# Patient Record
Sex: Female | Born: 1945 | Race: Black or African American | Hispanic: No | Smoking: Current some day smoker
Health system: Southern US, Community
[De-identification: ages and names within clinical notes are randomized; demographics above are authoritative.]

## PROBLEM LIST (undated history)

## (undated) DIAGNOSIS — M1990 Unspecified osteoarthritis, unspecified site: Secondary | ICD-10-CM

## (undated) DIAGNOSIS — I10 Essential (primary) hypertension: Secondary | ICD-10-CM

## (undated) DIAGNOSIS — K219 Gastro-esophageal reflux disease without esophagitis: Secondary | ICD-10-CM

## (undated) DIAGNOSIS — C259 Malignant neoplasm of pancreas, unspecified: Secondary | ICD-10-CM

## (undated) HISTORY — PX: SPINE SURGERY: SHX786

## (undated) HISTORY — PX: BREAST LUMPECTOMY: SHX2

## (undated) HISTORY — DX: Gastro-esophageal reflux disease without esophagitis: K21.9

## (undated) HISTORY — DX: Essential (primary) hypertension: I10

## (undated) HISTORY — PX: DIAGNOSTIC LAPAROSCOPY: SUR761

## (undated) HISTORY — DX: Unspecified osteoarthritis, unspecified site: M19.90

## (undated) HISTORY — PX: COLON SURGERY: SHX602

## (undated) HISTORY — PX: CHOLECYSTECTOMY: SHX55

## (undated) HISTORY — PX: KNEE SURGERY: SHX244

## (undated) HISTORY — PX: BACK SURGERY: SHX140

## (undated) HISTORY — PX: OTHER SURGICAL HISTORY: SHX169

## (undated) HISTORY — PX: CATARACT EXTRACTION: SUR2

## (undated) HISTORY — PX: ABDOMINAL HYSTERECTOMY: SHX81

---

## 2007-07-22 ENCOUNTER — Ambulatory Visit: Admission: RE | Admit: 2007-07-22 | Discharge: 2007-07-22 | Payer: Self-pay | Admitting: Sports Medicine

## 2007-07-22 ENCOUNTER — Encounter (INDEPENDENT_AMBULATORY_CARE_PROVIDER_SITE_OTHER): Payer: Self-pay | Admitting: Sports Medicine

## 2007-07-22 ENCOUNTER — Ambulatory Visit: Payer: Self-pay | Admitting: Vascular Surgery

## 2007-09-01 ENCOUNTER — Ambulatory Visit: Payer: Self-pay | Admitting: Vascular Surgery

## 2008-06-11 ENCOUNTER — Emergency Department (HOSPITAL_COMMUNITY): Admission: EM | Admit: 2008-06-11 | Discharge: 2008-06-12 | Payer: Self-pay | Admitting: Emergency Medicine

## 2008-06-12 ENCOUNTER — Encounter (INDEPENDENT_AMBULATORY_CARE_PROVIDER_SITE_OTHER): Payer: Self-pay | Admitting: Emergency Medicine

## 2008-06-12 ENCOUNTER — Ambulatory Visit: Payer: Self-pay | Admitting: Vascular Surgery

## 2010-05-09 LAB — PROTIME-INR: Prothrombin Time: 18.1 seconds — ABNORMAL HIGH (ref 11.6–15.2)

## 2010-05-09 LAB — CBC
MCHC: 34.8 g/dL (ref 30.0–36.0)
MCV: 93.3 fL (ref 78.0–100.0)
Platelets: 165 10*3/uL (ref 150–400)
RDW: 14.8 % (ref 11.5–15.5)

## 2010-06-13 NOTE — Consult Note (Signed)
NAME:  [name], TEMPESTA NO.:  [id number]   MEDICAL RECORD NO.:  [id number]          PATIENT TYPE:  EMS   LOCATION:  MAJO                         FACILITY:  [facility]   PHYSICIAN:  Pearlean Brownie, M.D.DATE OF BIRTH:  Jun 11, 1945   DATE OF CONSULTATION:  06/12/2008  DATE OF DISCHARGE:                                 CONSULTATION   PRIMARY CARE PHYSICIAN:  The patient was seen at [place] last year in  July by Dr. Hal Hope.  She is also followed by Coumadin clinic.   CHIEF COMPLAINT:  Right lower extremity DVT/edema/tenderness.   HISTORY OF PRESENT ILLNESS:  The patient is a 65 year old female here  with history of DVT and PE who presented with right lower extremity  swelling and tenderness and a new DVT on her Doppler.  The patient says  she is currently on Coumadin and taking 15 mg per day.  The patient says  that she has been taking this at least for 2 weeks, but prior to that  had not been taking this.  Her last Coumadin fill was on March 30, 2008  where she got 90 tablets, 5 mg each, was told to take 3 per day.  This  would have lasted for approximately 1 month.  When the patient was  confronted with this information, she said that she had been taking 50  mg daily for the past 2 weeks everyday.  She was pressed upon this  information.  The patient has not had her INR checked in 2 months.  She  said last time it was checked at the [facility].  According to the patient, it was checked and found to be therapeutic.  The patient says that her [facility] has not been covering her INR  checks there and she needs to find another place to get this checked and  this is the reason that she has not been having her INR checked  regularly.  The patient admits to having pain in her right lower  extremity for approximately 2 days.  She has an increase in swelling  yesterday and she was unable to walk as well, but today she is not able  to walk to the bathroom  after getting oxycodone.  The patient says the  pain is now improved from 9/10 yesterday to 5-6/10 today.  The swelling  has also improved.  She denies fevers.  She says she does have some mild  shortness of breath when walks to the bathroom, has had this for 3-4  days.  Denies any chest pain.  She is coughing a little bit in the  morning for approximately 2-3 weeks and the patient also says that she  has given herself Lovenox in the past and says that she knows how to do  this and would be willing to do this at home when she needed to.  In the  emergency department, the patient was given Tylenol 650 mg, Lovenox 1  mg/kg x1, oxycodone 10 mg x1, Ambien 10 mg x1, doxycycline 100 mg x1.   PAST MEDICAL HISTORY:  1. GERD.  2. DVT in the right thigh 2 years ago in [place].  3. PE approximately 3 years ago in [place].  She was seen at      [facility] for this.  4. Accelerated heart rate.  The patient says she takes Cardizem for      this.  Denies having an arrhythmia.  Denies having any other heart      problems.  5. She was diagnosed with neuropathy in her feet.  She takes Lyrica      for this.  6. Glaucoma and cataracts.  7. Tobacco abuse 1-2 packs per day for 40 years.   PAST SURGICAL HISTORY:  The patient has had a Greenfield filter placed.  Also, has had her gallbladder removed, hysterectomy, and a lump removed  from her left breast as well as other arthroscopic surgeries in her  joints in the past.   ALLERGIES:  No known drug allergies.   MEDICATIONS:  1. Coumadin 15 mg daily.  2. Protonix 40 mg daily.  3. Cardizem CD 120 mg daily.  4. Lyrica 75 mg daily.  5. Travatan 1 drop nightly in each eye.   SOCIAL HISTORY:  The patient lives in [place].  Her daughter is 93  years old.  She has 2 grandchildren here, ages 98 and 57.  She does smoke  1 pack per day currently and has since decreased from 2 packs per day to  1 pack per day.  She is a social drinker.   She is retired from the  [employer], dispensing.   FAMILY HISTORY:  Denies any family history of blood clots.  Mom has  hypertension, died in her 45s of a heart problem, might have had a heart  attack.  Dad, hypertension, diabetes, prostate cancer.  Sister had some  kind of heart problem, also has hypertension and diabetes.  Grandmother,  hypertension and diabetes.   PHYSICAL EXAMINATION:  VITAL SIGNS:  Temperature 97.7, T-max 98.5, heart  rate 65-90, respiratory rate 16-18, blood pressure 109-143/70s-80s,  sating 95% on room air to 97% on room air.  The patient weighs 201.7  pounds.  GENERAL:  Alert, in no acute distress.  She is sitting in bed, reading  comfortably.  HEENT:  Pupils equal, round, reactive to light.  Extraocular muscles  intact.  Oropharynx, no exudates or erythema.  NECK:  No lymphadenopathy.  CARDIOVASCULAR:  Heart regular rate rhythm.  No murmurs, rubs, or  gallops.  PULMONARY:  Lungs are clear to auscultation bilaterally.  ABDOMEN:  Soft, nondistended.  Very mild tenderness in the left  umbilical area.  EXTREMITIES:  Left is nontender.  No edema.  Right tender with erythema  and swelling on the medial side of her lower leg and ankle 2+ dorsalis  pedis pulses bilaterally.  Sensation intact bilaterally.   LABORATORY DATA:  INR 1.4, white blood cell count 5.4, hemoglobin 14.4,  platelets 165, hematocrit 41.3.   STUDIES:  Right lower extremity Dopplers showed right lower extremity  DVT in PTV and a superficial venous thrombosis noted in the GSV in the  calf.   ASSESSMENT AND PLAN:  The patient is a 65 year old female with a history  of DVT and PE with a Greenfield filter, currently on Coumadin, but has  been noncompliant in the past year with another DVT.  In regards to DVT,  this most likely is due to noncompliance on her Coumadin.  She was not  taking this correctly.  The patient did tell  me she has been taking 50  mg per day at least for 2 weeks and I did  pressed her upon this  information and she told me she definitely has been taking it for 2  weeks and I also warned her that she did not tell me the truth it could  mean that she could have a hemorrhage if I give her too much Coumadin.  I did call [facility] in [address].  The patient did last fill her  prescription on March 30, 2008.  She was only given enough tablets at  that point to last 1 month if she took 50 mg per day; however, the  patient says that she was not taking this initially, but has been taking  this for the past 2 weeks.  The patient was given Lovenox 1 mg/kg last  night approximately at midnight, and we will give another dose now prior  to discharge as it is already approximately 2 p.m. and it has been about  12 hours.  We will also give her 20 mg of Coumadin today prior to  discharge as she was subtherapeutic on the 15 mg and should have steady  state by 5 days, so if she were taking it for 50 mg per day for 2 weeks,  she should be at a steady state and 20 mg should be an appropriate dose  for her.  I did give her a prescription for Coumadin for 5 mg tablets #8  to take 4 tablets of it tomorrow and enough for 4 tablets on Monday if  she ends up getting to a physician later in the day; however, I would  prefer the physician to dose this on Monday, and I did tell this to the  patient.  I also given her prescription for Lovenox for approximately 4  doses of 90 mg per dose and Lovenox supplies and a prescription for 20  of Vicodin p.r.n. pain.  The patient has given herself Lovenox before in  the past and does know how to take this.  We will have an RN watch her  give this to herself prior to discharge as she is due for a dose  currently.  She will follow up at 1:15 p.m. at [facility] with Dr. Hal Hope.  I did discuss the situation with Dr. Hal Hope prior to her discharge and  she is fine with her following up with her.  She will have her INR  checked Monday when she sees Dr.  Hal Hope.  The patient was also  instructed to go to the ED if shortness of breath or chest pain.  We  will not get a CT angiogram as the patient is stable on room air and  this will not change her treatment are management.  I did discuss this  with Dr. Deirdre Priest.  The patient has a Greenfield filter in place.  There is nothing that we would do to change her situation if we did find  a mild PE.  The patient was also given doxy prior to having the positive  Dopplers in case of cellulitis, but this does not appear to be the case,  it appears to be the DVT.     ______________________________  Cyndia Bent, M.D      Pearlean Brownie, M.D.  Electronically Signed    JP/MEDQ  D:  06/12/2008  T:  06/13/2008  Job:  [id number]   cc:   [facility]  Dr. Hal Hope

## 2010-06-13 NOTE — Consult Note (Signed)
VASCULAR SURGERY CONSULTATION   [name], [name]  DOB:  [birth year]                                       09/01/2007  CHART#:[id number]   This is a 65 year old female referred for vascular surgery consultation  by Dr. Rodolph Bong for right leg discomfort and a history of deep  venous thrombosis.  She had a pulmonary embolus in January of 2007 when  living in [place] and in March of 2007 developed a deep venous  thrombosis in the right leg.  She was treated with heparin followed by  Coumadin as well as an IVC filter and then moved to [place].  She  has been experiencing discomfort in the right hip and buttock area over  the past 3 months, which radiates into the medial thigh area.  She has  had chronic edema in the right leg and does not wear elastic compression  stockings.  She has no history of stasis ulcers, significant varicose  veins or bleeding.  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  There is no acute DVT noted.  She was  evaluated by Dr. Penni Bombard and he did not see any significant hip  pathology, but felt she may have some lumbar radiculopathy as well as  some mild degenerative changes in the right hip.   PAST MEDICAL HISTORY:  1. Hypertension.  2. Hyperlipidemia.  3. Negative for diabetes, coronary artery disease, COPD or stroke.   PAST SURGICAL HISTORY:  1. Hysterectomy.  2. Cholecystectomy.  3. Right breast biopsy.   FAMILY HISTORY:  Positive for coronary artery disease in her mother and  sister.  Diabetes in multiple family members including father and  sister, and stroke in an aunt.   SOCIAL HISTORY:  She is single, has 3 children, is retired, has smoked  between a half to 1 pack of cigarettes per day for 40+ years.  She  drinks occasional brandy at  night.   REVIEW OF SYSTEMS:  Unremarkable.   ALLERGIES:  None known.   MEDICATIONS:  Include Coumadin, Cardizem, Protonix, and Percocet.   PHYSICAL EXAM:  Blood pressure is 120/70, heart rate 88, respirations  14.  General:  She is a healthy-appearing middle-aged female in no  apparent distress, alert and oriented x3.  Neck is supple, 3+ carotid  pulses palpable.  No bruits are heard.  Neurologic:  Normal.  No  palpable adenopathy in the neck.  Chest:  Clear to auscultation.  Cardiovascular:  Regular rhythm.  No murmurs.  Abdomen:  Soft and  nontender.  No palpable masses.  She has 3+ femoral, popliteal, and  dorsalis pedis pulses bilaterally.  1+ edema in the right leg,  particularly below the knee.  No varicosities, hyperpigmentation, or  ulceration noted.   I do not think her right leg symptoms are secondary to her history of  DVT or her chronic venous changes on her recent ultrasound.  It sounds  more mechanical and related to her lumbar radiculopathy or hip problems.  If her edema is a significant problem, at the present time it does not  seem to be, she could treat this with elevation and some well-fitting  elastic compression stockings.  If I can be of further assistance, I  would be happy to see her in the future.   Quita Skye Hart [place], M.D.  Electronically Signed  JDL/MEDQ  D:  09/01/2007  T:  09/02/2007  Job:  [id number]   cc:   Rodolph Bong, M.D.  Marcos Eke. Hal Hope, M.D.

## 2010-08-04 ENCOUNTER — Other Ambulatory Visit: Payer: Self-pay | Admitting: Emergency Medicine

## 2010-08-04 DIAGNOSIS — M4310 Spondylolisthesis, site unspecified: Secondary | ICD-10-CM

## 2010-11-14 ENCOUNTER — Other Ambulatory Visit (HOSPITAL_COMMUNITY): Payer: Self-pay | Admitting: Specialist

## 2010-11-14 ENCOUNTER — Other Ambulatory Visit: Payer: Self-pay | Admitting: Specialist

## 2010-11-14 ENCOUNTER — Ambulatory Visit (HOSPITAL_COMMUNITY)
Admission: RE | Admit: 2010-11-14 | Discharge: 2010-11-14 | Disposition: A | Discharge status: Home / Self Care | Payer: Federal, State, Local not specified - PPO | Source: Ambulatory Visit | Attending: Specialist | Admitting: Specialist

## 2010-11-14 ENCOUNTER — Encounter (HOSPITAL_COMMUNITY): Payer: Federal, State, Local not specified - PPO

## 2010-11-14 DIAGNOSIS — Z01812 Encounter for preprocedural laboratory examination: Secondary | ICD-10-CM | POA: Insufficient documentation

## 2010-11-14 DIAGNOSIS — Z01811 Encounter for preprocedural respiratory examination: Secondary | ICD-10-CM

## 2010-11-14 DIAGNOSIS — I10 Essential (primary) hypertension: Secondary | ICD-10-CM | POA: Insufficient documentation

## 2010-11-14 DIAGNOSIS — M549 Dorsalgia, unspecified: Secondary | ICD-10-CM | POA: Insufficient documentation

## 2010-11-14 DIAGNOSIS — Z01818 Encounter for other preprocedural examination: Secondary | ICD-10-CM | POA: Insufficient documentation

## 2010-11-14 DIAGNOSIS — M47817 Spondylosis without myelopathy or radiculopathy, lumbosacral region: Secondary | ICD-10-CM | POA: Insufficient documentation

## 2010-11-14 LAB — URINALYSIS, ROUTINE W REFLEX MICROSCOPIC
Ketones, ur: NEGATIVE mg/dL
Leukocytes, UA: NEGATIVE
Nitrite: NEGATIVE
Protein, ur: NEGATIVE mg/dL
Urobilinogen, UA: 1 mg/dL (ref 0.0–1.0)
pH: 6 (ref 5.0–8.0)

## 2010-11-14 LAB — COMPREHENSIVE METABOLIC PANEL
Alkaline Phosphatase: 103 U/L (ref 39–117)
BUN: 10 mg/dL (ref 6–23)
GFR calc Af Amer: >90 mL/min (ref >90)
Glucose, Bld: 84 mg/dL (ref 70–99)
Potassium: 3.9 mEq/L (ref 3.5–5.1)
Total Protein: 8.3 g/dL (ref 6.0–8.3)

## 2010-11-14 LAB — CBC
Hemoglobin: 13.7 g/dL (ref 12.0–15.0)
MCH: 29.7 pg (ref 26.0–34.0)
MCHC: 32.2 g/dL (ref 30.0–36.0)
Platelets: 210 10*3/uL (ref 150–400)

## 2010-11-14 LAB — PROTIME-INR: Prothrombin Time: 18.7 seconds — ABNORMAL HIGH (ref 11.6–15.2)

## 2010-11-14 LAB — URINE MICROSCOPIC-ADD ON

## 2010-11-22 ENCOUNTER — Ambulatory Visit (HOSPITAL_COMMUNITY): Payer: Federal, State, Local not specified - PPO

## 2010-11-22 ENCOUNTER — Other Ambulatory Visit (HOSPITAL_COMMUNITY): Payer: Self-pay | Admitting: Specialist

## 2010-11-22 ENCOUNTER — Inpatient Hospital Stay (HOSPITAL_COMMUNITY)
Admission: RE | Admit: 2010-11-22 | Discharge: 2010-11-27 | DRG: 758 | Disposition: A | Discharge status: Home / Self Care | Payer: Federal, State, Local not specified - PPO | Source: Ambulatory Visit | Attending: Specialist | Admitting: Specialist

## 2010-11-22 DIAGNOSIS — Z01812 Encounter for preprocedural laboratory examination: Secondary | ICD-10-CM

## 2010-11-22 DIAGNOSIS — R34 Anuria and oliguria: Secondary | ICD-10-CM | POA: Diagnosis present

## 2010-11-22 DIAGNOSIS — M48061 Spinal stenosis, lumbar region without neurogenic claudication: Secondary | ICD-10-CM | POA: Diagnosis present

## 2010-11-22 DIAGNOSIS — Q762 Congenital spondylolisthesis: Principal | ICD-10-CM

## 2010-11-22 DIAGNOSIS — Z86718 Personal history of other venous thrombosis and embolism: Secondary | ICD-10-CM

## 2010-11-22 DIAGNOSIS — E785 Hyperlipidemia, unspecified: Secondary | ICD-10-CM | POA: Diagnosis present

## 2010-11-22 DIAGNOSIS — M545 Low back pain, unspecified: Secondary | ICD-10-CM

## 2010-11-22 DIAGNOSIS — Z01818 Encounter for other preprocedural examination: Secondary | ICD-10-CM

## 2010-11-22 DIAGNOSIS — G609 Hereditary and idiopathic neuropathy, unspecified: Secondary | ICD-10-CM | POA: Diagnosis present

## 2010-11-22 DIAGNOSIS — Z6833 Body mass index (BMI) 33.0-33.9, adult: Secondary | ICD-10-CM

## 2010-11-22 DIAGNOSIS — Z7901 Long term (current) use of anticoagulants: Secondary | ICD-10-CM

## 2010-11-22 DIAGNOSIS — I10 Essential (primary) hypertension: Secondary | ICD-10-CM | POA: Diagnosis present

## 2010-11-22 LAB — TYPE AND SCREEN
ABO/RH(D): AB NEG
Antibody Screen: NEGATIVE

## 2010-11-22 LAB — PROTIME-INR
INR: 1.01 (ref 0.00–1.49)
Prothrombin Time: 13.5 seconds (ref 11.6–15.2)

## 2010-11-22 LAB — APTT: aPTT: 36 seconds (ref 24–37)

## 2010-11-23 LAB — CBC
MCH: 29.6 pg (ref 26.0–34.0)
MCHC: 32.3 g/dL (ref 30.0–36.0)
MCV: 91.5 fL (ref 78.0–100.0)
Platelets: 210 10*3/uL (ref 150–400)
RDW: 13 % (ref 11.5–15.5)

## 2010-11-23 LAB — BASIC METABOLIC PANEL
Calcium: 9.1 mg/dL (ref 8.4–10.5)
Creatinine, Ser: 0.65 mg/dL (ref 0.50–1.10)
GFR calc Af Amer: >90 mL/min (ref >90)

## 2010-11-24 LAB — BASIC METABOLIC PANEL
Chloride: 107 mEq/L (ref 96–112)
Creatinine, Ser: 0.73 mg/dL (ref 0.50–1.10)
GFR calc Af Amer: >90 mL/min (ref >90)
GFR calc non Af Amer: 89 mL/min — ABNORMAL LOW (ref >90)
Potassium: 3.7 mEq/L (ref 3.5–5.1)

## 2010-11-24 LAB — CBC
MCHC: 32.5 g/dL (ref 30.0–36.0)
Platelets: 159 10*3/uL (ref 150–400)
RDW: 13.5 % (ref 11.5–15.5)
WBC: 8.2 10*3/uL (ref 4.0–10.5)

## 2010-11-24 NOTE — Op Note (Signed)
NAME:  [name], [name] NO.:  [id number]  MEDICAL RECORD NO.:  [id number]  LOCATION:  1501                         FACILITY:  [facility]  PHYSICIAN:  Jene Every, M.D.    DATE OF BIRTH:  12/11/1945  DATE OF PROCEDURE:  11/22/2010 DATE OF DISCHARGE:                              OPERATIVE REPORT   PREOPERATIVE DIAGNOSIS:  Spondylolisthesis and spinal stenosis at L4-L5.  POSTOPERATIVE DIAGNOSIS:  Spondylolisthesis and spinal stenosis at L4- L5.  PROCEDURES PERFORMED:  Laminectomy at L4 with lumbar decompression, L4- L5 and L3-L4; and foraminotomies of L4 and L5 bilaterally.  ANESTHESIA:  General.  ASSISTANT:  Roma Schanz, P.A.  BRIEF HISTORY:  This is a 65 year old with bilateral lower extremity radicular pain predominantly with central lamina recess stenosis, has minimal listhesis at L4-L5 without instability on flexion, extension. The patient is fairly obese and also has a history of DVT and PE, has a Greenfield filter.  She came off her Coumadin, was on Lovenox until yesterday, and was indicated for decompression due to severe intractable bilateral lower extremity radicular pain.  We discussed decompression, L4-L5, possible lateral mass fusion depending upon intraoperative signs of instability, although we did indicate the additional problem of diminishing blood loss as well as operative time due to her history of DVT, PE, and the need for anticoagulation.  The patient also had difficulty increased due to her morbid obesity.  TECHNIQUE:  The patient in supine position, after induction of adequate general anesthesia 2 g Kefzol, she was placed prone on the Terry frame.  All bony prominences were well padded.  __________ less than that.  Stockings and TEDs were applied.  The area was freed.  18-gauge spinal needles were utilized to localize the L4-L5 interspace, confirmed with x-ray.  Incision was made from spinous process of 3 to below 5. Subcutaneous  tissue was dissected __________ .  Due to significantly ample subcutaneous adipose tissue, we had to use the extra long McCullough retractors.  Operating microscope was draped along with the surgical field.  Dorsolumbar fascia was identified developing on the skin incision, paraspinous muscle elevated from lamina of 4 and 5, and partially from 3.  Confirmatory radiograph was obtained with focus on the spinous processes of 4 and 5.  We were at the spinous process of L4. We used Gelfoam, bone wax, and FloSeal to control intraoperative bleeding.  She had some increased bleeding on admission, this was controlled with the above-mentioned as well as electrocautery, and maintaining optimal blood pressure.  After the operating microscope was draped along with the surgical field, we performed a laminectomy at L4 removing the entire lamina of L4.  There were severe lateral recess stenosis bilaterally, right greater than left.  Decompressed lateral recess to the medial border of the pedicle preserving the shaft.  We performed foraminotomies at L5 and L4 preserving the pars as well.  Then removed ligamentum flavum from the interspace at L3-L4 with significant hypertrophic ligamentum flavum noted as well there and with decompressed lateral recess to the medial border of the pedicle.  Hockey-stick probe passed freely up, 3.5 bilaterally and cephalad.  Intraoperative radiographs of the disk space at 4 and 5 indicated no  increase in listhesis following the decompression.  We also examined the disk herniation noted on the MRI.  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  We examined the disk bilaterally at L4-5.  There was no disk herniation compressing the nerve root.  In an effort to preserve the disc as well as to diminish the risk of listhesis we left the disc, there was a hardened portion of the disk, at the nerve roots at 4,5, and 3.  After meticulous hemostasis and irrigation, removed  the McCullough retractor.  Paraspinous muscles were inspected, no active bleeding. Placed thrombin-soaked Gelfoam on the laminotomy defect after copious irrigation, and FloSeal as well.  This was outside the laminotomy defect.  The dorsolumbar fascia was reapproximated with #1 Vicryl and figure-of-eight sutures, subcu with 0 and 2-0 Vicryl simple sutures, skin was reapproximated with staples.  The wound was dressed sterilely. She was placed supine on the hospital bed, extubated without difficulty and transported to the recovery room in a satisfactory condition.  The patient tolerated the procedure well.  There were no complications. Blood loss was less than 50 cc.  Roma Schanz was utilized for gentle intermittent neural retraction and assistance with closure.     Jene Every, M.D.     Cordelia Pen  D:  11/22/2010  T:  11/22/2010  Job:  [id number]  Electronically Signed by Jene Every M.D. on 11/24/2010 05:22:27 PM

## 2010-11-25 LAB — PROTIME-INR: Prothrombin Time: 14 seconds (ref 11.6–15.2)

## 2010-11-26 LAB — PROTIME-INR: Prothrombin Time: 14 seconds (ref 11.6–15.2)

## 2010-11-27 LAB — PROTIME-INR: INR: 1.08 (ref 0.00–1.49)

## 2010-12-15 NOTE — H&P (Signed)
NAME:  [name], DECAPRIO NO.:  MEDICAL RECORD NO.:  [id number]  LOCATION:                                 FACILITY:  PHYSICIAN:  Jene Every, M.D.    DATE OF BIRTH:  09/30/1945  DATE OF ADMISSION: DATE OF DISCHARGE:                             HISTORY & PHYSICAL   NOVEMBER 15, 2012CHIEF COMPLAINT:  Back and lower extremity pain.  HISTORY:  [name] was well known to our practice.  She presents to our office with symptoms of neurogenic claudication.  Imaging studies do reveal severe spinal stenosis.  Unfortunately, the patient failed conservative treatment.  So at this time, she would benefit from a lumbar decompression.  We did obtain medical clearance due to the patient has previous history of DVT as well as PE.  This is obtained through her cardiologist as well as primary care physician.  PAST MEDICAL HISTORY:  For hypertension, hyperlipidemia, DVT, PE on chronic Coumadin, status post IVC filter placement, chronic back pain, and GERD.  FAMILY HISTORY:  Significant for coronary artery disease in her mother and father, as well as hypertension and diabetes runs in the family.  SOCIAL HISTORY:  The patient is divorced.  With 3 children, sister, and grandchildren.  She does not exercise on a regular basis.  She is a former smoker, about a pack per day for 20 to 30 years; however, quit 1 month ago, and currently on Chantix.  History negative for alcohol or street drugs.  ALLERGIES:  No known drug allergies noted.  MEDICATIONS:  Include Coumadin, Protonix, Cardizem, atorvastatin, Vicodin p.r.n., Chantix.  REVIEW OF SYSTEMS:  10-point review of systems is significant for joint and muscle pain, occasional shortness of breath, and swelling in the feet.  PHYSICAL EXAMINATION:  VITAL SIGNS:  Weight is 220, pulse 78, blood pressure 124/90, height 5 feet 9 inches. GENERAL: The patient is in no acute distress. HEENT:  Atraumatic, normocephalic.  Pupils  equal, reactive to light. EOMs intact. NECK:  Supple.  No lymphadenopathy.  CHEST:  Clear to auscultation bilaterally. HEART:  Regular rate and rhythm. ABDOMEN:  Soft, nontender.  Bowel sounds x4. SKIN:  No rashes or lesions are noted in regard to lower extremities. NEUROLOGIC:  She has negative straight leg raise bilaterally.  Sensation is intact.  She has a slight motor weakness, which is global.  Calves soft, nontender with no evidence of DVT.  ASSESSMENT AND PLAN:  Severe stenosis at L4-L5.  PLAN:  The patient will be admitted to undergo a lumbar decompression, possibly L3-L4 as well as L4-L5.     Roma Schanz, P.A.   ______________________________ Jene Every, M.D.    CS/MEDQ  D:  12/14/2010  T:  12/14/2010  Job:  [id number]

## 2010-12-18 NOTE — Discharge Summary (Signed)
[name], [name] NO.:  [id number]  MEDICAL RECORD NO.:  [id number]  LOCATION:  1501                         FACILITY:  [facility]  PHYSICIAN:  Roma Schanz, P.A.DATE OF BIRTH:  1945/12/01  DATE OF ADMISSION:  11/22/2010 DATE OF DISCHARGE:  11/27/2010                              DISCHARGE SUMMARY   ADMISSION DIAGNOSES:  Severe spinal stenosis at L4-5, history of deep venous thrombosis, pulmonary embolism, hypertension, hypercholesteremia.  DISCHARGE DIAGNOSES:  Severe spinal stenosis at L4-5, history of deep venous thrombosis, pulmonary embolism, hypertension, hypercholesteremia, status post lumbar decompression at L4-L5.  HISTORY:  [name] is a pleasant 65 year old female with complaints of bilateral lower extremity pain, worse with standing and walking. Studies do reveal severe stenosis.  So it was thought that the patient would benefit from a lumbar decompression.  The risks and benefits of the surgery were discussed with the patient.  She was bridged with Lovenox, coming off her Coumadin.  The patient does elect to proceed with the surgery.  PROCEDURE:  The patient was taken to the OR, underwent lumbar decompression at L4-L5 bilaterally.  SURGEON:  Jene Every, M.D.  ASSISTANT:  Roma Schanz, PAC.  ANESTHESIA:  General.  COMPLICATIONS:  None.  CONSULTS:  PT/OT Care Management.  LABORATORY DATA:  Admission labs showed white cell count 8.1, hemoglobin 11.8, hematocrit 36.5.  This was repeated postoperatively with white cell count 8.2, hemoglobin 10.5, hematocrit 32.3.  Coagulation studies were obtained while in the hospital.  Mainly postoperatively INR 1.01. The patient was started back on her Coumadin.  At time of discharge, INR remained at 1.08; however, she was bridged with Lovenox.  Routine chemistries were within normal range.  GFR was in normal range as well. Blood type is AB negative.  HOSPITAL COURSE:  The patient was  admitted, taken to the OR and underwent the above-stated procedure without difficulty.  She was then transferred to the PACU and then to the orthopedic floor for continued postoperative care.  Due to her history of DVT/PE, Dr. Shelle Iron wanted to keep the patient in the hospital while titrating back up on her Coumadin.  She will need to be at least 72 hours out from surgery to reassume Lovenox.  Therefore, she was monitored closely.  In regard to her back, the patient did note improvement in symptoms, decreased lower extremity pain, low back pain as expected.  She was having significant issues with voiding.  Foley catheter had to be replaced.  However, prior to discharge, she was voiding without significant difficulty, passing flatus.  Postop day #5, at this time the patient was stable to be discharged home.  She will be discharged with her Coumadin bridged with Lovenox until she is therapeutic.  She has a followup appointment tomorrow with her cardiologist in the Coumadin Clinic.  She is to follow up with Dr. Shelle Iron in approximately  10-14 days for suture removal.  ACTIVITY:  She is to walk as tolerated utilizing proper back precautions.  MEDICATIONS:  As per med rec sheet.  DIET:  As tolerated.  CONDITION ON DISCHARGE:  Stable.  FINAL DIAGNOSIS:  Doing well status post lumbar decompression, L4-L5.     Roma Schanz,  P.A.     CS/MEDQ  D:  12/18/2010  T:  12/18/2010  Job:  [id number]

## 2011-02-19 ENCOUNTER — Ambulatory Visit (INDEPENDENT_AMBULATORY_CARE_PROVIDER_SITE_OTHER): Payer: Federal, State, Local not specified - PPO

## 2011-02-19 DIAGNOSIS — N390 Urinary tract infection, site not specified: Secondary | ICD-10-CM

## 2011-02-27 ENCOUNTER — Telehealth: Payer: Self-pay

## 2011-02-27 NOTE — Telephone Encounter (Signed)
.  [facility] Pt states she needs to speak with S.Weber regarding recent ov. States she has finished all the rx for her bladder infection, but has noticed blood in her urine recently. Pt concerned. Please call [facility]: [phone]  bf

## 2011-02-28 ENCOUNTER — Telehealth: Payer: Self-pay

## 2011-02-28 NOTE — Telephone Encounter (Signed)
Spoke with pt and gave her instructions from Maralyn Sago that she should RTC for re-eval. Pt agreed but said she doesn't have the co-pay right now. Told pt that we could set up payment plan for her if needed (OKd by Luis Abed). Pt will RTC tomorrow.

## 2011-02-28 NOTE — Telephone Encounter (Signed)
Spoke with pt and advised to RTC. See note on previous phone message.

## 2011-02-28 NOTE — Telephone Encounter (Signed)
.  [facility] Elorah C STATES HER MOM HAVE BEEN WAITING ON A CALL REGARDING HER ILLNESS. SHE IS LEAVING GOING TO ANOTHER DR'S APPT AND WANT [place] TO LEAVE A MESSAGE ON HER MACHINE BUT WOULD MUCH PREFER WE CALL IN ANOTHER ANTIBOTIC. YOU MAY CALL HER AT [phone] AND THE PHARMACY IS [facility] ON [address]

## 2011-03-01 ENCOUNTER — Ambulatory Visit (INDEPENDENT_AMBULATORY_CARE_PROVIDER_SITE_OTHER): Payer: Federal, State, Local not specified - PPO | Admitting: Emergency Medicine

## 2011-03-01 VITALS — BP 95/67 | HR 111 | Temp 99.0°F | Resp 16 | Ht 68.0 in | Wt 217.0 lb

## 2011-03-01 DIAGNOSIS — Z86711 Personal history of pulmonary embolism: Secondary | ICD-10-CM

## 2011-03-01 DIAGNOSIS — IMO0001 Reserved for inherently not codable concepts without codable children: Secondary | ICD-10-CM

## 2011-03-01 DIAGNOSIS — I10 Essential (primary) hypertension: Secondary | ICD-10-CM

## 2011-03-01 DIAGNOSIS — Z79899 Other long term (current) drug therapy: Secondary | ICD-10-CM

## 2011-03-01 DIAGNOSIS — R319 Hematuria, unspecified: Secondary | ICD-10-CM

## 2011-03-01 DIAGNOSIS — N390 Urinary tract infection, site not specified: Secondary | ICD-10-CM

## 2011-03-01 DIAGNOSIS — E7800 Pure hypercholesterolemia, unspecified: Secondary | ICD-10-CM

## 2011-03-01 DIAGNOSIS — R350 Frequency of micturition: Secondary | ICD-10-CM

## 2011-03-01 DIAGNOSIS — K219 Gastro-esophageal reflux disease without esophagitis: Secondary | ICD-10-CM

## 2011-03-01 DIAGNOSIS — Z86718 Personal history of other venous thrombosis and embolism: Secondary | ICD-10-CM

## 2011-03-01 LAB — POCT URINALYSIS DIPSTICK
Bilirubin, UA: NEGATIVE
Nitrite, UA: NEGATIVE
Protein, UA: 100
pH, UA: 6

## 2011-03-01 LAB — POCT WET PREP WITH KOH: Clue Cells Wet Prep HPF POC: NEGATIVE

## 2011-03-01 LAB — POCT UA - MICROSCOPIC ONLY
Casts, Ur, LPF, POC: NEGATIVE
Yeast, UA: NEGATIVE

## 2011-03-01 MED ORDER — CEPHALEXIN 500 MG PO CAPS: 500.0000 mg | ORAL_CAPSULE | Freq: Three times a day (TID) | ORAL | Status: AC

## 2011-03-01 NOTE — Progress Notes (Signed)
  Subjective:    Patient ID: [name], female    DOB: 12-25-1945, 66 y.o.   MRN: [id number]  Urinary Frequency  This is a new problem. The current episode started 1 to 4 weeks ago. The problem occurs every urination. The problem has been gradually worsening (Pt was seen Jan 21 for UTI with Neg Urine Cx.  Symptoms improved after 7d of Macrobid but then started to worsen after Abx were finished.  ). Quality: no pain - just frequency and and urgency. The patient is experiencing no pain. There has been no fever. There is no history of pyelonephritis. Associated symptoms include a discharge (? but no feelings of vaginal dryness), frequency, hematuria (a clot passed 2 days ago but none since), hesitancy and urgency. Pertinent negatives include no chills, nausea or vomiting. Her past medical history is significant for urinary stasis (only with surgery). There is no history of recurrent UTIs.      Review of Systems  Constitutional: Negative for chills.  Gastrointestinal: Positive for abdominal pain and constipation (Has had a few soft stools after OTC meds but today hard balls of stool.  Knows abdominal discomfort is related  to constipation.). Negative for nausea and vomiting.  Genitourinary: Positive for hesitancy, urgency, frequency, hematuria (a clot passed 2 days ago but none since) and difficulty urinating. Negative for dysuria.       Objective:   Physical Exam  Constitutional: She is oriented to person, place, and time. She appears well-developed and well-nourished.  HENT:  Head: Normocephalic and atraumatic.  Right Ear: External ear normal.  Left Ear: External ear normal.  Nose: Nose normal.  Eyes: Conjunctivae are normal.  Cardiovascular: Normal rate, regular rhythm and normal heart sounds.  Exam reveals no gallop and no friction rub.   No murmur heard. Abdominal: Soft. She exhibits no distension. There is no tenderness. There is no rebound and no CVA tenderness.  Genitourinary:  Vagina normal. Pelvic exam was performed with patient supine.  Neurological: She is alert and oriented to person, place, and time.  Skin: Skin is warm and dry.  Psychiatric: She has a normal mood and affect. Her behavior is normal. Judgment and thought content normal.    Vaginal canal is atrophy and pale.      Assessment & Plan:   1. UTI (lower urinary tract infection)  Urine culture, cephALEXin (KEFLEX) 500 MG capsule  2. Hematuria, unspecified  POCT Urinalysis Dipstick, POCT UA - Microscopic Only, Urine culture  3. Urinary frequency  POCT Wet Prep with KOH, Urine culture  4. History of DVT (deep vein thrombosis)  INR/PT, INR/PT  5. High risk medication use  INR/PT, INR/PT   Pt will RTC on Monday for PT/INR for monitoring. Push fluids. Use OTC meds for constipation.

## 2011-03-01 NOTE — Patient Instructions (Signed)
Push fluids. OTC meds for constipation.

## 2011-03-02 ENCOUNTER — Telehealth: Payer: Self-pay | Admitting: Emergency Medicine

## 2011-03-02 LAB — PROTIME-INR: Prothrombin Time: 37.6 seconds — ABNORMAL HIGH (ref 11.6–15.2)

## 2011-03-02 NOTE — Telephone Encounter (Signed)
I told patient not to take coumadin until recheck on Monday

## 2011-03-03 LAB — URINE CULTURE: Organism ID, Bacteria: NO GROWTH

## 2011-03-05 ENCOUNTER — Ambulatory Visit: Payer: Federal, State, Local not specified - PPO

## 2011-03-05 ENCOUNTER — Other Ambulatory Visit (INDEPENDENT_AMBULATORY_CARE_PROVIDER_SITE_OTHER): Payer: Federal, State, Local not specified - PPO | Admitting: Internal Medicine

## 2011-03-05 DIAGNOSIS — Z79899 Other long term (current) drug therapy: Secondary | ICD-10-CM

## 2011-03-05 DIAGNOSIS — Z86718 Personal history of other venous thrombosis and embolism: Secondary | ICD-10-CM

## 2011-03-05 LAB — PROTIME-INR
INR: 1.66 — ABNORMAL HIGH (ref <1.50)
Prothrombin Time: 20.2 seconds — ABNORMAL HIGH (ref 11.6–15.2)

## 2011-03-07 ENCOUNTER — Telehealth: Payer: Self-pay | Admitting: Physician Assistant

## 2011-03-07 NOTE — Telephone Encounter (Signed)
Spoke with pt - feeling better with urinary symptoms - will start on Coumadin 10mg  qd and call [facility] tomorrow to schedule PT/INR next test.  Will call back if urinary symptoms return for urology referral.

## 2011-03-12 ENCOUNTER — Other Ambulatory Visit: Payer: Self-pay

## 2011-03-12 MED ORDER — DILTIAZEM HCL ER COATED BEADS 120 MG PO CP24: 120.0000 mg | ORAL_CAPSULE | Freq: Every day | ORAL | Status: DC

## 2011-03-12 MED ORDER — PANTOPRAZOLE SODIUM 40 MG PO TBEC: 40.0000 mg | DELAYED_RELEASE_TABLET | Freq: Every day | ORAL | Status: DC

## 2011-03-12 MED ORDER — ATORVASTATIN CALCIUM 40 MG PO TABS: 40.0000 mg | ORAL_TABLET | Freq: Every day | ORAL | Status: DC

## 2011-03-14 ENCOUNTER — Telehealth: Payer: Self-pay

## 2011-03-14 NOTE — Telephone Encounter (Signed)
.  [facility] PT WENT TO PICK UP HER PANTOPRAZOLE AND IT WAS ONLY 15 PILLS WHEN IT SHOULD HAVE BEEN 30. WAS TOLD SHE NEEDED AN OV, BUT SHE HAVE ALREADY BEEN IN TWICE LAST WEEK AND DOESN'T UNDERSTAND. PLEASE CALL [phone]     [facility] ON [address]

## 2011-03-15 NOTE — Telephone Encounter (Signed)
Dr Cleta Alberts, pt just saw you for acute problems, but not about GI problems or Med RFs. Do you want to RF her pantoprazole, and how many, or do you need to see her first?

## 2011-03-16 ENCOUNTER — Other Ambulatory Visit: Payer: Self-pay | Admitting: Family Medicine

## 2011-03-16 DIAGNOSIS — I10 Essential (primary) hypertension: Secondary | ICD-10-CM

## 2011-03-16 DIAGNOSIS — E785 Hyperlipidemia, unspecified: Secondary | ICD-10-CM

## 2011-03-16 DIAGNOSIS — K219 Gastro-esophageal reflux disease without esophagitis: Secondary | ICD-10-CM

## 2011-03-16 MED ORDER — DILTIAZEM HCL ER COATED BEADS 120 MG PO CP24: 120.0000 mg | ORAL_CAPSULE | Freq: Every day | ORAL | Status: DC

## 2011-03-16 MED ORDER — PANTOPRAZOLE SODIUM 40 MG PO TBEC: 40.0000 mg | DELAYED_RELEASE_TABLET | Freq: Every day | ORAL | Status: DC

## 2011-03-16 MED ORDER — PREGABALIN 75 MG PO CAPS
75.0000 mg | ORAL_CAPSULE | Freq: Two times a day (BID) | ORAL | Status: DC
Start: 2011-03-16 — End: 2011-06-20

## 2011-03-16 MED ORDER — ATORVASTATIN CALCIUM 40 MG PO TABS: 40.0000 mg | ORAL_TABLET | Freq: Every day | ORAL | Status: DC

## 2011-03-16 NOTE — Telephone Encounter (Signed)
Dr Hal Hope had Rx'd Lipitor and Cartia earlier along with protonix, which had already been ordered. Pt needed the Lyrica Rx also. Checked with Luis Abed and Rx'd the Lyrica with same # of RFs as on others Dr Hal Hope ordered.

## 2011-03-16 NOTE — Telephone Encounter (Signed)
Addended by: Launa Flight B on: 03/16/2011 06:18 PM   Modules accepted: Orders

## 2011-03-16 NOTE — Telephone Encounter (Signed)
Barbara, Can we pull her chart?  I will be in the office in the next hour.  I believe refilling her medications will be fine. Thanks you.

## 2011-03-16 NOTE — Telephone Encounter (Signed)
Dr Hal Hope, Dr Cleta Alberts has already OK'd RFs of pts protonix, but pt wanted me to check with you on her other meds that we write for: Lipitor 40 QD, cartia xt 120 QD, and lyrica 75 BID. She was in to get her CPE and then presurg check up with you, and wants to know if she can have RFs on these other meds as well for more than one month, and when you need to see her back. She is on limited income, and has had to pay copays on acute visits twice lately. She keeps having to wait on pharmacy to request RFs and then we only give her 1 mos at a time. She is very confused, frustrated and having trouble managing having to call on each one every time. If she has to she can come in maybe next month but already owes [place] $50. Pt prefers Rx written for 30 -days at a time.

## 2011-03-16 NOTE — Telephone Encounter (Signed)
Please notify [name] it is fine to go ahead and refill the Protonix. She can have 30 refill x1 year he

## 2011-03-16 NOTE — Progress Notes (Signed)
Refilled meds but pt must have ov in 3 months

## 2011-03-17 ENCOUNTER — Telehealth: Payer: Self-pay

## 2011-03-17 NOTE — Telephone Encounter (Signed)
Called pt and informed rxs sent in and to f/u up in 3 months. Pt understood

## 2011-04-12 ENCOUNTER — Other Ambulatory Visit: Payer: Self-pay | Admitting: Internal Medicine

## 2011-04-13 ENCOUNTER — Other Ambulatory Visit: Payer: Self-pay | Admitting: Internal Medicine

## 2011-06-20 ENCOUNTER — Other Ambulatory Visit: Payer: Self-pay | Admitting: Physician Assistant

## 2011-12-19 ENCOUNTER — Ambulatory Visit (INDEPENDENT_AMBULATORY_CARE_PROVIDER_SITE_OTHER): Payer: Medicare Other | Admitting: Emergency Medicine

## 2011-12-19 VITALS — BP 128/84 | HR 120 | Temp 97.9°F | Resp 16 | Ht 69.0 in | Wt 214.0 lb

## 2011-12-19 DIAGNOSIS — N764 Abscess of vulva: Secondary | ICD-10-CM

## 2011-12-19 DIAGNOSIS — I10 Essential (primary) hypertension: Secondary | ICD-10-CM

## 2011-12-19 DIAGNOSIS — E785 Hyperlipidemia, unspecified: Secondary | ICD-10-CM

## 2011-12-19 DIAGNOSIS — E782 Mixed hyperlipidemia: Secondary | ICD-10-CM

## 2011-12-19 DIAGNOSIS — K219 Gastro-esophageal reflux disease without esophagitis: Secondary | ICD-10-CM

## 2011-12-19 LAB — LIPID PANEL
Cholesterol: 309 mg/dL — ABNORMAL HIGH (ref 0–200)
Total CHOL/HDL Ratio: 5.4 Ratio
VLDL: 33 mg/dL (ref 0–40)

## 2011-12-19 LAB — COMPREHENSIVE METABOLIC PANEL
ALT: 12 U/L (ref 0–35)
AST: 28 U/L (ref 0–37)
Creat: 0.76 mg/dL (ref 0.50–1.10)
Total Bilirubin: 0.6 mg/dL (ref 0.3–1.2)

## 2011-12-19 MED ORDER — SULFAMETHOXAZOLE-TRIMETHOPRIM 800-160 MG PO TABS: 1.0000 | ORAL_TABLET | Freq: Two times a day (BID) | ORAL | Status: DC

## 2011-12-19 MED ORDER — DILTIAZEM HCL ER COATED BEADS 120 MG PO CP24: 120.0000 mg | ORAL_CAPSULE | Freq: Every day | ORAL | Status: DC

## 2011-12-19 MED ORDER — PANTOPRAZOLE SODIUM 40 MG PO TBEC: 40.0000 mg | DELAYED_RELEASE_TABLET | Freq: Every day | ORAL | Status: DC

## 2011-12-19 MED ORDER — PREGABALIN 75 MG PO CAPS: 75.0000 mg | ORAL_CAPSULE | Freq: Two times a day (BID) | ORAL | Status: DC

## 2011-12-19 MED ORDER — CLINDAMYCIN HCL 300 MG PO CAPS: 300.0000 mg | ORAL_CAPSULE | Freq: Three times a day (TID) | ORAL | Status: DC

## 2011-12-19 NOTE — Progress Notes (Signed)
[facility] [address] [place] [address] [phone]  Date:  12/19/2011   Name:  [name]   DOB:  [birth year]   MRN:  [id number]  PCP:  Dois Davenport., MD    Chief Complaint: Mass   History of Present Illness:  [name] is a 66 y.o. very pleasant female patient who presents with the following:  Noticed a tender mass on her vagina on Saturday.  Since it has enlarged somewhat.  She has no vaginal drainage and no discharge from the mass.  No fever or chills, no GU or GI or GYN symptoms associated with the mass.  Patient Active Problem List  Diagnosis  . History of DVT (deep vein thrombosis)  . Hx pulmonary embolism  . HTN (hypertension)  . Hypercholesterolemia  . Reflux    Past Medical History  Diagnosis Date  . Arthritis   . Glaucoma   . Clotting disorder     Past Surgical History  Procedure Date  . Cholecystectomy   . Spine surgery   . Abdominal hysterectomy     History  Substance Use Topics  . Smoking status: Current Every Day Smoker    Last Attempt to Quit: 06/30/2010  . Smokeless tobacco: Not on file  . Alcohol Use: Not on file    History reviewed. No pertinent family history.  No Known Allergies  Medication list has been reviewed and updated.  Current Outpatient Prescriptions on File Prior to Visit  Medication Sig Dispense Refill  . atorvastatin (LIPITOR) 40 MG tablet Take 1 tablet (40 mg total) by mouth daily.  30 tablet  3  . diltiazem (CARTIA XT) 120 MG 24 hr capsule Take 1 capsule (120 mg total) by mouth daily.  30 capsule  3  . HYDROcodone-acetaminophen (NORCO) 5-325 MG per tablet Take 1 tablet by mouth every 6 (six) hours as needed.      Marland Kitchen LYRICA 75 MG capsule TAKE 1 CAPSULE BY MOUTH TWICE DAILY  60 capsule  0  . pantoprazole (PROTONIX) 40 MG tablet Take 1 tablet (40 mg total) by mouth daily.  30 tablet  3  . warfarin (COUMADIN) 10 MG tablet Take 10 mg by mouth daily.        Review of  Systems:  As per HPI, otherwise negative.    Physical Examination: Filed Vitals:   12/19/11 1238  BP: 128/84  Pulse: 120  Temp: 97.9 F (36.6 C)  Resp: 16   Filed Vitals:   12/19/11 1238  Height: 5\' 9"  (1.753 m)  Weight: 214 lb (97.07 kg)   Body mass index is 31.60 kg/(m^2). Ideal Body Weight: Weight in (lb) to have BMI = 25: 168.9    GEN: WDWN, NAD, Non-toxic, Alert & Oriented x 3 HEENT: Atraumatic, Normocephalic.  Ears and Nose: No external deformity. EXTR: No clubbing/cyanosis/edema NEURO: Normal gait.  PSYCH: Normally interactive. Conversant. Not depressed or anxious appearing.  Calm demeanor.  EXTERNALGENITALIA:  Abscess left labia majorum laterally.  Assessment and Plan: Abscess labia Culture Septra ds Clindamycin Declined I&D Warm compresses Follow up two days  Carmelina Dane, MD

## 2011-12-21 MED ORDER — ATORVASTATIN CALCIUM 40 MG PO TABS: 40.0000 mg | ORAL_TABLET | Freq: Every day | ORAL | Status: DC

## 2011-12-21 NOTE — Addendum Note (Signed)
Addended by: Carmelina Dane on: 12/21/2011 02:58 PM   Modules accepted: Orders

## 2011-12-22 LAB — WOUND CULTURE

## 2011-12-28 ENCOUNTER — Ambulatory Visit (INDEPENDENT_AMBULATORY_CARE_PROVIDER_SITE_OTHER): Payer: Medicare Other | Admitting: Emergency Medicine

## 2011-12-28 VITALS — BP 128/79 | HR 97 | Temp 97.7°F | Resp 16 | Ht 68.0 in | Wt 220.0 lb

## 2011-12-28 DIAGNOSIS — N764 Abscess of vulva: Secondary | ICD-10-CM

## 2011-12-28 MED ORDER — SULFAMETHOXAZOLE-TRIMETHOPRIM 800-160 MG PO TABS: 1.0000 | ORAL_TABLET | Freq: Two times a day (BID) | ORAL | Status: DC

## 2011-12-28 MED ORDER — CLINDAMYCIN HCL 300 MG PO CAPS: 300.0000 mg | ORAL_CAPSULE | Freq: Three times a day (TID) | ORAL | Status: DC

## 2011-12-28 NOTE — Progress Notes (Signed)
[facility] [address] [place] [address] [phone]  Date:  12/28/2011   Name:  [name]   DOB:  04-08-1945   MRN:  [id number]  PCP:  Dois Davenport., MD    Chief Complaint: Follow-up   History of Present Illness:  [name] is a 66 y.o. very pleasant female patient who presents with the following:  Patient was seen with an abscess in her left labia majora.  Has responded nicely to antibiotics and warm soaks.  No fever or chills.  Had some drainage two days ago.    Patient Active Problem List  Diagnosis  . History of DVT (deep vein thrombosis)  . Hx pulmonary embolism  . HTN (hypertension)  . Hypercholesterolemia  . Reflux    Past Medical History  Diagnosis Date  . Arthritis   . Glaucoma   . Clotting disorder     Past Surgical History  Procedure Date  . Cholecystectomy   . Spine surgery   . Abdominal hysterectomy     History  Substance Use Topics  . Smoking status: Current Every Day Smoker    Last Attempt to Quit: 06/30/2010  . Smokeless tobacco: Not on file  . Alcohol Use: Not on file    No family history on file.  No Known Allergies  Medication list has been reviewed and updated.  Current Outpatient Prescriptions on File Prior to Visit  Medication Sig Dispense Refill  . atorvastatin (LIPITOR) 40 MG tablet Take 1 tablet (40 mg total) by mouth daily.  30 tablet  3  . clindamycin (CLEOCIN) 300 MG capsule Take 1 capsule (300 mg total) by mouth 3 (three) times daily.  30 capsule  0  . diltiazem (CARTIA XT) 120 MG 24 hr capsule Take 1 capsule (120 mg total) by mouth daily.  30 capsule  3  . HYDROcodone-acetaminophen (NORCO) 5-325 MG per tablet Take 1 tablet by mouth every 6 (six) hours as needed.      . pantoprazole (PROTONIX) 40 MG tablet Take 1 tablet (40 mg total) by mouth daily.  30 tablet  3  . pregabalin (LYRICA) 75 MG capsule Take 1 capsule (75 mg total) by mouth 2 (two) times daily.  60 capsule  1  .  sulfamethoxazole-trimethoprim (BACTRIM DS,SEPTRA DS) 800-160 MG per tablet Take 1 tablet by mouth 2 (two) times daily.  20 tablet  0  . warfarin (COUMADIN) 10 MG tablet Take 10 mg by mouth daily.        Review of Systems:  As per HPI, otherwise negative.    Physical Examination: Filed Vitals:   12/28/11 1059  BP: 128/79  Pulse: 97  Temp: 97.7 F (36.5 C)  Resp: 16   Filed Vitals:   12/28/11 1059  Height: 5\' 8"  (1.727 m)  Weight: 220 lb (99.791 kg)   Body mass index is 33.45 kg/(m^2). Ideal Body Weight: Weight in (lb) to have BMI = 25: 164.1    GEN: WDWN, NAD, Non-toxic, Alert & Oriented x 3 HEENT: Atraumatic, Normocephalic.  Ears and Nose: No external deformity. EXTR: No clubbing/cyanosis/edema NEURO: Normal gait.  PSYCH: Normally interactive. Conversant. Not depressed or anxious appearing.  Calm demeanor.  GENITALIA:  Much smaller left labia mass.  Not fluctuant.  No drainage  Assessment and Plan: Labial abscess Continue soaks and antibiotics Follow up as needed  Carmelina Dane, MD

## 2012-07-06 ENCOUNTER — Telehealth: Payer: Self-pay

## 2012-07-06 NOTE — Telephone Encounter (Signed)
Pt needs her pregabalin (LYRICA) 75 MG capsule [facility] - [address]   [phone]

## 2012-07-07 NOTE — Telephone Encounter (Signed)
OK.  Will you order it?

## 2012-07-08 MED ORDER — PREGABALIN 75 MG PO CAPS: 75.0000 mg | ORAL_CAPSULE | Freq: Two times a day (BID) | ORAL | Status: DC

## 2012-07-08 NOTE — Telephone Encounter (Signed)
Called in Rx. Pt notified. 

## 2012-08-07 ENCOUNTER — Encounter: Payer: Self-pay | Admitting: Family Medicine

## 2012-08-07 ENCOUNTER — Ambulatory Visit (INDEPENDENT_AMBULATORY_CARE_PROVIDER_SITE_OTHER): Payer: Medicare Other | Admitting: Family Medicine

## 2012-08-07 VITALS — BP 130/84 | HR 95 | Temp 98.4°F | Resp 18 | Ht 67.5 in | Wt 223.0 lb

## 2012-08-07 DIAGNOSIS — K219 Gastro-esophageal reflux disease without esophagitis: Secondary | ICD-10-CM

## 2012-08-07 DIAGNOSIS — Z760 Encounter for issue of repeat prescription: Secondary | ICD-10-CM

## 2012-08-07 DIAGNOSIS — E7800 Pure hypercholesterolemia, unspecified: Secondary | ICD-10-CM

## 2012-08-07 DIAGNOSIS — I10 Essential (primary) hypertension: Secondary | ICD-10-CM

## 2012-08-07 DIAGNOSIS — E785 Hyperlipidemia, unspecified: Secondary | ICD-10-CM

## 2012-08-07 MED ORDER — PREGABALIN 75 MG PO CAPS: 75.0000 mg | ORAL_CAPSULE | Freq: Two times a day (BID) | ORAL | Status: DC

## 2012-08-07 MED ORDER — PANTOPRAZOLE SODIUM 40 MG PO TBEC: 40.0000 mg | DELAYED_RELEASE_TABLET | Freq: Every day | ORAL | Status: DC

## 2012-08-07 MED ORDER — DILTIAZEM HCL ER COATED BEADS 120 MG PO CP24: 120.0000 mg | ORAL_CAPSULE | Freq: Every day | ORAL | Status: DC

## 2012-08-07 MED ORDER — ATORVASTATIN CALCIUM 40 MG PO TABS: 40.0000 mg | ORAL_TABLET | Freq: Every day | ORAL | Status: DC

## 2012-08-07 NOTE — Patient Instructions (Signed)
Please ask your Cardiologist about whether you are a good candidate for changing from Coumadin (warfarin) to Xarelto or Pradaxa given your history of pulmonary embolism and DVT.  You will return to [facility] in 3 months for complete physical exam.

## 2012-08-07 NOTE — Progress Notes (Signed)
S:  This 67 y.o. AA female was last seen at [facility] by Dr. Nadyne Coombes in late 2012. Pt is here for immunization review; she reports Zostavax and Pneumonia vaccine at [facility] in 2012 (these have been entered on Immunization record). She was advised re: MCR guideline for Tdap.  Pt has well controlled HTN; she is compliant w/ medication and reports no adverse reaction. She denies fatigue, diaphoresis, CP or tightness, palpitations, edema, SOB or DOE, myalgias, HA, dizziness, numbness or syncope.    Pt is on chronic anti-coagulation for PE and DVT; this is monitored at [facility]. Pt inquiring about changing to one of the newer medications (Xarelto or Pradaxa) that does not require frequent blood testing. I have advised that she pose this question to her specialists at [facility].  Pt requests refills on all other chronic medications. GERD is controlled on current dose of PPI (pantoprazole). She does not report symptoms of increasing reflux, such as difficulty swallowing, hoarseness or increasing nocturnal symptoms.  Patient Active Problem List   Diagnosis Date Noted  . History of DVT (deep vein thrombosis) 03/01/2011  . Hx pulmonary embolism 03/01/2011  . HTN (hypertension) 03/01/2011  . Hypercholesterolemia 03/01/2011  . Reflux 03/01/2011    PMHx, Soc Hx and Fam Hx reviewed.  ROS: As per HPI.  O:  Filed Vitals:   08/07/12 0959  BP: 130/84  Pulse: 95  Temp: 98.4 F (36.9 C)  Resp: 18   GEN: In NAD; WN,WD. HENT: [place]/AT. EOMI w/ clear conj/sclerae. EACs/nose/oropn unremarkable. COR: RRR. No edema. LUNGS: Normal resp rate and effort. SKIN: W&D; no erythema or bruising. NEURO: A&O x 3; CNs intact. Ambulates w/ cane. Otherwise nonfocal  A/P: HTN (hypertension) -Stable and well controlled; no med change.    Plan: diltiazem (CARTIA XT) 120 MG 24 hr capsule, Basic metabolic panel, T4, Free, TSH  Dyslipidemia - Plan: atorvastatin (LIPITOR) 40 MG tablet, Lipid panel, ALT,  T4, Free, TSH  GERD (gastroesophageal reflux disease) - Plan: pantoprazole (PROTONIX) 40 MG tablet  Issue of repeat prescriptions

## 2012-08-08 LAB — LIPID PANEL
Cholesterol: 167 mg/dL (ref 0–200)
Total CHOL/HDL Ratio: 2.4 Ratio
VLDL: 22 mg/dL (ref 0–40)

## 2012-08-08 LAB — BASIC METABOLIC PANEL
BUN: 15 mg/dL (ref 6–23)
Potassium: 4 mEq/L (ref 3.5–5.3)

## 2012-08-08 LAB — T4, FREE: Free T4: 0.8 ng/dL (ref 0.80–1.80)

## 2012-08-08 LAB — ALT: ALT: <8 U/L (ref 0–35)

## 2012-08-10 ENCOUNTER — Encounter: Payer: Self-pay | Admitting: Family Medicine

## 2012-09-03 ENCOUNTER — Other Ambulatory Visit: Payer: Self-pay

## 2012-12-04 ENCOUNTER — Other Ambulatory Visit: Payer: Self-pay

## 2013-03-22 ENCOUNTER — Other Ambulatory Visit: Payer: Self-pay | Admitting: Family Medicine

## 2013-04-15 ENCOUNTER — Encounter: Payer: Self-pay | Admitting: Emergency Medicine

## 2013-04-26 ENCOUNTER — Other Ambulatory Visit: Payer: Self-pay | Admitting: Physician Assistant

## 2013-04-26 ENCOUNTER — Other Ambulatory Visit: Payer: Self-pay | Admitting: Family Medicine

## 2013-05-03 ENCOUNTER — Ambulatory Visit (INDEPENDENT_AMBULATORY_CARE_PROVIDER_SITE_OTHER): Payer: Medicare Other | Admitting: Internal Medicine

## 2013-05-03 VITALS — BP 148/90 | HR 108 | Temp 98.0°F | Resp 16 | Wt 226.0 lb

## 2013-05-03 DIAGNOSIS — G609 Hereditary and idiopathic neuropathy, unspecified: Secondary | ICD-10-CM

## 2013-05-03 DIAGNOSIS — H409 Unspecified glaucoma: Secondary | ICD-10-CM

## 2013-05-03 DIAGNOSIS — I10 Essential (primary) hypertension: Secondary | ICD-10-CM

## 2013-05-03 DIAGNOSIS — G629 Polyneuropathy, unspecified: Secondary | ICD-10-CM

## 2013-05-03 DIAGNOSIS — F17200 Nicotine dependence, unspecified, uncomplicated: Secondary | ICD-10-CM

## 2013-05-03 DIAGNOSIS — E785 Hyperlipidemia, unspecified: Secondary | ICD-10-CM

## 2013-05-03 DIAGNOSIS — K219 Gastro-esophageal reflux disease without esophagitis: Secondary | ICD-10-CM

## 2013-05-03 LAB — CBC WITH DIFFERENTIAL/PLATELET
Basophils Absolute: 0 10*3/uL (ref 0.0–0.1)
Basophils Relative: 0 % (ref 0–1)
EOS ABS: 0.1 10*3/uL (ref 0.0–0.7)
Eosinophils Relative: 3 % (ref 0–5)
HCT: 41.1 % (ref 36.0–46.0)
HEMOGLOBIN: 14.3 g/dL (ref 12.0–15.0)
LYMPHS ABS: 1.4 10*3/uL (ref 0.7–4.0)
LYMPHS PCT: 31 % (ref 12–46)
MCH: 30.4 pg (ref 26.0–34.0)
MCHC: 34.8 g/dL (ref 30.0–36.0)
MCV: 87.4 fL (ref 78.0–100.0)
MONOS PCT: 7 % (ref 3–12)
Monocytes Absolute: 0.3 10*3/uL (ref 0.1–1.0)
NEUTROS ABS: 2.7 10*3/uL (ref 1.7–7.7)
NEUTROS PCT: 59 % (ref 43–77)
PLATELETS: 215 10*3/uL (ref 150–400)
RBC: 4.7 MIL/uL (ref 3.87–5.11)
RDW: 13.5 % (ref 11.5–15.5)
WBC: 4.6 10*3/uL (ref 4.0–10.5)

## 2013-05-03 LAB — COMPREHENSIVE METABOLIC PANEL
ALK PHOS: 109 U/L (ref 39–117)
ALT: 8 U/L (ref 0–35)
AST: 26 U/L (ref 0–37)
Albumin: 3.8 g/dL (ref 3.5–5.2)
BILIRUBIN TOTAL: 0.6 mg/dL (ref 0.2–1.2)
BUN: 10 mg/dL (ref 6–23)
CO2: 26 mEq/L (ref 19–32)
Calcium: 8.7 mg/dL (ref 8.4–10.5)
Chloride: 104 mEq/L (ref 96–112)
Creat: 0.66 mg/dL (ref 0.50–1.10)
Glucose, Bld: 92 mg/dL (ref 70–99)
Potassium: 3.7 mEq/L (ref 3.5–5.3)
SODIUM: 142 meq/L (ref 135–145)
TOTAL PROTEIN: 7.2 g/dL (ref 6.0–8.3)

## 2013-05-03 LAB — LIPID PANEL
Cholesterol: 167 mg/dL (ref 0–200)
HDL: 68 mg/dL (ref >39)
LDL CALC: 79 mg/dL (ref 0–99)
Total CHOL/HDL Ratio: 2.5 Ratio
Triglycerides: 98 mg/dL (ref <150)
VLDL: 20 mg/dL (ref 0–40)

## 2013-05-03 LAB — HEMOGLOBIN A1C
HEMOGLOBIN A1C: 5.3 % (ref <5.7)
MEAN PLASMA GLUCOSE: 105 mg/dL (ref <117)

## 2013-05-03 MED ORDER — ATORVASTATIN CALCIUM 40 MG PO TABS: 40.0000 mg | ORAL_TABLET | Freq: Every day | ORAL | Status: DC

## 2013-05-03 MED ORDER — DILTIAZEM HCL ER COATED BEADS 120 MG PO CP24: 120.0000 mg | ORAL_CAPSULE | Freq: Every day | ORAL | Status: DC

## 2013-05-03 MED ORDER — PANTOPRAZOLE SODIUM 40 MG PO TBEC: 40.0000 mg | DELAYED_RELEASE_TABLET | Freq: Every day | ORAL | Status: DC

## 2013-05-03 MED ORDER — PREGABALIN 75 MG PO CAPS: 75.0000 mg | ORAL_CAPSULE | Freq: Two times a day (BID) | ORAL | Status: DC

## 2013-05-03 NOTE — Progress Notes (Signed)
Subjective:    Patient ID: [name], female    DOB: [birth year], 68 y.o.   MRN: [id number]  HPI Chief Complaint  Patient presents with  . Medication Refill   This chart was scribed for Tami Lin, MD by Thea Alken, ED Scribe. This patient was seen in room 14 and the patient's care was started at 12:35 PM.  HPI Comments: [name] is a 68 y.o. female who [facility] a medication refill regarding protonix, cartia XT, and  lipitor. Pt reports that she is out of her BP medication and that her BP has been unsteady. She reports that her last dose was 1 day ago. Pt denies seeing cardiologist. She reports that she is no longer taking coumadin and that she has not taken coumadin in 3-4 months. Pt report DVT in legs and pulmonary embolism. pt denies taking aspirin. Pt reports that she has been feeling well without coumadin. Pt reports that she had TDAP, flu, and pneumonia vaccines this year. Pt reports that she is a smoker. Pt reports that she has tried quitting but reports that her daughter who lives with her smokes as well.  She reports that she smokes about 4-5 cigarettes a day.   Pt report neuropathy in bilateral feet. Pt reports that she is unable to exercise. Pt reports h/o falls due to neuropathy. Pt reports that she uses a cane and walker for assistance. She reports that she is unaware where neuropathy came about. Pt denies h/o of DM. Pt reports back surgery in 2011-2012 due to bulging disc and stenosis. Pt reports that her neuropathy has not worsened. She reports neuropathy started before back surgery. Pt reports tingling her toes but reports that she has feeling in all toes accept in 5 digit bilaterally. Pt denies edema. She reports coldness in feet during the night. Pt is requesting lyrica.  Pt report that she has h/o of glaucoma worse in left than right.   Past Medical History  Diagnosis Date  . Arthritis   . Glaucoma   . Clotting  disorder    No Known Allergies Prior to Admission medications   Medication Sig Start Date End Date Taking? Authorizing Provider  atorvastatin (LIPITOR) 40 MG tablet Take 1 tablet (40 mg total) by mouth daily. 08/07/12  Yes Barton Fanny, MD  diltiazem (CARTIA XT) 120 MG 24 hr capsule Take 1 capsule (120 mg total) by mouth daily. PATIENT NEEDS OFFICE VISIT FOR ADDITIONAL REFILLS   Yes Heather M Marte, PA-C  pantoprazole (PROTONIX) 40 MG tablet Take 1 tablet (40 mg total) by mouth daily. PATIENT NEEDS OFFICE VISIT FOR ADDITIONAL REFILLS   Yes Heather M Marte, PA-C  pregabalin (LYRICA) 75 MG capsule Take 1 capsule (75 mg total) by mouth 2 (two) times daily. 08/07/12  Yes Barton Fanny, MD  HYDROcodone-acetaminophen (NORCO) 5-325 MG per tablet Take 1 tablet by mouth every 6 (six) hours as needed.    Historical Provider, MD  warfarin (COUMADIN) 10 MG tablet Take 10 mg by mouth daily.    Historical Provider, MD   Review of Systems  Constitutional: Negative for activity change, appetite change, fatigue and unexpected weight change.  HENT: Negative for hearing loss.   Respiratory: Negative for choking, shortness of breath and wheezing.   Cardiovascular: Negative for chest pain, palpitations and leg swelling.  Gastrointestinal: Negative for abdominal pain.  Genitourinary: Negative for frequency and difficulty urinating.  Musculoskeletal: Positive for gait problem. Negative  for joint swelling and neck pain.  Skin: Negative for rash.  Neurological: Negative for headaches.  Psychiatric/Behavioral: Negative for dysphoric mood.      Objective:   Physical Exam  Nursing note and vitals reviewed. Constitutional: She is oriented to person, place, and time. She appears well-developed and well-nourished. No distress.  HENT:  Head: Normocephalic and atraumatic.  Eyes: Conjunctivae and EOM are normal. Pupils are equal, round, and reactive to light.  Neck: Neck supple. No thyromegaly present.    Cardiovascular: Normal rate, regular rhythm, normal heart sounds and intact distal pulses.   No murmur heard. Pulmonary/Chest: Effort normal and breath sounds normal. No respiratory distress.  Musculoskeletal: Normal range of motion. She exhibits no edema.  Lymphadenopathy:    She has no cervical adenopathy.  Neurological: She is alert and oriented to person, place, and time. She has normal reflexes. No cranial nerve deficit.  sens decr over lat toes bilat  Skin: Skin is warm and dry.  Psychiatric: She has a normal mood and affect. Her behavior is normal.      Assessment & Plan:  I have completed the patient encounter in its entirety as documented by the scribe, with editing by me where necessary. Robert P. Laney Pastor, M.D.  Unspecified essential hypertension - Plan: Comprehensive metabolic panel, CBC with Differential  Other and unspecified hyperlipidemia - Plan: Lipid panel, CBC with Differential, Hemoglobin A1c  Peripheral neuropathy w/ freq falls -this needs further investigation--not like diab neurop/? Rel to her hx spinal stenosis w/ surgery---? Would it possibly improve w/ PT Ambulatory referral to Neurology  Nicotine dependence -disc grad cess for she and daughter  GERD (gastroesophageal reflux disease) - Plan: pantoprazole (PROTONIX) 40 MG tablet  Glaucoma--f/u opthal  bmi elevated      Meds ordered this encounter  Medications  . pregabalin (LYRICA) 75 MG capsule    Sig: Take 1 capsule (75 mg total) by mouth 2 (two) times daily.    Dispense:  180 capsule    Refill:  1  . pantoprazole (PROTONIX) 40 MG tablet    Sig: Take 1 tablet (40 mg total) by mouth daily.    Dispense:  90 tablet    Refill:  1  . diltiazem (CARTIA XT) 120 MG 24 hr capsule    Sig: Take 1 capsule (120 mg total) by mouth daily.    Dispense:  90 capsule    Refill:  1  . atorvastatin (LIPITOR) 40 MG tablet    Sig: Take 1 tablet (40 mg total) by mouth daily.    Dispense:  90 tablet    Refill:   1

## 2013-05-04 ENCOUNTER — Encounter: Payer: Self-pay | Admitting: Internal Medicine

## 2013-05-05 ENCOUNTER — Encounter: Payer: Self-pay | Admitting: Internal Medicine

## 2013-05-12 ENCOUNTER — Ambulatory Visit (INDEPENDENT_AMBULATORY_CARE_PROVIDER_SITE_OTHER): Payer: Medicare Other | Admitting: Neurology

## 2013-05-12 ENCOUNTER — Encounter: Payer: Self-pay | Admitting: Neurology

## 2013-05-12 VITALS — BP 135/86 | HR 104 | Ht 69.5 in | Wt 218.0 lb

## 2013-05-12 DIAGNOSIS — G609 Hereditary and idiopathic neuropathy, unspecified: Secondary | ICD-10-CM

## 2013-05-12 DIAGNOSIS — R259 Unspecified abnormal involuntary movements: Secondary | ICD-10-CM

## 2013-05-12 DIAGNOSIS — R269 Unspecified abnormalities of gait and mobility: Secondary | ICD-10-CM

## 2013-05-12 DIAGNOSIS — R251 Tremor, unspecified: Secondary | ICD-10-CM

## 2013-05-12 DIAGNOSIS — R2681 Unsteadiness on feet: Secondary | ICD-10-CM

## 2013-05-12 MED ORDER — PROPRANOLOL HCL ER 60 MG PO CP24: 60.0000 mg | ORAL_CAPSULE | Freq: Every day | ORAL | Status: DC
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
## 2013-05-12 NOTE — Patient Instructions (Signed)
Overall you are doing fairly well but I do want to suggest a few things today:   Remember to drink plenty of fluid, eat healthy meals and do not skip any meals. Try to eat protein with a every meal and eat a healthy snack such as fruit or nuts in between meals. Try to keep a regular sleep-wake schedule and try to exercise daily, particularly in the form of walking, 20-30 minutes a day, if you can.   As far as your medications are concerned, I would like to suggest the following: 1)Please start taking Propranolol LA 60mg  once daily. Please let us know if you have any light headed, dizziness  Please have some blood work checked today. Please schedule an EMG/nerve conduction study  We will follow up after the blood work is completed. Please call us with any interim questions, concerns, problems, updates or refill requests.   My clinical assistant and will answer any of your questions and relay your messages to me and also relay most of my messages to you.   Our phone number is [phone]. We also have an after hours call service for urgent matters and there is a physician on-call for urgent questions. For any emergencies you know to call 911 or go to the nearest emergency room

## 2013-05-14 LAB — METHYLMALONIC ACID, SERUM: METHYLMALONIC ACID: 147 nmol/L (ref 0–378)

## 2013-05-14 LAB — PROTEIN ELECTROPHORESIS
A/G RATIO SPE: 1.1 (ref 0.7–2.0)
Albumin ELP: 3.6 g/dL (ref 3.2–5.6)
Alpha 1: 0.2 g/dL (ref 0.1–0.4)
Alpha 2: 0.8 g/dL (ref 0.4–1.2)
Beta: 0.9 g/dL (ref 0.6–1.3)
GLOBULIN, TOTAL: 3.3 g/dL (ref 2.0–4.5)
Gamma Globulin: 1.4 g/dL (ref 0.5–1.6)
Total Protein: 6.9 g/dL (ref 6.0–8.5)

## 2013-05-14 LAB — VITAMIN B12: Vitamin B-12: 475 pg/mL (ref 211–946)

## 2013-05-20 ENCOUNTER — Telehealth: Payer: Self-pay | Admitting: Neurology

## 2013-05-20 NOTE — Telephone Encounter (Signed)
Pt's daughter Avarey has called concerning pt's feet and legs. She states they are swelled and wants to know if she is ok to still come for her test tomorrow. Please call pt's daughter Mylinda concerning this matter. I wasn't for sure if this needed to go to Triage or Laretta Alstrom. Thanks

## 2013-05-20 NOTE — Telephone Encounter (Signed)
Called pt and spoke with pt's daughter Talajah to inform her per Lorriane, EMG Tech, to inform pt to keep legs and feet elevated as mush as possible, before coming to the EMG appt. I advised the pt's daughter that if the pt has any other problems, questions or concerns to call the office. Pt's daughter verbalized understanding.

## 2013-05-21 ENCOUNTER — Encounter (INDEPENDENT_AMBULATORY_CARE_PROVIDER_SITE_OTHER): Payer: Self-pay

## 2013-05-21 ENCOUNTER — Ambulatory Visit (INDEPENDENT_AMBULATORY_CARE_PROVIDER_SITE_OTHER): Payer: Medicare Other | Admitting: Neurology

## 2013-05-21 DIAGNOSIS — G609 Hereditary and idiopathic neuropathy, unspecified: Secondary | ICD-10-CM

## 2013-05-21 DIAGNOSIS — G63 Polyneuropathy in diseases classified elsewhere: Secondary | ICD-10-CM

## 2013-05-21 DIAGNOSIS — Z0289 Encounter for other administrative examinations: Secondary | ICD-10-CM

## 2013-05-21 NOTE — Procedures (Signed)
     HISTORY:  [name] is a 68 year old patient with a history of lumbosacral spine surgery and a history of numbness in the feet and legs. The patient has some issues with balance, and she walks with a cane. The patient is being evaluated for the leg numbness.  NERVE CONDUCTION STUDIES:  Nerve conduction studies were performed on the right upper extremity. The distal motor latencies and motor amplitudes for the median and ulnar nerves were within normal limits. The F wave latencies and nerve conduction velocities for these nerves were also normal. The sensory latencies for the median and ulnar nerves were normal.  Nerve conduction studies were performed on both lower extremities. The distal motor latencies for the peroneal nerves were normal bilaterally, with a low motor amplitude on the left, borderline normal on the right. The distal motor latencies for the posterior tibial nerves were borderline normal on the left, normal on the right, with low motor amplitudes seen for these nerves bilaterally. The nerve conduction velocities for the peroneal and posterior tibial nerves are normal bilaterally. The sensory latencies for the peroneal nerves are absent bilaterally. The H reflex latencies are absent bilaterally.  EMG STUDIES:  EMG study was performed on the right lower extremity:  The tibialis anterior muscle reveals 2 to 4K motor units with full recruitment. No fibrillations or positive waves were seen. The peroneus tertius muscle reveals 2 to 4K motor units with full recruitment. No fibrillations or positive waves were seen. The medial gastrocnemius muscle reveals 1 to 3K motor units with full recruitment. No fibrillations or positive waves were seen. The vastus lateralis muscle reveals 2 to 4K motor units with full recruitment. No fibrillations or positive waves were seen. The iliopsoas muscle reveals 2 to 4K motor units with full recruitment. No fibrillations or positive waves were  seen. The biceps femoris muscle (long head) reveals 2 to 4K motor units with full recruitment. No fibrillations or positive waves were seen. The lumbosacral paraspinal muscles were tested at 3 levels, and revealed no abnormalities of insertional activity at all 3 levels tested. There was good relaxation.   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 EMG evaluation however, of the right lower extremity was essentially normal, suggesting that the underlying peripheral neuropathy is not as severe as the nerve conduction study suggests. The patient does have some lower extremity edema that may alter nerve conduction study findings. There is no evidence of an overlying lumbosacral radiculopathy on the right.  Jill Alexanders MD 05/21/2013 4:24 PM  [facility] [address] [place], [place] [address]  Phone [phone] Fax [phone]

## 2013-11-13 ENCOUNTER — Other Ambulatory Visit: Payer: Self-pay | Admitting: Internal Medicine

## 2013-11-14 NOTE — Telephone Encounter (Signed)
Patient wants a call back regarding her meds and status.   [phone]

## 2013-11-15 ENCOUNTER — Telehealth: Payer: Self-pay

## 2013-11-15 DIAGNOSIS — I10 Essential (primary) hypertension: Secondary | ICD-10-CM

## 2013-11-15 DIAGNOSIS — K2100 Gastro-esophageal reflux disease with esophagitis, without bleeding: Secondary | ICD-10-CM

## 2013-11-15 NOTE — Telephone Encounter (Signed)
Pt is checking on status of her refill request and she is completely out of medication   Best number [phone]

## 2013-11-15 NOTE — Telephone Encounter (Signed)
What medication does she need? I do not see a refill request.

## 2013-11-16 MED ORDER — PANTOPRAZOLE SODIUM 40 MG PO TBEC: 40.0000 mg | DELAYED_RELEASE_TABLET | Freq: Every day | ORAL | Status: DC

## 2013-11-16 MED ORDER — DILTIAZEM HCL ER COATED BEADS 120 MG PO CP24: 120.0000 mg | ORAL_CAPSULE | Freq: Every day | ORAL | Status: DC

## 2013-11-16 NOTE — Telephone Encounter (Signed)
Pt needed refill on her Cartia and Protonix. Sent in #30 day supply- spoke to pt advised her it was necessary to follow up and she states she will be in this weekend to see Dr. Laney Pastor.

## 2013-12-11 ENCOUNTER — Ambulatory Visit (INDEPENDENT_AMBULATORY_CARE_PROVIDER_SITE_OTHER): Payer: Medicare Other | Admitting: Internal Medicine

## 2013-12-11 VITALS — BP 118/76 | HR 114 | Temp 98.2°F | Resp 16 | Ht 69.0 in | Wt 211.4 lb

## 2013-12-11 DIAGNOSIS — I10 Essential (primary) hypertension: Secondary | ICD-10-CM

## 2013-12-11 DIAGNOSIS — G252 Other specified forms of tremor: Secondary | ICD-10-CM

## 2013-12-11 DIAGNOSIS — Z23 Encounter for immunization: Secondary | ICD-10-CM

## 2013-12-11 DIAGNOSIS — K2100 Gastro-esophageal reflux disease with esophagitis, without bleeding: Secondary | ICD-10-CM

## 2013-12-11 DIAGNOSIS — E785 Hyperlipidemia, unspecified: Secondary | ICD-10-CM

## 2013-12-11 DIAGNOSIS — G5792 Unspecified mononeuropathy of left lower limb: Secondary | ICD-10-CM

## 2013-12-11 MED ORDER — PROPRANOLOL HCL ER 120 MG PO CP24: 120.0000 mg | ORAL_CAPSULE | Freq: Every day | ORAL | Status: DC

## 2013-12-11 MED ORDER — PANTOPRAZOLE SODIUM 40 MG PO TBEC: 40.0000 mg | DELAYED_RELEASE_TABLET | Freq: Every day | ORAL | Status: DC

## 2013-12-11 MED ORDER — GABAPENTIN 300 MG PO CAPS: 300.0000 mg | ORAL_CAPSULE | Freq: Every day | ORAL | Status: DC

## 2013-12-11 MED ORDER — DILTIAZEM HCL ER COATED BEADS 120 MG PO CP24: 120.0000 mg | ORAL_CAPSULE | Freq: Every day | ORAL | Status: DC

## 2013-12-11 MED ORDER — ATORVASTATIN CALCIUM 40 MG PO TABS: 40.0000 mg | ORAL_TABLET | Freq: Every day | ORAL | Status: DC

## 2013-12-11 MED ORDER — AMOXICILLIN 875 MG PO TABS: 875.0000 mg | ORAL_TABLET | Freq: Two times a day (BID) | ORAL | Status: DC

## 2013-12-11 MED ORDER — HYDROCODONE-HOMATROPINE 5-1.5 MG/5ML PO SYRP: 5.0000 mL | ORAL_SOLUTION | Freq: Four times a day (QID) | ORAL | Status: DC | PRN

## 2013-12-11 NOTE — Progress Notes (Signed)
Subjective:   This chart was scribed for Tami Lin, MD by Stephania Fragmin, ED Scribe. This patient was seen in room 13 and the patient's care was started at 2:15 PM.    Patient ID: [name], female    DOB: 02-14-1945, 68 y.o.   MRN: [id number]  Chief Complaint  Patient presents with  . Follow-up  . Medication Refill  . Generalized Body Aches    x 2 days  . Shortness of Breath  . Nasal Congestion  . Cough    productive cough with yellowish phelgm in the ,orning and later in the day it is clear   HPI  HPI Comments: [name] is a 68 y.o. female who presents to the [facility] complaining of body aches located around her chest and around both BUE and back that began 3-4 days ago. Lying down exacerbates her pain. She complains of associated non-productive, tight coughing that mostly occurs when she lies down at night, as well as sneezing that seems to be due to a cold that occurs during the daytime. She produces yellow-green sputum production in the morning, although the sputum turns clear shortly after. Laughing has made her cough worse once or twice for just a short duration. She confirms diaphoresis in the evening, but has had it for some time and thought it might be due to old age. She notes that she breathes through her mouth at night. Patient denies chills, ear popping, sinus pressure or drainage, and sore throat. She sleeps a lot during the day because she loses sleep at night from aches and tossing and turning.  Patient also complains of tingling in the bottom of her feet, as well as associated loss of balance and difficulty walking. She uses a cane and a walker. At night, she also notes that she has to sleep with her feet hanging off the bed or else it feels like "something heavy is on it." A doctor in [place] told her that she had nerve damage in both feet with unknown causes. Patient has had tremors in both hands that have been present since childhood but have worsened over  the past few months. She is currently dropping her food when eating and is unable to sign her name.  Patient confirms that she is satisfied with her current medication for hypertension and hyperlipidemia. She has not been getting refills recently on her Lyrica prescription due to a high co-pay. She has had a flu and pneumonia shot last November.    Prior to Admission medications   Medication Sig Start Date End Date Taking? Authorizing Provider  atorvastatin (LIPITOR) 40 MG tablet Take 1 tablet (40 mg total) by mouth daily. 05/03/13  Yes Leandrew Koyanagi, MD  diltiazem (CARTIA XT) 120 MG 24 hr capsule Take 1 capsule (120 mg total) by mouth daily. 11/16/13  Yes Chelle S Jeffery, PA-C  pantoprazole (PROTONIX) 40 MG tablet Take 1 tablet (40 mg total) by mouth daily. 11/16/13  Yes Chelle S Jeffery, PA-C  pregabalin (LYRICA) 75 MG capsule Take 1 capsule (75 mg total) by mouth 2 (two) times daily. 05/03/13  Yes Leandrew Koyanagi, MD  propranolol ER (INDERAL LA) 60 MG 24 hr capsule Take 1 capsule (60 mg total) by mouth daily. 05/12/13  Yes Hulen Luster, DO     Review of Systems See pertinent things in the present illness, otherwise, Non-contributory.    Objective:   Physical Exam BP 118/76 mmHg  Pulse 114  Temp(Src) 98.2  F (36.8 C) (Oral)  Resp 16  Ht 5\' 9"  (1.753 m)  Wt 211 lb 6.4 oz (95.89 kg)  BMI 31.20 kg/m2  SpO2 95% HEENT clear No thyromegaly/neck full range of motion Heart regular without murmur Lungs clear to auscultation Tender to palpation around the lateral rib cage to the posterior aspect of the lower ribs/pain with range of motion No defect or swelling Abdomen benign Extremities reveal decreased sensation on the leftlower extremity Neurological reveals an intention tremor/no tremor at rest/the level of the tremor is significant There is no nodding Deep tendon reflexes are symmetrical No sensory losses in the upper extremities Gait is slightly unsteady but not  festinating     Assessment & Plan:   Given prescription of Amoxicillin and Hycodan.  Prescribed Gabapentin, and referred to a neurologist.  Continue prescriptions for hypertension and hyperlipidemia.   I have completed the patient encounter in its entirety as documented by the scribe, with editing by me where necessary. Garnell Begeman P. Laney Pastor, M.D. 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 propranolol ER (INDERAL LA) 120 MG 24 hr capsule    Sig: Take 1 capsule (120 mg total) by mouth daily.    Dispense:  30 capsule    Refill:  2  . pantoprazole (PROTONIX) 40 MG tablet    Sig: Take 1 tablet (40 mg total) by mouth daily.    Dispense:  90 tablet    Refill:  1  . atorvastatin (LIPITOR) 40 MG tablet    Sig: Take 1 tablet (40 mg total) by mouth daily.    Dispense:  90 tablet    Refill:  1  . diltiazem (CARTIA XT) 120 MG 24 hr capsule    Sig: Take 1 capsule (120 mg total) by mouth daily.    Dispense:  90 capsule    Refill:  1  . amoxicillin (AMOXIL) 875 MG tablet    Sig: Take 1 tablet (875 mg total) by mouth 2 (two) times daily.    Dispense:  20 tablet    Refill:  0  . HYDROcodone-homatropine (HYCODAN) 5-1.5 MG/5ML syrup    Sig: Take 5  mLs by mouth every 6 (six) hours as needed.    Dispense:  120 mL    Refill:  0  . gabapentin (NEURONTIN) 300 MG capsule    Sig: Take 1 capsule (300 mg total) by mouth at bedtime. After 7 days may increase to 2 at bedtime    Dispense:  60 capsule    Refill:  2

## 2013-12-17 ENCOUNTER — Telehealth: Payer: Self-pay | Admitting: Physician Assistant

## 2013-12-17 NOTE — Telephone Encounter (Signed)
Pt of Dr. Laney Pastor needs this script. States he was going to send in all her scripts on her last visit. Pt is still waiting on this. Please advise

## 2013-12-17 NOTE — Telephone Encounter (Signed)
rx sent to the pharmacy. Pt advised.

## 2013-12-18 ENCOUNTER — Encounter (HOSPITAL_COMMUNITY): Payer: Self-pay | Admitting: *Deleted

## 2013-12-18 ENCOUNTER — Emergency Department (HOSPITAL_COMMUNITY)
Admission: EM | Admit: 2013-12-18 | Discharge: 2013-12-18 | Disposition: A | Discharge status: Home / Self Care | Payer: Medicare Other | Attending: Emergency Medicine | Admitting: Emergency Medicine

## 2013-12-18 DIAGNOSIS — M79609 Pain in unspecified limb: Secondary | ICD-10-CM

## 2013-12-18 DIAGNOSIS — M79605 Pain in left leg: Secondary | ICD-10-CM | POA: Diagnosis present

## 2013-12-18 DIAGNOSIS — I82402 Acute embolism and thrombosis of unspecified deep veins of left lower extremity: Secondary | ICD-10-CM | POA: Diagnosis not present

## 2013-12-18 DIAGNOSIS — R21 Rash and other nonspecific skin eruption: Secondary | ICD-10-CM | POA: Insufficient documentation

## 2013-12-18 DIAGNOSIS — Z7982 Long term (current) use of aspirin: Secondary | ICD-10-CM | POA: Insufficient documentation

## 2013-12-18 DIAGNOSIS — Z79899 Other long term (current) drug therapy: Secondary | ICD-10-CM | POA: Diagnosis not present

## 2013-12-18 DIAGNOSIS — M1990 Unspecified osteoarthritis, unspecified site: Secondary | ICD-10-CM | POA: Insufficient documentation

## 2013-12-18 DIAGNOSIS — Z86718 Personal history of other venous thrombosis and embolism: Secondary | ICD-10-CM

## 2013-12-18 DIAGNOSIS — Z8669 Personal history of other diseases of the nervous system and sense organs: Secondary | ICD-10-CM | POA: Diagnosis not present

## 2013-12-18 DIAGNOSIS — Z86711 Personal history of pulmonary embolism: Secondary | ICD-10-CM

## 2013-12-18 DIAGNOSIS — R609 Edema, unspecified: Secondary | ICD-10-CM

## 2013-12-18 DIAGNOSIS — Z720 Tobacco use: Secondary | ICD-10-CM | POA: Insufficient documentation

## 2013-12-18 DIAGNOSIS — Z792 Long term (current) use of antibiotics: Secondary | ICD-10-CM | POA: Diagnosis not present

## 2013-12-18 LAB — BASIC METABOLIC PANEL
ANION GAP: 15 (ref 5–15)
BUN: 8 mg/dL (ref 6–23)
CHLORIDE: 98 meq/L (ref 96–112)
CO2: 22 meq/L (ref 19–32)
Calcium: 9.6 mg/dL (ref 8.4–10.5)
Creatinine, Ser: 0.89 mg/dL (ref 0.50–1.10)
GFR calc Af Amer: 77 mL/min — ABNORMAL LOW (ref >90)
GFR calc non Af Amer: 66 mL/min — ABNORMAL LOW (ref >90)
GLUCOSE: 143 mg/dL — AB (ref 70–99)
Potassium: 4.2 mEq/L (ref 3.7–5.3)
Sodium: 135 mEq/L — ABNORMAL LOW (ref 137–147)

## 2013-12-18 LAB — CBC WITH DIFFERENTIAL/PLATELET
Basophils Absolute: 0 10*3/uL (ref 0.0–0.1)
Basophils Relative: 0 % (ref 0–1)
Eosinophils Absolute: 0.1 10*3/uL (ref 0.0–0.7)
Eosinophils Relative: 2 % (ref 0–5)
HEMATOCRIT: 43 % (ref 36.0–46.0)
HEMOGLOBIN: 14.3 g/dL (ref 12.0–15.0)
LYMPHS ABS: 1.2 10*3/uL (ref 0.7–4.0)
Lymphocytes Relative: 16 % (ref 12–46)
MCH: 31.9 pg (ref 26.0–34.0)
MCHC: 33.3 g/dL (ref 30.0–36.0)
MCV: 96 fL (ref 78.0–100.0)
MONOS PCT: 8 % (ref 3–12)
Monocytes Absolute: 0.6 10*3/uL (ref 0.1–1.0)
NEUTROS ABS: 5.7 10*3/uL (ref 1.7–7.7)
Neutrophils Relative %: 74 % (ref 43–77)
Platelets: 177 10*3/uL (ref 150–400)
RBC: 4.48 MIL/uL (ref 3.87–5.11)
RDW: 12.9 % (ref 11.5–15.5)
WBC: 7.6 10*3/uL (ref 4.0–10.5)

## 2013-12-18 MED ORDER — RIVAROXABAN 15 MG PO TABS: 15.0000 mg | ORAL_TABLET | Freq: Once | ORAL | Status: DC

## 2013-12-18 MED ORDER — ENOXAPARIN SODIUM 100 MG/ML ~~LOC~~ SOLN
100.0000 mg | Freq: Once | SUBCUTANEOUS | Status: DC
  Filled 2013-12-18: qty 1

## 2013-12-18 MED ORDER — ENOXAPARIN SODIUM 100 MG/ML ~~LOC~~ SOLN
100.0000 mg | Freq: Two times a day (BID) | SUBCUTANEOUS | Status: DC
  Filled 2013-12-18: qty 1

## 2013-12-18 MED ORDER — XARELTO VTE STARTER PACK 15 & 20 MG PO TBPK: 15.0000 mg | ORAL_TABLET | ORAL | Status: DC

## 2013-12-18 MED ORDER — HYDROCODONE-ACETAMINOPHEN 5-325 MG PO TABS
1.0000 | ORAL_TABLET | Freq: Once | ORAL | Status: AC
  Administered 2013-12-18: 1 via ORAL
  Filled 2013-12-18: qty 1

## 2013-12-18 MED ORDER — RIVAROXABAN 15 MG PO TABS
15.0000 mg | ORAL_TABLET | Freq: Once | ORAL | Status: AC
  Administered 2013-12-18: 15 mg via ORAL
  Filled 2013-12-18: qty 1

## 2013-12-18 NOTE — Progress Notes (Addendum)
ANTICOAGULATION CONSULT NOTE - Initial Consult  Pharmacy Consult for Lovenox Indication: LLE DVT  No Known Allergies  Patient Measurements:   Total body weight: 96kg  Vital Signs: Temp: 97.6 F (36.4 C) (11/20 1249) Temp Source: Oral (11/20 1249) BP: 107/61 mmHg (11/20 1243) Pulse Rate: 83 (11/20 1300)  Labs:  Recent Labs  12/18/13 1306  HGB 14.3  HCT 43.0  PLT 177  CREATININE 0.89   Estimated Creatinine Clearance: 76.7 mL/min (by C-G formula based on Cr of 0.89).  Medical History: Past Medical History  Diagnosis Date  . Arthritis   . Glaucoma   . Clotting disorder    Medications:  Scheduled:  . enoxaparin (LOVENOX) injection  100 mg Subcutaneous Once  . [START ON 12/19/2013] enoxaparin (LOVENOX) injection  100 mg Subcutaneous Q12H   Assessment: 20 yoF with LLE VTE by doppler. Hx of RLE VTE 2009, was on Warfarin.  Begin Lovenox per Pharmacy at 1mg /kg q12h, clearance wnl, patient obese  Goal of Therapy:  Anti-Xa level 0.6-1 units/ml 4hrs after LMWH dose given   Plan:   Lovenox 100mg  SQ bid  Consider AntiXa level with obesity  Can consider Lovenox 1.5mg /kg SQ q24 if will be used long-term  Anticipate change to po anti-coagulant, if Warfarin used would need minimum 5 day overlap with Lovenox  Minda Ditto PharmD Pager [phone] 12/18/2013, 3:12 PM   1600 Addendum  Lovenox d/c, begin Xarelto, gave starter pack coupon  Counseled patient and daughter present, answered questions  Recommendation to stop ASA, concern for GI bleed, avoid Ibuprofen while on Laurita Quint PharmD Pager [phone] 12/18/2013, 4:00 PM

## 2013-12-18 NOTE — Discharge Instructions (Signed)
You have blood clot in your Left leg. Please started taking Xarelto tomorrow. Read about xarelto below. See your primary care doctor as soon as possible. Return to the ER if there is any shortness of breath.   Deep Vein Thrombosis A deep vein thrombosis (DVT) is a blood clot that develops in the deep, larger veins of the leg, arm, or pelvis. These are more dangerous than clots that might form in veins near the surface of the body. A DVT can lead to serious and even life-threatening complications if the clot breaks off and travels in the bloodstream to the lungs.  A DVT can damage the valves in your leg veins so that instead of flowing upward, the blood pools in the lower leg. This is called post-thrombotic syndrome, and it can result in pain, swelling, discoloration, and sores on the leg. CAUSES Usually, several things contribute to the formation of blood clots. Contributing factors include:  The flow of blood slows down.  The inside of the vein is damaged in some way.  You have a condition that makes blood clot more easily. RISK FACTORS Some people are more likely than others to develop blood clots. Risk factors include:   Smoking.  Being overweight (obese).  Sitting or lying still for a long time. This includes long-distance travel, paralysis, or recovery from an illness or surgery. Other factors that increase risk are:   Older age, especially over 43 years of age.  Having a family history of blood clots or if you have already had a blot clot.  Having major or lengthy surgery. This is especially true for surgery on the hip, knee, or belly (abdomen). Hip surgery is particularly high risk.  Having a long, thin tube (catheter) placed inside a vein during a medical procedure.  Breaking a hip or leg.  Having cancer or cancer treatment.  Pregnancy and childbirth.  Hormone changes make the blood clot more easily during pregnancy.  The fetus puts pressure on the veins of the  pelvis.  There is a risk of injury to veins during delivery or a caesarean delivery. The risk is highest just after childbirth.  Medicines containing the female hormone estrogen. This includes birth control pills and hormone replacement therapy.  Other circulation or heart problems.  SIGNS AND SYMPTOMS When a clot forms, it can either partially or totally block the blood flow in that vein. Symptoms of a DVT can include:  Swelling of the leg or arm, especially if one side is much worse.  Warmth and redness of the leg or arm, especially if one side is much worse.  Pain in an arm or leg. If the clot is in the leg, symptoms may be more noticeable or worse when standing or walking. The symptoms of a DVT that has traveled to the lungs (pulmonary embolism, PE) usually start suddenly and include:  Shortness of breath.  Coughing.  Coughing up blood or blood-tinged mucus.  Chest pain. The chest pain is often worse with deep breaths.  Rapid heartbeat. Anyone with these symptoms should get emergency medical treatment right away. Do not wait to see if the symptoms will go away. Call your local emergency services (911 in the [place] if you have these symptoms. Do not drive yourself to the hospital. DIAGNOSIS If a DVT is suspected, your health care provider will take a full medical history and perform a physical exam. Tests that also may be required include:  Blood tests, including studies of the clotting properties of the  blood.  Ultrasound to see if you have clots in your legs or lungs.  X-rays to show the flow of blood when dye is injected into the veins (venogram).  Studies of your lungs if you have any chest symptoms. PREVENTION  Exercise the legs regularly. Take a brisk 30-minute walk every day.  Maintain a weight that is appropriate for your height.  Avoid sitting or lying in bed for long periods of time without moving your legs.  Women, particularly those over the age of 30  years, should consider the risks and benefits of taking estrogen medicines, including birth control pills.  Do not smoke, especially if you take estrogen medicines.  Long-distance travel can increase your risk of DVT. You should exercise your legs by walking or pumping the muscles every hour.  Many of the risk factors above relate to situations that exist with hospitalization, either for illness, injury, or elective surgery. Prevention may include medical and nonmedical measures.  Your health care provider will assess you for the need for venous thromboembolism prevention when you are admitted to the hospital. If you are having surgery, your surgeon will assess you the day of or day after surgery. TREATMENT Once identified, a DVT can be treated. It can also be prevented in some circumstances. Once you have had a DVT, you may be at increased risk for a DVT in the future. The most common treatment for DVT is blood-thinning (anticoagulant) medicine, which reduces the blood's tendency to clot. Anticoagulants can stop new blood clots from forming and stop old clots from growing. They cannot dissolve existing clots. Your body does this by itself over time. Anticoagulants can be given by mouth, through an IV tube, or by injection. Your health care provider will determine the best program for you. Other medicines or treatments that may be used are:  Heparin or related medicines (low molecular weight heparin) are often the first treatment for a blood clot. They act quickly. However, they cannot be taken orally and must be given either in shot form or by IV tube.  Heparin can cause a fall in a component of blood that stops bleeding and forms blood clots (platelets). You will be monitored with blood tests to be sure this does not occur.  Warfarin is an anticoagulant that can be swallowed. It takes a few days to start working, so usually heparin or related medicines are used in combination. Once warfarin is  working, heparin is usually stopped.  Factor Xa inhibitor medicines, such as rivaroxaban and apixaban, also reduce blood clotting. These medicines are taken orally and can often be used without heparin or related medicines.  Less commonly, clot dissolving drugs (thrombolytics) are used to dissolve a DVT. They carry a high risk of bleeding, so they are used mainly in severe cases where your life or a part of your body is threatened.  Very rarely, a blood clot in the leg needs to be removed surgically.  If you are unable to take anticoagulants, your health care provider may arrange for you to have a filter placed in a main vein in your abdomen. This filter prevents clots from traveling to your lungs. HOME CARE INSTRUCTIONS  Take all medicines as directed by your health care provider.  Learn as much as you can about DVT.  Wear a medical alert bracelet or carry a medical alert card.  Ask your health care provider how soon you can go back to normal activities. It is important to stay active to prevent  blood clots. If you are on anticoagulant medicine, avoid contact sports.  It is very important to exercise. This is especially important while traveling, sitting, or standing for long periods of time. Exercise your legs by walking or by tightening and relaxing your leg muscles regularly. Take frequent walks.  You may need to wear compression stockings. These are tight elastic stockings that apply pressure to the lower legs. This pressure can help keep the blood in the legs from clotting. Taking Warfarin Warfarin is a daily medicine that is taken by mouth. Your health care provider will advise you on the length of treatment (usually 3-6 months, sometimes lifelong). If you take warfarin:  Understand how to take warfarin and foods that can affect how warfarin works in Veterinary surgeon.  Too much and too little warfarin are both dangerous. Too much warfarin increases the risk of bleeding. Too little warfarin  continues to allow the risk for blood clots. Warfarin and Regular Blood Testing While taking warfarin, you will need to have regular blood tests to measure your blood clotting time. These blood tests usually include both the prothrombin time (PT) and international normalized ratio (INR) tests. The PT and INR results allow your health care provider to adjust your dose of warfarin. It is very important that you have your PT and INR tested as often as directed by your health care provider.  Warfarin and Your Diet Avoid major changes in your diet, or notify your health care provider before changing your diet. Arrange a visit with a registered dietitian to answer your questions. Many foods, especially foods high in vitamin K, can interfere with warfarin and affect the PT and INR results. You should eat a consistent amount of foods high in vitamin K. Foods high in vitamin K include:   Spinach, kale, broccoli, cabbage, collard and turnip greens, Brussels sprouts, peas, cauliflower, seaweed, and parsley.  Beef and pork liver.  Green tea.  Soybean oil. Warfarin with Other Medicines Many medicines can interfere with warfarin and affect the PT and INR results. You must:  Tell your health care provider about any and all medicines, vitamins, and supplements you take, including aspirin and other over-the-counter anti-inflammatory medicines. Be especially cautious with aspirin and anti-inflammatory medicines. Ask your health care provider before taking these.  Do not take or discontinue any prescribed or over-the-counter medicine except on the advice of your health care provider or pharmacist. Warfarin Side Effects Warfarin can have side effects, such as easy bruising and difficulty stopping bleeding. Ask your health care provider or pharmacist about other side effects of warfarin. You will need to:  Hold pressure over cuts for longer than usual.  Notify your dentist and other health care providers that  you are taking warfarin before you undergo any procedures where bleeding may occur. Warfarin with Alcohol and Tobacco   Drinking alcohol frequently can increase the effect of warfarin, leading to excess bleeding. It is best to avoid alcoholic drinks or to consume only very small amounts while taking warfarin. Notify your health care provider if you change your alcohol intake.   Do not use any tobacco products including cigarettes, chewing tobacco, or electronic cigarettes. If you smoke, quit. Ask your health care provider for help with quitting smoking. Alternative Medicines to Warfarin: Factor Xa Inhibitor Medicines  These blood-thinning medicines are taken by mouth, usually for several weeks or longer. It is important to take the medicine every single day at the same time each day.  There are no regular blood  tests required when using these medicines.  There are fewer food and drug interactions than with warfarin.  The side effects of this class of medicine are similar to those of warfarin, including excessive bruising or bleeding. Ask your health care provider or pharmacist about other potential side effects. SEEK MEDICAL CARE IF:  You notice a rapid heartbeat.  You feel weaker or more tired than usual.  You feel faint.  You notice increased bruising.  You feel your symptoms are not getting better in the time expected.  You believe you are having side effects of medicine. SEEK IMMEDIATE MEDICAL CARE IF:  You have chest pain.  You have trouble breathing.  You have new or increased swelling or pain in one leg.  You cough up blood.  You notice blood in vomit, in a bowel movement, or in urine. MAKE SURE YOU:  Understand these instructions.  Will watch your condition.  Will get help right away if you are not doing well or get worse. Document Released: 01/15/2005 Document Revised: 06/01/2013 Document Reviewed: 09/22/2012 [facility] Patient Information 2015 [place], [place].  This information is not intended to replace advice given to you by your health care provider. Make sure you discuss any questions you have with your health care provider. Rivaroxaban oral tablets What is this medicine? RIVAROXABAN (ri va ROX a ban) is an anticoagulant (blood thinner). It is used to treat blood clots in the lungs or in the veins. It is also used after knee or hip surgeries to prevent blood clots. It is also used to lower the chance of stroke in people with a medical condition called atrial fibrillation. This medicine may be used for other purposes; ask your health care provider or pharmacist if you have questions. COMMON BRAND NAME(S): Xarelto, Xarelto Starter Pack What should I tell my health care provider before I take this medicine? They need to know if you have any of these conditions: -bleeding disorders -bleeding in the brain -blood in your stools (black or tarry stools) or if you have blood in your vomit -history of stomach bleeding -kidney disease -liver disease -low blood counts, like low white cell, platelet, or red cell counts -recent or planned spinal or epidural procedure -take medicines that treat or prevent blood clots -an unusual or allergic reaction to rivaroxaban, other medicines, foods, dyes, or preservatives -pregnant or trying to get pregnant -breast-feeding How should I use this medicine? Take this medicine by mouth with a glass of water. Follow the directions on the prescription label. Take your medicine at regular intervals. Do not take it more often than directed. Do not stop taking except on your doctor's advice. Stopping this medicine may increase your risk of a blot clot. Be sure to refill your prescription before you run out of medicine. If you are taking this medicine after hip or knee replacement surgery, take it with or without food. If you are taking this medicine for atrial fibrillation, take it with your evening meal. If you are taking this  medicine to treat blood clots, take it with food at the same time each day. If you are unable to swallow your tablet, you may crush the tablet and mix it in applesauce. Then, immediately eat the applesauce. You should eat more food right after you eat the applesauce containing the crushed tablet. Talk to your pediatrician regarding the use of this medicine in children. Special care may be needed. Overdosage: If you think you have taken too much of this medicine contact a  poison control center or emergency room at once. NOTE: This medicine is only for you. Do not share this medicine with others. What if I miss a dose? If you take your medicine once a day and miss a dose, take the missed dose as soon as you remember. If you take your medicine twice a day and miss a dose, take the missed dose immediately. In this instance, 2 tablets may be taken at the same time. The next day you should take 1 tablet twice a day as directed. What may interact with this medicine? -aspirin and aspirin-like medicines -certain antibiotics like erythromycin, azithromycin, and clarithromycin -certain medicines for fungal infections like ketoconazole and itraconazole -certain medicines for irregular heart beat like amiodarone, quinidine, dronedarone -certain medicines for seizures like carbamazepine, phenytoin -certain medicines that treat or prevent blood clots like warfarin, enoxaparin, and dalteparin -conivaptan -diltiazem -felodipine -indinavir -lopinavir; ritonavir -NSAIDS, medicines for pain and inflammation, like ibuprofen or naproxen -ranolazine -rifampin -ritonavir -St. John's wort -verapamil This list may not describe all possible interactions. Give your health care provider a list of all the medicines, herbs, non-prescription drugs, or dietary supplements you use. Also tell them if you smoke, drink alcohol, or use illegal drugs. Some items may interact with your medicine. What should I watch for while using  this medicine? Visit your doctor or health care professional for regular checks on your progress. Your condition will be monitored carefully while you are receiving this medicine. Notify your doctor or health care professional and seek emergency treatment if you develop breathing problems; changes in vision; chest pain; severe, sudden headache; pain, swelling, warmth in the leg; trouble speaking; sudden numbness or weakness of the face, arm, or leg. These can be signs that your condition has gotten worse. If you are going to have surgery, tell your doctor or health care professional that you are taking this medicine. Tell your health care professional that you use this medicine before you have a spinal or epidural procedure. Sometimes people who take this medicine have bleeding problems around the spine when they have a spinal or epidural procedure. This bleeding is very rare. If you have a spinal or epidural procedure while on this medicine, call your health care professional immediately if you have back pain, numbness or tingling (especially in your legs and feet), muscle weakness, paralysis, or loss of bladder or bowel control. Avoid sports and activities that might cause injury while you are using this medicine. Severe falls or injuries can cause unseen bleeding. Be careful when using sharp tools or knives. Consider using an Copy. Take special care brushing or flossing your teeth. Report any injuries, bruising, or red spots on the skin to your doctor or health care professional. What side effects may I notice from receiving this medicine? Side effects that you should report to your doctor or health care professional as soon as possible: -allergic reactions like skin rash, itching or hives, swelling of the face, lips, or tongue -back pain -redness, blistering, peeling or loosening of the skin, including inside the mouth -signs and symptoms of bleeding such as bloody or black, tarry stools;  red or dark-brown urine; spitting up blood or brown material that looks like coffee grounds; red spots on the skin; unusual bruising or bleeding from the eye, gums, or nose Side effects that usually do not require medical attention (Report these to your doctor or health care professional if they continue or are bothersome.): -dizziness -muscle pain This list may not describe all  possible side effects. Call your doctor for medical advice about side effects. You may report side effects to FDA at 1-800-FDA-1088. Where should I keep my medicine? Keep out of the reach of children. Store at room temperature between 15 and 30 degrees C (59 and 86 degrees F). Throw away any unused medicine after the expiration date. NOTE: This sheet is a summary. It may not cover all possible information. If you have questions about this medicine, talk to your doctor, pharmacist, or health care provider.  2015, [employer]. (2013-05-07 18:47:48)

## 2013-12-18 NOTE — ED Notes (Signed)
PVL testing completed Patient in NAD

## 2013-12-18 NOTE — ED Notes (Signed)
Will DC patient home when Xarelto arrives

## 2013-12-18 NOTE — ED Notes (Signed)
PVL at bedside

## 2013-12-18 NOTE — ED Notes (Signed)
Pharmacy at bedside to provide patient education r/t Xarelto Booklet, handouts and patient information r/t medication given to patient  Patient medicated at time of DC, see MAR

## 2013-12-18 NOTE — ED Notes (Signed)
Patient asking for pain medication  Will make EDP aware Awaiting medication from Pharmacy

## 2013-12-18 NOTE — ED Notes (Signed)
Dr. Nanavati at bedside 

## 2013-12-18 NOTE — ED Notes (Addendum)
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 Kathrynn Humble at bedside

## 2013-12-18 NOTE — ED Provider Notes (Signed)
CSN: [id number]     Arrival date & time 12/18/13  1215 History   First MD Initiated Contact with Patient 12/18/13 1238     Chief Complaint  Patient presents with  . Leg Pain     (Consider location/radiation/quality/duration/timing/severity/associated sxs/prior Treatment) HPI Comments: Pt comes in with cc of leg pain. Hx of DVT, PE. S/p IVC filter, not on anticoagulant. Pt comes in with leg swelling x 1 day, left side. Pt has pain and swelling. No recent travels. Pt was taken off of coumadin as it was hard to control her inr.  Patient is a 68 y.o. female presenting with leg pain. The history is provided by the patient.  Leg Pain Location:  Leg Time since incident:  1 day Leg location:  L leg and L upper leg Associated symptoms: no neck pain     Past Medical History  Diagnosis Date  . Arthritis   . Glaucoma   . Clotting disorder    Past Surgical History  Procedure Laterality Date  . Cholecystectomy    . Spine surgery    . Abdominal hysterectomy     Family History  Problem Relation Age of Onset  . Diabetes Father   . Diabetes Maternal Grandmother   . Hypertension Mother   . Glaucoma Sister   . Cataracts Maternal Grandmother   . Cancer Sister     Breast   History  Substance Use Topics  . Smoking status: Current Every Day Smoker    Last Attempt to Quit: 06/30/2010  . Smokeless tobacco: Not on file  . Alcohol Use: 0.0 oz/week    0 Not specified per week     Comment: occ   OB History    No data available     Review of Systems  Constitutional: Positive for activity change.  Respiratory: Negative for shortness of breath.   Cardiovascular: Negative for chest pain.  Gastrointestinal: Negative for nausea, vomiting and abdominal pain.  Genitourinary: Negative for dysuria.  Musculoskeletal: Negative for neck pain.  Skin: Positive for rash.  Neurological: Negative for headaches.      Allergies  Review of patient's allergies indicates no known allergies.  Home  Medications   Prior to Admission medications   Medication Sig Start Date End Date Taking? Authorizing Provider  amoxicillin (AMOXIL) 875 MG tablet Take 1 tablet (875 mg total) by mouth 2 (two) times daily. 12/11/13  Yes Leandrew Koyanagi, MD  aspirin 81 MG tablet Take 81 mg by mouth at bedtime.   Yes Historical Provider, MD  atorvastatin (LIPITOR) 40 MG tablet Take 1 tablet (40 mg total) by mouth daily. 12/11/13  Yes Leandrew Koyanagi, MD  diltiazem (CARTIA XT) 120 MG 24 hr capsule Take 1 capsule (120 mg total) by mouth daily. 12/11/13  Yes Leandrew Koyanagi, MD  gabapentin (NEURONTIN) 300 MG capsule Take 1 capsule (300 mg total) by mouth at bedtime. After 7 days may increase to 2 at bedtime 12/11/13  Yes Leandrew Koyanagi, MD  HYDROcodone-homatropine [facility]) 5-1.5 MG/5ML syrup Take 5 mLs by mouth every 6 (six) hours as needed. 12/11/13  Yes Leandrew Koyanagi, MD  ibuprofen (ADVIL,MOTRIN) 200 MG tablet Take 600 mg by mouth every 6 (six) hours as needed for moderate pain (back pain).   Yes Historical Provider, MD  pantoprazole (PROTONIX) 40 MG tablet Take 1 tablet (40 mg total) by mouth daily. 12/11/13  Yes Leandrew Koyanagi, MD  propranolol ER (INDERAL LA) 120 MG 24 hr capsule Take 1 capsule (  120 mg total) by mouth daily. 12/11/13  Yes Leandrew Koyanagi, MD  pantoprazole (PROTONIX) 40 MG tablet TAKE 1 TABLET BY MOUTH DAILY 12/17/13   Mancel Bale, PA-C  XARELTO STARTER PACK 15 & 20 MG TBPK Take 15-20 mg by mouth as directed. Take as directed on package: Start with one 15mg  tablet by mouth twice a day with food. On Day 22, switch to one 20mg  tablet once a day with food. 12/18/13   Chenelle Benning, MD   BP 110/63 mmHg  Pulse 82  Temp(Src) 97.6 F (36.4 C) (Oral)  Resp 19  SpO2 94% Physical Exam  Constitutional: She is oriented to person, place, and time. She appears well-developed and well-nourished.  HENT:  Head: Normocephalic and atraumatic.  Eyes: EOM are normal. Pupils are equal,  round, and reactive to light.  Neck: Neck supple.  Cardiovascular: Normal rate, regular rhythm and normal heart sounds.   No murmur heard. Pulmonary/Chest: Effort normal. No respiratory distress.  Abdominal: Soft. She exhibits no distension. There is no tenderness. There is no rebound and no guarding.  Musculoskeletal: She exhibits edema and tenderness.  LLE edema with tenderness to palpation  Neurological: She is alert and oriented to person, place, and time.  Skin: Skin is warm and dry. Rash noted.  Nursing note and vitals reviewed.   ED Course  Procedures (including critical care time) Labs Review Labs Reviewed  BASIC METABOLIC PANEL - Abnormal; Notable for the following:    Sodium 135 (*)    Glucose, Bld 143 (*)    GFR calc non Af Amer 66 (*)    GFR calc Af Amer 77 (*)    All other components within normal limits  CBC WITH DIFFERENTIAL    Imaging Review No results found.   EKG Interpretation   Date/Time:  Friday December 18 2013 12:49:11 EST Ventricular Rate:  85 PR Interval:  152 QRS Duration: 86 QT Interval:  346 QTC Calculation: 411 R Axis:   3 Text Interpretation:  Sinus rhythm Low voltage, precordial leads No acute  findings Confirmed by Kathrynn Humble, MD, Thelma Comp [phone]) on 12/18/2013 2:39:16 PM      MDM   Final diagnoses:  Acute DVT (deep venous thrombosis), left    Pt comes in with cc of leg swelling. DDx: DVT Cellulitis Osteomyelitis Superficial thrombophlebitis Osteomyelitis Trauma  Based on the hx - DVT suspected and the scan is +. Pt started on xarelto. She really has no signs of submassive PE, and xarelto would help with a PE anyways, so pt started on xarelto, and advised close pcp f/u, as she might need consult, extra workup. Return precautions dicussed.  Varney Biles, MD 12/20/13 1730

## 2013-12-18 NOTE — ED Notes (Signed)
Dr. Kathrynn Humble called and made aware of PVL results

## 2013-12-18 NOTE — Progress Notes (Signed)
VASCULAR LAB PRELIMINARY  PRELIMINARY  PRELIMINARY  PRELIMINARY  Bilateral lower extremity venous duplex completed.    Preliminary report:  Bilateral exam completed pr protocol when DVT noted in requested extremity. Left - Positive for extensive acute DVT coursing for the posterior tibial vein through the popliteal, femoral, and common femoral vein. Superficial thrombus noted in the greater saphenous vein mid thigh through the saphenofemoral junction. No evidence of a Baker's cyst.  Right- Patient is known to have had a previous DVT 10 years ago. 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 No evidence of superficial thrombosis or Baker's cyst.  Nailah Luepke, RVS 12/18/2013, 2:09 PM

## 2013-12-21 ENCOUNTER — Ambulatory Visit (INDEPENDENT_AMBULATORY_CARE_PROVIDER_SITE_OTHER): Payer: Medicare Other

## 2013-12-21 ENCOUNTER — Ambulatory Visit (INDEPENDENT_AMBULATORY_CARE_PROVIDER_SITE_OTHER): Payer: Medicare Other | Admitting: Internal Medicine

## 2013-12-21 VITALS — BP 106/78 | HR 94 | Temp 97.6°F | Resp 16 | Ht 69.0 in | Wt 211.0 lb

## 2013-12-21 DIAGNOSIS — M545 Low back pain: Secondary | ICD-10-CM

## 2013-12-21 DIAGNOSIS — R079 Chest pain, unspecified: Secondary | ICD-10-CM

## 2013-12-21 DIAGNOSIS — R109 Unspecified abdominal pain: Secondary | ICD-10-CM

## 2013-12-21 DIAGNOSIS — I82409 Acute embolism and thrombosis of unspecified deep veins of unspecified lower extremity: Secondary | ICD-10-CM

## 2013-12-21 DIAGNOSIS — N390 Urinary tract infection, site not specified: Secondary | ICD-10-CM

## 2013-12-21 DIAGNOSIS — Z95828 Presence of other vascular implants and grafts: Secondary | ICD-10-CM

## 2013-12-21 DIAGNOSIS — Z9889 Other specified postprocedural states: Secondary | ICD-10-CM

## 2013-12-21 DIAGNOSIS — R8281 Pyuria: Secondary | ICD-10-CM

## 2013-12-21 LAB — POCT URINALYSIS DIPSTICK
GLUCOSE UA: NEGATIVE
Nitrite, UA: POSITIVE
SPEC GRAV UA: 1.02
UROBILINOGEN UA: 1
pH, UA: 5

## 2013-12-21 LAB — POCT UA - MICROSCOPIC ONLY
CASTS, UR, LPF, POC: NEGATIVE
Crystals, Ur, HPF, POC: NEGATIVE
Yeast, UA: NEGATIVE

## 2013-12-21 MED ORDER — SULFAMETHOXAZOLE-TRIMETHOPRIM 800-160 MG PO TABS: 1.0000 | ORAL_TABLET | Freq: Two times a day (BID) | ORAL | Status: DC

## 2013-12-21 MED ORDER — ONDANSETRON HCL 4 MG PO TABS: 4.0000 mg | ORAL_TABLET | Freq: Three times a day (TID) | ORAL | Status: DC | PRN

## 2013-12-21 MED ORDER — HYDROCODONE-ACETAMINOPHEN 5-325 MG PO TABS: 1.0000 | ORAL_TABLET | Freq: Four times a day (QID) | ORAL | Status: DC | PRN

## 2013-12-21 NOTE — Progress Notes (Addendum)
Subjective:  This chart was scribed for [employer]. Laney Pastor, MD by Terressa Koyanagi, ED Scribe. This patient was seen in room 1 and the patient's care was started at 4:17 PM.     Patient ID: [name], female    DOB: 1945-05-02, 68 y.o.   MRN: [id number]  Chief Complaint  Patient presents with  . DVT    11/20 AT [place]    HPI PCP: Leandrew Koyanagi, MD HPI Comments: [name] is a 68 y.o. female, who uses a cane to ambulate at baseline and with medical hx noted below, who presents to the [facility] for a f/u on her 12/18/13 ED visit where she was Dx with DVT.  Pt presented to the ED on 12/18/13 with the following complaints: (1) swelling to the left leg and (2) associated sudden onset, atraumatic, worsening left leg pain onset 12/17/13. The ED findings, including lab results, are noted below. Pt now presents with several complaints discussed below.   Radiating LE Pain and Swelling: Pt  complains of pain to both of her thighs radiating to the buttocks and lower back. Pt also complains of associated left foot swelling and associated pain. Pt reports she is unable to ambulate due to the pain. Pt notes she has not been able bear weight on her left side since 12/18/13. Past neuropathy L leg.  Chest Pain: Pt also complains of chest pain onset since her ED visit, tho similar to intermittent pain she has had frequently in the past. No diaphoresis or SOB (increased that is--has dyspnea at baseline). CP is ant, bilat, a fullness/ worse with some movements. Still smoking!!!!  N/V, Constipation, Urinary Sx and Abd Pain: Pt also complains of intermittent nausea and associated vomiting onset 2 days ago, with the last episode of emesis taking place yesterday morning. Pt further complains of associated decreased appetite due to nausea. Pt, however, denies any nausea during exam.   Pt further complains of constipation and loose stool when she is able to have a bowel  movement. Pt also reports decreased urination over the past 2 days ago with dribbling. Pt, however, denies dysuria. Pt further complains of lower, abd pain.   PMH Greenfield filter 2007 in [place] after PTE R DVT---she elected to stop coumadin spring 2014---  Lumbar stenosis w/ surgery Dr Tonita Cong 2012  Ambulates w/ cane til frid incr L leg pain  From ER visit-11/20: Based on the hx - DVT suspected and the scan is +.Findings consistent with chronic deep vein thrombosis involving the popliteal vein in the right lower extremity. - No evidence of superficial thrombosis involving the right lower extremity. - Findings consistent with acute deep vein thrombosis involving the posterior tibial vein coursing through the popliteal, femoral, and common femoral veins in the left lower extremity. - There is evidence of a superficial thrombosis of the left proximal greater saphenous vein coursing into the and including the saphenofemoral junction Pt started on xarelto. She really has no signs of submassive PE, and xarelto would help with a PE anyways, so pt started on xarelto, and advised close pcp f/u, as she might need consult, extra workup. Return precautions dicussed.  Varney Biles, MD 12/20/13 1730     Further, the following tests were completed at the ED:   Results for orders placed or performed during the hospital encounter of 12/18/13  CBC with Differential  Result Value Ref Range   WBC 7.6 4.0 - 10.5 K/uL  RBC 4.48 3.87 - 5.11 MIL/uL   Hemoglobin 14.3 12.0 - 15.0 g/dL   HCT 43.0 36.0 - 46.0 %   MCV 96.0 78.0 - 100.0 fL   MCH 31.9 26.0 - 34.0 pg   MCHC 33.3 30.0 - 36.0 g/dL   RDW 12.9 11.5 - 15.5 %   Platelets 177 150 - 400 K/uL   Neutrophils Relative % 74 43 - 77 %   Neutro Abs 5.7 1.7 - 7.7 K/uL   Lymphocytes Relative 16 12 - 46 %   Lymphs Abs 1.2 0.7 - 4.0 K/uL   Monocytes Relative 8 3 - 12 %   Monocytes Absolute 0.6 0.1 - 1.0 K/uL   Eosinophils Relative  2 0 - 5 %   Eosinophils Absolute 0.1 0.0 - 0.7 K/uL   Basophils Relative 0 0 - 1 %   Basophils Absolute 0.0 0.0 - 0.1 K/uL  Basic metabolic panel  Result Value Ref Range   Sodium 135 (L) 137 - 147 mEq/L   Potassium 4.2 3.7 - 5.3 mEq/L   Chloride 98 96 - 112 mEq/L   CO2 22 19 - 32 mEq/L   Glucose, Bld 143 (H) 70 - 99 mg/dL   BUN 8 6 - 23 mg/dL   Creatinine, Ser 0.89 0.50 - 1.10 mg/dL   Calcium 9.6 8.4 - 10.5 mg/dL   GFR calc non Af Amer 66 (L) >90 mL/min   GFR calc Af Amer 77 (L) >90 mL/min   Anion gap 15 5 - 15     Past Medical History  Diagnosis Date  . Arthritis   . Glaucoma   . Clotting disorder   . DVT (deep venous thrombosis)     LEFT   Current Outpatient Prescriptions on File Prior to Visit  Medication Sig Dispense Refill  . atorvastatin (LIPITOR) 40 MG tablet Take 1 tablet (40 mg total) by mouth daily. 90 tablet 1  . diltiazem (CARTIA XT) 120 MG 24 hr capsule Take 1 capsule (120 mg total) by mouth daily. 90 capsule 1  . gabapentin (NEURONTIN) 300 MG capsule Take 1 capsule (300 mg total) by mouth at bedtime. After 7 days may increase to 2 at bedtime 60 capsule 2  . pantoprazole (PROTONIX) 40 MG tablet Take 1 tablet (40 mg total) by mouth daily. 90 tablet 1  . pantoprazole (PROTONIX) 40 MG tablet TAKE 1 TABLET BY MOUTH DAILY 30 tablet 3  . propranolol ER (INDERAL LA) 120 MG 24 hr capsule Take 1 capsule (120 mg total) by mouth daily. 30 capsule 2  . XARELTO STARTER PACK 15 & 20 MG TBPK Take 15-20 mg by mouth as directed. Take as directed on package: Start with one 15mg  tablet by mouth twice a day with food. On Day 22, switch to one 20mg  tablet once a day with food. 51 each 0  . amoxicillin (AMOXIL) 875 MG tablet Take 1 tablet (875 mg total) by mouth 2 (two) times daily. (Patient not taking: Reported on 12/21/2013) 20 tablet 0  . aspirin 81 MG tablet Take 81 mg by mouth at bedtime.    Marland Kitchen HYDROcodone-homatropine (HYCODAN) 5-1.5 MG/5ML syrup Take 5 mLs by mouth every 6 (six)  hours as needed. (Patient not taking: Reported on 12/21/2013) 120 mL 0  . ibuprofen (ADVIL,MOTRIN) 200 MG tablet Take 600 mg by mouth every 6 (six) hours as needed for moderate pain (back pain).     No current facility-administered medications on file prior to visit.    No Known Allergies  Review of Systems  Constitutional: Negative for fever, chills and diaphoresis.  HENT: Negative for trouble swallowing.   Eyes: Negative for visual disturbance.  Respiratory: Negative for cough, shortness of breath and wheezing.   Cardiovascular: Negative for palpitations. Leg swelling: left foot swelling.  Genitourinary: Difficulty urinating: minimal flow of urine.  Musculoskeletal: Positive for back pain and gait problem. Negative for neck pain.       LE pain  Skin: Negative for rash.  Neurological: Positive for tremors ( see hx last ov). Negative for speech difficulty, light-headedness and headaches.  Psychiatric/Behavioral: Negative for confusion and sleep disturbance.       Objective:   Physical Exam  Constitutional: She is oriented to person, place, and time. She appears distressed.  Pt is anxious and in mild to moderate pain.  Obese. Smells of smoke.  Eyes: Conjunctivae and EOM are normal. Pupils are equal, round, and reactive to light.  Neck: Neck supple.  Cardiovascular: Normal rate, regular rhythm and normal heart sounds.  Exam reveals no gallop and no friction rub.   No murmur heard. Dorsalis pedis pulses are diminished bilat. Feet warm. No carotid or abd bruits.  Pulmonary/Chest: Effort normal and breath sounds normal. No respiratory distress. She has no wheezes.  Abdominal: Soft. Bowel sounds are normal. She exhibits no mass. There is tenderness. There is no rebound and no guarding.  Tender to palpation all lower quadrants and periumbilical. No bruit, no masses. Liver edge palpable and non-tender.   Musculoskeletal: She exhibits edema and tenderness.  Left leg edema in LE; minimal  pitting.  Tender to palp: posterior thigh; lumbar area; and over both buttocks.  Straight leg raise positive at 90 degrees, billaterally. Sensation is intact in both feet. Neither extremity is cold.   Neurological: She is alert and oriented to person, place, and time. No cranial nerve deficit.  Psychiatric: Judgment and thought content normal.    Triage Vitals:  Filed Vitals:   12/21/13 1603  BP: 106/78  Pulse: 94  Temp: 97.6 F (36.4 C)  Resp: 16  pO2=100%   [facility] reading (PRIMARY) by  Dr. Chrissie Noa clear//Abd =NAD//LS spine L4 sl forward on L5-prior surgery Greenfield filter in place//bone demineralization present Results for orders placed or performed in visit on 12/21/13  POCT UA - Microscopic Only  Result Value Ref Range   WBC, Ur, HPF, POC 15-20    RBC, urine, microscopic 8-12    Bacteria, U Microscopic 1+    Mucus, UA trace    Epithelial cells, urine per micros 8-12    Crystals, Ur, HPF, POC neg    Casts, Ur, LPF, POC neg    Yeast, UA neg   POCT urinalysis dipstick  Result Value Ref Range   Color, UA yellow    Clarity, UA cloudy    Glucose, UA neg    Bilirubin, UA mod    Ketones, UA trace    Spec Grav, UA 1.020    Blood, UA mod    pH, UA 5.0    Protein, UA trace    Urobilinogen, UA 1.0    Nitrite, UA positive    Leukocytes, UA Trace     31min OV    Assessment & Plan:     DVT (deep venous thrombosis) L femoral to popliteal at least--contin xarelto(disc w/ vasc surgery)  Chest pain, unspecified chest pain type -  Abdominal pain, unspecified abdominal location -   Low back pain without sciatica, unspecified back pain laterality -  Pyuria -UTI: Urine  culture sent . Start SXT-DS  Greenfield filter in place - Plan: CT Abdomen venogram to r/o clotting to edge of filter or more extensive clotting in abd and r/o longer term complications, such as filter erosion/migration or embolization, and chronic thrombosis  I personally performed the services  described in this documentation, which was scribed in my presence. The recorded information has been reviewed and is accurate.  Disc emergent sxt that would require immed scan Meds ordered this encounter  Medications  . sulfamethoxazole-trimethoprim (BACTRIM DS,SEPTRA DS) 800-160 MG per tablet    Sig: Take 1 tablet by mouth 2 (two) times daily.    Dispense:  20 tablet    Refill:  0  . HYDROcodone-acetaminophen (NORCO/VICODIN) 5-325 MG per tablet    Sig: Take 1 tablet by mouth every 6 (six) hours as needed for moderate pain. To use at hs so can sleep    Dispense:  10 tablet    Refill:  0  . ondansetron (ZOFRAN) 4 MG tablet    Sig: Take 1 tablet (4 mg total) by mouth every 8 (eight) hours as needed for nausea or vomiting.    Dispense:  10 tablet    Refill:  0

## 2013-12-22 ENCOUNTER — Telehealth: Payer: Self-pay | Admitting: Family Medicine

## 2013-12-22 ENCOUNTER — Encounter: Payer: Self-pay | Admitting: Physician Assistant

## 2013-12-22 ENCOUNTER — Telehealth: Payer: Self-pay | Admitting: *Deleted

## 2013-12-22 ENCOUNTER — Ambulatory Visit
Admission: RE | Admit: 2013-12-22 | Discharge: 2013-12-22 | Disposition: A | Discharge status: Home / Self Care | Payer: Medicare Other | Source: Ambulatory Visit | Attending: Internal Medicine | Admitting: Internal Medicine

## 2013-12-22 DIAGNOSIS — I82409 Acute embolism and thrombosis of unspecified deep veins of unspecified lower extremity: Secondary | ICD-10-CM

## 2013-12-22 DIAGNOSIS — K8689 Other specified diseases of pancreas: Secondary | ICD-10-CM

## 2013-12-22 DIAGNOSIS — Z95828 Presence of other vascular implants and grafts: Secondary | ICD-10-CM

## 2013-12-22 DIAGNOSIS — R109 Unspecified abdominal pain: Secondary | ICD-10-CM

## 2013-12-22 MED ORDER — IOHEXOL 350 MG/ML SOLN
125.0000 mL | Freq: Once | INTRAVENOUS | Status: AC | PRN
  Administered 2013-12-22: 125 mL via INTRAVENOUS

## 2013-12-22 NOTE — Telephone Encounter (Signed)
Order needed to be changed again to include the pelvis.

## 2013-12-22 NOTE — Telephone Encounter (Signed)
MRI results are back.  Please review.  Pt does not know about results.  I reviewed 11/24 at 9:15 and felt that you should review and it would be ok to wait to contact the patient.

## 2013-12-22 NOTE — Telephone Encounter (Signed)
Had to change the order for the scan per protocol for [facility].

## 2013-12-23 NOTE — Telephone Encounter (Signed)
Please call---clot is stable and does not involve extension into the abdomen However there is a small swelling on the pancreas without clear definition and we need to do a different type of scan to see what it actually is--an MRI--can I set this up for her??

## 2013-12-23 NOTE — Telephone Encounter (Signed)
Tried to call pt, but no ans/no VM. Will try again later.

## 2013-12-24 LAB — URINE CULTURE

## 2013-12-25 NOTE — Telephone Encounter (Signed)
I was not able to reach pt later on the 25th either, but was able to reach pt today and explained results. She agreed to MRI to further investigate swelling on pancreas. Dr Laney Pastor, I didn't know what test you wanted to order specifically?

## 2013-12-28 ENCOUNTER — Other Ambulatory Visit: Payer: Self-pay | Admitting: *Deleted

## 2013-12-28 DIAGNOSIS — K8689 Other specified diseases of pancreas: Secondary | ICD-10-CM

## 2013-12-31 ENCOUNTER — Telehealth: Payer: Self-pay

## 2013-12-31 ENCOUNTER — Ambulatory Visit (INDEPENDENT_AMBULATORY_CARE_PROVIDER_SITE_OTHER): Payer: Medicare Other | Admitting: Family Medicine

## 2013-12-31 VITALS — BP 122/70 | HR 77 | Temp 97.9°F | Resp 20 | Ht 67.5 in | Wt 212.6 lb

## 2013-12-31 DIAGNOSIS — I82402 Acute embolism and thrombosis of unspecified deep veins of left lower extremity: Secondary | ICD-10-CM

## 2013-12-31 DIAGNOSIS — T7840XA Allergy, unspecified, initial encounter: Secondary | ICD-10-CM

## 2013-12-31 MED ORDER — LORATADINE 10 MG PO TABS: 10.0000 mg | ORAL_TABLET | Freq: Every day | ORAL | Status: DC

## 2013-12-31 MED ORDER — CETIRIZINE HCL 10 MG PO TABS
10.0000 mg | ORAL_TABLET | Freq: Once | ORAL | Status: AC
  Administered 2013-12-31: 10 mg via ORAL

## 2013-12-31 MED ORDER — PREDNISONE 20 MG PO TABS: ORAL_TABLET | ORAL | Status: DC

## 2013-12-31 MED ORDER — HYDROCODONE-ACETAMINOPHEN 5-325 MG PO TABS: 1.0000 | ORAL_TABLET | Freq: Four times a day (QID) | ORAL | Status: DC | PRN

## 2013-12-31 NOTE — Patient Instructions (Signed)

## 2013-12-31 NOTE — Telephone Encounter (Signed)
Daughter, Angeliah (same name as mother)  Would like a call back regarding patients medication and affliction.  [phone]

## 2013-12-31 NOTE — Telephone Encounter (Signed)
Daughter called back. Says pt has hives and facial/lip swelling and she thinks it's from Xarelto. Advised RTC asap. Daughter is bringing her in now

## 2013-12-31 NOTE — Progress Notes (Signed)
Subjective:    Patient ID: [name], female    DOB: [birth year], 68 y.o.   MRN: [id number]  12/31/2013  Facial Swelling   HPI This 68 y.o. female presents with daughter for evaluation of facial swelling.   Onset two days ago. Recently diagnosed with DVT and UTI; treated with Xarelto and Bactrim.   Finished antibiotic the day prior.  Facial swelling starting last night; started with hives on arms and back and torso.  Daughter applied cream to area because worried about bites. Then hands started swelling.  Swelling has slightly worsened today.  Daughter has been keeping an eye on patient and eyelid swelling has worsened.  R side lower area of face is larger.  No tongue swelling. No throat swelling.   Top lip was puffy but has improved some.  SOB chronic without recent worsening.  Hives have improved.  Daughter bought Benadryl earlier today at 1:30pm with improvement with whelps on skin; whelps/hives have resolved; facial swelling has improved but is still present.   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   Gabapentin started on 12/11/13 for neuropathy; previously took Lyrica but stopped due to expense.  Patient requesting refill of hydrocodone.    Review of Systems  Constitutional: Negative for fever, chills, diaphoresis and fatigue.  HENT: Positive for facial swelling. Negative for mouth sores and trouble swallowing.   Respiratory: Negative for choking, chest tightness, shortness of breath, wheezing and stridor.   Cardiovascular: Negative for chest pain.  Skin: Positive for color change and rash.  Neurological: Negative for dizziness, light-headedness and headaches.    Past Medical History  Diagnosis Date  . Arthritis   . Glaucoma   . Clotting disorder   . DVT (deep venous thrombosis)     LEFT  . GERD (gastroesophageal reflux disease)   . Hypercholesterolemia   . Peripheral neuropathy   . Essential tremor    Past Surgical History  Procedure  Laterality Date  . Cholecystectomy    . Spine surgery    . Abdominal hysterectomy    . Breast lumpectomy Right   . Knee surgery Bilateral     Current Outpatient Prescriptions  Medication Sig Dispense Refill  . aspirin 81 MG tablet Take 81 mg by mouth at bedtime.    Marland Kitchen atorvastatin (LIPITOR) 40 MG tablet Take 1 tablet (40 mg total) by mouth daily. 90 tablet 1  . diltiazem (CARTIA XT) 120 MG 24 hr capsule Take 1 capsule (120 mg total) by mouth daily. 90 capsule 1  . gabapentin (NEURONTIN) 300 MG capsule Take 1 capsule (300 mg total) by mouth at bedtime. After 7 days may increase to 2 at bedtime (Patient taking differently: Take 300 mg by mouth at bedtime. ) 60 capsule 2  . HYDROcodone-acetaminophen (NORCO/VICODIN) 5-325 MG per tablet Take 1 tablet by mouth every 6 (six) hours as needed for moderate pain. 10 tablet 0  . ibuprofen (ADVIL,MOTRIN) 200 MG tablet Take 600 mg by mouth every 6 (six) hours as needed for moderate pain (back pain).    . pantoprazole (PROTONIX) 40 MG tablet TAKE 1 TABLET BY MOUTH DAILY 30 tablet 3  . propranolol ER (INDERAL LA) 120 MG 24 hr capsule Take 1 capsule (120 mg total) by mouth daily. 30 capsule 2  . XARELTO STARTER PACK 15 & 20 MG TBPK Take 15-20 mg by mouth as directed. Take as directed on package: Start with one 15mg  tablet by mouth twice a day with food.  On Day 22, switch to one 20mg  tablet once a day with food. 51 each 0  . loratadine (CLARITIN) 10 MG tablet Take 1 tablet (10 mg total) by mouth daily. 14 tablet 0  . predniSONE (DELTASONE) 20 MG tablet Two tablets daily x 5 days then one tablet daily x 5 days 15 tablet 0  . primidone (MYSOLINE) 50 MG tablet Take 1 tablet (50 mg total) by mouth at bedtime. 90 tablet 1   No current facility-administered medications for this visit.       Objective:    BP 122/70 mmHg  Pulse 77  Temp(Src) 97.9 F (36.6 C) (Oral)  Resp 20  Ht 5' 7.5" (1.715 m)  Wt 212 lb 9.6 oz (96.435 kg)  BMI 32.79 kg/m2  SpO2  99% Physical Exam  Constitutional: She is oriented to person, place, and time. She appears well-developed and well-nourished. No distress.  HENT:  Head: Normocephalic and atraumatic.  Right Ear: External ear normal.  Left Ear: External ear normal.  Nose: Nose normal.  Mouth/Throat: Oropharynx is clear and moist. No oropharyngeal exudate.  Mild facial swelling periorbital region and R mandible.  Eyes: Conjunctivae are normal. Pupils are equal, round, and reactive to light.  Neck: Normal range of motion. Neck supple. No tracheal deviation present. No thyromegaly present.  Cardiovascular: Normal rate, regular rhythm and normal heart sounds.  Exam reveals no gallop and no friction rub.   No murmur heard. Pulmonary/Chest: Effort normal and breath sounds normal. No stridor. She has no wheezes. She has no rales.  Lymphadenopathy:    She has no cervical adenopathy.  Neurological: She is alert and oriented to person, place, and time.  Skin: Skin is warm and dry. No rash noted. She is not diaphoretic.  Psychiatric: She has a normal mood and affect. Her behavior is normal.  Nursing note and vitals reviewed.  ZYRTEC 10MG  PO ADMINISTERED IN OFFICE.     Assessment & Plan:   1. Allergic reaction, initial encounter   2. DVT (deep venous thrombosis), left     1. Allergic Reaction initial encounter:  New.  Likely to SULFA abx prescribed for UTI.  Rx for Prednisone provided; rx for Claritin 10mg  daily provided as well. Take Benadryl PRN itching or swelling.  To ED for development of tongue swelling, SOB, throat swelling. 2. DVT: recurrent; maintained on Xarelto; do not think current symptoms due to Xarelto.  Rx for hydrocodone provided for persistent pain.    Meds ordered this encounter  Medications  . loratadine (CLARITIN) 10 MG tablet    Sig: Take 1 tablet (10 mg total) by mouth daily.    Dispense:  14 tablet    Refill:  0  . predniSONE (DELTASONE) 20 MG tablet    Sig: Two tablets daily x 5  days then one tablet daily x 5 days    Dispense:  15 tablet    Refill:  0  . HYDROcodone-acetaminophen (NORCO/VICODIN) 5-325 MG per tablet    Sig: Take 1 tablet by mouth every 6 (six) hours as needed for moderate pain.    Dispense:  10 tablet    Refill:  0  . cetirizine (ZYRTEC) tablet 10 mg    Sig:     No Follow-up on file.    Reginia Forts, M.D.  [facility] [address] [place], [place]  [address] [phone] phone [phone] fax

## 2014-01-04 ENCOUNTER — Encounter: Payer: Self-pay | Admitting: Neurology

## 2014-01-04 ENCOUNTER — Telehealth: Payer: Self-pay | Admitting: Neurology

## 2014-01-04 ENCOUNTER — Ambulatory Visit (INDEPENDENT_AMBULATORY_CARE_PROVIDER_SITE_OTHER): Payer: Medicare Other | Admitting: Neurology

## 2014-01-04 VITALS — BP 120/80 | HR 80 | Ht 69.5 in | Wt 218.0 lb

## 2014-01-04 DIAGNOSIS — I82402 Acute embolism and thrombosis of unspecified deep veins of left lower extremity: Secondary | ICD-10-CM

## 2014-01-04 DIAGNOSIS — G250 Essential tremor: Secondary | ICD-10-CM

## 2014-01-04 DIAGNOSIS — W19XXXA Unspecified fall, initial encounter: Secondary | ICD-10-CM

## 2014-01-04 DIAGNOSIS — R270 Ataxia, unspecified: Secondary | ICD-10-CM

## 2014-01-04 DIAGNOSIS — Z5181 Encounter for therapeutic drug level monitoring: Secondary | ICD-10-CM

## 2014-01-04 LAB — COMPREHENSIVE METABOLIC PANEL
ALBUMIN: 3.6 g/dL (ref 3.5–5.2)
ALK PHOS: 80 U/L (ref 39–117)
ALT: 10 U/L (ref 0–35)
AST: 29 U/L (ref 0–37)
BUN: 11 mg/dL (ref 6–23)
CO2: 26 mEq/L (ref 19–32)
Calcium: 9 mg/dL (ref 8.4–10.5)
Chloride: 104 mEq/L (ref 96–112)
Creat: 0.99 mg/dL (ref 0.50–1.10)
GLUCOSE: 88 mg/dL (ref 70–99)
POTASSIUM: 4.1 meq/L (ref 3.5–5.3)
Sodium: 136 mEq/L (ref 135–145)
Total Bilirubin: 0.5 mg/dL (ref 0.2–1.2)
Total Protein: 6.8 g/dL (ref 6.0–8.3)

## 2014-01-04 LAB — TSH: TSH: 1.933 u[IU]/mL (ref 0.350–4.500)

## 2014-01-04 MED ORDER — PRIMIDONE 50 MG PO TABS: 50.0000 mg | ORAL_TABLET | Freq: Every day | ORAL | Status: DC

## 2014-01-04 NOTE — Patient Instructions (Addendum)
1. Your provider has requested that you have labwork completed today. Please go to [facility] on the first floor of this building before leaving the office today. 2. We have scheduled you at [facility] for your MRI on 01/09/14 at 9:15 am. Please arrive 30 minutes prior and go to [facility]. If you need to change this appt please call [phone].

## 2014-01-04 NOTE — Progress Notes (Signed)
Subjective:   [name] was seen in consultation in the movement disorder clinic at the request of [name], Barbara Ham, MD.  The evaluation is for tremor.  The records that were made available to me were reviewed.  She has also previously seen Dr. Janann [name].  She is accompanied by her daughter who supplements the history.    The patient is a 68 y.o. right handed female with a history of tremor.   Pt reports that she has had tremor since childhood.  Tremor has slowly progressed with time.  Tremor is "alot" in the right and "slightly" in the L.  Her daughter states that she notices both a lot.  She notices the tremor "a whole lot" when the hands are in use, but she has it at rest as well.  She is on inderal LA 60 mg for the treatment of tremor.  This was started by Dr. Janann [name] in 04/2013.  Pt states that she didn't notice any difference with that so her PCP increased it to 120 mg on 12/11/13.  She noticed no help with that either, but no SE.    There is no family hx of tremor.    Affected by caffeine:  No. (only drinks caffeine 1-2 times per month) Affected by alcohol:  Yes.   Affected by stress:  Yes.   Affected by fatigue:  Yes.   Spills soup if on spoon:  Yes.   Spills glass of liquid if full:  Yes.   Affects ADL's (tying shoes, brushing teeth, etc):  Yes.     Pt also has a hx of PN.  She was tried on lyrica but it was too expensive and was changed to neurontin on 12/11/13 per records.  This seems to be helping and she is able to go to sleep at night now.  She is taking 300 mg at night.  Prior to that she was having numbness and pins and would have to hang the feet over the bed at night.    Pt is currently on prednisone but doesn't take that chronically.  She was on a sulfa antibiotic for UTI and she had swelling and a rash from it and it was determined that she had a sulfa allergy.  She has about 5 days left of prednisone and she isn't sure if that made tremor worse.    Was also recently dx  with acute DVT of the L leg on 12/18/13 and is now on Xarelto.  Is also being w/u for pancreatic mass with another scan tomorrow.  Outside reports reviewed: historical medical records, lab reports and referral letter/letters.  Allergies  Allergen Reactions  . Sulfa Antibiotics Swelling    Outpatient Encounter Prescriptions as of 01/04/2014  Medication Sig  . aspirin 81 MG tablet Take 81 mg by mouth at bedtime.  Marland Kitchen atorvastatin (LIPITOR) 40 MG tablet Take 1 tablet (40 mg total) by mouth daily.  Marland Kitchen diltiazem (CARTIA XT) 120 MG 24 hr capsule Take 1 capsule (120 mg total) by mouth daily.  Marland Kitchen gabapentin (NEURONTIN) 300 MG capsule Take 1 capsule (300 mg total) by mouth at bedtime. After 7 days may increase to 2 at bedtime (Patient taking differently: Take 300 mg by mouth at bedtime. )  . HYDROcodone-acetaminophen (NORCO/VICODIN) 5-325 MG per tablet Take 1 tablet by mouth every 6 (six) hours as needed for moderate pain.  Marland Kitchen ibuprofen (ADVIL,MOTRIN) 200 MG tablet Take 600 mg by mouth every 6 (six) hours as needed for moderate pain (  back pain).  Marland Kitchen loratadine (CLARITIN) 10 MG tablet Take 1 tablet (10 mg total) by mouth daily.  . pantoprazole (PROTONIX) 40 MG tablet TAKE 1 TABLET BY MOUTH DAILY  . predniSONE (DELTASONE) 20 MG tablet Two tablets daily x 5 days then one tablet daily x 5 days  . propranolol ER (INDERAL LA) 120 MG 24 hr capsule Take 1 capsule (120 mg total) by mouth daily.  Alveda Reasons STARTER PACK 15 & 20 MG TBPK Take 15-20 mg by mouth as directed. Take as directed on package: Start with one 15mg  tablet by mouth twice a day with food. On Day 22, switch to one 20mg  tablet once a day with food.  . [DISCONTINUED] HYDROcodone-homatropine (HYCODAN) 5-1.5 MG/5ML syrup Take 5 mLs by mouth every 6 (six) hours as needed.  . [DISCONTINUED] ondansetron (ZOFRAN) 4 MG tablet Take 1 tablet (4 mg total) by mouth every 8 (eight) hours as needed for nausea or vomiting.    Past Medical History  Diagnosis Date    . Arthritis   . Glaucoma   . Clotting disorder   . DVT (deep venous thrombosis)     LEFT    Past Surgical History  Procedure Laterality Date  . Cholecystectomy    . Spine surgery    . Abdominal hysterectomy      History   Social History  . Marital Status: Divorced    Spouse Name: N/A    Number of Children: N/A  . Years of Education: N/A   Occupational History  . Not on file.   Social History Main Topics  . Smoking status: Current Every Day Smoker    Last Attempt to Quit: 06/30/2010  . Smokeless tobacco: Not on file  . Alcohol Use: 0.0 oz/week    0 Not specified per week     Comment: occ  . Drug Use: No  . Sexual Activity: Not on file   Other Topics Concern  . Not on file   Social History Narrative   Divorced, 3 children   Right handed   Associates degree   3 per week    Family Status  Relation Status Death Age  . Mother Deceased 7    Heart problems  . Father Deceased 4    Prostate/Bladder Cancer  . Sister Alive   . Daughter Alive   . Son Alive   . Maternal Grandmother Deceased   . Maternal Grandfather Deceased   . Paternal Grandmother Deceased   . Paternal Grandfather Deceased     Review of Systems A complete 10 system ROS was obtained and was negative apart from what is mentioned.   Objective:   VITALS:   Filed Vitals:   01/04/14 0935  BP: 120/80  Pulse: 80  Height: 5' 9.5" (1.765 m)  Weight: 218 lb (98.884 kg)   Gen:  Appears stated age and in NAD. HEENT:  Normocephalic, atraumatic. The mucous membranes are moist. The superficial temporal arteries are without ropiness or tenderness. Cardiovascular: Regular rate and rhythm. Lungs: Clear to auscultation bilaterally. Neck: There are no carotid bruits noted bilaterally.  NEUROLOGICAL:  Orientation:  The patient is alert and oriented x 3.  Recent and remote memory are intact.  Attention span and concentration are normal.  Able to name objects and repeat without trouble.  Fund of  knowledge is appropriate Cranial nerves: There is good facial symmetry. The pupils are equal round and reactive to light bilaterally. Fundoscopic exam reveals clear disc margins bilaterally. Extraocular muscles are intact and  visual fields are full to confrontational testing. Speech is fluent and clear. Soft palate rises symmetrically and there is no tongue deviation. Hearing is intact to conversational tone. Tone: Tone is good throughout. Sensation: Sensation is intact to light touch and pinprick throughout (facial, trunk, extremities). Vibration is absent at the bilateral big toe and ankle (decreased distally). There is no extinction with double simultaneous stimulation. There is no sensory dermatomal level identified. Coordination:  The patient has no dysdiadichokinesia or dysmetria. Motor: Strength is 5/5 in the bilateral upper and at least 4/5 in the bilateral lower extremities.  Shoulder shrug is equal bilaterally.  There is no pronator drift.  There are no fasciculations noted. DTR's: Deep tendon reflexes are 2+/4 at the bilateral biceps, triceps, brachioradialis, patella and absent at the bilateral achilles.  Plantar responses are downgoing bilaterally. Gait and Station: The patient ambulates with a wide based gait with her walker (which appears set too low).  She is mildly ataxic.    MOVEMENT EXAM: Tremor:  There is  tremor in the UE, noted most significantly with action.  The right is moderate and the L is mild.  The patient is not able to draw Archimedes spirals without significant difficulty on the L.  There is no tremor at rest.  The patient is not able to pour water from one glass to another without spilling it.  There is intermittent mild head tremor.  Lab Results  Component Value Date   TSH 1.951 08/07/2012     Chemistry      Component Value Date/Time   NA 135* 12/18/2013 1306   K 4.2 12/18/2013 1306   CL 98 12/18/2013 1306   CO2 22 12/18/2013 1306   BUN 8 12/18/2013 1306    CREATININE 0.89 12/18/2013 1306   CREATININE 0.66 05/03/2013 1306      Component Value Date/Time   CALCIUM 9.6 12/18/2013 1306   ALKPHOS 109 05/03/2013 1306   AST 26 05/03/2013 1306   ALT 8 05/03/2013 1306   BILITOT 0.6 05/03/2013 1306       Lab Results  Component Value Date   WBC 7.6 12/18/2013   HGB 14.3 12/18/2013   HCT 43.0 12/18/2013   MCV 96.0 12/18/2013   PLT 177 12/18/2013   Lab Results  Component Value Date   HGBA1C 5.3 05/03/2013        Assessment/Plan:   1.  Essential Tremor.  -This is evidenced by the symmetrical nature and longstanding hx of gradually getting worse.  I talked to her about whether she wants to hold on starting medication until after she gets the result of her abdominal MRI back to see whether or not she has a pancreatic adenocarcinoma.  She states that no matter what the results, she would still wants something for her tremor as it has become disabling.  We will slowly add low-dose primidone, 50 mg, half tablet at night for 4 nights and then if tolerated we can try to go up to one tablet at night.  Risks, benefits, side effects and alternative therapies were discussed.  The opportunity to ask questions was given and they were answered to the best of my ability.  The patient expressed understanding and willingness to follow the outlined treatment protocols.  -I will send a message to her primary care physician to see if we can stop the propranolol in a few weeks, as it does not seem to be helping tremor.  I do not want to stop it the same, start the  primidone, however. 2.  Peripheral neuropathy  -This is likely the cause of the gait instability and ataxia.  However, given her other recent medical problems, I am going to go ahead and do an MRI of the brain to make sure I am not missing anything more.  She had a recent DVT and is being worked up for pancreatic adenocarcinoma.  I encouraged her to use her walker at all times.  I encouraged her to get the  height adjusted properly. 3.  Follow-up in the next few months, sooner should new neurologic issues arise.

## 2014-01-04 NOTE — Telephone Encounter (Signed)
Percell Miller, pt's daughter called regarding the script Dr. Carles Collet Rx her today. (Pt does not remember the name) Please call pt's daughter # [phone]

## 2014-01-04 NOTE — Telephone Encounter (Signed)
Spoke with patient and made her aware that medication is Primidone and this has been sent to her pharmacy. She requested 3 month supply. Aware to take 1/2 tablet for 4 nights before increasing to whole tablet. Aware she could have dizziness and nausea with first dose. She will call with any problems.

## 2014-01-05 ENCOUNTER — Ambulatory Visit
Admission: RE | Admit: 2014-01-05 | Discharge: 2014-01-05 | Disposition: A | Discharge status: Home / Self Care | Payer: Medicare Other | Source: Ambulatory Visit | Attending: Internal Medicine | Admitting: Internal Medicine

## 2014-01-05 DIAGNOSIS — K8689 Other specified diseases of pancreas: Secondary | ICD-10-CM

## 2014-01-05 MED ORDER — GADOBENATE DIMEGLUMINE 529 MG/ML IV SOLN
19.0000 mL | Freq: Once | INTRAVENOUS | Status: AC | PRN
  Administered 2014-01-05: 19 mL via INTRAVENOUS

## 2014-01-09 ENCOUNTER — Ambulatory Visit
Admission: RE | Admit: 2014-01-09 | Discharge: 2014-01-09 | Disposition: A | Discharge status: Home / Self Care | Payer: Medicare Other | Source: Ambulatory Visit | Attending: Neurology | Admitting: Neurology

## 2014-01-09 DIAGNOSIS — W19XXXA Unspecified fall, initial encounter: Secondary | ICD-10-CM

## 2014-01-09 DIAGNOSIS — G250 Essential tremor: Secondary | ICD-10-CM

## 2014-01-09 DIAGNOSIS — R270 Ataxia, unspecified: Secondary | ICD-10-CM

## 2014-01-11 ENCOUNTER — Telehealth: Payer: Self-pay | Admitting: Neurology

## 2014-01-11 ENCOUNTER — Telehealth: Payer: Self-pay | Admitting: *Deleted

## 2014-01-11 ENCOUNTER — Other Ambulatory Visit: Payer: Self-pay | Admitting: *Deleted

## 2014-01-11 ENCOUNTER — Telehealth: Payer: Self-pay

## 2014-01-11 DIAGNOSIS — K8689 Other specified diseases of pancreas: Secondary | ICD-10-CM

## 2014-01-11 DIAGNOSIS — I82409 Acute embolism and thrombosis of unspecified deep veins of unspecified lower extremity: Secondary | ICD-10-CM | POA: Insufficient documentation

## 2014-01-11 NOTE — Telephone Encounter (Signed)
Patient has seen her MRI results on mychart and is very concerned. Her MRI was done on 01/05/14 and Dr Laney Pastor has reviewed them.

## 2014-01-11 NOTE — Telephone Encounter (Signed)
Pt notified of results of MRI from 01/05/14 and referral made to surgeon.

## 2014-01-11 NOTE — Telephone Encounter (Signed)
Patient made aware.

## 2014-01-11 NOTE — Telephone Encounter (Signed)
-----   Message from [place], DO sent at 01/11/2014  8:50 AM EST ----- Reviewed.  Mild number of T2 hyperintensities.  Kyrah Schiro, let pt know that MRI brain looks fine, nothing bad.

## 2014-01-12 NOTE — Telephone Encounter (Signed)
I didn't order her MRI of the abdomen, if that is the MRI you are speaking of.  My office called her yesterday and spoke with her regarding her MRI of the brain which was fine for her age.  Her MRI of the abdomen certainly needs addressed by the ordering physician, but this is far out of my field as a neurologist.

## 2014-01-12 NOTE — Telephone Encounter (Signed)
Patients records have been sent to [facility]. Considering this referral was put in yesterday (01/11/14), we just sent the notes for review today. If she needs to be seen immediately, you are more than welcome to call or have the provider contact [facility] [place] [facility] to express the urgency of this referral, as they will not expedite with [place].   Thanks

## 2014-01-12 NOTE — Telephone Encounter (Signed)
Can we get the status on this referral- pt and family are very stressed out about this news.  She is concerned about the diverticula found on the scan she frequently has bowel problems. 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  Please call---the MRIdefines the mass in the pancreas as one that needs surgical biopsy to decide if its a type of cancer---can I set up the referral for her??? Any particular surgeon---I'd suggest [facility]

## 2014-01-19 ENCOUNTER — Other Ambulatory Visit: Payer: Self-pay

## 2014-01-19 MED ORDER — RIVAROXABAN 20 MG PO TABS: 20.0000 mg | ORAL_TABLET | Freq: Every day | ORAL | Status: DC

## 2014-01-19 NOTE — Telephone Encounter (Signed)
I have reviewed her MRI for Dr Laney Pastor since he is currently out of the office.  My comments are in red in the impression of the MRI -- (I have also sent to Dr Laney Pastor for his review)  IMPRESSION: 1. Hypoenhancing pancreatic tail mass without findings of metastatic disease to the liver, or retroperitoneum. The only potential vascular encasement/involvement is and the splenic vessels which skirt the upper margin of the mass. The mass itself nearly abuts the posterior inferior hilum of the spleen. The pancreatic mass has typical imaging findings of pancreatic adenocarcinoma, and biopsy along with surgical referral in anticipation of possible potential distal pancreatectomy is likely indicated. -- this is what the biopsy is for that we are doing a referral. 2. 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 -- this is why the filter was placed to catch any clot from going to the lungs - she is also on the Xarelto to help with these clots 3. Various ancillary findings include several benign appearing hepatic and left renal cysts as follows scattered colonic Diverticula. -- the cysts are most likely nothing to be concerned about and they are commonly found on MRI, the diverticular means that she has diverticulosis but does not typically cause chronic problems but if she get acute pain and fever she could have diverticulitis -- her urinary symptoms should not be related to this as the daughter was concerned about -

## 2014-01-19 NOTE — Telephone Encounter (Signed)
Pt has an appt on 02/08/14 at 58 with dr byers at [facility]

## 2014-01-19 NOTE — Telephone Encounter (Signed)
Pt's daughter called to ask for a RF of Xarelto. She was started on it at the hospital, but needs for [place] to Rx it now. Dr Tamala Julian discussed at last Agency. Pt has taken her last tablet yesterday and should have had one last night. She stated that the pharm said they had req'd RF but nothing has been req'd electronically.

## 2014-01-19 NOTE — Telephone Encounter (Signed)
Called pt and explained to her and daughter Lucita Ferrara comments on MRI. I advised that when Dr Laney Pastor sees this message, if he has any different of add'l info for pt we will call her back. Dr Laney Pastor, do we need to tell pt anything else about results?

## 2014-01-19 NOTE — Telephone Encounter (Signed)
Notified pt Rx was sent.

## 2014-01-19 NOTE — Telephone Encounter (Signed)
Dr Laney Pastor, pt's daughter, who was there with pt in the background, called asking for more explanation from you regarding the MRI Abd results. 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 3. Various ancillary findings include several benign appearing hepatic and left renal cysts as follows scattered colonic diverticula.  In addition to your comments on these findings, Pt/daughter asked if pt's worsening urinary incontinence could be attributed to either of these findings. Please address these concerns as they are very anxious about this and have been expecting a CB about them, and haven't heard anything beyond the pancreas finding.

## 2014-01-19 NOTE — Telephone Encounter (Signed)
Sent to the pharmacy

## 2014-01-29 DIAGNOSIS — C259 Malignant neoplasm of pancreas, unspecified: Secondary | ICD-10-CM

## 2014-01-29 HISTORY — DX: Malignant neoplasm of pancreas, unspecified: C25.9

## 2014-02-08 ENCOUNTER — Telehealth: Payer: Self-pay | Admitting: Physical Therapy

## 2014-02-08 ENCOUNTER — Other Ambulatory Visit: Payer: Self-pay | Admitting: General Surgery

## 2014-02-08 DIAGNOSIS — C259 Malignant neoplasm of pancreas, unspecified: Secondary | ICD-10-CM

## 2014-02-08 NOTE — Telephone Encounter (Signed)
Gave pt appt for Lympedema clinic, pt is aware of appt for tomorrow 02/09/14

## 2014-02-09 ENCOUNTER — Ambulatory Visit
Admission: RE | Admit: 2014-02-09 | Discharge: 2014-02-09 | Disposition: A | Discharge status: Home / Self Care | Payer: Medicare Other | Source: Ambulatory Visit | Attending: General Surgery | Admitting: General Surgery

## 2014-02-09 ENCOUNTER — Ambulatory Visit: Payer: Medicare Other | Attending: General Surgery | Admitting: Physical Therapy

## 2014-02-09 DIAGNOSIS — R5381 Other malaise: Secondary | ICD-10-CM | POA: Insufficient documentation

## 2014-02-09 DIAGNOSIS — R29898 Other symptoms and signs involving the musculoskeletal system: Secondary | ICD-10-CM | POA: Diagnosis not present

## 2014-02-09 DIAGNOSIS — C259 Malignant neoplasm of pancreas, unspecified: Secondary | ICD-10-CM

## 2014-02-09 MED ORDER — IOHEXOL 300 MG/ML  SOLN
75.0000 mL | Freq: Once | INTRAMUSCULAR | Status: AC | PRN
  Administered 2014-02-09: 75 mL via INTRAVENOUS

## 2014-02-09 NOTE — Therapy (Signed)
[place], [place], [address] Phone: [phone]   Fax:  [phone]  Physical Therapy Evaluation  Patient Details  Name: [name] MRN: [id number] Date of Birth: [birth year] Referring Provider:  [name], MD  Encounter Date: 02/09/2014      PT End of Session - 02/09/14 1722    Visit Number 1   Number of Visits 7   Date for PT Re-Evaluation 03/09/14   PT Start Time 8850   PT Stop Time 1620   PT Time Calculation (min) 57 min      Past Medical History  Diagnosis Date  . Arthritis   . Glaucoma   . Clotting disorder   . DVT (deep venous thrombosis)     LEFT  . GERD (gastroesophageal reflux disease)   . Hypercholesterolemia   . Peripheral neuropathy   . Essential tremor     Past Surgical History  Procedure Laterality Date  . Cholecystectomy    . Spine surgery    . Abdominal hysterectomy    . Breast lumpectomy Right   . Knee surgery Bilateral     There were no vitals taken for this visit.  Visit Diagnosis:  Physical deconditioning - Plan: PT plan of care cert/re-cert  Weakness of both legs - Plan: PT plan of care cert/re-cert      Subjective Assessment - 02/09/14 1533    Symptoms Pt. is unsure why she has been referred to physical therapy.   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    Has neuropathy in her feet, but from unknown cause.  Had used a cane, but after a leg DVT, has used a walker.  DVT was 12/18/13.    h/o DVT previously with PE; has a filter.  h/o mid-low back surgery; had a bulging disk and some stenosis; surgery was interrupted due to bleeding; that was 3 years ago.  h/o knee surgeries bilat.; arthroscopic debridements?  Does no regular exercise.   Patient Stated Goals She doesn't have any.   Currently in Pain? Yes   Pain Score 6    Pain Location Leg   Pain Orientation Left   Pain Descriptors / Indicators Dull   Aggravating Factors  being up on her feet   Pain Relieving Factors get off her feet          [facility] PT Assessment - 02/09/14 0001    Assessment   Medical Diagnosis Pancreatic tumor   Precautions   Precautions Other (comment)   Precaution Comments cancer precautions   Restrictions   Weight Bearing Restrictions No   Balance Screen   Has the patient fallen in the past 6 months Yes   How many times? 1-2 times, maybe, or tripped   Has the patient had a decrease in activity level because of a fear of falling?  Yes   Is the patient reluctant to leave their home because of a fear of falling?  Yes   [place] Private residence   Living Arrangements Children   Type of [place] to enter   Entrance Stairs-Number of Steps 1   [place] One level   Observation/Other Assessments   Observations swollen ankles bilat.   Sensation  Light Touch Impaired by gross assessment  not tested, but by report, sounds fairly limited   Additional Comments Has to have feet off the bed because they're otherwise too uncomfortable to sleep; also gets feet and leg cramps   Functional Tests   Functional tests Sit to Stand   Sit to Stand   Comments needs assist sit to and from stand  says she gets off her bed indep., and up from one chair   AROM   Overall AROM  Other (comment)   Overall AROM Comments Both shoulders WFL   Strength   Overall Strength Deficits   Overall Strength Comments hip flexors 3-/5 bilat.; quads 4+/5 with right knee pain; ankle dorsiflexors on right 4/5 but limited by pain;  hamstrings on right show good resistance in sitting; left limited by pain and weaker   Bed Mobility   Bed Mobility Not assessed  says she holds side of bed to turn   Ambulation/Gait    Ambulation/Gait Yes   Ambulation/Gait Assistance 6: Modified independent (Device/Increase time)   Ambulation Distance (Feet) --  limited; doesn't grocery shop alone; uses wheelchair cart   Assistive device Rolling walker  with seat; at home, doesn't use walker, but uses walls    Gait Pattern Step-through pattern  with rolling walker   Ambulation Surface Level  can do one step; probably not more, per her report   Gait velocity slow   Gait Comments Has used the walker since the last DVT in November                             [place] - Mar 11, 2014 1731    CC Short Term Goal  #1   Title Pt. will be independent in an initial home exercise program.   Time 2   Period Weeks   Status New             Long Term Clinic Goals - 11-Mar-2014 1731    CC Long Term Goal  #1   Title Long term goals to be determined, as patient was hesitant to schedule therapy, and only scheduled one appointment so far.            Plan - 03/11/14 1725    Clinical Impression Statement Patient was very hesitant about starting physical therapy, reporting that she felt overwhelmed with all that is going on.  She said she wanted to wait until after surgery to do therapy, but ehn after a few minutes, she came back and said she wanted to schedule one therapy session.   Pt will benefit from skilled therapeutic intervention in order to improve on the following deficits Abnormal gait;Decreased endurance;Impaired sensation;Decreased strength   Rehab Potential Fair   Clinical Impairments Affecting Rehab Potential Patient with recent left thigh DVT that is still painful and limits activity, so probably will need to limit LE strengthening and endurance exercise for now.   PT Frequency 1x / week   PT Duration 4 weeks   PT Treatment/Interventions Therapeutic exercise;Patient/family education;ADLs/Self Care Home Management;Neuromuscular re-education;Gait training   PT Next Visit Plan Try arm  bike for endurance; start with UE strengthening; functional strengthening such as sit to and from stand AS TOLERATED due to pain from left thigh DVT   Consulted and Agree with Plan of Care Patient;Family member/caregiver          G-Codes - 11-Mar-2014 1732    Functional Assessment Tool Used clinical judgement  Functional Limitation Mobility: Walking and moving around   Mobility: Walking and Moving Around Current Status [phone]) At least 60 percent but less than 80 percent impaired, limited or restricted   Mobility: Walking and Moving Around Goal Status [phone]) At least 40 percent but less than 60 percent impaired, limited or restricted       Problem List Patient Active Problem List   Diagnosis Date Noted  . DVT (deep venous thrombosis) 01/11/2014  . Pancreatic mass on CT 11/15 12/25/2013  . Diverticular disease 12/22/2013  . History of DVT (deep vein thrombosis) 03/01/2011  . Hx pulmonary embolism 03/01/2011  . HTN (hypertension) 03/01/2011  . Hypercholesterolemia 03/01/2011  . Reflux 03/01/2011    SALISBURY,DONNA 02/09/2014, 5:35 PM  [place], [place], [address] Phone: [phone]   Fax:  [phone]   Serafina Royals, [place]

## 2014-02-09 NOTE — Progress Notes (Signed)
Quick Note:  Please let patient know her CT chest did not show any evidence of PE or cancer ______

## 2014-02-10 ENCOUNTER — Other Ambulatory Visit: Payer: Self-pay | Admitting: Gastroenterology

## 2014-02-12 ENCOUNTER — Telehealth: Payer: Self-pay | Admitting: Hematology

## 2014-02-12 NOTE — Telephone Encounter (Signed)
S/W PATIENT AND GAVE NP APPT FOR 01/19 @ 12 W/DR. FENG, [place] 01/28 @ 12:30

## 2014-02-16 ENCOUNTER — Encounter (HOSPITAL_COMMUNITY): Payer: Self-pay | Admitting: *Deleted

## 2014-02-16 ENCOUNTER — Ambulatory Visit: Payer: Medicare Other

## 2014-02-16 ENCOUNTER — Telehealth: Payer: Self-pay | Admitting: Hematology

## 2014-02-16 ENCOUNTER — Encounter: Payer: Self-pay | Admitting: Hematology

## 2014-02-16 ENCOUNTER — Ambulatory Visit (HOSPITAL_BASED_OUTPATIENT_CLINIC_OR_DEPARTMENT_OTHER): Payer: Medicare Other

## 2014-02-16 ENCOUNTER — Ambulatory Visit (HOSPITAL_BASED_OUTPATIENT_CLINIC_OR_DEPARTMENT_OTHER): Payer: Medicare Other | Admitting: Hematology

## 2014-02-16 VITALS — BP 149/75 | HR 75 | Temp 98.2°F | Resp 18 | Ht 69.5 in | Wt 216.8 lb

## 2014-02-16 DIAGNOSIS — I82402 Acute embolism and thrombosis of unspecified deep veins of left lower extremity: Secondary | ICD-10-CM

## 2014-02-16 DIAGNOSIS — K869 Disease of pancreas, unspecified: Secondary | ICD-10-CM

## 2014-02-16 DIAGNOSIS — K8689 Other specified diseases of pancreas: Secondary | ICD-10-CM

## 2014-02-16 LAB — COMPREHENSIVE METABOLIC PANEL (CC13)
ALK PHOS: 96 U/L (ref 40–150)
ALT: 10 U/L (ref 0–55)
AST: 24 U/L (ref 5–34)
Albumin: 3.6 g/dL (ref 3.5–5.0)
Anion Gap: 9 mEq/L (ref 3–11)
BILIRUBIN TOTAL: 0.42 mg/dL (ref 0.20–1.20)
BUN: 7.7 mg/dL (ref 7.0–26.0)
CALCIUM: 9.3 mg/dL (ref 8.4–10.4)
CHLORIDE: 104 meq/L (ref 98–109)
CO2: 27 mEq/L (ref 22–29)
CREATININE: 0.7 mg/dL (ref 0.6–1.1)
GLUCOSE: 97 mg/dL (ref 70–140)
Potassium: 3.8 mEq/L (ref 3.5–5.1)
SODIUM: 140 meq/L (ref 136–145)
Total Protein: 7.2 g/dL (ref 6.4–8.3)

## 2014-02-16 LAB — CBC & DIFF AND RETIC
BASO%: 0.4 % (ref 0.0–2.0)
Basophils Absolute: 0 10*3/uL (ref 0.0–0.1)
EOS%: 1.7 % (ref 0.0–7.0)
Eosinophils Absolute: 0.1 10*3/uL (ref 0.0–0.5)
HCT: 41.7 % (ref 34.8–46.6)
HGB: 13.7 g/dL (ref 11.6–15.9)
Immature Retic Fract: 4.6 % (ref 1.60–10.00)
LYMPH%: 35.6 % (ref 14.0–49.7)
MCH: 31 pg (ref 25.1–34.0)
MCHC: 32.9 g/dL (ref 31.5–36.0)
MCV: 94.3 fL (ref 79.5–101.0)
MONO#: 0.4 10*3/uL (ref 0.1–0.9)
MONO%: 8.2 % (ref 0.0–14.0)
NEUT#: 2.9 10*3/uL (ref 1.5–6.5)
NEUT%: 54.1 % (ref 38.4–76.8)
Platelets: 177 10*3/uL (ref 145–400)
RBC: 4.42 10*6/uL (ref 3.70–5.45)
RDW: 13.1 % (ref 11.2–14.5)
Retic %: 2.25 % — ABNORMAL HIGH (ref 0.70–2.10)
Retic Ct Abs: 99.45 10*3/uL — ABNORMAL HIGH (ref 33.70–90.70)
WBC: 5.4 10*3/uL (ref 3.9–10.3)
lymph#: 1.9 10*3/uL (ref 0.9–3.3)

## 2014-02-16 MED ORDER — ENOXAPARIN SODIUM 100 MG/ML ~~LOC~~ SOLN: 100.0000 mg | Freq: Two times a day (BID) | SUBCUTANEOUS | Status: DC

## 2014-02-16 NOTE — Progress Notes (Signed)
Checked in new pt with no financial concerns prior to seeing the dr.  Pt has 2 insurances so financial assistance may not be needed but she has Raquel's card for any billing or insurance questions or concerns. ° °

## 2014-02-16 NOTE — Progress Notes (Signed)
[place]  [phone] Fax:([phone]) [place] Note   Patient Care Team: Leandrew Koyanagi, MD as PCP - General (Internal Medicine) 02/16/2014  CHIEF COMPLAINTS/PURPOSE OF CONSULTATION:  Recurrent DVT and pancreatic mass  HISTORY OF PRESENTING ILLNESS:  [name] 69 y.o. 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 She had IVC filter placed back then and put on coumadin. She had second episode of right leg DVT when she was on coumadin in 2009, and continued coumadin until one year ago when she had trouble for monitoring her INR. She was then switched to Aspirin 81mg  dialy. 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 CT abdomen was ordered to further evaluate the DVT and abdominal pain on 12/22/2013, which showed thrombus in IVC and right common iliac vein below IVC. CT scan also showed a 2.8 x 2.5 cm mass within the tail of pancreas, this was confirmed by the MRI abdomen, with probably encasement of the splenic vessels. No significant abdominal adenopathy. CT of the chest was negative for distant metastasis. She was referred to see GI Dr.Hung and will have EGD/EUS for pancreatic mass biopsy in a week.   She has been having abdominal pain after eating in the pat few month, along with diarrhea and constipation, which has been chronic since her cholecystectomy in 199. Her appetite is fare, and weight is stable. She also has tremor since teenager, and got worse lately, along with gait instaedyness, was seen by neurologist Dr. Carles Collet. She was put on primidone which has help quite bit.   She had both knee surgery and was using  a crane until recently when she was diagnosed with DVT and she has been using a walker. She has been having some gait instability lately, she was referred by Dr. Carles Collet and MRI of brain showed mild chronic small vessel ischemia disease and cerebral atrophy.  MEDICAL HISTORY:  Past Medical History  Diagnosis Date  . Arthritis   . Glaucoma   . Clotting disorder   . DVT (deep venous thrombosis)     LEFT  . GERD (gastroesophageal reflux disease)   . Hypercholesterolemia   . Peripheral neuropathy   . Essential tremor     SURGICAL HISTORY: Past Surgical History  Procedure Laterality Date  . Cholecystectomy    . Spine surgery    . Abdominal hysterectomy    . Breast lumpectomy Right   . Knee surgery Bilateral     SOCIAL HISTORY: History   Social History  . Marital Status: Divorced    Spouse Name: N/A    Number of Children: 3  . Years of Education: N/A   Occupational History   She used to work for [employer], retired now    Social History Main Topics  . Smoking status: Current Every Day Smoker -- 0.50 packs/day, for 40 years     Types: Cigarettes  . Smokeless tobacco: Not on file  . Alcohol Use: 0.0 oz/week, social drinker     0 Not specified per week     Comment: occ- glass of wine a week   . Drug Use: No  .  Sexual Activity: Not on file   Other Topics Concern  . Not on file   Social History Narrative   Divorced, 3 children   Right handed   Associates degree   3 per week    FAMILY HISTORY: Family History  Problem Relation Age of Onset  . Diabetes Father 73  . Diabetes Maternal Grandmother   . Hypertension Mother   . Glaucoma Sister   . Cataracts Maternal Grandmother   . Cancer Sister 52    Breast    ALLERGIES:  is allergic to sulfa antibiotics.  MEDICATIONS:  Current Outpatient Prescriptions  Medication Sig Dispense Refill  . acetaminophen (TYLENOL) 500 MG tablet Take 500 mg by mouth every 6 (six) hours as needed (Pain).    Marland Kitchen atorvastatin  (LIPITOR) 40 MG tablet Take 1 tablet (40 mg total) by mouth daily. 90 tablet 1  . diltiazem (CARTIA XT) 120 MG 24 hr capsule Take 1 capsule (120 mg total) by mouth daily. 90 capsule 1  . gabapentin (NEURONTIN) 300 MG capsule Take 1 capsule (300 mg total) by mouth at bedtime. After 7 days may increase to 2 at bedtime (Patient taking differently: Take 600 mg by mouth at bedtime. ) 60 capsule 2  . HYDROcodone-acetaminophen (NORCO/VICODIN) 5-325 MG per tablet Take 1 tablet by mouth every 6 (six) hours as needed for moderate pain. 10 tablet 0  . lipase/protease/amylase (CREON) 36000 UNITS CPEP capsule Take 36,000 Units by mouth 2 (two) times daily.    Marland Kitchen loratadine (CLARITIN) 10 MG tablet Take 1 tablet (10 mg total) by mouth daily. (Patient not taking: Reported on 02/09/2014) 14 tablet 0  . pantoprazole (PROTONIX) 40 MG tablet TAKE 1 TABLET BY MOUTH DAILY 30 tablet 3  . predniSONE (DELTASONE) 20 MG tablet Two tablets daily x 5 days then one tablet daily x 5 days (Patient not taking: Reported on 02/09/2014) 15 tablet 0  . primidone (MYSOLINE) 50 MG tablet Take 1 tablet (50 mg total) by mouth at bedtime. 90 tablet 1  . propranolol ER (INDERAL LA) 120 MG 24 hr capsule Take 1 capsule (120 mg total) by mouth daily. 30 capsule 2  . rivaroxaban (XARELTO) 20 MG TABS tablet Take 1 tablet (20 mg total) by mouth daily with supper. 30 tablet 5   No current facility-administered medications for this visit.    REVIEW OF SYSTEMS:   Constitutional: Denies fevers, chills or abnormal night sweats Eyes: Denies blurriness of vision, double vision or watery eyes Ears, nose, mouth, throat, and face: Denies mucositis or sore throat Respiratory: Denies cough, dyspnea or wheezes Cardiovascular: Denies palpitation, chest discomfort or lower extremity swelling Gastrointestinal:  Denies nausea, heartburn or change in bowel habits. (+) intermittent abdominal pain  Skin: Denies abnormal skin rashes Lymphatics: Denies new  lymphadenopathy or easy bruising Neurological:Denies numbness, tingling or new weaknesses Behavioral/Psych: Mood is stable, no new changes  Extremities: Bilateral lower extremity edema and leg pain, left more than right. All other systems were reviewed with the patient and are negative.  PHYSICAL EXAMINATION: ECOG PERFORMANCE STATUS: 3 - Symptomatic, >50% confined to bed  Filed Vitals:   02/16/14 1229  BP: 149/75  Pulse: 75  Temp: 98.2 F (36.8 C)  Resp: 18   Filed Weights   02/16/14 1229  Weight: 216 lb 12.8 oz (98.34 kg)    GENERAL:alert, no distress and comfortable SKIN: skin color, texture, turgor are normal, no rashes or significant lesions EYES: normal, conjunctiva are pink and non-injected, sclera clear OROPHARYNX:no exudate, no erythema  and lips, buccal mucosa, and tongue normal  NECK: supple, thyroid normal size, non-tender, without nodularity LYMPH:  no palpable lymphadenopathy in the cervical, axillary or inguinal LUNGS: clear to auscultation and percussion with normal breathing effort HEART: regular rate & rhythm and no murmurs and no lower extremity edema ABDOMEN:abdomen soft, non-tender and normal bowel sounds Musculoskeletal:no cyanosis of digits and no clubbing  PSYCH: alert & oriented x 3 with fluent speech NEURO: no focal motor/sensory deficits  LABORATORY DATA:  I have reviewed the data as listed Lab Results  Component Value Date   WBC 7.6 12/18/2013   HGB 14.3 12/18/2013   HCT 43.0 12/18/2013   MCV 96.0 12/18/2013   PLT 177 12/18/2013    Recent Labs  05/03/13 1306 05/12/13 1453 12/18/13 1306 01/04/14 1107  NA 142  --  135* 136  K 3.7  --  4.2 4.1  CL 104  --  98 104  CO2 26  --  22 26  GLUCOSE 92  --  143* 88  BUN 10  --  8 11  CREATININE 0.66  --  0.89 0.99  CALCIUM 8.7  --  9.6 9.0  GFRNONAA  --   --  66*  --   GFRAA  --   --  77*  --   PROT 7.2 6.9  --  6.8  ALBUMIN 3.8  --   --  3.6  AST 26  --   --  29  ALT 8  --   --  10    ALKPHOS 109  --   --  80  BILITOT 0.6  --   --  0.5    RADIOGRAPHIC STUDIES: I have personally reviewed the radiological images as listed and agreed with the findings in the report. Ct Chest W Contrast  02/09/2014   CLINICAL DATA:  Pancreatic adenocarcinoma, treatment planning. Staging.  EXAM: CT CHEST WITH CONTRAST  TECHNIQUE: Multidetector CT imaging of the chest was performed during intravenous contrast administration.  CONTRAST:  60mL OMNIPAQUE IOHEXOL 300 MG/ML  SOLN  COMPARISON:  12/22/2013  FINDINGS: Mediastinum/Nodes: Left axillary lymph node of 1 cm in short axis on image 20 of series 2, within normal limits. No pathologic thoracic adenopathy observed. Minimal atherosclerotic calcification of the aortic arch.  Lungs/Pleura: The lungs appear clear.  Upper abdomen: Pancreatic tail mass, hypodense right hepatic lobe lesion, IVC filter, left renal hypodense lesion which is probably a cyst, and colonic diverticulosis observed, similar to prior diagnostic CT of the abdomen.  Musculoskeletal: Mild mid thoracic spondylosis.  IMPRESSION: 1. No findings of metastatic disease in the chest.   Electronically Signed   By: Sherryl Barters M.D.   On: 02/09/2014 17:14   MR abdomen w wo contrast 01/05/2014 IMPRESSION: 1. Hypoenhancing pancreatic tail mass without findings of metastatic disease to the liver, or retroperitoneum. The only potential vascular encasement/involvement is and the splenic vessels which skirt the upper margin of the mass. The mass itself nearly abuts the posterior inferior hilum of the spleen. The pancreatic mass has typical imaging findings of pancreatic adenocarcinoma, and biopsy along with surgical referral in anticipation of possible potential distal pancreatectomy is likely indicated. 2. 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 3. Various ancillary findings include several benign appearing hepatic and left renal cysts as  follows scattered colonic diverticula.  CT abd/pel w wo contrast 12/22/2013 IMPRESSION: 1. No acute findings within the abdomen or pelvis to explain patient's  abdominal pain. 2. There is a mass within the tail of pancreas which is concerning for adenocarcinoma. Further evaluation with contrast enhanced pancreas protocol MRI is recommended along with tissue sampling. No evidence for metastatic disease. 3. Findings compatible with lower extremity DVT involving the left Leg.  Lower extremity venous duplex 12/18/2013  - Findings consistent with chronic deep vein thrombosis involving the popliteal vein in the right lower extremity. - No evidence of superficial thrombosis involving the right lower extremity. - Findings consistent with acute deep vein thrombosis involving the posterior tibial vein coursing through the popliteal, femoral, and common femoral veins in the left lower extremity. - There is evidence of a superficial thrombosis of the left proximal greater saphenous vein coursing into the and including the saphenofemoral junction.  ASSESSMENT & PLAN:  69 year old Caucasian female, with past medical history of recurrent bilateral lower extremity DVTs, presented to women's acute extensive left lower extremity DVT and thrombus in the IVC and the right, iliac vein, and chronic DVT in the right popliteal vein. She was also found to have a pancreatic tail mass on the CT scan.  1. Pancreatic tail mass -Her imaging findings is suspicious for pancreatic adenocarcinoma, also neuroendocrine tumor is also a possibility. She is scheduled to have a EGD/EUS and about pain critical mass biopsy by Dr. Benson Norway in a week. -The mass appears to be resectable. She has been evaluated by surgeon Dr. Barry Dienes and would likely to have upfront surgery. -If the biopsy and surgical past shows adenocarcinoma, I may consider adjuvant chemotherapy with single agent gemcitabine. However she does have  multiple comorbidities, poor performance status. I'll reevaluate her candidacy for adjuvant chemotherapy after her surgery. -Due to her insurance coverage issue, I'll check her tumor marker CA 19-9 if her tissue biopsy confirms adenocarcinoma.  2. Recurrent DVT  -She never had thrombophilia workup before. She is already on Xarelto, I'll check prothrombin gene mutation, factor V Leyden mutation, and antithrombin III to see if she has any genetic disorders to cause hypercoagulopathy. -Giving her recurrent unprovoked DVT, I recommend anticoagulation indefinitely. -I discussed different options of anti-coagulation. She has persistent leg pain and swollen since she started Xarelto 5-6 weeks ago, I recommend her to switch to Lovenox 100 mg every 12 hours. I'll repeat ultrasound and CT scan in 3 months, if her acute DVT results, I'll switch her to Lovenox once daily or Xarelto. -We also discussed other management options to improve her leg swelling and revent further thrombosis, such as compression stocks,  avoid smoking or hormonal replacement, and be physically active.   3. She will continue to follow-up with her primary care physician and neurologist Dr. Carles Collet for her tremor, peripheral neuropathy and gait disturbance.   Plan: -Lab: CBC and CMP today  -Switch you from [place] to lovenox -Return to clinic in 1 months, or 2-3 weeks after your surgery.   All questions were answered. The patient knows to call the clinic with any problems, questions or concerns. I spent 55 minutes counseling the patient face to face. The total time spent in the appointment was 60 minutes and more than 50% was on counseling.     Truitt Merle, MD 02/16/2014 12:45 PM

## 2014-02-16 NOTE — Telephone Encounter (Signed)
Pt confirmed labs/ov per 01/19 POF, gave pt AVS.... KJ sent pt back to labs

## 2014-02-18 ENCOUNTER — Ambulatory Visit: Payer: Medicare Other

## 2014-02-18 DIAGNOSIS — R5381 Other malaise: Secondary | ICD-10-CM | POA: Diagnosis not present

## 2014-02-18 DIAGNOSIS — R29898 Other symptoms and signs involving the musculoskeletal system: Secondary | ICD-10-CM

## 2014-02-18 NOTE — Patient Instructions (Signed)
Abduction: Scaption - Thumb Up (Dumbbell)   Lift arms out from sides, thumbs up. Raise arms in front; then make a "V", then do all the way out to the side into a "T" Repeat _5-8___ times per set. Do _2___ sets per session. Do __1-2__ sessions per day. Use _1-2___ lb weights.  Resisted Horizontal Abduction: Bilateral   Sit , red theraband in both hands, arms out in front. Keeping arms straight, pinch shoulder blades together and stretch arms out. Then do diagnol "pulling sword out" Repeat __5-8__ times per set. Do _2___ sets per session. Do __1-2__ sessions per day.  http://orth.exer.us/969   Copyright  VHI. All rights reserved.

## 2014-02-18 NOTE — Therapy (Signed)
[place], [place], [address] Phone: [phone]   Fax:  [phone]  Physical Therapy Treatment  Patient Details  Name: [name] MRN: [id number] Date of Birth: [birth year] Referring Provider:  Leandrew Koyanagi, MD  Encounter Date: 02/18/2014      PT End of Session - 02/18/14 1433    Visit Number 2   Number of Visits 7   Date for PT Re-Evaluation 03/09/14   PT Start Time 6301   PT Stop Time 1434   PT Time Calculation (min) 42 min      Past Medical History  Diagnosis Date  . Arthritis   . Glaucoma   . Clotting disorder   . DVT (deep venous thrombosis)     LEFT  . GERD (gastroesophageal reflux disease)   . Hypercholesterolemia   . Peripheral neuropathy   . Essential tremor     Past Surgical History  Procedure Laterality Date  . Cholecystectomy    . Spine surgery    . Abdominal hysterectomy    . Breast lumpectomy Right   . Knee surgery Bilateral   . Greenfield filter    . Back surgery      There were no vitals taken for this visit.  Visit Diagnosis:  Physical deconditioning  Weakness of both legs      Subjective Assessment - 02/18/14 1358    Symptoms My Lt thigh is just a little sore today and my stomach always feels full until I have a BM and then I get a little relief.                     [facility] Adult PT Treatment/Exercise - 02/18/14 0001    Knee/Hip Exercises: Aerobic   Stationary Bike Arm Bike Level 1 x4 mins (2 mins each direction)   Knee/Hip Exercises: Standing   Heel Raises --   Other Standing Knee Exercises Sit to stand 2 sets of 5 reps with pts walker, started having Lt thigh pain so stopped.   Shoulder Exercises: Seated   Horizontal ABduction 5 reps;Strengthening;AROM;Both  2 sets of 5 reps   Theraband Level (Shoulder Horizontal ABduction) Level 2 (Red)   Flexion AROM;Strengthening;Both;5 reps;Weights  2 sets to 90 degrees   Flexion Weight  (lbs) 1   Abduction AROM;Strengthening;Both;5 reps;Weights  2 sets to 90 degrees   ABduction Weight (lbs) 1   Other Seated Exercises Bil D2 2 sets of 5 reps with Red Theraband   Other Seated Exercises Scaption bil UE's to 90 degrees 2 sets of 5 reps 1lb   Shoulder Exercises: ROM/Strengthening   Other ROM/Strengthening Exercises Seated overhead alternating shoulder presses with 1lb, then alternating bicep curls 2lbs 2 sets of 5 reps each                PT Education - 02/18/14 1433    Education provided Yes   Education Details UE strengthening   Person(s) Educated Patient;Child(ren)   Methods Explanation;Demonstration;Handout   Comprehension Verbalized understanding;Returned demonstration;Verbal cues required           Short Term Clinic Goals - 02/09/14 1731    CC Short Term Goal  #1   Title Pt. will be independent in an initial home exercise program.   Time 2   Period Weeks   Status New             Long Term Clinic Goals - 02/09/14 1731    CC Long Term Goal  #  1   Title Long term goals to be determined, as patient was hesitant to schedule therapy, and only scheduled one appointment so far.            Plan - 02/18/14 1434    Clinical Impression Statement Though fatigued, pt tolerated exercises well and able to return correct demo throughout. Daughter present for treatment.   Pt will benefit from skilled therapeutic intervention in order to improve on the following deficits Abnormal gait;Decreased endurance;Impaired sensation;Decreased strength   Rehab Potential Fair   Clinical Impairments Affecting Rehab Potential Patient with recent left thigh DVT that is still painful and limits activity, so probably will need to limit LE strengthening and endurance exercise for now.   PT Frequency 1x / week   PT Duration 4 weeks   PT Treatment/Interventions Therapeutic exercise;Patient/family education;ADLs/Self Care Home Management;Neuromuscular re-education;Gait  training   PT Next Visit Plan TFunctional strengthening such as sit to and from stand AS TOLERATED due to pain from left thigh DVT. Pt will come for another visit, wants LE HEP next.   Consulted and Agree with Plan of Care Patient;Family member/caregiver        Problem List Patient Active Problem List   Diagnosis Date Noted  . DVT (deep venous thrombosis) 01/11/2014  . Pancreatic mass on CT 11/15 12/25/2013  . Diverticular disease 12/22/2013  . History of DVT (deep vein thrombosis) 03/01/2011  . Hx pulmonary embolism 03/01/2011  . HTN (hypertension) 03/01/2011  . Hypercholesterolemia 03/01/2011  . Reflux 03/01/2011    Collie Siad Ann,PTA 02/18/2014, 2:40 PM  [place], [place], [address] Phone: [phone]   Fax:  [phone]

## 2014-02-19 LAB — PROTHROMBIN GENE MUTATION

## 2014-02-19 LAB — ANTITHROMBIN III: AntiThromb III Func: 104 % (ref 76–126)

## 2014-02-19 LAB — FACTOR 5 LEIDEN

## 2014-02-24 ENCOUNTER — Telehealth: Payer: Self-pay

## 2014-02-24 NOTE — Telephone Encounter (Signed)
PA approved through 12/26/2013-02/24/2015. Spoke with pt, advised PA was approved.

## 2014-02-24 NOTE — Telephone Encounter (Signed)
Pt states her insurance company have changed and stated we need to call them for prior authorization in order for her to get her meds, states she have been going through this for a while Please call pt at [phone] and we need to call [phone] for approval

## 2014-02-24 NOTE — Telephone Encounter (Signed)
I have PA, will start on this.

## 2014-02-25 ENCOUNTER — Ambulatory Visit (HOSPITAL_BASED_OUTPATIENT_CLINIC_OR_DEPARTMENT_OTHER): Payer: Medicare Other | Admitting: Genetic Counselor

## 2014-02-25 ENCOUNTER — Other Ambulatory Visit: Payer: Medicare Other

## 2014-02-25 DIAGNOSIS — Z315 Encounter for genetic counseling: Secondary | ICD-10-CM

## 2014-02-25 DIAGNOSIS — Z85818 Personal history of malignant neoplasm of other sites of lip, oral cavity, and pharynx: Secondary | ICD-10-CM

## 2014-02-25 DIAGNOSIS — K8689 Other specified diseases of pancreas: Secondary | ICD-10-CM

## 2014-02-25 DIAGNOSIS — Z8042 Family history of malignant neoplasm of prostate: Secondary | ICD-10-CM

## 2014-02-25 DIAGNOSIS — Z803 Family history of malignant neoplasm of breast: Secondary | ICD-10-CM

## 2014-02-25 DIAGNOSIS — K869 Disease of pancreas, unspecified: Secondary | ICD-10-CM

## 2014-02-26 ENCOUNTER — Ambulatory Visit (HOSPITAL_COMMUNITY): Payer: Medicare Other | Admitting: Certified Registered Nurse Anesthetist

## 2014-02-26 ENCOUNTER — Ambulatory Visit (HOSPITAL_COMMUNITY)
Admission: RE | Admit: 2014-02-26 | Discharge: 2014-02-26 | Disposition: A | Discharge status: Home / Self Care | Payer: Medicare Other | Source: Ambulatory Visit | Attending: Gastroenterology | Admitting: Gastroenterology

## 2014-02-26 ENCOUNTER — Encounter (HOSPITAL_COMMUNITY): Payer: Self-pay

## 2014-02-26 ENCOUNTER — Encounter (HOSPITAL_COMMUNITY)
Admission: RE | Disposition: A | Discharge status: Home / Self Care | Payer: Self-pay | Source: Ambulatory Visit | Attending: Gastroenterology

## 2014-02-26 ENCOUNTER — Encounter: Payer: Self-pay | Admitting: Genetic Counselor

## 2014-02-26 DIAGNOSIS — Z882 Allergy status to sulfonamides status: Secondary | ICD-10-CM | POA: Insufficient documentation

## 2014-02-26 DIAGNOSIS — Z86718 Personal history of other venous thrombosis and embolism: Secondary | ICD-10-CM | POA: Insufficient documentation

## 2014-02-26 DIAGNOSIS — E780 Pure hypercholesterolemia: Secondary | ICD-10-CM | POA: Diagnosis not present

## 2014-02-26 DIAGNOSIS — I10 Essential (primary) hypertension: Secondary | ICD-10-CM | POA: Diagnosis not present

## 2014-02-26 DIAGNOSIS — Z86711 Personal history of pulmonary embolism: Secondary | ICD-10-CM | POA: Insufficient documentation

## 2014-02-26 DIAGNOSIS — M1990 Unspecified osteoarthritis, unspecified site: Secondary | ICD-10-CM | POA: Insufficient documentation

## 2014-02-26 DIAGNOSIS — D688 Other specified coagulation defects: Secondary | ICD-10-CM | POA: Insufficient documentation

## 2014-02-26 DIAGNOSIS — G250 Essential tremor: Secondary | ICD-10-CM | POA: Diagnosis not present

## 2014-02-26 DIAGNOSIS — G629 Polyneuropathy, unspecified: Secondary | ICD-10-CM | POA: Diagnosis not present

## 2014-02-26 DIAGNOSIS — K219 Gastro-esophageal reflux disease without esophagitis: Secondary | ICD-10-CM | POA: Diagnosis not present

## 2014-02-26 DIAGNOSIS — C259 Malignant neoplasm of pancreas, unspecified: Secondary | ICD-10-CM | POA: Insufficient documentation

## 2014-02-26 DIAGNOSIS — F17210 Nicotine dependence, cigarettes, uncomplicated: Secondary | ICD-10-CM | POA: Diagnosis not present

## 2014-02-26 DIAGNOSIS — H409 Unspecified glaucoma: Secondary | ICD-10-CM | POA: Insufficient documentation

## 2014-02-26 HISTORY — PX: FINE NEEDLE ASPIRATION: SHX5430

## 2014-02-26 HISTORY — PX: EUS: SHX5427

## 2014-02-26 SURGERY — UPPER ENDOSCOPIC ULTRASOUND (EUS) LINEAR
Anesthesia: Monitor Anesthesia Care

## 2014-02-26 MED ORDER — PROPOFOL 10 MG/ML IV BOLUS
INTRAVENOUS | Status: AC
  Filled 2014-02-26: qty 20

## 2014-02-26 MED ORDER — LACTATED RINGERS IV SOLN
INTRAVENOUS | Status: DC | PRN
  Administered 2014-02-26: 10:00:00 via INTRAVENOUS

## 2014-02-26 MED ORDER — PROPOFOL 10 MG/ML IV BOLUS
INTRAVENOUS | Status: DC | PRN
  Administered 2014-02-26: 60 mg via INTRAVENOUS
  Administered 2014-02-26: 20 mg via INTRAVENOUS
  Administered 2014-02-26 (×2): 40 mg via INTRAVENOUS
  Administered 2014-02-26: 60 mg via INTRAVENOUS
  Administered 2014-02-26 (×2): 40 mg via INTRAVENOUS

## 2014-02-26 MED ORDER — SODIUM CHLORIDE 0.9 % IV SOLN: INTRAVENOUS | Status: DC

## 2014-02-26 MED ORDER — LIDOCAINE HCL (CARDIAC) 20 MG/ML IV SOLN
INTRAVENOUS | Status: AC
  Filled 2014-02-26: qty 5

## 2014-02-26 MED ORDER — CIPROFLOXACIN IN D5W 400 MG/200ML IV SOLN
INTRAVENOUS | Status: AC
  Filled 2014-02-26: qty 200

## 2014-02-26 MED ORDER — BUTAMBEN-TETRACAINE-BENZOCAINE 2-2-14 % EX AERO
INHALATION_SPRAY | CUTANEOUS | Status: DC | PRN
  Administered 2014-02-26: 2 via TOPICAL

## 2014-02-26 MED ORDER — LACTATED RINGERS IV SOLN
INTRAVENOUS | Status: DC
  Administered 2014-02-26: 1000 mL via INTRAVENOUS

## 2014-02-26 NOTE — Transfer of Care (Signed)
Immediate Anesthesia Transfer of Care Note  Patient: [name]  Procedure(s) Performed: Procedure(s): UPPER ENDOSCOPIC ULTRASOUND (EUS) LINEAR (N/A) FINE NEEDLE ASPIRATION (FNA) LINEAR (N/A)  Patient Location: PACU and Endoscopy Unit  Anesthesia Type:MAC  Level of Consciousness: awake, oriented, patient cooperative, lethargic and responds to stimulation  Airway & Oxygen Therapy: Patient Spontanous Breathing and Patient connected to nasal cannula oxygen  Post-op Assessment: Report given to RN, Post -op Vital signs reviewed and stable and Patient moving all extremities  Post vital signs: Reviewed and stable  Last Vitals:  Filed Vitals:   02/26/14 0905  BP: 143/77  Pulse: 71  Temp: 36.7 C  Resp: 13    Complications: No apparent anesthesia complications

## 2014-02-26 NOTE — Progress Notes (Signed)
[facility] Patient Visit  REFERRING PROVIDER: Leandrew Koyanagi, MD [place], [place] [address]  PRIMARY PROVIDER:  Leandrew Koyanagi, MD  PRIMARY REASON FOR VISIT:  1. Pancreatic mass   2. Family history of malignant neoplasm of breast   3. Family history of prostate cancer     HISTORY OF PRESENT ILLNESS:   [name], a 69 y.o. female, was seen for a [facility] cancer genetics consultation at the request of Dr. Laney Pastor due to a personal history of a pancreatic mass suspicious for adenocarcinoma and a family history of breast and prostate cancer.  [name] presents to clinic today to discuss the possibility of a hereditary predisposition to cancer, genetic testing, and to further clarify her future cancer risks, as well as potential cancer risks for family members. She was accompanied to clinic by her daughter, [name].  CANCER HISTORY:  [name] has no personal history of cancer, however, was recently found to have a pancreatic mass suspicious for adenocarcinoma. She is undergoing a biopsy of this mass on 02/26/2014.   Past Medical History  Diagnosis Date   Arthritis    Glaucoma    Clotting disorder    DVT (deep venous thrombosis)     LEFT   GERD (gastroesophageal reflux disease)    Hypercholesterolemia    Peripheral neuropathy    Essential tremor    Diverticulosis     Past Surgical History  Procedure Laterality Date   Cholecystectomy     Spine surgery     Abdominal hysterectomy     Breast lumpectomy Right    Knee surgery Bilateral    Greenfield filter     Back surgery      History   Social History   Marital Status: Divorced    Spouse Name: N/A    Number of Children: N/A   Years of Education: N/A   Social History Main Topics   Smoking status: Current Every Day Smoker -- 0.50 packs/day    Types: Cigarettes   Smokeless tobacco: Not on file   Alcohol Use: 0.0 oz/week    0 Not  specified per week     Comment: occ- glass of wine a week    Drug Use: No   Sexual Activity: Not on file   Other Topics Concern   Not on file   Social History Narrative   Divorced, 3 children   Right handed   Associates degree   3 per week     FAMILY HISTORY:  During the visit, a 4-generation pedigree was obtained. A copy of the pedigree with be scanned into Epic under the Media tab. Significant family history diagnoses include the following: Family History  Problem Relation Age of Onset   Diabetes Father    Cancer Father 59    prostate   Diabetes Maternal Grandmother    Cataracts Maternal Grandmother    Hypertension Mother    Glaucoma Sister    Cancer Sister 76    Breast   Cancer Paternal Uncle     throat +cig   [name]'s ancestry is of African American descent. There is no known Jewish ancestry or consanguinity.  GENETIC COUNSELING ASSESSMENT:  [name] is a 69 y.o. female with a personal history of possible pancreatic cancer (TBD with a biopsy on 02/26/2014) and a family history of breast and prostate cancer. Should her pancreatic mass turn out to be cancer, her personal and family history combined would  be suggestive of a hereditary predisposition to cancer. We, therefore, discussed and recommended the following at today's visit.   DISCUSSION:  We reviewed the characteristics, features and inheritance patterns of hereditary cancer syndromes. We also discussed genetic testing, including the appropriate family members to test, the process of testing, insurance coverage and turn-around-time for results. We discussed the implications of a negative, positive and/or variant of uncertain significant result.   We discussed that if [name] does, in fact, have pancreatic cancer, we would then recommend genetic testing for genes associated with an increased risk for pancreatic and breast cancer via a gene panel. We also discussed; however, that if [name] pancraetic  mass is not cancerous, we would then not recommend testing as she would then be at low risk for a hereditary cancer syndrome. Based on our assessment and discussion, we recommended [name] have blood drawn today to potentially pursue genetic testing for the PancNext gene panel at [facility], pending her biopsy results on 02/26/2014.   PLAN:  Based on our above recommendation, [name] wished to pursue the blood draw and her sample was drawn and will be sent to [facility] to be held by the laboratory until we have [name] pancreatic biopsy results. If her biopsy is cancerous, we will inform [place] at that time so that genetic testing for the PancNext gene panel can be initiated. If testing is intitiated, results should be available within approximately 6 weeks time, at which point they will be disclosed by telephone to [name], as will any additional recommendations warranted by these results. We encouraged [name] to remain in contact with cancer genetics annually so that we can continuously update the family history and inform her of any changes in cancer genetics and testing that may be of benefit for this family.   Ms.  [name] questions were answered to her satisfaction today. Our contact information was provided should additional questions or concerns arise. Thank you for the referral and allowing [place] to share in the care of your patient.   Catherine A. Fine, MS, CGC Certified Psychologist, sport and [email or web address] phone: [phone]  The patient was seen for a total of 45 minutes in face-to-face genetic counseling.  This patient was discussed with Dr. Burr Medico who agrees with the above.    ______________________________________________________________________ For Office Staff:  Number of people involved in session including genetic counselor: 3 Was an intern or student involved with case: not applicable

## 2014-02-26 NOTE — Anesthesia Postprocedure Evaluation (Signed)
  Anesthesia Post-op Note  Patient: [name]  Procedure(s) Performed: Procedure(s) (LRB): UPPER ENDOSCOPIC ULTRASOUND (EUS) LINEAR (N/A) FINE NEEDLE ASPIRATION (FNA) LINEAR (N/A)  Patient Location: PACU  Anesthesia Type: MAC  Level of Consciousness: awake and alert   Airway and Oxygen Therapy: Patient Spontanous Breathing  Post-op Pain: mild  Post-op Assessment: Post-op Vital signs reviewed, Patient's Cardiovascular Status Stable, Respiratory Function Stable, Patent Airway and No signs of Nausea or vomiting  Last Vitals:  Filed Vitals:   02/26/14 1130  BP: 164/92  Pulse: 73  Temp:   Resp: 18    Post-op Vital Signs: stable   Complications: No apparent anesthesia complications

## 2014-02-26 NOTE — Anesthesia Preprocedure Evaluation (Signed)
Anesthesia Evaluation  Patient identified by MRN, date of birth, ID band Patient awake    Reviewed: Allergy & Precautions, NPO status , Patient's Chart, lab work & pertinent test results  Airway Mallampati: II  TM Distance: >3 FB Neck ROM: Full    Dental no notable dental hx.    Pulmonary Current Smoker, PE breath sounds clear to auscultation  Pulmonary exam normal       Cardiovascular hypertension, Pt. on medications DVT Rhythm:Regular Rate:Normal     Neuro/Psych negative neurological ROS  negative psych ROS   GI/Hepatic negative GI ROS, Neg liver ROS,   Endo/Other  negative endocrine ROS  Renal/GU negative Renal ROS  negative genitourinary   Musculoskeletal negative musculoskeletal ROS (+)   Abdominal   Peds negative pediatric ROS (+)  Hematology negative hematology ROS (+)   Anesthesia Other Findings   Reproductive/Obstetrics negative OB ROS                             Anesthesia Physical Anesthesia Plan  ASA: III  Anesthesia Plan: MAC   Post-op Pain Management:    Induction:   Airway Management Planned: Nasal Cannula  Additional Equipment:   Intra-op Plan:   Post-operative Plan:   Informed Consent: I have reviewed the patients History and Physical, chart, labs and discussed the procedure including the risks, benefits and alternatives for the proposed anesthesia with the patient or authorized representative who has indicated his/her understanding and acceptance.   Dental advisory given  Plan Discussed with: CRNA  Anesthesia Plan Comments:         Anesthesia Quick Evaluation

## 2014-02-26 NOTE — Interval H&P Note (Signed)
History and Physical Interval Note:  02/26/2014 9:43 AM  [name]  has presented today for surgery, with the diagnosis of pancreatic mass  The various methods of treatment have been discussed with the patient and family. After consideration of risks, benefits and other options for treatment, the patient has consented to  Procedure(s): UPPER ENDOSCOPIC ULTRASOUND (EUS) LINEAR (N/A) FINE NEEDLE ASPIRATION (FNA) LINEAR (N/A) as a surgical intervention .  The patient's history has been reviewed, patient examined, no change in status, stable for surgery.  I have reviewed the patient's chart and labs.  Questions were answered to the patient's satisfaction.     Nimai Burbach D

## 2014-02-26 NOTE — Op Note (Signed)
[facility], [address]   UPPER ENDOSCOPIC ULTRASOUND PROCEDURE REPORT     EXAM DATE: 02/26/2014  PATIENT NAME:          [name], [name]          MR#: [id number] BIRTHDATE:       10-27-1945     VISIT #:     [phone] ATTENDING:     Carol Ada, MD     STATUS:     outpatient ASSISTANT:      Elspeth Cho, Jerline Pain, [place], and Job Founds MD: ASA CLASS:        Class III  INDICATIONS:  The patient is a 69 yr old female here for a lower endoscopic ultrasound due to pancreatic tail mass. PROCEDURE PERFORMED:     Upper EUS w/FNA  MEDICATIONS:     Monitored anesthesia care  CONSENT: The patient understands the risks and benefits of the procedure and understands that these risks include, but are not limited to: sedation, allergic reaction, infection, perforation and/or bleeding. Alternative means of evaluation and treatment include, among others: physical exam, x-rays, and/or surgical intervention. The patient elects to proceed with this endoscopic procedure.  DESCRIPTION OF PROCEDURE: During intra-op preparation period all mechanical & medical equipment was checked for proper function. Hand hygiene and appropriate measures for infection prevention was taken. After the risks, benefits and alternatives of the procedure were thoroughly explained, Informed consent was verified, confirmed and timeout was successfully executed by the treatment team. The patient was then placed in the left, lateral, decubitus position and IV sedation was administered. Throughout the procedure, the patients blood pressure, pulse and oxygen saturations were monitored continuously. Under direct visualization, the    was introduced through the mouth and advanced to the bulb of duodenum. Water was used as necessary to provide an acoustic interface. The pulse, BP, and O2 saturation were monitored and documented by the physician and nursing staff  throughout the procedure. Upon completion of the imaging, water was removed and the patient was then discharged to recovery in stable condition with the appropriate post procedure care.   FINDINGS: In the tail of the pancreas a large 2.5 x 3 cm well-circumscribed mass was indentified. The interface between the mass and spleen was very indistinct secondary to the patient's body habitus.  In fact, the pancreas was difficult to identifiy initially as it was very hazy.  The PD was normal in caliber.  The celiac axis was negative for any lymph nodes.  Five passes with the 25 gauge FNA needle were performed with good sampling.  The echoendoscope was then advanced to the duodenal bulb and the CBD was normal in caliber.  No head of the pancreas abnormalities noted.  At this time the patient started to experience bradycardia in the 30's, which was most likely vagal.  The procedure was terminated and her heart rate increased.  No adverse events were experienced..    ADVERSE EVENTS:     There were no immediate complications. IMPRESSIONS:     2.5 X 3 cm pancreatic tail mass.  RECOMMENDATIONS:     1) Await FNA results.  ___________________________________ Carol Ada, MD eSigned:  Carol Ada, MD 02/26/2014 11:35 AM   cc:

## 2014-02-26 NOTE — H&P (View-Only) (Signed)
[facility]  Telephone:([phone]) [phone] Fax:([phone]) [place] Note   Patient Care Team: Leandrew Koyanagi, MD as PCP - General (Internal Medicine) 02/16/2014  CHIEF COMPLAINTS/PURPOSE OF CONSULTATION:  Recurrent DVT and pancreatic mass  HISTORY OF PRESENTING ILLNESS:  [name] 69 y.o. 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 She had IVC filter placed back then and put on coumadin. She had second episode of right leg DVT when she was on coumadin in 2009, and continued coumadin until one year ago when she had trouble for monitoring her INR. She was then switched to Aspirin 81mg  dialy. 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 CT abdomen was ordered to further evaluate the DVT and abdominal pain on 12/22/2013, which showed thrombus in IVC and right common iliac vein below IVC. CT scan also showed a 2.8 x 2.5 cm mass within the tail of pancreas, this was confirmed by the MRI abdomen, with probably encasement of the splenic vessels. No significant abdominal adenopathy. CT of the chest was negative for distant metastasis. She was referred to see GI Dr.Hung and will have EGD/EUS for pancreatic mass biopsy in a week.   She has been having abdominal pain after eating in the pat few month, along with diarrhea and constipation, which has been chronic since her cholecystectomy in 199. Her appetite is fare, and weight is stable. She also has tremor since teenager, and got worse lately, along with gait instaedyness, was seen by neurologist Dr. Carles Collet. She was put on primidone which has help quite bit.   She had both knee surgery and was using  a crane until recently when she was diagnosed with DVT and she has been using a walker. She has been having some gait instability lately, she was referred by Dr. Carles Collet and MRI of brain showed mild chronic small vessel ischemia disease and cerebral atrophy.  MEDICAL HISTORY:  Past Medical History  Diagnosis Date  . Arthritis   . Glaucoma   . Clotting disorder   . DVT (deep venous thrombosis)     LEFT  . GERD (gastroesophageal reflux disease)   . Hypercholesterolemia   . Peripheral neuropathy   . Essential tremor     SURGICAL HISTORY: Past Surgical History  Procedure Laterality Date  . Cholecystectomy    . Spine surgery    . Abdominal hysterectomy    . Breast lumpectomy Right   . Knee surgery Bilateral     SOCIAL HISTORY: History   Social History  . Marital Status: Divorced    Spouse Name: N/A    Number of Children: 3  . Years of Education: N/A   Occupational History   She used to work for [employer], retired now    Social History Main Topics  . Smoking status: Current Every Day Smoker -- 0.50 packs/day, for 40 years     Types: Cigarettes  . Smokeless tobacco: Not on file  . Alcohol Use: 0.0 oz/week, social drinker     0 Not specified per week     Comment: occ- glass of wine a week   . Drug Use: No  .  Sexual Activity: Not on file   Other Topics Concern  . Not on file   Social History Narrative   Divorced, 3 children   Right handed   Associates degree   3 per week    FAMILY HISTORY: Family History  Problem Relation Age of Onset  . Diabetes Father 66  . Diabetes Maternal Grandmother   . Hypertension Mother   . Glaucoma Sister   . Cataracts Maternal Grandmother   . Cancer Sister 69    Breast    ALLERGIES:  is allergic to sulfa antibiotics.  MEDICATIONS:  Current Outpatient Prescriptions  Medication Sig Dispense Refill  . acetaminophen (TYLENOL) 500 MG tablet Take 500 mg by mouth every 6 (six) hours as needed (Pain).    Marland Kitchen atorvastatin  (LIPITOR) 40 MG tablet Take 1 tablet (40 mg total) by mouth daily. 90 tablet 1  . diltiazem (CARTIA XT) 120 MG 24 hr capsule Take 1 capsule (120 mg total) by mouth daily. 90 capsule 1  . gabapentin (NEURONTIN) 300 MG capsule Take 1 capsule (300 mg total) by mouth at bedtime. After 7 days may increase to 2 at bedtime (Patient taking differently: Take 600 mg by mouth at bedtime. ) 60 capsule 2  . HYDROcodone-acetaminophen (NORCO/VICODIN) 5-325 MG per tablet Take 1 tablet by mouth every 6 (six) hours as needed for moderate pain. 10 tablet 0  . lipase/protease/amylase (CREON) 36000 UNITS CPEP capsule Take 36,000 Units by mouth 2 (two) times daily.    Marland Kitchen loratadine (CLARITIN) 10 MG tablet Take 1 tablet (10 mg total) by mouth daily. (Patient not taking: Reported on 02/09/2014) 14 tablet 0  . pantoprazole (PROTONIX) 40 MG tablet TAKE 1 TABLET BY MOUTH DAILY 30 tablet 3  . predniSONE (DELTASONE) 20 MG tablet Two tablets daily x 5 days then one tablet daily x 5 days (Patient not taking: Reported on 02/09/2014) 15 tablet 0  . primidone (MYSOLINE) 50 MG tablet Take 1 tablet (50 mg total) by mouth at bedtime. 90 tablet 1  . propranolol ER (INDERAL LA) 120 MG 24 hr capsule Take 1 capsule (120 mg total) by mouth daily. 30 capsule 2  . rivaroxaban (XARELTO) 20 MG TABS tablet Take 1 tablet (20 mg total) by mouth daily with supper. 30 tablet 5   No current facility-administered medications for this visit.    REVIEW OF SYSTEMS:   Constitutional: Denies fevers, chills or abnormal night sweats Eyes: Denies blurriness of vision, double vision or watery eyes Ears, nose, mouth, throat, and face: Denies mucositis or sore throat Respiratory: Denies cough, dyspnea or wheezes Cardiovascular: Denies palpitation, chest discomfort or lower extremity swelling Gastrointestinal:  Denies nausea, heartburn or change in bowel habits. (+) intermittent abdominal pain  Skin: Denies abnormal skin rashes Lymphatics: Denies new  lymphadenopathy or easy bruising Neurological:Denies numbness, tingling or new weaknesses Behavioral/Psych: Mood is stable, no new changes  Extremities: Bilateral lower extremity edema and leg pain, left more than right. All other systems were reviewed with the patient and are negative.  PHYSICAL EXAMINATION: ECOG PERFORMANCE STATUS: 3 - Symptomatic, >50% confined to bed  Filed Vitals:   02/16/14 1229  BP: 149/75  Pulse: 75  Temp: 98.2 F (36.8 C)  Resp: 18   Filed Weights   02/16/14 1229  Weight: 216 lb 12.8 oz (98.34 kg)    GENERAL:alert, no distress and comfortable SKIN: skin color, texture, turgor are normal, no rashes or significant lesions EYES: normal, conjunctiva are pink and non-injected, sclera clear OROPHARYNX:no exudate, no erythema  and lips, buccal mucosa, and tongue normal  NECK: supple, thyroid normal size, non-tender, without nodularity LYMPH:  no palpable lymphadenopathy in the cervical, axillary or inguinal LUNGS: clear to auscultation and percussion with normal breathing effort HEART: regular rate & rhythm and no murmurs and no lower extremity edema ABDOMEN:abdomen soft, non-tender and normal bowel sounds Musculoskeletal:no cyanosis of digits and no clubbing  PSYCH: alert & oriented x 3 with fluent speech NEURO: no focal motor/sensory deficits  LABORATORY DATA:  I have reviewed the data as listed Lab Results  Component Value Date   WBC 7.6 12/18/2013   HGB 14.3 12/18/2013   HCT 43.0 12/18/2013   MCV 96.0 12/18/2013   PLT 177 12/18/2013    Recent Labs  05/03/13 1306 05/12/13 1453 12/18/13 1306 01/04/14 1107  NA 142  --  135* 136  K 3.7  --  4.2 4.1  CL 104  --  98 104  CO2 26  --  22 26  GLUCOSE 92  --  143* 88  BUN 10  --  8 11  CREATININE 0.66  --  0.89 0.99  CALCIUM 8.7  --  9.6 9.0  GFRNONAA  --   --  66*  --   GFRAA  --   --  77*  --   PROT 7.2 6.9  --  6.8  ALBUMIN 3.8  --   --  3.6  AST 26  --   --  29  ALT 8  --   --  10    ALKPHOS 109  --   --  80  BILITOT 0.6  --   --  0.5    RADIOGRAPHIC STUDIES: I have personally reviewed the radiological images as listed and agreed with the findings in the report. Ct Chest W Contrast  02/09/2014   CLINICAL DATA:  Pancreatic adenocarcinoma, treatment planning. Staging.  EXAM: CT CHEST WITH CONTRAST  TECHNIQUE: Multidetector CT imaging of the chest was performed during intravenous contrast administration.  CONTRAST:  37mL OMNIPAQUE IOHEXOL 300 MG/ML  SOLN  COMPARISON:  12/22/2013  FINDINGS: Mediastinum/Nodes: Left axillary lymph node of 1 cm in short axis on image 20 of series 2, within normal limits. No pathologic thoracic adenopathy observed. Minimal atherosclerotic calcification of the aortic arch.  Lungs/Pleura: The lungs appear clear.  Upper abdomen: Pancreatic tail mass, hypodense right hepatic lobe lesion, IVC filter, left renal hypodense lesion which is probably a cyst, and colonic diverticulosis observed, similar to prior diagnostic CT of the abdomen.  Musculoskeletal: Mild mid thoracic spondylosis.  IMPRESSION: 1. No findings of metastatic disease in the chest.   Electronically Signed   By: Sherryl Barters M.D.   On: 02/09/2014 17:14   MR abdomen w wo contrast 01/05/2014 IMPRESSION: 1. Hypoenhancing pancreatic tail mass without findings of metastatic disease to the liver, or retroperitoneum. The only potential vascular encasement/involvement is and the splenic vessels which skirt the upper margin of the mass. The mass itself nearly abuts the posterior inferior hilum of the spleen. The pancreatic mass has typical imaging findings of pancreatic adenocarcinoma, and biopsy along with surgical referral in anticipation of possible potential distal pancreatectomy is likely indicated. 2. 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 3. Various ancillary findings include several benign appearing hepatic and left renal cysts as  follows scattered colonic diverticula.  CT abd/pel w wo contrast 12/22/2013 IMPRESSION: 1. No acute findings within the abdomen or pelvis to explain patient's  abdominal pain. 2. There is a mass within the tail of pancreas which is concerning for adenocarcinoma. Further evaluation with contrast enhanced pancreas protocol MRI is recommended along with tissue sampling. No evidence for metastatic disease. 3. Findings compatible with lower extremity DVT involving the left Leg.  Lower extremity venous duplex 12/18/2013  - Findings consistent with chronic deep vein thrombosis involving the popliteal vein in the right lower extremity. - No evidence of superficial thrombosis involving the right lower extremity. - Findings consistent with acute deep vein thrombosis involving the posterior tibial vein coursing through the popliteal, femoral, and common femoral veins in the left lower extremity. - There is evidence of a superficial thrombosis of the left proximal greater saphenous vein coursing into the and including the saphenofemoral junction.  ASSESSMENT & PLAN:  69 year old Caucasian female, with past medical history of recurrent bilateral lower extremity DVTs, presented to [facility] extensive left lower extremity DVT and thrombus in the IVC and the right, iliac vein, and chronic DVT in the right popliteal vein. She was also found to have a pancreatic tail mass on the CT scan.  1. Pancreatic tail mass -Her imaging findings is suspicious for pancreatic adenocarcinoma, also neuroendocrine tumor is also a possibility. She is scheduled to have a EGD/EUS and about pain critical mass biopsy by Dr. Benson Norway in a week. -The mass appears to be resectable. She has been evaluated by surgeon Dr. Barry Dienes and would likely to have upfront surgery. -If the biopsy and surgical past shows adenocarcinoma, I may consider adjuvant chemotherapy with single agent gemcitabine. However she does have  multiple comorbidities, poor performance status. I'll reevaluate her candidacy for adjuvant chemotherapy after her surgery. -Due to her insurance coverage issue, I'll check her tumor marker CA 19-9 if her tissue biopsy confirms adenocarcinoma.  2. Recurrent DVT  -She never had thrombophilia workup before. She is already on Xarelto, I'll check prothrombin gene mutation, factor V Leyden mutation, and antithrombin III to see if she has any genetic disorders to cause hypercoagulopathy. -Giving her recurrent unprovoked DVT, I recommend anticoagulation indefinitely. -I discussed different options of anti-coagulation. She has persistent leg pain and swollen since she started Xarelto 5-6 weeks ago, I recommend her to switch to Lovenox 100 mg every 12 hours. I'll repeat ultrasound and CT scan in 3 months, if her acute DVT results, I'll switch her to Lovenox once daily or Xarelto. -We also discussed other management options to improve her leg swelling and revent further thrombosis, such as compression stocks,  avoid smoking or hormonal replacement, and be physically active.   3. She will continue to follow-up with her primary care physician and neurologist Dr. Carles Collet for her tremor, peripheral neuropathy and gait disturbance.   Plan: -Lab: CBC and CMP today  -Switch you from [place] to lovenox -Return to clinic in 1 months, or 2-3 weeks after your surgery.   All questions were answered. The patient knows to call the clinic with any problems, questions or concerns. I spent 55 minutes counseling the patient face to face. The total time spent in the appointment was 60 minutes and more than 50% was on counseling.     Truitt Merle, MD 02/16/2014 12:45 PM

## 2014-03-01 ENCOUNTER — Encounter (HOSPITAL_COMMUNITY): Payer: Self-pay | Admitting: Gastroenterology

## 2014-03-04 ENCOUNTER — Ambulatory Visit: Payer: Medicare Other | Attending: General Surgery

## 2014-03-04 DIAGNOSIS — R29898 Other symptoms and signs involving the musculoskeletal system: Secondary | ICD-10-CM | POA: Diagnosis not present

## 2014-03-04 DIAGNOSIS — R5381 Other malaise: Secondary | ICD-10-CM

## 2014-03-04 NOTE — Therapy (Signed)
[facility] [place], [place], [address] Phone: [phone]   Fax:  [phone]  Physical Therapy Treatment  Patient Details  Name: [name] MRN: [id number] Date of Birth: 08-Feb-1945 Referring Provider:  Leandrew Koyanagi, MD  Encounter Date: 03/04/2014      PT End of Session - 03/04/14 1436    Visit Number 3   Number of Visits 7   Date for PT Re-Evaluation 03/09/14   PT Start Time 1350   PT Stop Time 1435   PT Time Calculation (min) 45 min      Past Medical History  Diagnosis Date  . Arthritis   . Glaucoma   . Clotting disorder   . DVT (deep venous thrombosis)     LEFT  . GERD (gastroesophageal reflux disease)   . Hypercholesterolemia   . Peripheral neuropathy   . Essential tremor   . Diverticulosis     Past Surgical History  Procedure Laterality Date  . Cholecystectomy    . Spine surgery    . Abdominal hysterectomy    . Breast lumpectomy Right   . Knee surgery Bilateral   . [place] filter    . Back surgery    . Eus N/A 02/26/2014    Procedure: UPPER ENDOSCOPIC ULTRASOUND (EUS) LINEAR;  Surgeon: Beryle Beams, MD;  Location: [facility];  Service: Endoscopy;  Laterality: N/A;  . Fine needle aspiration N/A 02/26/2014    Procedure: FINE NEEDLE ASPIRATION (FNA) LINEAR;  Surgeon: Beryle Beams, MD;  Location: [facility] ENDOS[facility];  Service: Endoscopy;  Laterality: N/A;    There were no vitals taken for this visit.  Visit Diagnosis:  Physical deconditioning  Weakness of both legs      Subjective Assessment - 03/04/14 1352    Symptoms My Lt thigh has been feeling okay except when I try to do the sit to stand exercise so stopped that, my stomach though hasstarted being painful now too, it didnt used to hurt. Got the results of my biopsy , doctor said it was a mature cancer which was good so just waiting to find out when I have surgery.   Currently in Pain? Yes   Pain Score 5    Pain  Location Abdomen   Pain Descriptors / Indicators Aching;Dull  Feels full   Pain Onset More than a month ago   Pain Frequency Constant   Aggravating Factors  Hurts more when constipated, after eating   Pain Relieving Factors Having a BM helps some, but get constipated sometimes and that makes it worse.                    [facility] PT Treatment/Exercise - 03/04/14 0001    Knee/Hip Exercises: Aerobic   Stationary Bike No resistance, just slow pace for ROM 5 mins  No pain in Lt thigh with this   Knee/Hip Exercises: Seated   Long Arc Quad Strengthening;Both;2 sets;5 reps   Long Arc Quad Weight 1 lbs.   Other Seated Knee Exercises Alternate marching with 1 lb 2 sets of 5 reps, hip adduction with ball squeeze 10 reps, Hip Abduction with red theraband 10 reps        Then standing bil hip 3 way raises 3 reps each and calf raises 5 reps, started having LBP so stopped.        PT Education - 03/04/14 1434    Education provided Yes   Education Details LE strengthening   Person(s)  Educated Patient;Child(ren)   Methods Explanation;Demonstration;Handout   Comprehension Verbalized understanding;Returned demonstration;Verbal cues required           Short Term Clinic Goals - 03/04/14 1438    CC Short Term Goal  #1   Title Pt. will be independent in an initial home exercise program.   Status Partially [facility] Goals - 02/09/14 1731    CC Long Term Goal  #1   Title Long term goals to be determined, as patient was hesitant to schedule therapy, and only scheduled one appointment so far.            Plan - 03/04/14 1437    Clinical Impression Statement Pt has very low motivation with exercises though did say she did some of initial HEP. Tolerated new HEP for LE today well without increase in Lt thigh pain, did have some LBP with standing exercises so pt knows to limit those to what herpain allows.   Pt will benefit from skilled therapeutic  intervention in order to improve on the following deficits Abnormal gait;Decreased endurance;Impaired sensation;Decreased strength   Rehab Potential Fair   Clinical Impairments Affecting Rehab Potential Patient with recent left thigh DVT that is still painful and limits activity, so probably will need to limit LE strengthening and endurance exercise for now.   PT Frequency 1x / week   PT Duration 4 weeks   PT Treatment/Interventions Therapeutic exercise;Patient/family education;ADLs/Self Care Home Management;Neuromuscular re-education;Gait training   PT Next Visit Plan Review HEP and progress pt with functional strengthening as she toelrates.   Consulted and Agree with Plan of Care Patient;Family member/caregiver        Problem List Patient Active Problem List   Diagnosis Date Noted  . Family history of malignant neoplasm of breast 02/26/2014  . Family history of prostate cancer 02/26/2014  . DVT (deep venous thrombosis) 01/11/2014  . Pancreatic mass on CT 11/15 12/25/2013  . Diverticular disease 12/22/2013  . History of DVT (deep vein thrombosis) 03/01/2011  . Hx pulmonary embolism 03/01/2011  . HTN (hypertension) 03/01/2011  . Hypercholesterolemia 03/01/2011  . Reflux 03/01/2011    Otelia Limes, PTA 03/04/2014, 2:39 PM  [place], [place], [address] Phone: [phone]   Fax:  [phone]

## 2014-03-04 NOTE — Patient Instructions (Signed)
ABDUCTION: Sitting - Resistance Band (Active)   Sit with feet flat with red theraband around knees. Open/close knees, holding about 3 seconds. Complete _2__ sets of _10__ repetitions. Perform _2-3__ sessions per day.  Copyright  VHI. All rights reserved.   Adduction: Hip - Knees Together (Sitting)   Sit with ball roll between knees. Push knees together. Hold for _5__ seconds. Rest for _5__ seconds. Repeat _10__ times. Do _2-3__ times a day.  Copyright  VHI. All rights reserved.  Seated Alternating Leg Raise (Marching)   Sit on chair. Raise bent knee and return. Repeat with other leg. Do _2__ sets of _5-10__ repetitions.  Copyright  VHI. All rights reserved.  KNEE: Extension, Long Arc Quads - Sitting   Raise leg until knee is straight. USING 1 LB ANKLE WEIGHT  :)  __2_ sets, _5-10__ reps, _2-3__times a day  Copyright  VHI. All rights reserved.   ALSO: Do standing exercises at walker: Leg raises out to side and back with knee straight, alternate marching and toe raises. 5 reps, 1-2 sets as able and as back pain allows.

## 2014-03-08 NOTE — Progress Notes (Signed)
[name] of the pancreatic tail showed adenocarcinoma. Genetic testing has been initiated. Leda Quail, MS, CGC

## 2014-03-09 ENCOUNTER — Other Ambulatory Visit (INDEPENDENT_AMBULATORY_CARE_PROVIDER_SITE_OTHER): Payer: Self-pay | Admitting: General Surgery

## 2014-03-11 ENCOUNTER — Ambulatory Visit: Payer: Medicare Other

## 2014-03-11 DIAGNOSIS — R5381 Other malaise: Secondary | ICD-10-CM

## 2014-03-11 DIAGNOSIS — R29898 Other symptoms and signs involving the musculoskeletal system: Secondary | ICD-10-CM

## 2014-03-11 NOTE — Therapy (Signed)
[place], [place], [address] Phone: [phone]   Fax:  [phone]  Physical Therapy Treatment  Patient Details  Name: [name] MRN: [id number] Date of Birth: March 03, 1945 Referring Provider:  Leandrew Koyanagi, MD  Encounter Date: 03/11/2014      PT End of Session - 03/11/14 1425    Visit Number 4   Number of Visits 7   Date for PT Re-Evaluation 03/09/14   PT Start Time 1351   PT Stop Time 1429   PT Time Calculation (min) 38 min      Past Medical History  Diagnosis Date  . Arthritis   . Glaucoma   . Clotting disorder   . DVT (deep venous thrombosis)     LEFT  . GERD (gastroesophageal reflux disease)   . Hypercholesterolemia   . Peripheral neuropathy   . Essential tremor   . Diverticulosis     Past Surgical History  Procedure Laterality Date  . Cholecystectomy    . Spine surgery    . Abdominal hysterectomy    . Breast lumpectomy Right   . Knee surgery Bilateral   . Greenfield filter    . Back surgery    . Eus N/A 02/26/2014    Procedure: UPPER ENDOSCOPIC ULTRASOUND (EUS) LINEAR;  Surgeon: Beryle Beams, MD;  Location: [facility];  Service: Endoscopy;  Laterality: N/A;  . Fine needle aspiration N/A 02/26/2014    Procedure: FINE NEEDLE ASPIRATION (FNA) LINEAR;  Surgeon: Beryle Beams, MD;  Location: [facility];  Service: Endoscopy;  Laterality: N/A;    There were no vitals taken for this visit.  Visit Diagnosis:  Physical deconditioning  Weakness of both legs      Subjective Assessment - 03/11/14 1359    Symptoms My back is really hurting today, cant do any UE exercises. Starting to get depressed now because my stomach pain is so bad, the results from the endoscopy showed it was cancer so Im just waiting to hear from the surgeon to get my surgery scheduled. I hope its soon because Im really uncomfortable.   Currently in Pain? Yes   Pain Score 8    Pain Location  Back   Pain Descriptors / Indicators Aching;Constant  Deep                    [facility] Adult PT Treatment/Exercise - 03/11/14 0001    Knee/Hip Exercises: Aerobic   Stationary Bike No resistance, just slow pace for ROM 6 mins   Knee/Hip Exercises: Seated   Long Arc Quad Strengthening;Both;2 sets;5 reps   Long Arc Quad Weight 1 lbs.   Other Seated Knee Exercises Alternate marching with 1 lb 2 sets of 10 reps, hip adduction with ball squeeze 10 reps, Hip Abduction with red theraband 10 reps. Rockerboard front to back, and then side to side  x2 minutes.   Other Seated Knee Exercises Hip abduction red theraband above knees 2 sets of 10 reps, and hip adduction ball squeeze (between hip abd sets) 20 reps with 2-3 second holds.                   Short Term Clinic Goals - 03/11/14 1426    CC Short Term Goal  #1   Title Pt. will be independent in an initial home exercise program.  Pt limited in what she can do currently due to pain.   Status Partially Met  [facility] Goals - 02/09/14 1731    CC Long Term Goal  #1   Title Long term goals to be determined, as patient was hesitant to schedule therapy, and only scheduled one appointment so far.            Plan - 03/11/14 1406    Clinical Impression Statement Pt was very down in her mood today. Is having increased LBP and stomach pain/bloating is unrelenting for her, affecting her all day now. She reports is eating very little due to the maximum increased stomach pain she knows she'll experience after each meal. So she does her HEP as her pain allows. Strongly encouraged pt and daughter to contact Dr Barry Dienes as they havent heard from her regarding surgery since the endoscopy results and let Dr know about pts increasing pain, discomfort, and overall decline..   Pt will benefit from skilled therapeutic intervention in order to improve on the following deficits Abnormal gait;Decreased endurance;Impaired  sensation;Decreased strength   Rehab Potential Fair   Clinical Impairments Affecting Rehab Potential Patient with recent left thigh DVT that is still painful and limits activity, so probably will need to limit LE strengthening and endurance exercise for now.   PT Frequency 1x / week   PT Duration 4 weeks   PT Treatment/Interventions Therapeutic exercise;Patient/family education;ADLs/Self Care Home Management;Neuromuscular re-education;Gait training   PT Next Visit Plan Review HEP and progress pt with functional strengthening as she toelrates.   Recommended Other Services Pt and daughter to call her surgeon (Dr. Barry Dienes) today to update her regarding pts increased pain and discomfort, they both agreed.    Consulted and Agree with Plan of Care Patient;Family member/caregiver        Problem List Patient Active Problem List   Diagnosis Date Noted  . Family history of malignant neoplasm of breast 02/26/2014  . Family history of prostate cancer 02/26/2014  . DVT (deep venous thrombosis) 01/11/2014  . Pancreatic mass on CT 11/15 12/25/2013  . Diverticular disease 12/22/2013  . History of DVT (deep vein thrombosis) 03/01/2011  . Hx pulmonary embolism 03/01/2011  . HTN (hypertension) 03/01/2011  . Hypercholesterolemia 03/01/2011  . Reflux 03/01/2011    Otelia Limes, PTA 03/11/2014, 2:34 PM  [place], [place], [address] Phone: [phone]   Fax:  [phone]

## 2014-03-18 ENCOUNTER — Ambulatory Visit: Payer: Medicare Other

## 2014-03-18 DIAGNOSIS — R5381 Other malaise: Secondary | ICD-10-CM | POA: Diagnosis not present

## 2014-03-18 DIAGNOSIS — R29898 Other symptoms and signs involving the musculoskeletal system: Secondary | ICD-10-CM

## 2014-03-18 NOTE — Patient Instructions (Signed)
Decompression Exercise: Head Support   Lie on back on firm surface, knees bent. Support under head: pillow. Time _5__ minutes. Then do head press, shoulder press, leg press, and then leg lengthener. All 5 reps for 5 seconds. 1-2 times/day.  Copyright  VHI. All rights reserved.

## 2014-03-18 NOTE — Addendum Note (Signed)
Addended by: Jomarie Longs on: 03/18/2014 05:26 PM   Modules accepted: Orders

## 2014-03-18 NOTE — Therapy (Signed)
[place], [place], [address] Phone: [phone]   Fax:  [phone]  Physical Therapy Treatment  Patient Details  Name: [name] MRN: [id number] Date of Birth: [birth year] Referring Provider:  Leandrew Koyanagi, MD  Encounter Date: 03/18/2014      PT End of Session - 03/18/14 1434    Visit Number 5   Number of Visits 7   Date for PT Re-Evaluation 03/09/14   PT Start Time 3875   PT Stop Time 1433   PT Time Calculation (min) 44 min      Past Medical History  Diagnosis Date  . Arthritis   . Glaucoma   . Clotting disorder   . DVT (deep venous thrombosis)     LEFT  . GERD (gastroesophageal reflux disease)   . Hypercholesterolemia   . Peripheral neuropathy   . Essential tremor   . Diverticulosis     Past Surgical History  Procedure Laterality Date  . Cholecystectomy    . Spine surgery    . Abdominal hysterectomy    . Breast lumpectomy Right   . Knee surgery Bilateral   . Greenfield filter    . Back surgery    . Eus N/A 02/26/2014    Procedure: UPPER ENDOSCOPIC ULTRASOUND (EUS) LINEAR;  Surgeon: Beryle Beams, MD;  [facility];  Service: Endoscopy;  Laterality: N/A;  . Fine needle aspiration N/A 02/26/2014    Procedure: FINE NEEDLE ASPIRATION (FNA) LINEAR;  Surgeon: Beryle Beams, MD;  Location[facility];  Service: Endoscopy;  Laterality: N/A;    There were no vitals taken for this visit.  Visit Diagnosis:  Physical deconditioning  Weakness of both legs      Subjective Assessment - 03/18/14 1357    Symptoms My daughter called Dr. Marlowe Aschoff office after last visit and they just got back with [place] yesterday. Im not happy with how long that took. My surgery will be 04/08/14. Have appt with Dr. Burr Medico on Monday.   Currently in Pain? Yes   Pain Score 6    Pain Location Back   Pain Orientation Upper  The "lump" in my back.   Pain Descriptors / Indicators Aching;Dull                     [facility] Adult PT Treatment/Exercise - 03/18/14 0001    Knee/Hip Exercises: Aerobic   Stationary Bike No resistance 6 minutes  Greater ease today than last week.   Knee/Hip Exercises: Seated   Long Arc Quad Strengthening;Both;2 sets  8 reps   Long Arc Quad Weight 1 lbs.   Other Seated Knee Exercises Hip adduction ball squeeze 2 sets of 10 reps. Alternate marching with 1 lb 2 sets of 8 reps.   Other Seated Knee Exercises Rockerboard front-back and side-side 2 minutes each   Knee/Hip Exercises: Supine   Straight Leg Raises AROM;Strengthening;Right;2 sets;5 reps   Straight Leg Raises Limitations Unable to do with Lt due to pain from clot   Other Supine Knee Exercises Meeks Decpompression Exercises all 5 reps for 5 seconds: Head press, shoulder press, leg press and leg lengthener  No pain with these, pt tolerated well                PT Education - 03/18/14 1432    Education provided Yes   Education Details Meeks Decompression Exs   Person(s) Educated Patient;Child(ren)  Daughter Pelaez)   Methods Explanation;Demonstration;Handout;Verbal cues   Comprehension  Verbalized understanding;Returned demonstration           Short Term Clinic Goals - 03/18/14 1446    CC Short Term Goal  #1   Title Pt. will be independent in an initial home exercise program.   Status On-going             Long Term Clinic Goals - 02/09/14 1731    CC Long Term Goal  #1   Title Long term goals to be determined, as patient was hesitant to schedule therapy, and only scheduled one appointment so far.            Plan - 03/18/14 1435    Clinical Impression Statement Pt was in slightly less pain today and tolerated treatment better. She will be having surgery on March 10 and plans to come on Thursadays until then. Added Meeks Decompression Exs today and pt tolerated very well without increase in pain. Daughter Zooey present for treatment.    Pt will benefit  from skilled therapeutic intervention in order to improve on the following deficits Abnormal gait;Decreased endurance;Impaired sensation;Decreased strength   Rehab Potential Fair   Clinical Impairments Affecting Rehab Potential Patient with recent left thigh DVT that is still painful and limits activity, so probably will need to limit LE strengthening and endurance exercise for now.   PT Frequency 1x / week   PT Duration 4 weeks   PT Treatment/Interventions Therapeutic exercise;Patient/family education;ADLs/Self Care Home Management;Neuromuscular re-education;Gait training   PT Next Visit Plan Continue with LE>UE strength as pt seems to tolerate this better, also continue with supine exs as Lt thigh pain allows.   PT Home Exercise Plan Meeks Decompression   Consulted and Agree with Plan of Care Patient;Family member/caregiver  Daughter        Problem List Patient Active Problem List   Diagnosis Date Noted  . Family history of malignant neoplasm of breast 02/26/2014  . Family history of prostate cancer 02/26/2014  . DVT (deep venous thrombosis) 01/11/2014  . Pancreatic mass on CT 11/15 12/25/2013  . Diverticular disease 12/22/2013  . History of DVT (deep vein thrombosis) 03/01/2011  . Hx pulmonary embolism 03/01/2011  . HTN (hypertension) 03/01/2011  . Hypercholesterolemia 03/01/2011  . Reflux 03/01/2011    Otelia Limes, PTA 03/18/2014, 2:47 PM  [place], [place], [address] Phone: [phone]   Fax:  [phone]

## 2014-03-18 NOTE — Therapy (Signed)
[place], [place], [address] Phone: [phone]   Fax:  [phone]  Physical Therapy Treatment  Patient Details  Name: [name] MRN: [id number] Date of Birth: 1946-01-24 Referring Provider:  Leandrew Koyanagi, MD  Encounter Date: 03/18/2014      PT End of Session - 03/18/14 1434    Visit Number 5   Number of Visits 7   Date for PT Re-Evaluation 03/09/14   PT Start Time 6440   PT Stop Time 1433   PT Time Calculation (min) 44 min      Past Medical History  Diagnosis Date  . Arthritis   . Glaucoma   . Clotting disorder   . DVT (deep venous thrombosis)     LEFT  . GERD (gastroesophageal reflux disease)   . Hypercholesterolemia   . Peripheral neuropathy   . Essential tremor   . Diverticulosis     Past Surgical History  Procedure Laterality Date  . Cholecystectomy    . Spine surgery    . Abdominal hysterectomy    . Breast lumpectomy Right   . Knee surgery Bilateral   . Greenfield filter    . Back surgery    . Eus N/A 02/26/2014    Procedure: UPPER ENDOSCOPIC ULTRASOUND (EUS) LINEAR;  Surgeon: Barbara Beams, MD;  [facility];  Service: Endoscopy;  Laterality: N/A;  . Fine needle aspiration N/A 02/26/2014    Procedure: FINE NEEDLE ASPIRATION (FNA) LINEAR;  Surgeon: Barbara Beams, MD;  [facility];  Service: Endoscopy;  Laterality: N/A;    There were no vitals taken for this visit.  Visit Diagnosis:  Physical deconditioning  Weakness of both legs      Subjective Assessment - 03/18/14 1357    Symptoms My Barbara called Dr. Marlowe [name] office after last visit and they just got back with [place] yesterday. Im not happy with how long that took. My surgery will be 04/08/14. Have appt with Dr. Burr [name] on Monday.   Currently in Pain? Yes   Pain Score 6    Pain Location Back   Pain Orientation Upper  The "lump" in my back.   Pain Descriptors / Indicators Aching;Dull                     [facility] Adult PT Treatment/Exercise - 03/18/14 0001    Knee/Hip Exercises: Aerobic   Stationary Bike No resistance 6 minutes  Greater ease today than last week.   Knee/Hip Exercises: Seated   Long Arc Quad Strengthening;Both;2 sets  8 reps   Long Arc Quad Weight 1 lbs.   Other Seated Knee Exercises Hip adduction ball squeeze 2 sets of 10 reps. Alternate marching with 1 lb 2 sets of 8 reps.   Other Seated Knee Exercises Rockerboard front-back and side-side 2 minutes each   Knee/Hip Exercises: Supine   Straight Leg Raises AROM;Strengthening;Right;2 sets;5 reps   Straight Leg Raises Limitations Unable to do with Lt due to pain from clot   Other Supine Knee Exercises Meeks Decpompression Exercises all 5 reps for 5 seconds: Head press, shoulder press, leg press and leg lengthener  No pain with these, pt tolerated well                PT Education - 03/18/14 1432    Education provided Yes   Education Details Meeks Decompression Exs   Person(s) Educated Patient;Child(ren)  [name])   Methods Explanation;Demonstration;Handout;Verbal cues   Comprehension  Verbalized understanding;Returned demonstration           Short Term Clinic Goals - 03/18/14 1446    CC Short Term Goal  #1   Title Pt. will be independent in an initial home exercise program.   Status On-going             Long Term Clinic Goals - 02/09/14 1731    CC Long Term Goal  #1   Title Long term goals to be determined, as patient was hesitant to schedule therapy, and only scheduled one appointment so far.            Plan - 03/18/14 1435    Clinical Impression Statement Pt was in slightly less pain today and tolerated treatment better. She will be having surgery on March 10 and plans to come on Thursadays until then. Added Meeks Decompression Exs today and pt tolerated very well without increase in pain. Barbara [name] present for treatment.    Pt will benefit  from skilled therapeutic intervention in order to improve on the following deficits Abnormal gait;Decreased endurance;Impaired sensation;Decreased strength   Rehab Potential Fair   Clinical Impairments Affecting Rehab Potential Patient with recent left thigh DVT that is still painful and limits activity, so probably will need to limit LE strengthening and endurance exercise for now.   PT Frequency 1x / week   PT Duration 4 weeks   PT Treatment/Interventions Therapeutic exercise;Patient/family education;ADLs/Self Care Home Management;Neuromuscular re-education;Gait training   PT Next Visit Plan Continue with LE>UE strength as pt seems to tolerate this better, also continue with supine exs as Lt thigh pain allows.   PT Home Exercise Plan Meeks Decompression   Consulted and Agree with Plan of Care Patient;Family member/caregiver  Barbara        Problem List Patient Active Problem List   Diagnosis Date Noted  . Family history of malignant neoplasm of breast 02/26/2014  . Family history of prostate cancer 02/26/2014  . DVT (deep venous thrombosis) 01/11/2014  . Pancreatic mass on CT 11/15 12/25/2013  . Diverticular disease 12/22/2013  . History of DVT (deep vein thrombosis) 03/01/2011  . Hx pulmonary embolism 03/01/2011  . HTN (hypertension) 03/01/2011  . Hypercholesterolemia 03/01/2011  . Reflux 03/01/2011    [name] 03/18/2014, 5:22 PM  [place], [place], [address] Phone: [phone]   Fax:  [phone]  Discussed patient's status with Collie Siad, Physical Therapist Assistant, who has been seeing this patient weekly over the past several weeks.  Despite the patient's initial hesitation about therapy, and despite her increasing level of pain, she has recognized her need for therapy and has been coming in and performing exercise to her tolerance.  Exercise program has had to be modified  around patient's pain, but she remains motivated and in need of conditioning and strengthening.  She is to have surgery 04/08/14.  Plan is to continue therapy 1-2x/week until surgery, working to strengthening and condition as patient is able.  After surgery, the patient is likely to need a new referral, reassessment, and continued to therapy to aid her recovery.  [place], [place]

## 2014-03-19 ENCOUNTER — Ambulatory Visit (INDEPENDENT_AMBULATORY_CARE_PROVIDER_SITE_OTHER): Payer: Medicare Other | Admitting: Family

## 2014-03-19 ENCOUNTER — Encounter: Payer: Self-pay | Admitting: Family

## 2014-03-19 VITALS — BP 150/96 | HR 85 | Temp 98.0°F | Resp 18 | Wt 212.8 lb

## 2014-03-19 DIAGNOSIS — R21 Rash and other nonspecific skin eruption: Secondary | ICD-10-CM

## 2014-03-19 MED ORDER — DOXYCYCLINE HYCLATE 100 MG PO TABS: 100.0000 mg | ORAL_TABLET | Freq: Two times a day (BID) | ORAL | Status: DC

## 2014-03-19 MED ORDER — TRAMADOL HCL 50 MG PO TABS: 50.0000 mg | ORAL_TABLET | Freq: Three times a day (TID) | ORAL | Status: DC | PRN

## 2014-03-19 NOTE — Progress Notes (Signed)
Subjective:    Patient ID: [name], female    DOB: 1945/11/27, 69 y.o.   MRN: [id number]  Chief Complaint  Patient presents with  . Establish Care    x2 weeks ago, she noticed a lump on her back and it was tender to the touch that had pain radiating to the top of her back, on thursday it looked like it turned into a bruise, says it has caused daily pain in the whole top part of her back    HPI:  [name] is a 69 y.o. female who presents today to establish care and discuss a lump on her back.  This is a new problem. Associated symptoms of a lump on the right side of her back started about 2 weeks ago. Notes that is tender to the touch and had some radiating pain to the top of her back. Pain is described as dull and achy and rated a 3/10. Has tried to use Tylenol which does not relieve the pain greatly.   Allergies  Allergen Reactions  . Sulfa Antibiotics Swelling    Current Outpatient Prescriptions on File Prior to Visit  Medication Sig Dispense Refill  . acetaminophen (TYLENOL) 500 MG tablet Take 500 mg by mouth every 6 (six) hours as needed (Pain).    Marland Kitchen atorvastatin (LIPITOR) 40 MG tablet Take 1 tablet (40 mg total) by mouth daily. 90 tablet 1  . diltiazem (CARTIA XT) 120 MG 24 hr capsule Take 1 capsule (120 mg total) by mouth daily. 90 capsule 1  . enoxaparin (LOVENOX) 100 MG/ML injection Inject 1 mL (100 mg total) into the skin every 12 (twelve) hours. 60 Syringe 3  . gabapentin (NEURONTIN) 300 MG capsule Take 1 capsule (300 mg total) by mouth at bedtime. After 7 days may increase to 2 at bedtime 60 capsule 2  . HYDROcodone-acetaminophen (NORCO/VICODIN) 5-325 MG per tablet Take 1 tablet by mouth every 6 (six) hours as needed for moderate pain. 10 tablet 0  . lipase/protease/amylase (CREON) 36000 UNITS CPEP capsule Take 36,000 Units by mouth 5 (five) times daily as needed.     . pantoprazole (PROTONIX) 40 MG tablet TAKE 1 TABLET BY MOUTH DAILY 30 tablet 3  .  propranolol ER (INDERAL LA) 120 MG 24 hr capsule Take 1 capsule (120 mg total) by mouth daily. 30 capsule 2   No current facility-administered medications on file prior to visit.    Past Medical History  Diagnosis Date  . Glaucoma   . Clotting disorder   . DVT (deep venous thrombosis)     LEFT  . GERD (gastroesophageal reflux disease)   . Hypercholesterolemia   . Peripheral neuropathy   . Essential tremor   . Diverticulosis   . Arthritis     knees and back  . Cancer     Pancreatic  . Hypertension   . Colon polyps   . Urine incontinence   . UTI (lower urinary tract infection)     Past Surgical History  Procedure Laterality Date  . Cholecystectomy    . Abdominal hysterectomy    . Knee surgery Bilateral   . Greenfield filter    . Back surgery    . Eus N/A 02/26/2014    Procedure: UPPER ENDOSCOPIC ULTRASOUND (EUS) LINEAR;  Surgeon: Beryle Beams, MD;  Location: [facility];  Service: Endoscopy;  Laterality: N/A;  . Fine needle aspiration N/A 02/26/2014    Procedure: FINE NEEDLE ASPIRATION (FNA) LINEAR;  Surgeon: Beryle Beams,  MD;  Location: [facility];  Service: Endoscopy;  Laterality: N/A;  . Breast lumpectomy Right   . Spine surgery      Family History  Problem Relation Age of Onset  . Diabetes Father   . Cancer Father 74    prostate  . Hyperlipidemia Father   . Cataracts Maternal Grandmother   . Hypertension Mother   . Arthritis Mother   . Heart disease Mother   . Glaucoma Sister   . Cancer Sister 27    Breast  . Cancer Paternal Uncle     throat +cig  . Diabetes Paternal Grandmother     History   Social History  . Marital Status: Divorced    Spouse Name: N/A  . Number of Children: N/A  . Years of Education: N/A   Occupational History  . Not on file.   Social History Main Topics  . Smoking status: Current Every Day Smoker -- 0.50 packs/day for 45 years    Types: Cigarettes  . Smokeless tobacco: Never Used  . Alcohol Use: 0.0 oz/week    0  Standard drinks or equivalent per week     Comment: occ- glass of wine a week   . Drug Use: No  . Sexual Activity: Not on file   Other Topics Concern  . Not on file   Social History Narrative   Divorced, 3 children   Right handed   Associates degree   3 per week     Review of Systems  Constitutional: Negative for fever and chills.  Gastrointestinal: Positive for constipation.  Skin: Positive for rash.      Objective:    BP 150/96 mmHg  Pulse 85  Temp(Src) 98 F (36.7 C) (Oral)  Resp 18  Wt 212 lb 12.8 oz (96.525 kg)  SpO2 93% Nursing note and vital signs reviewed.  Physical Exam  Constitutional: She is oriented to person, place, and time. She appears well-developed and well-nourished. No distress.  Cardiovascular: Normal rate, regular rhythm, normal heart sounds and intact distal pulses.   Pulmonary/Chest: Effort normal and breath sounds normal.  Neurological: She is alert and oriented to person, place, and time.  Skin: Skin is warm and dry.  Oblong 1 x 5 in firm mass/cyst noted on right back. Tender to the touch with no redness or discharge.   Psychiatric: She has a normal mood and affect. Her behavior is normal. Judgment and thought content normal.       Assessment & Plan:

## 2014-03-19 NOTE — Assessment & Plan Note (Signed)
Symptoms and exams consistent with cyst/mass. Start doxycycline. Follow up if symptoms worsen or fail to improve.

## 2014-03-19 NOTE — Progress Notes (Signed)
Pre visit review using our clinic review tool, if applicable. No additional management support is needed unless otherwise documented below in the visit note. 

## 2014-03-19 NOTE — Patient Instructions (Addendum)
Thank you for choosing [facility].  Summary/Instructions:  Your prescription(s) have been submitted to your pharmacy or been printed and provided for you. Please take as directed and contact our office if you believe you are having problem(s) with the medication(s) or have any questions.  If your symptoms worsen or fail to improve, please contact our office for further instruction, or in case of emergency go directly to the emergency room at the closest medical facility.     

## 2014-03-20 ENCOUNTER — Telehealth: Payer: Self-pay | Admitting: Family

## 2014-03-20 NOTE — Telephone Encounter (Signed)
emmi emailed °

## 2014-03-22 ENCOUNTER — Ambulatory Visit (HOSPITAL_BASED_OUTPATIENT_CLINIC_OR_DEPARTMENT_OTHER): Payer: Medicare Other | Admitting: Hematology

## 2014-03-22 ENCOUNTER — Telehealth: Payer: Self-pay | Admitting: Hematology

## 2014-03-22 ENCOUNTER — Encounter: Payer: Self-pay | Admitting: Hematology

## 2014-03-22 VITALS — BP 135/62 | HR 76 | Temp 98.4°F | Resp 18 | Ht 69.5 in | Wt 210.4 lb

## 2014-03-22 DIAGNOSIS — C252 Malignant neoplasm of tail of pancreas: Secondary | ICD-10-CM | POA: Insufficient documentation

## 2014-03-22 DIAGNOSIS — I82431 Acute embolism and thrombosis of right popliteal vein: Secondary | ICD-10-CM

## 2014-03-22 DIAGNOSIS — I82412 Acute embolism and thrombosis of left femoral vein: Secondary | ICD-10-CM

## 2014-03-22 DIAGNOSIS — C259 Malignant neoplasm of pancreas, unspecified: Secondary | ICD-10-CM

## 2014-03-22 DIAGNOSIS — Z86718 Personal history of other venous thrombosis and embolism: Secondary | ICD-10-CM

## 2014-03-22 DIAGNOSIS — Z86711 Personal history of pulmonary embolism: Secondary | ICD-10-CM

## 2014-03-22 NOTE — Telephone Encounter (Signed)
Gave avs & calendar for March. °

## 2014-03-22 NOTE — Progress Notes (Signed)
[place]  [phone] Fax:([phone]) [place] Note   Patient Care Team: Mauricio Po, FNP as PCP - General (Family Medicine) Beryle Beams, MD as Consulting Physician (Gastroenterology) Truitt Merle, MD as Consulting Physician (Hematology) [name], MD as Consulting Physician (General Surgery) 03/22/2014  CHIEF COMPLAINTS/PURPOSE OF CONSULTATION:  Recurrent DVT and pancreatic adenocarcinoma   Oncology History   Pancreatic adenocarcinoma   Staging form: Pancreas, AJCC 7th Edition     Clinical: Stage IB (T2, N0, M0) - Unsigned       Pancreatic adenocarcinoma   12/22/2013 Imaging CT abdomen showed a 2.8cm mass in the tail of pancrease. CT chest was negative.    02/26/2014 Pathology Results FNA of pancreatic mass showed adenocarcinoma    03/22/2014 Initial Diagnosis Pancreatic adenocarcinoma    HISTORY OF PRESENTING ILLNESS:  [name] 69 y.o. 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 She had IVC filter placed back then and put on coumadin. She had second episode of right leg DVT when she was on coumadin in 2009, and continued coumadin until one year ago when she had trouble for monitoring her INR. She was then switched to Aspirin 81mg  dialy. 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 CT abdomen was ordered to further evaluate the DVT and abdominal pain on 12/22/2013, which showed thrombus in IVC and right common iliac vein below IVC. CT scan also showed a 2.8 x 2.5 cm mass within the tail of pancreas, this was confirmed by the MRI abdomen, with probably encasement of the splenic vessels. No significant  abdominal adenopathy. CT of the chest was negative for distant metastasis. She was referred to see GI Dr.Hung and will have EGD/EUS for pancreatic mass biopsy in a week.   She has been having abdominal pain after eating in the pat few month, along with diarrhea and constipation, which has been chronic since her cholecystectomy in 199. Her appetite is fare, and weight is stable. She also has tremor since teenager, and got worse lately, along with gait instaedyness, was seen by neurologist Dr. Carles Collet. She was put on primidone which has help quite bit.   She had both knee surgery and was using a crane until recently when she was diagnosed with DVT and she has been using a walker. She has been having some gait instability lately, she was referred by Dr. Carles Collet and MRI of brain showed mild chronic small vessel ischemia disease and cerebral atrophy.  INTERIM HISTORY [name] returns for follow-up. She underwent EGD/EUS and fine needle biopsy of the pancreatic mass, which showed adeno carcinoma. She is scheduled to follow-up with Dr. Barry Dienes on this Friday, and is scheduled to have partial pancreatectomy and splenectomy on 04/08/2014. Her leg edema and pain in the left side has much improved since her switch to Lovenox her last time. No other new complaints. She doesn't have moderate or fatigue, which has been getting worse lately. Her appetite is also little low, no significant weight loss.   MEDICAL HISTORY:  Past Medical History  Diagnosis Date  . Glaucoma   . Clotting disorder   . DVT (deep venous thrombosis)  LEFT  . GERD (gastroesophageal reflux disease)   . Hypercholesterolemia   . Peripheral neuropathy   . Essential tremor   . Diverticulosis   . Arthritis     knees and back  . Cancer     Pancreatic  . Hypertension   . Colon polyps   . Urine incontinence   . UTI (lower urinary tract infection)     SURGICAL HISTORY: Past Surgical History  Procedure Laterality Date  . Cholecystectomy      . Abdominal hysterectomy    . Knee surgery Bilateral   . [place] filter    . Back surgery    . Eus N/A 02/26/2014    Procedure: UPPER ENDOSCOPIC ULTRASOUND (EUS) LINEAR;  Surgeon: Beryle Beams, MD;  Location: [facility];  Service: Endoscopy;  Laterality: N/A;  . Fine needle aspiration N/A 02/26/2014    Procedure: FINE NEEDLE ASPIRATION (FNA) LINEAR;  Surgeon: Beryle Beams, MD;  Location: [facility];  Service: Endoscopy;  Laterality: N/A;  . Breast lumpectomy Right   . Spine surgery      SOCIAL HISTORY: History   Social History  . Marital Status: Divorced    Spouse Name: N/A    Number of Children: 3  . Years of Education: N/A   Occupational History   She used to work for [employer], retired now    Social History Main Topics  . Smoking status: Current Every Day Smoker -- 0.50 packs/day, for 40 years     Types: Cigarettes  . Smokeless tobacco: Not on file  . Alcohol Use: 0.0 oz/week, social drinker     0 Not specified per week     Comment: occ- glass of wine a week   . Drug Use: No  . Sexual Activity: Not on file   Other Topics Concern  . Not on file   Social History Narrative   Divorced, 3 children   Right handed   Associates degree   3 per week    FAMILY HISTORY: Family History  Problem Relation Age of Onset  . Diabetes Father 16  . Diabetes Maternal Grandmother   . Hypertension Mother   . Glaucoma Sister   . Cataracts Maternal Grandmother   . Cancer Sister 37    Breast    ALLERGIES:  is allergic to sulfa antibiotics.  MEDICATIONS:  Current Outpatient Prescriptions  Medication Sig Dispense Refill  . acetaminophen (TYLENOL) 500 MG tablet Take 500 mg by mouth every 6 (six) hours as needed (Pain).    Marland Kitchen atorvastatin (LIPITOR) 40 MG tablet Take 1 tablet (40 mg total) by mouth daily. 90 tablet 1  . diltiazem (CARTIA XT) 120 MG 24 hr capsule Take 1 capsule (120 mg total) by mouth daily. 90 capsule 1  . doxycycline (VIBRA-TABS) 100 MG  tablet Take 1 tablet (100 mg total) by mouth 2 (two) times daily. 20 tablet 0  . enoxaparin (LOVENOX) 100 MG/ML injection Inject 1 mL (100 mg total) into the skin every 12 (twelve) hours. 60 Syringe 3  . gabapentin (NEURONTIN) 300 MG capsule Take 1 capsule (300 mg total) by mouth at bedtime. After 7 days may increase to 2 at bedtime 60 capsule 2  . HYDROcodone-acetaminophen (NORCO/VICODIN) 5-325 MG per tablet Take 1 tablet by mouth every 6 (six) hours as needed for moderate pain. 10 tablet 0  . lipase/protease/amylase (CREON) 36000 UNITS CPEP capsule Take 36,000 Units by mouth 5 (five) times daily as needed.     . pantoprazole (PROTONIX) 40  MG tablet TAKE 1 TABLET BY MOUTH DAILY 30 tablet 3  . propranolol ER (INDERAL LA) 120 MG 24 hr capsule Take 1 capsule (120 mg total) by mouth daily. 30 capsule 2  . traMADol (ULTRAM) 50 MG tablet Take 1 tablet (50 mg total) by mouth every 8 (eight) hours as needed. 40 tablet 0   No current facility-administered medications for this visit.    REVIEW OF SYSTEMS:   Constitutional: Denies fevers, chills or abnormal night sweats, (+) fatigue  Eyes: Denies blurriness of vision, double vision or watery eyes Ears, nose, mouth, throat, and face: Denies mucositis or sore throat Respiratory: Denies cough, dyspnea or wheezes Cardiovascular: Denies palpitation, chest discomfort or lower extremity swelling Gastrointestinal:  Denies nausea, heartburn or change in bowel habits. (+) intermittent abdominal pain  Skin: Denies abnormal skin rashes Lymphatics: Denies new lymphadenopathy or easy bruising Neurological:Denies numbness, tingling or new weaknesses Behavioral/Psych: Mood is stable, no new changes  Extremities: Bilateral lower extremity edema and leg pain, left more than right. All other systems were reviewed with the patient and are negative.  PHYSICAL EXAMINATION: ECOG PERFORMANCE STATUS: 3 - Symptomatic, >50% confined to bed  Filed Vitals:   03/22/14 1609   BP: 135/62  Pulse: 76  Temp: 98.4 F (36.9 C)  Resp: 18   Filed Weights   03/22/14 1609  Weight: 210 lb 6.4 oz (95.437 kg)    GENERAL:alert, no distress and comfortable SKIN: skin color, texture, turgor are normal, no rashes. She has some bruises on her abdomen at the Lovenox injection site. She also has a 2 cm subcutaneous nodule on the right flank area, likely a hematoma. EYES: normal, conjunctiva are pink and non-injected, sclera clear OROPHARYNX:no exudate, no erythema and lips, buccal mucosa, and tongue normal  NECK: supple, thyroid normal size, non-tender, without nodularity LYMPH:  no palpable lymphadenopathy in the cervical, axillary or inguinal LUNGS: clear to auscultation and percussion with normal breathing effort HEART: regular rate & rhythm and no murmurs and no lower extremity edema ABDOMEN:abdomen soft, non-tender and normal bowel sounds Musculoskeletal:no cyanosis of digits and no clubbing  PSYCH: alert & oriented x 3 with fluent speech NEURO: no focal motor/sensory deficits Ext: (+) Bilateral Ankle swelling  LABORATORY DATA:  I have reviewed the data as listed Lab Results  Component Value Date   WBC 5.4 02/16/2014   HGB 13.7 02/16/2014   HCT 41.7 02/16/2014   MCV 94.3 02/16/2014   PLT 177 02/16/2014    Recent Labs  05/03/13 1306 05/12/13 1453 12/18/13 1306 01/04/14 1107 02/16/14 1443  NA 142  --  135* 136 140  K 3.7  --  4.2 4.1 3.8  CL 104  --  98 104  --   CO2 26  --  22 26 27   GLUCOSE 92  --  143* 88 97  BUN 10  --  8 11 7.7  CREATININE 0.66  --  0.89 0.99 0.7  CALCIUM 8.7  --  9.6 9.0 9.3  GFRNONAA  --   --  66*  --   --   GFRAA  --   --  77*  --   --   PROT 7.2 6.9  --  6.8 7.2  ALBUMIN 3.8  --   --  3.6 3.6  AST 26  --   --  29 24  ALT 8  --   --  10 10  ALKPHOS 109  --   --  80 96  BILITOT 0.6  --   --  0.5 0.42   PATHOLOGY REPORT 02/26/2014 Diagnosis FINE NEEDLE ASPIRATION, ENDOSCOPIC, PANCREAS MALIGNANT CELLS PRESENT, SEE  COMMENT.  Comment There are malignant cells with prominent nucleili and intracytoplasmic vacuoles, arranged in acinar pattern. The overall morphologic findings favor a diagnosis of well differentiated adenocarcinoma.  RADIOGRAPHIC STUDIES: I have personally reviewed the radiological images as listed and agreed with the findings in the report. No results found. MR abdomen w wo contrast 01/05/2014 IMPRESSION: 1. Hypoenhancing pancreatic tail mass without findings of metastatic disease to the liver, or retroperitoneum. The only potential vascular encasement/involvement is and the splenic vessels which skirt the upper margin of the mass. The mass itself nearly abuts the posterior inferior hilum of the spleen. The pancreatic mass has typical imaging findings of pancreatic adenocarcinoma, and biopsy along with surgical referral in anticipation of possible potential distal pancreatectomy is likely indicated. 2. 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 3. Various ancillary findings include several benign appearing hepatic and left renal cysts as follows scattered colonic diverticula.  CT abd/pel w wo contrast 12/22/2013 IMPRESSION: 1. No acute findings within the abdomen or pelvis to explain patient's abdominal pain. 2. There is a mass within the tail of pancreas which is concerning for adenocarcinoma. Further evaluation with contrast enhanced pancreas protocol MRI is recommended along with tissue sampling. No evidence for metastatic disease. 3. Findings compatible with lower extremity DVT involving the left Leg.  Lower extremity venous duplex 12/18/2013  - Findings consistent with chronic deep vein thrombosis involving the popliteal vein in the right lower extremity. - No evidence of superficial thrombosis involving the right lower extremity. - Findings consistent with acute deep vein thrombosis involving the posterior tibial  vein coursing through the popliteal, femoral, and common femoral veins in the left lower extremity. - There is evidence of a superficial thrombosis of the left proximal greater saphenous vein coursing into the and including the saphenofemoral junction.  ASSESSMENT & PLAN:  69 year old Caucasian female, with past medical history of recurrent bilateral lower extremity DVTs, presented to [facility] extensive left lower extremity DVT and thrombus in the IVC and the right, iliac vein, and chronic DVT in the right popliteal vein. She was also found to have a pancreatic tail mass on the CT scan.  1. Pancreatic adenocarcinoma, cT2 N0 M0, stage IB -I reviewed her pancreatic mass biopsy results with patient and her daughter. Due to the location of the mass and early stage, this is surgically resectable.  She is scheduled for surgery 04/08/2014.  -I discussed the role of adjuvant chemotherapy after her surgery. We usually recommend single agent gemcitabine as adjuvant therapy, however her performance status has been poor. I'll reevaluate her candidacy for adjuvant chemotherapy after her surgery. - I'll check her tumor marker CA 19-9 before her surgery.  2. Recurrent DVT  -I reviewed her lab results of prothrombin gene mutation, factor V Leyden mutation, and antithrombin III, which were all negative. -Giving her recurrent unprovoked DVT, I recommend anticoagulation indefinitely. -She is tolerating Lovenox well. She needs to stop Lovenox 1 day before her surgery, and restart a few days after surgery. I'll repeat ultrasound and CT scan in 3 months, if her acute DVT results, I'll switch her to Lovenox once daily or Xarelto. -We also discussed other management options to improve her leg swelling and revent further thrombosis, such as compression stocks,  avoid smoking or hormonal replacement, and be physically active.   3. Subcutaneous nodule at  the right Pilar Plate area -likely a hematoma from  Lovenox  4. She will continue to follow-up with her primary care physician and neurologist Dr. Carles Collet for her tremor, peripheral neuropathy and gait disturbance.   Plan: -Return to clinic in 5-6 weeks and we will discuss adjuvant gemcitabine.  All questions were answered. The patient knows to call the clinic with any problems, questions or concerns. I spent 20 minutes counseling the patient face to face. The total time spent in the appointment was 25 minutes and more than 50% was on counseling.     Truitt Merle, MD 03/22/2014 8:07 PM

## 2014-03-23 ENCOUNTER — Telehealth: Payer: Self-pay | Admitting: Hematology

## 2014-03-23 NOTE — Telephone Encounter (Signed)
Patient confirm appointment for April 7. Mailed calendar

## 2014-03-25 ENCOUNTER — Ambulatory Visit: Payer: Medicare Other

## 2014-03-26 ENCOUNTER — Other Ambulatory Visit: Payer: Self-pay | Admitting: Physician Assistant

## 2014-03-26 ENCOUNTER — Other Ambulatory Visit: Payer: Self-pay | Admitting: Internal Medicine

## 2014-03-29 ENCOUNTER — Telehealth: Payer: Self-pay | Admitting: Family

## 2014-03-29 ENCOUNTER — Encounter: Payer: Self-pay | Admitting: Genetic Counselor

## 2014-03-29 DIAGNOSIS — Z8042 Family history of malignant neoplasm of prostate: Secondary | ICD-10-CM

## 2014-03-29 DIAGNOSIS — C259 Malignant neoplasm of pancreas, unspecified: Secondary | ICD-10-CM

## 2014-03-29 DIAGNOSIS — Z803 Family history of malignant neoplasm of breast: Secondary | ICD-10-CM

## 2014-03-29 NOTE — Telephone Encounter (Signed)
Pt was wondering if she can come and get the injection for Heningococcal and Haemothilus influnza vaccine as directed by Dr. Barry Dienes from [facility] surgery. Please advise and call pt back today if possible.

## 2014-03-29 NOTE — Progress Notes (Signed)
[facility]  Genetic Test Results   PRIMARY PROVIDER:  Mauricio Po, FNP  PRIMARY REASON FOR VISIT:  Personal history of pancreatic cancer  GENETIC TEST RESULT:  Testing Laboratory: [facility]  Test Ordered: PancNext gene panel Date of Report: 03/26/2014 Result: Normal, no pathogenic mutations identified General Interpretation: Reassuring  HPI: Ms. Highley was previously seen in the [facility] due to concerns regarding a hereditary predisposition to cancer. Please refer to our prior cancer genetics clinic note for more information regarding Ms. Palacios medical, social and family histories, and our assessment and recommendations, at the time. Ms. Nadeau genetic test results and recommendations warranted by these results were recently disclosed to her and are discussed in more detail below.  GENETIC TEST RESULTS: At the time of Ms. Szumski's visit, we recommended she pursue genetic testing, which includes sequencing and deletion/duplication analysis of several genes associated with an increased risk for cancer via a gene panel. The PancNext gene panel offered by [facility] includes sequencing and rearrangement analysis for the following 13 genes: APC, ATM, BRCA1, BRCA2, CDKN2A, EPCAM, MLH1, MSH2, MSH6, PALB2, PMS2, STK11, and TP53. Genetic testing for this gene panel was normal and did not reveal a pathogenic mutation in any of these genes. A copy of the genetic test report will be scanned into Epic under the media tab.   We discussed with Ms. Bryars that current genetic testing is not perfect, and it is, therefore, possible there may be a pathogenic gene mutation in one of these genes that current testing cannot detect, but that chance is small.  We also discussed, that it is possible that another gene that has not yet been discovered, or that we have not yet tested, is responsible for the cancer diagnoses in her family. It is,  therefore, important for Ms. Hollick to continue to remain in touch with cancer genetics so that we can continue to offer Ms. Lothrop the most up to date genetic testing.   CANCER SCREENING RECOMMENDATIONS: This result is reassuring and indicates that Ms. Pendergraft likely does not have an increased risk for a future cancer due to a mutation in one of these genes. This normal test also suggests that Ms. Daidone cancer was most likely not due to an inherited predisposition associated with one of these genes.  Most cancers happen by chance and this negative test suggests that her cancer falls into this category.  We, therefore, recommended she continue to follow the cancer management and screening guidelines provided by her oncology and primary healthcare provider.   RECOMMENDATIONS FOR FAMILY MEMBERS:  Individuals in this family might be at some increased risk of developing cancer, over the general population risk, simply due to the family history of cancer.  We recommended women in this family have a yearly mammogram beginning at age 54, an annual clinical breast exam, and perform monthly breast self-exams. Women in this family should also have a gynecological exam as recommended by their primary provider. All family members should have a colonoscopy by age 49, an annual dermatological exam and continue to follow cancer screening guidelines recommended by their healthcare provider.  FOLLOW-UP: Lastly, we discussed with Ms. Clum that cancer genetics is a rapidly advancing field and it is likely that new genetic tests will be appropriate for her and/or family members in the future. We encouraged her to remain in contact with cancer genetics on an annual basis so we can update her personal and family  histories and let her know of advances in cancer genetics that may benefit this family.   Our contact number was provided. Ms. Gemmill questions were answered to her satisfaction, and she knows she is welcome to call us at  anytime with additional questions or concerns.    Catherine A. Fine, MS, CGC Certified Psychologist, [email or web address] Phone: [phone]

## 2014-03-29 NOTE — Telephone Encounter (Signed)
Pt aware. Sent her to scheduling to make an appointment.

## 2014-03-29 NOTE — Telephone Encounter (Signed)
Yes, she can schedule a nurse visit for both the meningococcal and influenza vaccinations.

## 2014-03-30 ENCOUNTER — Ambulatory Visit: Payer: Medicare Other | Attending: General Surgery | Admitting: Physical Therapy

## 2014-03-30 ENCOUNTER — Ambulatory Visit (INDEPENDENT_AMBULATORY_CARE_PROVIDER_SITE_OTHER): Payer: Medicare Other

## 2014-03-30 DIAGNOSIS — R29898 Other symptoms and signs involving the musculoskeletal system: Secondary | ICD-10-CM | POA: Insufficient documentation

## 2014-03-30 DIAGNOSIS — Z23 Encounter for immunization: Secondary | ICD-10-CM

## 2014-03-30 DIAGNOSIS — R5381 Other malaise: Secondary | ICD-10-CM | POA: Insufficient documentation

## 2014-03-30 NOTE — Patient Instructions (Addendum)
[name]  03/30/2014   Your procedure is scheduled on: 04/08/2014     Report to [facility] Main  Entrance and follow signs to               Circle at      [place].  Call this number if you have problems the morning of surgery [phone]   Remember: fleets enema nite before surgery   Do not eat food or drink liquids :After Midnight.     Take these medicines the morning of surgery with A SIP OF WATER:  Combigan eye drops, Protonix                                You may not have any metal on your body including hair pins and              piercings  Do not wear jewelry, make-up, lotions, powders or perfumes., deodorant.               Do not wear nail polish.  Do not shave  48 hours prior to surgery.                Do not bring valuables to the hospital. [place].  Contacts, dentures or bridgework may not be worn into surgery.  Leave suitcase in the car. After surgery it may be brought to your room.         Special Instructions: coughing and deep breathing exercises, leg exercises               Please read over the following fact sheets you were given: _____________________________________________________________________             [facility] - Preparing for Surgery Before surgery, you can play an important role.  Because skin is not sterile, your skin needs to be as free of germs as possible.  You can reduce the number of germs on your skin by washing with CHG (chlorahexidine gluconate) soap before surgery.  CHG is an antiseptic cleaner which kills germs and bonds with the skin to continue killing germs even after washing. Please DO NOT use if you have an allergy to CHG or antibacterial soaps.  If your skin becomes reddened/irritated stop using the CHG and inform your nurse when you arrive at Short Stay. Do not shave (including legs and underarms) for at least 48 hours prior to the first  CHG shower.  You may shave your face/neck. Please follow these instructions carefully:  1.  Shower with CHG Soap the night before surgery and the  morning of Surgery.  2.  If you choose to wash your hair, wash your hair first as usual with your  normal  shampoo.  3.  After you shampoo, rinse your hair and body thoroughly to remove the  shampoo.                           4.  Use CHG as you would any other liquid soap.  You can apply chg directly  to the skin and wash  Gently with a scrungie or clean washcloth.  5.  Apply the CHG Soap to your body ONLY FROM THE NECK DOWN.   Do not use on face/ open                           Wound or open sores. Avoid contact with eyes, ears mouth and genitals (private parts).                       Wash face,  Genitals (private parts) with your normal soap.             6.  Wash thoroughly, paying special attention to the area where your surgery  will be performed.  7.  Thoroughly rinse your body with warm water from the neck down.  8.  DO NOT shower/wash with your normal soap after using and rinsing off  the CHG Soap.                9.  Pat yourself dry with a clean towel.            10.  Wear clean pajamas.            11.  Place clean sheets on your bed the night of your first shower and do not  sleep with pets. Day of Surgery : Do not apply any lotions/deodorants the morning of surgery.  Please wear clean clothes to the hospital/surgery center.  FAILURE TO FOLLOW THESE INSTRUCTIONS MAY RESULT IN THE CANCELLATION OF YOUR SURGERY PATIENT SIGNATURE_________________________________  NURSE SIGNATURE__________________________________  ________________________________________________________________________  WHAT IS A BLOOD TRANSFUSION? Blood Transfusion Information  A transfusion is the replacement of blood or some of its parts. Blood is made up of multiple cells which provide different functions.  Red blood cells carry oxygen and are used  for blood loss replacement.  White blood cells fight against infection.  Platelets control bleeding.  Plasma helps clot blood.  Other blood products are available for specialized needs, such as hemophilia or other clotting disorders. BEFORE THE TRANSFUSION  Who gives blood for transfusions?   Healthy volunteers who are fully evaluated to make sure their blood is safe. This is blood bank blood. Transfusion therapy is the safest it has ever been in the practice of medicine. Before blood is taken from a donor, a complete history is taken to make sure that person has no history of diseases nor engages in risky social behavior (examples are intravenous drug use or sexual activity with multiple partners). The donor's travel history is screened to minimize risk of transmitting infections, such as malaria. The donated blood is tested for signs of infectious diseases, such as HIV and hepatitis. The blood is then tested to be sure it is compatible with you in order to minimize the chance of a transfusion reaction. If you or a relative donates blood, this is often done in anticipation of surgery and is not appropriate for emergency situations. It takes many days to process the donated blood. RISKS AND COMPLICATIONS Although transfusion therapy is very safe and saves many lives, the main dangers of transfusion include:   Getting an infectious disease.  Developing a transfusion reaction. This is an allergic reaction to something in the blood you were given. Every precaution is taken to prevent this. The decision to have a blood transfusion has been considered carefully by your caregiver before blood is given. Blood is not given unless the benefits outweigh  the risks. AFTER THE TRANSFUSION  Right after receiving a blood transfusion, you will usually feel much better and more energetic. This is especially true if your red blood cells have gotten low (anemic). The transfusion raises the level of the red blood  cells which carry oxygen, and this usually causes an energy increase.  The nurse administering the transfusion will monitor you carefully for complications. HOME CARE INSTRUCTIONS  No special instructions are needed after a transfusion. You may find your energy is better. Speak with your caregiver about any limitations on activity for underlying diseases you may have. SEEK MEDICAL CARE IF:   Your condition is not improving after your transfusion.  You develop redness or irritation at the intravenous (IV) site. SEEK IMMEDIATE MEDICAL CARE IF:  Any of the following symptoms occur over the next 12 hours:  Shaking chills.  You have a temperature by mouth above 102 F (38.9 C), not controlled by medicine.  Chest, back, or muscle pain.  People around you feel you are not acting correctly or are confused.  Shortness of breath or difficulty breathing.  Dizziness and fainting.  You get a rash or develop hives.  You have a decrease in urine output.  Your urine turns a dark color or changes to pink, red, or brown. Any of the following symptoms occur over the next 10 days:  You have a temperature by mouth above 102 F (38.9 C), not controlled by medicine.  Shortness of breath.  Weakness after normal activity.  The white part of the eye turns yellow (jaundice).  You have a decrease in the amount of urine or are urinating less often.  Your urine turns a dark color or changes to pink, red, or brown. Document Released: 01/13/2000 Document Revised: 04/09/2011 Document Reviewed: 09/01/2007 [facility] Patient Information 2014 [place], [place].  _______________________________________________________________________

## 2014-03-30 NOTE — Therapy (Signed)
[name], [place], [address] Phone: [phone]   Fax:  [phone]  Physical Therapy Treatment  Patient Details  Name: [name] MRN: [id number] Date of Birth: [birth year] Referring Provider:  Leandrew Koyanagi, MD  Encounter Date: 03/30/2014      PT End of Session - 03/30/14 1730    Visit Number 6   Number of Visits 7   Date for PT Re-Evaluation 04/08/14   PT Start Time 1520   PT Stop Time 1605   PT Time Calculation (min) 45 min   Activity Tolerance Patient limited by pain      Past Medical History  Diagnosis Date  . Glaucoma   . Clotting disorder   . DVT (deep venous thrombosis)     LEFT  . GERD (gastroesophageal reflux disease)   . Hypercholesterolemia   . Peripheral neuropathy   . Essential tremor   . Diverticulosis   . Arthritis     knees and back  . Cancer     Pancreatic  . Hypertension   . Colon polyps   . Urine incontinence   . UTI (lower urinary tract infection)     Past Surgical History  Procedure Laterality Date  . Cholecystectomy    . Abdominal hysterectomy    . Knee surgery Bilateral   . [place] filter    . Back surgery    . Eus N/A 02/26/2014    Procedure: UPPER ENDOSCOPIC ULTRASOUND (EUS) LINEAR;  Surgeon: Beryle Beams, MD;  Location: [facility] ENDOSCOPY;  Service: Endoscopy;  Laterality: N/A;  . Fine needle aspiration N/A 02/26/2014    Procedure: FINE NEEDLE ASPIRATION (FNA) LINEAR;  Surgeon: Beryle Beams, MD;  Location: [facility] ENDOSCOPY;  Service: Endoscopy;  Laterality: N/A;  . Breast lumpectomy Right   . Spine surgery      There were no vitals taken for this visit.  Visit Diagnosis:  Physical deconditioning  Weakness of both legs      Subjective Assessment - 03/30/14 1522    Symptoms Couple changes in meds; still planning surgery for 04/08/14.  Got a vaccine today and will have another one before surgery.   Currently in Pain? Yes   Pain Score 3     Pain Location Abdomen  and across back   Aggravating Factors  legs exercises sometimes aggravate the back and sometimes the leg DVT area   Pain Relieving Factors heating pad on bed                    [facility] Adult PT Treatment/Exercise - 03/30/14 0001    Knee/Hip Exercises: Aerobic   Stationary Bike No resistance x 6 mins.   Knee/Hip Exercises: Seated   Long Stage manager sets  8 reps   Long Arc Quad Weight 1 lbs.  0 lbs. on left, only one set on left   Other Seated Knee Exercises hip adductor squeezes on ball 10 x    Other Seated Knee Exercises Rockerboard front/back and side to side x 1 min. ea.   Knee/Hip Exercises: Supine   Straight Leg Raises AROM;10 reps;Other (comment)  done in sitting today   Straight Leg Raises Limitations only 6 x on left; couldn't do more   Other Supine Knee Exercises Meeks Decpompression Exercises all 5 reps for 5 seconds: Head press, shoulder press, leg press and leg lengthener  did only two leg press on left due to discomfort      Will need  new order for therapy re-evaluation and treatment following her surgery on 04/08/14.             Short Term Clinic Goals - 03/30/14 1733    CC Short Term Goal  #1   Status Achieved             Long Term Clinic Goals - 02/09/14 1731    CC Long Term Goal  #1   Title Long term goals to be determined, as patient was hesitant to schedule therapy, and only scheduled one appointment so far.            Plan - 03/30/14 1731    Clinical Impression Statement Pt. tolerated treatment, but with limitation for exercising left LE due to pain, and had some increase in pain following session.  Will not return until after surgery on 04/08/14; pt.'s daughter knows she will need a new order at that time.   Pt will benefit from skilled therapeutic intervention in order to improve on the following deficits Abnormal gait;Decreased endurance;Impaired sensation;Decreased strength   Rehab  Potential Fair   Clinical Impairments Affecting Rehab Potential Patient with recent left thigh DVT that is still painful and limits activity, so probably will need to limit LE strengthening and endurance exercise for now.   PT Frequency 1x / week   PT Duration 2 weeks   PT Treatment/Interventions Therapeutic exercise   PT Next Visit Plan Probable re-evaluation after surgery, when okayed by surgeon.   Consulted and Agree with Plan of Care Patient;Family member/caregiver        Problem List Patient Active Problem List   Diagnosis Date Noted  . Pancreatic adenocarcinoma 03/22/2014  . Rash of back 03/19/2014  . Family history of malignant neoplasm of breast 02/26/2014  . Family history of prostate cancer 02/26/2014  . DVT (deep venous thrombosis) 01/11/2014  . Pancreatic mass on CT 11/15 12/25/2013  . Diverticular disease 12/22/2013  . History of DVT (deep vein thrombosis) 03/01/2011  . Hx pulmonary embolism 03/01/2011  . HTN (hypertension) 03/01/2011  . Hypercholesterolemia 03/01/2011  . Reflux 03/01/2011    [name] 03/30/2014, 5:34 PM  [place], [place], [address] Phone: [phone]   Fax:  [phone]  Serafina Royals, [place]

## 2014-03-31 ENCOUNTER — Telehealth: Payer: Self-pay

## 2014-03-31 NOTE — Telephone Encounter (Signed)
Pt came in for a nurse visit yesterday. She needed the meningococcal vaccine and the haemophilus Influenza vaccine.  We were able to administer the meningococcal vaccine. However, we did not have the Hib nor did any of our other sites.  I contacted [facility] [phone]) with the above information. RN Tammy will inform Dr. Barry Dienes. They will contact pt when advisement has been given.  Pt has been contacted regarding the same and informing them that [facility] will contact with pt next step.

## 2014-03-31 NOTE — Telephone Encounter (Signed)
Noted  

## 2014-04-01 ENCOUNTER — Encounter (HOSPITAL_COMMUNITY)
Admission: RE | Admit: 2014-04-01 | Discharge: 2014-04-01 | Disposition: A | Discharge status: Home / Self Care | Payer: Medicare Other | Source: Ambulatory Visit | Attending: General Surgery | Admitting: General Surgery

## 2014-04-01 ENCOUNTER — Encounter (HOSPITAL_COMMUNITY): Payer: Self-pay

## 2014-04-01 DIAGNOSIS — Z01812 Encounter for preprocedural laboratory examination: Secondary | ICD-10-CM | POA: Diagnosis not present

## 2014-04-01 DIAGNOSIS — C259 Malignant neoplasm of pancreas, unspecified: Secondary | ICD-10-CM | POA: Insufficient documentation

## 2014-04-01 LAB — CBC WITH DIFFERENTIAL/PLATELET
BASOS PCT: 0 % (ref 0–1)
Basophils Absolute: 0 10*3/uL (ref 0.0–0.1)
EOS ABS: 0.1 10*3/uL (ref 0.0–0.7)
Eosinophils Relative: 2 % (ref 0–5)
HEMATOCRIT: 44.7 % (ref 36.0–46.0)
Hemoglobin: 14.7 g/dL (ref 12.0–15.0)
Lymphocytes Relative: 37 % (ref 12–46)
Lymphs Abs: 1.7 10*3/uL (ref 0.7–4.0)
MCH: 31 pg (ref 26.0–34.0)
MCHC: 32.9 g/dL (ref 30.0–36.0)
MCV: 94.3 fL (ref 78.0–100.0)
MONO ABS: 0.4 10*3/uL (ref 0.1–1.0)
MONOS PCT: 8 % (ref 3–12)
NEUTROS PCT: 53 % (ref 43–77)
Neutro Abs: 2.5 10*3/uL (ref 1.7–7.7)
Platelets: 134 10*3/uL — ABNORMAL LOW (ref 150–400)
RBC: 4.74 MIL/uL (ref 3.87–5.11)
RDW: 13.2 % (ref 11.5–15.5)
WBC: 4.7 10*3/uL (ref 4.0–10.5)

## 2014-04-01 LAB — COMPREHENSIVE METABOLIC PANEL
ALBUMIN: 3.9 g/dL (ref 3.5–5.2)
ALT: 21 U/L (ref 0–35)
ANION GAP: 7 (ref 5–15)
AST: 35 U/L (ref 0–37)
Alkaline Phosphatase: 75 U/L (ref 39–117)
BUN: 9 mg/dL (ref 6–23)
CALCIUM: 9.5 mg/dL (ref 8.4–10.5)
CO2: 28 mmol/L (ref 19–32)
CREATININE: 0.66 mg/dL (ref 0.50–1.10)
Chloride: 107 mmol/L (ref 96–112)
GFR calc non Af Amer: 89 mL/min — ABNORMAL LOW (ref >90)
Glucose, Bld: 107 mg/dL — ABNORMAL HIGH (ref 70–99)
Potassium: 4.4 mmol/L (ref 3.5–5.1)
Sodium: 142 mmol/L (ref 135–145)
Total Bilirubin: 0.7 mg/dL (ref 0.3–1.2)
Total Protein: 7.6 g/dL (ref 6.0–8.3)

## 2014-04-01 LAB — PROTIME-INR
INR: 1.11 (ref 0.00–1.49)
Prothrombin Time: 14.5 seconds (ref 11.6–15.2)

## 2014-04-01 LAB — AMYLASE: Amylase: 80 U/L (ref 0–105)

## 2014-04-01 NOTE — Progress Notes (Signed)
EKG- 12/20/13 EPIC  CT CHEST- 1/11/20106

## 2014-04-07 NOTE — H&P (Signed)
[name] 03/26/2014 11:26 AM Location: [facility] Patient #: [id number] DOB: [birth year] Divorced / Language: [name] / Race: Black or African American Female  History of Present Illness Barbara Klein MD; 03/26/2014 12:26 PM) Patient words: questions prior to surgery.  The patient is a 69 year old female who presents with pancreatic cancer. Previous history [name] is a 69 year old female who presents with an incidentally noted pancreatic tail mass. She was found to have new leg swelling. She was found to have a DVT. Abdomen and pelvic CT was ordered to see how further clot extended up. She was found to have a 2.8 cm mass in the tail of her pancreas that extended into the splenic hilum. She did have abdominal pain as well. There were no acute findings. The mass was better evaluated with an MRI. The MRI demonstrated the mass to be 4.6 cm and are just diameter with internal necrosis. The posterior margin of the mass was adherent to the splenic vessels. She was also seen to have some thrombus in the cava and right common iliac vein below the filter. She denies weight loss. She has had diarrhea. She has had diarrhea since her gallbladder taken out. However she is to be able to adjust her diet to control this. She is now having diarrhea no matter what she eats. She has used a walker for multiple years because of feeling unsteady on her feet. She saw a neurologist for this but is not sure what the final decision was for why she felt unsteady. She is not a diabetic.  The patient is not sure if she underwent a hypercoagulable workup when she had an original DVT around 10 years ago. She does not recall seeing hematology oncology. The patient has had a colonoscopy. Of note her sister had breast cancer in her 82s and her father had prostate cancer.] Patient has been having issues with bloating now. Her diarrhea has resolved. She continues to have vague generalized abdominal  pain. She tried tramadol which did not work. 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 She is on Lovenox but it was felt that this was performed after she could hold her anticoagulation and have surgery. She did try Creon and this did not seem to help with her GI issues. We increased the dose and that did not help either. The medication was around $90 for her co-pay which she cannot afford on a chronic basis. She underwent endoscopic ultrasound and cytology was positive for adenocarcinoma. She does appear to have very close proximity to the splenic vessels as the mass is right up in the splenic hilum. This is consistent with her CT scan.   Allergies [name], CMA; 03/26/2014 11:27 AM) Sulfa 10 *OPHTHALMIC AGENTS* No Known Drug Allergies02/26/2016 (Marked as Inactive)  Medication History [name], CMA; 03/26/2014 11:27 AM) Creon (36000UNIT Capsule DR Part, 1 (one) Capsule DR Part Oral as needed, Taken starting 02/08/2014) Active. (Take 2 caps with meals and one cap with snacks) Atorvastatin Calcium (40MG  Tablet, Oral) Active. Cartia XT (120MG  Capsule ER 24HR, Oral) Active. Pantoprazole Sodium (40MG  Tablet DR, Oral) Active. Propranolol HCl ER (120MG  Capsule ER 24HR, Oral) Active. Primidone (50MG  Tablet, Oral) Active. Xarelto (20MG  Tablet, Oral) Active. Gabapentin (300MG  Capsule, Oral) Active. Medications Reconciled  Review of Systems Barbara Klein MD; 03/26/2014 12:26 PM) All other systems negative   Vitals [name] CMA; 03/26/2014 11:27 AM) 03/26/2014 11:27 AM Weight: 210 lb Height: 69in Body Surface Area: 2.15 m Body Mass Index: 31.01  kg/m Temp.: 96.46F(Temporal)  Pulse: 62 (Regular)  Resp.: 15 (Unlabored)  BP: 122/82 (Sitting, Left Arm, Standard)    Physical Exam Barbara Klein MD; 03/26/2014 12:26 PM) General Mental Status-Alert. General Appearance-Consistent with stated age. Hydration-Well hydrated. Voice-Normal.  Head  and Neck Head-normocephalic, atraumatic with no lesions or palpable masses.  Eye Sclera/Conjunctiva - Bilateral-No scleral icterus.  Chest and Lung Exam Chest and lung exam reveals -quiet, even and easy respiratory effort with no use of accessory muscles. Inspection Chest Wall - Normal. Back - normal.  Breast - Did not examine.  Cardiovascular Cardiovascular examination reveals -normal pedal pulses bilaterally. Note: regular rate and rhythm  Abdomen Inspection-Inspection Normal. Palpation/Percussion Palpation and Percussion of the abdomen reveal - Soft, Non Tender, No Rebound tenderness, No Rigidity (guarding) and No hepatosplenomegaly. Note: bloating.   Peripheral Vascular Upper Extremity Inspection - Bilateral - Normal - No Clubbing, No Cyanosis, No Edema, Pulses Intact. Lower Extremity Palpation - Edema - Bilateral - No edema.  Neurologic Neurologic evaluation reveals -alert and oriented x 3 with no impairment of recent or remote memory. Mental Status-Normal.  Musculoskeletal Global Assessment -Note: no gross deformities.  Normal Exam - Left-Upper Extremity Strength Normal and Lower Extremity Strength Normal. Normal Exam - Right-Upper Extremity Strength Normal and Lower Extremity Strength Normal.  Lymphatic Head & Neck  General Head & Neck Lymphatics: Bilateral - Description - Normal. Axillary  General Axillary Region: Bilateral - Description - Normal. Tenderness - Non Tender.    Assessment & Plan Barbara Klein MD; 03/26/2014 12:28 PM) PANCREATIC ADENOCARCINOMA (157.9  C25.9) Impression: Patient is scheduled for robotic-assisted distal pancreatectomy and splenectomy on March 10. We reviewed the anatomy and reviewed the surgery. I discussed that we may be able to take the specimen out through a Pfannenstiel incision however this may not be the case if there is a lot of scar tissue in the pelvis. I discussed that the bleeding may not improve  holding her surgery. I also reviewed that if metastatic disease is found that we would not proceed with resection. I advised the patient to do an enema the night before surgery to decrease her constipation postoperatively.  30 minutes were spent with the patient and her daughter in consultation regarding this matter. Greater than 50% of time was used for counseling. Current Plans  Started Hydrocodone-Acetaminophen 5-325MG , 1-2 Tablet q 4 prn, #50, 03/26/2014, No Refill. Started Maalox Max 400-400-40MG /5ML, 30 ml q6 hours as needed for bloating., 1 Bottle, 03/26/2014, Ref. x2. Instructions:  Take lovenox as scheduled through 3/8.  Do not take any more until after surgery.  Get vaccines today.  Take enema the night before surgery.  Hold creon until surgery.  Try over the counter maalox 3-4 times a day as needed for bloating.   Signed by Barbara Klein, MD (03/26/2014 12:28 PM)

## 2014-04-08 ENCOUNTER — Inpatient Hospital Stay (HOSPITAL_COMMUNITY): Payer: Medicare Other | Admitting: Registered Nurse

## 2014-04-08 ENCOUNTER — Inpatient Hospital Stay (HOSPITAL_COMMUNITY)
Admission: RE | Admit: 2014-04-08 | Discharge: 2014-05-04 | DRG: 405 | Disposition: A | Discharge status: Skilled Nursing Facility | Payer: Medicare Other | Source: Ambulatory Visit | Attending: General Surgery | Admitting: General Surgery

## 2014-04-08 ENCOUNTER — Encounter (HOSPITAL_COMMUNITY): Payer: Self-pay | Admitting: *Deleted

## 2014-04-08 ENCOUNTER — Encounter (HOSPITAL_COMMUNITY)
Admission: RE | Disposition: A | Discharge status: Skilled Nursing Facility | Payer: Self-pay | Source: Ambulatory Visit | Attending: General Surgery

## 2014-04-08 DIAGNOSIS — B965 Pseudomonas (aeruginosa) (mallei) (pseudomallei) as the cause of diseases classified elsewhere: Secondary | ICD-10-CM | POA: Diagnosis present

## 2014-04-08 DIAGNOSIS — C252 Malignant neoplasm of tail of pancreas: Principal | ICD-10-CM | POA: Diagnosis present

## 2014-04-08 DIAGNOSIS — I959 Hypotension, unspecified: Secondary | ICD-10-CM | POA: Diagnosis not present

## 2014-04-08 DIAGNOSIS — Y839 Surgical procedure, unspecified as the cause of abnormal reaction of the patient, or of later complication, without mention of misadventure at the time of the procedure: Secondary | ICD-10-CM | POA: Diagnosis present

## 2014-04-08 DIAGNOSIS — N170 Acute kidney failure with tubular necrosis: Secondary | ICD-10-CM | POA: Diagnosis not present

## 2014-04-08 DIAGNOSIS — Z01812 Encounter for preprocedural laboratory examination: Secondary | ICD-10-CM

## 2014-04-08 DIAGNOSIS — Z7901 Long term (current) use of anticoagulants: Secondary | ICD-10-CM | POA: Diagnosis not present

## 2014-04-08 DIAGNOSIS — G253 Myoclonus: Secondary | ICD-10-CM | POA: Diagnosis present

## 2014-04-08 DIAGNOSIS — F17210 Nicotine dependence, cigarettes, uncomplicated: Secondary | ICD-10-CM | POA: Diagnosis present

## 2014-04-08 DIAGNOSIS — Z803 Family history of malignant neoplasm of breast: Secondary | ICD-10-CM | POA: Diagnosis not present

## 2014-04-08 DIAGNOSIS — J90 Pleural effusion, not elsewhere classified: Secondary | ICD-10-CM | POA: Diagnosis not present

## 2014-04-08 DIAGNOSIS — K3184 Gastroparesis: Secondary | ICD-10-CM | POA: Diagnosis present

## 2014-04-08 DIAGNOSIS — Z79899 Other long term (current) drug therapy: Secondary | ICD-10-CM | POA: Diagnosis not present

## 2014-04-08 DIAGNOSIS — R627 Adult failure to thrive: Secondary | ICD-10-CM | POA: Diagnosis present

## 2014-04-08 DIAGNOSIS — Z8042 Family history of malignant neoplasm of prostate: Secondary | ICD-10-CM

## 2014-04-08 DIAGNOSIS — E876 Hypokalemia: Secondary | ICD-10-CM | POA: Diagnosis not present

## 2014-04-08 DIAGNOSIS — N3941 Urge incontinence: Secondary | ICD-10-CM | POA: Diagnosis present

## 2014-04-08 DIAGNOSIS — J9811 Atelectasis: Secondary | ICD-10-CM | POA: Diagnosis not present

## 2014-04-08 DIAGNOSIS — D62 Acute posthemorrhagic anemia: Secondary | ICD-10-CM | POA: Diagnosis not present

## 2014-04-08 DIAGNOSIS — I10 Essential (primary) hypertension: Secondary | ICD-10-CM | POA: Diagnosis present

## 2014-04-08 DIAGNOSIS — I82503 Chronic embolism and thrombosis of unspecified deep veins of lower extremity, bilateral: Secondary | ICD-10-CM | POA: Diagnosis present

## 2014-04-08 DIAGNOSIS — Z86718 Personal history of other venous thrombosis and embolism: Secondary | ICD-10-CM

## 2014-04-08 DIAGNOSIS — T814XXA Infection following a procedure, initial encounter: Secondary | ICD-10-CM | POA: Diagnosis not present

## 2014-04-08 DIAGNOSIS — J189 Pneumonia, unspecified organism: Secondary | ICD-10-CM | POA: Diagnosis not present

## 2014-04-08 DIAGNOSIS — L0291 Cutaneous abscess, unspecified: Secondary | ICD-10-CM

## 2014-04-08 DIAGNOSIS — K219 Gastro-esophageal reflux disease without esophagitis: Secondary | ICD-10-CM | POA: Diagnosis present

## 2014-04-08 DIAGNOSIS — K651 Peritoneal abscess: Secondary | ICD-10-CM | POA: Diagnosis not present

## 2014-04-08 DIAGNOSIS — R062 Wheezing: Secondary | ICD-10-CM

## 2014-04-08 DIAGNOSIS — E440 Moderate protein-calorie malnutrition: Secondary | ICD-10-CM | POA: Diagnosis present

## 2014-04-08 DIAGNOSIS — R338 Other retention of urine: Secondary | ICD-10-CM | POA: Diagnosis present

## 2014-04-08 DIAGNOSIS — N179 Acute kidney failure, unspecified: Secondary | ICD-10-CM

## 2014-04-08 DIAGNOSIS — R197 Diarrhea, unspecified: Secondary | ICD-10-CM | POA: Diagnosis present

## 2014-04-08 HISTORY — PX: XI ROBOTIC ASSISTED LAPAROSCOPIC DISTAL PANCREATECTOMY: SHX6539

## 2014-04-08 LAB — PROTIME-INR
INR: 1.06 (ref 0.00–1.49)
Prothrombin Time: 13.9 seconds (ref 11.6–15.2)

## 2014-04-08 LAB — CBC
HCT: 31.8 % — ABNORMAL LOW (ref 36.0–46.0)
Hemoglobin: 10.3 g/dL — ABNORMAL LOW (ref 12.0–15.0)
MCH: 30.5 pg (ref 26.0–34.0)
MCHC: 32.4 g/dL (ref 30.0–36.0)
MCV: 94.1 fL (ref 78.0–100.0)
Platelets: 140 10*3/uL — ABNORMAL LOW (ref 150–400)
RBC: 3.38 MIL/uL — AB (ref 3.87–5.11)
RDW: 13.3 % (ref 11.5–15.5)
WBC: 8.8 10*3/uL (ref 4.0–10.5)

## 2014-04-08 LAB — TYPE AND SCREEN
ABO/RH(D): AB NEG
Antibody Screen: NEGATIVE

## 2014-04-08 LAB — GLUCOSE, CAPILLARY: GLUCOSE-CAPILLARY: 148 mg/dL — AB (ref 70–99)

## 2014-04-08 LAB — APTT: aPTT: 40 seconds — ABNORMAL HIGH (ref 24–37)

## 2014-04-08 SURGERY — PANCREATECTOMY, DISTAL, ROBOT-ASSISTED
Anesthesia: Monitor Anesthesia Care | Site: Abdomen

## 2014-04-08 MED ORDER — BUPIVACAINE-EPINEPHRINE 0.25% -1:200000 IJ SOLN
INTRAMUSCULAR | Status: DC | PRN
  Administered 2014-04-08: 20 mL

## 2014-04-08 MED ORDER — PHENYLEPHRINE 40 MCG/ML (10ML) SYRINGE FOR IV PUSH (FOR BLOOD PRESSURE SUPPORT)
PREFILLED_SYRINGE | INTRAVENOUS | Status: AC
  Filled 2014-04-08: qty 10

## 2014-04-08 MED ORDER — LIDOCAINE HCL (PF) 1 % IJ SOLN
INTRAMUSCULAR | Status: DC | PRN
  Administered 2014-04-08: 20 mL

## 2014-04-08 MED ORDER — MIDAZOLAM HCL 5 MG/5ML IJ SOLN
INTRAMUSCULAR | Status: DC | PRN
  Administered 2014-04-08: 2 mg via INTRAVENOUS

## 2014-04-08 MED ORDER — ALBUMIN HUMAN 5 % IV SOLN
INTRAVENOUS | Status: DC | PRN
  Administered 2014-04-08: 11:00:00 via INTRAVENOUS

## 2014-04-08 MED ORDER — HYDROCODONE-ACETAMINOPHEN 5-325 MG PO TABS: 1.0000 | ORAL_TABLET | Freq: Four times a day (QID) | ORAL | Status: DC | PRN

## 2014-04-08 MED ORDER — GABAPENTIN 300 MG PO CAPS
300.0000 mg | ORAL_CAPSULE | Freq: Every day | ORAL | Status: DC
  Administered 2014-04-08 – 2014-04-21 (×14): 300 mg via ORAL
  Filled 2014-04-08 (×15): qty 1

## 2014-04-08 MED ORDER — ONDANSETRON HCL 4 MG/2ML IJ SOLN
INTRAMUSCULAR | Status: DC | PRN
  Administered 2014-04-08: 4 mg via INTRAVENOUS

## 2014-04-08 MED ORDER — NEOSTIGMINE METHYLSULFATE 10 MG/10ML IV SOLN
INTRAVENOUS | Status: DC | PRN
  Administered 2014-04-08: 5 mg via INTRAVENOUS

## 2014-04-08 MED ORDER — LIDOCAINE HCL (CARDIAC) 20 MG/ML IV SOLN
INTRAVENOUS | Status: AC
  Filled 2014-04-08: qty 5

## 2014-04-08 MED ORDER — PROPOFOL 10 MG/ML IV BOLUS
INTRAVENOUS | Status: AC
  Filled 2014-04-08: qty 20

## 2014-04-08 MED ORDER — ONDANSETRON HCL 4 MG/2ML IJ SOLN: 4.0000 mg | Freq: Four times a day (QID) | INTRAMUSCULAR | Status: DC | PRN

## 2014-04-08 MED ORDER — GLYCOPYRROLATE 0.2 MG/ML IJ SOLN
INTRAMUSCULAR | Status: AC
  Filled 2014-04-08: qty 3

## 2014-04-08 MED ORDER — ONDANSETRON HCL 4 MG PO TABS
4.0000 mg | ORAL_TABLET | Freq: Four times a day (QID) | ORAL | Status: DC | PRN
  Administered 2014-04-09 – 2014-04-28 (×2): 4 mg via ORAL
  Filled 2014-04-08 (×4): qty 1

## 2014-04-08 MED ORDER — TISSEEL VH 10 ML EX KIT
PACK | CUTANEOUS | Status: DC | PRN
  Administered 2014-04-08: 10 mL

## 2014-04-08 MED ORDER — DEXAMETHASONE SODIUM PHOSPHATE 10 MG/ML IJ SOLN
INTRAMUSCULAR | Status: AC
  Filled 2014-04-08: qty 1

## 2014-04-08 MED ORDER — TIMOLOL MALEATE 0.5 % OP SOLN
1.0000 [drp] | Freq: Two times a day (BID) | OPHTHALMIC | Status: DC
  Administered 2014-04-08 – 2014-05-04 (×52): 1 [drp] via OPHTHALMIC
  Filled 2014-04-08 (×2): qty 5

## 2014-04-08 MED ORDER — PHENYLEPHRINE HCL 10 MG/ML IJ SOLN
INTRAMUSCULAR | Status: DC | PRN
  Administered 2014-04-08: 40 ug via INTRAVENOUS
  Administered 2014-04-08 (×4): 80 ug via INTRAVENOUS
  Administered 2014-04-08: 40 ug via INTRAVENOUS

## 2014-04-08 MED ORDER — LIDOCAINE HCL (CARDIAC) 20 MG/ML IV SOLN
INTRAVENOUS | Status: DC | PRN
  Administered 2014-04-08: 80 mg via INTRAVENOUS

## 2014-04-08 MED ORDER — PROMETHAZINE HCL 25 MG/ML IJ SOLN: 6.2500 mg | INTRAMUSCULAR | Status: DC | PRN

## 2014-04-08 MED ORDER — EPHEDRINE SULFATE 50 MG/ML IJ SOLN
INTRAMUSCULAR | Status: DC | PRN
  Administered 2014-04-08 (×2): 10 mg via INTRAVENOUS
  Administered 2014-04-08 (×2): 5 mg via INTRAVENOUS
  Administered 2014-04-08: 10 mg via INTRAVENOUS

## 2014-04-08 MED ORDER — MORPHINE SULFATE 2 MG/ML IJ SOLN: 1.0000 mg | INTRAMUSCULAR | Status: DC | PRN

## 2014-04-08 MED ORDER — EPHEDRINE SULFATE 50 MG/ML IJ SOLN
INTRAMUSCULAR | Status: AC
  Filled 2014-04-08: qty 1

## 2014-04-08 MED ORDER — ALUM & MAG HYDROXIDE-SIMETH 200-200-20 MG/5ML PO SUSP: 30.0000 mL | Freq: Four times a day (QID) | ORAL | Status: DC | PRN

## 2014-04-08 MED ORDER — LACTATED RINGERS IR SOLN
Status: DC | PRN
  Administered 2014-04-08: 1000 mL

## 2014-04-08 MED ORDER — KETAMINE HCL 10 MG/ML IJ SOLN
INTRAMUSCULAR | Status: DC | PRN
  Administered 2014-04-08 (×2): 20 mg via INTRAVENOUS

## 2014-04-08 MED ORDER — LACTATED RINGERS IV SOLN
INTRAVENOUS | Status: DC | PRN
  Administered 2014-04-08 (×4): via INTRAVENOUS

## 2014-04-08 MED ORDER — PRIMIDONE 50 MG PO TABS
50.0000 mg | ORAL_TABLET | Freq: Every day | ORAL | Status: DC
  Administered 2014-04-08 – 2014-05-03 (×25): 50 mg via ORAL
  Filled 2014-04-08 (×28): qty 1

## 2014-04-08 MED ORDER — FENTANYL CITRATE 0.05 MG/ML IJ SOLN
25.0000 ug | INTRAMUSCULAR | Status: DC | PRN
  Administered 2014-04-08 (×2): 50 ug via INTRAVENOUS

## 2014-04-08 MED ORDER — GLYCOPYRROLATE 0.2 MG/ML IJ SOLN
INTRAMUSCULAR | Status: DC | PRN
  Administered 2014-04-08: 0.6 mg via INTRAVENOUS

## 2014-04-08 MED ORDER — MORPHINE SULFATE (PF) 1 MG/ML IV SOLN
INTRAVENOUS | Status: DC
  Administered 2014-04-08: 14:00:00 via INTRAVENOUS
  Administered 2014-04-09: 1 mg via INTRAVENOUS
  Administered 2014-04-09: 10:00:00 via INTRAVENOUS
  Administered 2014-04-09: 5.9 mg via INTRAVENOUS
  Administered 2014-04-09: 2 mg via INTRAVENOUS
  Administered 2014-04-09: 6 mg via INTRAVENOUS
  Administered 2014-04-10: 3 mg via INTRAVENOUS
  Administered 2014-04-10: 4 mg via INTRAVENOUS
  Administered 2014-04-10: 5.65 mg via INTRAVENOUS
  Administered 2014-04-11: 5 mg via INTRAVENOUS
  Administered 2014-04-11: 10 mg via INTRAVENOUS
  Administered 2014-04-11: 6 mg via INTRAVENOUS
  Administered 2014-04-12: 4 mg via INTRAVENOUS
  Administered 2014-04-12 (×2): 5 mg via INTRAVENOUS
  Administered 2014-04-12 – 2014-04-13 (×2): 2 mg via INTRAVENOUS
  Administered 2014-04-13: 0 mg via INTRAVENOUS
  Administered 2014-04-13: 3 mg via INTRAVENOUS
  Administered 2014-04-13: 0.9999 mg via INTRAVENOUS
  Administered 2014-04-13: 3 mg via INTRAVENOUS
  Administered 2014-04-13: 5 mg via INTRAVENOUS
  Administered 2014-04-14: 2 mg via INTRAVENOUS
  Administered 2014-04-14: 1 mg via INTRAVENOUS
  Administered 2014-04-14: 0 mg via INTRAVENOUS
  Administered 2014-04-15: 1 mg via INTRAVENOUS
  Administered 2014-04-15: 0 mg via INTRAVENOUS
  Administered 2014-04-15: 16:00:00 via INTRAVENOUS
  Administered 2014-04-15: 8 mg via INTRAVENOUS
  Administered 2014-04-16: 7 mg via INTRAVENOUS
  Administered 2014-04-16: 11 mg via INTRAVENOUS
  Administered 2014-04-16: 10:00:00 via INTRAVENOUS
  Administered 2014-04-16: 2 mg via INTRAVENOUS
  Administered 2014-04-17: 13:00:00 via INTRAVENOUS
  Administered 2014-04-17: 9.99 mg via INTRAVENOUS
  Administered 2014-04-17: 39.62 mg via INTRAVENOUS
  Administered 2014-04-17: 5 mg via INTRAVENOUS
  Administered 2014-04-17: 4 mg via INTRAVENOUS
  Administered 2014-04-17: 3.99 mg via INTRAVENOUS
  Administered 2014-04-18: 4 mg via INTRAVENOUS
  Administered 2014-04-18 (×2): 3 mg via INTRAVENOUS
  Administered 2014-04-18: 20:00:00 via INTRAVENOUS
  Administered 2014-04-18: 3 mg via INTRAVENOUS
  Administered 2014-04-19: 0 mg via INTRAVENOUS
  Administered 2014-04-19: 2 mg via INTRAVENOUS
  Administered 2014-04-19: 13 mg via INTRAVENOUS
  Administered 2014-04-19: 1 mg via INTRAVENOUS
  Administered 2014-04-19: 4 mg via INTRAVENOUS
  Administered 2014-04-20: 1 mg via INTRAVENOUS
  Administered 2014-04-20: 0 mg via INTRAVENOUS
  Administered 2014-04-20: 5 mg via INTRAVENOUS
  Administered 2014-04-20: 1 mg via INTRAVENOUS
  Administered 2014-04-21: 0 mg via INTRAVENOUS
  Administered 2014-04-21: 1.99 mg via INTRAVENOUS
  Filled 2014-04-08 (×10): qty 25

## 2014-04-08 MED ORDER — DILTIAZEM HCL ER COATED BEADS 120 MG PO CP24
120.0000 mg | ORAL_CAPSULE | Freq: Every day | ORAL | Status: DC
  Administered 2014-04-08 – 2014-04-18 (×8): 120 mg via ORAL
  Filled 2014-04-08 (×14): qty 1

## 2014-04-08 MED ORDER — DIPHENHYDRAMINE HCL 12.5 MG/5ML PO ELIX: 12.5000 mg | ORAL_SOLUTION | Freq: Four times a day (QID) | ORAL | Status: DC | PRN

## 2014-04-08 MED ORDER — TRAMADOL HCL 50 MG PO TABS
50.0000 mg | ORAL_TABLET | Freq: Four times a day (QID) | ORAL | Status: DC | PRN
  Administered 2014-04-21 – 2014-04-22 (×2): 50 mg via ORAL
  Filled 2014-04-08 (×3): qty 1

## 2014-04-08 MED ORDER — FENTANYL CITRATE 0.05 MG/ML IJ SOLN
INTRAMUSCULAR | Status: AC
  Filled 2014-04-08: qty 2

## 2014-04-08 MED ORDER — ROCURONIUM BROMIDE 100 MG/10ML IV SOLN
INTRAVENOUS | Status: AC
  Filled 2014-04-08: qty 1

## 2014-04-08 MED ORDER — ONDANSETRON HCL 4 MG/2ML IJ SOLN
4.0000 mg | Freq: Four times a day (QID) | INTRAMUSCULAR | Status: DC | PRN
  Administered 2014-04-15 – 2014-04-29 (×7): 4 mg via INTRAVENOUS
  Filled 2014-04-08 (×7): qty 2

## 2014-04-08 MED ORDER — ONDANSETRON HCL 4 MG/2ML IJ SOLN
INTRAMUSCULAR | Status: AC
  Filled 2014-04-08: qty 2

## 2014-04-08 MED ORDER — 0.9 % SODIUM CHLORIDE (POUR BTL) OPTIME
TOPICAL | Status: DC | PRN
  Administered 2014-04-08: 2000 mL

## 2014-04-08 MED ORDER — MEPERIDINE HCL 50 MG/ML IJ SOLN: 6.2500 mg | INTRAMUSCULAR | Status: DC | PRN

## 2014-04-08 MED ORDER — DIPHENHYDRAMINE HCL 50 MG/ML IJ SOLN: 12.5000 mg | Freq: Four times a day (QID) | INTRAMUSCULAR | Status: DC | PRN

## 2014-04-08 MED ORDER — ATORVASTATIN CALCIUM 40 MG PO TABS
40.0000 mg | ORAL_TABLET | Freq: Every day | ORAL | Status: DC
  Administered 2014-04-08 – 2014-05-03 (×25): 40 mg via ORAL
  Filled 2014-04-08 (×16): qty 1
  Filled 2014-04-08: qty 4
  Filled 2014-04-08 (×3): qty 1
  Filled 2014-04-08 (×2): qty 4
  Filled 2014-04-08 (×7): qty 1

## 2014-04-08 MED ORDER — ACETAMINOPHEN 500 MG PO TABS: 500.0000 mg | ORAL_TABLET | Freq: Four times a day (QID) | ORAL | Status: DC | PRN

## 2014-04-08 MED ORDER — LACTATED RINGERS IV SOLN
INTRAVENOUS | Status: DC | PRN
  Administered 2014-04-08 (×2): via INTRAVENOUS

## 2014-04-08 MED ORDER — NEOSTIGMINE METHYLSULFATE 10 MG/10ML IV SOLN
INTRAVENOUS | Status: AC
  Filled 2014-04-08: qty 1

## 2014-04-08 MED ORDER — ACETAMINOPHEN 10 MG/ML IV SOLN
1000.0000 mg | Freq: Once | INTRAVENOUS | Status: AC
  Administered 2014-04-08: 1000 mg via INTRAVENOUS
  Filled 2014-04-08: qty 100

## 2014-04-08 MED ORDER — BUPIVACAINE-EPINEPHRINE (PF) 0.25% -1:200000 IJ SOLN
INTRAMUSCULAR | Status: AC
  Filled 2014-04-08: qty 30

## 2014-04-08 MED ORDER — CEFAZOLIN SODIUM-DEXTROSE 2-3 GM-% IV SOLR
INTRAVENOUS | Status: AC
  Filled 2014-04-08: qty 50

## 2014-04-08 MED ORDER — DEXAMETHASONE SODIUM PHOSPHATE 4 MG/ML IJ SOLN
INTRAMUSCULAR | Status: DC | PRN
  Administered 2014-04-08: 10 mg via INTRAVENOUS

## 2014-04-08 MED ORDER — METOCLOPRAMIDE HCL 5 MG/ML IJ SOLN
INTRAMUSCULAR | Status: AC
  Filled 2014-04-08: qty 2

## 2014-04-08 MED ORDER — CEFOTETAN DISODIUM 2 G IJ SOLR
2.0000 g | INTRAMUSCULAR | Status: DC | PRN
  Administered 2014-04-08: 2 g via INTRAVENOUS

## 2014-04-08 MED ORDER — FENTANYL CITRATE 0.05 MG/ML IJ SOLN
INTRAMUSCULAR | Status: AC
  Filled 2014-04-08: qty 5

## 2014-04-08 MED ORDER — KCL IN DEXTROSE-NACL 20-5-0.45 MEQ/L-%-% IV SOLN
INTRAVENOUS | Status: DC
  Administered 2014-04-08 – 2014-04-09 (×2): via INTRAVENOUS
  Administered 2014-04-09: 1000 mL via INTRAVENOUS
  Administered 2014-04-10 – 2014-04-18 (×11): via INTRAVENOUS
  Administered 2014-04-19: 1000 mL via INTRAVENOUS
  Administered 2014-04-21: 50 mL/h via INTRAVENOUS
  Filled 2014-04-08 (×27): qty 1000

## 2014-04-08 MED ORDER — DEXTROSE 5 % IV SOLN
2.0000 g | Freq: Two times a day (BID) | INTRAVENOUS | Status: AC
  Administered 2014-04-08 – 2014-04-09 (×2): 2 g via INTRAVENOUS
  Filled 2014-04-08 (×2): qty 2

## 2014-04-08 MED ORDER — PROPRANOLOL HCL ER 60 MG PO CP24
60.0000 mg | ORAL_CAPSULE | Freq: Every day | ORAL | Status: DC
  Administered 2014-04-08 – 2014-04-17 (×7): 60 mg via ORAL
  Filled 2014-04-08 (×15): qty 1

## 2014-04-08 MED ORDER — SODIUM CHLORIDE 0.9 % IJ SOLN: 9.0000 mL | INTRAMUSCULAR | Status: DC | PRN

## 2014-04-08 MED ORDER — TISSEEL VH 10 ML EX KIT
PACK | CUTANEOUS | Status: AC
  Filled 2014-04-08: qty 2

## 2014-04-08 MED ORDER — LIDOCAINE HCL 1 % IJ SOLN
INTRAMUSCULAR | Status: AC
  Filled 2014-04-08: qty 20

## 2014-04-08 MED ORDER — KETAMINE HCL 10 MG/ML IJ SOLN
INTRAMUSCULAR | Status: AC
  Filled 2014-04-08: qty 1

## 2014-04-08 MED ORDER — MIDAZOLAM HCL 2 MG/2ML IJ SOLN
INTRAMUSCULAR | Status: AC
  Filled 2014-04-08: qty 2

## 2014-04-08 MED ORDER — LATANOPROST 0.005 % OP SOLN
1.0000 [drp] | Freq: Every day | OPHTHALMIC | Status: DC
  Administered 2014-04-08 – 2014-05-03 (×26): 1 [drp] via OPHTHALMIC
  Filled 2014-04-08 (×2): qty 2.5

## 2014-04-08 MED ORDER — ALBUMIN HUMAN 5 % IV SOLN
INTRAVENOUS | Status: AC
  Filled 2014-04-08: qty 250

## 2014-04-08 MED ORDER — FENTANYL CITRATE 0.05 MG/ML IJ SOLN
INTRAMUSCULAR | Status: DC | PRN
  Administered 2014-04-08: 100 ug via INTRAVENOUS
  Administered 2014-04-08 (×2): 50 ug via INTRAVENOUS

## 2014-04-08 MED ORDER — CEFAZOLIN SODIUM-DEXTROSE 2-3 GM-% IV SOLR
2.0000 g | INTRAVENOUS | Status: AC
  Administered 2014-04-08: 2 g via INTRAVENOUS

## 2014-04-08 MED ORDER — PROPOFOL 10 MG/ML IV BOLUS
INTRAVENOUS | Status: DC | PRN
Start: 2014-04-08 — End: 2014-04-08
  Administered 2014-04-08: 50 mg via INTRAVENOUS
  Administered 2014-04-08: 140 mg via INTRAVENOUS

## 2014-04-08 MED ORDER — CEFOTETAN DISODIUM-DEXTROSE 2-2.08 GM-% IV SOLR
INTRAVENOUS | Status: AC
  Filled 2014-04-08: qty 50

## 2014-04-08 MED ORDER — NALOXONE HCL 0.4 MG/ML IJ SOLN: 0.4000 mg | INTRAMUSCULAR | Status: DC | PRN

## 2014-04-08 MED ORDER — MORPHINE SULFATE (PF) 1 MG/ML IV SOLN
INTRAVENOUS | Status: AC
  Filled 2014-04-08: qty 25

## 2014-04-08 MED ORDER — ROCURONIUM BROMIDE 100 MG/10ML IV SOLN
INTRAVENOUS | Status: DC | PRN
  Administered 2014-04-08: 20 mg via INTRAVENOUS
  Administered 2014-04-08: 50 mg via INTRAVENOUS
  Administered 2014-04-08: 5 mg via INTRAVENOUS
  Administered 2014-04-08 (×3): 10 mg via INTRAVENOUS

## 2014-04-08 MED ORDER — BRIMONIDINE TARTRATE-TIMOLOL 0.2-0.5 % OP SOLN: 1.0000 [drp] | Freq: Two times a day (BID) | OPHTHALMIC | Status: DC

## 2014-04-08 MED ORDER — METOCLOPRAMIDE HCL 5 MG/ML IJ SOLN
INTRAMUSCULAR | Status: DC | PRN
  Administered 2014-04-08: 10 mg via INTRAVENOUS

## 2014-04-08 MED ORDER — BRIMONIDINE TARTRATE 0.2 % OP SOLN
1.0000 [drp] | Freq: Two times a day (BID) | OPHTHALMIC | Status: DC
  Administered 2014-04-08 – 2014-05-04 (×51): 1 [drp] via OPHTHALMIC
  Filled 2014-04-08 (×2): qty 5

## 2014-04-08 SURGICAL SUPPLY — 97 items
APPLICATOR DUAL LIQUID (MISCELLANEOUS) ×3 IMPLANT
APPLIER CLIP 5 13 M/L LIGAMAX5 (MISCELLANEOUS)
CABLE HIGH FREQUENCY MONO STRZ (ELECTRODE) IMPLANT
CANNULA REDUC XI 12-8 STAPL (CANNULA) ×1
CANNULA REDUC XI 12-8MM STAPL (CANNULA) ×1
CANNULA REDUCER 12-8 DVNC XI (CANNULA) ×1 IMPLANT
CELLS DAT CNTRL 66122 CELL SVR (MISCELLANEOUS) IMPLANT
CHLORAPREP W/TINT 26ML (MISCELLANEOUS) ×3 IMPLANT
CLIP APPLIE 5 13 M/L LIGAMAX5 (MISCELLANEOUS) IMPLANT
CLIP LIGATING HEM O LOK PURPLE (MISCELLANEOUS) IMPLANT
CLIP LIGATING HEMO O LOK GREEN (MISCELLANEOUS) ×6 IMPLANT
CLIP LIGATING HEMOLOK MED (MISCELLANEOUS) IMPLANT
COVER MAYO STAND STRL (DRAPES) ×3 IMPLANT
COVER TIP SHEARS 8 DVNC (MISCELLANEOUS) ×1 IMPLANT
COVER TIP SHEARS 8MM DA VINCI (MISCELLANEOUS) ×2
DECANTER SPIKE VIAL GLASS SM (MISCELLANEOUS) ×6 IMPLANT
DEVICE TROCAR PUNCTURE CLOSURE (ENDOMECHANICALS) ×3 IMPLANT
DRAIN CHANNEL 19F RND (DRAIN) ×3 IMPLANT
DRAPE ARM DVNC X/XI (DISPOSABLE) ×4 IMPLANT
DRAPE COLUMN DVNC XI (DISPOSABLE) ×1 IMPLANT
DRAPE DA VINCI XI ARM (DISPOSABLE) ×8
DRAPE DA VINCI XI COLUMN (DISPOSABLE) ×2
DRAPE WARM FLUID 44X44 (DRAPE) ×3 IMPLANT
DRSG OPSITE POSTOP 4X6 (GAUZE/BANDAGES/DRESSINGS) ×3 IMPLANT
ELECT PENCIL ROCKER SW 15FT (MISCELLANEOUS) IMPLANT
ELECT REM PT RETURN 15FT ADLT (MISCELLANEOUS) IMPLANT
EVACUATOR SILICONE 100CC (DRAIN) ×6 IMPLANT
GAUZE SPONGE 2X2 8PLY STRL LF (GAUZE/BANDAGES/DRESSINGS) ×1 IMPLANT
GLOVE BIO SURGEON STRL SZ 6 (GLOVE) ×6 IMPLANT
GLOVE INDICATOR 6.5 STRL GRN (GLOVE) ×6 IMPLANT
GOWN STRL REUS W/TWL 2XL LVL3 (GOWN DISPOSABLE) ×12 IMPLANT
GOWN STRL REUS W/TWL XL LVL3 (GOWN DISPOSABLE) ×6 IMPLANT
HEMOSTAT SURGICEL 4X8 (HEMOSTASIS) ×6 IMPLANT
KIT BASIN OR (CUSTOM PROCEDURE TRAY) ×3 IMPLANT
NEEDLE INSUFFLATION 14GA 120MM (NEEDLE) IMPLANT
PACK CARDIOVASCULAR III (CUSTOM PROCEDURE TRAY) ×3 IMPLANT
PENCIL BUTTON HOLSTER BLD 10FT (ELECTRODE) ×3 IMPLANT
PORT LAP GEL ALEXIS MED 5-9CM (MISCELLANEOUS) IMPLANT
POUCH SPECIMEN RETRIEVAL 10MM (ENDOMECHANICALS) IMPLANT
RELOAD STAPLER BLUE 60MM (STAPLE) ×3 IMPLANT
RELOAD STAPLER GOLD 60MM (STAPLE) IMPLANT
RELOAD STAPLER WHITE 60MM (STAPLE) ×1 IMPLANT
RTRCTR WOUND ALEXIS 18CM MED (MISCELLANEOUS)
SCALPEL HARMONIC ACE (MISCELLANEOUS) ×3 IMPLANT
SCISSORS LAP 5X35 DISP (ENDOMECHANICALS) ×3 IMPLANT
SEAL CANN UNIV 5-8 DVNC XI (MISCELLANEOUS) ×4 IMPLANT
SEAL XI 5MM-8MM UNIVERSAL (MISCELLANEOUS) ×8
SEALER VESSEL DA VINCI XI (MISCELLANEOUS) ×2
SEALER VESSEL EXT DVNC XI (MISCELLANEOUS) ×1 IMPLANT
SET IRRIG TUBING LAPAROSCOPIC (IRRIGATION / IRRIGATOR) ×3 IMPLANT
SLEEVE SURGEON STRL (DRAPES) ×3 IMPLANT
SLEEVE XCEL OPT CAN 5 100 (ENDOMECHANICALS) ×3 IMPLANT
SOLUTION ELECTROLUBE (MISCELLANEOUS) ×3 IMPLANT
SPONGE GAUZE 2X2 STER 10/PKG (GAUZE/BANDAGES/DRESSINGS) ×2
SPONGE LAP 18X18 X RAY DECT (DISPOSABLE) ×6 IMPLANT
STAPLE ECHEON FLEX 60 POW ENDO (STAPLE) ×3 IMPLANT
STAPLER 45 BLU RELOAD XI (STAPLE) IMPLANT
STAPLER 45 BLUE RELOAD XI (STAPLE)
STAPLER 45 GREEN RELOAD XI (STAPLE)
STAPLER 45 GRN RELOAD XI (STAPLE) IMPLANT
STAPLER CANNULA SEAL DVNC XI (STAPLE) ×1 IMPLANT
STAPLER CANNULA SEAL XI (STAPLE) ×2
STAPLER ECHELON BIOABSB 60 FLE (MISCELLANEOUS) ×3 IMPLANT
STAPLER RELOAD BLUE 60MM (STAPLE) ×9
STAPLER RELOAD GOLD 60MM (STAPLE)
STAPLER RELOAD WHITE 60MM (STAPLE) ×3
STAPLER SHEATH (SHEATH) ×2
STAPLER SHEATH ENDOWRIST DVNC (SHEATH) ×1 IMPLANT
STAPLER VISISTAT 35W (STAPLE) ×3 IMPLANT
SURGIFLO W/THROMBIN 8M KIT (HEMOSTASIS) ×3 IMPLANT
SUT ETHILON 2 0 PS N (SUTURE) ×3 IMPLANT
SUT MNCRL AB 4-0 PS2 18 (SUTURE) ×3 IMPLANT
SUT PDS AB 1 CTX 36 (SUTURE) ×3 IMPLANT
SUT PDS AB 3-0 SH 27 (SUTURE) IMPLANT
SUT PDS AB 4-0 RB1 27 (SUTURE) IMPLANT
SUT PROLENE 3 0 SH1 36 (SUTURE) IMPLANT
SUT V-LOC BARB 180 2/0GR6 GS22 (SUTURE) ×6
SUT VIC AB 2-0 SH 18 (SUTURE) ×3 IMPLANT
SUT VIC AB 3-0 SH 18 (SUTURE) ×3 IMPLANT
SUT VICRYL 0 TIES 12 18 (SUTURE) IMPLANT
SUT VICRYL 0 UR6 27IN ABS (SUTURE) ×6 IMPLANT
SUT VICRYL 2 0 18  UND BR (SUTURE) ×2
SUT VICRYL 2 0 18 UND BR (SUTURE) ×1 IMPLANT
SUT VICRYL 3 0 BR 18  UND (SUTURE) ×2
SUT VICRYL 3 0 BR 18 UND (SUTURE) ×1 IMPLANT
SUTURE V-LC BRB 180 2/0GR6GS22 (SUTURE) ×2 IMPLANT
SYS LAPSCP GELPORT 120MM (MISCELLANEOUS) ×3
SYSTEM LAPSCP GELPORT 120MM (MISCELLANEOUS) ×1 IMPLANT
TOWEL OR 17X26 10 PK STRL BLUE (TOWEL DISPOSABLE) ×3 IMPLANT
TOWEL OR NON WOVEN STRL DISP B (DISPOSABLE) ×3 IMPLANT
TRAY FOLEY CATH 14FRSI W/METER (CATHETERS) ×3 IMPLANT
TRAY LAP CHOLE (CUSTOM PROCEDURE TRAY) ×3 IMPLANT
TROCAR BLADELESS OPT 5 100 (ENDOMECHANICALS) ×3 IMPLANT
TROCAR XCEL BLUNT TIP 100MML (ENDOMECHANICALS) ×3 IMPLANT
TUBE FEEDING 5FR 36IN KANGAROO (TUBING) IMPLANT
TUBING FILTER THERMOFLATOR (ELECTROSURGICAL) ×3 IMPLANT
YANKAUER SUCT BULB TIP 10FT TU (MISCELLANEOUS) ×3 IMPLANT

## 2014-04-08 NOTE — Anesthesia Procedure Notes (Signed)
Procedure Name: Intubation Date/Time: 04/08/2014 9:56 AM Performed by: Deliah Boston Pre-anesthesia Checklist: Patient identified, Emergency Drugs available, Suction available and Patient being monitored Patient Re-evaluated:Patient Re-evaluated prior to inductionOxygen Delivery Method: Circle System Utilized Preoxygenation: Pre-oxygenation with 100% oxygen Intubation Type: IV induction Ventilation: Mask ventilation without difficulty Laryngoscope Size: Miller and 2 Grade View: Grade I Tube type: Oral Tube size: 7.5 mm Number of attempts: 1 Placement Confirmation: ETT inserted through vocal cords under direct vision,  positive ETCO2 and breath sounds checked- equal and bilateral Secured at: 21 cm Tube secured with: Tape Dental Injury: Teeth and Oropharynx as per pre-operative assessment

## 2014-04-08 NOTE — Transfer of Care (Signed)
Immediate Anesthesia Transfer of Care Note  Patient: [name]  Procedure(s) Performed: Procedure(s): XI ROBOTIC ASSISTED CONVERTED LAPAROSCOPIC HAND ASSITED DISTAL PANCREATECTOMY/SPLENECTOMY WITH PARTIAL COLECTOMY (N/A)  Patient Location: PACU  Anesthesia Type:General  Level of Consciousness: Patient easily awoken, sedated, comfortable, cooperative, following commands, responds to stimulation.   Airway & Oxygen Therapy: Patient spontaneously breathing, ventilating well, oxygen via simple oxygen mask.  Post-op Assessment: Report given to PACU RN, vital signs reviewed and stable, moving all extremities.   Post vital signs: Reviewed and stable.  Complications: No apparent anesthesia complications

## 2014-04-08 NOTE — Anesthesia Preprocedure Evaluation (Addendum)
Anesthesia Evaluation  Patient identified by MRN, date of birth, ID band Patient awake    Reviewed: Allergy & Precautions, NPO status , Patient's Chart, lab work & pertinent test results  Airway Mallampati: II  TM Distance: >3 FB Neck ROM: Full    Dental no notable dental hx.    Pulmonary Current Smoker, PE breath sounds clear to auscultation  Pulmonary exam normal       Cardiovascular hypertension, Pt. on medications DVT Rhythm:Regular Rate:Normal     Neuro/Psych negative neurological ROS  negative psych ROS   GI/Hepatic negative GI ROS, Neg liver ROS,   Endo/Other  negative endocrine ROS  Renal/GU negative Renal ROS  negative genitourinary   Musculoskeletal negative musculoskeletal ROS (+)   Abdominal   Peds negative pediatric ROS (+)  Hematology negative hematology ROS (+)   Anesthesia Other Findings   Reproductive/Obstetrics negative OB ROS                             Anesthesia Physical  Anesthesia Plan  ASA: III  Anesthesia Plan: MAC   Post-op Pain Management:    Induction: Intravenous  Airway Management Planned: Oral ETT  Additional Equipment:   Intra-op Plan:   Post-operative Plan: Extubation in OR  Informed Consent: I have reviewed the patients History and Physical, chart, labs and discussed the procedure including the risks, benefits and alternatives for the proposed anesthesia with the patient or authorized representative who has indicated his/her understanding and acceptance.   Dental advisory given  Plan Discussed with: CRNA  Anesthesia Plan Comments:        Anesthesia Quick Evaluation

## 2014-04-08 NOTE — Anesthesia Postprocedure Evaluation (Signed)
  Anesthesia Post-op Note  Patient: [name]  Procedure(s) Performed: Procedure(s) (LRB): XI ROBOTIC ASSISTED CONVERTED LAPAROSCOPIC HAND ASSITED DISTAL PANCREATECTOMY/SPLENECTOMY WITH PARTIAL COLECTOMY (N/A)  Patient Location: PACU  Anesthesia Type: General  Level of Consciousness: awake and alert   Airway and Oxygen Therapy: Patient Spontanous Breathing  Post-op Pain: mild  Post-op Assessment: Post-op Vital signs reviewed, Patient's Cardiovascular Status Stable, Respiratory Function Stable, Patent Airway and No signs of Nausea or vomiting  Last Vitals:  Filed Vitals:   04/08/14 1400  BP: 124/62  Pulse: 63  Temp:   Resp: 12    Post-op Vital Signs: stable   Complications: No apparent anesthesia complications

## 2014-04-08 NOTE — Op Note (Signed)
PREOPERATIVE DIAGNOSIS:  cT2 N0 M0 pancreatic cancer.      POSTOPERATIVE DIAGNOSIS:  cT3N2M0 pancreatic cancer     PROCEDURE:  Robotic converted to laparoscopic hand-assisted distal pancreatectomy and splenectomy, partial colectomy     SURGEON:  [name], MD      ASSISTANT:  Leighton Ruff, M.D.      ANESTHESIA:  General and local.      FINDINGS:  Mass adherent to splenic hilum, anterior left kidney, and transverse colon.      SPECIMEN:  Distal pancreas and spleen to Pathology, portion of transverse colon.      ESTIMATED BLOOD LOSS:  175 mL      COMPLICATIONS:  None known.      PROCEDURE:  Patient was identified in the holding area and taken to   operating room where she was placed supine on the operating room table.   General anesthesia was induced.  Foley catheter was placed.  The left side was slightly bumped up.  Her abdomen was prepped and   draped in sterile fashion.  Time-out was performed according to surgical   safety check list.  When all was correct, we continued.    Local anesthetic was infiltrated at the left costal margin. The patient was placed into reverse Trendelenburg position and rotated to the right. The Veress needle was advanced through the abdominal wall. Once it was in good position pneumoperitoneum was achieved to a pressure of 15 mmHg.   Local anesthetic was infiltrated in the infraumbilical location at the prior infraumbilical incision.   Additional robotic ports were placed with a 12 mm in the left lower quadrant, an 8 mm port in the left lower quadrant more laterally, and a 8 mm port in the right upper quadrant.  Two 5 mm assistant ports were placed in the right lower quadrant. The robot was docked to the infraumbilical 8 mm port. Target anatomy was performed. The robotic arms were then all attached to the ports. The instruments were advanced into the abdomen under direct visualization. The fenestrated grasper, the tip up fenestrated grasper, and the vessel  sealer.   The surgeon then went to the console. The omentum was taken down off of the greater curve of the stomach. The short gastrics were divided with a vessel sealer. The spleen was attempted to be retracted laterally. This did not want to move very much. Attention was then directed to the lienocolic ligament. This was divided with the vessel sealer. The colon was identified and dissected out. The lateral attachments of the spleen were taken down. The area underneath the pancreas was started to be divided. However, there was pesky bleeding behind the stomach. There were several small short gastrics that were bleeding and these were coagulated with the bipolar. There was one that required suture.   The decision was made to convert to hand port and undocked the robot once it was ascertained that the pancreatic tumor was adherent to the colon. This was not going to come out safely with the robot in place. The robot was undocked and a midline 7 cm incision was placed just below the xiphoid process. The ubcutaneous tissues were divided with cautery. The fascia was entered in the midline with the cautery. The GelPort was placed in the abdomen. The surgeon hand was advanced and the abdomen and it was apparent that the mass was densely adherent to the retroperitoneum. This was isolated as best as possible from surrounding structures until it was just adherent in the  posterior aspect. The pancreas was divided from the splenic artery and vein. The pancreas was divided with the echelon 60 mm blue load with the scene guard. The splenic vessels were divided with the white load. This allowed more retraction anteriorly. The staple line of the pancreas was not seen to be bleeding. The blunt dissection was used with part of the retroperitoneum. The scissors were used to take the tumor from the colon.  This did appear to come off easily from the colon with the scissors, however it became apparent that there was still tumor on  the transverse colon.  A decision was made to divide this portion of the colon in order not to leave tumor behind. The retroperitoneum was still oozing, and I noted that a portion of the renal capsule had come off with the tumor. Pressure was held on the surface of the kidney.   Urology was asked to come look at the kidney. We were going to do a bolster however with additional pressure the area stopped bleeding. FloSeal and Surgicel were placed on top of the capsular defect. The echelon stapler was used to divide the colon proximally and distally to the area of tumor involvement. The specimen was then able to be disconnected to the rest of the retroperitoneum. This was taken out through the hand port. The kidney was reevaluated and the bleeding was still stopped. The pancreatic margin was also evaluated in this was intact and not bleeding. The 2 ends of the colon were then anastomosed via the hand port. A side-to-side functional end-to-end anastomosis was created. A another load of the echelon stapler was used to connect the 2 ends of the colon. The defect was closed with a V lock suture. The crotch of the anastomosis was reinforced with 4 2-0 vicryl sutures. The anastomosis was placed in the left lower quadrant. The pneumoperitoneum was reachieved. The left upper quadrant was examined and there was no additional sign of bleeding. A 19 [place] Blake drain was placed in this location. This was taken out of the most lateral port. An Endo Close was used to close the 12 mm port. The skin of the remaining incisions was closed with staples. The midline hand port was closed using #1 looped PDS suture and staples. The wounds were cleaned, dried, and dressed with dry sterile dressings. The patient was allowed to emerge from anesthesia and was taken to the PACU in stable condition. Needle, sponge, and instrument counts were correct 2.           [name], MD

## 2014-04-08 NOTE — Interval H&P Note (Signed)
History and Physical Interval Note:  04/08/2014 7:29 AM  [name]  has presented today for surgery, with the diagnosis of adenocarcinoma of pancreatic tail  The various methods of treatment have been discussed with the patient and family. After consideration of risks, benefits and other options for treatment, the patient has consented to  Procedure(s): XI ROBOTIC ASSISTED LAPAROSCOPIC DISTAL PANCREATECTOMY/SPLENECTOMY (N/A) as a surgical intervention .  The patient's history has been reviewed, patient examined, no change in status, stable for surgery.  I have reviewed the patient's chart and labs.  Questions were answered to the patient's satisfaction.     Carnella Fryman

## 2014-04-09 ENCOUNTER — Encounter (HOSPITAL_COMMUNITY): Payer: Self-pay | Admitting: General Surgery

## 2014-04-09 LAB — CBC
HEMATOCRIT: 32.5 % — AB (ref 36.0–46.0)
Hemoglobin: 10.5 g/dL — ABNORMAL LOW (ref 12.0–15.0)
MCH: 30 pg (ref 26.0–34.0)
MCHC: 32.3 g/dL (ref 30.0–36.0)
MCV: 92.9 fL (ref 78.0–100.0)
Platelets: 167 10*3/uL (ref 150–400)
RBC: 3.5 MIL/uL — AB (ref 3.87–5.11)
RDW: 13.2 % (ref 11.5–15.5)
WBC: 12 10*3/uL — ABNORMAL HIGH (ref 4.0–10.5)

## 2014-04-09 LAB — PHOSPHORUS: PHOSPHORUS: 4 mg/dL (ref 2.3–4.6)

## 2014-04-09 LAB — PROTIME-INR
INR: 1.12 (ref 0.00–1.49)
Prothrombin Time: 14.6 seconds (ref 11.6–15.2)

## 2014-04-09 LAB — MAGNESIUM: MAGNESIUM: 1.3 mg/dL — AB (ref 1.5–2.5)

## 2014-04-09 LAB — BASIC METABOLIC PANEL
Anion gap: 7 (ref 5–15)
BUN: 7 mg/dL (ref 6–23)
CO2: 27 mmol/L (ref 19–32)
Calcium: 8.4 mg/dL (ref 8.4–10.5)
Chloride: 104 mmol/L (ref 96–112)
Creatinine, Ser: 0.74 mg/dL (ref 0.50–1.10)
GFR calc Af Amer: >90 mL/min (ref >90)
GFR calc non Af Amer: 86 mL/min — ABNORMAL LOW (ref >90)
GLUCOSE: 212 mg/dL — AB (ref 70–99)
Potassium: 4.1 mmol/L (ref 3.5–5.1)
SODIUM: 138 mmol/L (ref 135–145)

## 2014-04-09 LAB — APTT: aPTT: 33 seconds (ref 24–37)

## 2014-04-09 MED ORDER — CETYLPYRIDINIUM CHLORIDE 0.05 % MT LIQD
7.0000 mL | Freq: Two times a day (BID) | OROMUCOSAL | Status: DC
  Administered 2014-04-10 – 2014-04-20 (×14): 7 mL via OROMUCOSAL

## 2014-04-09 MED ORDER — ACETAMINOPHEN 500 MG PO TABS
500.0000 mg | ORAL_TABLET | Freq: Four times a day (QID) | ORAL | Status: DC | PRN
  Administered 2014-04-14: 500 mg via ORAL
  Filled 2014-04-09: qty 1

## 2014-04-09 MED ORDER — CHLORHEXIDINE GLUCONATE 0.12 % MT SOLN
15.0000 mL | Freq: Two times a day (BID) | OROMUCOSAL | Status: DC
  Administered 2014-04-10 – 2014-04-21 (×20): 15 mL via OROMUCOSAL
  Filled 2014-04-09 (×24): qty 15

## 2014-04-09 MED ORDER — ACETAMINOPHEN 10 MG/ML IV SOLN
1000.0000 mg | Freq: Four times a day (QID) | INTRAVENOUS | Status: AC
  Administered 2014-04-09 – 2014-04-10 (×4): 1000 mg via INTRAVENOUS
  Filled 2014-04-09 (×4): qty 100

## 2014-04-09 NOTE — Progress Notes (Signed)
1 Day Post-Op  Subjective: Patient is having pain, but not using PCA much.  She drifts off to sleep and is alarming the apnea alarm intermittently.    Objective: Vital signs in last 24 hours: Temp:  [97.6 F (36.4 C)-98.5 F (36.9 C)] 97.7 F (36.5 C) (03/11 1217) Pulse Rate:  [73-84] 84 (03/11 1217) Resp:  [12-26] 14 (03/11 1217) BP: (93-177)/(38-78) 106/51 mmHg (03/11 1217) SpO2:  [99 %-100 %] 100 % (03/11 1217) Weight:  [207 lb 14.3 oz (94.3 kg)] 207 lb 14.3 oz (94.3 kg) (03/10 1808)    Intake/Output from previous day: 03/10 0701 - 03/11 0700 In: 5901.7 [I.V.:5521.7; NG/GT:30; IV Piggyback:350] Out: 7793 [Urine:3270; Emesis/NG output:350; Drains:660; Blood:1150] Intake/Output this shift: Total I/O In: 600 [I.V.:600] Out: 405 [Urine:380; Drains:25]  General appearance: alert, cooperative and mild distress Resp: breathing comfortably Cardio: regular rate and rhythm GI: soft, sl distended, dressing c/d/i.    Drain serosang.     Lab Results:   Recent Labs  04/08/14 1330 04/09/14 0404  WBC 8.8 12.0*  HGB 10.3* 10.5*  HCT 31.8* 32.5*  PLT 140* 167   BMET  Recent Labs  04/09/14 0404  NA 138  K 4.1  CL 104  CO2 27  GLUCOSE 212*  BUN 7  CREATININE 0.74  CALCIUM 8.4   PT/INR  Recent Labs  04/08/14 0630 04/09/14 0404  LABPROT 13.9 14.6  INR 1.06 1.12   ABG No results for input(s): PHART, HCO3 in the last 72 hours.  Invalid input(s): PCO2, PO2  Studies/Results: No results found.  Anti-infectives: Anti-infectives    Start     Dose/Rate Route Frequency Ordered Stop   04/08/14 1600  cefoTEtan (CEFOTAN) 2 g in dextrose 5 % 50 mL IVPB     2 g 100 mL/hr over 30 Minutes Intravenous Every 12 hours 04/08/14 1522 04/09/14 0530   04/08/14 0537  ceFAZolin (ANCEF) IVPB 2 g/50 mL premix     2 g 100 mL/hr over 30 Minutes Intravenous On call to O.R. 04/08/14 0537 04/08/14 0759      Assessment/Plan: s/p Procedure(s): XI ROBOTIC ASSISTED CONVERTED  LAPAROSCOPIC HAND ASSITED DISTAL PANCREATECTOMY/SPLENECTOMY WITH PARTIAL COLECTOMY (N/A) Continue foley due to strict I&O, patient in ICU and urinary output monitoring Leave in stepdown for pain control and apnea issues.    Leaving on bedrest today.   Will recheck CBC in AM.  If HCT stable, will let off bedrest and restart lovenox from home dose.      LOS: 1 day    [employer] 04/09/2014

## 2014-04-09 NOTE — Progress Notes (Signed)
CARE MANAGEMENT NOTE 04/09/2014  Patient:  [name], [name]   Account Number:  [id number]  Date Initiated:  04/09/2014  Documentation initiated by:  DAVIS,RHONDA  Subjective/Objective Assessment:   gastric surgery hypotensive post op     Action/Plan:   home when stable   Anticipated DC Date:  04/12/2014   Anticipated DC Plan:  [place]  CM consult      PAC Choice  NA   Choice offered to / List presented to:  NA           Status of service:  In process, will continue to follow Medicare Important Message given?   (If response is "NO", the following Medicare IM given date fields will be blank) Date Medicare IM given:   Medicare IM given by:   Date Additional Medicare IM given:   Additional Medicare IM given by:    Discharge Disposition:    Per UR Regulation:  Reviewed for med. necessity/level of care/duration of stay  If discussed at [place] of Stay Meetings, dates discussed:    Comments:  April 09, 2014/Rhonda L. Rosana Hoes, RN, BSN, CCM. Case Management [place] [phone] No discharge needs present of time of review.

## 2014-04-10 LAB — BASIC METABOLIC PANEL
Anion gap: 6 (ref 5–15)
BUN: 7 mg/dL (ref 6–23)
CHLORIDE: 104 mmol/L (ref 96–112)
CO2: 25 mmol/L (ref 19–32)
Calcium: 7.9 mg/dL — ABNORMAL LOW (ref 8.4–10.5)
Creatinine, Ser: 0.66 mg/dL (ref 0.50–1.10)
GFR, EST NON AFRICAN AMERICAN: 89 mL/min — AB (ref >90)
GLUCOSE: 169 mg/dL — AB (ref 70–99)
Potassium: 3.6 mmol/L (ref 3.5–5.1)
Sodium: 135 mmol/L (ref 135–145)

## 2014-04-10 LAB — CBC
HCT: 27.7 % — ABNORMAL LOW (ref 36.0–46.0)
Hemoglobin: 9 g/dL — ABNORMAL LOW (ref 12.0–15.0)
MCH: 30 pg (ref 26.0–34.0)
MCHC: 32.5 g/dL (ref 30.0–36.0)
MCV: 92.3 fL (ref 78.0–100.0)
Platelets: 167 10*3/uL (ref 150–400)
RBC: 3 MIL/uL — ABNORMAL LOW (ref 3.87–5.11)
RDW: 13.5 % (ref 11.5–15.5)
WBC: 15.4 10*3/uL — AB (ref 4.0–10.5)

## 2014-04-10 MED ORDER — PANTOPRAZOLE SODIUM 40 MG IV SOLR
40.0000 mg | Freq: Two times a day (BID) | INTRAVENOUS | Status: DC
  Administered 2014-04-10 – 2014-04-20 (×22): 40 mg via INTRAVENOUS
  Filled 2014-04-10 (×24): qty 40

## 2014-04-10 NOTE — Progress Notes (Signed)
Patient ID: [name], female   DOB: Apr 14, 1945, 69 y.o.   MRN: [id number] [facility] Surgery Progress Note:   2 Days Post-Op  Subjective: Mental status is alert;  Answers questions appropriately.   Objective: Vital signs in last 24 hours: Temp:  [97.7 F (36.5 C)-98.6 F (37 C)] 98.2 F (36.8 C) (03/12 0400) Pulse Rate:  [79-100] 95 (03/12 0700) Resp:  [9-18] 15 (03/12 0829) BP: (93-139)/(38-60) 115/48 mmHg (03/12 0700) SpO2:  [99 %-100 %] 100 % (03/12 0829)  Intake/Output from previous day: 03/11 0701 - 03/12 0700 In: 2165 [I.V.:1775; NG/GT:90; IV Piggyback:300] Out: 2080 [Urine:1305; Emesis/NG output:700; Drains:75] Intake/Output this shift: Total I/O In: 100 [I.V.:100] Out: 10 [Drains:10]  Physical Exam: Work of breathing is not labored.  Incisions covered.  JP is serosanguinous.  NG is coffee grounds  Lab Results:  Results for orders placed or performed during the hospital encounter of 04/08/14 (from the past 48 hour(s))  CBC     Status: Abnormal   Collection Time: 04/08/14  1:30 PM  Result Value Ref Range   WBC 8.8 4.0 - 10.5 K/uL   RBC 3.38 (L) 3.87 - 5.11 MIL/uL   Hemoglobin 10.3 (L) 12.0 - 15.0 g/dL   HCT 31.8 (L) 36.0 - 46.0 %   MCV 94.1 78.0 - 100.0 fL   MCH 30.5 26.0 - 34.0 pg   MCHC 32.4 30.0 - 36.0 g/dL   RDW 13.3 11.5 - 15.5 %   Platelets 140 (L) 150 - 400 K/uL  Glucose, capillary     Status: Abnormal   Collection Time: 04/08/14  4:03 PM  Result Value Ref Range   Glucose-Capillary 148 (H) 70 - 99 mg/dL  CBC     Status: Abnormal   Collection Time: 04/09/14  4:04 AM  Result Value Ref Range   WBC 12.0 (H) 4.0 - 10.5 K/uL   RBC 3.50 (L) 3.87 - 5.11 MIL/uL   Hemoglobin 10.5 (L) 12.0 - 15.0 g/dL   HCT 32.5 (L) 36.0 - 46.0 %   MCV 92.9 78.0 - 100.0 fL   MCH 30.0 26.0 - 34.0 pg   MCHC 32.3 30.0 - 36.0 g/dL   RDW 13.2 11.5 - 15.5 %   Platelets 167 150 - 400 K/uL  Basic metabolic panel     Status: Abnormal   Collection Time: 04/09/14  4:04 AM   Result Value Ref Range   Sodium 138 135 - 145 mmol/L   Potassium 4.1 3.5 - 5.1 mmol/L   Chloride 104 96 - 112 mmol/L   CO2 27 19 - 32 mmol/L   Glucose, Bld 212 (H) 70 - 99 mg/dL   BUN 7 6 - 23 mg/dL   Creatinine, Ser 0.74 0.50 - 1.10 mg/dL   Calcium 8.4 8.4 - 10.5 mg/dL   GFR calc non Af Amer 86 (L) >90 mL/min   GFR calc Af Amer >90 >90 mL/min    Comment: (NOTE) The eGFR has been calculated using the CKD EPI equation. This calculation has not been validated in all clinical situations. eGFR's persistently <90 mL/min signify possible Chronic Kidney Disease.    Anion gap 7 5 - 15  Magnesium     Status: Abnormal   Collection Time: 04/09/14  4:04 AM  Result Value Ref Range   Magnesium 1.3 (L) 1.5 - 2.5 mg/dL  Phosphorus     Status: None   Collection Time: 04/09/14  4:04 AM  Result Value Ref Range   Phosphorus 4.0 2.3 - 4.6  mg/dL  Protime-INR     Status: None   Collection Time: 04/09/14  4:04 AM  Result Value Ref Range   Prothrombin Time 14.6 11.6 - 15.2 seconds   INR 1.12 0.00 - 1.49  APTT     Status: None   Collection Time: 04/09/14  4:04 AM  Result Value Ref Range   aPTT 33 24 - 37 seconds  CBC     Status: Abnormal   Collection Time: 04/10/14  4:12 AM  Result Value Ref Range   WBC 15.4 (H) 4.0 - 10.5 K/uL   RBC 3.00 (L) 3.87 - 5.11 MIL/uL   Hemoglobin 9.0 (L) 12.0 - 15.0 g/dL   HCT 27.7 (L) 36.0 - 46.0 %   MCV 92.3 78.0 - 100.0 fL   MCH 30.0 26.0 - 34.0 pg   MCHC 32.5 30.0 - 36.0 g/dL   RDW 13.5 11.5 - 15.5 %   Platelets 167 150 - 400 K/uL  Basic metabolic panel     Status: Abnormal   Collection Time: 04/10/14  4:12 AM  Result Value Ref Range   Sodium 135 135 - 145 mmol/L   Potassium 3.6 3.5 - 5.1 mmol/L   Chloride 104 96 - 112 mmol/L   CO2 25 19 - 32 mmol/L   Glucose, Bld 169 (H) 70 - 99 mg/dL   BUN 7 6 - 23 mg/dL   Creatinine, Ser 0.66 0.50 - 1.10 mg/dL   Calcium 7.9 (L) 8.4 - 10.5 mg/dL   GFR calc non Af Amer 89 (L) >90 mL/min   GFR calc Af Amer >90 >90  mL/min    Comment: (NOTE) The eGFR has been calculated using the CKD EPI equation. This calculation has not been validated in all clinical situations. eGFR's persistently <90 mL/min signify possible Chronic Kidney Disease.    Anion gap 6 5 - 15    Radiology/Results: No results found.  Anti-infectives: Anti-infectives    Start     Dose/Rate Route Frequency Ordered Stop   04/08/14 1600  cefoTEtan (CEFOTAN) 2 g in dextrose 5 % 50 mL IVPB     2 g 100 mL/hr over 30 Minutes Intravenous Every 12 hours 04/08/14 1522 04/09/14 0530   04/08/14 0537  ceFAZolin (ANCEF) IVPB 2 g/50 mL premix     2 g 100 mL/hr over 30 Minutes Intravenous On call to O.R. 04/08/14 0537 04/08/14 0759      Assessment/Plan: Problem List: Patient Active Problem List   Diagnosis Date Noted  . 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 Pancreatic adenocarcinoma 03/22/2014  . Rash of back 03/19/2014  . Family history of malignant neoplasm of breast 02/26/2014  . Family history of prostate cancer 02/26/2014  . DVT (deep venous thrombosis) 01/11/2014  . Pancreatic mass on CT 11/15 12/25/2013  . Diverticular disease 12/22/2013  . History of DVT (deep vein thrombosis) 03/01/2011  . Hx pulmonary embolism 03/01/2011  . HTN (hypertension) 03/01/2011  . Hypercholesterolemia 03/01/2011  . Reflux 03/01/2011    HG drift down and coffee grounds.  Protonix ordered.  Will try to get up and mobilize today.   2 Days Post-Op    LOS: 2 days   Matt B. Hassell Done, MD, [facility] [phone] beeper [phone]  04/10/2014 8:33 AM

## 2014-04-11 LAB — CBC
HEMATOCRIT: 25.1 % — AB (ref 36.0–46.0)
HEMOGLOBIN: 8.2 g/dL — AB (ref 12.0–15.0)
MCH: 30.3 pg (ref 26.0–34.0)
MCHC: 32.7 g/dL (ref 30.0–36.0)
MCV: 92.6 fL (ref 78.0–100.0)
Platelets: 178 10*3/uL (ref 150–400)
RBC: 2.71 MIL/uL — AB (ref 3.87–5.11)
RDW: 13.6 % (ref 11.5–15.5)
WBC: 17.1 10*3/uL — AB (ref 4.0–10.5)

## 2014-04-11 LAB — BASIC METABOLIC PANEL
Anion gap: 5 (ref 5–15)
BUN: <5 mg/dL — ABNORMAL LOW (ref 6–23)
CALCIUM: 7.8 mg/dL — AB (ref 8.4–10.5)
CO2: 28 mmol/L (ref 19–32)
CREATININE: 0.49 mg/dL — AB (ref 0.50–1.10)
Chloride: 102 mmol/L (ref 96–112)
GLUCOSE: 167 mg/dL — AB (ref 70–99)
POTASSIUM: 3.4 mmol/L — AB (ref 3.5–5.1)
Sodium: 135 mmol/L (ref 135–145)

## 2014-04-11 MED ORDER — LACTATED RINGERS IV BOLUS (SEPSIS)
1000.0000 mL | Freq: Once | INTRAVENOUS | Status: AC
  Administered 2014-04-11: 1000 mL via INTRAVENOUS

## 2014-04-11 MED ORDER — LIP MEDEX EX OINT
TOPICAL_OINTMENT | CUTANEOUS | Status: AC
  Filled 2014-04-11: qty 7

## 2014-04-11 MED ORDER — LIP MEDEX EX OINT
TOPICAL_OINTMENT | CUTANEOUS | Status: DC | PRN
  Administered 2014-04-11: 09:00:00 via TOPICAL

## 2014-04-11 NOTE — Progress Notes (Signed)
Patient ID: [name], female   DOB: 1945-10-06, 69 y.o.   MRN: [id number] [facility] Surgery Progress Note:   3 Days Post-Op  Subjective: Mental status is clear.  No complaints.  Sat up in chair last night.   Objective: Vital signs in last 24 hours: Temp:  [98.5 F (36.9 C)-98.7 F (37.1 C)] 98.7 F (37.1 C) (03/13 0800) Pulse Rate:  [88-122] 110 (03/13 0700) Resp:  [8-20] 17 (03/13 0800) BP: (96-135)/(41-110) 120/56 mmHg (03/13 0700) SpO2:  [95 %-100 %] 97 % (03/13 0800)  Intake/Output from previous day: 03/12 0701 - 03/13 0700 In: 2145.5 [I.V.:2120.5; NG/GT:25] Out: 1228 [Urine:740; Emesis/NG output:450; Drains:38] Intake/Output this shift: Total I/O In: 325 [I.V.:300; NG/GT:25] Out: 695 [Urine:575; Emesis/NG output:110; Drains:10]  Physical Exam: Work of breathing is not labored.  NG in place (will remove);  Incisions covered.  JP serosanguinous.  Foley in place.    Lab Results:  Results for orders placed or performed during the hospital encounter of 04/08/14 (from the past 48 hour(s))  CBC     Status: Abnormal   Collection Time: 04/10/14  4:12 AM  Result Value Ref Range   WBC 15.4 (H) 4.0 - 10.5 K/uL   RBC 3.00 (L) 3.87 - 5.11 MIL/uL   Hemoglobin 9.0 (L) 12.0 - 15.0 g/dL   HCT 27.7 (L) 36.0 - 46.0 %   MCV 92.3 78.0 - 100.0 fL   MCH 30.0 26.0 - 34.0 pg   MCHC 32.5 30.0 - 36.0 g/dL   RDW 13.5 11.5 - 15.5 %   Platelets 167 150 - 400 K/uL  Basic metabolic panel     Status: Abnormal   Collection Time: 04/10/14  4:12 AM  Result Value Ref Range   Sodium 135 135 - 145 mmol/L   Potassium 3.6 3.5 - 5.1 mmol/L   Chloride 104 96 - 112 mmol/L   CO2 25 19 - 32 mmol/L   Glucose, Bld 169 (H) 70 - 99 mg/dL   BUN 7 6 - 23 mg/dL   Creatinine, Ser 0.66 0.50 - 1.10 mg/dL   Calcium 7.9 (L) 8.4 - 10.5 mg/dL   GFR calc non Af Amer 89 (L) >90 mL/min   GFR calc Af Amer >90 >90 mL/min    Comment: (NOTE) The eGFR has been calculated using the CKD EPI equation. This  calculation has not been validated in all clinical situations. eGFR's persistently <90 mL/min signify possible Chronic Kidney Disease.    Anion gap 6 5 - 15  CBC     Status: Abnormal   Collection Time: 04/11/14  6:59 AM  Result Value Ref Range   WBC 17.1 (H) 4.0 - 10.5 K/uL   RBC 2.71 (L) 3.87 - 5.11 MIL/uL   Hemoglobin 8.2 (L) 12.0 - 15.0 g/dL   HCT 25.1 (L) 36.0 - 46.0 %   MCV 92.6 78.0 - 100.0 fL   MCH 30.3 26.0 - 34.0 pg   MCHC 32.7 30.0 - 36.0 g/dL   RDW 13.6 11.5 - 15.5 %   Platelets 178 150 - 400 K/uL  Basic metabolic panel     Status: Abnormal   Collection Time: 04/11/14  6:59 AM  Result Value Ref Range   Sodium 135 135 - 145 mmol/L   Potassium 3.4 (L) 3.5 - 5.1 mmol/L   Chloride 102 96 - 112 mmol/L   CO2 28 19 - 32 mmol/L   Glucose, Bld 167 (H) 70 - 99 mg/dL   BUN <5 (L) 6 - 23  mg/dL   Creatinine, Ser 0.49 (L) 0.50 - 1.10 mg/dL   Calcium 7.8 (L) 8.4 - 10.5 mg/dL   GFR calc non Af Amer >90 >90 mL/min   GFR calc Af Amer >90 >90 mL/min    Comment: (NOTE) The eGFR has been calculated using the CKD EPI equation. This calculation has not been validated in all clinical situations. eGFR's persistently <90 mL/min signify possible Chronic Kidney Disease.    Anion gap 5 5 - 15    Radiology/Results: No results found.  Anti-infectives: Anti-infectives    Start     Dose/Rate Route Frequency Ordered Stop   04/08/14 1600  cefoTEtan (CEFOTAN) 2 g in dextrose 5 % 50 mL IVPB     2 g 100 mL/hr over 30 Minutes Intravenous Every 12 hours 04/08/14 1522 04/09/14 0530   04/08/14 0537  ceFAZolin (ANCEF) IVPB 2 g/50 mL premix     2 g 100 mL/hr over 30 Minutes Intravenous On call to O.R. 04/08/14 0537 04/08/14 0759      Assessment/Plan: Problem List: Patient Active Problem List   Diagnosis Date Noted  . 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 Pancreatic adenocarcinoma 03/22/2014  . Rash of back 03/19/2014  . Family history of malignant neoplasm of breast 02/26/2014  .  Family history of prostate cancer 02/26/2014  . DVT (deep venous thrombosis) 01/11/2014  . Pancreatic mass on CT 11/15 12/25/2013  . Diverticular disease 12/22/2013  . History of DVT (deep vein thrombosis) 03/01/2011  . Hx pulmonary embolism 03/01/2011  . HTN (hypertension) 03/01/2011  . Hypercholesterolemia 03/01/2011  . Reflux 03/01/2011    WBC up;  Hg drifted down.  Will discontinue NG and Foley and continue observation in stepdown.   3 Days Post-Op    LOS: 3 days   Matt B. Hassell Done, MD, [facility] [phone] beeper [phone]  04/11/2014 9:05 AM

## 2014-04-12 LAB — BASIC METABOLIC PANEL
ANION GAP: 7 (ref 5–15)
BUN: <5 mg/dL — ABNORMAL LOW (ref 6–23)
CALCIUM: 7.8 mg/dL — AB (ref 8.4–10.5)
CO2: 27 mmol/L (ref 19–32)
Chloride: 103 mmol/L (ref 96–112)
Creatinine, Ser: 0.62 mg/dL (ref 0.50–1.10)
GFR calc non Af Amer: >90 mL/min (ref >90)
Glucose, Bld: 143 mg/dL — ABNORMAL HIGH (ref 70–99)
Potassium: 3.6 mmol/L (ref 3.5–5.1)
Sodium: 137 mmol/L (ref 135–145)

## 2014-04-12 LAB — CBC
HCT: 25 % — ABNORMAL LOW (ref 36.0–46.0)
Hemoglobin: 8.1 g/dL — ABNORMAL LOW (ref 12.0–15.0)
MCH: 30 pg (ref 26.0–34.0)
MCHC: 32.4 g/dL (ref 30.0–36.0)
MCV: 92.6 fL (ref 78.0–100.0)
PLATELETS: 219 10*3/uL (ref 150–400)
RBC: 2.7 MIL/uL — ABNORMAL LOW (ref 3.87–5.11)
RDW: 13.5 % (ref 11.5–15.5)
WBC: 16 10*3/uL — ABNORMAL HIGH (ref 4.0–10.5)

## 2014-04-12 MED ORDER — ALBUMIN HUMAN 25 % IV SOLN
25.0000 g | Freq: Four times a day (QID) | INTRAVENOUS | Status: AC
  Administered 2014-04-12 – 2014-04-13 (×3): 25 g via INTRAVENOUS
  Filled 2014-04-12 (×2): qty 50
  Filled 2014-04-12: qty 100
  Filled 2014-04-12: qty 50
  Filled 2014-04-12: qty 100

## 2014-04-12 NOTE — Progress Notes (Signed)
Pt is running low grade fever, using IS only with much encouragement.  Slow to move, still unable to void.  Foley d/c 3/13 and pt unable to void on own.  Have I&O cathed twice.  If still can not void later this morning will need foley placed.  Pt using accessory muscles to breathe, increase in edema noted, increase in wt.

## 2014-04-12 NOTE — Progress Notes (Signed)
CARE MANAGEMENT NOTE 04/12/2014  Patient:  [name], [name]   Account Number:  [id number]  Date Initiated:  04/09/2014  Documentation initiated by:  DAVIS,RHONDA  Subjective/Objective Assessment:   gastric surgery hypotensive post op     Action/Plan:   home when stable   Anticipated DC Date:  04/15/2014   Anticipated DC Plan:  [place]  CM consult      PAC Choice  NA   Choice offered to / List presented to:  NA           Status of service:  In process, will continue to follow Medicare Important Message given?   (If response is "NO", the following Medicare IM given date fields will be blank) Date Medicare IM given:   Medicare IM given by:   Date Additional Medicare IM given:   Additional Medicare IM given by:    Discharge Disposition:    Per UR Regulation:  Reviewed for med. necessity/level of care/duration of stay  If discussed at [place] of Stay Meetings, dates discussed:    Comments:  April 12, 2014/Rhonda L. Rosana Hoes, RN, BSN, CCM. Case Management [place] [phone] No discharge needs present of time of review. POD 3 hypotensive and recieiving albumin units x2.  April 09, 2014/Rhonda L. Rosana Hoes, RN, BSN, CCM. Case Management [place] [phone] No discharge needs present of time of review.

## 2014-04-12 NOTE — Progress Notes (Signed)
Patient ID: [name], female   DOB: 1945/08/25, 69 y.o.   MRN: [id number] [facility] Surgery Progress Note:   4 Days Post-Op   Subjective: Had drop in HCT yesterday but this am it is stable. Also had an episode of hypotension with SBP in 80s.  Foley had to be replaced.    Objective: Vital signs in last 24 hours: Temp:  [98.3 F (36.8 C)-100.4 F (38 C)] 99.4 F (37.4 C) (03/14 0740) Pulse Rate:  [89-118] 94 (03/14 0600) Resp:  [12-19] 13 (03/14 0600) BP: (86-122)/(40-83) 90/41 mmHg (03/14 0600) SpO2:  [93 %-97 %] 93 % (03/14 0600) Weight:  [214 lb 8.1 oz (97.3 kg)] 214 lb 8.1 oz (97.3 kg) (03/14 0600)  Intake/Output from previous day: 03/13 0701 - 03/14 0700 In: 3565 [P.O.:30; I.V.:2500; NG/GT:25; IV Piggyback:1000] Out: 1937 [Urine:1625; Emesis/NG output:300; Drains:12] Intake/Output this shift:    Physical Exam: Work of breathing is not labored.  incisions covered.  JP serosanguinous.  Foley in place.    Lab Results:  Results for orders placed or performed during the hospital encounter of 04/08/14 (from the past 48 hour(s))  CBC     Status: Abnormal   Collection Time: 04/11/14  6:59 AM  Result Value Ref Range   WBC 17.1 (H) 4.0 - 10.5 K/uL   RBC 2.71 (L) 3.87 - 5.11 MIL/uL   Hemoglobin 8.2 (L) 12.0 - 15.0 g/dL   HCT 25.1 (L) 36.0 - 46.0 %   MCV 92.6 78.0 - 100.0 fL   MCH 30.3 26.0 - 34.0 pg   MCHC 32.7 30.0 - 36.0 g/dL   RDW 13.6 11.5 - 15.5 %   Platelets 178 150 - 400 K/uL  Basic metabolic panel     Status: Abnormal   Collection Time: 04/11/14  6:59 AM  Result Value Ref Range   Sodium 135 135 - 145 mmol/L   Potassium 3.4 (L) 3.5 - 5.1 mmol/L   Chloride 102 96 - 112 mmol/L   CO2 28 19 - 32 mmol/L   Glucose, Bld 167 (H) 70 - 99 mg/dL   BUN <5 (L) 6 - 23 mg/dL   Creatinine, Ser 0.49 (L) 0.50 - 1.10 mg/dL   Calcium 7.8 (L) 8.4 - 10.5 mg/dL   GFR calc non Af Amer >90 >90 mL/min   GFR calc Af Amer >90 >90 mL/min    Comment: (NOTE) The eGFR has been  calculated using the CKD EPI equation. This calculation has not been validated in all clinical situations. eGFR's persistently <90 mL/min signify possible Chronic Kidney Disease.    Anion gap 5 5 - 15  CBC     Status: Abnormal   Collection Time: 04/12/14  4:03 AM  Result Value Ref Range   WBC 16.0 (H) 4.0 - 10.5 K/uL   RBC 2.70 (L) 3.87 - 5.11 MIL/uL   Hemoglobin 8.1 (L) 12.0 - 15.0 g/dL   HCT 25.0 (L) 36.0 - 46.0 %   MCV 92.6 78.0 - 100.0 fL   MCH 30.0 26.0 - 34.0 pg   MCHC 32.4 30.0 - 36.0 g/dL   RDW 13.5 11.5 - 15.5 %   Platelets 219 150 - 400 K/uL  Basic metabolic panel     Status: Abnormal   Collection Time: 04/12/14  4:03 AM  Result Value Ref Range   Sodium 137 135 - 145 mmol/L   Potassium 3.6 3.5 - 5.1 mmol/L   Chloride 103 96 - 112 mmol/L   CO2 27 19 - 32  mmol/L   Glucose, Bld 143 (H) 70 - 99 mg/dL   BUN <5 (L) 6 - 23 mg/dL    Comment: CONSISTENT WITH PREVIOUS RESULT   Creatinine, Ser 0.62 0.50 - 1.10 mg/dL   Calcium 7.8 (L) 8.4 - 10.5 mg/dL   GFR calc non Af Amer >90 >90 mL/min   GFR calc Af Amer >90 >90 mL/min    Comment: (NOTE) The eGFR has been calculated using the CKD EPI equation. This calculation has not been validated in all clinical situations. eGFR's persistently <90 mL/min signify possible Chronic Kidney Disease.    Anion gap 7 5 - 15    Radiology/Results: No results found.  Anti-infectives: Anti-infectives    Start     Dose/Rate Route Frequency Ordered Stop   04/08/14 1600  cefoTEtan (CEFOTAN) 2 g in dextrose 5 % 50 mL IVPB     2 g 100 mL/hr over 30 Minutes Intravenous Every 12 hours 04/08/14 1522 04/09/14 0530   04/08/14 0537  ceFAZolin (ANCEF) IVPB 2 g/50 mL premix     2 g 100 mL/hr over 30 Minutes Intravenous On call to O.R. 04/08/14 0537 04/08/14 0759      Assessment/Plan: Problem List: Patient Active Problem List   Diagnosis Date Noted  . 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 Pancreatic adenocarcinoma 03/22/2014  . Rash of  back 03/19/2014  . Family history of malignant neoplasm of breast 02/26/2014  . Family history of prostate cancer 02/26/2014  . DVT (deep venous thrombosis) 01/11/2014  . Pancreatic mass on CT 11/15 12/25/2013  . Diverticular disease 12/22/2013  . History of DVT (deep vein thrombosis) 03/01/2011  . Hx pulmonary embolism 03/01/2011  . HTN (hypertension) 03/01/2011  . Hypercholesterolemia 03/01/2011  . Reflux 03/01/2011   IV albumin.  Leave in stepdown until later today/tomorrow for hypotension.   Sips of clears.   OOB      LOS: 4 days    [facility]  [phone]  04/12/2014 7:43 AM

## 2014-04-12 NOTE — Clinical Documentation Improvement (Signed)
04/12/14: Prog note.Marland KitchenMarland Kitchen"Had drop in HCT yesterday but this am it is stable. Also had an episode of hypotension with SBP in 80s. Foley had to be replaced."..."IV albumin. Leave in stepdown until later today/tomorrow for hypotension. Sips of clears.OOB"... 04/08/14 PROCEDURE: Robotic converted to laparoscopic hand-assisted distal pancreatectomy and splenectomy, partial colectomy; EBL 900cc per note For accurate Dx & severity can noted condition being eval'd, mon'd and tx'd be further clarified w/ possible, probable or likely cond. Thank you  Results for [name], [name] (MRN [id number]) as of 04/12/2014 09:41  Ref. Range 04/09/2014 04:04 04/10/2014 04:12 04/11/2014 06:59 04/12/2014 04:03  Hemoglobin  Latest Range: 12.0-15.0 g/dL 10.5 (L) 9.0 (L) 8.2 (L) 8.1 (L)    Ref. Range 04/09/2014 04:04 04/10/2014 04:12 04/11/2014 06:59 04/12/2014 04:03  HCT Latest Range: 36.0-46.0 % 32.5 (L) 27.7 (L) 25.1 (L) 25.0 (L)   . Documentation of Anemia should include the type of anemia: --Nutritional --Hemolytic --Aplastic --Due to blood loss, acute or chronic blood loss --Other (please specify) . Include in documentation if Anemia is due to nutrition or mineral deficits, resulting in a nutritional anemia . Document if the Anemia is due to a neoplasm (primary and/or secondary) . Link any laboratory findings to a related diagnosis (if appropriate) . Document any associated diagnoses/conditions * Unable to Determine  Supporting Information: see above note  Thank You, Ermelinda Das, RN, BSN, CCDS Certified Clinical Documentation Specialist Pager: [phone] [place]: Health Information Management

## 2014-04-13 LAB — BASIC METABOLIC PANEL
ANION GAP: 8 (ref 5–15)
BUN: <5 mg/dL — ABNORMAL LOW (ref 6–23)
CALCIUM: 8.1 mg/dL — AB (ref 8.4–10.5)
CHLORIDE: 99 mmol/L (ref 96–112)
CO2: 27 mmol/L (ref 19–32)
Creatinine, Ser: 0.48 mg/dL — ABNORMAL LOW (ref 0.50–1.10)
GFR calc Af Amer: >90 mL/min (ref >90)
GFR calc non Af Amer: >90 mL/min (ref >90)
Glucose, Bld: 150 mg/dL — ABNORMAL HIGH (ref 70–99)
POTASSIUM: 3.3 mmol/L — AB (ref 3.5–5.1)
Sodium: 134 mmol/L — ABNORMAL LOW (ref 135–145)

## 2014-04-13 LAB — GLUCOSE, CAPILLARY: GLUCOSE-CAPILLARY: 138 mg/dL — AB (ref 70–99)

## 2014-04-13 LAB — CBC
HCT: 22.3 % — ABNORMAL LOW (ref 36.0–46.0)
Hemoglobin: 7.2 g/dL — ABNORMAL LOW (ref 12.0–15.0)
MCH: 29.5 pg (ref 26.0–34.0)
MCHC: 32.3 g/dL (ref 30.0–36.0)
MCV: 91.4 fL (ref 78.0–100.0)
PLATELETS: 260 10*3/uL (ref 150–400)
RBC: 2.44 MIL/uL — AB (ref 3.87–5.11)
RDW: 13.3 % (ref 11.5–15.5)
WBC: 12.4 10*3/uL — AB (ref 4.0–10.5)

## 2014-04-13 MED ORDER — POTASSIUM CHLORIDE 10 MEQ/100ML IV SOLN
10.0000 meq | INTRAVENOUS | Status: AC
  Administered 2014-04-13 (×3): 10 meq via INTRAVENOUS
  Filled 2014-04-13 (×4): qty 100

## 2014-04-13 NOTE — Progress Notes (Signed)
Patient ID: [name], female   DOB: Jan 22, 1946, 69 y.o.   MRN: [id number] [facility] Surgery Progress Note:   5 Days Post-Op   Subjective: Pt doing better.  No n/v.  No belching.  No flatus yet.    Objective: Vital signs in last 24 hours: Temp:  [98 F (36.7 C)-98.9 F (37.2 C)] 98.8 F (37.1 C) (03/15 0450) Pulse Rate:  [80-104] 80 (03/15 0450) Resp:  [11-18] 18 (03/15 0732) BP: (106-131)/(41-58) 108/41 mmHg (03/15 0450) SpO2:  [98 %-100 %] 98 % (03/15 0732)  Intake/Output from previous day: 03/14 0701 - 03/15 0700 In: 2720 [P.O.:120; I.V.:2300; IV Piggyback:300] Out: 1055 [Urine:1050; Drains:5] Intake/Output this shift:    Physical Exam:  General:  A&O x3 CV RR&R Pulm:  No distress, easy breathing. Abd:  Soft, decreased distention, non tender. Drain serosang.  Lab Results:  Results for orders placed or performed during the hospital encounter of 04/08/14 (from the past 48 hour(s))  CBC     Status: Abnormal   Collection Time: 04/12/14  4:03 AM  Result Value Ref Range   WBC 16.0 (H) 4.0 - 10.5 K/uL   RBC 2.70 (L) 3.87 - 5.11 MIL/uL   Hemoglobin 8.1 (L) 12.0 - 15.0 g/dL   HCT 25.0 (L) 36.0 - 46.0 %   MCV 92.6 78.0 - 100.0 fL   MCH 30.0 26.0 - 34.0 pg   MCHC 32.4 30.0 - 36.0 g/dL   RDW 13.5 11.5 - 15.5 %   Platelets 219 150 - 400 K/uL  Basic metabolic panel     Status: Abnormal   Collection Time: 04/12/14  4:03 AM  Result Value Ref Range   Sodium 137 135 - 145 mmol/L   Potassium 3.6 3.5 - 5.1 mmol/L   Chloride 103 96 - 112 mmol/L   CO2 27 19 - 32 mmol/L   Glucose, Bld 143 (H) 70 - 99 mg/dL   BUN <5 (L) 6 - 23 mg/dL    Comment: CONSISTENT WITH PREVIOUS RESULT   Creatinine, Ser 0.62 0.50 - 1.10 mg/dL   Calcium 7.8 (L) 8.4 - 10.5 mg/dL   GFR calc non Af Amer >90 >90 mL/min   GFR calc Af Amer >90 >90 mL/min    Comment: (NOTE) The eGFR has been calculated using the CKD EPI equation. This calculation has not been validated in all clinical  situations. eGFR's persistently <90 mL/min signify possible Chronic Kidney Disease.    Anion gap 7 5 - 15    Radiology/Results: No results found.  Anti-infectives: Anti-infectives    Start     Dose/Rate Route Frequency Ordered Stop   04/08/14 1600  cefoTEtan (CEFOTAN) 2 g in dextrose 5 % 50 mL IVPB     2 g 100 mL/hr over 30 Minutes Intravenous Every 12 hours 04/08/14 1522 04/09/14 0530   04/08/14 0537  ceFAZolin (ANCEF) IVPB 2 g/50 mL premix     2 g 100 mL/hr over 30 Minutes Intravenous On call to O.R. 04/08/14 0537 04/08/14 0759      Assessment/Plan: Problem List: Patient Active Problem List   Diagnosis Date Noted  . 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 Pancreatic adenocarcinoma 03/22/2014  . Rash of back 03/19/2014  . Family history of malignant neoplasm of breast 02/26/2014  . Family history of prostate cancer 02/26/2014  . DVT (deep venous thrombosis) 01/11/2014  . Pancreatic mass on CT 11/15 12/25/2013  . Diverticular disease 12/22/2013  . History of DVT (deep vein thrombosis) 03/01/2011  .  Hx pulmonary embolism 03/01/2011  . HTN (hypertension) 03/01/2011  . Hypercholesterolemia 03/01/2011  . Reflux 03/01/2011  ABL anemia  Clear liquid diet Transfer to floor Leave on pca. ARBF Ambulate.    LOS: 5 days    [facility]  [phone]  04/13/2014 8:42 AM

## 2014-04-13 NOTE — Progress Notes (Signed)
Notified daughter, Sheily Lineman, that the patient is transferring to room 1523.

## 2014-04-13 NOTE — Progress Notes (Signed)
Hgb 7.2, K+ 3.3   These labs called to Dr. Chipper Herb office.  Results received by Tawni Pummel, RN.  These were not called from lab as critical values.

## 2014-04-14 LAB — CBC
HCT: 22.9 % — ABNORMAL LOW (ref 36.0–46.0)
Hemoglobin: 7.6 g/dL — ABNORMAL LOW (ref 12.0–15.0)
MCH: 30.4 pg (ref 26.0–34.0)
MCHC: 33.2 g/dL (ref 30.0–36.0)
MCV: 91.6 fL (ref 78.0–100.0)
PLATELETS: 312 10*3/uL (ref 150–400)
RBC: 2.5 MIL/uL — ABNORMAL LOW (ref 3.87–5.11)
RDW: 13.6 % (ref 11.5–15.5)
WBC: 15.8 10*3/uL — AB (ref 4.0–10.5)

## 2014-04-14 LAB — URINALYSIS, ROUTINE W REFLEX MICROSCOPIC
BILIRUBIN URINE: NEGATIVE
Glucose, UA: NEGATIVE mg/dL
KETONES UR: NEGATIVE mg/dL
Nitrite: NEGATIVE
PH: 7 (ref 5.0–8.0)
Protein, ur: NEGATIVE mg/dL
Specific Gravity, Urine: 1.009 (ref 1.005–1.030)
Urobilinogen, UA: 0.2 mg/dL (ref 0.0–1.0)

## 2014-04-14 LAB — BASIC METABOLIC PANEL
ANION GAP: 12 (ref 5–15)
BUN: <5 mg/dL — ABNORMAL LOW (ref 6–23)
CO2: 27 mmol/L (ref 19–32)
CREATININE: 0.55 mg/dL (ref 0.50–1.10)
Calcium: 8.4 mg/dL (ref 8.4–10.5)
Chloride: 97 mmol/L (ref 96–112)
GFR calc Af Amer: >90 mL/min (ref >90)
GFR calc non Af Amer: >90 mL/min (ref >90)
Glucose, Bld: 144 mg/dL — ABNORMAL HIGH (ref 70–99)
Potassium: 3.5 mmol/L (ref 3.5–5.1)
Sodium: 136 mmol/L (ref 135–145)

## 2014-04-14 LAB — URINE MICROSCOPIC-ADD ON

## 2014-04-14 MED ORDER — BISACODYL 10 MG RE SUPP
10.0000 mg | Freq: Every day | RECTAL | Status: DC | PRN
  Filled 2014-04-14: qty 1

## 2014-04-14 MED ORDER — BISACODYL 10 MG RE SUPP: 10.0000 mg | Freq: Every day | RECTAL | Status: DC

## 2014-04-14 NOTE — Progress Notes (Signed)
Patient ID: [name], female   DOB: [birth year], 70 y.o.   MRN: [id number] [facility] Surgery Progress Note:   6 Days Post-Op   Subjective: Transferred to floor yesterday.  + flatus, no BM.  Tolerating clear liquids.   HCT dropped yesterday, but stable today.  Objective: Vital signs in last 24 hours: Temp:  [98.6 F (37 C)-100.5 F (38.1 C)] 100.1 F (37.8 C) (03/16 9563) Pulse Rate:  [93-103] 93 (03/16 0613) Resp:  [16-20] 18 (03/16 0613) BP: (125-149)/(57-78) 125/57 mmHg (03/16 0613) SpO2:  [93 %-99 %] 93 % (03/16 0613)  Intake/Output from previous day: 03/15 0701 - 03/16 0700 In: 3100 [P.O.:600; I.V.:2500] Out: 2250 [Urine:2250] Intake/Output this shift: Total I/O In: 1800 [P.O.:600; I.V.:1200] Out: 1400 [Urine:1400]  Physical Exam:  General:  A&O x3 CV RR&R Pulm:  No distress, easy breathing. Abd:  Soft, decreased distention, non tender. Drain serosang. No erythema or drainage on midline wound.  Lab Results:  Results for orders placed or performed during the hospital encounter of 04/08/14 (from the past 48 hour(s))  Glucose, capillary     Status: Abnormal   Collection Time: 04/13/14  7:36 AM  Result Value Ref Range   Glucose-Capillary 138 (H) 70 - 99 mg/dL   Comment 1 Notify RN    Comment 2 Document in Chart   CBC     Status: Abnormal   Collection Time: 04/13/14  8:15 AM  Result Value Ref Range   WBC 12.4 (H) 4.0 - 10.5 K/uL   RBC 2.44 (L) 3.87 - 5.11 MIL/uL   Hemoglobin 7.2 (L) 12.0 - 15.0 g/dL   HCT 22.3 (L) 36.0 - 46.0 %   MCV 91.4 78.0 - 100.0 fL   MCH 29.5 26.0 - 34.0 pg   MCHC 32.3 30.0 - 36.0 g/dL   RDW 13.3 11.5 - 15.5 %   Platelets 260 150 - 400 K/uL  Basic metabolic panel     Status: Abnormal   Collection Time: 04/13/14  8:15 AM  Result Value Ref Range   Sodium 134 (L) 135 - 145 mmol/L   Potassium 3.3 (L) 3.5 - 5.1 mmol/L   Chloride 99 96 - 112 mmol/L   CO2 27 19 - 32 mmol/L   Glucose, Bld 150 (H) 70 - 99 mg/dL   BUN <5 (L) 6 - 23  mg/dL   Creatinine, Ser 0.48 (L) 0.50 - 1.10 mg/dL   Calcium 8.1 (L) 8.4 - 10.5 mg/dL   GFR calc non Af Amer >90 >90 mL/min   GFR calc Af Amer >90 >90 mL/min    Comment: (NOTE) The eGFR has been calculated using the CKD EPI equation. This calculation has not been validated in all clinical situations. eGFR's persistently <90 mL/min signify possible Chronic Kidney Disease.    Anion gap 8 5 - 15  CBC     Status: Abnormal   Collection Time: 04/14/14  5:05 AM  Result Value Ref Range   WBC 15.8 (H) 4.0 - 10.5 K/uL   RBC 2.50 (L) 3.87 - 5.11 MIL/uL   Hemoglobin 7.6 (L) 12.0 - 15.0 g/dL   HCT 22.9 (L) 36.0 - 46.0 %   MCV 91.6 78.0 - 100.0 fL   MCH 30.4 26.0 - 34.0 pg   MCHC 33.2 30.0 - 36.0 g/dL   RDW 13.6 11.5 - 15.5 %   Platelets 312 150 - 400 K/uL    Radiology/Results: No results found.  Anti-infectives: Anti-infectives    Start     Dose/Rate  Route Frequency Ordered Stop   04/08/14 1600  cefoTEtan (CEFOTAN) 2 g in dextrose 5 % 50 mL IVPB     2 g 100 mL/hr over 30 Minutes Intravenous Every 12 hours 04/08/14 1522 04/09/14 0530   04/08/14 0537  ceFAZolin (ANCEF) IVPB 2 g/50 mL premix     2 g 100 mL/hr over 30 Minutes Intravenous On call to O.R. 04/08/14 0537 04/08/14 0759      Assessment/Plan: Problem List: Patient Active Problem List   Diagnosis Date Noted  . 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 Pancreatic adenocarcinoma 03/22/2014  . Rash of back 03/19/2014  . Family history of malignant neoplasm of breast 02/26/2014  . Family history of prostate cancer 02/26/2014  . DVT (deep venous thrombosis) 01/11/2014  . Pancreatic mass on CT 11/15 12/25/2013  . Diverticular disease 12/22/2013  . History of DVT (deep vein thrombosis) 03/01/2011  . Hx pulmonary embolism 03/01/2011  . HTN (hypertension) 03/01/2011  . Hypercholesterolemia 03/01/2011  . Reflux 03/01/2011    ABL anemia Advance to full liquids. Suppository tomorrow if no BM.   If HCT stable tomorrow,  will restart lovenox Ambulate. U/a for low grade temp and leukocytosis.    LOS: 6 days    [facility]  [phone]  04/14/2014 6:20 AM

## 2014-04-15 ENCOUNTER — Inpatient Hospital Stay (HOSPITAL_COMMUNITY): Payer: Medicare Other

## 2014-04-15 LAB — CBC WITH DIFFERENTIAL/PLATELET
BASOS ABS: 0 10*3/uL (ref 0.0–0.1)
Basophils Relative: 0 % (ref 0–1)
EOS ABS: 0 10*3/uL (ref 0.0–0.7)
EOS PCT: 0 % (ref 0–5)
HEMATOCRIT: 22 % — AB (ref 36.0–46.0)
Hemoglobin: 7.3 g/dL — ABNORMAL LOW (ref 12.0–15.0)
LYMPHS ABS: 1.7 10*3/uL (ref 0.7–4.0)
Lymphocytes Relative: 9 % — ABNORMAL LOW (ref 12–46)
MCH: 30.3 pg (ref 26.0–34.0)
MCHC: 33.2 g/dL (ref 30.0–36.0)
MCV: 91.3 fL (ref 78.0–100.0)
MONOS PCT: 12 % (ref 3–12)
Monocytes Absolute: 2.3 10*3/uL — ABNORMAL HIGH (ref 0.1–1.0)
NEUTROS ABS: 15.1 10*3/uL — AB (ref 1.7–7.7)
Neutrophils Relative %: 79 % — ABNORMAL HIGH (ref 43–77)
PLATELETS: 320 10*3/uL (ref 150–400)
RBC: 2.41 MIL/uL — AB (ref 3.87–5.11)
RDW: 13.3 % (ref 11.5–15.5)
WBC: 19.1 10*3/uL — AB (ref 4.0–10.5)

## 2014-04-15 LAB — BASIC METABOLIC PANEL
Anion gap: 10 (ref 5–15)
BUN: 5 mg/dL — ABNORMAL LOW (ref 6–23)
CALCIUM: 8.2 mg/dL — AB (ref 8.4–10.5)
CHLORIDE: 98 mmol/L (ref 96–112)
CO2: 27 mmol/L (ref 19–32)
Creatinine, Ser: 0.55 mg/dL (ref 0.50–1.10)
GFR calc Af Amer: >90 mL/min (ref >90)
GFR calc non Af Amer: >90 mL/min (ref >90)
Glucose, Bld: 127 mg/dL — ABNORMAL HIGH (ref 70–99)
Potassium: 3.7 mmol/L (ref 3.5–5.1)
SODIUM: 135 mmol/L (ref 135–145)

## 2014-04-15 MED ORDER — VANCOMYCIN HCL 10 G IV SOLR
1250.0000 mg | Freq: Two times a day (BID) | INTRAVENOUS | Status: DC
  Administered 2014-04-15 – 2014-04-16 (×3): 1250 mg via INTRAVENOUS
  Filled 2014-04-15 (×4): qty 1250

## 2014-04-15 MED ORDER — VANCOMYCIN HCL 10 G IV SOLR
1500.0000 mg | Freq: Once | INTRAVENOUS | Status: AC
  Administered 2014-04-15: 1500 mg via INTRAVENOUS
  Filled 2014-04-15 (×2): qty 1500

## 2014-04-15 MED ORDER — IOHEXOL 300 MG/ML  SOLN
25.0000 mL | INTRAMUSCULAR | Status: AC
  Administered 2014-04-15 (×2): 25 mL via ORAL

## 2014-04-15 MED ORDER — PIPERACILLIN-TAZOBACTAM 3.375 G IVPB
3.3750 g | Freq: Three times a day (TID) | INTRAVENOUS | Status: DC
  Administered 2014-04-15 – 2014-04-20 (×15): 3.375 g via INTRAVENOUS
  Filled 2014-04-15 (×17): qty 50

## 2014-04-15 MED ORDER — IOHEXOL 300 MG/ML  SOLN
100.0000 mL | Freq: Once | INTRAMUSCULAR | Status: AC | PRN
  Administered 2014-04-15: 100 mL via INTRAVENOUS

## 2014-04-15 MED ORDER — PIPERACILLIN-TAZOBACTAM 3.375 G IVPB 30 MIN
3.3750 g | Freq: Once | INTRAVENOUS | Status: AC
  Administered 2014-04-15: 3.375 g via INTRAVENOUS
  Filled 2014-04-15: qty 50

## 2014-04-15 NOTE — Progress Notes (Signed)
Patient ID: [name], female   DOB: 1945/09/26, 69 y.o.   MRN: [id number] [facility] Surgery Progress Note:   7 Days Post-Op   Subjective: Continues to have flatus.  No n/v.  Tolerated full liquids.  Pt continues to have low grade temps and WBCs are continuing to go back up.    Objective: Vital signs in last 24 hours: Temp:  [98.5 F (36.9 C)-99.4 F (37.4 C)] 98.5 F (36.9 C) (03/17 0959) Pulse Rate:  [80-108] 102 (03/17 0959) Resp:  [16-23] 20 (03/17 0959) BP: (91-139)/(54-88) 120/66 mmHg (03/17 0959) SpO2:  [94 %-99 %] 99 % (03/17 0959)  Intake/Output from previous day: 03/16 0701 - 03/17 0700 In: 2403 [P.O.:1403; I.V.:1000] Out: 155 [Urine:150; Drains:5] Intake/Output this shift: Total I/O In: 0  Out: 600 [Urine:600]  Physical Exam:  General:  A&O x3 CV RR&R Pulm:  No distress, easy breathing. Abd:  Soft, still sl distended, non tender. Drain with some purulent material in the tubing.   No erythema or drainage on midline wound.  Lab Results:  Results for orders placed or performed during the hospital encounter of 04/08/14 (from the past 48 hour(s))  CBC     Status: Abnormal   Collection Time: 04/14/14  5:05 AM  Result Value Ref Range   WBC 15.8 (H) 4.0 - 10.5 K/uL   RBC 2.50 (L) 3.87 - 5.11 MIL/uL   Hemoglobin 7.6 (L) 12.0 - 15.0 g/dL   HCT 22.9 (L) 36.0 - 46.0 %   MCV 91.6 78.0 - 100.0 fL   MCH 30.4 26.0 - 34.0 pg   MCHC 33.2 30.0 - 36.0 g/dL   RDW 13.6 11.5 - 15.5 %   Platelets 312 150 - 400 K/uL  Basic metabolic panel     Status: Abnormal   Collection Time: 04/14/14  5:05 AM  Result Value Ref Range   Sodium 136 135 - 145 mmol/L   Potassium 3.5 3.5 - 5.1 mmol/L   Chloride 97 96 - 112 mmol/L   CO2 27 19 - 32 mmol/L   Glucose, Bld 144 (H) 70 - 99 mg/dL   BUN <5 (L) 6 - 23 mg/dL   Creatinine, Ser 0.55 0.50 - 1.10 mg/dL   Calcium 8.4 8.4 - 10.5 mg/dL   GFR calc non Af Amer >90 >90 mL/min   GFR calc Af Amer >90 >90 mL/min    Comment: (NOTE) The  eGFR has been calculated using the CKD EPI equation. This calculation has not been validated in all clinical situations. eGFR's persistently <90 mL/min signify possible Chronic Kidney Disease.    Anion gap 12 5 - 15  Urinalysis, Routine w reflex microscopic     Status: Abnormal   Collection Time: 04/14/14  8:33 AM  Result Value Ref Range   Color, Urine YELLOW YELLOW   APPearance CLEAR CLEAR   Specific Gravity, Urine 1.009 1.005 - 1.030   pH 7.0 5.0 - 8.0   Glucose, UA NEGATIVE NEGATIVE mg/dL   Hgb urine dipstick SMALL (A) NEGATIVE   Bilirubin Urine NEGATIVE NEGATIVE   Ketones, ur NEGATIVE NEGATIVE mg/dL   Protein, ur NEGATIVE NEGATIVE mg/dL   Urobilinogen, UA 0.2 0.0 - 1.0 mg/dL   Nitrite NEGATIVE NEGATIVE   Leukocytes, UA SMALL (A) NEGATIVE  Urine microscopic-add on     Status: Abnormal   Collection Time: 04/14/14  8:33 AM  Result Value Ref Range   Squamous Epithelial / LPF RARE RARE   WBC, UA 3-6 <3 WBC/hpf   RBC /  HPF 0-2 <3 RBC/hpf   Bacteria, UA RARE RARE   Casts HYALINE CASTS (A) NEGATIVE   Urine-Other MUCOUS PRESENT   CBC with Differential/Platelet     Status: Abnormal   Collection Time: 04/15/14  5:04 AM  Result Value Ref Range   WBC 19.1 (H) 4.0 - 10.5 K/uL   RBC 2.41 (L) 3.87 - 5.11 MIL/uL   Hemoglobin 7.3 (L) 12.0 - 15.0 g/dL   HCT 22.0 (L) 36.0 - 46.0 %   MCV 91.3 78.0 - 100.0 fL   MCH 30.3 26.0 - 34.0 pg   MCHC 33.2 30.0 - 36.0 g/dL   RDW 13.3 11.5 - 15.5 %   Platelets 320 150 - 400 K/uL   Neutrophils Relative % 79 (H) 43 - 77 %   Lymphocytes Relative 9 (L) 12 - 46 %   Monocytes Relative 12 3 - 12 %   Eosinophils Relative 0 0 - 5 %   Basophils Relative 0 0 - 1 %   Neutro Abs 15.1 (H) 1.7 - 7.7 K/uL   Lymphs Abs 1.7 0.7 - 4.0 K/uL   Monocytes Absolute 2.3 (H) 0.1 - 1.0 K/uL   Eosinophils Absolute 0.0 0.0 - 0.7 K/uL   Basophils Absolute 0.0 0.0 - 0.1 K/uL   RBC Morphology POLYCHROMASIA PRESENT    WBC Morphology VACUOLATED NEUTROPHILS    Smear Review  LARGE PLATELETS PRESENT   Basic metabolic panel     Status: Abnormal   Collection Time: 04/15/14  5:04 AM  Result Value Ref Range   Sodium 135 135 - 145 mmol/L   Potassium 3.7 3.5 - 5.1 mmol/L   Chloride 98 96 - 112 mmol/L   CO2 27 19 - 32 mmol/L   Glucose, Bld 127 (H) 70 - 99 mg/dL   BUN 5 (L) 6 - 23 mg/dL   Creatinine, Ser 0.55 0.50 - 1.10 mg/dL   Calcium 8.2 (L) 8.4 - 10.5 mg/dL   GFR calc non Af Amer >90 >90 mL/min   GFR calc Af Amer >90 >90 mL/min    Comment: (NOTE) The eGFR has been calculated using the CKD EPI equation. This calculation has not been validated in all clinical situations. eGFR's persistently <90 mL/min signify possible Chronic Kidney Disease.    Anion gap 10 5 - 15    Radiology/Results: No results found.  Anti-infectives: Anti-infectives    Start     Dose/Rate Route Frequency Ordered Stop   04/15/14 1800  vancomycin (VANCOCIN) 1,250 mg in sodium chloride 0.9 % 250 mL IVPB     1,250 mg 166.7 mL/hr over 90 Minutes Intravenous Every 12 hours 04/15/14 0801     04/15/14 1500  piperacillin-tazobactam (ZOSYN) IVPB 3.375 g     3.375 g 12.5 mL/hr over 240 Minutes Intravenous 3 times per day 04/15/14 0801     04/15/14 0900  vancomycin (VANCOCIN) 1,500 mg in sodium chloride 0.9 % 500 mL IVPB     1,500 mg 250 mL/hr over 120 Minutes Intravenous  Once 04/15/14 0801     04/15/14 0830  piperacillin-tazobactam (ZOSYN) IVPB 3.375 g     3.375 g 100 mL/hr over 30 Minutes Intravenous  Once 04/15/14 0801 04/15/14 0928   04/08/14 1600  cefoTEtan (CEFOTAN) 2 g in dextrose 5 % 50 mL IVPB     2 g 100 mL/hr over 30 Minutes Intravenous Every 12 hours 04/08/14 1522 04/09/14 0530   04/08/14 0537  ceFAZolin (ANCEF) IVPB 2 g/50 mL premix     2 g 100  mL/hr over 30 Minutes Intravenous On call to O.R. 04/08/14 0537 04/08/14 0759      Assessment/Plan: Problem List: Patient Active Problem List   Diagnosis Date Noted  . 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 Pancreatic  adenocarcinoma 03/22/2014  . Rash of back 03/19/2014  . Family history of malignant neoplasm of breast 02/26/2014  . Family history of prostate cancer 02/26/2014  . DVT (deep venous thrombosis) 01/11/2014  . Pancreatic mass on CT 11/15 12/25/2013  . Diverticular disease 12/22/2013  . History of DVT (deep vein thrombosis) 03/01/2011  . Hx pulmonary embolism 03/01/2011  . HTN (hypertension) 03/01/2011  . Hypercholesterolemia 03/01/2011  . Reflux 03/01/2011    ABL anemia Full liquids. Suppository today CT scan to evaluate WBCs, purulent drainage, and low grade temps.   If HCT stable tomorrow, start lovenox after CT scan if no perc drain needed.   Ambulate. Start pain pills.  Will advance diet depending on CT result.       LOS: 7 days    [facility]  [phone]  04/15/2014 10:19 AM

## 2014-04-15 NOTE — Progress Notes (Signed)
Writer continues to encourage pt to get OOB to try to toilet. Pt continues to refuse stating "I don't feel like it". Will continue to attempt to get pt OOB.

## 2014-04-15 NOTE — Progress Notes (Signed)
ANTIBIOTIC CONSULT NOTE - INITIAL  Pharmacy Consult for Vancomycin/Zosyn Indication: possible abscess  Allergies  Allergen Reactions  . Sulfa Antibiotics Swelling    Patient Measurements: Height: 5' 9.5" (176.5 cm) Weight: 214 lb 8.1 oz (97.3 kg) IBW/kg (Calculated) : 67.35 Adjusted Body Weight: n/a  Vital Signs: Temp: 99.4 F (37.4 C) (03/17 1407) Temp Source: Oral (03/17 1407) BP: 147/71 mmHg (03/17 1407) Pulse Rate: 99 (03/17 1407) Intake/Output from previous day: 03/16 0701 - 03/17 0700 In: 2403 [P.O.:1403; I.V.:1000] Out: 155 [Urine:150; Drains:5] Intake/Output from this shift: Total I/O In: 360 [P.O.:360] Out: 600 [Urine:600]  Labs:  Recent Labs  04/13/14 0815 04/14/14 0505 04/15/14 0504  WBC 12.4* 15.8* 19.1*  HGB 7.2* 7.6* 7.3*  PLT 260 312 320  CREATININE 0.48* 0.55 0.55   Estimated Creatinine Clearance: 85.5 mL/min (by C-G formula based on Cr of 0.55). No results for input(s): VANCOTROUGH, VANCOPEAK, VANCORANDOM, GENTTROUGH, GENTPEAK, GENTRANDOM, TOBRATROUGH, TOBRAPEAK, TOBRARND, AMIKACINPEAK, AMIKACINTROU, AMIKACIN in the last 72 hours.   Microbiology: No results found for this or any previous visit (from the past 720 hour(s)).  Medical History: Past Medical History  Diagnosis Date  . Glaucoma   . Clotting disorder   . DVT (deep venous thrombosis)     LEFT  . GERD (gastroesophageal reflux disease)   . Hypercholesterolemia   . Peripheral neuropathy   . Essential tremor   . Diverticulosis   . Arthritis     knees and back  . Cancer     Pancreatic  . Hypertension   . Colon polyps   . Urine incontinence   . UTI (lower urinary tract infection)   . Complication of anesthesia     slow to wak up   . Tachycardia   . Shortness of breath dyspnea     with exertion   . Pulmonary embolism     Medications:  Scheduled:  . antiseptic oral rinse  7 mL Mouth Rinse q12n4p  . atorvastatin  40 mg Oral q1800  . brimonidine  1 drop Left Eye BID   And  . timolol  1 drop Left Eye BID  . chlorhexidine  15 mL Mouth Rinse BID  . diltiazem  120 mg Oral Daily  . gabapentin  300 mg Oral QHS  . latanoprost  1 drop Both Eyes QHS  . morphine   Intravenous 6 times per day  . pantoprazole (PROTONIX) IV  40 mg Intravenous Q12H  . piperacillin-tazobactam (ZOSYN)  IV  3.375 g Intravenous 3 times per day  . primidone  50 mg Oral QHS  . propranolol ER  60 mg Oral Daily  . vancomycin  1,250 mg Intravenous Q12H   Infusions:  . dextrose 5 % and 0.45 % NaCl with KCl 20 mEq/L 50 mL/hr at 04/15/14 0600   PRN: acetaminophen, alum & mag hydroxide-simeth, bisacodyl, diphenhydrAMINE **OR** diphenhydrAMINE, HYDROcodone-acetaminophen, lip balm, morphine injection, naloxone **AND** sodium chloride, ondansetron **OR** ondansetron (ZOFRAN) IV, traMADol    Assessment 85 yoF with pancreatic adenoCa, underwent pancreatectomy, splenectomy, and partial colectomy 3/10. Required mech ventilation post-op.  Now transferred to medical floor, bowel function slowly returning but with new temps and elevated WBCs, and some purulent drainage at Sgx site.  To CT soon to r/o abscess, but Surgery would like to start vancomycin/Zosyn empirically until then.    3/17 >> vancomycin >> 3/17 >> Zosyn >>    Tmax: 100.5 on 3/15, afeb since WBCs: increasing since 3/15, up to 19 Renal: SCr low, CrCl 86 using SCr 0.8  No Cx obtained   Goal of Therapy:   Vancomycin trough level 15-20 mcg/ml   Eradication of infection  Appropriate antibiotic dosing for indication and renal function  Plan:   Vancomycin 1500 mg IV x1, then 1250 mg IV q12h  Zosyn 3.375 mg IV x1 over 30 min, then q8h given by 4-hr infusion  Measure vancomycin trough levels at steady state as indicated  Follow clinical course, culture results as available, renal function  Follow for de-escalation of antibiotics and LOT   Reuel Boom, PharmD Pager: [phone] 04/15/2014, 2:09 PM

## 2014-04-15 NOTE — Progress Notes (Signed)
Pt presented with episode of emesis. Bile and stomach acid noted. Possibly vomited contrast. Pt stated "it is that horrible water you made me drink. I have felt sick ever since". Will continue to monitor.

## 2014-04-15 NOTE — Evaluation (Signed)
Physical Therapy Evaluation Patient Details Name: [name] MRN: [id number] DOB: 11-Sep-1945 Today's Date: 04/15/2014   History of Present Illness  69 yo female s/p lap pancreatectomy, splenectomy, partial colectomy 04/08/14. hx of pancreatic cancer, glaucoma, DVT, peripheral neuropathy  Clinical Impression  On eval, pt required Max assist +2 for bed mobility and Mod assist for stand pivot x 2 bed<>bsc with rollator. Max encouragement for participation. Pt is deconditioned-fatigues very quickly/easily with minimal activity. Total assist for toileting hygiene. Recommend ST rehab at SNF.     Follow Up Recommendations SNF;Supervision/Assistance - 24 hour    Equipment Recommendations  None recommended by PT    Recommendations for Other Services OT consult     Precautions / Restrictions Precautions Precautions: Fall Precaution Comments: multiple lines/leads, abdominal surgery Restrictions Weight Bearing Restrictions: No      Mobility  Bed Mobility Overal bed mobility: Needs Assistance Bed Mobility: Supine to Sit;Sit to Sidelying     Supine to sit: HOB elevated;Max assist;+2 for physical assistance;+2 for safety/equipment   Sit to sidelying: Mod assist;+2 for physical assistance;+2 for safety/equipment General bed mobility comments: Assist for trunk and bil LEs. Increased time. Utilized bedpad for scooting, positioning.   Transfers Overall transfer level: Needs assistance Equipment used: 4-wheeled walker Transfers: Sit to/from [facility] Sit to Stand: Mod assist Stand pivot transfers: Mod assist       General transfer comment: Assist to rise, stabilize, control descent. Multimodal cues for safety, technique, hand placement. Stand pivot x 2, bed<>bsc with rollator. Increased time for all tasks. O2 sats 88-91% on RA.   Ambulation/Gait             General Gait Details: NT-pt unable-too fatigued/weak  Stairs            Wheelchair Mobility     Modified Rankin (Stroke Patients Only)       Balance                                             Pertinent Vitals/Pain Pain Assessment: 0-10 Pain Score: 6  Pain Location: abdomen Pain Intervention(s): PCA encouraged;Monitored during session;Limited activity within patient's tolerance;Repositioned    Home Living Family/patient expects to be discharged to:: Unsure   Available Help at Discharge: Family Type of Home: House Home Access: Stairs to enter   Technical brewer of Steps: 2-3 Home Layout: One level Home Equipment: Environmental consultant - 4 wheels      Prior Function Level of Independence: Independent with assistive device(s)               Hand Dominance        Extremity/Trunk Assessment   Upper Extremity Assessment: Generalized weakness           Lower Extremity Assessment: Generalized weakness      Cervical / Trunk Assessment: Normal  Communication   Communication: No difficulties  Cognition Arousal/Alertness: Lethargic Behavior During Therapy: Flat affect Overall Cognitive Status: Within Functional Limits for tasks assessed                      General Comments      Exercises        Assessment/Plan    PT Assessment Patient needs continued PT services  PT Diagnosis Difficulty walking;Generalized weakness;Acute pain   PT Problem List Decreased strength;Decreased activity tolerance;Decreased balance;Decreased mobility;Obesity;Decreased knowledge of use of DME;Pain  PT Treatment Interventions DME instruction;Gait training;Functional mobility training;Therapeutic activities;Therapeutic exercise;Patient/family education;Balance training   PT Goals (Current goals can be found in the Care Plan section) Acute Rehab PT Goals Patient Stated Goal: none stated PT Goal Formulation: With patient Time For Goal Achievement: 04/29/14 Potential to Achieve Goals: Good    Frequency Min 3X/week   Barriers to discharge         Co-evaluation               End of Session   Activity Tolerance: Patient limited by fatigue;Patient limited by pain Patient left: in bed;with call bell/phone within reach           Time: 1425-1505 PT Time Calculation (min) (ACUTE ONLY): 40 min   Charges:   PT Evaluation $Initial PT Evaluation Tier I: 1 Procedure PT Treatments $Therapeutic Activity: 8-22 mins   PT G Codes:        Weston Anna, MPT Pager: [phone]

## 2014-04-16 ENCOUNTER — Inpatient Hospital Stay (HOSPITAL_COMMUNITY): Payer: Medicare Other

## 2014-04-16 ENCOUNTER — Encounter (HOSPITAL_COMMUNITY): Payer: Self-pay | Admitting: Radiology

## 2014-04-16 MED ORDER — ENOXAPARIN SODIUM 100 MG/ML ~~LOC~~ SOLN: 100.0000 mg | Freq: Two times a day (BID) | SUBCUTANEOUS | Status: DC

## 2014-04-16 MED ORDER — MIDAZOLAM HCL 2 MG/2ML IJ SOLN
INTRAMUSCULAR | Status: AC
  Filled 2014-04-16: qty 6

## 2014-04-16 MED ORDER — FENTANYL CITRATE 0.05 MG/ML IJ SOLN
INTRAMUSCULAR | Status: AC
  Filled 2014-04-16: qty 4

## 2014-04-16 MED ORDER — ENOXAPARIN SODIUM 100 MG/ML ~~LOC~~ SOLN
100.0000 mg | Freq: Two times a day (BID) | SUBCUTANEOUS | Status: DC
  Administered 2014-04-18: 100 mg via SUBCUTANEOUS
  Filled 2014-04-16 (×2): qty 1

## 2014-04-16 MED ORDER — ALBUTEROL SULFATE (2.5 MG/3ML) 0.083% IN NEBU
2.5000 mg | INHALATION_SOLUTION | Freq: Four times a day (QID) | RESPIRATORY_TRACT | Status: DC | PRN
  Administered 2014-04-17: 2.5 mg via RESPIRATORY_TRACT
  Filled 2014-04-16: qty 3

## 2014-04-16 MED ORDER — ENOXAPARIN SODIUM 100 MG/ML ~~LOC~~ SOLN
100.0000 mg | Freq: Once | SUBCUTANEOUS | Status: AC
  Administered 2014-04-17: 100 mg via SUBCUTANEOUS
  Filled 2014-04-16: qty 1

## 2014-04-16 MED ORDER — ENOXAPARIN SODIUM 100 MG/ML ~~LOC~~ SOLN
100.0000 mg | Freq: Once | SUBCUTANEOUS | Status: AC
  Administered 2014-04-18: 100 mg via SUBCUTANEOUS
  Filled 2014-04-16: qty 1

## 2014-04-16 MED ORDER — MIDAZOLAM HCL 2 MG/2ML IJ SOLN
INTRAMUSCULAR | Status: AC | PRN
  Administered 2014-04-16 (×2): 0.5 mg via INTRAVENOUS

## 2014-04-16 NOTE — Consult Note (Signed)
Referring Physician(s): CCS  History of Present Illness: [name] is a 69 y.o. 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 Patient has had rising white blood cell count and intermittent fevers as well as persistent abdominal pain and nausea. Follow up CT abdomen on 04/15/2014 revealed a 5.9 x 3.1 cm fluid collection superior and medial to the upper pole of the left kidney as well as a surgically drained 6.5 x 5.1 cm left sub-diaphragmatic fluid collection. Request is now received for CT guided drainage of these left upper abdominal fluid collections/abscesses.   Past Medical History  Diagnosis Date  . Glaucoma   . Clotting disorder   . DVT (deep venous thrombosis)     LEFT  . GERD (gastroesophageal reflux disease)   . Hypercholesterolemia   . Peripheral neuropathy   . Essential tremor   . Diverticulosis   . Arthritis     knees and back  . Cancer     Pancreatic  . Hypertension   . Colon polyps   . Urine incontinence   . UTI (lower urinary tract infection)   . Complication of anesthesia     slow to wak up   . Tachycardia   . Shortness of breath dyspnea     with exertion   . Pulmonary embolism     Past Surgical History  Procedure Laterality Date  . Cholecystectomy    . Abdominal hysterectomy    . Knee surgery Bilateral   . [place] filter    . Back surgery    . Eus N/A 02/26/2014    Procedure: UPPER ENDOSCOPIC ULTRASOUND (EUS) LINEAR;  Surgeon: Beryle Beams, MD;  Location: [facility];  Service: Endoscopy;  Laterality: N/A;  . Fine needle aspiration N/A 02/26/2014    Procedure: FINE NEEDLE ASPIRATION (FNA) LINEAR;  Surgeon: Beryle Beams, MD;  Location: [facility];  Service: Endoscopy;  Laterality: N/A;  . Breast lumpectomy Right   . Spine surgery    . Xi robotic assisted laparoscopic distal pancreatectomy N/A 04/08/2014      Procedure: XI ROBOTIC ASSISTED CONVERTED LAPAROSCOPIC HAND ASSITED DISTAL PANCREATECTOMY/SPLENECTOMY WITH PARTIAL COLECTOMY;  Surgeon: [name], MD;  Location: [facility];  Service: General;  Laterality: N/A;    Allergies: Sulfa antibiotics  Medications: Prior to Admission medications   Medication Sig Start Date End Date Taking? Authorizing Provider  alum & mag hydroxide-simeth (MAALOX PLUS) 400-400-40 MG/5ML suspension Take 30 mLs by mouth every 6 (six) hours as needed for indigestion.   Yes Historical Provider, MD  atorvastatin (LIPITOR) 40 MG tablet TAKE 1 TABLET BY MOUTH DAILY 03/28/14  Yes Mancel Bale, PA-C  brimonidine-timolol (COMBIGAN) 0.2-0.5 % ophthalmic solution Place 1 drop into the left eye every 12 (twelve) hours.   Yes Historical Provider, MD  CARTIA XT 120 MG 24 hr capsule TAKE ONE CAPSULE BY MOUTH DAILY Patient taking differently: TAKE ONE CAPSULE BY MOUTH DAILY IN THE EVENING 03/28/14  Yes Mancel Bale, PA-C  enoxaparin (LOVENOX) 100 MG/ML injection Inject 1 mL (100 mg total) into the skin every 12 (twelve) hours. 02/16/14  Yes Truitt Merle, MD  gabapentin (NEURONTIN) 300 MG capsule TAKE ONE CAPSULE BY MOUTH AT BEDTIME; AFTER 7 DAYS, MAY INCREASE TO 2 CAPSULES AT BEDTIME 03/28/14  Yes Mancel Bale, PA-C  HYDROcodone-acetaminophen (NORCO/VICODIN) 5-325 MG per tablet Take 1 tablet by mouth every 6 (six)  hours as needed for moderate pain. Patient taking differently: Take 2 tablets by mouth every 4 (four) hours as needed for moderate pain.  12/31/13  Yes Wardell Honour, MD  latanoprost (XALATAN) 0.005 % ophthalmic solution Place 1 drop into both eyes at bedtime.   Yes Historical Provider, MD  pantoprazole (PROTONIX) 40 MG tablet TAKE 1 TABLET BY MOUTH DAILY 03/28/14  Yes Leandrew Koyanagi, MD  primidone (MYSOLINE) 50 MG tablet Take 50 mg by mouth daily. Patient takes at nite   Yes Historical Provider, MD  propranolol ER (INDERAL LA) 120 MG 24 hr capsule TAKE ONE CAPSULE BY MOUTH  DAILY Patient taking differently: TAKE ONE CAPSULE BY MOUTH DAILY AT BEDTIME 03/28/14  Yes Mancel Bale, PA-C  acetaminophen (TYLENOL) 500 MG tablet Take 500 mg by mouth every 6 (six) hours as needed (Pain).    Historical Provider, MD  doxycycline (VIBRA-TABS) 100 MG tablet Take 1 tablet (100 mg total) by mouth 2 (two) times daily. 03/19/14   Golden Circle, FNP  traMADol (ULTRAM) 50 MG tablet Take 1 tablet (50 mg total) by mouth every 8 (eight) hours as needed. Patient not taking: Reported on 03/29/2014 03/19/14   Golden Circle, FNP     Family History  Problem Relation Age of Onset  . Diabetes Father   . Cancer Father 3    prostate  . Hyperlipidemia Father   . Cataracts Maternal Grandmother   . Hypertension Mother   . Arthritis Mother   . Heart disease Mother   . Glaucoma Sister   . Cancer Sister 57    Breast  . Cancer Paternal Uncle     throat +cig  . Diabetes Paternal Grandmother     History   Social History  . Marital Status: Divorced    Spouse Name: N/A  . Number of Children: 3  . Years of Education: 14   Occupational History  . Retired    Social History Main Topics  . Smoking status: Current Every Day Smoker -- 0.50 packs/day for 45 years    Types: Cigarettes  . Smokeless tobacco: Never Used  . Alcohol Use: 0.0 oz/week    0 Standard drinks or equivalent per week     Comment: occ- glass of brandy or wine socially   . Drug Use: No  . Sexual Activity: Not on file   Other Topics Concern  . None   Social History Narrative   Divorced, 3 children   Right handed   Associates degree   3 per week   Fun: Sleep, watch tv, reading   Denies any religious beliefs that effect health care      Review of Systems: A 12 point ROS discussed and pertinent positives are indicated in the HPI above.  All other systems are negative.  Review of Systems see above; patient denies acute chest pain, she has some dyspnea with exertion.   Vital Signs: BP 124/59 mmHg  Pulse  89  Temp(Src) 98.4 F (36.9 C) (Oral)  Resp 14  Ht 5' 9.5" (1.765 m)  Wt 214 lb 8.1 oz (97.3 kg)  BMI 31.23 kg/m2  SpO2 94%  Physical Exam patient awake, some minimal drowsiness secondary to pain med use; diminished breath sounds left base, right clear; heart with regular rate and rhythm; abdomen soft, mildly distended, few bowel sounds, left greater than right abdominal tenderness to palpation , left surgical drain in place with purulent green colored fluid and JP bulb  Mallampati Score:  Imaging: Ct Abdomen Pelvis W Contrast  04/15/2014   CLINICAL DATA:  Abdominal cavity abscess.  EXAM: CT ABDOMEN AND PELVIS WITH CONTRAST  TECHNIQUE: Multidetector CT imaging of the abdomen and pelvis was performed using the standard protocol following bolus administration of intravenous contrast.  CONTRAST:  130mL OMNIPAQUE IOHEXOL 300 MG/ML  SOLN  COMPARISON:  CT scan of December 22, 2013. MRI scan of January 05, 2014.  FINDINGS: Mild degenerative disc disease is noted at L3-4 and L4-5. Moderate left pleural effusion is noted with adjacent atelectasis of the left lower lobe.  Status post cholecystectomy. Stable lobulated cyst seen in inferior tip of right hepatic lobe. Status post splenectomy and distal pancreatectomy. Fluid collection measuring 5.9 x 3.1 cm is noted superior and medial to to upper pole of left kidney. 6.5 x 5.1 cm subdiaphragmatic fluid collection is noted as well with surgical drain within it. No hydronephrosis or renal obstruction is noted. Adrenal glands appear normal. 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 No evidence of abdominal aortic aneurysm is noted. There is no evidence of bowel obstruction. Urinary bladder is decompressed secondary to Foley catheter. Small amount of free fluid is noted in the dependent portion of the pelvis. No significant adenopathy is noted. New multiple rounded  densities are noted in the subcutaneous tissues of the lower abdomen bilaterally most consistent with injection sites.  IMPRESSION: Status post splenectomy and distal pancreatectomy. 5.9 x 3.1 cm fluid collection is seen superior and medial to upper pole of left kidney as well as 6.5 x 5.1 cm left sub diaphoretic fluid collection with surgical drain present within it. These are concerning for probable abscesses.  Moderate left pleural effusion is noted with adjacent atelectasis of the left lower lobe.   Electronically Signed   By: Marijo Conception, M.D.   On: 04/15/2014 17:16    Labs:  CBC:  Recent Labs  04/12/14 0403 04/13/14 0815 04/14/14 0505 04/15/14 0504  WBC 16.0* 12.4* 15.8* 19.1*  HGB 8.1* 7.2* 7.6* 7.3*  HCT 25.0* 22.3* 22.9* 22.0*  PLT 219 260 312 320    COAGS:  Recent Labs  04/01/14 1400 04/08/14 0630 04/09/14 0404  INR 1.11 1.06 1.12  APTT  --  40* 33    BMP:  Recent Labs  04/12/14 0403 04/13/14 0815 04/14/14 0505 04/15/14 0504  NA 137 134* 136 135  K 3.6 3.3* 3.5 3.7  CL 103 99 97 98  CO2 27 27 27 27   GLUCOSE 143* 150* 144* 127*  BUN <5* <5* <5* 5*  CALCIUM 7.8* 8.1* 8.4 8.2*  CREATININE 0.62 0.48* 0.55 0.55  GFRNONAA >90 >90 >90 >90  GFRAA >90 >90 >90 >90    LIVER FUNCTION TESTS:  Recent Labs  05/03/13 1306 05/12/13 1453 01/04/14 1107 02/16/14 1443 04/01/14 1400  BILITOT 0.6  --  0.5 0.42 0.7  AST 26  --  29 24 35  ALT 8  --  10 10 21   ALKPHOS 109  --  80 96 75  PROT 7.2 6.9 6.8 7.2 7.6  ALBUMIN 3.8  --  3.6 3.6 3.9    TUMOR MARKERS: No results for input(s): AFPTM, CEA, CA199, CHROMGRNA in the last 8760 hours.  Assessment and Plan: [name] is a 69 y.o. 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  Patient has had rising white blood cell count and intermittent fevers as well as persistent abdominal  pain and nausea. Follow up CT abdomen on 04/15/2014 revealed a 5.9 x 3.1 cm fluid collection superior and medial to the upper pole of the left kidney as well as a surgically drained 6.5 x 5.1 cm left sub-diaphragmatic fluid collection. Request is now received for CT guided drainage of these left upper abdominal fluidcollections/abscesses. Details/risks of procedure, including but not limited to internal bleeding, infection, injury to adjacent structures, need for additional surgery discussed with patient and patient's daughter with their apparent understanding and consent. Procedure scheduled tentatively for later today.      Signed: Autumn Messing 04/16/2014, 10:40 AM   I spent a total of   20 minutes  in face to face in clinical consultation, greater than 50% of which was counseling/coordinating care for CT guided left abdominal abscess drainage

## 2014-04-16 NOTE — Progress Notes (Signed)
Clinical Social Work Department CLINICAL SOCIAL WORK PLACEMENT NOTE 04/16/2014  Patient:  [name], [name]  Account Number:  [id number] [place] date:  04/08/2014  Clinical Social Worker:  Werner Lean, LCSW  Date/time:  04/16/2014 01:17 PM  Clinical Social Work is seeking post-discharge placement for this patient at the following level of care:   SKILLED NURSING   (*CSW will update this form in Epic as items are completed)   04/16/2014  Patient/family provided with [facility] Department of Clinical Social Work's list of facilities offering this level of care within the geographic area requested by the patient (or if unable, by the patient's family).  04/16/2014  Patient/family informed of their freedom to choose among providers that offer the needed level of care, that participate in Medicare, Medicaid or managed care program needed by the patient, have an available bed and are willing to accept the patient.    Patient/family informed of [facility]' ownership interest in [facility], as well as of the fact that they are under no obligation to receive care at this facility.  PASARR submitted to EDS on 04/16/2014 PASARR number received on 04/16/2014  FL2 transmitted to all facilities in geographic area requested by pt/family on  04/16/2014 FL2 transmitted to all facilities within larger geographic area on   Patient informed that his/her managed care company has contracts with or will negotiate with  certain facilities, including the following:     Patient/family informed of bed offers received:   Patient chooses bed at  Physician recommends and patient chooses bed at    Patient to be transferred to  on   Patient to be transferred to facility by  Patient and family notified of transfer on  Name of family member notified:    The following physician request were entered in Epic:   Additional Comments:  Werner Lean LCSW [phone]

## 2014-04-16 NOTE — Progress Notes (Signed)
I asked pt if she was ready to go for a walk or sit in the chair. She stated "at this time of night, it's too late." I explained to her that is was important to for her to get OOB to avoid complications, but she still declined. Will continue to monitor.

## 2014-04-16 NOTE — Progress Notes (Signed)
Clinical Social Work Department BRIEF PSYCHOSOCIAL ASSESSMENT 04/16/2014  Patient:  [name], [name]     Account Number:  [id number]     [place] date:  04/08/2014  Clinical Social Worker:  Lacie Scotts  Date/Time:  04/16/2014 12:56 PM  Referred by:  CSW  Date Referred:  04/16/2014 Referred for  SNF Placement   Other Referral:   Interview type:  Patient Other interview type:    PSYCHOSOCIAL DATA Living Status:  FAMILY Admitted from facility:   Level of care:   Primary support name:  Christean Grief Primary support relationship to patient:  CHILD, ADULT Degree of support available:   supportive    CURRENT CONCERNS Current Concerns  Post-Acute Placement   Other Concerns:    SOCIAL WORK ASSESSMENT / PLAN Pt is a 69 yr old female living at home prior to hospitalization. CSW met with pt to assist with d/c planning. PN reviewed prior to visit. Pt has a new dx of CA. PT recommends ST Rehab following hospital d/c. Pt is willing to consider this option. She has given CSW permission to speak with her daughter regarding d/c planning. SNF search has been initiated and bed offers pending. CSW will continue to follow to assist with d/c planning.   Assessment/plan status:  Psychosocial Support/Ongoing Assessment of Needs Other assessment/ plan:   Information/referral to community resources:   None at this time.    PATIENT'S/FAMILY'S RESPONSE TO PLAN OF CARE: Pt feels weak. New dx of CA is upsetting. Opportunity to verbalize feelings provided but pt did not feel like talking. " I'm tired. I don't feel well. " Pt does feel she would have difficulty at home feeling the way she does now. " I might need rehab. You can talk to my daughter. " CSW has left a message for pt's daughter to contact CSW.   Werner Lean LCSW [phone]

## 2014-04-16 NOTE — Progress Notes (Signed)
Patient ID: [name], female   DOB: 1945/02/05, 69 y.o.   MRN: [id number] [facility] Progress Note:   8 Days Post-Op   Subjective: + Flatus, bm.  1 x episode emesis.   RNs having difficulty getting patient to get out of bed.    Objective: Vital signs in last 24 hours: Temp:  [98.2 F (36.8 C)-99.4 F (37.4 C)] 98.4 F (36.9 C) (03/18 0615) Pulse Rate:  [86-103] 89 (03/18 0615) Resp:  [17-21] 18 (03/18 0615) BP: (98-147)/(54-77) 124/59 mmHg (03/18 0615) SpO2:  [94 %-99 %] 99 % (03/18 0615)  Intake/Output from previous day: 03/17 0701 - 03/18 0700 In: 1840 [P.O.:540; I.V.:1200; IV Piggyback:100] Out: 2656 [Urine:2650; Emesis/NG output:2; Drains:4] Intake/Output this shift:    Physical Exam:  General:  A&O x3 CV RR&R Pulm:  No distress, easy breathing. Abd:  Soft, still sl distended, non tender. Drain with some purulent material in the tubing.   No erythema or drainage on midline wound.  Lab Results:  Results for orders placed or performed during the hospital encounter of 04/08/14 (from the past 48 hour(s))  Urinalysis, Routine w reflex microscopic     Status: Abnormal   Collection Time: 04/14/14  8:33 AM  Result Value Ref Range   Color, Urine YELLOW YELLOW   APPearance CLEAR CLEAR   Specific Gravity, Urine 1.009 1.005 - 1.030   pH 7.0 5.0 - 8.0   Glucose, UA NEGATIVE NEGATIVE mg/dL   Hgb urine dipstick SMALL (A) NEGATIVE   Bilirubin Urine NEGATIVE NEGATIVE   Ketones, ur NEGATIVE NEGATIVE mg/dL   Protein, ur NEGATIVE NEGATIVE mg/dL   Urobilinogen, UA 0.2 0.0 - 1.0 mg/dL   Nitrite NEGATIVE NEGATIVE   Leukocytes, UA SMALL (A) NEGATIVE  Urine microscopic-add on     Status: Abnormal   Collection Time: 04/14/14  8:33 AM  Result Value Ref Range   Squamous Epithelial / LPF RARE RARE   WBC, UA 3-6 <3 WBC/hpf   RBC / HPF 0-2 <3 RBC/hpf   Bacteria, UA RARE RARE   Casts HYALINE CASTS (A) NEGATIVE   Urine-Other MUCOUS PRESENT   CBC with  Differential/Platelet     Status: Abnormal   Collection Time: 04/15/14  5:04 AM  Result Value Ref Range   WBC 19.1 (H) 4.0 - 10.5 K/uL   RBC 2.41 (L) 3.87 - 5.11 MIL/uL   Hemoglobin 7.3 (L) 12.0 - 15.0 g/dL   HCT 22.0 (L) 36.0 - 46.0 %   MCV 91.3 78.0 - 100.0 fL   MCH 30.3 26.0 - 34.0 pg   MCHC 33.2 30.0 - 36.0 g/dL   RDW 13.3 11.5 - 15.5 %   Platelets 320 150 - 400 K/uL   Neutrophils Relative % 79 (H) 43 - 77 %   Lymphocytes Relative 9 (L) 12 - 46 %   Monocytes Relative 12 3 - 12 %   Eosinophils Relative 0 0 - 5 %   Basophils Relative 0 0 - 1 %   Neutro Abs 15.1 (H) 1.7 - 7.7 K/uL   Lymphs Abs 1.7 0.7 - 4.0 K/uL   Monocytes Absolute 2.3 (H) 0.1 - 1.0 K/uL   Eosinophils Absolute 0.0 0.0 - 0.7 K/uL   Basophils Absolute 0.0 0.0 - 0.1 K/uL   RBC Morphology POLYCHROMASIA PRESENT    WBC Morphology VACUOLATED NEUTROPHILS    Smear Review LARGE PLATELETS PRESENT   Basic metabolic panel     Status: Abnormal   Collection Time: 04/15/14  5:04 AM  Result Value Ref Range   Sodium 135 135 - 145 mmol/L   Potassium 3.7 3.5 - 5.1 mmol/L   Chloride 98 96 - 112 mmol/L   CO2 27 19 - 32 mmol/L   Glucose, Bld 127 (H) 70 - 99 mg/dL   BUN 5 (L) 6 - 23 mg/dL   Creatinine, Ser 0.55 0.50 - 1.10 mg/dL   Calcium 8.2 (L) 8.4 - 10.5 mg/dL   GFR calc non Af Amer >90 >90 mL/min   GFR calc Af Amer >90 >90 mL/min    Comment: (NOTE) The eGFR has been calculated using the CKD EPI equation. This calculation has not been validated in all clinical situations. eGFR's persistently <90 mL/min signify possible Chronic Kidney Disease.    Anion gap 10 5 - 15    Radiology/Results: Ct Abdomen Pelvis W Contrast  04/15/2014   CLINICAL DATA:  Abdominal cavity abscess.  EXAM: CT ABDOMEN AND PELVIS WITH CONTRAST  TECHNIQUE: Multidetector CT imaging of the abdomen and pelvis was performed using the standard protocol following bolus administration of intravenous contrast.  CONTRAST:  144m OMNIPAQUE IOHEXOL 300 MG/ML   SOLN  COMPARISON:  CT scan of December 22, 2013. MRI scan of January 05, 2014.  FINDINGS: Mild degenerative disc disease is noted at L3-4 and L4-5. Moderate left pleural effusion is noted with adjacent atelectasis of the left lower lobe.  Status post cholecystectomy. Stable lobulated cyst seen in inferior tip of right hepatic lobe. Status post splenectomy and distal pancreatectomy. Fluid collection measuring 5.9 x 3.1 cm is noted superior and medial to to upper pole of left kidney. 6.5 x 5.1 cm subdiaphragmatic fluid collection is noted as well with surgical drain within it. No hydronephrosis or renal obstruction is noted. Adrenal glands appear normal. 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 No evidence of abdominal aortic aneurysm is noted. There is no evidence of bowel obstruction. Urinary bladder is decompressed secondary to Foley catheter. Small amount of free fluid is noted in the dependent portion of the pelvis. No significant adenopathy is noted. New multiple rounded densities are noted in the subcutaneous tissues of the lower abdomen bilaterally most consistent with injection sites.  IMPRESSION: Status post splenectomy and distal pancreatectomy. 5.9 x 3.1 cm fluid collection is seen superior and medial to upper pole of left kidney as well as 6.5 x 5.1 cm left sub diaphoretic fluid collection with surgical drain present within it. These are concerning for probable abscesses.  Moderate left pleural effusion is noted with adjacent atelectasis of the left lower lobe.   Electronically Signed   By: JMarijo Conception M.D.   On: 04/15/2014 17:16    Anti-infectives: Anti-infectives    Start     Dose/Rate Route Frequency Ordered Stop   04/15/14 1800  vancomycin (VANCOCIN) 1,250 mg in sodium chloride 0.9 % 250 mL IVPB     1,250 mg 166.7 mL/hr over 90 Minutes Intravenous Every 12 hours 04/15/14 0801     04/15/14 1500   piperacillin-tazobactam (ZOSYN) IVPB 3.375 g     3.375 g 12.5 mL/hr over 240 Minutes Intravenous 3 times per day 04/15/14 0801     04/15/14 0900  vancomycin (VANCOCIN) 1,500 mg in sodium chloride 0.9 % 500 mL IVPB     1,500 mg 250 mL/hr over 120 Minutes Intravenous  Once 04/15/14 0801 04/15/14 1231   04/15/14 0830  piperacillin-tazobactam (ZOSYN) IVPB 3.375 g  3.375 g 100 mL/hr over 30 Minutes Intravenous  Once 04/15/14 0801 04/15/14 0928   04/08/14 1600  cefoTEtan (CEFOTAN) 2 g in dextrose 5 % 50 mL IVPB     2 g 100 mL/hr over 30 Minutes Intravenous Every 12 hours 04/08/14 1522 04/09/14 0530   04/08/14 0537  ceFAZolin (ANCEF) IVPB 2 g/50 mL premix     2 g 100 mL/hr over 30 Minutes Intravenous On call to O.R. 04/08/14 0537 04/08/14 0759      Assessment/Plan: Problem List: Patient Active Problem List   Diagnosis Date Noted  . 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 Pancreatic adenocarcinoma 03/22/2014  . Rash of back 03/19/2014  . Family history of malignant neoplasm of breast 02/26/2014  . Family history of prostate cancer 02/26/2014  . DVT (deep venous thrombosis) 01/11/2014  . Pancreatic mass on CT 11/15 12/25/2013  . Diverticular disease 12/22/2013  . History of DVT (deep vein thrombosis) 03/01/2011  . Hx pulmonary embolism 03/01/2011  . HTN (hypertension) 03/01/2011  . Hypercholesterolemia 03/01/2011  . Reflux 03/01/2011    ABL anemia IR perc drain hopefully today. Lovenox, oral narcotics, advance diet as tolerated after drain.   Ambulate.      LOS: 8 days    [facility]  [phone]  04/16/2014 7:18 AM

## 2014-04-16 NOTE — Procedures (Signed)
Technically successful CT guided drainage catheter placement into left upper abd quadrant yielding 20 cc of purulent material.  Samples sent to laboratory.  No immediate post procedural complications.

## 2014-04-17 ENCOUNTER — Inpatient Hospital Stay (HOSPITAL_COMMUNITY): Payer: Medicare Other

## 2014-04-17 LAB — VANCOMYCIN, TROUGH
VANCOMYCIN TR: 33.3 ug/mL — AB (ref 10.0–20.0)
Vancomycin Tr: 38.7 ug/mL (ref 10.0–20.0)

## 2014-04-17 LAB — CREATININE, SERUM
Creatinine, Ser: 1.84 mg/dL — ABNORMAL HIGH (ref 0.50–1.10)
GFR calc Af Amer: 32 mL/min — ABNORMAL LOW (ref >90)
GFR calc non Af Amer: 27 mL/min — ABNORMAL LOW (ref >90)

## 2014-04-17 NOTE — Progress Notes (Signed)
Pt Daughter in law asking questions about pt going to a facility and wanting to speak with social worker. Pt provided consent to talk with Daughter In Law, [name].

## 2014-04-17 NOTE — Progress Notes (Signed)
ANTIBIOTIC CONSULT NOTE - FOLLOW UP  Pharmacy Consult for vancomycin Indication: possible abscess  Allergies  Allergen Reactions  . Sulfa Antibiotics Swelling    Patient Measurements: Height: 5' 9.5" (176.5 cm) Weight: 214 lb 8.1 oz (97.3 kg) IBW/kg (Calculated) : 67.35 Adjusted Body Weight:   Vital Signs: Temp: 98.8 F (37.1 C) (03/19 0601) Temp Source: Oral (03/19 0601) BP: 104/56 mmHg (03/19 0601) Pulse Rate: 68 (03/19 0601) Intake/Output from previous day: 03/18 0701 - 03/19 0700 In: 5747 [P.O.:120; I.V.:1200; IV Piggyback:50] Out: 3403 [Urine:1025; Drains:310] Intake/Output from this shift: Total I/O In: 605 [I.V.:600; Other:5] Out: 7096 [Urine:850; Drains:240]  Labs:  Recent Labs  04/15/14 0504  WBC 19.1*  HGB 7.3*  PLT 320  CREATININE 0.55   Estimated Creatinine Clearance: 85.5 mL/min (by C-G formula based on Cr of 0.55).  Recent Labs  04/17/14 0445  VANCOTROUGH 38.7*     Microbiology: No results found for this or any previous visit (from the past 720 hour(s)).  Anti-infectives    Start     Dose/Rate Route Frequency Ordered Stop   04/15/14 1800  vancomycin (VANCOCIN) 1,250 mg in sodium chloride 0.9 % 250 mL IVPB  Status:  Discontinued     1,250 mg 166.7 mL/hr over 90 Minutes Intravenous Every 12 hours 04/15/14 0801 04/17/14 0643   04/15/14 1500  piperacillin-tazobactam (ZOSYN) IVPB 3.375 g     3.375 g 12.5 mL/hr over 240 Minutes Intravenous 3 times per day 04/15/14 0801     04/15/14 0900  vancomycin (VANCOCIN) 1,500 mg in sodium chloride 0.9 % 500 mL IVPB     1,500 mg 250 mL/hr over 120 Minutes Intravenous  Once 04/15/14 0801 04/15/14 1231   04/15/14 0830  piperacillin-tazobactam (ZOSYN) IVPB 3.375 g     3.375 g 100 mL/hr over 30 Minutes Intravenous  Once 04/15/14 0801 04/15/14 0928   04/08/14 1600  cefoTEtan (CEFOTAN) 2 g in dextrose 5 % 50 mL IVPB     2 g 100 mL/hr over 30 Minutes Intravenous Every 12 hours 04/08/14 1522 04/09/14 0530   04/08/14 0537  ceFAZolin (ANCEF) IVPB 2 g/50 mL premix     2 g 100 mL/hr over 30 Minutes Intravenous On call to O.R. 04/08/14 0537 04/08/14 0759      Assessment: Patient with high vancomycin level.  Prior doses charted correctly.   Goal of Therapy:  Vancomycin trough level 15-20 mcg/ml  Plan:  Measure antibiotic drug levels at steady state Follow up culture results recheck vancomycin level at [address], [place] 04/17/2014,6:57 AM

## 2014-04-17 NOTE — Progress Notes (Signed)
Patient ID: [name], female   DOB: Dec 02, 1945, 69 y.o.   MRN: [id number] 9 Days Post-Op  Subjective: No major complaints. Feels somewhat better than yesterday. She was short of breath yesterday and this is better. Abdominal pain is moderate and controlled with medications. No nausea. Tolerating clear liquids.  Objective: Vital signs in last 24 hours: Temp:  [97.6 F (36.4 C)-98.8 F (37.1 C)] 98.8 F (37.1 C) (03/19 0601) Pulse Rate:  [68-101] 68 (03/19 0601) Resp:  [9-18] 12 (03/19 0755) BP: (94-126)/(36-73) 104/56 mmHg (03/19 0601) SpO2:  [94 %-100 %] 97 % (03/19 0755) FiO2 (%):  [30 %-34 %] 33 % (03/19 0449) Last BM Date: 04/15/14  Intake/Output from previous day: 03/18 0701 - 03/19 0700 In: 5176 [P.O.:120; I.V.:1200; IV Piggyback:50] Out: 1607 [Urine:1025; Drains:310] Intake/Output this shift:    General appearance: alert, cooperative and no distress Resp: clear bilaterally anteriorly without wheezing or increased work of breathing GI: mild to moderate left upper quadrant tenderness. Drains in place with fairly high output cloudy serous drainage Incision/Wound: nno erythema or drainage  Lab Results:   Recent Labs  04/15/14 0504  WBC 19.1*  HGB 7.3*  HCT 22.0*  PLT 320   BMET  Recent Labs  04/15/14 0504 04/17/14 0445  NA 135  --   K 3.7  --   CL 98  --   CO2 27  --   GLUCOSE 127*  --   BUN 5*  --   CREATININE 0.55 1.84*  CALCIUM 8.2*  --      Studies/Results: Dg Chest 1 View  04/17/2014   CLINICAL DATA:  Tachycardia and dyspnea  EXAM: CHEST  1 VIEW  COMPARISON:  02/09/2014 CT  FINDINGS: There is a pigtail catheter in the left subphrenic region. There is dense consolidation in the left lung base and probable small left pleural effusion. The right lung is clear. There is moderate cardiomegaly. There is mild vascular prominence, without overt congestive heart failure.  IMPRESSION: Dense consolidation in the left base and small left pleural effusion.    Electronically Signed   By: Andreas Newport M.D.   On: 04/17/2014 05:10   Ct Abdomen Pelvis W Contrast  04/15/2014   CLINICAL DATA:  Abdominal cavity abscess.  EXAM: CT ABDOMEN AND PELVIS WITH CONTRAST  TECHNIQUE: Multidetector CT imaging of the abdomen and pelvis was performed using the standard protocol following bolus administration of intravenous contrast.  CONTRAST:  11mL OMNIPAQUE IOHEXOL 300 MG/ML  SOLN  COMPARISON:  CT scan of December 22, 2013. MRI scan of January 05, 2014.  FINDINGS: Mild degenerative disc disease is noted at L3-4 and L4-5. Moderate left pleural effusion is noted with adjacent atelectasis of the left lower lobe.  Status post cholecystectomy. Stable lobulated cyst seen in inferior tip of right hepatic lobe. Status post splenectomy and distal pancreatectomy. Fluid collection measuring 5.9 x 3.1 cm is noted superior and medial to to upper pole of left kidney. 6.5 x 5.1 cm subdiaphragmatic fluid collection is noted as well with surgical drain within it. No hydronephrosis or renal obstruction is noted. Adrenal glands appear normal. 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 No evidence of abdominal aortic aneurysm is noted. There is no evidence of bowel obstruction. Urinary bladder is decompressed secondary to Foley catheter. Small amount of free fluid is noted in the dependent portion of the pelvis. No significant adenopathy is noted. New  multiple rounded densities are noted in the subcutaneous tissues of the lower abdomen bilaterally most consistent with injection sites.  IMPRESSION: Status post splenectomy and distal pancreatectomy. 5.9 x 3.1 cm fluid collection is seen superior and medial to upper pole of left kidney as well as 6.5 x 5.1 cm left sub diaphoretic fluid collection with surgical drain present within it. These are concerning for probable abscesses.  Moderate left pleural effusion  is noted with adjacent atelectasis of the left lower lobe.   Electronically Signed   By: Marijo Conception, M.D.   On: 04/15/2014 17:16   Ct Image Guided Drainage By Percutaneous Catheter  04/16/2014   INDICATION: History of pancreatic cancer, post splenectomy and distal pancreatectomy, now with elevated white blood cell count an fluid collection within the perioperative site worrisome for intra-abdominal abscess. Please perform CT-guided percutaneous drainage catheter placement.  EXAM: CT IMAGE GUIDED DRAINAGE BY PERCUTANEOUS CATHETER  COMPARISON:  CT abdomen pelvis - 04/15/2014  MEDICATIONS: The patient is currently admitted to the hospital and receiving intravenous antibiotics. The antibiotics were administered within an appropriate time frame prior to the initiation of the procedure.  ANESTHESIA/SEDATION: Versed 1 mg IV  Total Moderate Sedation time  20 minutes  CONTRAST:  None  COMPLICATIONS: None immediate  PROCEDURE: Informed written consent was obtained from the patient after a discussion of the risks, benefits and alternatives to treatment. The patient was placed supine on the CT gantry and a pre procedural CT was performed re-demonstrating the known dominant abscess/fluid collection within the left upper abdomen, anterior to the left kidney with dominant component measuring approximately 4.3 x 2.6 cm (image 13, series 2). The procedure was planned. A timeout was performed prior to the initiation of the procedure.  The skin overlying the left upper lateral abdomen was prepped and draped in the usual sterile fashion. The overlying soft tissues were anesthetized with 1% lidocaine with epinephrine. Appropriate trajectory was planned with the use of a 22 gauge spinal needle. An 18 gauge trocar needle was advanced into the abscess/fluid collection and a short Amplatz super stiff wire was coiled within the collection. Appropriate positioning was confirmed with a limited CT scan. The tract was serially dilated  allowing placement of a 10 [place] all-purpose drainage catheter. Appropriate positioning was confirmed with a limited postprocedural CT scan.  Following percutaneous drainage catheter placement, approximately 20 cc of purulent fluid was aspirated. The tube was connected to a JP bulb and sutured in place. A dressing was placed. The patient tolerated the procedure well without immediate post procedural complication.  IMPRESSION: Successful CT guided placement of a 10 French all purpose drain catheter into the dominant abscess/fluid collection within the left upper abdominal quadrant, anterior to the left kidney with aspiration of 25 mL of purulent fluid. Samples were sent to the laboratory as requested by the ordering clinical team.   Electronically Signed   By: Sandi Mariscal M.D.   On: 04/16/2014 16:11    Anti-infectives: Anti-infectives    Start     Dose/Rate Route Frequency Ordered Stop   04/15/14 1800  vancomycin (VANCOCIN) 1,250 mg in sodium chloride 0.9 % 250 mL IVPB  Status:  Discontinued     1,250 mg 166.7 mL/hr over 90 Minutes Intravenous Every 12 hours 04/15/14 0801 04/17/14 0643   04/15/14 1500  piperacillin-tazobactam (ZOSYN) IVPB 3.375 g     3.375 g 12.5 mL/hr over 240 Minutes Intravenous 3 times per day 04/15/14 0801     04/15/14 0900  vancomycin (  VANCOCIN) 1,500 mg in sodium chloride 0.9 % 500 mL IVPB     1,500 mg 250 mL/hr over 120 Minutes Intravenous  Once 04/15/14 0801 04/15/14 1231   04/15/14 0830  piperacillin-tazobactam (ZOSYN) IVPB 3.375 g     3.375 g 100 mL/hr over 30 Minutes Intravenous  Once 04/15/14 0801 04/15/14 0928   04/08/14 1600  cefoTEtan (CEFOTAN) 2 g in dextrose 5 % 50 mL IVPB     2 g 100 mL/hr over 30 Minutes Intravenous Every 12 hours 04/08/14 1522 04/09/14 0530   04/08/14 0537  ceFAZolin (ANCEF) IVPB 2 g/50 mL premix     2 g 100 mL/hr over 30 Minutes Intravenous On call to O.R. 04/08/14 0537 04/08/14 0759      Assessment/Plan: s/p Procedure(s): XI ROBOTIC  ASSISTED CONVERTED LAPAROSCOPIC HAND ASSITED DISTAL PANCREATECTOMY/SPLENECTOMY WITH PARTIAL COLECTOMY Postoperative left upper quadrant and subphrenic probably infected fluid collection status post percutaneous drainage Left lower lobe atelectasis Overall seems stable today. Pulmonary toilet and ambulation encouraged. Physical therapy on board. Continue antibiotics. Tolerating clear liquids and will advance to full liquid diet Some increase in creatinine. Urine output was low early yesterday but good overnight. Repeat labs in a.m.    LOS: 9 days    Zriyah Kopplin T 04/17/2014

## 2014-04-17 NOTE — Progress Notes (Signed)
04/17/14 1300  Wasted 64mL of PCA Morphine with Ashby Dawes, RN in sink.

## 2014-04-17 NOTE — Progress Notes (Signed)
Pt encouraged to at least sit on the side of the bed, pt stated "not right now maybe tomorrow".  Pt educated on importance of movement and not just sitting in bed.  Pt verbalized understanding and said she would think about it.

## 2014-04-17 NOTE — Progress Notes (Signed)
Pharmacy: Re- vancomycin  34 yoF with pancreatic adenoCa, underwent pancreatectomy, splenectomy, and partial colectomy 3/10. Required mech ventilation post-op. Now transferred to medical floor, bowel function slowly returning but with new temps and elevated WBCs, and some purulent drainage at Sgx site. CT abdomen on 3/17 with suspicion for abscesses - s/p drain placement on 3/18.  3/17 >> vancomycin >> 3/17 >> Zosyn >>   Tmax: 100.5 on 3/15, afeb since WBCs: increasing (last checked 3/17) Renal: SCr big increase to 1.84, CrCl 37 (N33) UOP 0.4 ml/kg/hr  No cx obtained to date but plan perc drain placement 3/18 3/18 abscess: pending  Drug level / dose changes info: 3/19: VT @ 0500 = 38.7 on 1250 q12h - HOLD vanc 3/19: VT @ 1800 = 33.3   Plan: - will continue to hold vancomycin for now - recheck another random vancomycin level at [place] on 3/20 - pharmacy will f/u and resume vancomycin if/when appropriate  Dia Sitter, PharmD, BCPS

## 2014-04-18 LAB — CBC
HCT: 22.7 % — ABNORMAL LOW (ref 36.0–46.0)
Hemoglobin: 7.4 g/dL — ABNORMAL LOW (ref 12.0–15.0)
MCH: 30.1 pg (ref 26.0–34.0)
MCHC: 32.6 g/dL (ref 30.0–36.0)
MCV: 92.3 fL (ref 78.0–100.0)
PLATELETS: 501 10*3/uL — AB (ref 150–400)
RBC: 2.46 MIL/uL — ABNORMAL LOW (ref 3.87–5.11)
RDW: 13.7 % (ref 11.5–15.5)
WBC: 20 10*3/uL — AB (ref 4.0–10.5)

## 2014-04-18 LAB — BASIC METABOLIC PANEL
ANION GAP: 14 (ref 5–15)
BUN: 15 mg/dL (ref 6–23)
CHLORIDE: 98 mmol/L (ref 96–112)
CO2: 23 mmol/L (ref 19–32)
CREATININE: 2.59 mg/dL — AB (ref 0.50–1.10)
Calcium: 8.3 mg/dL — ABNORMAL LOW (ref 8.4–10.5)
GFR calc Af Amer: 21 mL/min — ABNORMAL LOW (ref >90)
GFR, EST NON AFRICAN AMERICAN: 18 mL/min — AB (ref >90)
GLUCOSE: 122 mg/dL — AB (ref 70–99)
Potassium: 3.8 mmol/L (ref 3.5–5.1)
Sodium: 135 mmol/L (ref 135–145)

## 2014-04-18 LAB — VANCOMYCIN, RANDOM: Vancomycin Rm: 28.4 ug/mL

## 2014-04-18 MED ORDER — SODIUM CHLORIDE 0.9 % IJ SOLN
10.0000 mL | INTRAMUSCULAR | Status: DC | PRN
  Administered 2014-04-20 – 2014-04-21 (×3): 10 mL
  Administered 2014-04-22 (×2): 20 mL
  Administered 2014-04-22 – 2014-04-24 (×4): 10 mL
  Administered 2014-04-24: 20 mL
  Administered 2014-04-24 – 2014-04-28 (×8): 10 mL
  Administered 2014-04-29: 20 mL
  Administered 2014-04-29: 10 mL
  Administered 2014-04-29: 20 mL
  Administered 2014-04-30: 10 mL
  Administered 2014-05-02: 20 mL
  Administered 2014-05-02: 10 mL
  Administered 2014-05-02: 20 mL
  Administered 2014-05-03 (×2): 10 mL
  Filled 2014-04-18 (×27): qty 40

## 2014-04-18 MED ORDER — SODIUM CHLORIDE 0.9 % IJ SOLN
10.0000 mL | Freq: Two times a day (BID) | INTRAMUSCULAR | Status: DC
  Administered 2014-04-18: 10 mL

## 2014-04-18 MED ORDER — ENOXAPARIN SODIUM 100 MG/ML ~~LOC~~ SOLN
100.0000 mg | SUBCUTANEOUS | Status: DC
  Administered 2014-04-19 – 2014-04-24 (×6): 100 mg via SUBCUTANEOUS
  Filled 2014-04-18 (×7): qty 1

## 2014-04-18 MED ORDER — SODIUM CHLORIDE 0.9 % IV BOLUS (SEPSIS)
500.0000 mL | Freq: Once | INTRAVENOUS | Status: AC
Start: 2014-04-18 — End: 2014-04-18
  Administered 2014-04-18: 500 mL via INTRAVENOUS

## 2014-04-18 NOTE — Clinical Social Work Note (Signed)
CSW received a call from RN stating that family was in pt's room and wished to speak to [place]  CSW met with pt and family and pt was confused why CSW was called.  Pt annoyed that her daughter in law got involved and had RN call CSW when her daughter was the one helping her with decisions.    Pt wants to go home and not to a SNF at discharge  Pt's daughter arrived and stated that pt and family would need to discuss further and would let CSW know whether a SNF was needed at discharge  CSW confirmed that [facility] and rehab (SNF) had said yes to pt for her rehab needs if necessary  CSW will continue to follow pt until discharge.  Dede Query, LCSW [place] Worker - Weekend Coverage cell #: [phone]

## 2014-04-18 NOTE — Progress Notes (Signed)
Peripherally Inserted Central Catheter/Midline Placement  The IV Nurse has discussed with the patient and/or persons authorized to consent for the patient, the purpose of this procedure and the potential benefits and risks involved with this procedure.  The benefits include less needle sticks, lab draws from the catheter and patient may be discharged home with the catheter.  Risks include, but not limited to, infection, bleeding, blood clot (thrombus formation), and puncture of an artery; nerve damage and irregular heat beat.  Alternatives to this procedure were also discussed.  PICC/Midline Placement Documentation  PICC / Midline Double Lumen 50/27/74 PICC Right Basilic 40 cm 0 cm (Active)  Indication for Insertion or Continuance of Line Administration of hyperosmolar/irritating solutions (i.e. TPN, Vancomycin, etc.) 04/18/2014  6:02 PM  Exposed Catheter (cm) 0 cm 04/18/2014  6:02 PM  Lumen #1 Status Flushed;Saline locked;Blood return noted 04/18/2014  6:02 PM  Lumen #2 Status Flushed;Saline locked;Blood return noted 04/18/2014  6:02 PM  Dressing Change Due 04/25/14 04/18/2014  6:02 PM       Gordan Payment 04/18/2014, 6:04 PM

## 2014-04-18 NOTE — Progress Notes (Signed)
Subjective: Pt sitting up in chair, feels a little better. S/p perc drain LUQ 3/18  Objective: Physical Exam: BP 98/53 mmHg  Pulse 92  Temp(Src) 99.1 F (37.3 C) (Other (Comment))  Resp 16  Ht 5' 9.5" (1.765 m)  Wt 214 lb 8.1 oz (97.3 kg)  BMI 31.23 kg/m2  SpO2 97% LUQ/flank drain intact, site clean, minimally tender. Output blood tinged purulent, ~250cc per day so far    Labs: CBC  Recent Labs  04/18/14 0545  WBC 20.0*  HGB 7.4*  HCT 22.7*  PLT 501*   BMET  Recent Labs  04/17/14 0445 04/18/14 0545  NA  --  135  K  --  3.8  CL  --  98  CO2  --  23  GLUCOSE  --  122*  BUN  --  15  CREATININE 1.84* 2.59*  CALCIUM  --  8.3*   LFT No results for input(s): PROT, ALBUMIN, AST, ALT, ALKPHOS, BILITOT, BILIDIR, IBILI, LIPASE in the last 72 hours. PT/INR No results for input(s): LABPROT, INR in the last 72 hours.   Studies/Results: Dg Chest 1 View  04/17/2014   CLINICAL DATA:  Tachycardia and dyspnea  EXAM: CHEST  1 VIEW  COMPARISON:  02/09/2014 CT  FINDINGS: There is a pigtail catheter in the left subphrenic region. There is dense consolidation in the left lung base and probable small left pleural effusion. The right lung is clear. There is moderate cardiomegaly. There is mild vascular prominence, without overt congestive heart failure.  IMPRESSION: Dense consolidation in the left base and small left pleural effusion.   Electronically Signed   By: Andreas Newport M.D.   On: 04/17/2014 05:10   Ct Image Guided Drainage By Percutaneous Catheter  04/16/2014   INDICATION: History of pancreatic cancer, post splenectomy and distal pancreatectomy, now with elevated white blood cell count an fluid collection within the perioperative site worrisome for intra-abdominal abscess. Please perform CT-guided percutaneous drainage catheter placement.  EXAM: CT IMAGE GUIDED DRAINAGE BY PERCUTANEOUS CATHETER  COMPARISON:  CT abdomen pelvis - 04/15/2014  MEDICATIONS: The patient is  currently admitted to the hospital and receiving intravenous antibiotics. The antibiotics were administered within an appropriate time frame prior to the initiation of the procedure.  ANESTHESIA/SEDATION: Versed 1 mg IV  Total Moderate Sedation time  20 minutes  CONTRAST:  None  COMPLICATIONS: None immediate  PROCEDURE: Informed written consent was obtained from the patient after a discussion of the risks, benefits and alternatives to treatment. The patient was placed supine on the CT gantry and a pre procedural CT was performed re-demonstrating the known dominant abscess/fluid collection within the left upper abdomen, anterior to the left kidney with dominant component measuring approximately 4.3 x 2.6 cm (image 13, series 2). The procedure was planned. A timeout was performed prior to the initiation of the procedure.  The skin overlying the left upper lateral abdomen was prepped and draped in the usual sterile fashion. The overlying soft tissues were anesthetized with 1% lidocaine with epinephrine. Appropriate trajectory was planned with the use of a 22 gauge spinal needle. An 18 gauge trocar needle was advanced into the abscess/fluid collection and a short Amplatz super stiff wire was coiled within the collection. Appropriate positioning was confirmed with a limited CT scan. The tract was serially dilated allowing placement of a 10 [place] all-purpose drainage catheter. Appropriate positioning was confirmed with a limited postprocedural CT scan.  Following percutaneous drainage catheter placement, approximately 20 cc of purulent fluid was aspirated.  The tube was connected to a JP bulb and sutured in place. A dressing was placed. The patient tolerated the procedure well without immediate post procedural complication.  IMPRESSION: Successful CT guided placement of a 10 French all purpose drain catheter into the dominant abscess/fluid collection within the left upper abdominal quadrant, anterior to the left kidney  with aspiration of 25 mL of purulent fluid. Samples were sent to the laboratory as requested by the ordering clinical team.   Electronically Signed   By: Sandi Mariscal M.D.   On: 04/16/2014 16:11    Assessment/Plan: LUQ abscess s/p perc drain Good output so far, continue TID flushes to ensure continued drain function. IR following along with [facility]    LOS: 10 days    Ascencion Dike PA-C 04/18/2014 11:22 AM

## 2014-04-18 NOTE — Progress Notes (Signed)
Patient ID: [name], female   DOB: January 14, 1946, 69 y.o.   MRN: [id number]  [facility]  POD#: 10  Subjective: Patient up in bed, alert, responsive.  Tolerating liquid diet - no nausea.  Complains about attempts at IV access and blood draws.  Objective: Vital signs in last 24 hours: Temp:  [97.6 F (36.4 C)-99.1 F (37.3 C)] 99.1 F (37.3 C) (03/20 0622) Pulse Rate:  [86-99] 88 (03/20 0622) Resp:  [12-17] 13 (03/20 0622) BP: (101-132)/(53-62) 101/59 mmHg (03/20 0622) SpO2:  [97 %-100 %] 100 % (03/20 0622) FiO2 (%):  [33 %] 33 % (03/19 2000) Last BM Date: 04/15/14  Intake/Output from previous day: 03/19 0701 - 03/20 0700 In: 1540 [P.O.:240; I.V.:1150; IV Piggyback:150] Out: 1085 [Urine:825; Drains:260] Intake/Output this shift:    Physical Exam: HEENT - sclerae clear, mucous membranes moist Neck - soft Chest - clear bilaterally Cor - RRR Abdomen - soft, obese; drains LUQ with thin yellow to greenish output; wound dry and intact; BS present  Lab Results:   Recent Labs  04/18/14 0545  WBC 20.0*  HGB 7.4*  HCT 22.7*  PLT 501*   BMET  Recent Labs  04/17/14 0445 04/18/14 0545  NA  --  135  K  --  3.8  CL  --  98  CO2  --  23  GLUCOSE  --  122*  BUN  --  15  CREATININE 1.84* 2.59*  CALCIUM  --  8.3*   PT/INR No results for input(s): LABPROT, INR in the last 72 hours. Comprehensive Metabolic Panel:    Component Value Date/Time   NA 135 04/18/2014 0545   NA 135 04/15/2014 0504   NA 140 02/16/2014 1443   K 3.8 04/18/2014 0545   K 3.7 04/15/2014 0504   K 3.8 02/16/2014 1443   CL 98 04/18/2014 0545   CL 98 04/15/2014 0504   CO2 23 04/18/2014 0545   CO2 27 04/15/2014 0504   CO2 27 02/16/2014 1443   BUN 15 04/18/2014 0545   BUN 5* 04/15/2014 0504   BUN 7.7 02/16/2014 1443   CREATININE 2.59* 04/18/2014 0545   CREATININE 1.84* 04/17/2014 0445   CREATININE 0.7 02/16/2014 1443   CREATININE 0.99 01/04/2014 1107    CREATININE 0.66 05/03/2013 1306   GLUCOSE 122* 04/18/2014 0545   GLUCOSE 127* 04/15/2014 0504   GLUCOSE 97 02/16/2014 1443   CALCIUM 8.3* 04/18/2014 0545   CALCIUM 8.2* 04/15/2014 0504   CALCIUM 9.3 02/16/2014 1443   AST 35 04/01/2014 1400   AST 24 02/16/2014 1443   AST 29 01/04/2014 1107   ALT 21 04/01/2014 1400   ALT 10 02/16/2014 1443   ALT 10 01/04/2014 1107   ALKPHOS 75 04/01/2014 1400   ALKPHOS 96 02/16/2014 1443   ALKPHOS 80 01/04/2014 1107   BILITOT 0.7 04/01/2014 1400   BILITOT 0.42 02/16/2014 1443   BILITOT 0.5 01/04/2014 1107   PROT 7.6 04/01/2014 1400   PROT 7.2 02/16/2014 1443   PROT 6.8 01/04/2014 1107   PROT 6.9 05/12/2013 1453   ALBUMIN 3.9 04/01/2014 1400   ALBUMIN 3.6 02/16/2014 1443   ALBUMIN 3.6 01/04/2014 1107    Studies/Results: Dg Chest 1 View  04/17/2014   CLINICAL DATA:  Tachycardia and dyspnea  EXAM: CHEST  1 VIEW  COMPARISON:  02/09/2014 CT  FINDINGS: There is a pigtail catheter in the left subphrenic region. There is dense consolidation in the left lung base and probable small left  pleural effusion. The right lung is clear. There is moderate cardiomegaly. There is mild vascular prominence, without overt congestive heart failure.  IMPRESSION: Dense consolidation in the left base and small left pleural effusion.   Electronically Signed   By: Andreas Newport M.D.   On: 04/17/2014 05:10   Ct Image Guided Drainage By Percutaneous Catheter  04/16/2014   INDICATION: History of pancreatic cancer, post splenectomy and distal pancreatectomy, now with elevated white blood cell count an fluid collection within the perioperative site worrisome for intra-abdominal abscess. Please perform CT-guided percutaneous drainage catheter placement.  EXAM: CT IMAGE GUIDED DRAINAGE BY PERCUTANEOUS CATHETER  COMPARISON:  CT abdomen pelvis - 04/15/2014  MEDICATIONS: The patient is currently admitted to the hospital and receiving intravenous antibiotics. The antibiotics were  administered within an appropriate time frame prior to the initiation of the procedure.  ANESTHESIA/SEDATION: Versed 1 mg IV  Total Moderate Sedation time  20 minutes  CONTRAST:  None  COMPLICATIONS: None immediate  PROCEDURE: Informed written consent was obtained from the patient after a discussion of the risks, benefits and alternatives to treatment. The patient was placed supine on the CT gantry and a pre procedural CT was performed re-demonstrating the known dominant abscess/fluid collection within the left upper abdomen, anterior to the left kidney with dominant component measuring approximately 4.3 x 2.6 cm (image 13, series 2). The procedure was planned. A timeout was performed prior to the initiation of the procedure.  The skin overlying the left upper lateral abdomen was prepped and draped in the usual sterile fashion. The overlying soft tissues were anesthetized with 1% lidocaine with epinephrine. Appropriate trajectory was planned with the use of a 22 gauge spinal needle. An 18 gauge trocar needle was advanced into the abscess/fluid collection and a short Amplatz super stiff wire was coiled within the collection. Appropriate positioning was confirmed with a limited CT scan. The tract was serially dilated allowing placement of a 10 [place] all-purpose drainage catheter. Appropriate positioning was confirmed with a limited postprocedural CT scan.  Following percutaneous drainage catheter placement, approximately 20 cc of purulent fluid was aspirated. The tube was connected to a JP bulb and sutured in place. A dressing was placed. The patient tolerated the procedure well without immediate post procedural complication.  IMPRESSION: Successful CT guided placement of a 10 French all purpose drain catheter into the dominant abscess/fluid collection within the left upper abdominal quadrant, anterior to the left kidney with aspiration of 25 mL of purulent fluid. Samples were sent to the laboratory as requested by  the ordering clinical team.   Electronically Signed   By: Sandi Mariscal M.D.   On: 04/16/2014 16:11    Anti-infectives: Anti-infectives    Start     Dose/Rate Route Frequency Ordered Stop   04/15/14 1800  vancomycin (VANCOCIN) 1,250 mg in sodium chloride 0.9 % 250 mL IVPB  Status:  Discontinued     1,250 mg 166.7 mL/hr over 90 Minutes Intravenous Every 12 hours 04/15/14 0801 04/17/14 0643   04/15/14 1500  piperacillin-tazobactam (ZOSYN) IVPB 3.375 g     3.375 g 12.5 mL/hr over 240 Minutes Intravenous 3 times per day 04/15/14 0801     04/15/14 0900  vancomycin (VANCOCIN) 1,500 mg in sodium chloride 0.9 % 500 mL IVPB     1,500 mg 250 mL/hr over 120 Minutes Intravenous  Once 04/15/14 0801 04/15/14 1231   04/15/14 0830  piperacillin-tazobactam (ZOSYN) IVPB 3.375 g     3.375 g 100 mL/hr over 30 Minutes  Intravenous  Once 04/15/14 0801 04/15/14 0928   04/08/14 1600  cefoTEtan (CEFOTAN) 2 g in dextrose 5 % 50 mL IVPB     2 g 100 mL/hr over 30 Minutes Intravenous Every 12 hours 04/08/14 1522 04/09/14 0530   04/08/14 0537  ceFAZolin (ANCEF) IVPB 2 g/50 mL premix     2 g 100 mL/hr over 30 Minutes Intravenous On call to O.R. 04/08/14 0537 04/08/14 0759      Assessment & Plans: Status post robotic / lap resection Left subphrenic abscess LLL consolidation / pneumonia Acute renal insufficiency Leukocytosis (?splenectomy)  Remain on liquid diet today  OOB, ambulate, IS use, pulm toilet  Will give NS bolus  Repeat labs in AM - monitor creatinine  PICC line requested due to difficult IV access  IV Zosyn for abscess, pneumonia  Earnstine Regal, MD, [facility] Office: [place] 04/18/2014

## 2014-04-19 LAB — BASIC METABOLIC PANEL
Anion gap: 10 (ref 5–15)
BUN: 17 mg/dL (ref 6–23)
CHLORIDE: 98 mmol/L (ref 96–112)
CO2: 23 mmol/L (ref 19–32)
Calcium: 8 mg/dL — ABNORMAL LOW (ref 8.4–10.5)
Creatinine, Ser: 3.33 mg/dL — ABNORMAL HIGH (ref 0.50–1.10)
GFR calc Af Amer: 15 mL/min — ABNORMAL LOW (ref >90)
GFR, EST NON AFRICAN AMERICAN: 13 mL/min — AB (ref >90)
GLUCOSE: 124 mg/dL — AB (ref 70–99)
POTASSIUM: 3.5 mmol/L (ref 3.5–5.1)
SODIUM: 131 mmol/L — AB (ref 135–145)

## 2014-04-19 LAB — PREPARE RBC (CROSSMATCH)

## 2014-04-19 LAB — CBC
HCT: 20 % — ABNORMAL LOW (ref 36.0–46.0)
Hemoglobin: 6.4 g/dL — CL (ref 12.0–15.0)
MCH: 29.9 pg (ref 26.0–34.0)
MCHC: 32 g/dL (ref 30.0–36.0)
MCV: 93.5 fL (ref 78.0–100.0)
Platelets: 475 10*3/uL — ABNORMAL HIGH (ref 150–400)
RBC: 2.14 MIL/uL — AB (ref 3.87–5.11)
RDW: 13.8 % (ref 11.5–15.5)
WBC: 15 10*3/uL — ABNORMAL HIGH (ref 4.0–10.5)

## 2014-04-19 MED ORDER — TAMSULOSIN HCL 0.4 MG PO CAPS
0.4000 mg | ORAL_CAPSULE | Freq: Every day | ORAL | Status: DC
  Administered 2014-04-19 – 2014-05-04 (×15): 0.4 mg via ORAL
  Filled 2014-04-19 (×16): qty 1

## 2014-04-19 MED ORDER — SODIUM CHLORIDE 0.9 % IV BOLUS (SEPSIS)
1000.0000 mL | Freq: Once | INTRAVENOUS | Status: AC
  Administered 2014-04-19: 1000 mL via INTRAVENOUS

## 2014-04-19 MED ORDER — SODIUM CHLORIDE 0.9 % IV SOLN
Freq: Once | INTRAVENOUS | Status: AC
  Administered 2014-04-19: 08:00:00 via INTRAVENOUS

## 2014-04-19 NOTE — Progress Notes (Signed)
Referring Physician(s): [facility]  Subjective:  Panc cancer Post op abscess LUQ drain placed in IR 3/18 Feeling some better   Allergies: Sulfa antibiotics  Medications: Prior to Admission medications   Medication Sig Start Date End Date Taking? Authorizing Provider  alum & mag hydroxide-simeth (MAALOX PLUS) 400-400-40 MG/5ML suspension Take 30 mLs by mouth every 6 (six) hours as needed for indigestion.   Yes Historical Provider, MD  atorvastatin (LIPITOR) 40 MG tablet TAKE 1 TABLET BY MOUTH DAILY 03/28/14  Yes Mancel Bale, PA-C  brimonidine-timolol (COMBIGAN) 0.2-0.5 % ophthalmic solution Place 1 drop into the left eye every 12 (twelve) hours.   Yes Historical Provider, MD  CARTIA XT 120 MG 24 hr capsule TAKE ONE CAPSULE BY MOUTH DAILY Patient taking differently: TAKE ONE CAPSULE BY MOUTH DAILY IN THE EVENING 03/28/14  Yes Mancel Bale, PA-C  enoxaparin (LOVENOX) 100 MG/ML injection Inject 1 mL (100 mg total) into the skin every 12 (twelve) hours. 02/16/14  Yes Truitt Merle, MD  gabapentin (NEURONTIN) 300 MG capsule TAKE ONE CAPSULE BY MOUTH AT BEDTIME; AFTER 7 DAYS, MAY INCREASE TO 2 CAPSULES AT BEDTIME 03/28/14  Yes Mancel Bale, PA-C  HYDROcodone-acetaminophen (NORCO/VICODIN) 5-325 MG per tablet Take 1 tablet by mouth every 6 (six) hours as needed for moderate pain. Patient taking differently: Take 2 tablets by mouth every 4 (four) hours as needed for moderate pain.  12/31/13  Yes Wardell Honour, MD  latanoprost (XALATAN) 0.005 % ophthalmic solution Place 1 drop into both eyes at bedtime.   Yes Historical Provider, MD  pantoprazole (PROTONIX) 40 MG tablet TAKE 1 TABLET BY MOUTH DAILY 03/28/14  Yes Leandrew Koyanagi, MD  primidone (MYSOLINE) 50 MG tablet Take 50 mg by mouth daily. Patient takes at nite   Yes Historical Provider, MD  propranolol ER (INDERAL LA) 120 MG 24 hr capsule TAKE ONE CAPSULE BY MOUTH DAILY Patient taking differently: TAKE ONE CAPSULE BY MOUTH DAILY AT BEDTIME 03/28/14   Yes Mancel Bale, PA-C  acetaminophen (TYLENOL) 500 MG tablet Take 500 mg by mouth every 6 (six) hours as needed (Pain).    Historical Provider, MD  doxycycline (VIBRA-TABS) 100 MG tablet Take 1 tablet (100 mg total) by mouth 2 (two) times daily. 03/19/14   Golden Circle, FNP  traMADol (ULTRAM) 50 MG tablet Take 1 tablet (50 mg total) by mouth every 8 (eight) hours as needed. Patient not taking: Reported on 03/29/2014 03/19/14   Golden Circle, FNP     Vital Signs: BP 120/75 mmHg  Pulse 82  Temp(Src) 98.2 F (36.8 C) (Oral)  Resp 12  Ht 5' 9.5" (1.765 m)  Wt 97.3 kg (214 lb 8.1 oz)  BMI 31.23 kg/m2  SpO2 100%  Physical Exam  Abdominal: Soft. Bowel sounds are normal. There is tenderness.  Site of LUQ drain clean and dry Sl tender Output cloudy yellow 80 cc from JP now 170 cc from JP yesterday Wbc 15 (20) Cx: pseudomonas aeruginosa     Imaging: Dg Chest 1 View  04/17/2014   CLINICAL DATA:  Tachycardia and dyspnea  EXAM: CHEST  1 VIEW  COMPARISON:  02/09/2014 CT  FINDINGS: There is a pigtail catheter in the left subphrenic region. There is dense consolidation in the left lung base and probable small left pleural effusion. The right lung is clear. There is moderate cardiomegaly. There is mild vascular prominence, without overt congestive heart failure.  IMPRESSION: Dense consolidation in the left base and small  left pleural effusion.   Electronically Signed   By: Andreas Newport M.D.   On: 04/17/2014 05:10   Ct Abdomen Pelvis W Contrast  04/15/2014   CLINICAL DATA:  Abdominal cavity abscess.  EXAM: CT ABDOMEN AND PELVIS WITH CONTRAST  TECHNIQUE: Multidetector CT imaging of the abdomen and pelvis was performed using the standard protocol following bolus administration of intravenous contrast.  CONTRAST:  153mL OMNIPAQUE IOHEXOL 300 MG/ML  SOLN  COMPARISON:  CT scan of December 22, 2013. MRI scan of January 05, 2014.  FINDINGS: Mild degenerative disc disease is noted at L3-4 and  L4-5. Moderate left pleural effusion is noted with adjacent atelectasis of the left lower lobe.  Status post cholecystectomy. Stable lobulated cyst seen in inferior tip of right hepatic lobe. Status post splenectomy and distal pancreatectomy. Fluid collection measuring 5.9 x 3.1 cm is noted superior and medial to to upper pole of left kidney. 6.5 x 5.1 cm subdiaphragmatic fluid collection is noted as well with surgical drain within it. No hydronephrosis or renal obstruction is noted. Adrenal glands appear normal. 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 No evidence of abdominal aortic aneurysm is noted. There is no evidence of bowel obstruction. Urinary bladder is decompressed secondary to Foley catheter. Small amount of free fluid is noted in the dependent portion of the pelvis. No significant adenopathy is noted. New multiple rounded densities are noted in the subcutaneous tissues of the lower abdomen bilaterally most consistent with injection sites.  IMPRESSION: Status post splenectomy and distal pancreatectomy. 5.9 x 3.1 cm fluid collection is seen superior and medial to upper pole of left kidney as well as 6.5 x 5.1 cm left sub diaphoretic fluid collection with surgical drain present within it. These are concerning for probable abscesses.  Moderate left pleural effusion is noted with adjacent atelectasis of the left lower lobe.   Electronically Signed   By: Marijo Conception, M.D.   On: 04/15/2014 17:16   Ct Image Guided Drainage By Percutaneous Catheter  04/16/2014   INDICATION: History of pancreatic cancer, post splenectomy and distal pancreatectomy, now with elevated white blood cell count an fluid collection within the perioperative site worrisome for intra-abdominal abscess. Please perform CT-guided percutaneous drainage catheter placement.  EXAM: CT IMAGE GUIDED DRAINAGE BY PERCUTANEOUS CATHETER  COMPARISON:   CT abdomen pelvis - 04/15/2014  MEDICATIONS: The patient is currently admitted to the hospital and receiving intravenous antibiotics. The antibiotics were administered within an appropriate time frame prior to the initiation of the procedure.  ANESTHESIA/SEDATION: Versed 1 mg IV  Total Moderate Sedation time  20 minutes  CONTRAST:  None  COMPLICATIONS: None immediate  PROCEDURE: Informed written consent was obtained from the patient after a discussion of the risks, benefits and alternatives to treatment. The patient was placed supine on the CT gantry and a pre procedural CT was performed re-demonstrating the known dominant abscess/fluid collection within the left upper abdomen, anterior to the left kidney with dominant component measuring approximately 4.3 x 2.6 cm (image 13, series 2). The procedure was planned. A timeout was performed prior to the initiation of the procedure.  The skin overlying the left upper lateral abdomen was prepped and draped in the usual sterile fashion. The overlying soft tissues were anesthetized with 1% lidocaine with epinephrine. Appropriate trajectory was planned with the use of a 22 gauge spinal needle. An 18 gauge trocar needle was advanced into  the abscess/fluid collection and a short Amplatz super stiff wire was coiled within the collection. Appropriate positioning was confirmed with a limited CT scan. The tract was serially dilated allowing placement of a 10 [place] all-purpose drainage catheter. Appropriate positioning was confirmed with a limited postprocedural CT scan.  Following percutaneous drainage catheter placement, approximately 20 cc of purulent fluid was aspirated. The tube was connected to a JP bulb and sutured in place. A dressing was placed. The patient tolerated the procedure well without immediate post procedural complication.  IMPRESSION: Successful CT guided placement of a 10 French all purpose drain catheter into the dominant abscess/fluid collection within the  left upper abdominal quadrant, anterior to the left kidney with aspiration of 25 mL of purulent fluid. Samples were sent to the laboratory as requested by the ordering clinical team.   Electronically Signed   By: Sandi Mariscal M.D.   On: 04/16/2014 16:11    Labs:  CBC:  Recent Labs  04/14/14 0505 04/15/14 0504 04/18/14 0545 04/19/14 0630  WBC 15.8* 19.1* 20.0* 15.0*  HGB 7.6* 7.3* 7.4* 6.4*  HCT 22.9* 22.0* 22.7* 20.0*  PLT 312 320 501* 475*    COAGS:  Recent Labs  04/01/14 1400 04/08/14 0630 04/09/14 0404  INR 1.11 1.06 1.12  APTT  --  40* 33    BMP:  Recent Labs  04/14/14 0505 04/15/14 0504 04/17/14 0445 04/18/14 0545 04/19/14 0630  NA 136 135  --  135 131*  K 3.5 3.7  --  3.8 3.5  CL 97 98  --  98 98  CO2 27 27  --  23 23  GLUCOSE 144* 127*  --  122* 124*  BUN <5* 5*  --  15 17  CALCIUM 8.4 8.2*  --  8.3* 8.0*  CREATININE 0.55 0.55 1.84* 2.59* 3.33*  GFRNONAA >90 >90 27* 18* 13*  GFRAA >90 >90 32* 21* 15*    LIVER FUNCTION TESTS:  Recent Labs  05/03/13 1306 05/12/13 1453 01/04/14 1107 02/16/14 1443 04/01/14 1400  BILITOT 0.6  --  0.5 0.42 0.7  AST 26  --  29 24 35  ALT 8  --  10 10 21   ALKPHOS 109  --  80 96 75  PROT 7.2 6.9 6.8 7.2 7.6  ALBUMIN 3.8  --  3.6 3.6 3.9    Assessment and Plan:  LUQ drain intact Will [id number] Plan per Dr Barry Dienes  Signed: Monia Sabal A 04/19/2014, 10:25 AM   I spent a total of 15 Minutes in face to face in clinical consultation/evaluation, greater than 50% of which was counseling/coordinating care for LUQ drain placement

## 2014-04-19 NOTE — Progress Notes (Addendum)
Patient ID: [name], female   DOB: [birth year], 69 y.o.   MRN: [id number]  [facility]  POD#: 11  Subjective: Patient got perc drain over weekend.    Objective: Vital signs in last 24 hours: Temp:  [97.7 F (36.5 C)-98.2 F (36.8 C)] 98.2 F (36.8 C) (03/21 0636) Pulse Rate:  [82-92] 82 (03/21 0636) Resp:  [10-16] 16 (03/21 0636) BP: (98-120)/(53-75) 120/75 mmHg (03/21 0636) SpO2:  [96 %-100 %] 100 % (03/21 0636) Last BM Date: 04/15/14  Intake/Output from previous day: 03/20 0701 - 03/21 0700 In: 1180 [I.V.:1000; IV Piggyback:150] Out: 1430 [Urine:1225; Drains:205] Intake/Output this shift:    Physical Exam: HEENT - sclerae clear, mucous membranes moist Chest - breathing comfortably Abdomen - soft, obese; drains LUQ with thin yellow to greenish output; wound dry and intact  Lab Results:   Recent Labs  04/18/14 0545 04/19/14 0630  WBC 20.0* 15.0*  HGB 7.4* 6.4*  HCT 22.7* 20.0*  PLT 501* 475*   BMET  Recent Labs  04/18/14 0545 04/19/14 0630  NA 135 131*  K 3.8 3.5  CL 98 98  CO2 23 23  GLUCOSE 122* 124*  BUN 15 17  CREATININE 2.59* 3.33*  CALCIUM 8.3* 8.0*   PT/INR No results for input(s): LABPROT, INR in the last 72 hours. Comprehensive Metabolic Panel:    Component Value Date/Time   NA 131* 04/19/2014 0630   NA 135 04/18/2014 0545   NA 140 02/16/2014 1443   K 3.5 04/19/2014 0630   K 3.8 04/18/2014 0545   K 3.8 02/16/2014 1443   CL 98 04/19/2014 0630   CL 98 04/18/2014 0545   CO2 23 04/19/2014 0630   CO2 23 04/18/2014 0545   CO2 27 02/16/2014 1443   BUN 17 04/19/2014 0630   BUN 15 04/18/2014 0545   BUN 7.7 02/16/2014 1443   CREATININE 3.33* 04/19/2014 0630   CREATININE 2.59* 04/18/2014 0545   CREATININE 0.7 02/16/2014 1443   CREATININE 0.99 01/04/2014 1107   CREATININE 0.66 05/03/2013 1306   GLUCOSE 124* 04/19/2014 0630   GLUCOSE 122* 04/18/2014 0545   GLUCOSE 97 02/16/2014 1443   CALCIUM  8.0* 04/19/2014 0630   CALCIUM 8.3* 04/18/2014 0545   CALCIUM 9.3 02/16/2014 1443   AST 35 04/01/2014 1400   AST 24 02/16/2014 1443   AST 29 01/04/2014 1107   ALT 21 04/01/2014 1400   ALT 10 02/16/2014 1443   ALT 10 01/04/2014 1107   ALKPHOS 75 04/01/2014 1400   ALKPHOS 96 02/16/2014 1443   ALKPHOS 80 01/04/2014 1107   BILITOT 0.7 04/01/2014 1400   BILITOT 0.42 02/16/2014 1443   BILITOT 0.5 01/04/2014 1107   PROT 7.6 04/01/2014 1400   PROT 7.2 02/16/2014 1443   PROT 6.8 01/04/2014 1107   PROT 6.9 05/12/2013 1453   ALBUMIN 3.9 04/01/2014 1400   ALBUMIN 3.6 02/16/2014 1443   ALBUMIN 3.6 01/04/2014 1107    Studies/Results: No results found.  Anti-infectives: Anti-infectives    Start     Dose/Rate Route Frequency Ordered Stop   04/15/14 1800  vancomycin (VANCOCIN) 1,250 mg in sodium chloride 0.9 % 250 mL IVPB  Status:  Discontinued     1,250 mg 166.7 mL/hr over 90 Minutes Intravenous Every 12 hours 04/15/14 0801 04/17/14 0643   04/15/14 1500  piperacillin-tazobactam (ZOSYN) IVPB 3.375 g     3.375 g 12.5 mL/hr over 240 Minutes Intravenous 3 times per day 04/15/14 0801  04/15/14 0900  vancomycin (VANCOCIN) 1,500 mg in sodium chloride 0.9 % 500 mL IVPB     1,500 mg 250 mL/hr over 120 Minutes Intravenous  Once 04/15/14 0801 04/15/14 1231   04/15/14 0830  piperacillin-tazobactam (ZOSYN) IVPB 3.375 g     3.375 g 100 mL/hr over 30 Minutes Intravenous  Once 04/15/14 0801 04/15/14 0928   04/08/14 1600  cefoTEtan (CEFOTAN) 2 g in dextrose 5 % 50 mL IVPB     2 g 100 mL/hr over 30 Minutes Intravenous Every 12 hours 04/08/14 1522 04/09/14 0530   04/08/14 0537  ceFAZolin (ANCEF) IVPB 2 g/50 mL premix     2 g 100 mL/hr over 30 Minutes Intravenous On call to O.R. 04/08/14 0537 04/08/14 0759      Assessment & Plans: Status post robotic / lap resection Left subphrenic abscess LLL consolidation / pneumonia Acute renal insufficiency Leukocytosis (?splenectomy)    Advance to soft  diet  OOB, ambulate, IS use, pulm toilet  Will give NS bolus again  Repeat labs in AM - creatinine higher  PICC line in place  IV Zosyn/vanc for abscess, pneumonia. Holding for elevated vanc level.    May need nephrology consult.     [facility] Office: [place] 04/19/2014

## 2014-04-19 NOTE — Progress Notes (Signed)
PT Cancellation Note  Patient Details Name: [name] MRN: [id number] DOB: [birth year]   Cancelled Treatment:    Reason Eval/Treat Not Completed: Medical issues which prohibited therapy   [facility] 04/19/2014, 2:25 PM

## 2014-04-19 NOTE — Progress Notes (Signed)
Lab called with a critical lab value on pt. Her Hgb is 6.4.  Paged on call MD for [facility] in Amnion. Did not receive a call back. Will notify MD when office opens.

## 2014-04-19 NOTE — Progress Notes (Signed)
ANTIBIOTIC CONSULT NOTE - FOLLOW UP  Pharmacy Consult for Zosyn, vancomycin Indication: intra-abdominal abscess; pneumonia  Allergies  Allergen Reactions  . Sulfa Antibiotics Swelling    Patient Measurements: Height: 5' 9.5" (176.5 cm) Weight: 214 lb 8.1 oz (97.3 kg) IBW/kg (Calculated) : 67.35   Vital Signs: Temp: 98.2 F (36.8 C) (03/21 0636) Temp Source: Oral (03/21 0636) BP: 120/75 mmHg (03/21 0636) Pulse Rate: 82 (03/21 0636) Intake/Output from previous day: 03/20 0701 - 03/21 0700 In: 1180 [I.V.:1000; IV Piggyback:150] Out: 1430 [Urine:1225; Drains:205] Intake/Output from this shift:    Labs:  Recent Labs  04/17/14 0445 04/18/14 0545 04/19/14 0630  WBC  --  20.0* 15.0*  HGB  --  7.4* 6.4*  PLT  --  501* 475*  CREATININE 1.84* 2.59* 3.33*   Estimated Creatinine Clearance: 20.5 mL/min (by C-G formula based on Cr of 3.33).  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  Orders were received to begin empiric vancomycin and Zosyn with pharmacy dosing assistance requested.  Significant events: 3/18 percutaneous drain placed in IR for intra-abdominal abscess 3/19 CXR showed dense consolidation in LLL c/w PNA 3/19 SCr rising, vancomycin trough supratherapeutic, vancomycin held for now (see below)  Antibiotic levels and dosage changes: 3/19: vancomycin trough 38.7 on 1250mg  q12h after 1500mg  loading dose - hold vanco 3/19: vancomycin random 33.3 - continue  holding vanco 3/20: vancomycin random 28.4 - continue holding vanco  Today, 04/19/2014: D#5 Zosyn 3.375 grams IV q8h, each dose over 4 hours D#5 Vancomycin (but currently on hold due to elevated levels and SCr - last given on 3/18) Afebrile since 3/16 WBC elevated but improving SCr continues to rise; UOP 0.5 mL/kg/hr. Estiamted CrCl just above the lower acceptable limit for continued use of Zosyn extended-infusion regimen without renal-dose adjustments Result still pending on culture from intra-abdominal abscess   Goal of Therapy:  Appropriate antibiotic dosing for indication and renal function; eradication of infection. Vancomycin trough 15-20  Plan:  1. Continue holding vancomycin -vancomycin random level and SCr tomorrow AM -regardless of results, would not re-dose with vancomycin without confirming MD intent to continue this therapy 2. Continue Zosyn 3.375 grams IV q8h (by extended-infusion)  -recheck SCr tomorrow AM; if estimated CrCl < 20 mL/min will need dosage reduction 3. Await results from abscess culture 4. Follow clinical course.  Clayburn Pert, PharmD, BCPS Pager: [phone] 04/19/2014  8:40 AM

## 2014-04-20 ENCOUNTER — Inpatient Hospital Stay (HOSPITAL_COMMUNITY): Payer: Medicare Other

## 2014-04-20 LAB — URINE MICROSCOPIC-ADD ON

## 2014-04-20 LAB — SODIUM, URINE, RANDOM: Sodium, Ur: 31 mEq/L

## 2014-04-20 LAB — BASIC METABOLIC PANEL
Anion gap: 12 (ref 5–15)
BUN: 18 mg/dL (ref 6–23)
CALCIUM: 8.1 mg/dL — AB (ref 8.4–10.5)
CO2: 21 mmol/L (ref 19–32)
Chloride: 100 mmol/L (ref 96–112)
Creatinine, Ser: 3.7 mg/dL — ABNORMAL HIGH (ref 0.50–1.10)
GFR calc Af Amer: 14 mL/min — ABNORMAL LOW (ref >90)
GFR, EST NON AFRICAN AMERICAN: 12 mL/min — AB (ref >90)
GLUCOSE: 101 mg/dL — AB (ref 70–99)
Potassium: 3.8 mmol/L (ref 3.5–5.1)
Sodium: 133 mmol/L — ABNORMAL LOW (ref 135–145)

## 2014-04-20 LAB — TYPE AND SCREEN
ABO/RH(D): AB NEG
ANTIBODY SCREEN: NEGATIVE
Unit division: 0
Unit division: 0

## 2014-04-20 LAB — CULTURE, ROUTINE-ABSCESS
Gram Stain: NONE SEEN
SPECIAL REQUESTS: NORMAL

## 2014-04-20 LAB — URINALYSIS, ROUTINE W REFLEX MICROSCOPIC
BILIRUBIN URINE: NEGATIVE
Glucose, UA: NEGATIVE mg/dL
Ketones, ur: NEGATIVE mg/dL
Leukocytes, UA: NEGATIVE
NITRITE: NEGATIVE
Protein, ur: 30 mg/dL — AB
SPECIFIC GRAVITY, URINE: 1.009 (ref 1.005–1.030)
Urobilinogen, UA: 0.2 mg/dL (ref 0.0–1.0)
pH: 6 (ref 5.0–8.0)

## 2014-04-20 LAB — CREATININE, URINE, RANDOM: Creatinine, Urine: 59.7 mg/dL

## 2014-04-20 LAB — VANCOMYCIN, RANDOM: Vancomycin Rm: 23.9 ug/mL

## 2014-04-20 MED ORDER — PIPERACILLIN-TAZOBACTAM IN DEX 2-0.25 GM/50ML IV SOLN
2.2500 g | Freq: Four times a day (QID) | INTRAVENOUS | Status: DC
  Administered 2014-04-20 – 2014-04-21 (×4): 2.25 g via INTRAVENOUS
  Filled 2014-04-20 (×4): qty 50

## 2014-04-20 MED ORDER — BISACODYL 10 MG RE SUPP
10.0000 mg | Freq: Every day | RECTAL | Status: DC
  Administered 2014-04-20: 10 mg via RECTAL
  Filled 2014-04-20: qty 1

## 2014-04-20 NOTE — Progress Notes (Signed)
ANTIBIOTIC CONSULT NOTE - FOLLOW UP  Pharmacy Consult for Zosyn, vancomycin Indication: intra-abdominal abscess; pneumonia  Allergies  Allergen Reactions  . Sulfa Antibiotics Swelling    Patient Measurements: Height: 5' 9.5" (176.5 cm) Weight: 214 lb 8.1 oz (97.3 kg) IBW/kg (Calculated) : 67.35   Vital Signs: Temp: 98.5 F (36.9 C) (03/22 0900) Temp Source: Oral (03/22 0900) BP: 123/60 mmHg (03/22 0900) Pulse Rate: 79 (03/22 0900) Intake/Output from previous day: 03/21 0701 - 03/22 0700 In: 3365 [P.O.:840; I.V.:390; Blood:800; IV Piggyback:1325] Out: 950 [Urine:600; Drains:350] Intake/Output from this shift: Total I/O In: 560 [P.O.:360; I.V.:200] Out: 310 [Urine:250; Drains:60]  Labs:  Recent Labs  04/18/14 0545 04/19/14 0630 04/20/14 0535  WBC 20.0* 15.0*  --   HGB 7.4* 6.4*  --   PLT 501* 475*  --   CREATININE 2.59* 3.33* 3.70*   Estimated Creatinine Clearance: 18.5 mL/min (by C-G formula based on Cr of 3.7).  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  Orders were received to begin empiric vancomycin and Zosyn with pharmacy dosing assistance requested for multiple abd abscesses s/p IR perc drainage 3/18, and for PNA.  3/17 >> vancomycin >> 3/17 >> Zosyn >>   Tmax: afeb since 3/16 WBCs: elevated but improving Renal: AKI - SCr still rising, 0.55-->1.84-->2.59-->3.33 --> 3.70, CrCl 25 ml/min (CG, TBW), 15 ml/min (normalized),UOP only 0.3 ml/kg/hr yesterday  Micro: 3/18 abscess cx: few P.aeruginosa (pan-sensitive)  Significant events: 3/18 percutaneous drain placed in IR for intra-abdominal abscess 3/19 CXR showed dense consolidation in LLL c/w PNA 3/19 SCr rising, vancomycin trough  supratherapeutic, vancomycin held for now (see below)  Antibiotic levels and dosage changes: 3/19: VT @ 0500 =_38.7__ on 1250 q12h - HOLD vanc 3/19: VR @ 1800 = 33.3 -continue to HOLD vanc 3/20: VR @ 1800 = 28.4 - cont to hold vanc 3/22: VR @ 0530 = 23.9 - cont to hold vanc  Today, 04/20/2014: D#6 Zosyn 3.375 grams IV q8h, each dose over 4 hours D#6 Vancomycin (but currently on hold due to elevated levels and SCr - last given on 3/18) Afebrile since 3/16 WBC elevated but improving SCr continues to rise; nephrology note attributing to contrast (CT 3/17)  Goal of Therapy:  Appropriate antibiotic dosing for indication and renal function; eradication of infection. Vancomycin trough 15-20  Plan:  1. Continue holding vancomycin -vancomycin random level and SCr tomorrow AM -regardless of results, would not re-dose with vancomycin without confirming MD intent to continue this therapy 2. Adjust Zosyn to 2.25g IV q6h.  SCr continues to increase and estimated CrCl<20 ml/min (CG, using adjusted body weight).  Hershal Coria, PharmD, BCPS Pager: [phone] 04/20/2014 1:12 PM

## 2014-04-20 NOTE — Progress Notes (Signed)
PT Cancellation Note  Patient Details Name: [name] MRN: [id number] DOB: 04-19-1945   Cancelled Treatment:    Reason Eval/Treat Not Completed: Fatigue/lethargy limiting ability to participate (pt stated she just transferred from recliner to bed and is too tired to do PT right now. Will follow. )   [name] 04/20/2014, 11:24 AM [phone]

## 2014-04-20 NOTE — Progress Notes (Signed)
Patient ID: [name], female   DOB: 08/07/1945, 69 y.o.   MRN: [id number]  [facility]  POD#: 12  Subjective: Received blood yesterday.  Tolerated soft diet.  No BM but pt has had flatus.     Objective: Vital signs in last 24 hours: Temp:  [97.3 F (36.3 C)-98.5 F (36.9 C)] 97.3 F (36.3 C) (03/22 1400) Pulse Rate:  [72-82] 72 (03/22 1400) Resp:  [9-20] 9 (03/22 2014) BP: (94-127)/(50-69) 94/50 mmHg (03/22 1400) SpO2:  [99 %-100 %] 100 % (03/22 2014) Last BM Date: 04/15/14  Intake/Output from previous day: 03/21 0701 - 03/22 0700 In: 3365 [P.O.:840; I.V.:390; Blood:800; IV Piggyback:1325] Out: 950 [Urine:600; Drains:350] Intake/Output this shift:    Physical Exam: HEENT - sclerae clear, mucous membranes moist Chest - breathing comfortably Abdomen - soft, obese; drains LUQ with purulent drainage; wound dry and intact  Lab Results:   Recent Labs  04/18/14 0545 04/19/14 0630  WBC 20.0* 15.0*  HGB 7.4* 6.4*  HCT 22.7* 20.0*  PLT 501* 475*   BMET  Recent Labs  04/19/14 0630 04/20/14 0535  NA 131* 133*  K 3.5 3.8  CL 98 100  CO2 23 21  GLUCOSE 124* 101*  BUN 17 18  CREATININE 3.33* 3.70*  CALCIUM 8.0* 8.1*   PT/INR No results for input(s): LABPROT, INR in the last 72 hours. Comprehensive Metabolic Panel:    Component Value Date/Time   NA 133* 04/20/2014 0535   NA 131* 04/19/2014 0630   NA 140 02/16/2014 1443   K 3.8 04/20/2014 0535   K 3.5 04/19/2014 0630   K 3.8 02/16/2014 1443   CL 100 04/20/2014 0535   CL 98 04/19/2014 0630   CO2 21 04/20/2014 0535   CO2 23 04/19/2014 0630   CO2 27 02/16/2014 1443   BUN 18 04/20/2014 0535   BUN 17 04/19/2014 0630   BUN 7.7 02/16/2014 1443   CREATININE 3.70* 04/20/2014 0535   CREATININE 3.33* 04/19/2014 0630   CREATININE 0.7 02/16/2014 1443   CREATININE 0.99 01/04/2014 1107   CREATININE 0.66 05/03/2013 1306   GLUCOSE 101* 04/20/2014 0535   GLUCOSE 124*  04/19/2014 0630   GLUCOSE 97 02/16/2014 1443   CALCIUM 8.1* 04/20/2014 0535   CALCIUM 8.0* 04/19/2014 0630   CALCIUM 9.3 02/16/2014 1443   AST 35 04/01/2014 1400   AST 24 02/16/2014 1443   AST 29 01/04/2014 1107   ALT 21 04/01/2014 1400   ALT 10 02/16/2014 1443   ALT 10 01/04/2014 1107   ALKPHOS 75 04/01/2014 1400   ALKPHOS 96 02/16/2014 1443   ALKPHOS 80 01/04/2014 1107   BILITOT 0.7 04/01/2014 1400   BILITOT 0.42 02/16/2014 1443   BILITOT 0.5 01/04/2014 1107   PROT 7.6 04/01/2014 1400   PROT 7.2 02/16/2014 1443   PROT 6.8 01/04/2014 1107   PROT 6.9 05/12/2013 1453   ALBUMIN 3.9 04/01/2014 1400   ALBUMIN 3.6 02/16/2014 1443   ALBUMIN 3.6 01/04/2014 1107    Studies/Results: US Renal  04/20/2014   CLINICAL DATA:  Acute renal failure  EXAM: RENAL/URINARY TRACT ULTRASOUND COMPLETE  COMPARISON:  04/15/2014  FINDINGS: Right Kidney:  Length: 11.7 cm. Echogenicity within normal limits. No mass or hydronephrosis visualized.  Left Kidney:  Length: 12.0 cm. A 2.4 cm cyst is noted in the midportion of the left kidney posteriorly. This corresponds to that seen on recent CT examination.  Bladder:  Decompressed by Foley catheter.  IMPRESSION: Left renal cyst.  Electronically Signed   By: Inez Catalina M.D.   On: 04/20/2014 11:09    Anti-infectives: Anti-infectives    Start     Dose/Rate Route Frequency Ordered Stop   04/20/14 1400  piperacillin-tazobactam (ZOSYN) IVPB 2.25 g     2.25 g 100 mL/hr over 30 Minutes Intravenous Every 6 hours 04/20/14 0713     04/15/14 1800  vancomycin (VANCOCIN) 1,250 mg in sodium chloride 0.9 % 250 mL IVPB  Status:  Discontinued     1,250 mg 166.7 mL/hr over 90 Minutes Intravenous Every 12 hours 04/15/14 0801 04/17/14 0643   04/15/14 1500  piperacillin-tazobactam (ZOSYN) IVPB 3.375 g  Status:  Discontinued     3.375 g 12.5 mL/hr over 240 Minutes Intravenous 3 times per day 04/15/14 0801 04/20/14 1316   04/15/14 0900  vancomycin (VANCOCIN) 1,500 mg in sodium  chloride 0.9 % 500 mL IVPB     1,500 mg 250 mL/hr over 120 Minutes Intravenous  Once 04/15/14 0801 04/15/14 1231   04/15/14 0830  piperacillin-tazobactam (ZOSYN) IVPB 3.375 g     3.375 g 100 mL/hr over 30 Minutes Intravenous  Once 04/15/14 0801 04/15/14 0928   04/08/14 1600  cefoTEtan (CEFOTAN) 2 g in dextrose 5 % 50 mL IVPB     2 g 100 mL/hr over 30 Minutes Intravenous Every 12 hours 04/08/14 1522 04/09/14 0530   04/08/14 0537  ceFAZolin (ANCEF) IVPB 2 g/50 mL premix     2 g 100 mL/hr over 30 Minutes Intravenous On call to O.R. 04/08/14 0537 04/08/14 0759      Assessment & Plans: Status post robotic / lap resection Left subphrenic abscess LLL consolidation / pneumonia Acute renal insufficiency Leukocytosis (?splenectomy)    Soft diet  OOB, ambulate, IS use, pulm toilet   Repeat labs in AM  PICC line in place  IV Zosyn/vanc for abscess, pneumonia. Holding for elevated vanc level.    Consult nephrology  [facility] Office: [place] 04/20/2014

## 2014-04-20 NOTE — Progress Notes (Signed)
12 Days Post-Op  Subjective: Pt doing fairly well; states abd pain has sl improved  Objective: Vital signs in last 24 hours: Temp:  [97.3 F (36.3 C)-98.8 F (37.1 C)] 97.3 F (36.3 C) (03/22 1400) Pulse Rate:  [72-82] 72 (03/22 1400) Resp:  [12-20] 14 (03/22 1400) BP: (94-127)/(50-69) 94/50 mmHg (03/22 1400) SpO2:  [99 %-100 %] 100 % (03/22 1400) Last BM Date: 04/15/14  Intake/Output from previous day: 03/21 0701 - 03/22 0700 In: 3365 [P.O.:840; I.V.:390; Blood:800; IV Piggyback:1325] Out: 950 [Urine:600; Drains:350] Intake/Output this shift: Total I/O In: 560 [P.O.:360; I.V.:200] Out: 575 [Urine:450; Drains:125]  LUQ drain intact, insertion site ok, NT, output 105 cc's turbid, beige fluid; cx's- pseudomonas   Lab Results:   Recent Labs  04/18/14 0545 04/19/14 0630  WBC 20.0* 15.0*  HGB 7.4* 6.4*  HCT 22.7* 20.0*  PLT 501* 475*   BMET  Recent Labs  04/19/14 0630 04/20/14 0535  NA 131* 133*  K 3.5 3.8  CL 98 100  CO2 23 21  GLUCOSE 124* 101*  BUN 17 18  CREATININE 3.33* 3.70*  CALCIUM 8.0* 8.1*   PT/INR No results for input(s): LABPROT, INR in the last 72 hours. ABG No results for input(s): PHART, HCO3 in the last 72 hours.  Invalid input(s): PCO2, PO2  Studies/Results: US Renal  04/20/2014   CLINICAL DATA:  Acute renal failure  EXAM: RENAL/URINARY TRACT ULTRASOUND COMPLETE  COMPARISON:  04/15/2014  FINDINGS: Right Kidney:  Length: 11.7 cm. Echogenicity within normal limits. No mass or hydronephrosis visualized.  Left Kidney:  Length: 12.0 cm. A 2.4 cm cyst is noted in the midportion of the left kidney posteriorly. This corresponds to that seen on recent CT examination.  Bladder:  Decompressed by Foley catheter.  IMPRESSION: Left renal cyst.   Electronically Signed   By: Inez Catalina M.D.   On: 04/20/2014 11:09    Anti-infectives: Anti-infectives    Start     Dose/Rate Route Frequency Ordered Stop   04/20/14 1400  piperacillin-tazobactam (ZOSYN)  IVPB 2.25 g     2.25 g 100 mL/hr over 30 Minutes Intravenous Every 6 hours 04/20/14 0713     04/15/14 1800  vancomycin (VANCOCIN) 1,250 mg in sodium chloride 0.9 % 250 mL IVPB  Status:  Discontinued     1,250 mg 166.7 mL/hr over 90 Minutes Intravenous Every 12 hours 04/15/14 0801 04/17/14 0643   04/15/14 1500  piperacillin-tazobactam (ZOSYN) IVPB 3.375 g  Status:  Discontinued     3.375 g 12.5 mL/hr over 240 Minutes Intravenous 3 times per day 04/15/14 0801 04/20/14 1316   04/15/14 0900  vancomycin (VANCOCIN) 1,500 mg in sodium chloride 0.9 % 500 mL IVPB     1,500 mg 250 mL/hr over 120 Minutes Intravenous  Once 04/15/14 0801 04/15/14 1231   04/15/14 0830  piperacillin-tazobactam (ZOSYN) IVPB 3.375 g     3.375 g 100 mL/hr over 30 Minutes Intravenous  Once 04/15/14 0801 04/15/14 0928   04/08/14 1600  cefoTEtan (CEFOTAN) 2 g in dextrose 5 % 50 mL IVPB     2 g 100 mL/hr over 30 Minutes Intravenous Every 12 hours 04/08/14 1522 04/09/14 0530   04/08/14 0537  ceFAZolin (ANCEF) IVPB 2 g/50 mL premix     2 g 100 mL/hr over 30 Minutes Intravenous On call to O.R. 04/08/14 0537 04/08/14 0759      Assessment/Plan: s/p LUQ abscess drainage 3/18; CBC pend; creat 3.7 (3.33)- nephrology following; antbx per pharm; rec f/u CT once drain  output <10-15 cc's/24 hr;   LOS: 12 days   15 minutes were spent eval pt post abd  abscess drainage Jader Desai,D [facility] 04/20/2014

## 2014-04-20 NOTE — Consult Note (Signed)
Renal Service Consult Note [facility]  [name] 04/20/2014 [name] D Requesting Physician:  Dr Barry Dienes  Reason for Consult:  Acute renal failure after pancreatic surgery for cancer HPI: The patient is a 69 y.o. year-old with hx of HTN, HL, DVT/PE, back surgery. 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  In Nov 2015 she had her third episode with bilat LE DVT.  CT was ordered to evaluate extent of DVT and the CT showed thrombus in IVC, R common iliac and a pancreatic mass 2-3 cm in the tail of the pancreas.  MRI showed prob encasement of the splenic vessels and confirmed the mass.  No chest mets and no LAN by MRI in abdomen. 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 She was admitted for tumor resection which was done on 3/10. The tumor was adherent to the colon. The tumor was removed leaving oozing from the renal capsule and urology came in to evaluate.  The bleeding was controlled. Ultimately she had distal pancreatectomy, splenectomy, partial colectomy and removal of a portion of the renal capsule with tumor removal on the left.  Postop she developed fevers and an abd fluid collection was noted and perc drain placed 3/18 w cultures growing pseudomonas.  Vanc and zosyn were started.  She rec'd 5-6 gm IV vanc on 3/17-3/18 then it was stopped when creat up on 3/18.  Zosyn continued.  Creat is up to 3.70 today. She did get 100 cc of IV contrast with the CT scan on 3.17. Good UOP.   Patient denies SOB or cough, N/v/d, no CP fevers or confusion  Past Medical History  Past Medical History  Diagnosis Date  . Glaucoma   . Clotting disorder   . DVT (deep venous thrombosis)     LEFT  . GERD (gastroesophageal reflux disease)   . Hypercholesterolemia   . Peripheral neuropathy   . Essential tremor   . Diverticulosis   . Arthritis     knees and back  . Cancer     Pancreatic  . Hypertension   . Colon polyps   . Urine incontinence   . UTI (lower  urinary tract infection)   . Complication of anesthesia     slow to wak up   . Tachycardia   . Shortness of breath dyspnea     with exertion   . Pulmonary embolism    Past Surgical History  Past Surgical History  Procedure Laterality Date  . Cholecystectomy    . Abdominal hysterectomy    . Knee surgery Bilateral   . Greenfield filter    . Back surgery    . Eus N/A 02/26/2014    Procedure: UPPER ENDOSCOPIC ULTRASOUND (EUS) LINEAR;  Surgeon: Beryle Beams, MD;  Location: [facility];  Service: Endoscopy;  Laterality: N/A;  . Fine needle aspiration N/A 02/26/2014    Procedure: FINE NEEDLE ASPIRATION (FNA) LINEAR;  Surgeon: Beryle Beams, MD;  Location: [facility];  Service: Endoscopy;  Laterality: N/A;  . Breast lumpectomy Right   . Spine surgery    . Xi robotic assisted laparoscopic distal pancreatectomy N/A 04/08/2014    Procedure: XI ROBOTIC ASSISTED CONVERTED LAPAROSCOPIC HAND ASSITED DISTAL PANCREATECTOMY/SPLENECTOMY WITH PARTIAL COLECTOMY;  Surgeon: [name], MD;  Location: [facility]S;  Service: General;  Laterality: N/A;   Family History  Family History  Problem Relation Age of Onset  . Diabetes Father   . Cancer Father 37  prostate  . Hyperlipidemia Father   . Cataracts Maternal Grandmother   . Hypertension Mother   . Arthritis Mother   . Heart disease Mother   . Glaucoma Sister   . Cancer Sister 63    Breast  . Cancer Paternal Uncle     throat +cig  . Diabetes Paternal Grandmother    Social History  reports that she has been smoking Cigarettes.  She has a 22.5 pack-year smoking history. She has never used smokeless tobacco. She reports that she drinks alcohol. She reports that she does not use illicit drugs. Allergies  Allergies  Allergen Reactions  . Sulfa Antibiotics Swelling   Home medications Prior to Admission medications   Medication Sig Start Date End Date Taking? Authorizing Provider  alum & mag hydroxide-simeth (MAALOX PLUS) 400-400-40 MG/5ML  suspension Take 30 mLs by mouth every 6 (six) hours as needed for indigestion.   Yes Historical Provider, MD  atorvastatin (LIPITOR) 40 MG tablet TAKE 1 TABLET BY MOUTH DAILY 03/28/14  Yes Mancel Bale, PA-C  brimonidine-timolol (COMBIGAN) 0.2-0.5 % ophthalmic solution Place 1 drop into the left eye every 12 (twelve) hours.   Yes Historical Provider, MD  CARTIA XT 120 MG 24 hr capsule TAKE ONE CAPSULE BY MOUTH DAILY Patient taking differently: TAKE ONE CAPSULE BY MOUTH DAILY IN THE EVENING 03/28/14  Yes Mancel Bale, PA-C  enoxaparin (LOVENOX) 100 MG/ML injection Inject 1 mL (100 mg total) into the skin every 12 (twelve) hours. 02/16/14  Yes Truitt Merle, MD  gabapentin (NEURONTIN) 300 MG capsule TAKE ONE CAPSULE BY MOUTH AT BEDTIME; AFTER 7 DAYS, MAY INCREASE TO 2 CAPSULES AT BEDTIME 03/28/14  Yes Mancel Bale, PA-C  HYDROcodone-acetaminophen (NORCO/VICODIN) 5-325 MG per tablet Take 1 tablet by mouth every 6 (six) hours as needed for moderate pain. Patient taking differently: Take 2 tablets by mouth every 4 (four) hours as needed for moderate pain.  12/31/13  Yes Wardell Honour, MD  latanoprost (XALATAN) 0.005 % ophthalmic solution Place 1 drop into both eyes at bedtime.   Yes Historical Provider, MD  pantoprazole (PROTONIX) 40 MG tablet TAKE 1 TABLET BY MOUTH DAILY 03/28/14  Yes Leandrew Koyanagi, MD  primidone (MYSOLINE) 50 MG tablet Take 50 mg by mouth daily. Patient takes at nite   Yes Historical Provider, MD  propranolol ER (INDERAL LA) 120 MG 24 hr capsule TAKE ONE CAPSULE BY MOUTH DAILY Patient taking differently: TAKE ONE CAPSULE BY MOUTH DAILY AT BEDTIME 03/28/14  Yes Mancel Bale, PA-C  acetaminophen (TYLENOL) 500 MG tablet Take 500 mg by mouth every 6 (six) hours as needed (Pain).    Historical Provider, MD  doxycycline (VIBRA-TABS) 100 MG tablet Take 1 tablet (100 mg total) by mouth 2 (two) times daily. 03/19/14   Golden Circle, FNP  traMADol (ULTRAM) 50 MG tablet Take 1 tablet (50 mg total)  by mouth every 8 (eight) hours as needed. Patient not taking: Reported on 03/29/2014 03/19/14   Golden Circle, FNP   Liver Function Tests No results for input(s): AST, ALT, ALKPHOS, BILITOT, PROT, ALBUMIN in the last 168 hours. No results for input(s): LIPASE, AMYLASE in the last 168 hours. CBC  Recent Labs Lab 04/15/14 0504 04/18/14 0545 04/19/14 0630  WBC 19.1* 20.0* 15.0*  NEUTROABS 15.1*  --   --   HGB 7.3* 7.4* 6.4*  HCT 22.0* 22.7* 20.0*  MCV 91.3 92.3 93.5  PLT 320 501* 161*   Basic Metabolic Panel  Recent Labs  Lab 04/14/14 0505 04/15/14 0504 04/17/14 0445 04/18/14 0545 04/19/14 0630 04/20/14 0535  NA 136 135  --  135 131* 133*  K 3.5 3.7  --  3.8 3.5 3.8  CL 97 98  --  98 98 100  CO2 27 27  --  23 23 21   GLUCOSE 144* 127*  --  122* 124* 101*  BUN <5* 5*  --  15 17 18   CREATININE 0.55 0.55 1.84* 2.59* 3.33* 3.70*  CALCIUM 8.4 8.2*  --  8.3* 8.0* 8.1*    Filed Vitals:   04/20/14 0344 04/20/14 0345 04/20/14 0518 04/20/14 0800  BP:  127/69 122/59   Pulse:  81 80   Temp:  98.3 F (36.8 C) 98.4 F (36.9 C)   TempSrc:  Oral Oral   Resp: 20 14 13 12   Height:      Weight:      SpO2: 100% 100% 100% 100%   Exam Alert, no distress No rash, cyanosis or gangrene Sclera anicteric, throat clear No jvd Chest clear bilat, no rales or wheezing RRR no MRG Abd obese, drain L side, nontender, diminished BS but present 1-2 + pitting edema bilat LE's, no UE edema Foley draining clear yellow urine in good amts Neuro is alert, Ox 3, no asterixis  UA 3/16 1.009, neg protein, 3-6wbc, 0-2 rbc CXR 3/19 dense consolidation L base Wt's pending today I/O last 7days 11in and 7L out, +4L   Assessment: 1. Acute renal failure from ATN likely due to contrast (3/17 CT), vanc injury possible but usually takes a few days to develop.  Making urine, mild vol excess.  BP's good.  BP's are soft. She is taking her home po diltiazem and propronalol, not sure the indication (SVT/  tremors/ HTN? ).  Will decrease BP meds, let BP come up around 140/90 for now.  Supportive care otherwise, meds reviewed. Will follow.  2. Panc Ca, s/p distal pancreatectomy 3. HTN on dilt/ propranolol at home 4. Hx DVT/PE  Plan - UA, urine lytes, stop Cardia for now. Will follow.   Kelly Splinter MD (pgr) [phone]    ([phone] 04/20/2014, 9:04 AM

## 2014-04-21 LAB — CBC
HCT: 26.8 % — ABNORMAL LOW (ref 36.0–46.0)
Hemoglobin: 8.7 g/dL — ABNORMAL LOW (ref 12.0–15.0)
MCH: 30 pg (ref 26.0–34.0)
MCHC: 32.5 g/dL (ref 30.0–36.0)
MCV: 92.4 fL (ref 78.0–100.0)
Platelets: 498 10*3/uL — ABNORMAL HIGH (ref 150–400)
RBC: 2.9 MIL/uL — ABNORMAL LOW (ref 3.87–5.11)
RDW: 14.4 % (ref 11.5–15.5)
WBC: 12.5 10*3/uL — ABNORMAL HIGH (ref 4.0–10.5)

## 2014-04-21 LAB — BASIC METABOLIC PANEL
Anion gap: 12 (ref 5–15)
BUN: 21 mg/dL (ref 6–23)
CALCIUM: 8.4 mg/dL (ref 8.4–10.5)
CO2: 22 mmol/L (ref 19–32)
Chloride: 102 mmol/L (ref 96–112)
Creatinine, Ser: 3.94 mg/dL — ABNORMAL HIGH (ref 0.50–1.10)
GFR calc Af Amer: 13 mL/min — ABNORMAL LOW (ref >90)
GFR calc non Af Amer: 11 mL/min — ABNORMAL LOW (ref >90)
GLUCOSE: 120 mg/dL — AB (ref 70–99)
Potassium: 3.8 mmol/L (ref 3.5–5.1)
Sodium: 136 mmol/L (ref 135–145)

## 2014-04-21 LAB — VANCOMYCIN, RANDOM: Vancomycin Rm: 21.1 ug/mL

## 2014-04-21 MED ORDER — HYDROCODONE-ACETAMINOPHEN 5-325 MG PO TABS: 1.0000 | ORAL_TABLET | Freq: Four times a day (QID) | ORAL | Status: DC | PRN

## 2014-04-21 MED ORDER — CIPROFLOXACIN HCL 500 MG PO TABS
500.0000 mg | ORAL_TABLET | ORAL | Status: DC
  Administered 2014-04-21 – 2014-04-26 (×6): 500 mg via ORAL
  Filled 2014-04-21 (×6): qty 1

## 2014-04-21 MED ORDER — MORPHINE SULFATE 2 MG/ML IJ SOLN: 1.0000 mg | INTRAMUSCULAR | Status: DC | PRN

## 2014-04-21 MED ORDER — PANTOPRAZOLE SODIUM 40 MG PO TBEC
40.0000 mg | DELAYED_RELEASE_TABLET | Freq: Every day | ORAL | Status: DC
  Administered 2014-04-21 – 2014-04-24 (×4): 40 mg via ORAL
  Filled 2014-04-21 (×5): qty 1

## 2014-04-21 NOTE — Progress Notes (Signed)
Pt has not voided. Bladder scan showed urinary retention. Foley reinserted per Dr Barry Dienes order. Yellow urine draining.  Pt tolerated well. Will continue to monitor.

## 2014-04-21 NOTE — Progress Notes (Signed)
CSW met with pt and daughter, Madilyne, to assist with d/c planning. Pt / daughter feel ST Rehab is needed at this time. SNF bed offers reviewed. CSW has contacted local SNF laisions to visit with pt / daughter at bedside tomorrow. Pt will make her SNF choice following these visits. CSW will continue to follow to assist with d/c planning to SNF.  Werner Lean LCSW [phone]

## 2014-04-21 NOTE — Progress Notes (Signed)
ANTIBIOTIC CONSULT NOTE - FOLLOW UP  Pharmacy Consult for Zosyn, Vancomycin Indication: intra-abdominal abscess; pneumonia  Allergies  Allergen Reactions  . Sulfa Antibiotics Swelling    Patient Measurements: Height: 5' 9.5" (176.5 cm) Weight: 214 lb 8.1 oz (97.3 kg) IBW/kg (Calculated) : 67.35   Vital Signs: Temp: 98.1 F (36.7 C) (03/23 0524) Temp Source: Oral (03/23 0524) BP: 114/57 mmHg (03/23 0524) Pulse Rate: 81 (03/23 0524) Intake/Output from previous day: 03/22 0701 - 03/23 0700 In: 2020 [P.O.:720; I.V.:1200; IV Piggyback:100] Out: 1445 [Urine:1025; Drains:220; EXNTZ:001] Intake/Output from this shift:    Labs:  Recent Labs  04/19/14 0630 04/20/14 0535 04/20/14 0941 04/21/14 0510  WBC 15.0*  --   --  12.5*  HGB 6.4*  --   --  8.7*  PLT 475*  --   --  498*  LABCREA  --   --  59.7  --   CREATININE 3.33* 3.70*  --  3.94*   Estimated Creatinine Clearance: 17.4 mL/min (by C-G formula based on Cr of 3.94).  Recent Labs  04/20/14 0535 04/21/14 0510  VANCORANDOM 23.9 21.1     Assessment: 61 yoF with pancreatic adenoCa, underwent pancreatectomy, splenectomy, and partial colectomy 3/10. Required mech ventilation post-op. Now transferred to medical floor, bowel function slowly returning but with new temps and elevated WBCs, and some purulent drainage at Sgx site. To CT soon to r/o abscess, but surgery would like to start vancomycin/Zosyn empirically until then.   3/17 >> Vancomycin >> 3/17 >> Zosyn >>   Micro: 3/18 abscess cx: few P.aeruginosa (pan-sensitive)  Significant events: 3/18 percutaneous drain placed in IR for intra-abdominal abscess 3/19 CXR showed dense consolidation in LLL c/w PNA 3/19 SCr rising, vancomycin trough supratherapeutic, vancomycin held for now (see below)  Antibiotic levels and dosage changes: 3/19: VT @ 0500 = 38.7 on 1250 q12h - hold vanc 3/19: VR @ 1800 = 33.3 - cont to hold vanc 3/20: VR @ 1800 = 28.4 - cont to hold  vanc 3/22: VR @ 0530 = 23.9 - cont to hold vanc. Zosyn dose reduced. 3/23: VR @ 0510 = 21.1 - cont to hold vanc  Today, 04/21/2014: D#7 Zosyn.  D#7 Vancomycin (currently on hold due to elevated levels and SCr - last given on 3/18) Tmax: afeb since 3/16 WBCs: elevated but improving Renal: AKI - SCr still rising, 0.55-->1.84-->2.59-->3.33 -->3.70 -->3.94, CrCl 17(CG, TBW), 15N UOP only 0.4 ml/kg/hr yesterday  Goal of Therapy:  Appropriate antibiotic dosing for indication and renal function; eradication of infection. Vancomycin trough 15-20  Plan:  1. Continue holding vancomycin -vancomycin random level and SCr tomorrow AM -regardless of results, would not re-dose with vancomycin without confirming MD intent to continue this therapy 2. Continue Zosyn 2.25g IV q6h.  SCr continues to increase and estimated CrCl<20 ml/min (CG, using adjusted body weight).  Romeo Rabon, PharmD, pager [phone]. 04/21/2014,7:23 AM.

## 2014-04-21 NOTE — Progress Notes (Signed)
Patient ID: [name], female   DOB: Mar 13, 1945, 69 y.o.   MRN: [id number]  [facility]  POD#: 13  Subjective: Pt had large BM yesterday.  Grew pseudomonas from abscess.    Objective: Vital signs in last 24 hours: Temp:  [97.3 F (36.3 C)-98.5 F (36.9 C)] 98.1 F (36.7 C) (03/23 0524) Pulse Rate:  [72-89] 81 (03/23 0524) Resp:  [8-15] 14 (03/23 0753) BP: (94-123)/(50-60) 114/57 mmHg (03/23 0524) SpO2:  [90 %-100 %] 100 % (03/23 0753) Last BM Date: 04/15/14  Intake/Output from previous day: 03/22 0701 - 03/23 0700 In: 2020 [P.O.:720; I.V.:1200; IV Piggyback:100] Out: 1445 [Urine:1025; Drains:220; Stool:200] Intake/Output this shift:    Physical Exam: HEENT - sclerae clear, mucous membranes moist Chest - breathing comfortably Abdomen - soft, obese; drains LUQ with purulent drainage; wound dry and intact  Lab Results:   Recent Labs  04/19/14 0630 04/21/14 0510  WBC 15.0* 12.5*  HGB 6.4* 8.7*  HCT 20.0* 26.8*  PLT 475* 498*   BMET  Recent Labs  04/20/14 0535 04/21/14 0510  NA 133* 136  K 3.8 3.8  CL 100 102  CO2 21 22  GLUCOSE 101* 120*  BUN 18 21  CREATININE 3.70* 3.94*  CALCIUM 8.1* 8.4   PT/INR No results for input(s): LABPROT, INR in the last 72 hours. Comprehensive Metabolic Panel:    Component Value Date/Time   NA 136 04/21/2014 0510   NA 133* 04/20/2014 0535   NA 140 02/16/2014 1443   K 3.8 04/21/2014 0510   K 3.8 04/20/2014 0535   K 3.8 02/16/2014 1443   CL 102 04/21/2014 0510   CL 100 04/20/2014 0535   CO2 22 04/21/2014 0510   CO2 21 04/20/2014 0535   CO2 27 02/16/2014 1443   BUN 21 04/21/2014 0510   BUN 18 04/20/2014 0535   BUN 7.7 02/16/2014 1443   CREATININE 3.94* 04/21/2014 0510   CREATININE 3.70* 04/20/2014 0535   CREATININE 0.7 02/16/2014 1443   CREATININE 0.99 01/04/2014 1107   CREATININE 0.66 05/03/2013 1306   GLUCOSE 120* 04/21/2014 0510   GLUCOSE 101* 04/20/2014 0535   GLUCOSE 97 02/16/2014 1443   CALCIUM 8.4 04/21/2014 0510   CALCIUM 8.1* 04/20/2014 0535   CALCIUM 9.3 02/16/2014 1443   AST 35 04/01/2014 1400   AST 24 02/16/2014 1443   AST 29 01/04/2014 1107   ALT 21 04/01/2014 1400   ALT 10 02/16/2014 1443   ALT 10 01/04/2014 1107   ALKPHOS 75 04/01/2014 1400   ALKPHOS 96 02/16/2014 1443   ALKPHOS 80 01/04/2014 1107   BILITOT 0.7 04/01/2014 1400   BILITOT 0.42 02/16/2014 1443   BILITOT 0.5 01/04/2014 1107   PROT 7.6 04/01/2014 1400   PROT 7.2 02/16/2014 1443   PROT 6.8 01/04/2014 1107   PROT 6.9 05/12/2013 1453   ALBUMIN 3.9 04/01/2014 1400   ALBUMIN 3.6 02/16/2014 1443   ALBUMIN 3.6 01/04/2014 1107    Studies/Results: US Renal  04/20/2014   CLINICAL DATA:  Acute renal failure  EXAM: RENAL/URINARY TRACT ULTRASOUND COMPLETE  COMPARISON:  04/15/2014  FINDINGS: Right Kidney:  Length: 11.7 cm. Echogenicity within normal limits. No mass or hydronephrosis visualized.  Left Kidney:  Length: 12.0 cm. A 2.4 cm cyst is noted in the midportion of the left kidney posteriorly. This corresponds to that seen on recent CT examination.  Bladder:  Decompressed by Foley catheter.  IMPRESSION: Left renal cyst.   Electronically Signed   By: Elta Guadeloupe  Lukens M.D.   On: 04/20/2014 11:09    Anti-infectives: Anti-infectives    Start     Dose/Rate Route Frequency Ordered Stop   04/21/14 1000  ciprofloxacin (CIPRO) tablet 500 mg     500 mg Oral Every 24 hours 04/21/14 0849     04/20/14 1400  piperacillin-tazobactam (ZOSYN) IVPB 2.25 g  Status:  Discontinued     2.25 g 100 mL/hr over 30 Minutes Intravenous Every 6 hours 04/20/14 0713 04/21/14 0849   04/15/14 1800  vancomycin (VANCOCIN) 1,250 mg in sodium chloride 0.9 % 250 mL IVPB  Status:  Discontinued     1,250 mg 166.7 mL/hr over 90 Minutes Intravenous Every 12 hours 04/15/14 0801 04/17/14 0643   04/15/14 1500  piperacillin-tazobactam (ZOSYN) IVPB 3.375 g  Status:  Discontinued     3.375 g 12.5 mL/hr over 240  Minutes Intravenous 3 times per day 04/15/14 0801 04/20/14 1316   04/15/14 0900  vancomycin (VANCOCIN) 1,500 mg in sodium chloride 0.9 % 500 mL IVPB     1,500 mg 250 mL/hr over 120 Minutes Intravenous  Once 04/15/14 0801 04/15/14 1231   04/15/14 0830  piperacillin-tazobactam (ZOSYN) IVPB 3.375 g     3.375 g 100 mL/hr over 30 Minutes Intravenous  Once 04/15/14 0801 04/15/14 0928   04/08/14 1600  cefoTEtan (CEFOTAN) 2 g in dextrose 5 % 50 mL IVPB     2 g 100 mL/hr over 30 Minutes Intravenous Every 12 hours 04/08/14 1522 04/09/14 0530   04/08/14 0537  ceFAZolin (ANCEF) IVPB 2 g/50 mL premix     2 g 100 mL/hr over 30 Minutes Intravenous On call to O.R. 04/08/14 0537 04/08/14 0759      Assessment & Plans: Status post robotic / lap resection Left subphrenic abscess LLL consolidation / pneumonia Acute renal insufficiency Leukocytosis (?splenectomy)    Soft diet  OOB, ambulate, IS use, pulm toilet  Repeat labs in AM  D/c staples.    Try again to d/c foley.     PICC line in place  Consult nephrology  sw vs care management.  [facility] Office: [place] 04/21/2014

## 2014-04-21 NOTE — Progress Notes (Signed)
[facility] Progress Note   Subjective: Cr 3.9 today, good UOP.  No new problems.   Filed Vitals:   04/21/14 0215 04/21/14 0400 04/21/14 0524 04/21/14 0753  BP: 104/59  114/57   Pulse: 89  81   Temp: 97.9 F (36.6 C)  98.1 F (36.7 C)   TempSrc: Oral  Oral   Resp: 9 9 10 14   Height:      Weight:      SpO2: 90% 100% 100% 100%   Exam: Alert, no distress, is lethargic and some myoclonic jerking No rash, cyanosis or gangrene Sclera anicteric, throat clear No jvd Chest clear bilat, no rales or wheezing RRR no MRG Abd obese, drain L side, nontender, diminished BS but present 1-2 + pitting edema bilat LE's, no UE edema Foley draining clear yellow urine in good amts Neuro is alert, Ox 3, no asterixis  UA 3/16 1.009, neg protein, 3-6wbc, 0-2 rbc UA 3/22 1.009, 6.0, 30 prot, 0-2 wbc/ rbc per hpf UNa 31, UCr 59 CXR 3/19 dense consolidation L base Wt's pending today I/O last 7days 11in and 7L out, +4L   Assessment/Plan: 1. Acute renal failure - nonoliguric ATN from contrast / Vanc.  Vanc stopped. Creat up slightly today. BP's remain soft, have stopped diltiazem. Will stop propranolol as well, let BP come up.  Increase IVF to 75 cc/hr. Labs in am 2. Panc Ca, s/p distal pancreatectomy 3. HTN on dilt/ propranolol at home 4. Hx DVT/PE 5. Lethargy / myoclonus - starting to get narcotic overload, AKI contributing  Rec- would d/c IV narcotics for now, use Tramadol/ tylenol if possible or at most po narcotics. Check creat am, should recover soon.     Kelly Splinter MD  pager [phone]    cell [phone]  04/21/2014, 8:40 AM     Recent Labs Lab 04/19/14 0630 04/20/14 0535 04/21/14 0510  NA 131* 133* 136  K 3.5 3.8 3.8  CL 98 100 102  CO2 23 21 22   GLUCOSE 124* 101* 120*  BUN 17 18 21   CREATININE 3.33* 3.70* 3.94*  CALCIUM 8.0* 8.1* 8.4   No results for input(s): AST, ALT, ALKPHOS, BILITOT, PROT, ALBUMIN in the last 168 hours.  Recent Labs Lab  04/15/14 0504 04/18/14 0545 04/19/14 0630 04/21/14 0510  WBC 19.1* 20.0* 15.0* 12.5*  NEUTROABS 15.1*  --   --   --   HGB 7.3* 7.4* 6.4* 8.7*  HCT 22.0* 22.7* 20.0* 26.8*  MCV 91.3 92.3 93.5 92.4  PLT 320 501* 475* 498*   . antiseptic oral rinse  7 mL Mouth Rinse q12n4p  . atorvastatin  40 mg Oral q1800  . bisacodyl  10 mg Rectal Daily  . brimonidine  1 drop Left Eye BID   And  . timolol  1 drop Left Eye BID  . chlorhexidine  15 mL Mouth Rinse BID  . enoxaparin (LOVENOX) injection  100 mg Subcutaneous Q24H  . gabapentin  300 mg Oral QHS  . latanoprost  1 drop Both Eyes QHS  . morphine   Intravenous 6 times per day  . pantoprazole (PROTONIX) IV  40 mg Intravenous Q12H  . piperacillin-tazobactam (ZOSYN)  IV  2.25 g Intravenous Q6H  . primidone  50 mg Oral QHS  . propranolol ER  60 mg Oral Daily  . sodium chloride  10-40 mL Intracatheter Q12H  . tamsulosin  0.4 mg Oral Daily   . dextrose 5 % and 0.45 % NaCl with KCl 20 mEq/L 1,000 mL (  04/19/14 2211)   acetaminophen, albuterol, alum & mag hydroxide-simeth, bisacodyl, diphenhydrAMINE **OR** diphenhydrAMINE, HYDROcodone-acetaminophen, lip balm, morphine injection, naloxone **AND** sodium chloride, ondansetron **OR** ondansetron (ZOFRAN) IV, sodium chloride, traMADol

## 2014-04-21 NOTE — Progress Notes (Signed)
Patient ID: [name], female   DOB: 01-10-1946, 69 y.o.   MRN: [id number]    Referring Physician(s): CCS  Subjective:  Pt without new c/o ; sitting up in chair; eating lunch  Allergies: Sulfa antibiotics  Medications: Prior to Admission medications   Medication Sig Start Date End Date Taking? Authorizing Provider  alum & mag hydroxide-simeth (MAALOX PLUS) 400-400-40 MG/5ML suspension Take 30 mLs by mouth every 6 (six) hours as needed for indigestion.   Yes Historical Provider, MD  atorvastatin (LIPITOR) 40 MG tablet TAKE 1 TABLET BY MOUTH DAILY 03/28/14  Yes Mancel Bale, PA-C  brimonidine-timolol (COMBIGAN) 0.2-0.5 % ophthalmic solution Place 1 drop into the left eye every 12 (twelve) hours.   Yes Historical Provider, MD  CARTIA XT 120 MG 24 hr capsule TAKE ONE CAPSULE BY MOUTH DAILY Patient taking differently: TAKE ONE CAPSULE BY MOUTH DAILY IN THE EVENING 03/28/14  Yes Mancel Bale, PA-C  enoxaparin (LOVENOX) 100 MG/ML injection Inject 1 mL (100 mg total) into the skin every 12 (twelve) hours. 02/16/14  Yes Truitt Merle, MD  gabapentin (NEURONTIN) 300 MG capsule TAKE ONE CAPSULE BY MOUTH AT BEDTIME; AFTER 7 DAYS, MAY INCREASE TO 2 CAPSULES AT BEDTIME 03/28/14  Yes Mancel Bale, PA-C  HYDROcodone-acetaminophen (NORCO/VICODIN) 5-325 MG per tablet Take 1 tablet by mouth every 6 (six) hours as needed for moderate pain. Patient taking differently: Take 2 tablets by mouth every 4 (four) hours as needed for moderate pain.  12/31/13  Yes Wardell Honour, MD  latanoprost (XALATAN) 0.005 % ophthalmic solution Place 1 drop into both eyes at bedtime.   Yes Historical Provider, MD  pantoprazole (PROTONIX) 40 MG tablet TAKE 1 TABLET BY MOUTH DAILY 03/28/14  Yes Leandrew Koyanagi, MD  primidone (MYSOLINE) 50 MG tablet Take 50 mg by mouth daily. Patient takes at nite   Yes Historical Provider, MD  propranolol ER (INDERAL LA) 120 MG 24 hr capsule TAKE ONE CAPSULE BY MOUTH DAILY Patient taking  differently: TAKE ONE CAPSULE BY MOUTH DAILY AT BEDTIME 03/28/14  Yes Mancel Bale, PA-C  acetaminophen (TYLENOL) 500 MG tablet Take 500 mg by mouth every 6 (six) hours as needed (Pain).    Historical Provider, MD  doxycycline (VIBRA-TABS) 100 MG tablet Take 1 tablet (100 mg total) by mouth 2 (two) times daily. 03/19/14   Golden Circle, FNP  traMADol (ULTRAM) 50 MG tablet Take 1 tablet (50 mg total) by mouth every 8 (eight) hours as needed. Patient not taking: Reported on 03/29/2014 03/19/14   Golden Circle, FNP     Vital Signs: BP 113/57 mmHg  Pulse 91  Temp(Src) 97.7 F (36.5 C) (Oral)  Resp 15  Ht 5' 9.5" (1.765 m)  Wt 213 lb 6.4 oz (96.798 kg)  BMI 31.07 kg/m2  SpO2 100%  Physical Exam pt awake/responds appropriately to questions; LUQ drain intact, insertion site ok,NT, output 140 cc's turbid, beige fluid; drain flushed with 5 cc's sterile NS with return of same amt  Imaging: US Renal  04/20/2014   CLINICAL DATA:  Acute renal failure  EXAM: RENAL/URINARY TRACT ULTRASOUND COMPLETE  COMPARISON:  04/15/2014  FINDINGS: Right Kidney:  Length: 11.7 cm. Echogenicity within normal limits. No mass or hydronephrosis visualized.  Left Kidney:  Length: 12.0 cm. A 2.4 cm cyst is noted in the midportion of the left kidney posteriorly. This corresponds to that seen on recent CT examination.  Bladder:  Decompressed by Foley catheter.  IMPRESSION: Left renal cyst.  Electronically Signed   By: Inez Catalina M.D.   On: 04/20/2014 11:09    Labs:  CBC:  Recent Labs  04/15/14 0504 04/18/14 0545 04/19/14 0630 04/21/14 0510  WBC 19.1* 20.0* 15.0* 12.5*  HGB 7.3* 7.4* 6.4* 8.7*  HCT 22.0* 22.7* 20.0* 26.8*  PLT 320 501* 475* 498*    COAGS:  Recent Labs  04/01/14 1400 04/08/14 0630 04/09/14 0404  INR 1.11 1.06 1.12  APTT  --  40* 33    BMP:  Recent Labs  04/18/14 0545 04/19/14 0630 04/20/14 0535 04/21/14 0510  NA 135 131* 133* 136  K 3.8 3.5 3.8 3.8  CL 98 98 100 102  CO2  23 23 21 22   GLUCOSE 122* 124* 101* 120*  BUN 15 17 18 21   CALCIUM 8.3* 8.0* 8.1* 8.4  CREATININE 2.59* 3.33* 3.70* 3.94*  GFRNONAA 18* 13* 12* 11*  GFRAA 21* 15* 14* 13*    LIVER FUNCTION TESTS:  Recent Labs  05/03/13 1306 05/12/13 1453 01/04/14 1107 02/16/14 1443 04/01/14 1400  BILITOT 0.6  --  0.5 0.42 0.7  AST 26  --  29 24 35  ALT 8  --  10 10 21   ALKPHOS 109  --  80 96 75  PROT 7.2 6.9 6.8 7.2 7.6  ALBUMIN 3.8  --  3.6 3.6 3.9    Assessment and Plan: S/p LUQ abscess drainage 3/18 (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

## 2014-04-22 ENCOUNTER — Encounter (HOSPITAL_COMMUNITY): Payer: Self-pay | Admitting: Nephrology

## 2014-04-22 LAB — CBC
HEMATOCRIT: 26.2 % — AB (ref 36.0–46.0)
HEMOGLOBIN: 8.6 g/dL — AB (ref 12.0–15.0)
MCH: 30.4 pg (ref 26.0–34.0)
MCHC: 32.8 g/dL (ref 30.0–36.0)
MCV: 92.6 fL (ref 78.0–100.0)
Platelets: 508 10*3/uL — ABNORMAL HIGH (ref 150–400)
RBC: 2.83 MIL/uL — ABNORMAL LOW (ref 3.87–5.11)
RDW: 14.2 % (ref 11.5–15.5)
WBC: 11.1 10*3/uL — ABNORMAL HIGH (ref 4.0–10.5)

## 2014-04-22 LAB — BASIC METABOLIC PANEL
Anion gap: 9 (ref 5–15)
BUN: 21 mg/dL (ref 6–23)
CHLORIDE: 104 mmol/L (ref 96–112)
CO2: 22 mmol/L (ref 19–32)
CREATININE: 3.82 mg/dL — AB (ref 0.50–1.10)
Calcium: 8.5 mg/dL (ref 8.4–10.5)
GFR calc Af Amer: 13 mL/min — ABNORMAL LOW (ref >90)
GFR calc non Af Amer: 11 mL/min — ABNORMAL LOW (ref >90)
GLUCOSE: 119 mg/dL — AB (ref 70–99)
Potassium: 3.9 mmol/L (ref 3.5–5.1)
Sodium: 135 mmol/L (ref 135–145)

## 2014-04-22 MED ORDER — TRAMADOL HCL 50 MG PO TABS
50.0000 mg | ORAL_TABLET | Freq: Four times a day (QID) | ORAL | Status: DC
  Administered 2014-04-22 – 2014-04-29 (×22): 50 mg via ORAL
  Filled 2014-04-22 (×25): qty 1

## 2014-04-22 MED ORDER — PROPRANOLOL HCL ER 60 MG PO CP24
60.0000 mg | ORAL_CAPSULE | Freq: Every day | ORAL | Status: DC
  Administered 2014-04-22: 60 mg via ORAL
  Filled 2014-04-22: qty 1

## 2014-04-22 MED ORDER — FUROSEMIDE 10 MG/ML IJ SOLN
80.0000 mg | Freq: Once | INTRAMUSCULAR | Status: AC
  Administered 2014-04-22: 80 mg via INTRAVENOUS
  Filled 2014-04-22: qty 8

## 2014-04-22 MED ORDER — ACETAMINOPHEN 325 MG PO TABS
650.0000 mg | ORAL_TABLET | Freq: Four times a day (QID) | ORAL | Status: DC
  Administered 2014-04-22 – 2014-05-04 (×38): 650 mg via ORAL
  Filled 2014-04-22 (×55): qty 2

## 2014-04-22 NOTE — Progress Notes (Signed)
Lasix 80mg  inj ordered. BP prior to injection was 122/68. After injection was 120/70. Pt tolerated well. Will continue to monitor.

## 2014-04-22 NOTE — Progress Notes (Signed)
OT Cancellation Note  Patient Details Name: [name] MRN: [id number] DOB: May 24, 1945   Cancelled Treatment:    Noted plans for SNF . Will defer OT eval and tx to SNF. Kari Baars, [place] Payton Mccallum D 04/22/2014, 12:05 PM

## 2014-04-22 NOTE — Progress Notes (Signed)
Patient ID: [name], female   DOB: 1945-07-31, 69 y.o.   MRN: [id number]    Referring Physician(s): CCS  Subjective:  Pt states she has some left chest /upper abd discomfort made worse with deep breathing, possibly diaphragmatic irritation from drain; denies N/V, appetite fair  Allergies: Sulfa antibiotics  Medications: Prior to Admission medications   Medication Sig Start Date End Date Taking? Authorizing Provider  alum & mag hydroxide-simeth (MAALOX PLUS) 400-400-40 MG/5ML suspension Take 30 mLs by mouth every 6 (six) hours as needed for indigestion.   Yes Historical Provider, MD  atorvastatin (LIPITOR) 40 MG tablet TAKE 1 TABLET BY MOUTH DAILY 03/28/14  Yes Mancel Bale, PA-C  brimonidine-timolol (COMBIGAN) 0.2-0.5 % ophthalmic solution Place 1 drop into the left eye every 12 (twelve) hours.   Yes Historical Provider, MD  CARTIA XT 120 MG 24 hr capsule TAKE ONE CAPSULE BY MOUTH DAILY Patient taking differently: TAKE ONE CAPSULE BY MOUTH DAILY IN THE EVENING 03/28/14  Yes Mancel Bale, PA-C  enoxaparin (LOVENOX) 100 MG/ML injection Inject 1 mL (100 mg total) into the skin every 12 (twelve) hours. 02/16/14  Yes Truitt Merle, MD  gabapentin (NEURONTIN) 300 MG capsule TAKE ONE CAPSULE BY MOUTH AT BEDTIME; AFTER 7 DAYS, MAY INCREASE TO 2 CAPSULES AT BEDTIME 03/28/14  Yes Mancel Bale, PA-C  HYDROcodone-acetaminophen (NORCO/VICODIN) 5-325 MG per tablet Take 1 tablet by mouth every 6 (six) hours as needed for moderate pain. Patient taking differently: Take 2 tablets by mouth every 4 (four) hours as needed for moderate pain.  12/31/13  Yes Wardell Honour, MD  latanoprost (XALATAN) 0.005 % ophthalmic solution Place 1 drop into both eyes at bedtime.   Yes Historical Provider, MD  pantoprazole (PROTONIX) 40 MG tablet TAKE 1 TABLET BY MOUTH DAILY 03/28/14  Yes Leandrew Koyanagi, MD  primidone (MYSOLINE) 50 MG tablet Take 50 mg by mouth daily. Patient takes at nite   Yes Historical Provider, MD    propranolol ER (INDERAL LA) 120 MG 24 hr capsule TAKE ONE CAPSULE BY MOUTH DAILY Patient taking differently: TAKE ONE CAPSULE BY MOUTH DAILY AT BEDTIME 03/28/14  Yes Mancel Bale, PA-C  acetaminophen (TYLENOL) 500 MG tablet Take 500 mg by mouth every 6 (six) hours as needed (Pain).    Historical Provider, MD  doxycycline (VIBRA-TABS) 100 MG tablet Take 1 tablet (100 mg total) by mouth 2 (two) times daily. 03/19/14   Golden Circle, FNP  traMADol (ULTRAM) 50 MG tablet Take 1 tablet (50 mg total) by mouth every 8 (eight) hours as needed. Patient not taking: Reported on 03/29/2014 03/19/14   Golden Circle, FNP     Vital Signs: BP 116/70 mmHg  Pulse 91  Temp(Src) 97.8 F (36.6 C) (Oral)  Resp 16  Ht 5' 9.5" (1.765 m)  Wt 236 lb 15.9 oz (107.5 kg)  BMI 34.51 kg/m2  SpO2 98%  Physical Exam pt awake/alert; LUQ drain intact, insertion site ok, mildly tender, output decreasing, 5-10 cc's recorded today; drain flushed with return of same amt  Imaging: US Renal  04/20/2014   CLINICAL DATA:  Acute renal failure  EXAM: RENAL/URINARY TRACT ULTRASOUND COMPLETE  COMPARISON:  04/15/2014  FINDINGS: Right Kidney:  Length: 11.7 cm. Echogenicity within normal limits. No mass or hydronephrosis visualized.  Left Kidney:  Length: 12.0 cm. A 2.4 cm cyst is noted in the midportion of the left kidney posteriorly. This corresponds to that seen on recent CT examination.  Bladder:  Decompressed  by Foley catheter.  IMPRESSION: Left renal cyst.   Electronically Signed   By: Inez Catalina M.D.   On: 04/20/2014 11:09    Labs:  CBC:  Recent Labs  04/18/14 0545 04/19/14 0630 04/21/14 0510 04/22/14 0442  WBC 20.0* 15.0* 12.5* 11.1*  HGB 7.4* 6.4* 8.7* 8.6*  HCT 22.7* 20.0* 26.8* 26.2*  PLT 501* 475* 498* 508*    COAGS:  Recent Labs  04/01/14 1400 04/08/14 0630 04/09/14 0404  INR 1.11 1.06 1.12  APTT  --  40* 33    BMP:  Recent Labs  04/19/14 0630 04/20/14 0535 04/21/14 0510 04/22/14 0442   NA 131* 133* 136 135  K 3.5 3.8 3.8 3.9  CL 98 100 102 104  CO2 23 21 22 22   GLUCOSE 124* 101* 120* 119*  BUN 17 18 21 21   CALCIUM 8.0* 8.1* 8.4 8.5  CREATININE 3.33* 3.70* 3.94* 3.82*  GFRNONAA 13* 12* 11* 11*  GFRAA 15* 14* 13* 13*    LIVER FUNCTION TESTS:  Recent Labs  05/03/13 1306 05/12/13 1453 01/04/14 1107 02/16/14 1443 04/01/14 1400  BILITOT 0.6  --  0.5 0.42 0.7  AST 26  --  29 24 35  ALT 8  --  10 10 21   ALKPHOS 109  --  80 96 75  PROT 7.2 6.9 6.8 7.2 7.6  ALBUMIN 3.8  --  3.6 3.6 3.9    Assessment and Plan:  S/p LUQ abscess drainage 3/18 (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

## 2014-04-22 NOTE — Progress Notes (Addendum)
  [facility] Progress Note   Subjective: Cr down today , 3.8.    Filed Vitals:   04/22/14 0506 04/22/14 1019 04/22/14 1037 04/22/14 1400  BP: 127/65 115/42 116/70 123/74  Pulse: 55 91 91 79  Temp: 99 F (37.2 C) 97.8 F (36.6 C)  98 F (36.7 C)  TempSrc: Oral Oral  Oral  Resp: 16   14  Height:      Weight: 107.5 kg (236 lb 15.9 oz)     SpO2: 96% 98%  98%   Exam: Alert, still lethargic/ slurred speech and mild myoclonus but improved from yest No jvd Chest clear bilat, no rales or wheezing RRR no MRG Abd obese, drain L side, nontender, diminished BS but present 2 + pitting edema bilat LE's, no UE edema Neuro nonfocal  UA 3/16 1.009, neg protein, 3-6wbc, 0-2 rbc UA 3/22 1.009, 6.0, 30 prot, 0-2 wbc/ rbc per hpf UNa 31, UCr 59 CXR 3/19 dense consolidation L base Wt's pending today I/O since admit > 35L in and 24L out, +11L  Assessment: 1. Acute renal failure -  nonoliguric ATN from contrast / IV vanc; starting to improve. Vanc has been dc'd. IVF"s stopped d/t vol overload. 2. Vol excess -  Up 12 kg from admission, no resp issues. Plan lasix IV x 1 tonight.  3. Panc Ca, s/p distal pancreatectomy 4. HTN - BP's soft, holding home dilt/ propranolol 5. Hx DVT/PE 6. Lethargy / myoclonus - from narcotics/ neurontin which have been dc'd, getting tylenol/ Ultram prn  Rec- labs in am, lasix x 1. Starting to recover    Kelly Splinter MD  pager [phone]    cell [phone]  04/22/2014, 4:30 PM     Recent Labs Lab 04/20/14 0535 04/21/14 0510 04/22/14 0442  NA 133* 136 135  K 3.8 3.8 3.9  CL 100 102 104  CO2 21 22 22   GLUCOSE 101* 120* 119*  BUN 18 21 21   CREATININE 3.70* 3.94* 3.82*  CALCIUM 8.1* 8.4 8.5   No results for input(s): AST, ALT, ALKPHOS, BILITOT, PROT, ALBUMIN in the last 168 hours.  Recent Labs Lab 04/19/14 0630 04/21/14 0510 04/22/14 0442  WBC 15.0* 12.5* 11.1*  HGB 6.4* 8.7* 8.6*  HCT 20.0* 26.8* 26.2*  MCV 93.5 92.4 92.6  PLT  475* 498* 508*   . acetaminophen  650 mg Oral Q6H  . atorvastatin  40 mg Oral q1800  . bisacodyl  10 mg Rectal Daily  . brimonidine  1 drop Left Eye BID   And  . timolol  1 drop Left Eye BID  . ciprofloxacin  500 mg Oral Q24H  . enoxaparin (LOVENOX) injection  100 mg Subcutaneous Q24H  . latanoprost  1 drop Both Eyes QHS  . pantoprazole  40 mg Oral Q1200  . primidone  50 mg Oral QHS  . propranolol ER  60 mg Oral Daily  . sodium chloride  10-40 mL Intracatheter Q12H  . tamsulosin  0.4 mg Oral Daily  . traMADol  50 mg Oral 4 times per day     albuterol, alum & mag hydroxide-simeth, bisacodyl, lip balm, ondansetron **OR** ondansetron (ZOFRAN) IV, sodium chloride

## 2014-04-22 NOTE — Progress Notes (Signed)
Physical Therapy Treatment Patient Details Name: [name] MRN: [id number] DOB: 02-21-1945 Today's Date: 04/22/2014    History of Present Illness 69 yo female s/p lap pancreatectomy, splenectomy, partial colectomy 04/08/14. hx of pancreatic cancer, glaucoma, DVT, peripheral neuropathy    PT Comments    Much improved! Pt motivated to ambulate this session. Able to walk ~75 feet with rollator. +2 for safety still due to pt fatigues fairly quickly. Continue to recommend ST rehab at [facility].   Follow Up Recommendations  SNF;Supervision/Assistance - 24 hour     Equipment Recommendations  None recommended by PT    Recommendations for Other Services OT consult     Precautions / Restrictions Precautions Precautions: Fall Precaution Comments: abdominal surgery    Mobility  Bed Mobility               General bed mobility comments: pt oob in recliner  Transfers Overall transfer level: Needs assistance Equipment used: Rolling walker (2 wheeled) Transfers: Sit to/from Stand Sit to Stand: Mod assist;+2 safety/equipment         General transfer comment: Assist to rise, stabilize, control descent. Multimodal cues for safety, technique, hand placement.   Ambulation/Gait Ambulation/Gait assistance: +2 physical assistance;Min assist;+2 safety/equipment Ambulation Distance (Feet): 75 Feet Assistive device: 4-wheeled walker Gait Pattern/deviations: Step-through pattern;Trunk flexed     General Gait Details: Assist to stabilize. Followed closely with recliner. Fatigues fairly easily.    Stairs            Wheelchair Mobility    Modified Rankin (Stroke Patients Only)       Balance                                    Cognition Arousal/Alertness: Awake/alert Behavior During Therapy: WFL for tasks assessed/performed Overall Cognitive Status: Within Functional Limits for tasks assessed                      Exercises      General  Comments        Pertinent Vitals/Pain Pain Assessment: 0-10 Pain Score: 7  Pain Location: abdomen Pain Descriptors / Indicators: Sore Pain Intervention(s): Monitored during session    Home Living                      Prior Function            PT Goals (current goals can now be found in the care plan section) Progress towards PT goals: Progressing toward goals    Frequency  Min 3X/week    PT Plan Current plan remains appropriate    Co-evaluation             End of Session   Activity Tolerance: Patient limited by fatigue Patient left: in chair;with call bell/phone within reach;with family/visitor present     Time: 1219-1229 PT Time Calculation (min) (ACUTE ONLY): 10 min  Charges:  $Gait Training: 8-22 mins                    G Codes:      Weston Anna, MPT Pager: [phone]

## 2014-04-22 NOTE — Progress Notes (Signed)
Patient ID: [name], female   DOB: [birth year], 69 y.o.   MRN: [id number]  [facility]  POD#: 14  Subjective: Pt complains of difficulty when she takes a deep breath.  Still having some jerky movements.  Was still unable to void.  Had to have foley replaced.    Objective: Vital signs in last 24 hours: Temp:  [97.7 F (36.5 C)-99 F (37.2 C)] 99 F (37.2 C) (03/24 0506) Pulse Rate:  [55-105] 55 (03/24 0506) Resp:  [12-16] 16 (03/24 0506) BP: (96-135)/(42-75) 127/65 mmHg (03/24 0506) SpO2:  [96 %-100 %] 96 % (03/24 0506) Weight:  [96.798 kg (213 lb 6.4 oz)-107.5 kg (236 lb 15.9 oz)] 107.5 kg (236 lb 15.9 oz) (03/24 0506) Last BM Date: 04/15/14  Intake/Output from previous day: 03/23 0701 - 03/24 0700 In: 1520 [P.O.:360; I.V.:1150] Out: 1082 [Urine:1050; Drains:30; Stool:2] Intake/Output this shift:    Physical Exam: HEENT - sclerae clear, mucous membranes moist Chest - breathing comfortably Abdomen - soft, obese; drains LUQ with purulent drainage; wound dry and intact, staples out.  Sl distended today.  Lab Results:   Recent Labs  04/21/14 0510 04/22/14 0442  WBC 12.5* 11.1*  HGB 8.7* 8.6*  HCT 26.8* 26.2*  PLT 498* 508*   BMET  Recent Labs  04/21/14 0510 04/22/14 0442  NA 136 135  K 3.8 3.9  CL 102 104  CO2 22 22  GLUCOSE 120* 119*  BUN 21 21  CREATININE 3.94* 3.82*  CALCIUM 8.4 8.5   PT/INR No results for input(s): LABPROT, INR in the last 72 hours. Comprehensive Metabolic Panel:    Component Value Date/Time   NA 135 04/22/2014 0442   NA 136 04/21/2014 0510   NA 140 02/16/2014 1443   K 3.9 04/22/2014 0442   K 3.8 04/21/2014 0510   K 3.8 02/16/2014 1443   CL 104 04/22/2014 0442   CL 102 04/21/2014 0510   CO2 22 04/22/2014 0442   CO2 22 04/21/2014 0510   CO2 27 02/16/2014 1443   BUN 21 04/22/2014 0442   BUN 21 04/21/2014 0510   BUN 7.7 02/16/2014 1443   CREATININE 3.82* 04/22/2014 0442   CREATININE 3.94* 04/21/2014 0510   CREATININE 0.7 02/16/2014 1443   CREATININE 0.99 01/04/2014 1107   CREATININE 0.66 05/03/2013 1306   GLUCOSE 119* 04/22/2014 0442   GLUCOSE 120* 04/21/2014 0510   GLUCOSE 97 02/16/2014 1443   CALCIUM 8.5 04/22/2014 0442   CALCIUM 8.4 04/21/2014 0510   CALCIUM 9.3 02/16/2014 1443   AST 35 04/01/2014 1400   AST 24 02/16/2014 1443   AST 29 01/04/2014 1107   ALT 21 04/01/2014 1400   ALT 10 02/16/2014 1443   ALT 10 01/04/2014 1107   ALKPHOS 75 04/01/2014 1400   ALKPHOS 96 02/16/2014 1443   ALKPHOS 80 01/04/2014 1107   BILITOT 0.7 04/01/2014 1400   BILITOT 0.42 02/16/2014 1443   BILITOT 0.5 01/04/2014 1107   PROT 7.6 04/01/2014 1400   PROT 7.2 02/16/2014 1443   PROT 6.8 01/04/2014 1107   PROT 6.9 05/12/2013 1453   ALBUMIN 3.9 04/01/2014 1400   ALBUMIN 3.6 02/16/2014 1443   ALBUMIN 3.6 01/04/2014 1107    Studies/Results: US Renal  04/20/2014   CLINICAL DATA:  Acute renal failure  EXAM: RENAL/URINARY TRACT ULTRASOUND COMPLETE  COMPARISON:  04/15/2014  FINDINGS: Right Kidney:  Length: 11.7 cm. Echogenicity within normal limits. No mass or hydronephrosis visualized.  Left Kidney:  Length: 12.0 cm.  A 2.4 cm cyst is noted in the midportion of the left kidney posteriorly. This corresponds to that seen on recent CT examination.  Bladder:  Decompressed by Foley catheter.  IMPRESSION: Left renal cyst.   Electronically Signed   By: Inez Catalina M.D.   On: 04/20/2014 11:09    Anti-infectives: Anti-infectives    Start     Dose/Rate Route Frequency Ordered Stop   04/21/14 1000  ciprofloxacin (CIPRO) tablet 500 mg     500 mg Oral Every 24 hours 04/21/14 0849     04/20/14 1400  piperacillin-tazobactam (ZOSYN) IVPB 2.25 g  Status:  Discontinued     2.25 g 100 mL/hr over 30 Minutes Intravenous Every 6 hours 04/20/14 0713 04/21/14 0849   04/15/14 1800  vancomycin (VANCOCIN) 1,250 mg in sodium chloride 0.9 % 250 mL IVPB  Status:  Discontinued     1,250 mg 166.7  mL/hr over 90 Minutes Intravenous Every 12 hours 04/15/14 0801 04/17/14 0643   04/15/14 1500  piperacillin-tazobactam (ZOSYN) IVPB 3.375 g  Status:  Discontinued     3.375 g 12.5 mL/hr over 240 Minutes Intravenous 3 times per day 04/15/14 0801 04/20/14 1316   04/15/14 0900  vancomycin (VANCOCIN) 1,500 mg in sodium chloride 0.9 % 500 mL IVPB     1,500 mg 250 mL/hr over 120 Minutes Intravenous  Once 04/15/14 0801 04/15/14 1231   04/15/14 0830  piperacillin-tazobactam (ZOSYN) IVPB 3.375 g     3.375 g 100 mL/hr over 30 Minutes Intravenous  Once 04/15/14 0801 04/15/14 0928   04/08/14 1600  cefoTEtan (CEFOTAN) 2 g in dextrose 5 % 50 mL IVPB     2 g 100 mL/hr over 30 Minutes Intravenous Every 12 hours 04/08/14 1522 04/09/14 0530   04/08/14 0537  ceFAZolin (ANCEF) IVPB 2 g/50 mL premix     2 g 100 mL/hr over 30 Minutes Intravenous On call to O.R. 04/08/14 0537 04/08/14 0759      Assessment & Plans: Status post robotic / lap resection Left subphrenic abscess LLL consolidation / pneumonia Acute renal insufficiency Leukocytosis (?splenectomy)    Soft diet  OOB, ambulate, IS use, pulm toilet  Repeat labs in AM  Saline lock ivf.    Try again to d/c foley.     PICC line in place  neprhology  sw vs care management. Work toward discharge plan.    [facility] Office: [place] 04/22/2014

## 2014-04-23 ENCOUNTER — Other Ambulatory Visit: Payer: Medicare Other

## 2014-04-23 ENCOUNTER — Ambulatory Visit: Payer: Medicare Other | Admitting: Hematology

## 2014-04-23 LAB — BASIC METABOLIC PANEL
ANION GAP: 10 (ref 5–15)
BUN: 20 mg/dL (ref 6–23)
CALCIUM: 8.3 mg/dL — AB (ref 8.4–10.5)
CHLORIDE: 105 mmol/L (ref 96–112)
CO2: 22 mmol/L (ref 19–32)
Creatinine, Ser: 3.88 mg/dL — ABNORMAL HIGH (ref 0.50–1.10)
GFR calc Af Amer: 13 mL/min — ABNORMAL LOW (ref >90)
GFR calc non Af Amer: 11 mL/min — ABNORMAL LOW (ref >90)
GLUCOSE: 89 mg/dL (ref 70–99)
Potassium: 3.7 mmol/L (ref 3.5–5.1)
Sodium: 137 mmol/L (ref 135–145)

## 2014-04-23 LAB — CBC
HEMATOCRIT: 26.4 % — AB (ref 36.0–46.0)
Hemoglobin: 8.4 g/dL — ABNORMAL LOW (ref 12.0–15.0)
MCH: 29.6 pg (ref 26.0–34.0)
MCHC: 31.8 g/dL (ref 30.0–36.0)
MCV: 93 fL (ref 78.0–100.0)
Platelets: 552 10*3/uL — ABNORMAL HIGH (ref 150–400)
RBC: 2.84 MIL/uL — AB (ref 3.87–5.11)
RDW: 14.5 % (ref 11.5–15.5)
WBC: 9.1 10*3/uL (ref 4.0–10.5)

## 2014-04-23 LAB — HEPARIN ANTI-XA: Heparin LMW: 1.15 IU/mL

## 2014-04-23 MED ORDER — GABAPENTIN 300 MG PO CAPS
300.0000 mg | ORAL_CAPSULE | Freq: Every day | ORAL | Status: DC
  Administered 2014-04-23: 300 mg via ORAL
  Filled 2014-04-23 (×3): qty 1

## 2014-04-23 NOTE — Progress Notes (Signed)
CSW assisting with d/c planning. Pt / daughter visited by [facility] and [facility] reps 3/24. They are considering both facilities for ST Rehab though decision has been made. " I know I need to make a decision soon. "  CSW will continue to follow to assist with d/c planning to SNF.  Werner Lean LCSW [phone]

## 2014-04-23 NOTE — Progress Notes (Signed)
General Surgery Note  LOS: 15 days  POD -  15 Days Post-Op  Assessment/Plan: 1.  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  LUQ subphrenic abscess  Has 2 LUQ drains  Cipro  3.  Renal dysfunction  Creat - 3.88 - 04/23/2014  Followed by Dr. Jonnie Finner 4.  Urinary retention  Has foley 5.  Anemia  Hgb - 8.4 - 04/23/2014 6.  DVT prophylaxis - Lovenox 7.  Tremors  On Gabapentin and primidone   Active Problems:   Carcinoma of tail of pancreas   Abscess of abdominal cavity   Subjective:  Doing okay.  No specific problem. She wants to eat food from outside.  Daughter, Anael, in room with patient. Objective:   Filed Vitals:   04/23/14 0947  BP: 116/57  Pulse: 82  Temp: 98.5 F (36.9 C)  Resp: 16     Intake/Output from previous day:  03/24 0701 - 03/25 0700 In: 1260 [P.O.:1010; I.V.:240] Out: 3662 [Urine:2700; Drains:56]  Intake/Output this shift:  Total I/O In: 120 [P.O.:120] Out: 405 [Urine:400; Drains:5]   Physical Exam:   General: WN older AA F who is alert.   HEENT: Normal. Pupils equal. .   Lungs: Clear.  IS - 1,500 cc   Abdomen: Soft.  Has few BS   Wound: Upper midline wound looks okay    Drains - 1/2 - 51/5 cc last 24 hours      Lab Results:    Recent Labs  04/22/14 0442 04/23/14 0535  WBC 11.1* 9.1  HGB 8.6* 8.4*  HCT 26.2* 26.4*  PLT 508* 552*    BMET   Recent Labs  04/22/14 0442 04/23/14 0535  NA 135 137  K 3.9 3.7  CL 104 105  CO2 22 22  GLUCOSE 119* 89  BUN 21 20  CREATININE 3.82* 3.88*  CALCIUM 8.5 8.3*    PT/INR  No results for input(s): LABPROT, INR in the last 72 hours.  ABG  No results for input(s): PHART, HCO3 in the last 72 hours.  Invalid input(s): PCO2, PO2   Studies/Results:  No results found.   Anti-infectives:   Anti-infectives    Start     Dose/Rate Route  Frequency Ordered Stop   04/21/14 1000  ciprofloxacin (CIPRO) tablet 500 mg     500 mg Oral Every 24 hours 04/21/14 0849     04/20/14 1400  piperacillin-tazobactam (ZOSYN) IVPB 2.25 g  Status:  Discontinued     2.25 g 100 mL/hr over 30 Minutes Intravenous Every 6 hours 04/20/14 0713 04/21/14 0849   04/15/14 1800  vancomycin (VANCOCIN) 1,250 mg in sodium chloride 0.9 % 250 mL IVPB  Status:  Discontinued     1,250 mg 166.7 mL/hr over 90 Minutes Intravenous Every 12 hours 04/15/14 0801 04/17/14 0643   04/15/14 1500  piperacillin-tazobactam (ZOSYN) IVPB 3.375 g  Status:  Discontinued     3.375 g 12.5 mL/hr over 240 Minutes Intravenous 3 times per day 04/15/14 0801 04/20/14 1316   04/15/14 0900  vancomycin (VANCOCIN) 1,500 mg in sodium chloride 0.9 % 500 mL IVPB     1,500 mg 250 mL/hr over 120 Minutes Intravenous  Once 04/15/14 0801 04/15/14 1231   04/15/14 0830  piperacillin-tazobactam (ZOSYN) IVPB 3.375 g     3.375 g 100 mL/hr over 30 Minutes Intravenous  Once 04/15/14 0801 04/15/14 9476  04/08/14 1600  cefoTEtan (CEFOTAN) 2 g in dextrose 5 % 50 mL IVPB     2 g 100 mL/hr over 30 Minutes Intravenous Every 12 hours 04/08/14 1522 04/09/14 0530   04/08/14 0537  ceFAZolin (ANCEF) IVPB 2 g/50 mL premix     2 g 100 mL/hr over 30 Minutes Intravenous On call to O.R. 04/08/14 0537 04/08/14 0759      Alphonsa Overall, MD, FACS Pager: [facility] Office: [phone] 04/23/2014

## 2014-04-23 NOTE — Progress Notes (Signed)
  [facility] Progress Note   Subjective: 1.5 L diuresis yest w IV lasix x 1. Creat no chg at 3.8   Filed Vitals:   04/22/14 1037 04/22/14 1400 04/22/14 2115 04/23/14 0549  BP: 116/70 123/74 133/68 122/63  Pulse: 91 79 82 85  Temp:  98 F (36.7 C) 98.5 F (36.9 C) 98.2 F (36.8 C)  TempSrc:  Oral Oral Oral  Resp:  14 16 16   Height:      Weight:    101.4 kg (223 lb 8.7 oz)  SpO2:  98% 99% 97%   Exam: Alert, jerking resolved, responds appropriately No jvd Chest clear bilat, no rales or wheezing RRR no MRG Abd obese, drain L side, nontender, diminished BS 1+ edema bilat LE's Neuro nonfocal  UA 3/16 1.009, neg protein, 3-6wbc, 0-2 rbc UA 3/22 1.009, 6.0, 30 prot, 0-2 wbc/ rbc per hpf UNa 31, UCr 59 CXR 3/19 dense consolidation L base Wt's pending today I/O since admit > 35L in and 24L out, +11L  Assessment: 1. Acute renal failure -  nonoliguric ATN from contrast / IV vanc. Creat not down today because of lasix given yest. Will hold further diuretics. Should cont to improve.  Check daily creat.   2. Vol excess - stable 3. Panc Ca, s/p distal pancreatectomy 4. HTN - BP's good, continue to hold BP meds 5. Hx DVT/PE 6. Lethargy / myoclonus - resolved  Rec- no change, check creat am.     Kelly Splinter MD  pager [phone]    cell [phone]  04/23/2014, 9:03 AM     Recent Labs Lab 04/21/14 0510 04/22/14 0442 04/23/14 0535  NA 136 135 137  K 3.8 3.9 3.7  CL 102 104 105  CO2 22 22 22   GLUCOSE 120* 119* 89  BUN 21 21 20   CREATININE 3.94* 3.82* 3.88*  CALCIUM 8.4 8.5 8.3*   No results for input(s): AST, ALT, ALKPHOS, BILITOT, PROT, ALBUMIN in the last 168 hours.  Recent Labs Lab 04/21/14 0510 04/22/14 0442 04/23/14 0535  WBC 12.5* 11.1* 9.1  HGB 8.7* 8.6* 8.4*  HCT 26.8* 26.2* 26.4*  MCV 92.4 92.6 93.0  PLT 498* 508* 552*   . acetaminophen  650 mg Oral Q6H  . atorvastatin  40 mg Oral q1800  . bisacodyl  10 mg Rectal Daily  . brimonidine   1 drop Left Eye BID   And  . timolol  1 drop Left Eye BID  . ciprofloxacin  500 mg Oral Q24H  . enoxaparin (LOVENOX) injection  100 mg Subcutaneous Q24H  . latanoprost  1 drop Both Eyes QHS  . pantoprazole  40 mg Oral Q1200  . primidone  50 mg Oral QHS  . sodium chloride  10-40 mL Intracatheter Q12H  . tamsulosin  0.4 mg Oral Daily  . traMADol  50 mg Oral 4 times per day     albuterol, alum & mag hydroxide-simeth, bisacodyl, lip balm, ondansetron **OR** ondansetron (ZOFRAN) IV, sodium chloride

## 2014-04-23 NOTE — Progress Notes (Signed)
ANTICOAGULATION CONSULT NOTE - Initial Consult  Pharmacy Consult for Lovenox Indication: h/o recurrent DVT  Allergies  Allergen Reactions  . Sulfa Antibiotics Swelling    Patient Measurements: Height: 5' 9.5" (176.5 cm) Weight: 223 lb 8.7 oz (101.4 kg) IBW/kg (Calculated) : 67.35   Vital Signs: Temp: 98.5 F (36.9 C) (03/25 0947) Temp Source: Oral (03/25 0947) BP: 116/57 mmHg (03/25 0947) Pulse Rate: 82 (03/25 0947)  Labs:  Recent Labs  04/21/14 0510 04/22/14 0442 04/23/14 0535  HGB 8.7* 8.6* 8.4*  HCT 26.8* 26.2* 26.4*  PLT 498* 508* 552*  CREATININE 3.94* 3.82* 3.88*    Estimated Creatinine Clearance: 18 mL/min (by C-G formula based on Cr of 3.88).   Medical History: Past Medical History  Diagnosis Date  . Glaucoma   . Clotting disorder   . DVT (deep venous thrombosis)     LEFT  . GERD (gastroesophageal reflux disease)   . Hypercholesterolemia   . Peripheral neuropathy   . Essential tremor   . Diverticulosis   . Arthritis     knees and back  . Cancer     Pancreatic  . Hypertension   . Colon polyps   . UTI (lower urinary tract infection)   . Complication of anesthesia     slow to wak up   . Pulmonary embolism     Medications:  Scheduled:  . acetaminophen  650 mg Oral Q6H  . atorvastatin  40 mg Oral q1800  . bisacodyl  10 mg Rectal Daily  . brimonidine  1 drop Left Eye BID   And  . timolol  1 drop Left Eye BID  . ciprofloxacin  500 mg Oral Q24H  . enoxaparin (LOVENOX) injection  100 mg Subcutaneous Q24H  . gabapentin  300 mg Oral QHS  . latanoprost  1 drop Both Eyes QHS  . pantoprazole  40 mg Oral Q1200  . primidone  50 mg Oral QHS  . sodium chloride  10-40 mL Intracatheter Q12H  . tamsulosin  0.4 mg Oral Daily  . traMADol  50 mg Oral 4 times per day   Infusions:   PRN: albuterol, alum & mag hydroxide-simeth, bisacodyl, lip balm, ondansetron **OR** ondansetron (ZOFRAN) IV, sodium chloride  Assessment: 69 y/o F with pancreatic  adenocarcinoma, s/p distal pancreatectomy and splenectomy, partial colectomy 04/08/14.  Prior to admission pt was on full-dose Lovenox (100mg  SQ q12h) for recurrent DVT; this was interrupted for surgery and resumed 3/19.   Also on 3/19 patient developed acute kidney injury with SCr 1.84 and estimated CrCl < 30 mL/min, so attending MD reduced Lovenox to 100mg  SQ q24h. In the interim, the SCr continued rising and for the past four days has been between 3.33 and 3.94 with estimated CrCl < 20 mL/min.   Given the potential for Lovenox accumulation in this setting, spoke with MD today and obtained order for pharmacy to assist with dosing.   Anti-Xa level drawn today at 1100 (six hours post-dose) is 1.15, which is slightly above the optimal range of 0.6 -1.0 but within the laboratory's therapeutic range of 0.5 - 1.2.    The suboptimal draw time (six hours rather than four hours post-dose) is noted.  Nephrologist notes that delay in improvement of SCr could be related to diuretic administered yesterday, and anticipates SCr will improve now that diuretic is being withheld.  Hopefully this will occur, but if not, there is potential for further drug accumulation.   Goal of Therapy:  Full anticoagulation  Adjust Lovenox for  renal function    Plan:  1. For now, continue Lovenox 100 mg SQ q24h.  2. Follow serum creatinine daily.   If not improving, consider 10-20% dosage reduction. 3. Once AKI resolves and estimated CrCl is > 30 mL/min, will plan to increase Lovenox back to patient's usual dosage.  Clayburn Pert, PharmD, BCPS Pager: [phone] 04/23/2014  12:19 PM

## 2014-04-23 NOTE — Progress Notes (Signed)
Referring Physician(s): [facility]  Subjective: Patient states her LUQ/left chest pain has improved today compared to yesterday with inspiration.   Allergies: Sulfa antibiotics  Medications: Prior to Admission medications   Medication Sig Start Date End Date Taking? Authorizing Provider  alum & mag hydroxide-simeth (MAALOX PLUS) 400-400-40 MG/5ML suspension Take 30 mLs by mouth every 6 (six) hours as needed for indigestion.   Yes Historical Provider, MD  atorvastatin (LIPITOR) 40 MG tablet TAKE 1 TABLET BY MOUTH DAILY 03/28/14  Yes Mancel Bale, PA-C  brimonidine-timolol (COMBIGAN) 0.2-0.5 % ophthalmic solution Place 1 drop into the left eye every 12 (twelve) hours.   Yes Historical Provider, MD  CARTIA XT 120 MG 24 hr capsule TAKE ONE CAPSULE BY MOUTH DAILY Patient taking differently: TAKE ONE CAPSULE BY MOUTH DAILY IN THE EVENING 03/28/14  Yes Mancel Bale, PA-C  enoxaparin (LOVENOX) 100 MG/ML injection Inject 1 mL (100 mg total) into the skin every 12 (twelve) hours. 02/16/14  Yes Truitt Merle, MD  gabapentin (NEURONTIN) 300 MG capsule TAKE ONE CAPSULE BY MOUTH AT BEDTIME; AFTER 7 DAYS, MAY INCREASE TO 2 CAPSULES AT BEDTIME 03/28/14  Yes Mancel Bale, PA-C  HYDROcodone-acetaminophen (NORCO/VICODIN) 5-325 MG per tablet Take 1 tablet by mouth every 6 (six) hours as needed for moderate pain. Patient taking differently: Take 2 tablets by mouth every 4 (four) hours as needed for moderate pain.  12/31/13  Yes Wardell Honour, MD  latanoprost (XALATAN) 0.005 % ophthalmic solution Place 1 drop into both eyes at bedtime.   Yes Historical Provider, MD  pantoprazole (PROTONIX) 40 MG tablet TAKE 1 TABLET BY MOUTH DAILY 03/28/14  Yes Leandrew Koyanagi, MD  primidone (MYSOLINE) 50 MG tablet Take 50 mg by mouth daily. Patient takes at nite   Yes Historical Provider, MD  propranolol ER (INDERAL LA) 120 MG 24 hr capsule TAKE ONE CAPSULE BY MOUTH DAILY Patient taking differently: TAKE ONE CAPSULE BY MOUTH DAILY AT  BEDTIME 03/28/14  Yes Mancel Bale, PA-C  acetaminophen (TYLENOL) 500 MG tablet Take 500 mg by mouth every 6 (six) hours as needed (Pain).    Historical Provider, MD  doxycycline (VIBRA-TABS) 100 MG tablet Take 1 tablet (100 mg total) by mouth 2 (two) times daily. 03/19/14   Golden Circle, FNP  traMADol (ULTRAM) 50 MG tablet Take 1 tablet (50 mg total) by mouth every 8 (eight) hours as needed. Patient not taking: Reported on 03/29/2014 03/19/14   Golden Circle, FNP    Vital Signs: BP 116/57 mmHg  Pulse 82  Temp(Src) 98.5 F (36.9 C) (Oral)  Resp 16  Ht 5' 9.5" (1.765 m)  Wt 223 lb 8.7 oz (101.4 kg)  BMI 32.55 kg/m2  SpO2 97%  Physical Exam General: A&Ox3, NAD, sitting up in chair Abd: Soft, NT, ND, LUQ drain intact <5 cc output serosang, surgical LLQ drain intact  Imaging: US Renal  04/20/2014   CLINICAL DATA:  Acute renal failure  EXAM: RENAL/URINARY TRACT ULTRASOUND COMPLETE  COMPARISON:  04/15/2014  FINDINGS: Right Kidney:  Length: 11.7 cm. Echogenicity within normal limits. No mass or hydronephrosis visualized.  Left Kidney:  Length: 12.0 cm. A 2.4 cm cyst is noted in the midportion of the left kidney posteriorly. This corresponds to that seen on recent CT examination.  Bladder:  Decompressed by Foley catheter.  IMPRESSION: Left renal cyst.   Electronically Signed   By: Inez Catalina M.D.   On: 04/20/2014 11:09    Labs:  CBC:  Recent Labs  04/19/14 0630 04/21/14 0510 04/22/14 0442 04/23/14 0535  WBC 15.0* 12.5* 11.1* 9.1  HGB 6.4* 8.7* 8.6* 8.4*  HCT 20.0* 26.8* 26.2* 26.4*  PLT 475* 498* 508* 552*    COAGS:  Recent Labs  04/01/14 1400 04/08/14 0630 04/09/14 0404  INR 1.11 1.06 1.12  APTT  --  40* 33    BMP:  Recent Labs  04/20/14 0535 04/21/14 0510 04/22/14 0442 04/23/14 0535  NA 133* 136 135 137  K 3.8 3.8 3.9 3.7  CL 100 102 104 105  CO2 21 22 22 22   GLUCOSE 101* 120* 119* 89  BUN 18 21 21 20   CALCIUM 8.1* 8.4 8.5 8.3*  CREATININE 3.70*  3.94* 3.82* 3.88*  GFRNONAA 12* 11* 11* 11*  GFRAA 14* 13* 13* 13*    LIVER FUNCTION TESTS:  Recent Labs  05/03/13 1306 05/12/13 1453 01/04/14 1107 02/16/14 1443 04/01/14 1400  BILITOT 0.6  --  0.5 0.42 0.7  AST 26  --  29 24 35  ALT 8  --  10 10 21   ALKPHOS 109  --  80 96 75  PROT 7.2 6.9 6.8 7.2 7.6  ALBUMIN 3.8  --  3.6 3.6 3.9    Assessment and Plan: S/p distal pancreatectomy/splenectomy/partial colectomy 04/08/14 for panc cancer LUQ abscess  S/p perc drain placed 3/18, Cx pseudomonas A., wbc wnl, minimal output < 10cc (per RN output documented wrong in chart and LUQ with minimal output, LLQ surgical drain with larger output) diaphragmatic irritation improved  Would continue to recommend repeat CT without IV contrast soon with LUQ minimal output Plans per [facility] Acute renal failure Nephrology on board    Signed: Hedy Jacob 04/23/2014, 1:01 PM   I spent a total of 15 Minutes in face to face in clinical consultation/evaluation, greater than 50% of which was counseling/coordinating care for LUQ abscess.

## 2014-04-24 ENCOUNTER — Inpatient Hospital Stay (HOSPITAL_COMMUNITY): Payer: Medicare Other

## 2014-04-24 ENCOUNTER — Encounter (HOSPITAL_COMMUNITY): Payer: Self-pay | Admitting: Radiology

## 2014-04-24 LAB — BASIC METABOLIC PANEL
Anion gap: 10 (ref 5–15)
BUN: 23 mg/dL (ref 6–23)
CO2: 22 mmol/L (ref 19–32)
CREATININE: 3.84 mg/dL — AB (ref 0.50–1.10)
Calcium: 8.2 mg/dL — ABNORMAL LOW (ref 8.4–10.5)
Chloride: 103 mmol/L (ref 96–112)
GFR calc Af Amer: 13 mL/min — ABNORMAL LOW (ref >90)
GFR calc non Af Amer: 11 mL/min — ABNORMAL LOW (ref >90)
Glucose, Bld: 96 mg/dL (ref 70–99)
POTASSIUM: 3.7 mmol/L (ref 3.5–5.1)
Sodium: 135 mmol/L (ref 135–145)

## 2014-04-24 LAB — HEPATIC FUNCTION PANEL
ALT: 13 U/L (ref 0–35)
AST: 30 U/L (ref 0–37)
Albumin: 2.4 g/dL — ABNORMAL LOW (ref 3.5–5.2)
Alkaline Phosphatase: 83 U/L (ref 39–117)
Bilirubin, Direct: <0.1 mg/dL (ref 0.0–0.5)
Total Bilirubin: 0.5 mg/dL (ref 0.3–1.2)
Total Protein: 6.1 g/dL (ref 6.0–8.3)

## 2014-04-24 LAB — CBC
HCT: 26.5 % — ABNORMAL LOW (ref 36.0–46.0)
Hemoglobin: 8.6 g/dL — ABNORMAL LOW (ref 12.0–15.0)
MCH: 30.1 pg (ref 26.0–34.0)
MCHC: 32.5 g/dL (ref 30.0–36.0)
MCV: 92.7 fL (ref 78.0–100.0)
Platelets: 549 10*3/uL — ABNORMAL HIGH (ref 150–400)
RBC: 2.86 MIL/uL — ABNORMAL LOW (ref 3.87–5.11)
RDW: 14.6 % (ref 11.5–15.5)
WBC: 9.1 10*3/uL (ref 4.0–10.5)

## 2014-04-24 MED ORDER — ENOXAPARIN SODIUM 100 MG/ML ~~LOC~~ SOLN
90.0000 mg | SUBCUTANEOUS | Status: DC
Start: 2014-04-25 — End: 2014-04-25
  Administered 2014-04-25: 90 mg via SUBCUTANEOUS
  Filled 2014-04-24 (×2): qty 1

## 2014-04-24 MED ORDER — ALBUMIN HUMAN 25 % IV SOLN
25.0000 g | Freq: Four times a day (QID) | INTRAVENOUS | Status: DC
Start: 2014-04-24 — End: 2014-04-25
  Administered 2014-04-24 – 2014-04-25 (×3): 25 g via INTRAVENOUS
  Filled 2014-04-24 (×4): qty 100

## 2014-04-24 MED ORDER — IOHEXOL 300 MG/ML  SOLN: 50.0000 mL | Freq: Once | INTRAMUSCULAR | Status: DC | PRN

## 2014-04-24 NOTE — Progress Notes (Addendum)
   [facility] Progress Note   Subjective: creat unchanged at 3.8, no new complaints, no sob , CP, no n/v/d. Eating ok  Filed Vitals:   04/23/14 2132 04/24/14 0500 04/24/14 0545 04/24/14 0951  BP: 108/55  124/63 124/54  Pulse: 85  91 85  Temp: 98 F (36.7 C)  98.2 F (36.8 C) 98.3 F (36.8 C)  TempSrc: Oral  Oral Oral  Resp: 16  16 18   Height:      Weight:  101.6 kg (223 lb 15.8 oz)    SpO2: 98%  97% 96%   Exam: Alert, slight slurred speech No jvd Chest clear bilat, no rales or wheezing RRR no MRG Abd obese, drain L side, nontender, diminished BS 1+ edema bilat LE's Neuro nonfocal  UA 3/16 1.009, neg protein, 3-6wbc, 0-2 rbc UA 3/22 1.009, 6.0, 30 prot, 0-2 wbc/ rbc per hpf UNa 31, UCr 59 CXR 3/19 dense consolidation L base Wt's pending today I/O since admit > 35L in and 24L out, +11L  Assessment: 1. Acute renal failure -  nonoliguric ATN from contrast / IV vanc. Making urine but creat no better today.  BP's on the soft side.  Check albumin/LFT's,  will start IV albumin. Quite edematous, I'm reluctant to give fluids. Repeat UA, urine lytes. D/C PPI also (can cause AIN, though unlikely).  2. Lethargy/ myoclonus - will stop gabapentin, this becomes toxic with renal failure  3. Vol excess - stable 4. Panc Ca, s/p distal pancreatectomy 5. HTN - holding home BP meds (dilt/ propranolol) 6. Hx DVT/PE  Rec- STOP gabapentin, no narcotics. Need to watch closely off of all sedating meds to differentiate uremia from medication side effects.  Start IV albumin, repeat urine lytes/ UA, check serum alb.     Kelly Splinter MD  pager [phone]    cell [phone]  04/24/2014, 7:59 PM     Recent Labs Lab 04/22/14 0442 04/23/14 0535 04/24/14 0400  NA 135 137 135  K 3.9 3.7 3.7  CL 104 105 103  CO2 22 22 22   GLUCOSE 119* 89 96  BUN 21 20 23   CREATININE 3.82* 3.88* 3.84*  CALCIUM 8.5 8.3* 8.2*   No results for input(s): AST, ALT, ALKPHOS, BILITOT, PROT, ALBUMIN  in the last 168 hours.  Recent Labs Lab 04/22/14 0442 04/23/14 0535 04/24/14 0400  WBC 11.1* 9.1 9.1  HGB 8.6* 8.4* 8.6*  HCT 26.2* 26.4* 26.5*  MCV 92.6 93.0 92.7  PLT 508* 552* 549*   . acetaminophen  650 mg Oral Q6H  . atorvastatin  40 mg Oral q1800  . bisacodyl  10 mg Rectal Daily  . brimonidine  1 drop Left Eye BID   And  . timolol  1 drop Left Eye BID  . ciprofloxacin  500 mg Oral Q24H  . [START ON 04/25/2014] enoxaparin (LOVENOX) injection  90 mg Subcutaneous Q24H  . gabapentin  300 mg Oral QHS  . latanoprost  1 drop Both Eyes QHS  . pantoprazole  40 mg Oral Q1200  . primidone  50 mg Oral QHS  . sodium chloride  10-40 mL Intracatheter Q12H  . tamsulosin  0.4 mg Oral Daily  . traMADol  50 mg Oral 4 times per day     albuterol, alum & mag hydroxide-simeth, bisacodyl, iohexol, lip balm, ondansetron **OR** ondansetron (ZOFRAN) IV, sodium chloride

## 2014-04-24 NOTE — Progress Notes (Signed)
General Surgery Note  LOS: 16 days  POD -  16 Days Post-Op  Assessment/Plan: 1.  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  LUQ subphrenic abscess  Has 2 LUQ drains - one is perc drain from IR - 04/16/2014  Cipro  Per notes from Markus Daft - will plan repeat CT scan of abdomen tomorrow.  3.  Renal dysfunction  Creat - 3.84 - 04/24/2014  Followed by Dr. Jonnie Finner 4.  Urinary retention  Has foley - there are no plans to get this out 5.  Anemia  Hgb - 8.6 - 04/24/2014 6.  DVT prophylaxis - Lovenox 7.  Tremors  On Gabapentin and primidone 8.  Ambulation  She does not do much on her own  PT ordered, but did not come by yesterday.  Has not been by today.   Principal Problem:   Pancreas cancer of tail s/p distal pancreatectomy, splenectomy, & partial colectomy 04/08/2014 Active Problems:   Carcinoma of tail of pancreas   Abscess of abdominal cavity   Subjective:  Doing okay.  She has had some loose stools.  We talked about drains, renal function and foley.  Daughter, Kathryne, in room with patient. Objective:   Filed Vitals:   04/24/14 0951  BP: 124/54  Pulse: 85  Temp: 98.3 F (36.8 C)  Resp: 18     Intake/Output from previous day:  03/25 0701 - 03/26 0700 In: 680 [P.O.:600; I.V.:60] Out: 1017 [Urine:1650; Drains:70]  Intake/Output this shift:  Total I/O In: 240 [P.O.:240] Out: 155 [Urine:150; Drains:5]   Physical Exam:   General: WN older AA F who is alert.   HEENT: Normal. Pupils equal. .   Lungs: Clear.  IS - 1,500 cc   Abdomen: Soft.  Has few BS   Wound: Upper midline wound looks okay    Drains - 1/2 - 60/10 cc last 24 hours      Lab Results:     Recent Labs  04/23/14 0535 04/24/14 0400  WBC 9.1 9.1  HGB 8.4* 8.6*  HCT 26.4* 26.5*  PLT 552* 549*    BMET    Recent Labs   04/23/14 0535 04/24/14 0400  NA 137 135  K 3.7 3.7  CL 105 103  CO2 22 22  GLUCOSE 89 96  BUN 20 23  CREATININE 3.88* 3.84*  CALCIUM 8.3* 8.2*    PT/INR  No results for input(s): LABPROT, INR in the last 72 hours.  ABG  No results for input(s): PHART, HCO3 in the last 72 hours.  Invalid input(s): PCO2, PO2   Studies/Results:  No results found.   Anti-infectives:   Anti-infectives    Start     Dose/Rate Route Frequency Ordered Stop   04/21/14 1000  ciprofloxacin (CIPRO) tablet 500 mg     500 mg Oral Every 24 hours 04/21/14 0849     04/20/14 1400  piperacillin-tazobactam (ZOSYN) IVPB 2.25 g  Status:  Discontinued     2.25 g 100 mL/hr over 30 Minutes Intravenous Every 6 hours 04/20/14 0713 04/21/14 0849   04/15/14 1800  vancomycin (VANCOCIN) 1,250 mg in sodium chloride 0.9 % 250 mL IVPB  Status:  Discontinued     1,250 mg 166.7 mL/hr over 90 Minutes Intravenous Every 12 hours 04/15/14 0801 04/17/14 0643   04/15/14 1500  piperacillin-tazobactam (ZOSYN) IVPB 3.375 g  Status:  Discontinued     3.375 g 12.5 mL/hr over 240 Minutes Intravenous 3 times per day 04/15/14 0801 04/20/14 1316   04/15/14 0900  vancomycin (VANCOCIN) 1,500 mg in sodium chloride 0.9 % 500 mL IVPB     1,500 mg 250 mL/hr over 120 Minutes Intravenous  Once 04/15/14 0801 04/15/14 1231   04/15/14 0830  piperacillin-tazobactam (ZOSYN) IVPB 3.375 g     3.375 g 100 mL/hr over 30 Minutes Intravenous  Once 04/15/14 0801 04/15/14 0928   04/08/14 1600  cefoTEtan (CEFOTAN) 2 g in dextrose 5 % 50 mL IVPB     2 g 100 mL/hr over 30 Minutes Intravenous Every 12 hours 04/08/14 1522 04/09/14 0530   04/08/14 0537  ceFAZolin (ANCEF) IVPB 2 g/50 mL premix     2 g 100 mL/hr over 30 Minutes Intravenous On call to O.R. 04/08/14 0537 04/08/14 0759      Alphonsa Overall, MD, FACS Pager: [facility] Office: [phone] 04/24/2014

## 2014-04-24 NOTE — Progress Notes (Signed)
ANTICOAGULATION CONSULT NOTE - follow up  Pharmacy Consult for Lovenox Indication: h/o recurrent DVT  Allergies  Allergen Reactions  . Sulfa Antibiotics Swelling    Patient Measurements: Height: 5' 9.5" (176.5 cm) Weight: 223 lb 15.8 oz (101.6 kg) IBW/kg (Calculated) : 67.35   Vital Signs: Temp: 98.3 F (36.8 C) (03/26 0951) Temp Source: Oral (03/26 0951) BP: 124/54 mmHg (03/26 0951) Pulse Rate: 85 (03/26 0951)  Labs:  Recent Labs  04/22/14 0442 04/23/14 0535 04/24/14 0400  HGB 8.6* 8.4* 8.6*  HCT 26.2* 26.4* 26.5*  PLT 508* 552* 549*  CREATININE 3.82* 3.88* 3.84*    Estimated Creatinine Clearance: 18.2 mL/min (by C-G formula based on Cr of 3.84).   Medical History: Past Medical History  Diagnosis Date  . Glaucoma   . Clotting disorder   . DVT (deep venous thrombosis)     LEFT  . GERD (gastroesophageal reflux disease)   . Hypercholesterolemia   . Peripheral neuropathy   . Essential tremor   . Diverticulosis   . Arthritis     knees and back  . Cancer     Pancreatic  . Hypertension   . Colon polyps   . UTI (lower urinary tract infection)   . Complication of anesthesia     slow to wak up   . Pulmonary embolism     Medications:  Scheduled:  . acetaminophen  650 mg Oral Q6H  . atorvastatin  40 mg Oral q1800  . bisacodyl  10 mg Rectal Daily  . brimonidine  1 drop Left Eye BID   And  . timolol  1 drop Left Eye BID  . ciprofloxacin  500 mg Oral Q24H  . enoxaparin (LOVENOX) injection  100 mg Subcutaneous Q24H  . gabapentin  300 mg Oral QHS  . latanoprost  1 drop Both Eyes QHS  . pantoprazole  40 mg Oral Q1200  . primidone  50 mg Oral QHS  . sodium chloride  10-40 mL Intracatheter Q12H  . tamsulosin  0.4 mg Oral Daily  . traMADol  50 mg Oral 4 times per day   Infusions:   PRN: albuterol, alum & mag hydroxide-simeth, bisacodyl, lip balm, ondansetron **OR** ondansetron (ZOFRAN) IV, sodium chloride  Assessment: 69 y/o F with pancreatic  adenocarcinoma, s/p distal pancreatectomy and splenectomy, partial colectomy 04/08/14.  Prior to admission pt was on full-dose Lovenox (100mg  SQ q12h) for recurrent DVT; this was interrupted for surgery and resumed 3/19.   Also on 3/19 patient developed acute kidney injury with SCr 1.84 and estimated CrCl < 30 mL/min, so attending MD reduced Lovenox to 100mg  SQ q24h. In the interim, the SCr continued rising and for the past four days has been between 3.33 and 3.94 with estimated CrCl < 20 mL/min.   Given the potential for Lovenox accumulation in this setting, an order was obtained for pharmacy to assist with dosing.  Significant Events:  Anti-Xa level drawn 3/25 at 1100 (six hours post-dose) = 1.15, which is slightly above the optimal range of 0.6 -1.0 but within the laboratory's therapeutic range of 0.5 - 1.2.    The suboptimal draw time (six hours rather than four hours post-dose) is noted.  Nephrologist notes on 3/25 that delay in improvement of SCr could be related to diuretic administered on 3/24, and anticipates SCr will improve now that diuretic is being withheld.  Hopefully this will occur, but if not, there is potential for further drug accumulation.  3/26: Scr 3.84   Goal of Therapy:  Full anticoagulation  Adjust Lovenox for renal function    Plan:  -decrease lovenox dose by 10%, lovenox 90 mg SQ q24h -Follow serum creatinine daily. If not improving, consider another 10% dosage reduction - Once AKI resolves and estimated CrCl is > 30 mL/min, will plan to increase Lovenox back to patient's usual dosage.  Dolly Rias RPh 04/24/2014, 1:33 PM Pager [phone]

## 2014-04-25 LAB — CBC
HCT: 24.7 % — ABNORMAL LOW (ref 36.0–46.0)
HEMOGLOBIN: 7.9 g/dL — AB (ref 12.0–15.0)
MCH: 29.8 pg (ref 26.0–34.0)
MCHC: 32 g/dL (ref 30.0–36.0)
MCV: 93.2 fL (ref 78.0–100.0)
Platelets: 537 10*3/uL — ABNORMAL HIGH (ref 150–400)
RBC: 2.65 MIL/uL — ABNORMAL LOW (ref 3.87–5.11)
RDW: 14.7 % (ref 11.5–15.5)
WBC: 9.4 10*3/uL (ref 4.0–10.5)

## 2014-04-25 LAB — CREATININE, URINE, RANDOM: Creatinine, Urine: 88.01 mg/dL

## 2014-04-25 LAB — RENAL FUNCTION PANEL
ANION GAP: 8 (ref 5–15)
Albumin: 2.6 g/dL — ABNORMAL LOW (ref 3.5–5.2)
BUN: 24 mg/dL — AB (ref 6–23)
CALCIUM: 8.3 mg/dL — AB (ref 8.4–10.5)
CO2: 22 mmol/L (ref 19–32)
Chloride: 108 mmol/L (ref 96–112)
Creatinine, Ser: 3.7 mg/dL — ABNORMAL HIGH (ref 0.50–1.10)
GFR calc Af Amer: 14 mL/min — ABNORMAL LOW (ref >90)
GFR calc non Af Amer: 12 mL/min — ABNORMAL LOW (ref >90)
Glucose, Bld: 84 mg/dL (ref 70–99)
Phosphorus: 4.5 mg/dL (ref 2.3–4.6)
Potassium: 3.6 mmol/L (ref 3.5–5.1)
Sodium: 138 mmol/L (ref 135–145)

## 2014-04-25 LAB — URINE MICROSCOPIC-ADD ON

## 2014-04-25 LAB — URINALYSIS, ROUTINE W REFLEX MICROSCOPIC
Bilirubin Urine: NEGATIVE
GLUCOSE, UA: NEGATIVE mg/dL
HGB URINE DIPSTICK: NEGATIVE
Ketones, ur: NEGATIVE mg/dL
Nitrite: NEGATIVE
Protein, ur: NEGATIVE mg/dL
SPECIFIC GRAVITY, URINE: 1.01 (ref 1.005–1.030)
Urobilinogen, UA: 0.2 mg/dL (ref 0.0–1.0)
pH: 5.5 (ref 5.0–8.0)

## 2014-04-25 LAB — SODIUM, URINE, RANDOM: Sodium, Ur: 48 mmol/L

## 2014-04-25 LAB — CLOSTRIDIUM DIFFICILE BY PCR: Toxigenic C. Difficile by PCR: NEGATIVE

## 2014-04-25 MED ORDER — ALBUMIN HUMAN 25 % IV SOLN
25.0000 g | Freq: Three times a day (TID) | INTRAVENOUS | Status: DC
  Administered 2014-04-25 – 2014-04-26 (×2): 25 g via INTRAVENOUS
  Filled 2014-04-25 (×5): qty 100

## 2014-04-25 MED ORDER — SODIUM CHLORIDE 0.9 % IV SOLN
INTRAVENOUS | Status: DC
  Administered 2014-04-25: 50 mL/h via INTRAVENOUS

## 2014-04-25 MED ORDER — ENOXAPARIN SODIUM 80 MG/0.8ML ~~LOC~~ SOLN
80.0000 mg | SUBCUTANEOUS | Status: DC
  Administered 2014-04-26 – 2014-05-01 (×6): 80 mg via SUBCUTANEOUS
  Filled 2014-04-25 (×7): qty 0.8

## 2014-04-25 NOTE — Progress Notes (Signed)
  [facility] Progress Note   Subjective: BP up w albumin, creat down slightly 3.70, UOP good.  Repeat UA 7-10 WBC, rare bact, few epis, hyaline casts. UNa 48, Ucreat 88  Filed Vitals:   04/25/14 0604 04/25/14 0815 04/25/14 1219 04/25/14 1355  BP:  124/60 142/66 145/69  Pulse:  88 87   Temp:  99 F (37.2 C) 98.4 F (36.9 C) 98.3 F (36.8 C)  TempSrc:  Oral Oral   Resp:  18 18 18   Height:      Weight: 102.9 kg (226 lb 13.7 oz)     SpO2:  98% 98% 99%   Exam: Alert, slight slurred speech No jvd Chest clear bilat, no rales or wheezing RRR no MRG Abd obese, drain L side, nontender, diminished BS 1-2+ edema bilat LE's Neuro nonfocal  UA 3/16 1.009, neg protein, 3-6wbc, 0-2 rbc UA 3/22 1.009, 6.0, 30 prot, 0-2 wbc/ rbc per hpf CXR 3/19 dense consolidation L base Wt's pending today I/O since admit > 35L in and 24L out, +11L  Assessment: 1. Acute renal failure -  nonoliguric ATN from contrast / IV vanc. Stalled recovery prob due to 3rd spacing and soft BP's, hypoperfusion.  BP's better w albumin and creat down today. If not improving consider AIN from meds (cipro). Start back IVF 50/hr as tolerated. 2. Lethargy/ myoclonus - avoiding neurontin/ narcotics, on tramadol for pain  3. Vol excess - stable, +LE edema, resp stable 4. Panc Ca, s/p distal pancreatectomy 5. HTN - holding home BP meds (dilt/ propranolol) 6. Hx DVT/PE  Rec- cont IV albumin, check labs in am. Start NS at 50/hr.      Kelly Splinter MD  pager [phone]    cell [phone]  04/25/2014, 5:17 PM     Recent Labs Lab 04/23/14 0535 04/24/14 0400 04/25/14 0436  NA 137 135 138  K 3.7 3.7 3.6  CL 105 103 108  CO2 22 22 22   GLUCOSE 89 96 84  BUN 20 23 24*  CREATININE 3.88* 3.84* 3.70*  CALCIUM 8.3* 8.2* 8.3*  PHOS  --   --  4.5    Recent Labs Lab 04/24/14 2039 04/25/14 0436  AST 30  --   ALT 13  --   ALKPHOS 83  --   BILITOT 0.5  --   PROT 6.1  --   ALBUMIN 2.4* 2.6*    Recent  Labs Lab 04/23/14 0535 04/24/14 0400 04/25/14 0436  WBC 9.1 9.1 9.4  HGB 8.4* 8.6* 7.9*  HCT 26.4* 26.5* 24.7*  MCV 93.0 92.7 93.2  PLT 552* 549* 537*   . acetaminophen  650 mg Oral Q6H  . albumin human  25 g Intravenous 4 times per day  . atorvastatin  40 mg Oral q1800  . bisacodyl  10 mg Rectal Daily  . brimonidine  1 drop Left Eye BID   And  . timolol  1 drop Left Eye BID  . ciprofloxacin  500 mg Oral Q24H  . [START ON 04/26/2014] enoxaparin (LOVENOX) injection  80 mg Subcutaneous Q24H  . latanoprost  1 drop Both Eyes QHS  . primidone  50 mg Oral QHS  . sodium chloride  10-40 mL Intracatheter Q12H  . tamsulosin  0.4 mg Oral Daily  . traMADol  50 mg Oral 4 times per day     albuterol, alum & mag hydroxide-simeth, bisacodyl, lip balm, ondansetron **OR** ondansetron (ZOFRAN) IV, sodium chloride

## 2014-04-25 NOTE — Progress Notes (Signed)
General Surgery Note  LOS: 17 days  POD -  17 Days Post-Op  Assessment/Plan: 1.  ROBOTIC ASSISTED CONVERTED LAPAROSCOPIC HAND ASSITED DISTAL PANCREATECTOMY/SPLENECTOMY WITH PARTIAL COLECTOMY - 04/08/2014 - [place]  For carcinoma at the tail of the pancreas  On reg diet  Some loose stools - C. Diff neg on 04/24/2014  Poor motivation  2.  LUQ subphrenic abscess  Has 2 LUQ drains - one is perc drain from IR - 04/16/2014  Cipro  CT scan on 07/25/2014 shows that the abscess/collection in LUQ is better - will discuss with Dr. Marcello Moores tomorrow, but probably get the perc drain out.  3.  Renal dysfunction  Creat - 3.7 - 04/25/2014  Followed by Dr. Jonnie Finner - appreciate his help.  Given IV Albumin. 4.  Urinary retention  Has foley - there are no plans to get this out 5.  Anemia  Hgb - 7.9 - 04/25/2014 6.  DVT prophylaxis - Lovenox 7.  Tremors  On primidone  Gabapentin held because of possible toxicity in renal failure 8.  Ambulation  She does not do much on her own  PT has not seen her over the weekend.  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  It sounds like she is having 3 to 4 loose stools per day.  Her appetite is poor.  She is not very motivated.  In room by self. Objective:   Filed Vitals:   04/25/14 0558  BP: 141/66  Pulse: 85  Temp: 99.4 F (37.4 C)  Resp: 18     Intake/Output from previous day:  03/26 0701 - 03/27 0700 In: 350 [P.O.:240; IV Piggyback:100] Out: 860 [Urine:800; Drains:60]  Intake/Output this shift:  Total I/O In: 120 [P.O.:120] Out: 400 [Urine:400]   Physical Exam:   General: WN older AA F who is alert.   HEENT: Normal. Pupils equal. .   Lungs: Clear.  IS - 1,500 cc   Abdomen: Soft.  Has few  BS   Wound: Upper midline wound looks okay    Drains - 1/2 - 50/10 cc last 24 hours (note: perc drain wound is getting irrigated 10 cc per shift)      Lab Results:     Recent Labs  04/24/14 0400 04/25/14 0436  WBC 9.1 9.4  HGB 8.6* 7.9*  HCT 26.5* 24.7*  PLT 549* 537*    BMET    Recent Labs  04/24/14 0400 04/25/14 0436  NA 135 138  K 3.7 3.6  CL 103 108  CO2 22 22  GLUCOSE 96 84  BUN 23 24*  CREATININE 3.84* 3.70*  CALCIUM 8.2* 8.3*    PT/INR  No results for input(s): LABPROT, INR in the last 72 hours.  ABG  No results for input(s): PHART, HCO3 in the last 72 hours.  Invalid input(s): PCO2, PO2   Studies/Results:  Ct Abdomen Pelvis Wo Contrast  04/24/2014   CLINICAL DATA:  Status post splenectomy and distal pancreatectomy, post drainage of intra-abdominal abscess  EXAM: CT ABDOMEN AND PELVIS WITHOUT CONTRAST  TECHNIQUE: Multidetector CT imaging of the abdomen and pelvis was performed following the standard protocol without IV contrast.  COMPARISON:  04/15/2014 and 04/16/2014  FINDINGS: Sagittal images of the spine shows diffuse osteopenia. Mild degenerative changes  lumbar spine. There is moderate left pleural effusion with left lower lobe atelectasis.  Again noted status post cholecystectomy. Unenhanced liver is unremarkable. The patient is status post splenectomy and distal pancreatectomy. A percutaneous drainage catheter is noted in left flank with tip in left upper quadrant anterior to upper pole of the left kidney. There is significant improvement from prior exam. The previous subdiaphragmatic fluid has resolved. The collection anterior to left kidney has resolved. Stable postsurgical changes at the site of distal pancreatectomy. Small residual seroma at this level measures 2.8 cm stable in size in appearance from prior exam. A cyst in midpole posterior aspect of the left kidney is stable. Postsurgical drain in left lower abdomen is stable. No new low abscess is noted in  left abdomen. Only tiny amount of residual fluid/air is noted just anterior to the surgical drain in left upper abdomen axial image 14 measures about 2.4 cm . IVC filter in place again noted. There is no small bowel obstruction. No ascites or free air. Normal appendix partially visualized. Mild anasarca infiltration subcutaneous fat bilateral flank wall. Again noted subcutaneous round density consistent with subcutaneous injections lower anterior abdominal wall.  No nephrolithiasis. No hydronephrosis or hydroureter. No distal colonic obstruction. There is a Foley catheter within a decompressed urinary bladder. Air is noted within anterior aspect of the bladder.  IMPRESSION: 1. The patient is status post splenectomy and distal pancreatectomy. A percutaneous drainage catheter is noted in left flank with tip in left upper quadrant anterior to upper pole of the left kidney. There is significant improvement from prior exam. The previous subdiaphragmatic fluid has resolved. The collection anterior to left kidney has resolved. 2. There is moderate left pleural effusion with left lower lobe atelectasis. 3. No new abscess collection is noted. Only tiny amount of residual fluid/air is noted just anterior to the surgical drain in left upper abdomen axial image 14 measures about 2.4 cm 4. Stable postsurgical changes at the site of distal pancreatectomy. 5. IVC filter in place again noted. 6. No small bowel or colonic obstruction. 7. There is Foley catheter in decompressed urinary bladder. Moderate air is noted anterior aspect of the bladder probable post instrumentation.   Electronically Signed   By: Lahoma Crocker M.D.   On: 04/24/2014 18:43     Anti-infectives:   Anti-infectives    Start     Dose/Rate Route Frequency Ordered Stop   04/21/14 1000  ciprofloxacin (CIPRO) tablet 500 mg     500 mg Oral Every 24 hours 04/21/14 0849     04/20/14 1400  piperacillin-tazobactam (ZOSYN) IVPB 2.25 g  Status:  Discontinued     2.25  g 100 mL/hr over 30 Minutes Intravenous Every 6 hours 04/20/14 0713 04/21/14 0849   04/15/14 1800  vancomycin (VANCOCIN) 1,250 mg in sodium chloride 0.9 % 250 mL IVPB  Status:  Discontinued     1,250 mg 166.7 mL/hr over 90 Minutes Intravenous Every 12 hours 04/15/14 0801 04/17/14 0643   04/15/14 1500  piperacillin-tazobactam (ZOSYN) IVPB 3.375 g  Status:  Discontinued     3.375 g 12.5 mL/hr over 240 Minutes Intravenous 3 times per day 04/15/14 0801 04/20/14 1316   04/15/14 0900  vancomycin (VANCOCIN) 1,500 mg in sodium chloride 0.9 % 500 mL IVPB     1,500 mg 250 mL/hr over 120 Minutes Intravenous  Once 04/15/14 0801 04/15/14 1231   04/15/14 0830  piperacillin-tazobactam (ZOSYN) IVPB 3.375 g     3.375 g 100 mL/hr over 30 Minutes  Intravenous  Once 04/15/14 0801 04/15/14 0928   04/08/14 1600  cefoTEtan (CEFOTAN) 2 g in dextrose 5 % 50 mL IVPB     2 g 100 mL/hr over 30 Minutes Intravenous Every 12 hours 04/08/14 1522 04/09/14 0530   04/08/14 0537  ceFAZolin (ANCEF) IVPB 2 g/50 mL premix     2 g 100 mL/hr over 30 Minutes Intravenous On call to O.R. 04/08/14 0537 04/08/14 0759      Alphonsa Overall, MD, FACS Pager: [facility] Office: [phone] 04/25/2014

## 2014-04-25 NOTE — Progress Notes (Signed)
ANTICOAGULATION CONSULT NOTE - follow up  Pharmacy Consult for Lovenox Indication: h/o recurrent DVT  Allergies  Allergen Reactions  . Sulfa Antibiotics Swelling    Patient Measurements: Height: 5' 9.5" (176.5 cm) Weight: 226 lb 13.7 oz (102.9 kg) IBW/kg (Calculated) : 67.35   Vital Signs: Temp: 98.4 F (36.9 C) (03/27 1219) Temp Source: Oral (03/27 1219) BP: 142/66 mmHg (03/27 1219) Pulse Rate: 87 (03/27 1219)  Labs:  Recent Labs  04/23/14 0535 04/24/14 0400 04/25/14 0436  HGB 8.4* 8.6* 7.9*  HCT 26.4* 26.5* 24.7*  PLT 552* 549* 537*  CREATININE 3.88* 3.84* 3.70*    Estimated Creatinine Clearance: 19 mL/min (by C-G formula based on Cr of 3.7).   Medical History: Past Medical History  Diagnosis Date  . Glaucoma   . Clotting disorder   . DVT (deep venous thrombosis)     LEFT  . GERD (gastroesophageal reflux disease)   . Hypercholesterolemia   . Peripheral neuropathy   . Essential tremor   . Diverticulosis   . Arthritis     knees and back  . Cancer     Pancreatic  . Hypertension   . Colon polyps   . UTI (lower urinary tract infection)   . Complication of anesthesia     slow to wak up   . Pulmonary embolism     Medications:  Scheduled:  . acetaminophen  650 mg Oral Q6H  . albumin human  25 g Intravenous 4 times per day  . atorvastatin  40 mg Oral q1800  . bisacodyl  10 mg Rectal Daily  . brimonidine  1 drop Left Eye BID   And  . timolol  1 drop Left Eye BID  . ciprofloxacin  500 mg Oral Q24H  . enoxaparin (LOVENOX) injection  90 mg Subcutaneous Q24H  . latanoprost  1 drop Both Eyes QHS  . primidone  50 mg Oral QHS  . sodium chloride  10-40 mL Intracatheter Q12H  . tamsulosin  0.4 mg Oral Daily  . traMADol  50 mg Oral 4 times per day   Infusions:   PRN: albuterol, alum & mag hydroxide-simeth, bisacodyl, lip balm, ondansetron **OR** ondansetron (ZOFRAN) IV, sodium chloride  Assessment: 69 y/o F with pancreatic adenocarcinoma, s/p distal  pancreatectomy and splenectomy, partial colectomy 04/08/14.  Prior to admission pt was on full-dose Lovenox (100mg  SQ q12h) for recurrent DVT; this was interrupted for surgery and resumed 3/19.   Also on 3/19 patient developed acute kidney injury with SCr 1.84 and estimated CrCl < 30 mL/min, so attending MD reduced Lovenox to 100mg  SQ q24h. In the interim, the SCr continued rising and for the past four days has been between 3.33 and 3.94 with estimated CrCl < 20 mL/min.   Given the potential for Lovenox accumulation in this setting, an order was obtained for pharmacy to assist with dosing.  Significant Events:  Anti-Xa level drawn 3/25 at 1100 (six hours post-dose) = 1.15, which is slightly above the optimal range of 0.6 -1.0 but within the laboratory's therapeutic range of 0.5 - 1.2.    The suboptimal draw time (six hours rather than four hours post-dose) is noted.  See Nephrologist notes on 3/26, albumin x 4 given, gabapentin and protonix stopped, hold narcotics  3/27: Scr 3.7   Goal of Therapy:  Full anticoagulation  Adjust Lovenox for renal function   Plan:  -decrease lovenox to 80 mg SQ q24h -Follow serum creatinine daily.  - Once AKI resolves and estimated CrCl is >  30 mL/min, will plan to increase Lovenox back to patient's usual dosage.  Dolly Rias RPh 04/25/2014, 12:29 PM Pager [phone]

## 2014-04-26 LAB — RENAL FUNCTION PANEL
ALBUMIN: 3.3 g/dL — AB (ref 3.5–5.2)
Anion gap: 9 (ref 5–15)
BUN: 22 mg/dL (ref 6–23)
CHLORIDE: 109 mmol/L (ref 96–112)
CO2: 20 mmol/L (ref 19–32)
CREATININE: 3.61 mg/dL — AB (ref 0.50–1.10)
Calcium: 8.4 mg/dL (ref 8.4–10.5)
GFR calc Af Amer: 14 mL/min — ABNORMAL LOW (ref >90)
GFR calc non Af Amer: 12 mL/min — ABNORMAL LOW (ref >90)
Glucose, Bld: 81 mg/dL (ref 70–99)
Phosphorus: 5 mg/dL — ABNORMAL HIGH (ref 2.3–4.6)
Potassium: 3.5 mmol/L (ref 3.5–5.1)
Sodium: 138 mmol/L (ref 135–145)

## 2014-04-26 MED ORDER — CIPROFLOXACIN IN D5W 400 MG/200ML IV SOLN
400.0000 mg | INTRAVENOUS | Status: DC
  Administered 2014-04-27 – 2014-04-30 (×4): 400 mg via INTRAVENOUS
  Filled 2014-04-26 (×4): qty 200

## 2014-04-26 MED ORDER — DARBEPOETIN ALFA 100 MCG/0.5ML IJ SOSY
100.0000 ug | PREFILLED_SYRINGE | INTRAMUSCULAR | Status: DC
  Administered 2014-04-27: 100 ug via SUBCUTANEOUS
  Filled 2014-04-26 (×3): qty 0.5

## 2014-04-26 NOTE — Progress Notes (Signed)
[place] NOTE  Pharmacy Consult for Lovenox Indication: h/o recurrent DVT  Pharmacy Consult for IV Cipro Indication:  P.aeruginosa intra-abdominal abscess  Allergies  Allergen Reactions  . Sulfa Antibiotics Swelling    Patient Measurements: Height: 5' 9.5" (176.5 cm) Weight: 223 lb 15.8 oz (101.6 kg) IBW/kg (Calculated) : 67.35   Vital Signs: Temp: 99.1 F (37.3 C) (03/28 0745) Temp Source: Oral (03/28 0745) BP: 145/70 mmHg (03/28 0745) Pulse Rate: 102 (03/28 0745)  Labs:  Recent Labs  04/24/14 0400 04/25/14 0436 04/26/14 0321  HGB 8.6* 7.9*  --   HCT 26.5* 24.7*  --   PLT 549* 537*  --   CREATININE 3.84* 3.70* 3.61*    Estimated Creatinine Clearance: 19.4 mL/min (by C-G formula based on Cr of 3.61).   Medical History: Past Medical History  Diagnosis Date  . Glaucoma   . Clotting disorder   . DVT (deep venous thrombosis)     LEFT  . GERD (gastroesophageal reflux disease)   . Hypercholesterolemia   . Peripheral neuropathy   . Essential tremor   . Diverticulosis   . Arthritis     knees and back  . Cancer     Pancreatic  . Hypertension   . Colon polyps   . UTI (lower urinary tract infection)   . Complication of anesthesia     slow to wak up   . Pulmonary embolism     Medications:  Scheduled:  . acetaminophen  650 mg Oral Q6H  . albumin human  25 g Intravenous 3 times per day  . atorvastatin  40 mg Oral q1800  . brimonidine  1 drop Left Eye BID   And  . timolol  1 drop Left Eye BID  . [START ON 04/27/2014] ciprofloxacin  400 mg Intravenous Q24H  . enoxaparin (LOVENOX) injection  80 mg Subcutaneous Q24H  . latanoprost  1 drop Both Eyes QHS  . primidone  50 mg Oral QHS  . sodium chloride  10-40 mL Intracatheter Q12H  . tamsulosin  0.4 mg Oral Daily  . traMADol  50 mg Oral 4 times per day   Infusions:  . sodium chloride 50 mL/hr (04/25/14 1741)   PRN: albuterol, alum & mag hydroxide-simeth, lip balm,  ondansetron **OR** ondansetron (ZOFRAN) IV, sodium chloride  Assessment: 69 y/o F with pancreatic adenocarcinoma, s/p distal pancreatectomy and splenectomy, partial colectomy 04/08/14.    Anticoagulation:  Prior to admission pt was on full-dose Lovenox (100mg  SQ q12h) for recurrent DVT; this was interrupted for surgery and resumed 3/19.    Also on 3/19 patient developed acute kidney injury with SCr 1.84 and estimated CrCl < 30 mL/min, so attending MD reduced Lovenox to 100mg  SQ q24h.  In the interim, the SCr continued rising and peaked at 3.94 on 3/23 with estimated CrCl < 20 mL/min.    Given the potential for Lovenox accumulation in this setting, an order was obtained for pharmacy to assist with dosing.  Anti-Xa level drawn 3/25 at 1100 (six hours post-dose) 1.15, which was within the laboratory's therapeutic range of 0.5 - 1.2 but slightly above the optimal range of 0.6 -1.0.  Suboptimal draw time (six hours rather than four hours post-dose) was noted.  Dosage was initially left unchanged, but because of persistent elevation in SCr with potential for further drug accumulation, on 3/27 dosage was reduced to 90 mg SQ q24h x 1 day, then 80 mg SQ q24h.  Antibiotic Dosing:  Post-operatively pt developed intra-abdominal abscess.  Was placed on empiric Zosyn / vancomycin 3/17.     On 3/23 the regimen was de-escalated to oral Cipro monotherapy after abscess culture grew P.aeruginosa.     Of note, patient developed AKI on 3/19 and vancomycin was held 3/19-3/23 due to supratherapeutic serum levels.     Today, 04/26/2014:  Anticoagulation: Lovenox 80 mg SQ q24h SCr 3.61, slowly improving.  Estimated CrCl ~ 20 mL/min UOP ~ 0.7 mL/kg/hr Hgb low, Pltc elevated yesterday.   No bleeding reported in chart note.  Goal of Therapy:  Full anticoagulation  Adjust Lovenox for renal function Anti-Xa level 0.6-1.0 unit/mL   Plan:  1. Continue Lovenox 80 mg SQ q24h 2. Recheck anti-Xa level tomorrow  at 10 AM (four hours post-dose) 3. Follow renal function; anticipate eventual need to return to q12h dosing.   Antibiotic Dosing:  Anti-infectives: 3/17 >> Vancomycin >> 3/23 3/17 >> Zosyn >> 3/23 3/23>>  Cipro PO [MD} >>3/28 3/28>>  Cipro IV [Rx]>>  Microbiology Data: 3/18: intra-abdominal abscess:  Pseudomonas aeruginosa, S to all anti-pseudomonal drugs tested 3/27: Stool C.Diff PCR: negative  D#12 antibiotics, D#6 Cipro 500 mg  PO q24h Tm 99.4 WBC WNL since 3/25 SCr, UOP, and estimated CrCl as documented above Pt with N/V/D.   Orders received from surgery to change Cipro to IV.   Goal of Therapy: Appropriate antibiotic dosing for indication and renal function; eradication of infection.  Plan: 1. Change Cipro to 400 mg IV q24h  2. Follow renal function, clinical course.  Clayburn Pert, PharmD, BCPS Pager: [phone] 04/26/2014  11:15 AM

## 2014-04-26 NOTE — Progress Notes (Signed)
Physical Therapy Treatment Patient Details Name: LIBRADA CASTRONOVO MRN: [id number] DOB: July 15, 1945 Today's Date: 04/26/2014    History of Present Illness 69 yo female s/p lap pancreatectomy, splenectomy, partial colectomy 04/08/14. hx of pancreatic cancer, glaucoma, DVT, peripheral neuropathy    PT Comments    Pt OOB in recliner with daughter in room.  Pt required MAX encouragement to participate and daughter assisted with following with recliner.  ABD pain increased with activity in waves.    Follow Up Recommendations  SNF;Supervision/Assistance - 24 hour     Equipment Recommendations  None recommended by PT    Recommendations for Other Services       Precautions / Restrictions Precautions Precautions: Fall Precaution Comments: abdominal surgery/2 L drains Restrictions Weight Bearing Restrictions: No    Mobility  Bed Mobility               General bed mobility comments: pt oob in recliner  Transfers Overall transfer level: Needs assistance Equipment used: 4-wheeled walker Transfers: Sit to/from Stand Sit to Stand: Min assist         General transfer comment: Assist to rise, stabilize, control descent. Multimodal cues for safety, technique, hand placement.   Ambulation/Gait Ambulation/Gait assistance: +2 safety/equipment (recliner to follow for safety) Ambulation Distance (Feet): 65 Feet Assistive device: 4-wheeled walker Gait Pattern/deviations: Step-through pattern;Trunk flexed Gait velocity: decreased   General Gait Details: pt self limiting.  Daughter present and assisted with motivation.  + 2 such that recliner was following   Financial [employer] (Stroke Patients Only)       Balance                                    Cognition Arousal/Alertness: Awake/alert Behavior During Therapy: Flat affect Overall Cognitive Status: Within Functional Limits for tasks assessed                       Exercises      General Comments        Pertinent Vitals/Pain Pain Assessment: 0-10 Pain Score: 6  Pain Location: ABD Pain Descriptors / Indicators: Cramping;Sore Pain Intervention(s): Monitored during session    Home Living                      Prior Function            PT Goals (current goals can now be found in the care plan section) Progress towards PT goals: Progressing toward goals    Frequency  Min 3X/week    PT Plan      Co-evaluation             End of Session Equipment Utilized During Treatment: Gait belt Activity Tolerance: Patient limited by fatigue Patient left: in chair;with call bell/phone within reach;with family/visitor present     Time: 1435-1450 PT Time Calculation (min) (ACUTE ONLY): 15 min  Charges:  $Gait Training: 8-22 mins                    G Codes:      Rica Koyanagi  PTA [facility]  Acute  Rehab Pager      [phone]

## 2014-04-26 NOTE — Progress Notes (Signed)
Patient ID: [name], female   DOB: Oct 27, 1945, 69 y.o.   MRN: [id number]     [address] [place], [place] [address]    Phone: [phone] FAX: [phone]     Subjective: Pt reports diarrhea, nausea and vomiting.  Afebrile.  VSS.  Negative c diff.  Continued abdominal pain.  Has not mobilized today.  Walked yesterday.  sCr 3.7--->3.6 today. UOP 1650.  Objective:  Vital signs:  Filed Vitals:   04/26/14 0030 04/26/14 0500 04/26/14 0510 04/26/14 0745  BP: 138/67  144/74 145/70  Pulse: 94  98 102  Temp: 98.5 F (36.9 C)  98.3 F (36.8 C) 99.1 F (37.3 C)  TempSrc: Oral  Oral Oral  Resp: 18  18 17   Height:      Weight:  101.6 kg (223 lb 15.8 oz)    SpO2: 97%  100% 95%    Last BM Date: 04/25/14  Intake/Output   Yesterday:  03/27 0701 - 03/28 0700 In: 565.8 [P.O.:240; I.V.:215.8; IV Piggyback:100] Out: 1710 [Urine:1650; Drains:60] This shift: I/O last 3 completed shifts: In: 665.8 [P.O.:240; I.V.:215.8; Other:10; IV Piggyback:200] Out: 2360 [Urine:2300; Drains:60]     Physical Exam: General: Pt awake/alert/oriented x4 in no acute distress Chest: clear. No chest wall pain w good excursion CV:  Pulses intact.  Regular rhythm MS: Normal AROM mjr joints.  No obvious deformity Abdomen: Soft.  Nondistended.   Mildly tender at incisions only.  Drain #1 light brownish DC.  Drain #2--green tinged output. Incisions are c/d/iNo evidence of peritonitis.  No incarcerated hernias. Ext:  SCDs BLE.  No mjr edema.  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 Active Problems:   Carcinoma of tail of pancreas   Abscess of abdominal cavity    Results:   Labs: Results for orders placed or performed during the hospital encounter of 04/08/14 (from the past 48 hour(s))  Hepatic  function panel     Status: Abnormal   Collection Time: 04/24/14  8:39 PM  Result Value Ref Range   Total Protein 6.1 6.0 - 8.3 g/dL   Albumin 2.4 (L) 3.5 - 5.2 g/dL   AST 30 0 - 37 U/L   ALT 13 0 - 35 U/L   Alkaline Phosphatase 83 39 - 117 U/L   Total Bilirubin 0.5 0.3 - 1.2 mg/dL   Bilirubin, Direct <0.1 0.0 - 0.5 mg/dL   Indirect Bilirubin NOT CALCULATED 0.3 - 0.9 mg/dL  Urinalysis, Routine w reflex microscopic     Status: Abnormal   Collection Time: 04/25/14 12:05 AM  Result Value Ref Range   Color, Urine YELLOW YELLOW   APPearance CLEAR CLEAR   Specific Gravity, Urine 1.010 1.005 - 1.030   pH 5.5 5.0 - 8.0   Glucose, UA NEGATIVE NEGATIVE mg/dL   Hgb urine dipstick NEGATIVE NEGATIVE   Bilirubin Urine NEGATIVE NEGATIVE   Ketones, ur NEGATIVE NEGATIVE mg/dL   Protein, ur NEGATIVE NEGATIVE mg/dL   Urobilinogen, UA 0.2 0.0 - 1.0 mg/dL   Nitrite NEGATIVE NEGATIVE   Leukocytes, UA TRACE (A) NEGATIVE  Sodium, urine, random     Status: None   Collection Time: 04/25/14 12:05 AM  Result Value Ref Range   Sodium, Ur 48 mmol/L    Comment: Performed at [facility]  Creatinine, urine, random  Status: None   Collection Time: 04/25/14 12:05 AM  Result Value Ref Range   Creatinine, Urine 88.01 mg/dL    Comment: Performed at [facility]  Urine microscopic-add on     Status: Abnormal   Collection Time: 04/25/14 12:05 AM  Result Value Ref Range   Squamous Epithelial / LPF FEW (A) RARE   WBC, UA 7-10 <3 WBC/hpf   Bacteria, UA RARE RARE   Casts HYALINE CASTS (A) NEGATIVE  Clostridium Difficile by PCR     Status: None   Collection Time: 04/25/14  1:53 AM  Result Value Ref Range   C difficile by pcr NEGATIVE NEGATIVE  CBC     Status: Abnormal   Collection Time: 04/25/14  4:36 AM  Result Value Ref Range   WBC 9.4 4.0 - 10.5 K/uL   RBC 2.65 (L) 3.87 - 5.11 MIL/uL   Hemoglobin 7.9 (L) 12.0 - 15.0 g/dL   HCT 24.7 (L) 36.0 - 46.0 %   MCV 93.2 78.0 - 100.0 fL   MCH 29.8  26.0 - 34.0 pg   MCHC 32.0 30.0 - 36.0 g/dL   RDW 14.7 11.5 - 15.5 %   Platelets 537 (H) 150 - 400 K/uL  Renal function panel     Status: Abnormal   Collection Time: 04/25/14  4:36 AM  Result Value Ref Range   Sodium 138 135 - 145 mmol/L   Potassium 3.6 3.5 - 5.1 mmol/L   Chloride 108 96 - 112 mmol/L   CO2 22 19 - 32 mmol/L   Glucose, Bld 84 70 - 99 mg/dL   BUN 24 (H) 6 - 23 mg/dL   Creatinine, Ser 3.70 (H) 0.50 - 1.10 mg/dL   Calcium 8.3 (L) 8.4 - 10.5 mg/dL   Phosphorus 4.5 2.3 - 4.6 mg/dL   Albumin 2.6 (L) 3.5 - 5.2 g/dL   GFR calc non Af Amer 12 (L) >90 mL/min   GFR calc Af Amer 14 (L) >90 mL/min    Comment: (NOTE) The eGFR has been calculated using the CKD EPI equation. This calculation has not been validated in all clinical situations. eGFR's persistently <90 mL/min signify possible Chronic Kidney Disease.    Anion gap 8 5 - 15  Renal function panel     Status: Abnormal   Collection Time: 04/26/14  3:21 AM  Result Value Ref Range   Sodium 138 135 - 145 mmol/L   Potassium 3.5 3.5 - 5.1 mmol/L   Chloride 109 96 - 112 mmol/L   CO2 20 19 - 32 mmol/L   Glucose, Bld 81 70 - 99 mg/dL   BUN 22 6 - 23 mg/dL   Creatinine, Ser 3.61 (H) 0.50 - 1.10 mg/dL   Calcium 8.4 8.4 - 10.5 mg/dL   Phosphorus 5.0 (H) 2.3 - 4.6 mg/dL   Albumin 3.3 (L) 3.5 - 5.2 g/dL   GFR calc non Af Amer 12 (L) >90 mL/min   GFR calc Af Amer 14 (L) >90 mL/min    Comment: (NOTE) The eGFR has been calculated using the CKD EPI equation. This calculation has not been validated in all clinical situations. eGFR's persistently <90 mL/min signify possible Chronic Kidney Disease.    Anion gap 9 5 - 15    Imaging / Studies: Ct Abdomen Pelvis Wo Contrast  04/24/2014   CLINICAL DATA:  Status post splenectomy and distal pancreatectomy, post drainage of intra-abdominal abscess  EXAM: CT ABDOMEN AND PELVIS WITHOUT CONTRAST  TECHNIQUE: Multidetector CT imaging of the abdomen  and pelvis was performed following the  standard protocol without IV contrast.  COMPARISON:  04/15/2014 and 04/16/2014  FINDINGS: Sagittal images of the spine shows diffuse osteopenia. Mild degenerative changes lumbar spine. There is moderate left pleural effusion with left lower lobe atelectasis.  Again noted status post cholecystectomy. Unenhanced liver is unremarkable. The patient is status post splenectomy and distal pancreatectomy. A percutaneous drainage catheter is noted in left flank with tip in left upper quadrant anterior to upper pole of the left kidney. There is significant improvement from prior exam. The previous subdiaphragmatic fluid has resolved. The collection anterior to left kidney has resolved. Stable postsurgical changes at the site of distal pancreatectomy. Small residual seroma at this level measures 2.8 cm stable in size in appearance from prior exam. A cyst in midpole posterior aspect of the left kidney is stable. Postsurgical drain in left lower abdomen is stable. No new low abscess is noted in left abdomen. Only tiny amount of residual fluid/air is noted just anterior to the surgical drain in left upper abdomen axial image 14 measures about 2.4 cm . IVC filter in place again noted. There is no small bowel obstruction. No ascites or free air. Normal appendix partially visualized. Mild anasarca infiltration subcutaneous fat bilateral flank wall. Again noted subcutaneous round density consistent with subcutaneous injections lower anterior abdominal wall.  No nephrolithiasis. No hydronephrosis or hydroureter. No distal colonic obstruction. There is a Foley catheter within a decompressed urinary bladder. Air is noted within anterior aspect of the bladder.  IMPRESSION: 1. The patient is status post splenectomy and distal pancreatectomy. A percutaneous drainage catheter is noted in left flank with tip in left upper quadrant anterior to upper pole of the left kidney. There is significant improvement from prior exam. The previous  subdiaphragmatic fluid has resolved. The collection anterior to left kidney has resolved. 2. There is moderate left pleural effusion with left lower lobe atelectasis. 3. No new abscess collection is noted. Only tiny amount of residual fluid/air is noted just anterior to the surgical drain in left upper abdomen axial image 14 measures about 2.4 cm 4. Stable postsurgical changes at the site of distal pancreatectomy. 5. IVC filter in place again noted. 6. No small bowel or colonic obstruction. 7. There is Foley catheter in decompressed urinary bladder. Moderate air is noted anterior aspect of the bladder probable post instrumentation.   Electronically Signed   By: Lahoma Crocker M.D.   On: 04/24/2014 18:43    Medications / Allergies:  Scheduled Meds: . acetaminophen  650 mg Oral Q6H  . albumin human  25 g Intravenous 3 times per day  . atorvastatin  40 mg Oral q1800  . brimonidine  1 drop Left Eye BID   And  . timolol  1 drop Left Eye BID  . ciprofloxacin  500 mg Oral Q24H  . enoxaparin (LOVENOX) injection  80 mg Subcutaneous Q24H  . latanoprost  1 drop Both Eyes QHS  . primidone  50 mg Oral QHS  . sodium chloride  10-40 mL Intracatheter Q12H  . tamsulosin  0.4 mg Oral Daily  . traMADol  50 mg Oral 4 times per day   Continuous Infusions: . sodium chloride 50 mL/hr (04/25/14 1741)   PRN Meds:.albuterol, alum & mag hydroxide-simeth, lip balm, ondansetron **OR** ondansetron (ZOFRAN) IV, sodium chloride  Antibiotics: Anti-infectives    Start     Dose/Rate Route Frequency Ordered Stop   04/21/14 1000  ciprofloxacin (CIPRO) tablet 500 mg  500 mg Oral Every 24 hours 04/21/14 0849     04/20/14 1400  piperacillin-tazobactam (ZOSYN) IVPB 2.25 g  Status:  Discontinued     2.25 g 100 mL/hr over 30 Minutes Intravenous Every 6 hours 04/20/14 0713 04/21/14 0849   04/15/14 1800  vancomycin (VANCOCIN) 1,250 mg in sodium chloride 0.9 % 250 mL IVPB  Status:  Discontinued     1,250 mg 166.7 mL/hr over 90  Minutes Intravenous Every 12 hours 04/15/14 0801 04/17/14 0643   04/15/14 1500  piperacillin-tazobactam (ZOSYN) IVPB 3.375 g  Status:  Discontinued     3.375 g 12.5 mL/hr over 240 Minutes Intravenous 3 times per day 04/15/14 0801 04/20/14 1316   04/15/14 0900  vancomycin (VANCOCIN) 1,500 mg in sodium chloride 0.9 % 500 mL IVPB     1,500 mg 250 mL/hr over 120 Minutes Intravenous  Once 04/15/14 0801 04/15/14 1231   04/15/14 0830  piperacillin-tazobactam (ZOSYN) IVPB 3.375 g     3.375 g 100 mL/hr over 30 Minutes Intravenous  Once 04/15/14 0801 04/15/14 0928   04/08/14 1600  cefoTEtan (CEFOTAN) 2 g in dextrose 5 % 50 mL IVPB     2 g 100 mL/hr over 30 Minutes Intravenous Every 12 hours 04/08/14 1522 04/09/14 0530   04/08/14 0537  ceFAZolin (ANCEF) IVPB 2 g/50 mL premix     2 g 100 mL/hr over 30 Minutes Intravenous On call to O.R. 04/08/14 0537 04/08/14 0759        Assessment/Plan Carcinoma at the tail of the pancreas POD#18 robotic assisted converted to laparoscopic hand assisted distal pancreatectomy, splenectomy with partial colectomy---Dr. Barry Dienes -having diarrhea, nausea and vomiting.  c diff negative 3/27.  Afebrile.  Benign abdominal exam.  We are limited to treating her symptoms with just zofran.  Could be related to cipro, will change to IV and see if she tolerates it better.  Also back her diet down to liquids, add nutritional supplement -SCD/lovenox -mobilize, needs to increase her activity, PT eval and treat -DC dulcolax -tramadol and tylenol only for pain d/t lethargy/myoclonus  -gentle IVF LUQ subphrenic abscess -continue with drains -change cipro to IV D#5 -culture +pseudomonas Urinary retention -continue with foley for accurate I&Os -flomax Tremors  -primidone Acute renal failure---non oliguric ATN -appreciate renal assistance -if renal recommends stopping cipro, will do so -albumin  DVT/PE -lovenox per pharmacy Anemia -hgb 7.9, monitor  Eliza Grissinger,  ANP-BC [facility] Pager [phone](7A-4:30P)   04/26/2014 10:57 AM

## 2014-04-26 NOTE — Progress Notes (Signed)
[facility] Progress Note   Subjective: BP up w albumin, creat down slightly , UOP good.    Filed Vitals:   04/26/14 0030 04/26/14 0500 04/26/14 0510 04/26/14 0745  BP: 138/67  144/74 145/70  Pulse: 94  98 102  Temp: 98.5 F (36.9 C)  98.3 F (36.8 C) 99.1 F (37.3 C)  TempSrc: Oral  Oral Oral  Resp: 18  18 17   Height:      Weight:  101.6 kg (223 lb 15.8 oz)    SpO2: 97%  100% 95%   Exam: Alert, slight slurred speech No jvd Chest clear bilat, no rales or wheezing RRR no MRG Abd obese, drain L side, nontender, diminished BS 1-2+ edema bilat LE's Neuro nonfocal  UA 3/16 1.009, neg protein, 3-6wbc, 0-2 rbc UA 3/22 1.009, 6.0, 30 prot, 0-2 wbc/ rbc per hpf CXR 3/19 dense consolidation L base Wt's pending today I/O since admit > 35L in and 24L out, +11L  Assessment: 1. Acute renal failure -  nonoliguric ATN from contrast / IV vanc. Stalled recovery prob due to 3rd spacing and soft BP's, hypoperfusion.  BP's betterand creat down today. If not improving consider AIN from meds (cipro). Will stop albumin dosing and follow- hopefully is moving toward improvement 2. Lethargy/ myoclonus - avoiding neurontin/ narcotics, on tramadol for pain - looks pretty good to me today 3. Vol excess - stable, +LE edema, resp stable- will stop IVF and albumin since weight is up- no diuretic yet 4. Panc Ca, s/p distal pancreatectomy 5. HTN - holding home BP meds (dilt/ propranolol) 6. Hx DVT/PE 7. Anemia- situational but also due to AKI- will add ESA  Rec-  check labs in am.     Asiya Cutbirth A   04/26/2014, 12:44 PM     Recent Labs Lab 04/24/14 0400 04/25/14 0436 04/26/14 0321  NA 135 138 138  K 3.7 3.6 3.5  CL 103 108 109  CO2 22 22 20   GLUCOSE 96 84 81  BUN 23 24* 22  CREATININE 3.84* 3.70* 3.61*  CALCIUM 8.2* 8.3* 8.4  PHOS  --  4.5 5.0*    Recent Labs Lab 04/24/14 2039 04/25/14 0436 04/26/14 0321  AST 30  --   --   ALT 13  --   --   ALKPHOS 83   --   --   BILITOT 0.5  --   --   PROT 6.1  --   --   ALBUMIN 2.4* 2.6* 3.3*    Recent Labs Lab 04/23/14 0535 04/24/14 0400 04/25/14 0436  WBC 9.1 9.1 9.4  HGB 8.4* 8.6* 7.9*  HCT 26.4* 26.5* 24.7*  MCV 93.0 92.7 93.2  PLT 552* 549* 537*   . acetaminophen  650 mg Oral Q6H  . albumin human  25 g Intravenous 3 times per day  . atorvastatin  40 mg Oral q1800  . brimonidine  1 drop Left Eye BID   And  . timolol  1 drop Left Eye BID  . [START ON 04/27/2014] ciprofloxacin  400 mg Intravenous Q24H  . enoxaparin (LOVENOX) injection  80 mg Subcutaneous Q24H  . latanoprost  1 drop Both Eyes QHS  . primidone  50 mg Oral QHS  . sodium chloride  10-40 mL Intracatheter Q12H  . tamsulosin  0.4 mg Oral Daily  . traMADol  50 mg Oral 4 times per day   . sodium chloride 50 mL/hr (04/25/14 1741)   albuterol, alum & mag hydroxide-simeth, lip balm, ondansetron **OR**  ondansetron (ZOFRAN) IV, sodium chloride

## 2014-04-27 LAB — RENAL FUNCTION PANEL
Albumin: 3.3 g/dL — ABNORMAL LOW (ref 3.5–5.2)
Anion gap: 14 (ref 5–15)
BUN: 21 mg/dL (ref 6–23)
CO2: 18 mmol/L — ABNORMAL LOW (ref 19–32)
Calcium: 8.4 mg/dL (ref 8.4–10.5)
Chloride: 108 mmol/L (ref 96–112)
Creatinine, Ser: 3.54 mg/dL — ABNORMAL HIGH (ref 0.50–1.10)
GFR calc non Af Amer: 12 mL/min — ABNORMAL LOW (ref >90)
GFR, EST AFRICAN AMERICAN: 14 mL/min — AB (ref >90)
Glucose, Bld: 81 mg/dL (ref 70–99)
Phosphorus: 4.6 mg/dL (ref 2.3–4.6)
Potassium: 3.3 mmol/L — ABNORMAL LOW (ref 3.5–5.1)
SODIUM: 140 mmol/L (ref 135–145)

## 2014-04-27 LAB — HEPARIN ANTI-XA: Heparin LMW: 0.96 IU/mL

## 2014-04-27 MED ORDER — GUAIFENESIN ER 600 MG PO TB12
600.0000 mg | ORAL_TABLET | Freq: Two times a day (BID) | ORAL | Status: DC
  Administered 2014-04-27 – 2014-04-28 (×3): 600 mg via ORAL
  Filled 2014-04-27 (×6): qty 1

## 2014-04-27 MED ORDER — BOOST / RESOURCE BREEZE PO LIQD
1.0000 | Freq: Three times a day (TID) | ORAL | Status: DC
  Administered 2014-04-27: 1 via ORAL

## 2014-04-27 NOTE — Progress Notes (Signed)
CSW assisting with d/c planning. PN reviewed. Met with pt this am. " I'm not feeling that good. I try to eat but it goes right through me. " Pt feels discouraged. Emotional support provided. Pt's not stable for d/c.  Werner Lean LCSW [phone]

## 2014-04-27 NOTE — Progress Notes (Signed)
Patient ID: [name], female   DOB: 12-Jan-1946, 69 y.o.   MRN: [id number]     [address] [place], [place] [address]    Phone: [phone] FAX: [phone]     Subjective: Less nausea, vomit phlegm, no bile or food.  Loose stools.  Walked x1 yesterday.  UOP adequate.  Afebrile.  VSS. Drains #1 73m serosanguinous.  Drain #2 15 serous.   sCr better today.  Albumin and IVF stopped.   Objective:  Vital signs:  Filed Vitals:   04/26/14 0745 04/26/14 1335 04/26/14 2210 04/27/14 0410  BP: 145/70 152/72 145/63   Pulse: 102 90 99   Temp: 99.1 F (37.3 C) 98.3 F (36.8 C) 98.6 F (37 C)   TempSrc: Oral Oral Oral   Resp: 17 17 17    Height:      Weight:    98.6 kg (217 lb 6 oz)  SpO2: 95% 100% 97%     Last BM Date: 04/26/14  Intake/Output   Yesterday:  03/28 0701 - 03/29 0700 In: 500 [P.O.:480] Out: 1325 [Urine:1300; Drains:25] This shift:  Total I/O In: 60 [P.O.:60] Out: 200 [Urine:200]   Physical Exam: General: Pt awake/alert/oriented x4 in no acute distress Chest: clear. No chest wall pain w good excursion CV: Pulses intact. Regular rhythm MS: Normal AROM mjr joints. No obvious deformity Abdomen: Soft. Nondistended. Mildly tender at incisions only. Drain #1 light serosanguinous output. Drain #2--serous output. Incisions are c/d/i No evidence of peritonitis. No incarcerated hernias. Ext: SCDs BLE. No mjr edema. 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 Active Problems:   Carcinoma of tail of pancreas   Abscess of abdominal cavity    Results:   Labs: Results for orders placed or performed during the hospital encounter of 04/08/14 (from the past 48 hour(s))  Renal function panel     Status: Abnormal   Collection Time: 04/26/14  3:21 AM  Result  Value Ref Range   Sodium 138 135 - 145 mmol/L   Potassium 3.5 3.5 - 5.1 mmol/L   Chloride 109 96 - 112 mmol/L   CO2 20 19 - 32 mmol/L   Glucose, Bld 81 70 - 99 mg/dL   BUN 22 6 - 23 mg/dL   Creatinine, Ser 3.61 (H) 0.50 - 1.10 mg/dL   Calcium 8.4 8.4 - 10.5 mg/dL   Phosphorus 5.0 (H) 2.3 - 4.6 mg/dL   Albumin 3.3 (L) 3.5 - 5.2 g/dL   GFR calc non Af Amer 12 (L) >90 mL/min   GFR calc Af Amer 14 (L) >90 mL/min    Comment: (NOTE) The eGFR has been calculated using the CKD EPI equation. This calculation has not been validated in all clinical situations. eGFR's persistently <90 mL/min signify possible Chronic Kidney Disease.    Anion gap 9 5 - 15  Renal function panel     Status: Abnormal   Collection Time: 04/27/14  4:55 AM  Result Value Ref Range   Sodium 140 135 - 145 mmol/L   Potassium 3.3 (L) 3.5 - 5.1 mmol/L   Chloride 108 96 - 112 mmol/L   CO2 18 (L) 19 - 32 mmol/L   Glucose, Bld 81 70 - 99 mg/dL   BUN 21 6 - 23 mg/dL   Creatinine, Ser 3.54 (H) 0.50 - 1.10 mg/dL  Calcium 8.4 8.4 - 10.5 mg/dL   Phosphorus 4.6 2.3 - 4.6 mg/dL   Albumin 3.3 (L) 3.5 - 5.2 g/dL   GFR calc non Af Amer 12 (L) >90 mL/min   GFR calc Af Amer 14 (L) >90 mL/min    Comment: (NOTE) The eGFR has been calculated using the CKD EPI equation. This calculation has not been validated in all clinical situations. eGFR's persistently <90 mL/min signify possible Chronic Kidney Disease.    Anion gap 14 5 - 15    Imaging / Studies: No results found.  Medications / Allergies:  Scheduled Meds: . acetaminophen  650 mg Oral Q6H  . atorvastatin  40 mg Oral q1800  . brimonidine  1 drop Left Eye BID   And  . timolol  1 drop Left Eye BID  . ciprofloxacin  400 mg Intravenous Q24H  . darbepoetin (ARANESP) injection - NON-DIALYSIS  100 mcg Subcutaneous Q Tue-1800  . enoxaparin (LOVENOX) injection  80 mg Subcutaneous Q24H  . latanoprost  1 drop Both Eyes QHS  . primidone  50 mg Oral QHS  . sodium chloride   10-40 mL Intracatheter Q12H  . tamsulosin  0.4 mg Oral Daily  . traMADol  50 mg Oral 4 times per day   Continuous Infusions:  PRN Meds:.albuterol, alum & mag hydroxide-simeth, lip balm, ondansetron **OR** ondansetron (ZOFRAN) IV, sodium chloride  Antibiotics: Anti-infectives    Start     Dose/Rate Route Frequency Ordered Stop   04/27/14 1000  ciprofloxacin (CIPRO) IVPB 400 mg     400 mg 200 mL/hr over 60 Minutes Intravenous Every 24 hours 04/26/14 1038     04/21/14 1000  ciprofloxacin (CIPRO) tablet 500 mg  Status:  Discontinued     500 mg Oral Every 24 hours 04/21/14 0849 04/26/14 1038   04/20/14 1400  piperacillin-tazobactam (ZOSYN) IVPB 2.25 g  Status:  Discontinued     2.25 g 100 mL/hr over 30 Minutes Intravenous Every 6 hours 04/20/14 0713 04/21/14 0849   04/15/14 1800  vancomycin (VANCOCIN) 1,250 mg in sodium chloride 0.9 % 250 mL IVPB  Status:  Discontinued     1,250 mg 166.7 mL/hr over 90 Minutes Intravenous Every 12 hours 04/15/14 0801 04/17/14 0643   04/15/14 1500  piperacillin-tazobactam (ZOSYN) IVPB 3.375 g  Status:  Discontinued     3.375 g 12.5 mL/hr over 240 Minutes Intravenous 3 times per day 04/15/14 0801 04/20/14 1316   04/15/14 0900  vancomycin (VANCOCIN) 1,500 mg in sodium chloride 0.9 % 500 mL IVPB     1,500 mg 250 mL/hr over 120 Minutes Intravenous  Once 04/15/14 0801 04/15/14 1231   04/15/14 0830  piperacillin-tazobactam (ZOSYN) IVPB 3.375 g     3.375 g 100 mL/hr over 30 Minutes Intravenous  Once 04/15/14 0801 04/15/14 0928   04/08/14 1600  cefoTEtan (CEFOTAN) 2 g in dextrose 5 % 50 mL IVPB     2 g 100 mL/hr over 30 Minutes Intravenous Every 12 hours 04/08/14 1522 04/09/14 0530   04/08/14 0537  ceFAZolin (ANCEF) IVPB 2 g/50 mL premix     2 g 100 mL/hr over 30 Minutes Intravenous On call to O.R. 04/08/14 7209 04/08/14 0759       Assessment/Plan Carcinoma at the tail of the pancreas POD#19 robotic assisted converted to laparoscopic hand assisted distal  pancreatectomy, splenectomy with partial colectomy---Dr. Barry Dienes -having diarrhea, nausea and vomiting, but overall better.  zofran last given 3/28.  c diff negative 3/27. Afebrile. Benign abdominal exam. We are  limited to treating her symptoms with just zofran. -give fulls, consult to dietician -SCD/lovenox -mobilize, needs to increase her activity, PT eval and treat -tramadol and tylenol only for pain d/t lethargy/myoclonus  LUQ subphrenic abscess -continue with drains -c/w cipro IV D#6(?cause of nausea) -culture +pseudomonas Urinary retention -continue with foley for accurate I&Os -flomax Tremors  -primidone Acute renal failure---non oliguric ATN -appreciate renal assistance -if renal recommends stopping cipro, will do so -albumin  DVT/PE -lovenox per pharmacy Anemia -hgb 7.9, monitor Dispo--continue inpatient, await improvement and renal function and resolution of nausea, then SNF  Erby Pian, [facility] Pager [phone](7A-4:30P)   04/27/2014 10:15 AM

## 2014-04-27 NOTE — Progress Notes (Signed)
INITIAL NUTRITION ASSESSMENT  DOCUMENTATION CODES Per approved criteria  -Non-severe (moderate) malnutrition in the context of acute illness or injury -Obesity, unspecified  Pt meets criteria for moderate MALNUTRITION in the context of acute illness as evidenced by energy intake of <75% for > 7 days and mild fluid accumulation.  INTERVENTION: Provide Resource Breeze po TID, each supplement provides 250 kcal and 9 grams of protein Encourage PO intake RD to continue to monitor for supplemental needs  NUTRITION DIAGNOSIS: Inadequate oral intake related to poor appetite as evidenced by poor PO intake of 5%.   Goal: Pt to meet >/= 90% of their estimated nutrition needs   Monitor:  PO and supplemental intake, weight, labs, I/O's  Reason for Assessment: Consult for nutritional assessment d/t poor PO intake  Admitting Dx: Primary cancer of tail of pancreas  ASSESSMENT: 69 year old female who presents with pancreatic cancer. S/p Procedure(s): XI ROBOTIC ASSISTED LAPAROSCOPIC DISTAL PANCREATECTOMY/SPLENECTOMY   Pt in room with nurse tech. Pt reports eating well PTA. Pt did develop diarrhea around 3/17. Pt's appetite has been poor. Pt had a bite of pudding and a sip of tomato soup for lunch and states she did not tolerate them well.   Pt with steady weight. Pt with non-pitting generalized and RLE & LLE edema.  Pt is willing to try Resource Breeze supplements to start out with. RD provided supplement for pt to try. RD to order.  Nutrition focused physical exam shows no sign of depletion of muscle mass or body fat.  Labs reviewed: Low K Elevated Creatinine  Height: Ht Readings from Last 1 Encounters:  04/08/14 5' 9.5" (1.765 m)    Weight: Wt Readings from Last 1 Encounters:  04/27/14 217 lb 6 oz (98.6 kg)    Ideal Body Weight: 145 lb  % Ideal Body Weight: 150%  Wt Readings from Last 10 Encounters:  04/27/14 217 lb 6 oz (98.6 kg)  04/01/14 202 lb (91.627 kg)  03/22/14  210 lb 6.4 oz (95.437 kg)  03/19/14 212 lb 12.8 oz (96.525 kg)  02/26/14 216 lb (97.977 kg)  02/16/14 216 lb 12.8 oz (98.34 kg)  01/04/14 218 lb (98.884 kg)  12/31/13 212 lb 9.6 oz (96.435 kg)  12/21/13 211 lb (95.709 kg)  12/11/13 211 lb 6.4 oz (95.89 kg)    Usual Body Weight: 220 lb -per pt  % Usual Body Weight: 99%  BMI:  Body mass index is 31.65 kg/(m^2).  Estimated Nutritional Needs: Kcal: 1800-2000 Protein: 75-85g Fluid: 1.8L/day  Skin: abdominal incision  Diet Order: Diet full liquid Room service appropriate?: Yes; Fluid consistency:: Thin  EDUCATION NEEDS: -No education needs identified at this time   Intake/Output Summary (Last 24 hours) at 04/27/14 1230 Last data filed at 04/27/14 1004  Gross per 24 hour  Intake    580 ml  Output   1525 ml  Net   -945 ml    Last BM: 3/29  Labs:   Recent Labs Lab 04/25/14 0436 04/26/14 0321 04/27/14 0455  NA 138 138 140  K 3.6 3.5 3.3*  CL 108 109 108  CO2 22 20 18*  BUN 24* 22 21  CREATININE 3.70* 3.61* 3.54*  CALCIUM 8.3* 8.4 8.4  PHOS 4.5 5.0* 4.6  GLUCOSE 84 81 81    CBG (last 3)  No results for input(s): GLUCAP in the last 72 hours.  Scheduled Meds: . acetaminophen  650 mg Oral Q6H  . atorvastatin  40 mg Oral q1800  . brimonidine  1  drop Left Eye BID   And  . timolol  1 drop Left Eye BID  . ciprofloxacin  400 mg Intravenous Q24H  . darbepoetin (ARANESP) injection - NON-DIALYSIS  100 mcg Subcutaneous Q Tue-1800  . enoxaparin (LOVENOX) injection  80 mg Subcutaneous Q24H  . guaiFENesin  600 mg Oral BID  . latanoprost  1 drop Both Eyes QHS  . primidone  50 mg Oral QHS  . sodium chloride  10-40 mL Intracatheter Q12H  . tamsulosin  0.4 mg Oral Daily  . traMADol  50 mg Oral 4 times per day    Continuous Infusions:   Past Medical History  Diagnosis Date  . Glaucoma   . Clotting disorder   . DVT (deep venous thrombosis)     LEFT  . GERD (gastroesophageal reflux disease)   .  Hypercholesterolemia   . Peripheral neuropathy   . Essential tremor   . Diverticulosis   . Arthritis     knees and back  . Cancer     Pancreatic  . Hypertension   . Colon polyps   . UTI (lower urinary tract infection)   . Complication of anesthesia     slow to wak up   . Pulmonary embolism     Past Surgical History  Procedure Laterality Date  . Cholecystectomy    . Abdominal hysterectomy    . Knee surgery Bilateral   . Greenfield filter    . Back surgery    . Eus N/A 02/26/2014    Procedure: UPPER ENDOSCOPIC ULTRASOUND (EUS) LINEAR;  Surgeon: Beryle Beams, MD;  Location: [facility];  Service: Endoscopy;  Laterality: N/A;  . Fine needle aspiration N/A 02/26/2014    Procedure: FINE NEEDLE ASPIRATION (FNA) LINEAR;  Surgeon: Beryle Beams, MD;  Location: [facility];  Service: Endoscopy;  Laterality: N/A;  . Breast lumpectomy Right   . Spine surgery    . Xi robotic assisted laparoscopic distal pancreatectomy N/A 04/08/2014    Procedure: XI ROBOTIC ASSISTED CONVERTED LAPAROSCOPIC HAND ASSITED DISTAL PANCREATECTOMY/SPLENECTOMY WITH PARTIAL COLECTOMY;  Surgeon: [name], MD;  Location: [facility];  Service: General;  Laterality: N/A;    Clayton Bibles, MS, RD, LDN Pager: [phone] After Hours Pager: [phone]

## 2014-04-27 NOTE — Progress Notes (Signed)
[facility] NOTE  Pharmacy Consult for Lovenox Indication: h/o recurrent DVT   Allergies  Allergen Reactions  . Sulfa Antibiotics Swelling    Patient Measurements: Height: 5' 9.5" (176.5 cm) Weight: 217 lb 6 oz (98.6 kg) IBW/kg (Calculated) : 67.35   Vital Signs:    Labs:  Recent Labs  04/25/14 0436 04/26/14 0321 04/27/14 0455  HGB 7.9*  --   --   HCT 24.7*  --   --   PLT 537*  --   --   CREATININE 3.70* 3.61* 3.54*    Estimated Creatinine Clearance: 19.5 mL/min (by C-G formula based on Cr of 3.54).   Medical History: Past Medical History  Diagnosis Date  . Glaucoma   . Clotting disorder   . DVT (deep venous thrombosis)     LEFT  . GERD (gastroesophageal reflux disease)   . Hypercholesterolemia   . Peripheral neuropathy   . Essential tremor   . Diverticulosis   . Arthritis     knees and back  . Cancer     Pancreatic  . Hypertension   . Colon polyps   . UTI (lower urinary tract infection)   . Complication of anesthesia     slow to wak up   . Pulmonary embolism     Medications:  Scheduled:  . acetaminophen  650 mg Oral Q6H  . atorvastatin  40 mg Oral q1800  . brimonidine  1 drop Left Eye BID   And  . timolol  1 drop Left Eye BID  . ciprofloxacin  400 mg Intravenous Q24H  . darbepoetin (ARANESP) injection - NON-DIALYSIS  100 mcg Subcutaneous Q Tue-1800  . enoxaparin (LOVENOX) injection  80 mg Subcutaneous Q24H  . guaiFENesin  600 mg Oral BID  . latanoprost  1 drop Both Eyes QHS  . primidone  50 mg Oral QHS  . sodium chloride  10-40 mL Intracatheter Q12H  . tamsulosin  0.4 mg Oral Daily  . traMADol  50 mg Oral 4 times per day   Infusions:    PRN: albuterol, alum & mag hydroxide-simeth, lip balm, ondansetron **OR** ondansetron (ZOFRAN) IV, sodium chloride  Assessment: 69 y/o F with pancreatic adenocarcinoma, s/p distal pancreatectomy and splenectomy, partial colectomy 04/08/14.    Anticoagulation:  Prior to  admission pt was on full-dose Lovenox (100mg  SQ q12h) for recurrent DVT; this was interrupted for surgery and resumed 3/19.    Also on 3/19 patient developed acute kidney injury with SCr 1.84 and estimated CrCl < 30 mL/min, so attending MD reduced Lovenox to 100mg  SQ q24h.  In the interim, the SCr continued rising and peaked at 3.94 on 3/23 with estimated CrCl < 20 mL/min.    Given the potential for Lovenox accumulation in this setting, an order was obtained for pharmacy to assist with dosing.  Anti-Xa level drawn 3/25 at 1100 (six hours post-dose) 1.15, which was within the laboratory's therapeutic range of 0.5 - 1.2 but slightly above the optimal range of 0.6 -1.0.  Suboptimal draw time (six hours rather than four hours post-dose) was noted.  Dosage was initially left unchanged, but because of persistent elevation in SCr with potential for further drug accumulation, on 3/27 dosage was reduced to 90 mg SQ q24h x 1 day, then 80 mg SQ q24h.  Today, 04/27/2014:  Anti-Xa level therapeutic at 0.96 (note goal range above). Note that level drawn appropriately 4 hrs after dose charted as given  No reported bleeding  Anticoagulation: Lovenox 80 mg  SQ q24h SCr 3.54, slowly improving.  Estimated CrCl ~ 20 mL/min UOP ~ 0.7 mL/kg/hr Hgb low, Pltc elevated on 3/27.   No bleeding reported in chart note.  Goal of Therapy:  Full anticoagulation  Adjust Lovenox for renal function Anti-Xa level 0.6-1.0 unit/mL   Plan:  1. Continue Lovenox 80 mg SQ q24h 2. Follow renal function; anticipate eventual need to return to q12h dosing.   Adrian Saran, PharmD, BCPS Pager [phone] 04/27/2014 12:31 PM

## 2014-04-27 NOTE — Progress Notes (Signed)
[facility] Progress Note   Subjective: BP up , creat down slightly , UOP good.    Filed Vitals:   04/26/14 1335 04/26/14 2210 04/27/14 0410 04/27/14 1336  BP: 152/72 145/63  156/73  Pulse: 90 99  91  Temp: 98.3 F (36.8 C) 98.6 F (37 C)  98 F (36.7 C)  TempSrc: Oral Oral  Oral  Resp: 17 17  18   Height:      Weight:   98.6 kg (217 lb 6 oz)   SpO2: 100% 97%  96%   Exam: Alert, slight slurred speech No jvd Chest clear bilat, no rales or wheezing RRR no MRG Abd obese, drain L side, nontender, diminished BS 1-2+ edema bilat LE's Neuro nonfocal  UA 3/16 1.009, neg protein, 3-6wbc, 0-2 rbc UA 3/22 1.009, 6.0, 30 prot, 0-2 wbc/ rbc per hpf CXR 3/19 dense consolidation L base Wt's pending today I/O since admit > 35L in and 24L out, +11L  Assessment: 1. Acute renal failure -  nonoliguric ATN from contrast / IV vanc. Stalled recovery prob due to 3rd spacing and soft BP's, hypoperfusion.  BP's better and creat down again today. She would not need to have creatinine down to baseline in order to go home- if was ready from other fronts could maybe be followed as an OP 2. Lethargy/ myoclonus - avoiding neurontin/ narcotics, on tramadol for pain - looks pretty good to me today 3. Vol excess - stable, +LE edema, resp stable- have stopped IVF- weight is down- diuresing on own for now- consider diuretics if blood pressure stays up 4. Panc Ca, s/p distal pancreatectomy 5. HTN - holding home BP meds (dilt/ propranolol)- is porobably up due to volume 6. Hx DVT/PE 7. Anemia- situational but also due to AKI- have added  ESA      Willeen Novak A   04/27/2014, 2:43 PM     Recent Labs Lab 04/25/14 0436 04/26/14 0321 04/27/14 0455  NA 138 138 140  K 3.6 3.5 3.3*  CL 108 109 108  CO2 22 20 18*  GLUCOSE 84 81 81  BUN 24* 22 21  CREATININE 3.70* 3.61* 3.54*  CALCIUM 8.3* 8.4 8.4  PHOS 4.5 5.0* 4.6    Recent Labs Lab 04/24/14 2039 04/25/14 0436  04/26/14 0321 04/27/14 0455  AST 30  --   --   --   ALT 13  --   --   --   ALKPHOS 83  --   --   --   BILITOT 0.5  --   --   --   PROT 6.1  --   --   --   ALBUMIN 2.4* 2.6* 3.3* 3.3*    Recent Labs Lab 04/23/14 0535 04/24/14 0400 04/25/14 0436  WBC 9.1 9.1 9.4  HGB 8.4* 8.6* 7.9*  HCT 26.4* 26.5* 24.7*  MCV 93.0 92.7 93.2  PLT 552* 549* 537*   . acetaminophen  650 mg Oral Q6H  . atorvastatin  40 mg Oral q1800  . brimonidine  1 drop Left Eye BID   And  . timolol  1 drop Left Eye BID  . ciprofloxacin  400 mg Intravenous Q24H  . darbepoetin (ARANESP) injection - NON-DIALYSIS  100 mcg Subcutaneous Q Tue-1800  . enoxaparin (LOVENOX) injection  80 mg Subcutaneous Q24H  . feeding supplement (RESOURCE BREEZE)  1 Container Oral TID BM  . guaiFENesin  600 mg Oral BID  . latanoprost  1 drop Both Eyes QHS  . primidone  50  mg Oral QHS  . sodium chloride  10-40 mL Intracatheter Q12H  . tamsulosin  0.4 mg Oral Daily  . traMADol  50 mg Oral 4 times per day     albuterol, alum & mag hydroxide-simeth, lip balm, ondansetron **OR** ondansetron (ZOFRAN) IV, sodium chloride

## 2014-04-28 DIAGNOSIS — E440 Moderate protein-calorie malnutrition: Secondary | ICD-10-CM | POA: Insufficient documentation

## 2014-04-28 LAB — RENAL FUNCTION PANEL
ALBUMIN: 3.1 g/dL — AB (ref 3.5–5.2)
Anion gap: 10 (ref 5–15)
BUN: 18 mg/dL (ref 6–23)
CO2: 21 mmol/L (ref 19–32)
Calcium: 8.6 mg/dL (ref 8.4–10.5)
Chloride: 109 mmol/L (ref 96–112)
Creatinine, Ser: 2.97 mg/dL — ABNORMAL HIGH (ref 0.50–1.10)
GFR calc Af Amer: 18 mL/min — ABNORMAL LOW (ref >90)
GFR calc non Af Amer: 15 mL/min — ABNORMAL LOW (ref >90)
GLUCOSE: 91 mg/dL (ref 70–99)
Phosphorus: 4.5 mg/dL (ref 2.3–4.6)
Potassium: 3.2 mmol/L — ABNORMAL LOW (ref 3.5–5.1)
Sodium: 140 mmol/L (ref 135–145)

## 2014-04-28 LAB — CBC
HCT: 25.2 % — ABNORMAL LOW (ref 36.0–46.0)
HEMOGLOBIN: 7.9 g/dL — AB (ref 12.0–15.0)
MCH: 29 pg (ref 26.0–34.0)
MCHC: 31.3 g/dL (ref 30.0–36.0)
MCV: 92.6 fL (ref 78.0–100.0)
PLATELETS: 448 10*3/uL — AB (ref 150–400)
RBC: 2.72 MIL/uL — AB (ref 3.87–5.11)
RDW: 14.9 % (ref 11.5–15.5)
WBC: 9 10*3/uL (ref 4.0–10.5)

## 2014-04-28 MED ORDER — POTASSIUM CHLORIDE CRYS ER 20 MEQ PO TBCR
40.0000 meq | EXTENDED_RELEASE_TABLET | Freq: Every day | ORAL | Status: AC
  Administered 2014-04-28: 40 meq via ORAL
  Filled 2014-04-28 (×2): qty 2

## 2014-04-28 MED ORDER — PANTOPRAZOLE SODIUM 40 MG PO TBEC
40.0000 mg | DELAYED_RELEASE_TABLET | Freq: Every day | ORAL | Status: DC
  Administered 2014-04-28 – 2014-05-04 (×7): 40 mg via ORAL
  Filled 2014-04-28 (×7): qty 1

## 2014-04-28 MED ORDER — ENSURE ENLIVE PO LIQD
237.0000 mL | Freq: Three times a day (TID) | ORAL | Status: DC
  Administered 2014-04-28 – 2014-04-30 (×4): 237 mL via ORAL

## 2014-04-28 NOTE — Progress Notes (Addendum)
Calorie Count Note  48-hour calorie count ordered.  Diet: Full Liquid Supplements: d/c Resource Breeze, add Ensure Enlive po TID, each supplement provides 350 kcal and 20 grams of protein  Instructions provided in Calorie Count order: "Please hang calorie count envelope on the patient's door. Document percent consumed for each item on the patient's meal tray ticket and keep in envelope. Also document percent of any supplement or snack pt consumes and keep documentation in envelope for RD to review"  - RD to review po intake tomorrow 3/31.  Nutrition Dx: Inadequate oral intake related to poor appetite as evidenced by poor PO intake of 5%  Goal: Pt to meet >/= 90% of their estimated nutrition needs   [place], [place], [place]

## 2014-04-28 NOTE — Progress Notes (Signed)
Referring Physician(s): CCS  Subjective:  Pt still pretty weak. Not much activity Had repeat CT 3/26 showing marked improvement/resolution of a few fluid collections  Allergies: Sulfa antibiotics  Medications:  Current facility-administered medications:  .  acetaminophen (TYLENOL) tablet 650 mg, 650 mg, Oral, Q6H, [name], MD, 650 mg at 04/28/14 1340 .  albuterol (PROVENTIL) (2.5 MG/3ML) 0.083% nebulizer solution 2.5 mg, 2.5 mg, Nebulization, Q6H PRN, Armandina Gemma, MD, 2.5 mg at 04/17/14 0117 .  alum & mag hydroxide-simeth (MAALOX/MYLANTA) 200-200-20 MG/5ML suspension 30 mL, 30 mL, Oral, Q6H PRN, [name], MD .  atorvastatin (LIPITOR) tablet 40 mg, 40 mg, Oral, q1800, [name], MD, 40 mg at 04/27/14 1737 .  brimonidine (ALPHAGAN) 0.2 % ophthalmic solution 1 drop, 1 drop, Left Eye, BID, 1 drop at 04/28/14 1009 **AND** timolol (TIMOPTIC) 0.5 % ophthalmic solution 1 drop, 1 drop, Left Eye, BID, [name], MD, 1 drop at 04/28/14 1009 .  ciprofloxacin (CIPRO) IVPB 400 mg, 400 mg, Intravenous, Q24H, Randall K Absher, RPH, 400 mg at 04/28/14 1009 .  Darbepoetin Alfa (ARANESP) injection 100 mcg, 100 mcg, Subcutaneous, Q Tue-1800, Corliss Parish, MD, 100 mcg at 04/27/14 1736 .  enoxaparin (LOVENOX) injection 80 mg, 80 mg, Subcutaneous, Q24H, Angela Adam, RPH, 80 mg at 04/28/14 0500 .  feeding supplement (ENSURE ENLIVE) (ENSURE ENLIVE) liquid 237 mL, 237 mL, Oral, TID BM, Dorann Ou, RD, 237 mL at 04/28/14 1500 .  guaiFENesin (MUCINEX) 12 hr tablet 600 mg, 600 mg, Oral, BID, Leighton Ruff, MD, 478 mg at 04/28/14 1009 .  latanoprost (XALATAN) 0.005 % ophthalmic solution 1 drop, 1 drop, Both Eyes, QHS, [name], MD, 1 drop at 04/27/14 2300 .  lip balm (CARMEX) ointment, , Topical, PRN, [name], MD .  ondansetron (ZOFRAN) tablet 4 mg, 4 mg, Oral, Q6H PRN, 4 mg at 04/28/14 0830 **OR** ondansetron (ZOFRAN) injection 4 mg, 4 mg, Intravenous, Q6H PRN, [name], MD, 4 mg at 04/28/14 1419 .  pantoprazole (PROTONIX) EC tablet 40 mg, 40 mg, Oral, Daily, Emina Riebock, NP, 40 mg at 04/28/14 1147 .  potassium chloride SA (K-DUR,KLOR-CON) CR tablet 40 mEq, 40 mEq, Oral, Daily, Corliss Parish, MD, 40 mEq at 04/28/14 1147 .  primidone (MYSOLINE) tablet 50 mg, 50 mg, Oral, QHS, [name], MD, 50 mg at 04/27/14 2258 .  sodium chloride 0.9 % injection 10-40 mL, 10-40 mL, Intracatheter, Q12H, [name], MD, 10 mL at 04/18/14 2200 .  sodium chloride 0.9 % injection 10-40 mL, 10-40 mL, Intracatheter, PRN, [name], MD, 10 mL at 04/28/14 1507 .  tamsulosin (FLOMAX) capsule 0.4 mg, 0.4 mg, Oral, Daily, [name], MD, 0.4 mg at 04/28/14 1009 .  traMADol (ULTRAM) tablet 50 mg, 50 mg, Oral, 4 times per day, [name], MD, 50 mg at 04/28/14 1147   Vital Signs: BP 135/61 mmHg  Pulse 82  Temp(Src) 98.8 F (37.1 C) (Oral)  Resp 18  Ht 5' 9.5" (1.765 m)  Wt 217 lb 9.5 oz (98.7 kg)  BMI 31.68 kg/m2  SpO2 100%  Physical Exam General: A&Ox3, NAD, sitting up in chair Abd: Soft, NT, ND, LUQ drains intact, some cloudy output.  Imaging: Ct Abdomen Pelvis Wo Contrast  04/24/2014   CLINICAL DATA:  Status post splenectomy and distal pancreatectomy, post drainage of intra-abdominal abscess  EXAM: CT ABDOMEN AND PELVIS WITHOUT CONTRAST  TECHNIQUE: Multidetector CT imaging of the abdomen and pelvis was performed following the standard protocol without IV contrast.  COMPARISON:  04/15/2014 and 04/16/2014  FINDINGS: Sagittal images of the spine shows diffuse osteopenia. Mild degenerative changes lumbar spine. There is moderate left pleural effusion with left lower lobe atelectasis.  Again noted status post cholecystectomy. Unenhanced liver is unremarkable. The patient is status post splenectomy and distal pancreatectomy. A percutaneous drainage catheter is noted in left flank with tip in left upper quadrant anterior to upper pole of the left kidney. There  is significant improvement from prior exam. The previous subdiaphragmatic fluid has resolved. The collection anterior to left kidney has resolved. Stable postsurgical changes at the site of distal pancreatectomy. Small residual seroma at this level measures 2.8 cm stable in size in appearance from prior exam. A cyst in midpole posterior aspect of the left kidney is stable. Postsurgical drain in left lower abdomen is stable. No new low abscess is noted in left abdomen. Only tiny amount of residual fluid/air is noted just anterior to the surgical drain in left upper abdomen axial image 14 measures about 2.4 cm . IVC filter in place again noted. There is no small bowel obstruction. No ascites or free air. Normal appendix partially visualized. Mild anasarca infiltration subcutaneous fat bilateral flank wall. Again noted subcutaneous round density consistent with subcutaneous injections lower anterior abdominal wall.  No nephrolithiasis. No hydronephrosis or hydroureter. No distal colonic obstruction. There is a Foley catheter within a decompressed urinary bladder. Air is noted within anterior aspect of the bladder.  IMPRESSION: 1. The patient is status post splenectomy and distal pancreatectomy. A percutaneous drainage catheter is noted in left flank with tip in left upper quadrant anterior to upper pole of the left kidney. There is significant improvement from prior exam. The previous subdiaphragmatic fluid has resolved. The collection anterior to left kidney has resolved. 2. There is moderate left pleural effusion with left lower lobe atelectasis. 3. No new abscess collection is noted. Only tiny amount of residual fluid/air is noted just anterior to the surgical drain in left upper abdomen axial image 14 measures about 2.4 cm 4. Stable postsurgical changes at the site of distal pancreatectomy. 5. IVC filter in place again noted. 6. No small bowel or colonic obstruction. 7. There is Foley catheter in decompressed  urinary bladder. Moderate air is noted anterior aspect of the bladder probable post instrumentation.   Electronically Signed   By: Lahoma Crocker M.D.   On: 04/24/2014 18:43    Labs:  CBC:  Recent Labs  04/23/14 0535 04/24/14 0400 04/25/14 0436 04/28/14 0413  WBC 9.1 9.1 9.4 9.0  HGB 8.4* 8.6* 7.9* 7.9*  HCT 26.4* 26.5* 24.7* 25.2*  PLT 552* 549* 537* 448*    COAGS:  Recent Labs  04/01/14 1400 04/08/14 0630 04/09/14 0404  INR 1.11 1.06 1.12  APTT  --  40* 33    BMP:  Recent Labs  04/25/14 0436 04/26/14 0321 04/27/14 0455 04/28/14 0413  NA 138 138 140 140  K 3.6 3.5 3.3* 3.2*  CL 108 109 108 109  CO2 22 20 18* 21  GLUCOSE 84 81 81 91  BUN 24* 22 21 18   CALCIUM 8.3* 8.4 8.4 8.6  CREATININE 3.70* 3.61* 3.54* 2.97*  GFRNONAA 12* 12* 12* 15*  GFRAA 14* 14* 14* 18*    LIVER FUNCTION TESTS:  Recent Labs  01/04/14 1107 02/16/14 1443 04/01/14 1400 04/24/14 2039 04/25/14 0436 04/26/14 0321 04/27/14 0455 04/28/14 0413  BILITOT 0.5 0.42 0.7 0.5  --   --   --   --   AST 29 24  35 30  --   --   --   --   ALT 10 10 21 13   --   --   --   --   ALKPHOS 80 96 75 83  --   --   --   --   PROT 6.8 7.2 7.6 6.1  --   --   --   --   ALBUMIN 3.6 3.6 3.9 2.4* 2.6* 3.3* 3.3* 3.1*    Assessment and Plan: S/p distal pancreatectomy/splenectomy/partial colectomy 04/08/14 for panc cancer LUQ abscess  S/p perc drain placed 3/18, Cx pseudomonas  Plans per CCS   Signed: Ascencion Dike 04/28/2014, 4:21 PM   I spent a total of 15 Minutes in face to face in clinical consultation/evaluation, greater than 50% of which was counseling/coordinating care for LUQ abscess.

## 2014-04-28 NOTE — Progress Notes (Signed)
[facility] Progress Note   Subjective: BP fine , creat down again , UOP good.    Filed Vitals:   04/27/14 1336 04/27/14 2128 04/28/14 0430 04/28/14 0517  BP: 156/73 143/63  138/64  Pulse: 91 94  87  Temp: 98 F (36.7 C) 98.2 F (36.8 C)  98.3 F (36.8 C)  TempSrc: Oral Oral  Oral  Resp: 18 18  18   Height:      Weight:   98.7 kg (217 lb 9.5 oz)   SpO2: 96% 99%  97%   Exam: Alert, slight slurred speech No jvd Chest clear bilat, no rales or wheezing RRR no MRG Abd obese, drain L side, nontender, diminished BS 1-2+ edema bilat LE's Neuro nonfocal   Assessment: 1. Acute renal failure -  nonoliguric ATN from contrast / IV vanc. Stalled recovery prob due to 3rd spacing and soft BP's, hypoperfusion.  BP's better and creat down again today. She would not need to have creatinine down to baseline in order to go home- if was ready from other fronts could maybe be followed as an OP.  I agree with voiding trial close to discharge 2. Lethargy/ myoclonus - avoiding neurontin/ narcotics, on tramadol for pain - looks pretty good to me today 3. Vol excess - stable, +LE edema, resp stable- have stopped IVF- weight is down- diuresing on own for now- consider diuretics if blood pressure stays up 4. Panc Ca, s/p distal pancreatectomy- slow progress- still with drains and N/V 5. HTN - holding home BP meds (dilt/ propranolol)- is probably up due to volume 6. Hx DVT/PE 7. Anemia- situational but also due to AKI- have added  ESA 8. Hypokalemia- will give a little replacement today  8. Dispo- Renal is going to follow at a distance but not write note daily - I will watch her labs and arrange follow up as OP- call if questions      Tommaso Cavitt A   04/28/2014, 11:28 AM     Recent Labs Lab 04/26/14 0321 04/27/14 0455 04/28/14 0413  NA 138 140 140  K 3.5 3.3* 3.2*  CL 109 108 109  CO2 20 18* 21  GLUCOSE 81 81 91  BUN 22 21 18   CREATININE 3.61* 3.54* 2.97*  CALCIUM  8.4 8.4 8.6  PHOS 5.0* 4.6 4.5    Recent Labs Lab 04/24/14 2039  04/26/14 0321 04/27/14 0455 04/28/14 0413  AST 30  --   --   --   --   ALT 13  --   --   --   --   ALKPHOS 83  --   --   --   --   BILITOT 0.5  --   --   --   --   PROT 6.1  --   --   --   --   ALBUMIN 2.4*  < > 3.3* 3.3* 3.1*  < > = values in this interval not displayed.  Recent Labs Lab 04/24/14 0400 04/25/14 0436 04/28/14 0413  WBC 9.1 9.4 9.0  HGB 8.6* 7.9* 7.9*  HCT 26.5* 24.7* 25.2*  MCV 92.7 93.2 92.6  PLT 549* 537* 448*   . acetaminophen  650 mg Oral Q6H  . atorvastatin  40 mg Oral q1800  . brimonidine  1 drop Left Eye BID   And  . timolol  1 drop Left Eye BID  . ciprofloxacin  400 mg Intravenous Q24H  . darbepoetin (ARANESP) injection - NON-DIALYSIS  100 mcg Subcutaneous Q Tue-1800  .  enoxaparin (LOVENOX) injection  80 mg Subcutaneous Q24H  . feeding supplement (RESOURCE BREEZE)  1 Container Oral TID BM  . guaiFENesin  600 mg Oral BID  . latanoprost  1 drop Both Eyes QHS  . pantoprazole  40 mg Oral Daily  . primidone  50 mg Oral QHS  . sodium chloride  10-40 mL Intracatheter Q12H  . tamsulosin  0.4 mg Oral Daily  . traMADol  50 mg Oral 4 times per day     albuterol, alum & mag hydroxide-simeth, lip balm, ondansetron **OR** ondansetron (ZOFRAN) IV, sodium chloride

## 2014-04-28 NOTE — Progress Notes (Signed)
PT Cancellation Note  Patient Details Name: [name] MRN: [id number] DOB: Apr 10, 1945   Cancelled Treatment:     pt in bed trying to keep down a couple spoonfuls of chicken broth.  Pt declined to attempt amb.     Nathanial Rancher 04/28/2014, 2:20 PM

## 2014-04-28 NOTE — Progress Notes (Signed)
Patient ID: [name], female   DOB: 1945-12-16, 69 y.o.   MRN: [id number]     [facility]      [address] [place], [place] [address]    Phone: [phone] FAX: [phone]     Subjective: More purulent drainage from drain.  VSS.  Did not walk yesterday.  4 bouts of diarrhea.  Drain #1 47m Drain #2 423m  Objective:  Vital signs:  Filed Vitals:   04/27/14 1336 04/27/14 2128 04/28/14 0430 04/28/14 0517  BP: 156/73 143/63  138/64  Pulse: 91 94  87  Temp: 98 F (36.7 C) 98.2 F (36.8 C)  98.3 F (36.8 C)  TempSrc: Oral Oral  Oral  Resp: 18 18  18   Height:      Weight:   98.7 kg (217 lb 9.5 oz)   SpO2: 96% 99%  97%    Last BM Date: 04/28/14  Intake/Output   Yesterday:  03/29 0701 - 03/30 0700 In: 990 [P.O.:780; I.V.:60] Out: 1293 [Urine:1250; Drains:43] This shift: I/O last 3 completed shifts: In: 142641P.O.:1260; I.V.:60; Other:170] Out: 185830Urine:1750; Drains:68]     Physical Exam: General: Pt awake/alert/oriented x4 in no acute distress Chest: clear. No chest wall pain w good excursion CV: Pulses intact. Regular rhythm MS: Normal AROM mjr joints. No obvious deformity Abdomen: Soft. Nondistended. Mildly tender at incisions only. Drain #1 purulent output. Drain #2--serous output. Incisions are c/d/i No evidence of peritonitis. No incarcerated hernias. Ext: SCDs BLE. No mjr edema. 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 Active Problems:   Carcinoma of tail of pancreas   Abscess of abdominal cavity    Results:   Labs: Results for orders placed or performed during the hospital encounter of 04/08/14 (from the past 48 hour(s))  Renal function panel     Status: Abnormal   Collection Time: 04/27/14  4:55 AM  Result Value Ref Range   Sodium 140 135 - 145  mmol/L   Potassium 3.3 (L) 3.5 - 5.1 mmol/L   Chloride 108 96 - 112 mmol/L   CO2 18 (L) 19 - 32 mmol/L   Glucose, Bld 81 70 - 99 mg/dL   BUN 21 6 - 23 mg/dL   Creatinine, Ser 3.54 (H) 0.50 - 1.10 mg/dL   Calcium 8.4 8.4 - 10.5 mg/dL   Phosphorus 4.6 2.3 - 4.6 mg/dL   Albumin 3.3 (L) 3.5 - 5.2 g/dL   GFR calc non Af Amer 12 (L) >90 mL/min   GFR calc Af Amer 14 (L) >90 mL/min    Comment: (NOTE) The eGFR has been calculated using the CKD EPI equation. This calculation has not been validated in all clinical situations. eGFR's persistently <90 mL/min signify possible Chronic Kidney Disease.    Anion gap 14 5 - 15  Low molecular wgt heparin (fractionated)     Status: None   Collection Time: 04/27/14 10:00 AM  Result Value Ref Range   Heparin LMW 0.96 IU/mL    Comment:        THERAPEUTIC RANGE: DVT,PE,ACS on LMWH 1 mg/kg q12 at 4 hrs = 0.5-1.2 units/mL. DVT,PE on LMWH 1.5 mg/kg q24 at 4 hrs = 1-2 units/mL.   Renal function panel     Status: Abnormal   Collection Time: 04/28/14  4:13 AM  Result Value Ref Range  Sodium 140 135 - 145 mmol/L   Potassium 3.2 (L) 3.5 - 5.1 mmol/L   Chloride 109 96 - 112 mmol/L   CO2 21 19 - 32 mmol/L   Glucose, Bld 91 70 - 99 mg/dL   BUN 18 6 - 23 mg/dL   Creatinine, Ser 2.97 (H) 0.50 - 1.10 mg/dL   Calcium 8.6 8.4 - 10.5 mg/dL   Phosphorus 4.5 2.3 - 4.6 mg/dL   Albumin 3.1 (L) 3.5 - 5.2 g/dL   GFR calc non Af Amer 15 (L) >90 mL/min   GFR calc Af Amer 18 (L) >90 mL/min    Comment: (NOTE) The eGFR has been calculated using the CKD EPI equation. This calculation has not been validated in all clinical situations. eGFR's persistently <90 mL/min signify possible Chronic Kidney Disease.    Anion gap 10 5 - 15  CBC     Status: Abnormal   Collection Time: 04/28/14  4:13 AM  Result Value Ref Range   WBC 9.0 4.0 - 10.5 K/uL   RBC 2.72 (L) 3.87 - 5.11 MIL/uL   Hemoglobin 7.9 (L) 12.0 - 15.0 g/dL   HCT 25.2 (L) 36.0 - 46.0 %   MCV 92.6 78.0 - 100.0  fL   MCH 29.0 26.0 - 34.0 pg   MCHC 31.3 30.0 - 36.0 g/dL   RDW 14.9 11.5 - 15.5 %   Platelets 448 (H) 150 - 400 K/uL    Imaging / Studies: No results found.  Medications / Allergies:  Scheduled Meds: . acetaminophen  650 mg Oral Q6H  . atorvastatin  40 mg Oral q1800  . brimonidine  1 drop Left Eye BID   And  . timolol  1 drop Left Eye BID  . ciprofloxacin  400 mg Intravenous Q24H  . darbepoetin (ARANESP) injection - NON-DIALYSIS  100 mcg Subcutaneous Q Tue-1800  . enoxaparin (LOVENOX) injection  80 mg Subcutaneous Q24H  . feeding supplement (RESOURCE BREEZE)  1 Container Oral TID BM  . guaiFENesin  600 mg Oral BID  . latanoprost  1 drop Both Eyes QHS  . pantoprazole  40 mg Oral Daily  . primidone  50 mg Oral QHS  . sodium chloride  10-40 mL Intracatheter Q12H  . tamsulosin  0.4 mg Oral Daily  . traMADol  50 mg Oral 4 times per day   Continuous Infusions:  PRN Meds:.albuterol, alum & mag hydroxide-simeth, lip balm, ondansetron **OR** ondansetron (ZOFRAN) IV, sodium chloride  Antibiotics: Anti-infectives    Start     Dose/Rate Route Frequency Ordered Stop   04/27/14 1000  ciprofloxacin (CIPRO) IVPB 400 mg     400 mg 200 mL/hr over 60 Minutes Intravenous Every 24 hours 04/26/14 1038     04/21/14 1000  ciprofloxacin (CIPRO) tablet 500 mg  Status:  Discontinued     500 mg Oral Every 24 hours 04/21/14 0849 04/26/14 1038   04/20/14 1400  piperacillin-tazobactam (ZOSYN) IVPB 2.25 g  Status:  Discontinued     2.25 g 100 mL/hr over 30 Minutes Intravenous Every 6 hours 04/20/14 0713 04/21/14 0849   04/15/14 1800  vancomycin (VANCOCIN) 1,250 mg in sodium chloride 0.9 % 250 mL IVPB  Status:  Discontinued     1,250 mg 166.7 mL/hr over 90 Minutes Intravenous Every 12 hours 04/15/14 0801 04/17/14 0643   04/15/14 1500  piperacillin-tazobactam (ZOSYN) IVPB 3.375 g  Status:  Discontinued     3.375 g 12.5 mL/hr over 240 Minutes Intravenous 3 times per day 04/15/14 0801 04/20/14  1316    04/15/14 0900  vancomycin (VANCOCIN) 1,500 mg in sodium chloride 0.9 % 500 mL IVPB     1,500 mg 250 mL/hr over 120 Minutes Intravenous  Once 04/15/14 0801 04/15/14 1231   04/15/14 0830  piperacillin-tazobactam (ZOSYN) IVPB 3.375 g     3.375 g 100 mL/hr over 30 Minutes Intravenous  Once 04/15/14 0801 04/15/14 0928   04/08/14 1600  cefoTEtan (CEFOTAN) 2 g in dextrose 5 % 50 mL IVPB     2 g 100 mL/hr over 30 Minutes Intravenous Every 12 hours 04/08/14 1522 04/09/14 0530   04/08/14 0537  ceFAZolin (ANCEF) IVPB 2 g/50 mL premix     2 g 100 mL/hr over 30 Minutes Intravenous On call to O.R. 04/08/14 0537 04/08/14 0759     Assessment/Plan Carcinoma at the tail of the pancreas POD#20 robotic assisted converted to laparoscopic hand assisted distal pancreatectomy, splenectomy with partial colectomy---Dr. Barry Dienes -having diarrhea, nausea and vomiting, C diff negative, little relief from zofran.  Last CT of abdomen on 3/26.  I will resume her Protonix to see if it helps.  Not sure the etiology.  ? Does she need a repeat CT  -give fulls, supplements -SCD/lovenox -mobilize, needs to increase her activity, PT eval and treat -tramadol and tylenol only for pain d/t lethargy/myoclonus  LUQ subphrenic abscess -continue with drains -c/w cipro IV D#7(?cause of nausea) -culture +pseudomonas Urinary retention -continue with foley for accurate I&Os -flomax -will start voiding trial tomorrow Tremors  -primidone Acute renal failure---non oliguric ATN -appreciate renal assistance -improving  DVT/PE -lovenox per pharmacy, has an IVC filter Anemia -hgb 7.9, monitor Dispo--continue inpatient, await resolution N/V.  Renal function is better, can be followed on OP basis per nephrology   Erby Pian, [facility] Surgery Pager [phone](7A-4:30P)   04/28/2014 11:07 AM

## 2014-04-29 ENCOUNTER — Inpatient Hospital Stay (HOSPITAL_COMMUNITY): Payer: Medicare Other

## 2014-04-29 ENCOUNTER — Encounter (HOSPITAL_COMMUNITY): Payer: Self-pay | Admitting: Radiology

## 2014-04-29 LAB — URINALYSIS, ROUTINE W REFLEX MICROSCOPIC
BILIRUBIN URINE: NEGATIVE
Glucose, UA: NEGATIVE mg/dL
KETONES UR: 15 mg/dL — AB
Nitrite: NEGATIVE
PROTEIN: NEGATIVE mg/dL
Specific Gravity, Urine: 1.01 (ref 1.005–1.030)
Urobilinogen, UA: 0.2 mg/dL (ref 0.0–1.0)
pH: 6 (ref 5.0–8.0)

## 2014-04-29 LAB — URINE MICROSCOPIC-ADD ON

## 2014-04-29 LAB — RENAL FUNCTION PANEL
ALBUMIN: 3.4 g/dL — AB (ref 3.5–5.2)
Anion gap: 13 (ref 5–15)
BUN: 17 mg/dL (ref 6–23)
CALCIUM: 8.9 mg/dL (ref 8.4–10.5)
CO2: 20 mmol/L (ref 19–32)
CREATININE: 2.67 mg/dL — AB (ref 0.50–1.10)
Chloride: 109 mmol/L (ref 96–112)
GFR calc Af Amer: 20 mL/min — ABNORMAL LOW (ref >90)
GFR, EST NON AFRICAN AMERICAN: 17 mL/min — AB (ref >90)
Glucose, Bld: 93 mg/dL (ref 70–99)
PHOSPHORUS: 3.7 mg/dL (ref 2.3–4.6)
Potassium: 3.3 mmol/L — ABNORMAL LOW (ref 3.5–5.1)
Sodium: 142 mmol/L (ref 135–145)

## 2014-04-29 LAB — GLUCOSE, CAPILLARY
GLUCOSE-CAPILLARY: 100 mg/dL — AB (ref 70–99)
GLUCOSE-CAPILLARY: 121 mg/dL — AB (ref 70–99)
Glucose-Capillary: 85 mg/dL (ref 70–99)

## 2014-04-29 LAB — MAGNESIUM: Magnesium: 1.4 mg/dL — ABNORMAL LOW (ref 1.5–2.5)

## 2014-04-29 MED ORDER — PROMETHAZINE HCL 25 MG/ML IJ SOLN
12.5000 mg | Freq: Once | INTRAMUSCULAR | Status: AC
  Administered 2014-04-29: 12.5 mg via INTRAVENOUS
  Filled 2014-04-29: qty 1

## 2014-04-29 MED ORDER — MAGNESIUM SULFATE 2 GM/50ML IV SOLN
2.0000 g | Freq: Once | INTRAVENOUS | Status: AC
  Administered 2014-04-29: 2 g via INTRAVENOUS
  Filled 2014-04-29: qty 50

## 2014-04-29 MED ORDER — INSULIN ASPART 100 UNIT/ML ~~LOC~~ SOLN
0.0000 [IU] | Freq: Four times a day (QID) | SUBCUTANEOUS | Status: DC
  Administered 2014-04-30 (×4): 1 [IU] via SUBCUTANEOUS
  Administered 2014-05-01: 2 [IU] via SUBCUTANEOUS
  Administered 2014-05-01 (×2): 1 [IU] via SUBCUTANEOUS
  Administered 2014-05-01 – 2014-05-02 (×2): 2 [IU] via SUBCUTANEOUS
  Administered 2014-05-02 (×2): 1 [IU] via SUBCUTANEOUS

## 2014-04-29 MED ORDER — FAT EMULSION 20 % IV EMUL
240.0000 mL | INTRAVENOUS | Status: AC
  Administered 2014-04-29: 240 mL via INTRAVENOUS
  Filled 2014-04-29: qty 250

## 2014-04-29 MED ORDER — SUCRALFATE 1 GM/10ML PO SUSP
1.0000 g | Freq: Three times a day (TID) | ORAL | Status: DC
  Administered 2014-04-29 – 2014-05-04 (×20): 1 g via ORAL
  Filled 2014-04-29 (×24): qty 10

## 2014-04-29 MED ORDER — TRAMADOL HCL 50 MG PO TABS
100.0000 mg | ORAL_TABLET | Freq: Two times a day (BID) | ORAL | Status: DC | PRN
  Administered 2014-04-29 – 2014-05-02 (×3): 100 mg via ORAL
  Filled 2014-04-29 (×3): qty 2

## 2014-04-29 MED ORDER — TRACE MINERALS CR-CU-F-FE-I-MN-MO-SE-ZN IV SOLN
INTRAVENOUS | Status: AC
  Administered 2014-04-29: 17:00:00 via INTRAVENOUS
  Filled 2014-04-29: qty 960

## 2014-04-29 MED ORDER — POTASSIUM CHLORIDE 10 MEQ/100ML IV SOLN
10.0000 meq | INTRAVENOUS | Status: AC
  Administered 2014-04-29 (×3): 10 meq via INTRAVENOUS
  Filled 2014-04-29 (×4): qty 100

## 2014-04-29 MED ORDER — IOHEXOL 300 MG/ML  SOLN
25.0000 mL | INTRAMUSCULAR | Status: AC
  Administered 2014-04-29: 25 mL via ORAL

## 2014-04-29 NOTE — Progress Notes (Signed)
Calorie Count Note  48- hour calorie count ordered.  Diet: full liquid Supplements: Ensure Enlive po TID, each supplement provides 350 kcal and 20 grams of protein  3/30: Dinner: 10% of chicken broth Supplements: drank 20% of Ensure Enlive, but vomited it back up  Total intake: 0 kcal (0% of minimum estimated needs)  0 protein (0% of minimum estimated needs)  Pt very nauseated and has been vomiting. Unable to keep anything down.   Nutrition Dx: Inadequate oral intake related to poor appetite as evidenced by poor PO intake.  Goal: Pt to meet >/= 90% of their estimated nutrition needs   Intervention: Continue supplements and calorie count  Laurette Schimke [place], [place], [place]

## 2014-04-29 NOTE — Progress Notes (Signed)
Patient ID: [name], female   DOB: 09-06-1945, 69 y.o.   MRN: [id number]    Referring Physician(s): CCS  Subjective:  Pt with intermittent N/V, poor appetite; sitting up in chair; some soreness at LUQ drain site  Allergies: Sulfa antibiotics  Medications: Prior to Admission medications   Medication Sig Start Date End Date Taking? Authorizing Provider  alum & mag hydroxide-simeth (MAALOX PLUS) 400-400-40 MG/5ML suspension Take 30 mLs by mouth every 6 (six) hours as needed for indigestion.   Yes Historical Provider, MD  atorvastatin (LIPITOR) 40 MG tablet TAKE 1 TABLET BY MOUTH DAILY 03/28/14  Yes Mancel Bale, PA-C  brimonidine-timolol (COMBIGAN) 0.2-0.5 % ophthalmic solution Place 1 drop into the left eye every 12 (twelve) hours.   Yes Historical Provider, MD  CARTIA XT 120 MG 24 hr capsule TAKE ONE CAPSULE BY MOUTH DAILY Patient taking differently: TAKE ONE CAPSULE BY MOUTH DAILY IN THE EVENING 03/28/14  Yes Mancel Bale, PA-C  enoxaparin (LOVENOX) 100 MG/ML injection Inject 1 mL (100 mg total) into the skin every 12 (twelve) hours. 02/16/14  Yes Truitt Merle, MD  gabapentin (NEURONTIN) 300 MG capsule TAKE ONE CAPSULE BY MOUTH AT BEDTIME; AFTER 7 DAYS, MAY INCREASE TO 2 CAPSULES AT BEDTIME 03/28/14  Yes Mancel Bale, PA-C  HYDROcodone-acetaminophen (NORCO/VICODIN) 5-325 MG per tablet Take 1 tablet by mouth every 6 (six) hours as needed for moderate pain. Patient taking differently: Take 2 tablets by mouth every 4 (four) hours as needed for moderate pain.  12/31/13  Yes Wardell Honour, MD  latanoprost (XALATAN) 0.005 % ophthalmic solution Place 1 drop into both eyes at bedtime.   Yes Historical Provider, MD  pantoprazole (PROTONIX) 40 MG tablet TAKE 1 TABLET BY MOUTH DAILY 03/28/14  Yes Leandrew Koyanagi, MD  primidone (MYSOLINE) 50 MG tablet Take 50 mg by mouth daily. Patient takes at nite   Yes Historical Provider, MD  propranolol ER (INDERAL LA) 120 MG 24 hr capsule TAKE ONE CAPSULE  BY MOUTH DAILY Patient taking differently: TAKE ONE CAPSULE BY MOUTH DAILY AT BEDTIME 03/28/14  Yes Mancel Bale, PA-C  acetaminophen (TYLENOL) 500 MG tablet Take 500 mg by mouth every 6 (six) hours as needed (Pain).    Historical Provider, MD  doxycycline (VIBRA-TABS) 100 MG tablet Take 1 tablet (100 mg total) by mouth 2 (two) times daily. 03/19/14   Golden Circle, FNP  traMADol (ULTRAM) 50 MG tablet Take 1 tablet (50 mg total) by mouth every 8 (eight) hours as needed. Patient not taking: Reported on 03/29/2014 03/19/14   Golden Circle, FNP     Vital Signs: BP 147/67 mmHg  Pulse 97  Temp(Src) 98.1 F (36.7 C) (Oral)  Resp 18  Ht 5' 9.5" (1.765 m)  Wt 211 lb 3.2 oz (95.8 kg)  BMI 30.75 kg/m2  SpO2 98%  Physical Exam pt awake/alert; LUQ drain intact, old blood at insertion site and small amt bloody fluid in JP bulb; clot in tubing; 50 cc's output yesterday  Imaging: No results found.  Labs:  CBC:  Recent Labs  04/23/14 0535 04/24/14 0400 04/25/14 0436 04/28/14 0413  WBC 9.1 9.1 9.4 9.0  HGB 8.4* 8.6* 7.9* 7.9*  HCT 26.4* 26.5* 24.7* 25.2*  PLT 552* 549* 537* 448*    COAGS:  Recent Labs  04/01/14 1400 04/08/14 0630 04/09/14 0404  INR 1.11 1.06 1.12  APTT  --  40* 33    BMP:  Recent Labs  04/26/14 0321 04/27/14  0455 04/28/14 0413 04/29/14 0420  NA 138 140 140 142  K 3.5 3.3* 3.2* 3.3*  CL 109 108 109 109  CO2 20 18* 21 20  GLUCOSE 81 81 91 93  BUN 22 21 18 17   CALCIUM 8.4 8.4 8.6 8.9  CREATININE 3.61* 3.54* 2.97* 2.67*  GFRNONAA 12* 12* 15* 17*  GFRAA 14* 14* 18* 20*    LIVER FUNCTION TESTS:  Recent Labs  01/04/14 1107 02/16/14 1443 04/01/14 1400 04/24/14 2039  04/26/14 0321 04/27/14 0455 04/28/14 0413 04/29/14 0420  BILITOT 0.5 0.42 0.7 0.5  --   --   --   --   --   AST 29 24 35 30  --   --   --   --   --   ALT 10 10 21 13   --   --   --   --   --   ALKPHOS 80 96 75 83  --   --   --   --   --   PROT 6.8 7.2 7.6 6.1  --   --   --    --   --   ALBUMIN 3.6 3.6 3.9 2.4*  < > 3.3* 3.3* 3.1* 3.4*  < > = values in this interval not displayed.  Assessment and Plan: S/p LUQ abscess drainage 3/18 (post distal pancreatectomy/splenectomy/partial colectomy 04/08/14 for panc ca); renal fxn improving; for repeat CT A/P today   Signed: ALLRED,D [facility] 04/29/2014, 1:34 PM   I spent a total of 15 minutes in face to face in clinical consultation/evaluation, greater than 50% of which was counseling/coordinating care for LUQ abscess drain

## 2014-04-29 NOTE — Progress Notes (Signed)
Dr. Marcello Moores aware on rounds to unit pt not voided since foley out at 1030 this am. Recently bladder scanned with 450 ml noted. Md advised to give pt a little more time to spontaneously void. Order received to I&O cath q8h if unable to void.

## 2014-04-29 NOTE — Progress Notes (Signed)
PT Cancellation Note  Patient Details Name: [name] MRN: [id number] DOB: 09/30/1945   Cancelled Treatment:      pt just recently got back from CT and requested I come back tomorrow.  Nathanial Rancher 04/29/2014, 4:04 PM

## 2014-04-29 NOTE — Progress Notes (Signed)
NUTRITION FOLLOW-UP  DOCUMENTATION CODES Per approved criteria  -Non-severe (moderate) malnutrition in the context of acute illness or injury -Obesity, unspecified  Pt meets criteria for moderate MALNUTRITION in the context of acute illness as evidenced by energy intake of <75% for > 7 days and mild fluid accumulation.  INTERVENTION: - TPN per pharmacy - Calorie Count ongoing - Ensure Enlive po BID, each supplement provides 350 kcal and 20 grams of protein - RD will continue to monitor  NUTRITION DIAGNOSIS: Inadequate oral intake related to poor appetite as evidenced by poor PO intake; ongoing  Goal: Pt to meet >/= 90% of their estimated nutrition needs; not met   Monitor:  PO and supplemental intake, weight, labs, I/O's, TPN initiation and tolerance  Reason for Assessment: Consult for New TPN  Admitting Dx: Primary cancer of tail of pancreas  ASSESSMENT: 69 year old female who presents with pancreatic cancer. S/p Procedure(s): XI ROBOTIC ASSISTED LAPAROSCOPIC DISTAL PANCREATECTOMY/SPLENECTOMY   3/29 Pt in room with nurse tech. Pt reports eating well PTA. Pt did develop diarrhea around 3/17. Pt's appetite has been poor. Pt had a bite of pudding and a sip of tomato soup for lunch and states she did not tolerate them well.   3/31: - Consult for new TPN until pt can better tolerate po. - Diarrhea improved. - Pt current has ongoing Calorie Count with no PO intake. She has been having nausea/vomiting and been unable to keep anything down.  - RD will continue to monitor  Height: Ht Readings from Last 1 Encounters:  04/08/14 5' 9.5" (1.765 m)    Weight: Wt Readings from Last 1 Encounters:  04/29/14 211 lb 3.2 oz (95.8 kg)    Ideal Body Weight: 145 lb  % Ideal Body Weight: 150%  Wt Readings from Last 10 Encounters:  04/29/14 211 lb 3.2 oz (95.8 kg)  04/01/14 202 lb (91.627 kg)  03/22/14 210 lb 6.4 oz (95.437 kg)  03/19/14 212 lb 12.8 oz (96.525 kg)  02/26/14 216 lb  (97.977 kg)  02/16/14 216 lb 12.8 oz (98.34 kg)  01/04/14 218 lb (98.884 kg)  12/31/13 212 lb 9.6 oz (96.435 kg)  12/21/13 211 lb (95.709 kg)  12/11/13 211 lb 6.4 oz (95.89 kg)    Usual Body Weight: 220 lb -per pt  % Usual Body Weight: 99%  BMI:  Body mass index is 30.75 kg/(m^2).  Estimated Nutritional Needs: Kcal: 1800-2000 Protein: 120-130 g Fluid: 1.8 L/day  Skin: abdominal incision  Diet Order: Diet full liquid Room service appropriate?: Yes; Fluid consistency:: Thin  EDUCATION NEEDS: -No education needs identified at this time   Intake/Output Summary (Last 24 hours) at 04/29/14 1414 Last data filed at 04/29/14 1349  Gross per 24 hour  Intake   1010 ml  Output   1666 ml  Net   -656 ml    Last BM: 3/30  Labs:   Recent Labs Lab 04/27/14 0455 04/28/14 0413 04/29/14 0420  NA 140 140 142  K 3.3* 3.2* 3.3*  CL 108 109 109  CO2 18* 21 20  BUN _0 CREATININE 3.54* 2.97* 2.67*  CALCIUM 8.4 8.6 8.9  PHOS 4.6 4.5 3.7  GLUCOSE 81 91 93    CBG (last 3)  No results for input(s): GLUCAP in the last 72 hours.  Scheduled Meds: . acetaminophen  650 mg Oral Q6H  . atorvastatin  40 mg Oral q1800  . brimonidine  1 drop Left Eye BID   And  . timolol  1 drop Left Eye BID  . ciprofloxacin  400 mg Intravenous Q24H  . darbepoetin (ARANESP) injection - NON-DIALYSIS  100 mcg Subcutaneous Q Tue-1800  . enoxaparin (LOVENOX) injection  80 mg Subcutaneous Q24H  . feeding supplement (ENSURE ENLIVE)  237 mL Oral TID BM  . latanoprost  1 drop Both Eyes QHS  . pantoprazole  40 mg Oral Daily  . potassium chloride  40 mEq Oral Daily  . primidone  50 mg Oral QHS  . sodium chloride  10-40 mL Intracatheter Q12H  . sucralfate  1 g Oral TID WC & HS  . tamsulosin  0.4 mg Oral Daily    Continuous Infusions:   Past Medical History  Diagnosis Date  . Glaucoma   . Clotting disorder   . DVT (deep venous thrombosis)     LEFT  . GERD (gastroesophageal reflux disease)    . Hypercholesterolemia   . Peripheral neuropathy   . Essential tremor   . Diverticulosis   . Arthritis     knees and back  . Cancer     Pancreatic  . Hypertension   . Colon polyps   . UTI (lower urinary tract infection)   . Complication of anesthesia     slow to wak up   . Pulmonary embolism     Past Surgical History  Procedure Laterality Date  . Cholecystectomy    . Abdominal hysterectomy    . Knee surgery Bilateral   . [place] filter    . Back surgery    . Eus N/A 02/26/2014    Procedure: UPPER ENDOSCOPIC ULTRASOUND (EUS) LINEAR;  Surgeon: Beryle Beams, MD;  Location: [facility];  Service: Endoscopy;  Laterality: N/A;  . Fine needle aspiration N/A 02/26/2014    Procedure: FINE NEEDLE ASPIRATION (FNA) LINEAR;  Surgeon: Beryle Beams, MD;  Location: [facility];  Service: Endoscopy;  Laterality: N/A;  . Breast lumpectomy Right   . Spine surgery    . Xi robotic assisted laparoscopic distal pancreatectomy N/A 04/08/2014    Procedure: XI ROBOTIC ASSISTED CONVERTED LAPAROSCOPIC HAND ASSITED DISTAL PANCREATECTOMY/SPLENECTOMY WITH PARTIAL COLECTOMY;  Surgeon: [name], MD;  Location: [facility];  Service: General;  Laterality: N/A;    Laurette Schimke [place], [place], [place]

## 2014-04-29 NOTE — Progress Notes (Signed)
Patient ID: [name], female   DOB: 15-Sep-1945, 69 y.o.   MRN: [id number]     [facility]      [address] [place], [place] [address]    Phone: [phone] FAX: [phone]     Subjective: Diarrhea better.  Continues to have n/v. VSS.  Afebrile.  Good uop.  Improved renal function.  Poor appetite.  Objective:  Vital signs:  Filed Vitals:   04/28/14 1404 04/28/14 2126 04/29/14 0317 04/29/14 0545  BP: 135/61 146/67  147/67  Pulse: 82 90  97  Temp: 98.8 F (37.1 C) 98.4 F (36.9 C)  98.1 F (36.7 C)  TempSrc: Oral Oral  Oral  Resp: _0 Height:      Weight:   95.8 kg (211 lb 3.2 oz)   SpO2: 100% 100%  98%    Last BM Date: 04/28/14  Intake/Output   Yesterday:  03/30 0701 - 03/31 0700 In: 450 [P.O.:420] Out: 1666 [Urine:1600; Drains:65; Stool:1] This shift:  Total I/O In: 60 [P.O.:60] Out: 300 [Urine:300]   Physical Exam: General: Pt awake/alert/oriented x4 in no acute distress Chest: clear. No chest wall pain w good excursion CV: Pulses intact. Regular rhythm MS: Normal AROM mjr joints. No obvious deformity Abdomen: Soft. Nondistended. Mildly tender at incisions only. Drain #1 purulent output. Drain #2--serous output. Incisions are c/d/i No evidence of peritonitis. No incarcerated hernias. Ext: SCDs BLE. No mjr edema. 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 DVT,PE on LMWH 1.5 mg/kg q24 at 4 hrs = 1-2 units/mL.   Renal function panel     Status: Abnormal   Collection Time: 04/28/14  4:13 AM  Result Value Ref Range   Sodium 140 135 - 145 mmol/L   Potassium 3.2 (L) 3.5 - 5.1 mmol/L   Chloride 109 96 - 112 mmol/L   CO2 21 19 - 32 mmol/L   Glucose, Bld 91 70 - 99 mg/dL   BUN 18 6 - 23 mg/dL   Creatinine, Ser 2.97 (H) 0.50 - 1.10 mg/dL   Calcium 8.6 8.4 - 10.5 mg/dL   Phosphorus 4.5 2.3 - 4.6 mg/dL   Albumin 3.1 (L) 3.5 - 5.2 g/dL   GFR calc non Af Amer 15 (L) >90 mL/min   GFR calc Af Amer 18 (L) >90 mL/min    Comment: (NOTE) The eGFR has been calculated using the CKD EPI equation. This calculation has not been validated in all clinical situations. eGFR's persistently <90 mL/min signify possible Chronic Kidney Disease.    Anion gap 10 5 - 15  CBC     Status: Abnormal   Collection Time: 04/28/14  4:13 AM  Result Value Ref Range   WBC 9.0 4.0 - 10.5 K/uL  RBC 2.72 (L) 3.87 - 5.11 MIL/uL   Hemoglobin 7.9 (L) 12.0 - 15.0 g/dL   HCT 25.2 (L) 36.0 - 46.0 %   MCV 92.6 78.0 - 100.0 fL   MCH 29.0 26.0 - 34.0 pg   MCHC 31.3 30.0 - 36.0 g/dL   RDW 14.9 11.5 - 15.5 %   Platelets 448 (H) 150 - 400 K/uL  Renal function panel     Status: Abnormal   Collection Time: 04/29/14  4:20 AM  Result Value Ref Range   Sodium 142 135 - 145 mmol/L   Potassium 3.3 (L) 3.5 - 5.1 mmol/L   Chloride 109 96 - 112 mmol/L   CO2 20 19 - 32 mmol/L   Glucose, Bld 93 70 - 99 mg/dL   BUN 17 6 - 23 mg/dL   Creatinine, Ser 2.67 (H) 0.50 - 1.10 mg/dL   Calcium 8.9 8.4 - 10.5 mg/dL   Phosphorus 3.7 2.3 - 4.6 mg/dL   Albumin 3.4 (L) 3.5 - 5.2 g/dL   GFR calc non Af Amer 17 (L) >90 mL/min   GFR calc Af Amer 20 (L) >90 mL/min    Comment: (NOTE) The eGFR has been calculated using the CKD EPI equation. This calculation has not been validated in all clinical  situations. eGFR's persistently <90 mL/min signify possible Chronic Kidney Disease.    Anion gap 13 5 - 15    Imaging / Studies: No results found.  Medications / Allergies:  Scheduled Meds: . acetaminophen  650 mg Oral Q6H  . atorvastatin  40 mg Oral q1800  . brimonidine  1 drop Left Eye BID   And  . timolol  1 drop Left Eye BID  . ciprofloxacin  400 mg Intravenous Q24H  . darbepoetin (ARANESP) injection - NON-DIALYSIS  100 mcg Subcutaneous Q Tue-1800  . enoxaparin (LOVENOX) injection  80 mg Subcutaneous Q24H  . feeding supplement (ENSURE ENLIVE)  237 mL Oral TID BM  . guaiFENesin  600 mg Oral BID  . latanoprost  1 drop Both Eyes QHS  . pantoprazole  40 mg Oral Daily  . potassium chloride  40 mEq Oral Daily  . primidone  50 mg Oral QHS  . sodium chloride  10-40 mL Intracatheter Q12H  . tamsulosin  0.4 mg Oral Daily  . traMADol  50 mg Oral 4 times per day   Continuous Infusions:  PRN Meds:.albuterol, alum & mag hydroxide-simeth, lip balm, ondansetron **OR** ondansetron (ZOFRAN) IV, sodium chloride  Antibiotics: Anti-infectives    Start     Dose/Rate Route Frequency Ordered Stop   04/27/14 1000  ciprofloxacin (CIPRO) IVPB 400 mg     400 mg 200 mL/hr over 60 Minutes Intravenous Every 24 hours 04/26/14 1038     04/21/14 1000  ciprofloxacin (CIPRO) tablet 500 mg  Status:  Discontinued     500 mg Oral Every 24 hours 04/21/14 0849 04/26/14 1038   04/20/14 1400  piperacillin-tazobactam (ZOSYN) IVPB 2.25 g  Status:  Discontinued     2.25 g 100 mL/hr over 30 Minutes Intravenous Every 6 hours 04/20/14 0713 04/21/14 0849   04/15/14 1800  vancomycin (VANCOCIN) 1,250 mg in sodium chloride 0.9 % 250 mL IVPB  Status:  Discontinued     1,250 mg 166.7 mL/hr over 90 Minutes Intravenous Every 12 hours 04/15/14 0801 04/17/14 0643   04/15/14 1500  piperacillin-tazobactam (ZOSYN) IVPB 3.375 g  Status:  Discontinued     3.375 g 12.5 mL/hr over 240 Minutes Intravenous 3 times per day  04/15/14  0801 04/20/14 1316   04/15/14 0900  vancomycin (VANCOCIN) 1,500 mg in sodium chloride 0.9 % 500 mL IVPB     1,500 mg 250 mL/hr over 120 Minutes Intravenous  Once 04/15/14 0801 04/15/14 1231   04/15/14 0830  piperacillin-tazobactam (ZOSYN) IVPB 3.375 g     3.375 g 100 mL/hr over 30 Minutes Intravenous  Once 04/15/14 0801 04/15/14 0928   04/08/14 1600  cefoTEtan (CEFOTAN) 2 g in dextrose 5 % 50 mL IVPB     2 g 100 mL/hr over 30 Minutes Intravenous Every 12 hours 04/08/14 1522 04/09/14 0530   04/08/14 0537  ceFAZolin (ANCEF) IVPB 2 g/50 mL premix     2 g 100 mL/hr over 30 Minutes Intravenous On call to O.R. 04/08/14 0721 04/08/14 0759       Assessment/Plan Carcinoma at the tail of the pancreas POD#21 robotic assisted converted to laparoscopic hand assisted distal pancreatectomy, splenectomy with partial colectomy---Dr. Barry Dienes -diarrhea has decreased, n/v, poor appetite, taking in water.   Last CT of abdomen on 3/26. Repeat CT today.  Change tramadol to PRN -give fulls, ensure supplements, 48h calorie count.  If inadequate which I suspect will be, may need TPN -SCD/lovenox -mobilize, needs to increase her activity, PT eval and treat LUQ subphrenic abscess -continue with drains -c/w cipro IV D#8(?cause of nausea) -culture +pseudomonas Hypokalemia -38mq K today, repeat BMP in AM Urinary retention -DC foley -flomax Tremors  -primidone Acute renal failure---non oliguric ATN -renal following at distance, appreciate assistance -improving  DVT/PE -lovenox per pharmacy, has an IVC filter Anemia -hgb 7.9, monitor Dispo--continue inpatient, await resolution N/V. Renal function is better, can be followed on OP basis per nephrology  EErby Pian [facility] Pager [phone]([id number])   04/29/2014 9:35 AM

## 2014-04-29 NOTE — Progress Notes (Addendum)
PARENTERAL NUTRITION CONSULT NOTE - INITIAL  Pharmacy Consult for TPN Indication: not tolerating enteral feeding  Allergies  Allergen Reactions  . Sulfa Antibiotics Swelling    Patient Measurements: Height: 5' 9.5" (176.5 cm) Weight: 211 lb 3.2 oz (95.8 kg) IBW/kg (Calculated) : 67.35 Adjusted Body Weight: 74 kg  Vital Signs: Temp: 98.1 F (36.7 C) (03/31 0545) Temp Source: Oral (03/31 0545) BP: 147/67 mmHg (03/31 0545) Pulse Rate: 97 (03/31 0545) Intake/Output from previous day: 03/30 0701 - 03/31 0700 In: 450 [P.O.:420] Out: 1666 [Urine:1600; Drains:65; Stool:1] Intake/Output from this shift: Total I/O In: 80 [P.O.:60; I.V.:20] Out: 300 [Urine:300]  Labs:  Recent Labs  04/28/14 0413  WBC 9.0  HGB 7.9*  HCT 25.2*  PLT 448*     Recent Labs  04/27/14 0455 04/28/14 0413 04/29/14 0420  NA 140 140 142  K 3.3* 3.2* 3.3*  CL 108 109 109  CO2 18* 21 20  GLUCOSE 81 91 93  BUN 21 18 17   CREATININE 3.54* 2.97* 2.67*  CALCIUM 8.4 8.6 8.9  PHOS 4.6 4.5 3.7  ALBUMIN 3.3* 3.1* 3.4*   Estimated Creatinine Clearance: 25.4 mL/min (by C-G formula based on Cr of 2.67).   No results for input(s): GLUCAP in the last 72 hours.  Medical History: Past Medical History  Diagnosis Date  . Glaucoma   . Clotting disorder   . DVT (deep venous thrombosis)     LEFT  . GERD (gastroesophageal reflux disease)   . Hypercholesterolemia   . Peripheral neuropathy   . Essential tremor   . Diverticulosis   . Arthritis     knees and back  . Cancer     Pancreatic  . Hypertension   . Colon polyps   . UTI (lower urinary tract infection)   . Complication of anesthesia     slow to wak up   . Pulmonary embolism     Insulin Requirements: none  Current Nutrition: ensure 237 TID (last dose given 3/30)  IVF: none  Central access: PICC placed 04/18/14 TPN start date: 04/29/14  ASSESSMENT                                                                                                           Patient is a 69 y.o F with pancreatic cancer s/p pancreatectomy and splenectomy, partial colectomy on 3/10.  He has poor oral intake with resource/ensure started for on 3/29.  He now has n/v and not able to keep anything down.  To start TPN while patient has poor oral intake.  HPI: pancreatic cancer, DVT, PE, HLP, GERD, HTN  Significant events:  3/10: distal pancreatectomy and splenectomy, partial colectomy 3/18: drainage of L upper abd fluid collection/abscesses with perc drain pacement  Today:    Glucose (cbgs goal<150): 90s  Electrolytes: K 3.3 (s/p 40 mEq PO x1 on 3/30), phos 3.7, Na wnl  Renal: AKI, scr trending down 2.67, UOP 0.7  LFTs: wnl (3/26)  TGs: pending  Prealbumin: pending, albumin 3.4  NUTRITIONAL GOALS  RD recs:pending  Estimated protein needs 89-110 gm/day and 1860-2220 kcal/day  Clinimix E 5/15 at a goal rate of 83 ml/hr + 20% fat emulsion at 10 ml/hr to provide: 100 g/day protein, 1894 Kcal/day.  ++ Will adjust TPN goal rate based on RD's recommendations++  PLAN                                                                                                                         1) Magnesium now 2) At 1800 today:  Start Clinimix E 5/15 at 93ml/hr.  20% fat emulsion at 10 ml/hr.  10 mEq KCL x4 runs per renal (Dr. Florene Glen)  Plan to advance as tolerated to the goal rate.  TPN to contain standard multivitamins and trace elements.  Start sensitive SSI q6h .   TPN lab panels on Mondays & Thursdays.  F/u daily.  Dia Sitter P 04/29/2014,1:08 PM  Adden (3/31 at 1538): Mag 1.4 -- will supplement 2gm magnesium sulfate x1 today Dia Sitter, PharmD, BCPS

## 2014-04-30 LAB — COMPREHENSIVE METABOLIC PANEL
ALT: 10 U/L (ref 0–35)
ANION GAP: 7 (ref 5–15)
AST: 29 U/L (ref 0–37)
Albumin: 3 g/dL — ABNORMAL LOW (ref 3.5–5.2)
Alkaline Phosphatase: 65 U/L (ref 39–117)
BILIRUBIN TOTAL: 0.3 mg/dL (ref 0.3–1.2)
BUN: 14 mg/dL (ref 6–23)
CHLORIDE: 108 mmol/L (ref 96–112)
CO2: 24 mmol/L (ref 19–32)
CREATININE: 2.23 mg/dL — AB (ref 0.50–1.10)
Calcium: 8.6 mg/dL (ref 8.4–10.5)
GFR calc Af Amer: 25 mL/min — ABNORMAL LOW (ref >90)
GFR calc non Af Amer: 22 mL/min — ABNORMAL LOW (ref >90)
GLUCOSE: 127 mg/dL — AB (ref 70–99)
POTASSIUM: 3 mmol/L — AB (ref 3.5–5.1)
Sodium: 139 mmol/L (ref 135–145)
Total Protein: 6 g/dL (ref 6.0–8.3)

## 2014-04-30 LAB — DIFFERENTIAL
Basophils Absolute: 0.1 10*3/uL (ref 0.0–0.1)
Basophils Relative: 1 % (ref 0–1)
EOS PCT: 3 % (ref 0–5)
Eosinophils Absolute: 0.3 10*3/uL (ref 0.0–0.7)
LYMPHS PCT: 20 % (ref 12–46)
Lymphs Abs: 2 10*3/uL (ref 0.7–4.0)
Monocytes Absolute: 1.4 10*3/uL — ABNORMAL HIGH (ref 0.1–1.0)
Monocytes Relative: 13 % — ABNORMAL HIGH (ref 3–12)
Neutro Abs: 6.6 10*3/uL (ref 1.7–7.7)
Neutrophils Relative %: 63 % (ref 43–77)

## 2014-04-30 LAB — GLUCOSE, CAPILLARY
GLUCOSE-CAPILLARY: 125 mg/dL — AB (ref 70–99)
GLUCOSE-CAPILLARY: 124 mg/dL — AB (ref 70–99)
GLUCOSE-CAPILLARY: 128 mg/dL — AB (ref 70–99)

## 2014-04-30 LAB — TRIGLYCERIDES: Triglycerides: 82 mg/dL (ref <150)

## 2014-04-30 LAB — MAGNESIUM: Magnesium: 1.8 mg/dL (ref 1.5–2.5)

## 2014-04-30 LAB — CBC
HCT: 24.7 % — ABNORMAL LOW (ref 36.0–46.0)
Hemoglobin: 7.9 g/dL — ABNORMAL LOW (ref 12.0–15.0)
MCH: 29.6 pg (ref 26.0–34.0)
MCHC: 32 g/dL (ref 30.0–36.0)
MCV: 92.5 fL (ref 78.0–100.0)
PLATELETS: 462 10*3/uL — AB (ref 150–400)
RBC: 2.67 MIL/uL — ABNORMAL LOW (ref 3.87–5.11)
RDW: 15.3 % (ref 11.5–15.5)
WBC: 10.3 10*3/uL (ref 4.0–10.5)

## 2014-04-30 LAB — PHOSPHORUS: Phosphorus: 2.8 mg/dL (ref 2.3–4.6)

## 2014-04-30 MED ORDER — FAT EMULSION 20 % IV EMUL
240.0000 mL | INTRAVENOUS | Status: AC
  Administered 2014-04-30: 240 mL via INTRAVENOUS
  Filled 2014-04-30: qty 250

## 2014-04-30 MED ORDER — POTASSIUM CHLORIDE 10 MEQ/100ML IV SOLN
10.0000 meq | INTRAVENOUS | Status: AC
  Administered 2014-04-30 (×4): 10 meq via INTRAVENOUS
  Filled 2014-04-30 (×4): qty 100

## 2014-04-30 MED ORDER — MAGNESIUM SULFATE IN D5W 10-5 MG/ML-% IV SOLN
1.0000 g | Freq: Once | INTRAVENOUS | Status: AC
  Administered 2014-04-30: 1 g via INTRAVENOUS
  Filled 2014-04-30: qty 100

## 2014-04-30 MED ORDER — TRACE MINERALS CR-CU-F-FE-I-MN-MO-SE-ZN IV SOLN
INTRAVENOUS | Status: AC
  Administered 2014-04-30: 18:00:00 via INTRAVENOUS
  Filled 2014-04-30: qty 1680

## 2014-04-30 MED ORDER — DEXTROSE 5 % IV SOLN
10.0000 mmol | Freq: Once | INTRAVENOUS | Status: AC
  Administered 2014-04-30: 10 mmol via INTRAVENOUS
  Filled 2014-04-30: qty 3.33

## 2014-04-30 MED ORDER — CIPROFLOXACIN IN D5W 400 MG/200ML IV SOLN
400.0000 mg | Freq: Two times a day (BID) | INTRAVENOUS | Status: AC
  Administered 2014-04-30 – 2014-05-01 (×3): 400 mg via INTRAVENOUS
  Filled 2014-04-30 (×3): qty 200

## 2014-04-30 NOTE — Progress Notes (Signed)
NUTRITION FOLLOW-UP  DOCUMENTATION CODES Per approved criteria  -Non-severe (moderate) malnutrition in the context of acute illness or injury -Obesity, unspecified  Pt meets criteria for moderate MALNUTRITION in the context of acute illness as evidenced by energy intake of <75% for > 7 days and mild fluid accumulation.  INTERVENTION: - TPN per pharmacy - Calorie Count D/C - Continue Ensure Enlive po BID, each supplement provides 350 kcal and 20 grams of protein - RD will continue to monitor  NUTRITION DIAGNOSIS: Inadequate oral intake related to poor appetite as evidenced by poor PO intake; ongoing  Goal: Pt to meet >/= 90% of their estimated nutrition needs; not met   Monitor:  PO and supplemental intake, weight, labs, I/O's, TPN initiation and tolerance  Admitting Dx: Primary cancer of tail of pancreas  ASSESSMENT: 69 year old female who presents with pancreatic cancer. S/p Procedure(s): XI ROBOTIC ASSISTED LAPAROSCOPIC DISTAL PANCREATECTOMY/SPLENECTOMY   3/29 Pt in room with nurse tech. Pt reports eating well PTA. Pt did develop diarrhea around 3/17. Pt's appetite has been poor. Pt had a bite of pudding and a sip of tomato soup for lunch and states she did not tolerate them well.   3/31: - Consult for new TPN until pt can better tolerate po. - Diarrhea improved. - Pt curent has ongoing Calorie Count with no PO intake. She has been having nausea/vomiting and been unable to keep anything down.  - RD will continue to monitor  4/1: -Pt continues to have poor PO intake. Nausea and diarrhea have improved.  -Pt is tolerating TPN @ 40 ml/hr.  -Mg/Phos WNL -Low K, elevated Creatinine  Plan per Pharmacy: At 1800 today:  Increase Clinimix E 5/15 rate to 70 ml/hr.  20% fat emulsion at 10 ml/hr.  Plan to advance as tolerated to the goal rate.  TPN to contain standard multivitamins and trace elements.  continuet sensitive SSI q6h .   TPN lab panels on Mondays &  Thursdays.  F/u daily.  Height: Ht Readings from Last 1 Encounters:  04/08/14 5' 9.5" (1.765 m)    Weight: Wt Readings from Last 1 Encounters:  04/30/14 213 lb 6.5 oz (96.8 kg)    BMI:  Body mass index is 31.07 kg/(m^2).  Estimated Nutritional Needs: Kcal: 1800-2000 Protein: 120-130 g Fluid: 1.8 L/day  Skin: abdominal incision  Diet Order: Diet full liquid Room service appropriate?: Yes; Fluid consistency:: Thin TPN (CLINIMIX-E) Adult .TPN (CLINIMIX-E) Adult  EDUCATION NEEDS: -No education needs identified at this time   Intake/Output Summary (Last 24 hours) at 04/30/14 1257 Last data filed at 04/30/14 1015  Gross per 24 hour  Intake    560 ml  Output   1265 ml  Net   -705 ml    Last BM: 3/31  Labs:   Recent Labs Lab 04/28/14 0413 04/29/14 0420 04/29/14 1417 04/30/14 0450  NA 140 142  --  139  K 3.2* 3.3*  --  3.0*  CL 109 109  --  108  CO2 21 20  --  24  BUN 18 17  --  14  CREATININE 2.97* 2.67*  --  2.23*  CALCIUM 8.6 8.9  --  8.6  MG  --   --  1.4* 1.8  PHOS 4.5 3.7  --  2.8  GLUCOSE 91 93  --  127*    CBG (last 3)   Recent Labs  04/29/14 2332 04/30/14 0548 04/30/14 1146  GLUCAP 121* 125* 124*    Scheduled Meds: . acetaminophen  650 mg Oral Q6H  . atorvastatin  40 mg Oral q1800  . brimonidine  1 drop Left Eye BID   And  . timolol  1 drop Left Eye BID  . ciprofloxacin  400 mg Intravenous Q12H  . darbepoetin (ARANESP) injection - NON-DIALYSIS  100 mcg Subcutaneous Q Tue-1800  . enoxaparin (LOVENOX) injection  80 mg Subcutaneous Q24H  . feeding supplement (ENSURE ENLIVE)  237 mL Oral TID BM  . insulin aspart  0-9 Units Subcutaneous 4 times per day  . latanoprost  1 drop Both Eyes QHS  . magnesium sulfate 1 - 4 g bolus IVPB  1 g Intravenous Once  . pantoprazole  40 mg Oral Daily  . potassium chloride  10 mEq Intravenous Q1 Hr x 4  . potassium phosphate IVPB (mmol)  10 mmol Intravenous Once  . primidone  50 mg Oral QHS  . sodium  chloride  10-40 mL Intracatheter Q12H  . sucralfate  1 g Oral TID WC & HS  . tamsulosin  0.4 mg Oral Daily    Continuous Infusions: . Marland KitchenTPN (CLINIMIX-E) Adult     And  . fat emulsion    . Marland KitchenTPN (CLINIMIX-E) Adult 40 mL/hr at 04/29/14 1718   And  . fat emulsion 240 mL (04/29/14 1718)     Clayton Bibles, MS, RD, LDN Pager: [phone] After Hours Pager: [phone]

## 2014-04-30 NOTE — Progress Notes (Signed)
PARENTERAL NUTRITION CONSULT NOTE - INITIAL  Pharmacy Consult for TPN Indication: not tolerating enteral feeding  Allergies  Allergen Reactions  . Sulfa Antibiotics Swelling    Patient Measurements: Height: 5' 9.5" (176.5 cm) Weight: 213 lb 6.5 oz (96.8 kg) IBW/kg (Calculated) : 67.35 Adjusted Body Weight: 74 kg Usual body weight: 100 kg  Vital Signs: Temp: 98.8 F (37.1 C) (04/01 0628) Temp Source: Oral (04/01 0628) BP: 150/78 mmHg (04/01 0628) Pulse Rate: 78 (04/01 0628) Intake/Output from previous day: 03/31 0701 - 04/01 0700 In: 640 [P.O.:600; I.V.:40] Out: 1655 [Urine:1625; Drains:30] Intake/Output from this shift:    Labs:  Recent Labs  04/28/14 0413 04/30/14 0450  WBC 9.0 10.3  HGB 7.9* 7.9*  HCT 25.2* 24.7*  PLT 448* 462*     Recent Labs  04/28/14 0413 04/29/14 0420 04/29/14 1417 04/30/14 0450  NA 140 142  --  139  K 3.2* 3.3*  --  3.0*  CL 109 109  --  108  CO2 21 20  --  24  GLUCOSE 91 93  --  127*  BUN 18 17  --  14  CREATININE 2.97* 2.67*  --  2.23*  CALCIUM 8.6 8.9  --  8.6  MG  --   --  1.4* 1.8  PHOS 4.5 3.7  --  2.8  PROT  --   --   --  6.0  ALBUMIN 3.1* 3.4*  --  3.0*  AST  --   --   --  29  ALT  --   --   --  10  ALKPHOS  --   --   --  65  BILITOT  --   --   --  0.3   Estimated Creatinine Clearance: 30.6 mL/min (by C-G formula based on Cr of 2.23).    Recent Labs  04/29/14 1659 04/29/14 2332 04/30/14 0548  GLUCAP 100* 121* 125*    Medical History: Past Medical History  Diagnosis Date  . Glaucoma   . Clotting disorder   . DVT (deep venous thrombosis)     LEFT  . GERD (gastroesophageal reflux disease)   . Hypercholesterolemia   . Peripheral neuropathy   . Essential tremor   . Diverticulosis   . Arthritis     knees and back  . Cancer     Pancreatic  . Hypertension   . Colon polyps   . UTI (lower urinary tract infection)   . Complication of anesthesia     slow to wak up   . Pulmonary embolism      Insulin Requirements: on sensitive SSI q6h (2 units since TPN started on 3/31)  Current Nutrition: ensure 237 TID, full liquid diet TPN   IVF: none  Central access: PICC placed 04/18/14 TPN start date: 04/29/14  ASSESSMENT                                                                                                          Patient is a 69 y.o F with pancreatic cancer s/p pancreatectomy and splenectomy, partial  colectomy on 3/10.  She has poor oral intake with resource/ensure started for on 3/29.  She now has n/v and not able to keep anything down.  To start TPN while patient has poor oral intake.  HPI: pancreatic cancer, DVT, PE, HLP, GERD, HTN  Significant events:  3/10: distal pancreatectomy and splenectomy, partial colectomy 3/18: drainage of L upper abd fluid collection/abscesses with perc drain pacement  Today:    Glucose (cbgs goal<150): <150  Electrolytes: K 3 (s/p 3 runs kcl on 3/31- four runs ordered but only 3 were charted as given), phos down 2.8, Mag up 1.8 (s/p 2gm Mag sulfate on 3/31), Na wnl  Renal: AKI, scr trending down 2.23 (crcl~30), UOP 0.7  LFTs: wnl   TGs: pending  Prealbumin: pending, albumin 3  NUTRITIONAL GOALS                                                                                             RD recs:  Kcal: 1800-2000  Protein: 120-130 g  Fluid: 1.8 L/day  Based on RD's recommendations, will adjust Clinimix E 5/15 goal rate to 100 ml/hr + 20% fat emulsion at 10 ml/hr to provide: 120 g/day protein, 2184 Kcal/day (kcal/day content will be slightly higher than RD's recommendations as clinimix bags come in standard concentrations and we are not able to adjust kcal content in them)  PLAN                                                                                                                         1) Magnesium sulfate 1gm IV x1, KCL 10 mEq x4 runs (per MD) 2) phosphorus trending down to lower end of therapeutic range at 2.8  today.  Will anticipate this to go down further with improving renal function-- hence, will give  K phos 67mmol IV x1 today 3) will recheck mag and phos in AM to assess supplementations 4) At 1800 today:  Increase Clinimix E 5/15 rate to 70 ml/hr.  20% fat emulsion at 10 ml/hr.  Plan to advance as tolerated to the goal rate.  TPN to contain standard multivitamins and trace elements.  continuet sensitive SSI q6h .   TPN lab panels on Mondays & Thursdays.  F/u daily.  [name] P 04/30/2014,7:10 AM

## 2014-04-30 NOTE — Progress Notes (Signed)
FL2 in chart for MD signature. CSW assisting with d/c planning. Pt is considering [facility] following hospital d/c. These facilities are able to accept pt with TPN, though 24 hr notice, prior to d/c, is needed for preparation. CSW will continue to follow to assist with d/c planning to SNF.  Werner Lean LCSW [phone]

## 2014-04-30 NOTE — Progress Notes (Signed)
Attempted Sitz bath to help patient with voiding and was unsuccessful, order given by MD Marcello Moores to reinsert patient's foley is sitz bath was unsuccessful. Order entered for foley Neta Mends RN 04-30-2014 16:05pm

## 2014-04-30 NOTE — Progress Notes (Signed)
Calorie Count Note  48- hour calorie count ordered.  Diet: full liquid Supplements: Ensure Enlive po TID, each supplement provides 350 kcal and 20 grams of protein  3/31: Dinner: 0%, only ice chips Supplements: 0%  Total intake: 0 kcal (0% of minimum estimated needs)  0 protein (0% of minimum estimated needs)  Pt's nausea and diarrhea has improved slightly.   Nutrition Dx: Inadequate oral intake related to poor appetite as evidenced by poor PO intake, ongoing.  Goal: Pt to meet >/= 90% of their estimated nutrition needs, unmet   Intervention: Continue supplements  Encourage PO intake  Clayton Bibles, MS, RD, LDN Pager: [phone] After Hours Pager: [phone]

## 2014-04-30 NOTE — Progress Notes (Signed)
Patient ID: [name], female   DOB: 1945/03/06, 69 y.o.   MRN: [id number]     [address] [place], [place] [address]    Phone: [phone] FAX: [phone]     Subjective: CT removed drain #1 80m.  Drain #2 16m  Nausea is better since last evening.  Diarrhea has improved since CT yesterday.  VSS.  Afebrile.    Objective:  Vital signs:  Filed Vitals:   04/29/14 0545 04/29/14 1349 04/29/14 2158 04/30/14 0628  BP: 147/67 161/76 152/80 150/78  Pulse: 97 77 80 78  Temp: 98.1 F (36.7 C) 98.3 F (36.8 C) 98.6 F (37 C) 98.8 F (37.1 C)  TempSrc: Oral Oral Oral Oral  Resp: _0 Height:      Weight:    96.8 kg (213 lb 6.5 oz)  SpO2: 98% 100% 98% 100%    Last BM Date: 04/29/14  Intake/Output   Yesterday:  03/31 0701 - 04/01 0700 In: 640 [P.O.:600; I.V.:40] Out: 1655 [Urine:1625; Drains:30] This shift: I/O last 3 completed shifts: In: 1080 [P.O.:1020; I.V.:40; Other:20] Out: 3021 [Urine:2925; Drains:95; Stool:1]      Physical Exam: General: Pt awake/alert/oriented x4 in no acute distress Chest: clear. No chest wall pain w good excursion CV: Pulses intact. Regular rhythm MS: Normal AROM mjr joints. No obvious deformity Abdomen: Soft. Nondistended. Mildly tender at incisions only. Drain #1 brown oupput. Drain #2--serous output. Incisions are c/d/i No evidence of peritonitis. No incarcerated hernias. Ext: SCDs BLE. BLE edema. 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 Active Problems:   Carcinoma of tail of pancreas   Abscess of abdominal cavity   Malnutrition of moderate degree   Intra-abdominal abscess    Results:   Labs: Results for orders placed or performed during the hospital encounter of 04/08/14 (from the past 48 hour(s))   Renal function panel     Status: Abnormal   Collection Time: 04/29/14  4:20 AM  Result Value Ref Range   Sodium 142 135 - 145 mmol/L   Potassium 3.3 (L) 3.5 - 5.1 mmol/L   Chloride 109 96 - 112 mmol/L   CO2 20 19 - 32 mmol/L   Glucose, Bld 93 70 - 99 mg/dL   BUN 17 6 - 23 mg/dL   Creatinine, Ser 2.67 (H) 0.50 - 1.10 mg/dL   Calcium 8.9 8.4 - 10.5 mg/dL   Phosphorus 3.7 2.3 - 4.6 mg/dL   Albumin 3.4 (L) 3.5 - 5.2 g/dL   GFR calc non Af Amer 17 (L) >90 mL/min   GFR calc Af Amer 20 (L) >90 mL/min    Comment: (NOTE) The eGFR has been calculated using the CKD EPI equation. This calculation has not been validated in all clinical situations. eGFR's persistently <90 mL/min signify possible Chronic Kidney Disease.    Anion gap 13 5 - 15  Urinalysis, Routine w reflex microscopic     Status: Abnormal   Collection Time: 04/29/14 10:34 AM  Result Value Ref Range   Color, Urine YELLOW YELLOW   APPearance CLEAR CLEAR   Specific Gravity, Urine 1.010 1.005 - 1.030   pH 6.0 5.0 - 8.0   Glucose, UA NEGATIVE NEGATIVE mg/dL   Hgb urine dipstick SMALL (A) NEGATIVE   Bilirubin Urine NEGATIVE NEGATIVE   Ketones,  ur 15 (A) NEGATIVE mg/dL   Protein, ur NEGATIVE NEGATIVE mg/dL   Urobilinogen, UA 0.2 0.0 - 1.0 mg/dL   Nitrite NEGATIVE NEGATIVE   Leukocytes, UA SMALL (A) NEGATIVE  Urine microscopic-add on     Status: None   Collection Time: 04/29/14 10:34 AM  Result Value Ref Range   Squamous Epithelial / LPF RARE RARE   WBC, UA 11-20 <3 WBC/hpf   RBC / HPF 3-6 <3 RBC/hpf   Bacteria, UA RARE RARE  Magnesium     Status: Abnormal   Collection Time: 04/29/14  2:17 PM  Result Value Ref Range   Magnesium 1.4 (L) 1.5 - 2.5 mg/dL  Glucose, capillary     Status: None   Collection Time: 04/29/14  3:11 PM  Result Value Ref Range   Glucose-Capillary 85 70 - 99 mg/dL  Glucose, capillary     Status: Abnormal   Collection Time: 04/29/14  4:59 PM  Result Value Ref Range   Glucose-Capillary 100 (H) 70 -  99 mg/dL  Glucose, capillary     Status: Abnormal   Collection Time: 04/29/14 11:32 PM  Result Value Ref Range   Glucose-Capillary 121 (H) 70 - 99 mg/dL   Comment 1 Notify RN   Triglycerides     Status: None   Collection Time: 04/30/14  4:30 AM  Result Value Ref Range   Triglycerides 82 <150 mg/dL    Comment: Performed at [facility]  Magnesium     Status: None   Collection Time: 04/30/14  4:50 AM  Result Value Ref Range   Magnesium 1.8 1.5 - 2.5 mg/dL  CBC     Status: Abnormal   Collection Time: 04/30/14  4:50 AM  Result Value Ref Range   WBC 10.3 4.0 - 10.5 K/uL   RBC 2.67 (L) 3.87 - 5.11 MIL/uL   Hemoglobin 7.9 (L) 12.0 - 15.0 g/dL   HCT 24.7 (L) 36.0 - 46.0 %   MCV 92.5 78.0 - 100.0 fL   MCH 29.6 26.0 - 34.0 pg   MCHC 32.0 30.0 - 36.0 g/dL   RDW 15.3 11.5 - 15.5 %   Platelets 462 (H) 150 - 400 K/uL  Differential     Status: Abnormal   Collection Time: 04/30/14  4:50 AM  Result Value Ref Range   Neutrophils Relative % 63 43 - 77 %   Neutro Abs 6.6 1.7 - 7.7 K/uL   Lymphocytes Relative 20 12 - 46 %   Lymphs Abs 2.0 0.7 - 4.0 K/uL   Monocytes Relative 13 (H) 3 - 12 %   Monocytes Absolute 1.4 (H) 0.1 - 1.0 K/uL   Eosinophils Relative 3 0 - 5 %   Eosinophils Absolute 0.3 0.0 - 0.7 K/uL   Basophils Relative 1 0 - 1 %   Basophils Absolute 0.1 0.0 - 0.1 K/uL  Comprehensive metabolic panel     Status: Abnormal   Collection Time: 04/30/14  4:50 AM  Result Value Ref Range   Sodium 139 135 - 145 mmol/L   Potassium 3.0 (L) 3.5 - 5.1 mmol/L   Chloride 108 96 - 112 mmol/L   CO2 24 19 - 32 mmol/L   Glucose, Bld 127 (H) 70 - 99 mg/dL   BUN 14 6 - 23 mg/dL   Creatinine, Ser 2.23 (H) 0.50 - 1.10 mg/dL   Calcium 8.6 8.4 - 10.5 mg/dL   Total Protein 6.0 6.0 - 8.3 g/dL   Albumin 3.0 (L) 3.5 - 5.2 g/dL  AST 29 0 - 37 U/L   ALT 10 0 - 35 U/L   Alkaline Phosphatase 65 39 - 117 U/L   Total Bilirubin 0.3 0.3 - 1.2 mg/dL   GFR calc non Af Amer 22 (L) >90 mL/min   GFR calc  Af Amer 25 (L) >90 mL/min    Comment: (NOTE) The eGFR has been calculated using the CKD EPI equation. This calculation has not been validated in all clinical situations. eGFR's persistently <90 mL/min signify possible Chronic Kidney Disease.    Anion gap 7 5 - 15  Phosphorus     Status: None   Collection Time: 04/30/14  4:50 AM  Result Value Ref Range   Phosphorus 2.8 2.3 - 4.6 mg/dL  Glucose, capillary     Status: Abnormal   Collection Time: 04/30/14  5:48 AM  Result Value Ref Range   Glucose-Capillary 125 (H) 70 - 99 mg/dL   Comment 1 Notify RN     Imaging / Studies: Ct Abdomen Pelvis Wo Contrast  04/29/2014   CLINICAL DATA:  Intermittent nausea and vomiting with poor appetite and left upper quadrant soreness. History of distal pancreatectomy/ splenectomy and partial colectomy. Subsequent encounter.  EXAM: CT ABDOMEN AND PELVIS WITHOUT CONTRAST  TECHNIQUE: Multidetector CT imaging of the abdomen and pelvis was performed following the standard protocol without IV contrast.  COMPARISON:  04/24/2014.  MR abdomen 01/05/2014.  FINDINGS: Lower chest: Lung bases show a moderate simple appearing left pleural effusion with likely compressive atelectasis in the left lower lobe. Heart size within normal limits. Decreased attenuation of the intravascular compartment is indicative of anemia. No pericardial effusion.  Hepatobiliary: A lobulated low-attenuation lesion in the inferior right hepatic lobe measures 2.5 cm, stable, characterized as a cyst on 01/05/2014. Liver is otherwise unremarkable. Cholecystectomy. Extrahepatic bile duct measures 1.4 cm, stable.  Pancreas: Status post distal pancreatectomy with a focal low-attenuation lesion at the surgical margin, measuring 2.7 x 3.0 cm, grossly stable in size. Pancreas is otherwise unremarkable.  Spleen: Surgically absent.  Adrenals/Urinary Tract: Adrenal glands and right kidney are unremarkable. Low-attenuation lesion in the interpolar left kidney measures  1.5 cm, stable and characterized as a cyst on 01/05/2014. Ureters are decompressed. Air in the bladder is presumably iatrogenic. Bladder wall may be slightly thickened.  Stomach/Bowel: Stomach, small bowel and proximal colon are unremarkable. An anastomosis is seen at the splenic flexure. Colon is otherwise unremarkable.  Vascular/Lymphatic: Atherosclerotic calcification of the arterial vasculature without abdominal aortic aneurysm. No pathologically enlarged lymph nodes. IVC filter is in place.  Reproductive: Hysterectomy.  Ovaries are visualized.  Other: Small pelvic free fluid. 2 percutaneous drains terminate in the left upper quadrant, along the posterior margin of the stomach and anterior margin of the left kidney. No definite associated fluid collection. Upper abdominal midline ventral hernia contains fat, small in size. Presumed subcutaneous injection densities and air in the ventral low abdomen and pelvis. Mesenteries and peritoneum are otherwise unremarkable.  Musculoskeletal: No worrisome lytic or sclerotic lesions.  IMPRESSION: 1. Postoperative changes of distal pancreatectomy with stable pseudocyst or seroma adjacent to the surgical margin. 2. Resolved left upper quadrant abscess with 2 percutaneous drains in place. 3. Small pelvic free fluid. 4. Moderate left pleural effusion with likely compressive atelectasis in the left lower lobe. Difficult to definitively exclude pneumonia.   Electronically Signed   By: Lorin Picket M.D.   On: 04/29/2014 15:25    Medications / Allergies:  Scheduled Meds: . acetaminophen  650 mg Oral Q6H  .  atorvastatin  40 mg Oral q1800  . brimonidine  1 drop Left Eye BID   And  . timolol  1 drop Left Eye BID  . ciprofloxacin  400 mg Intravenous Q24H  . darbepoetin (ARANESP) injection - NON-DIALYSIS  100 mcg Subcutaneous Q Tue-1800  . enoxaparin (LOVENOX) injection  80 mg Subcutaneous Q24H  . feeding supplement (ENSURE ENLIVE)  237 mL Oral TID BM  . insulin aspart   0-9 Units Subcutaneous 4 times per day  . latanoprost  1 drop Both Eyes QHS  . pantoprazole  40 mg Oral Daily  . potassium chloride  10 mEq Intravenous Q1 Hr x 4  . potassium chloride  40 mEq Oral Daily  . primidone  50 mg Oral QHS  . sodium chloride  10-40 mL Intracatheter Q12H  . sucralfate  1 g Oral TID WC & HS  . tamsulosin  0.4 mg Oral Daily   Continuous Infusions: . Marland KitchenTPN (CLINIMIX-E) Adult 40 mL/hr at 04/29/14 1718   And  . fat emulsion 240 mL (04/29/14 1718)   PRN Meds:.albuterol, alum & mag hydroxide-simeth, lip balm, ondansetron **OR** ondansetron (ZOFRAN) IV, sodium chloride, traMADol  Antibiotics: Anti-infectives    Start     Dose/Rate Route Frequency Ordered Stop   04/27/14 1000  ciprofloxacin (CIPRO) IVPB 400 mg     400 mg 200 mL/hr over 60 Minutes Intravenous Every 24 hours 04/26/14 1038     04/21/14 1000  ciprofloxacin (CIPRO) tablet 500 mg  Status:  Discontinued     500 mg Oral Every 24 hours 04/21/14 0849 04/26/14 1038   04/20/14 1400  piperacillin-tazobactam (ZOSYN) IVPB 2.25 g  Status:  Discontinued     2.25 g 100 mL/hr over 30 Minutes Intravenous Every 6 hours 04/20/14 0713 04/21/14 0849   04/15/14 1800  vancomycin (VANCOCIN) 1,250 mg in sodium chloride 0.9 % 250 mL IVPB  Status:  Discontinued     1,250 mg 166.7 mL/hr over 90 Minutes Intravenous Every 12 hours 04/15/14 0801 04/17/14 0643   04/15/14 1500  piperacillin-tazobactam (ZOSYN) IVPB 3.375 g  Status:  Discontinued     3.375 g 12.5 mL/hr over 240 Minutes Intravenous 3 times per day 04/15/14 0801 04/20/14 1316   04/15/14 0900  vancomycin (VANCOCIN) 1,500 mg in sodium chloride 0.9 % 500 mL IVPB     1,500 mg 250 mL/hr over 120 Minutes Intravenous  Once 04/15/14 0801 04/15/14 1231   04/15/14 0830  piperacillin-tazobactam (ZOSYN) IVPB 3.375 g     3.375 g 100 mL/hr over 30 Minutes Intravenous  Once 04/15/14 0801 04/15/14 0928   04/08/14 1600  cefoTEtan (CEFOTAN) 2 g in dextrose 5 % 50 mL IVPB     2 g 100  mL/hr over 30 Minutes Intravenous Every 12 hours 04/08/14 1522 04/09/14 0530   04/08/14 0537  ceFAZolin (ANCEF) IVPB 2 g/50 mL premix     2 g 100 mL/hr over 30 Minutes Intravenous On call to O.R. 04/08/14 3785 04/08/14 0759       Assessment/Plan Carcinoma at the tail of the pancreas POD#22 robotic assisted converted to laparoscopic hand assisted distal pancreatectomy, splenectomy with partial colectomy---Dr. Barry Dienes -diarrhea has decreased, no vomiting, nausea improved.  CT abdomen and pelvis 3/31 shows resolved abscess, no other signigicant findings.  Appetite remains poor, but wants to try liquids today.  Can advance as tolerated.  Tramadol changed to PRN 3/31 and nausea has improved so will hold off on adding Carafate.   -give fulls, ensure supplements,TPN until she is  able to tolerate POs.   -SCD/lovenox -mobilize, needs to increase her activity, PT eval and treat LUQ subphrenic abscess -resolved on CT 3/31. ?when to remove drains -c/w cipro IV D#9.  Can change to PO when nausea subsides.  ?duration of therapy -culture +pseudomonas Hypokalemia -73mq K today, repeat BMP in AM Urinary retention -continue with I&O cath q8h -flomax Tremors  -primidone Acute renal failure---non oliguric ATN -renal following at distance, appreciate assistance -improving  DVT/PE -lovenox per pharmacy, has an IVC filter Anemia -hgb stable at 7.9, monitor Dispo--continue inpatient, await resolution N/V. Renal function is better, can be followed on OP basis per nephrology.  [facility] [phone](7A-4:30P)   04/30/2014 9:29 AM

## 2014-04-30 NOTE — Progress Notes (Signed)
Patient ID: [name], female   DOB: 01-25-1946, 69 y.o.   MRN: [id number]    Referring Physician(s): [facility]CS  Subjective:  Pt doing ok; sitting up in chair ; awaiting PT session; denies worsening abd pain; still with urinary retention Allergies: Sulfa antibiotics  Medications: Prior to Admission medications   Medication Sig Start Date End Date Taking? Authorizing Provider  alum & mag hydroxide-simeth (MAALOX PLUS) 400-400-40 MG/5ML suspension Take 30 mLs by mouth every 6 (six) hours as needed for indigestion.   Yes Historical Provider, MD  atorvastatin (LIPITOR) 40 MG tablet TAKE 1 TABLET BY MOUTH DAILY 03/28/14  Yes Mancel Bale, PA-C  brimonidine-timolol (COMBIGAN) 0.2-0.5 % ophthalmic solution Place 1 drop into the left eye every 12 (twelve) hours.   Yes Historical Provider, MD  CARTIA XT 120 MG 24 hr capsule TAKE ONE CAPSULE BY MOUTH DAILY Patient taking differently: TAKE ONE CAPSULE BY MOUTH DAILY IN THE EVENING 03/28/14  Yes Mancel Bale, PA-C  enoxaparin (LOVENOX) 100 MG/ML injection Inject 1 mL (100 mg total) into the skin every 12 (twelve) hours. 02/16/14  Yes Truitt Merle, MD  gabapentin (NEURONTIN) 300 MG capsule TAKE ONE CAPSULE BY MOUTH AT BEDTIME; AFTER 7 DAYS, MAY INCREASE TO 2 CAPSULES AT BEDTIME 03/28/14  Yes Mancel Bale, PA-C  HYDROcodone-acetaminophen (NORCO/VICODIN) 5-325 MG per tablet Take 1 tablet by mouth every 6 (six) hours as needed for moderate pain. Patient taking differently: Take 2 tablets by mouth every 4 (four) hours as needed for moderate pain.  12/31/13  Yes Wardell Honour, MD  latanoprost (XALATAN) 0.005 % ophthalmic solution Place 1 drop into both eyes at bedtime.   Yes Historical Provider, MD  pantoprazole (PROTONIX) 40 MG tablet TAKE 1 TABLET BY MOUTH DAILY 03/28/14  Yes Leandrew Koyanagi, MD  primidone (MYSOLINE) 50 MG tablet Take 50 mg by mouth daily. Patient takes at nite   Yes Historical Provider, MD  propranolol ER (INDERAL LA) 120 MG 24 hr capsule  TAKE ONE CAPSULE BY MOUTH DAILY Patient taking differently: TAKE ONE CAPSULE BY MOUTH DAILY AT BEDTIME 03/28/14  Yes Mancel Bale, PA-C  acetaminophen (TYLENOL) 500 MG tablet Take 500 mg by mouth every 6 (six) hours as needed (Pain).    Historical Provider, MD  doxycycline (VIBRA-TABS) 100 MG tablet Take 1 tablet (100 mg total) by mouth 2 (two) times daily. 03/19/14   Golden Circle, FNP  traMADol (ULTRAM) 50 MG tablet Take 1 tablet (50 mg total) by mouth every 8 (eight) hours as needed. Patient not taking: Reported on 03/29/2014 03/19/14   Golden Circle, FNP     Vital Signs: BP 154/82 mmHg  Pulse 80  Temp(Src) 98.5 F (36.9 C) (Oral)  Resp 16  Ht 5' 9.5" (1.765 m)  Wt 213 lb 6.5 oz (96.8 kg)  BMI 31.07 kg/m2  SpO2 100%  Physical Exam pt awake/alert; LUQ drain intact, insertion site ok, mildly tender; output minimal amt bloody fluid  Imaging: Ct Abdomen Pelvis Wo Contrast  04/29/2014   CLINICAL DATA:  Intermittent nausea and vomiting with poor appetite and left upper quadrant soreness. History of distal pancreatectomy/ splenectomy and partial colectomy. Subsequent encounter.  EXAM: CT ABDOMEN AND PELVIS WITHOUT CONTRAST  TECHNIQUE: Multidetector CT imaging of the abdomen and pelvis was performed following the standard protocol without IV contrast.  COMPARISON:  04/24/2014.  MR abdomen 01/05/2014.  FINDINGS: Lower chest: Lung bases show a moderate simple appearing left pleural effusion with likely compressive atelectasis in  the left lower lobe. Heart size within normal limits. Decreased attenuation of the intravascular compartment is indicative of anemia. No pericardial effusion.  Hepatobiliary: A lobulated low-attenuation lesion in the inferior right hepatic lobe measures 2.5 cm, stable, characterized as a cyst on 01/05/2014. Liver is otherwise unremarkable. Cholecystectomy. Extrahepatic bile duct measures 1.4 cm, stable.  Pancreas: Status post distal pancreatectomy with a focal  low-attenuation lesion at the surgical margin, measuring 2.7 x 3.0 cm, grossly stable in size. Pancreas is otherwise unremarkable.  Spleen: Surgically absent.  Adrenals/Urinary Tract: Adrenal glands and right kidney are unremarkable. Low-attenuation lesion in the interpolar left kidney measures 1.5 cm, stable and characterized as a cyst on 01/05/2014. Ureters are decompressed. Air in the bladder is presumably iatrogenic. Bladder wall may be slightly thickened.  Stomach/Bowel: Stomach, small bowel and proximal colon are unremarkable. An anastomosis is seen at the splenic flexure. Colon is otherwise unremarkable.  Vascular/Lymphatic: Atherosclerotic calcification of the arterial vasculature without abdominal aortic aneurysm. No pathologically enlarged lymph nodes. IVC filter is in place.  Reproductive: Hysterectomy.  Ovaries are visualized.  Other: Small pelvic free fluid. 2 percutaneous drains terminate in the left upper quadrant, along the posterior margin of the stomach and anterior margin of the left kidney. No definite associated fluid collection. Upper abdominal midline ventral hernia contains fat, small in size. Presumed subcutaneous injection densities and air in the ventral low abdomen and pelvis. Mesenteries and peritoneum are otherwise unremarkable.  Musculoskeletal: No worrisome lytic or sclerotic lesions.  IMPRESSION: 1. Postoperative changes of distal pancreatectomy with stable pseudocyst or seroma adjacent to the surgical margin. 2. Resolved left upper quadrant abscess with 2 percutaneous drains in place. 3. Small pelvic free fluid. 4. Moderate left pleural effusion with likely compressive atelectasis in the left lower lobe. Difficult to definitively exclude pneumonia.   Electronically Signed   By: Lorin Picket M.D.   On: 04/29/2014 15:25    Labs:  CBC:  Recent Labs  04/24/14 0400 04/25/14 0436 04/28/14 0413 04/30/14 0450  WBC 9.1 9.4 9.0 10.3  HGB 8.6* 7.9* 7.9* 7.9*  HCT 26.5* 24.7*  25.2* 24.7*  PLT 549* 537* 448* 462*    COAGS:  Recent Labs  04/01/14 1400 04/08/14 0630 04/09/14 0404  INR 1.11 1.06 1.12  APTT  --  40* 33    BMP:  Recent Labs  04/27/14 0455 04/28/14 0413 04/29/14 0420 04/30/14 0450  NA 140 140 142 139  K 3.3* 3.2* 3.3* 3.0*  CL 108 109 109 108  CO2 18* 21 20 24   GLUCOSE 81 91 93 127*  BUN 21 18 17 14   CALCIUM 8.4 8.6 8.9 8.6  CREATININE 3.54* 2.97* 2.67* 2.23*  GFRNONAA 12* 15* 17* 22*  GFRAA 14* 18* 20* 25*    LIVER FUNCTION TESTS:  Recent Labs  02/16/14 1443 04/01/14 1400 04/24/14 2039  04/27/14 0455 04/28/14 0413 04/29/14 0420 04/30/14 0450  BILITOT 0.42 0.7 0.5  --   --   --   --  0.3  AST 24 35 30  --   --   --   --  29  ALT 10 21 13   --   --   --   --  10  ALKPHOS 96 75 83  --   --   --   --  65  PROT 7.2 7.6 6.1  --   --   --   --  6.0  ALBUMIN 3.6 3.9 2.4*  < > 3.3* 3.1* 3.4* 3.0*  < > =  values in this interval not displayed.  Assessment and Plan: S/p LUQ abscess drainage 3/18 (post distal pancreatectomy/splenectomy/partial colectomy 04/08/14 for panc ca); WBC nl; hgb stable; creat improving , now at 2.23; latest CT shows resolved LUQ abscess; per [facility]CS drains are to remain in place for now ; Dr. Barry Dienes will return next week and determine timing of removal   Signed: Sopheap Basic,D KEVIN 04/30/2014, 3:16 PM   I spent a total of 15 minutes in face to face in clinical consultation/evaluation, greater than 50% of which was counseling/coordinating care for LUQ abscess drain

## 2014-04-30 NOTE — Progress Notes (Addendum)
[facility] NOTE  Pharmacy Consult for Lovenox; cipro Indication: h/o recurrent DVT; pseudomonas LUQ subphrenic abscess   Allergies  Allergen Reactions  . Sulfa Antibiotics Swelling    Patient Measurements: Height: 5' 9.5" (176.5 cm) Weight: 213 lb 6.5 oz (96.8 kg) IBW/kg (Calculated) : 67.35   Vital Signs: Temp: 98.8 F (37.1 C) (04/01 0628) Temp Source: Oral (04/01 0628) BP: 150/78 mmHg (04/01 0628) Pulse Rate: 78 (04/01 0628)  Labs:  Recent Labs  04/28/14 0413 04/29/14 0420 04/30/14 0450  HGB 7.9*  --  7.9*  HCT 25.2*  --  24.7*  PLT 448*  --  462*  CREATININE 2.97* 2.67* 2.23*    Estimated Creatinine Clearance: 30.6 mL/min (by C-G formula based on Cr of 2.23).   Medical History: Past Medical History  Diagnosis Date  . Glaucoma   . Clotting disorder   . DVT (deep venous thrombosis)     LEFT  . GERD (gastroesophageal reflux disease)   . Hypercholesterolemia   . Peripheral neuropathy   . Essential tremor   . Diverticulosis   . Arthritis     knees and back  . Cancer     Pancreatic  . Hypertension   . Colon polyps   . UTI (lower urinary tract infection)   . Complication of anesthesia     slow to wak up   . Pulmonary embolism     Medications:  Scheduled:  . acetaminophen  650 mg Oral Q6H  . atorvastatin  40 mg Oral q1800  . brimonidine  1 drop Left Eye BID   And  . timolol  1 drop Left Eye BID  . ciprofloxacin  400 mg Intravenous Q24H  . darbepoetin (ARANESP) injection - NON-DIALYSIS  100 mcg Subcutaneous Q Tue-1800  . enoxaparin (LOVENOX) injection  80 mg Subcutaneous Q24H  . feeding supplement (ENSURE ENLIVE)  237 mL Oral TID BM  . insulin aspart  0-9 Units Subcutaneous 4 times per day  . latanoprost  1 drop Both Eyes QHS  . magnesium sulfate 1 - 4 g bolus IVPB  1 g Intravenous Once  . pantoprazole  40 mg Oral Daily  . potassium chloride  10 mEq Intravenous Q1 Hr x 4  . potassium phosphate IVPB (mmol)  10 mmol  Intravenous Once  . primidone  50 mg Oral QHS  . sodium chloride  10-40 mL Intracatheter Q12H  . sucralfate  1 g Oral TID WC & HS  . tamsulosin  0.4 mg Oral Daily   Infusions:  . Marland KitchenTPN (CLINIMIX-E) Adult     And  . fat emulsion    . Marland KitchenTPN (CLINIMIX-E) Adult 40 mL/hr at 04/29/14 1718   And  . fat emulsion 240 mL (04/29/14 1718)   PRN: albuterol, alum & mag hydroxide-simeth, lip balm, ondansetron **OR** ondansetron (ZOFRAN) IV, sodium chloride, traMADol  Assessment: 69 y/o F with pancreatic adenocarcinoma, s/p distal pancreatectomy and splenectomy, partial colectomy 04/08/14.    Anticoagulation:  Prior to admission pt was on full-dose Lovenox (100mg  SQ q12h) for recurrent DVT; this was interrupted for surgery and resumed 3/19.    Also on 3/19 patient developed acute kidney injury with SCr 1.84 and estimated CrCl < 30 mL/min, so attending MD reduced Lovenox to 100mg  SQ q24h.  In the interim, the SCr continued rising and peaked at 3.94 on 3/23 with estimated CrCl < 20 mL/min.    Given the potential for Lovenox accumulation in this setting, an order was obtained for pharmacy to  assist with dosing.  Anti-Xa level drawn 3/25 at 1100 (six hours post-dose) 1.15, which was within the laboratory's therapeutic range of 0.5 - 1.2 but slightly above the optimal range of 0.6 -1.0.  Suboptimal draw time (six hours rather than four hours post-dose) was noted.  Dosage was initially left unchanged, but because of persistent elevation in SCr with potential for further drug accumulation, on 3/27 dosage was reduced to 90 mg SQ q24h x 1 day, then 80 mg SQ q24h.  On 3/29 Anti-Xa level therapeutic at 0.96 (note goal. Level drawn appropriately 4 hrs after dose charted as given  Antibiotic:  Patient's currently on cipro day #12 for pseudomonas subphrenic abscess  Today, 04/30/2014:  Renal function improving with scr down to 2.23 today (crcl~31), UOP 0.7  Hgb low but stable   Goal of Therapy:  Full  anticoagulation  Adjust Lovenox for renal function Anti-Xa level 0.6-1.0 unit/mL   Plan:  - continue Lovenox 80 mg SQ q24h for now - If renal function/scr continues to improve and crcl>30, will plan on adjusting lovenox dose back to 100mg  SQ q12h - Change cipro to 400 mg IV q12h   Dia Sitter, PharmD, BCPS

## 2014-04-30 NOTE — Progress Notes (Signed)
Physical Therapy Treatment Patient Details Name: [name] MRN: [id number] DOB: 15-Dec-1945 Today's Date: 04/30/2014    History of Present Illness 69 yo female s/p lap pancreatectomy, splenectomy, partial colectomy 04/08/14. hx of pancreatic cancer, glaucoma, DVT, peripheral neuropathy    PT Comments    With + 2 assist for safety, assisted pt with amb in hallway twice using her personal 4 WW.  Limited activity tolerance and Max c/o B LE and UE swelling.  Assisted back to bed per pt request.  Follow Up Recommendations  SNF     Equipment Recommendations       Recommendations for Other Services       Precautions / Restrictions Precautions Precautions: Fall Precaution Comments: abdominal surgery/2 L drains Restrictions Weight Bearing Restrictions: No    Mobility  Bed Mobility Overal bed mobility: Needs Assistance         Sit to supine: Mod assist   General bed mobility comments: Mod Assist to support b LE onto bed due to increased c/o edema  Transfers Overall transfer level: Needs assistance Equipment used: 4-wheeled walker Transfers: Sit to/from Stand Sit to Stand: Min assist         General transfer comment: Assist to rise, stabilize, control descent. Multimodal cues for safety, technique, hand placement.   Ambulation/Gait Ambulation/Gait assistance: Min assist;+2 safety/equipment Ambulation Distance (Feet): 80 Feet (40 feet x 2 one sitting rest break) Assistive device: 4-wheeled walker Gait Pattern/deviations: Step-through pattern;Decreased stride length;Trunk flexed Gait velocity: decreased   General Gait Details: + 2 assist for safety and so that recliner was following.  Max c/o B LE edema. Max c/o B UE weakness/fatigue.   Stairs            Wheelchair Mobility    Modified Rankin (Stroke Patients Only)       Balance                                    Cognition Arousal/Alertness: Awake/alert Behavior During Therapy: WFL  for tasks assessed/performed Overall Cognitive Status: Within Functional Limits for tasks assessed                      Exercises      General Comments        Pertinent Vitals/Pain Pain Assessment: Faces Faces Pain Scale: Hurts a little bit Pain Location: L side near upper drain Pain Descriptors / Indicators: Sore    Home Living                      Prior Function            PT Goals (current goals can now be found in the care plan section) Progress towards PT goals: Progressing toward goals    Frequency  Min 3X/week    PT Plan      Co-evaluation             End of Session Equipment Utilized During Treatment: Gait belt Activity Tolerance: Patient limited by fatigue Patient left: in bed;with call bell/phone within reach     Time: 1455-1515 PT Time Calculation (min) (ACUTE ONLY): 20 min  Charges:  $Gait Training: 8-22 mins                    G Codes:      Rica Koyanagi  PTA [facility]  Acute  Rehab Pager  [phone]  

## 2014-05-01 LAB — RENAL FUNCTION PANEL
ANION GAP: 6 (ref 5–15)
Albumin: 3.2 g/dL — ABNORMAL LOW (ref 3.5–5.2)
BUN: 16 mg/dL (ref 6–23)
CALCIUM: 8.3 mg/dL — AB (ref 8.4–10.5)
CO2: 25 mmol/L (ref 19–32)
Chloride: 106 mmol/L (ref 96–112)
Creatinine, Ser: 1.87 mg/dL — ABNORMAL HIGH (ref 0.50–1.10)
GFR calc non Af Amer: 27 mL/min — ABNORMAL LOW (ref >90)
GFR, EST AFRICAN AMERICAN: 31 mL/min — AB (ref >90)
Glucose, Bld: 138 mg/dL — ABNORMAL HIGH (ref 70–99)
PHOSPHORUS: 2.9 mg/dL (ref 2.3–4.6)
Potassium: 3.1 mmol/L — ABNORMAL LOW (ref 3.5–5.1)
SODIUM: 137 mmol/L (ref 135–145)

## 2014-05-01 LAB — GLUCOSE, CAPILLARY
GLUCOSE-CAPILLARY: 128 mg/dL — AB (ref 70–99)
Glucose-Capillary: 133 mg/dL — ABNORMAL HIGH (ref 70–99)
Glucose-Capillary: 154 mg/dL — ABNORMAL HIGH (ref 70–99)
Glucose-Capillary: 165 mg/dL — ABNORMAL HIGH (ref 70–99)

## 2014-05-01 LAB — BASIC METABOLIC PANEL
Anion gap: 9 (ref 5–15)
BUN: 18 mg/dL (ref 6–23)
CALCIUM: 8.6 mg/dL (ref 8.4–10.5)
CHLORIDE: 105 mmol/L (ref 96–112)
CO2: 23 mmol/L (ref 19–32)
CREATININE: 1.75 mg/dL — AB (ref 0.50–1.10)
GFR calc Af Amer: 34 mL/min — ABNORMAL LOW (ref >90)
GFR calc non Af Amer: 29 mL/min — ABNORMAL LOW (ref >90)
GLUCOSE: 139 mg/dL — AB (ref 70–99)
POTASSIUM: 3.8 mmol/L (ref 3.5–5.1)
Sodium: 137 mmol/L (ref 135–145)

## 2014-05-01 LAB — PHOSPHORUS: Phosphorus: 2.8 mg/dL (ref 2.3–4.6)

## 2014-05-01 LAB — MAGNESIUM: MAGNESIUM: 1.8 mg/dL (ref 1.5–2.5)

## 2014-05-01 MED ORDER — ENSURE PUDDING PO PUDG
1.0000 | Freq: Three times a day (TID) | ORAL | Status: DC
  Administered 2014-05-02: 1 via ORAL
  Filled 2014-05-01 (×9): qty 1

## 2014-05-01 MED ORDER — FAT EMULSION 20 % IV EMUL
240.0000 mL | INTRAVENOUS | Status: AC
  Administered 2014-05-01: 240 mL via INTRAVENOUS
  Filled 2014-05-01: qty 250

## 2014-05-01 MED ORDER — ENOXAPARIN SODIUM 100 MG/ML ~~LOC~~ SOLN
100.0000 mg | Freq: Two times a day (BID) | SUBCUTANEOUS | Status: DC
  Administered 2014-05-01 – 2014-05-03 (×4): 100 mg via SUBCUTANEOUS
  Filled 2014-05-01 (×6): qty 1

## 2014-05-01 MED ORDER — TRACE MINERALS CR-CU-F-FE-I-MN-MO-SE-ZN IV SOLN
INTRAVENOUS | Status: AC
  Administered 2014-05-01: 18:00:00 via INTRAVENOUS
  Filled 2014-05-01: qty 2400

## 2014-05-01 MED ORDER — POTASSIUM CHLORIDE 10 MEQ/100ML IV SOLN
10.0000 meq | INTRAVENOUS | Status: AC
  Administered 2014-05-01 (×6): 10 meq via INTRAVENOUS
  Filled 2014-05-01 (×8): qty 100

## 2014-05-01 NOTE — Progress Notes (Signed)
Patient ID: [name], female   DOB: 01/06/1946, 69 y.o.   MRN: [id number]     [address] [place], [place] [address]    Phone: [phone] FAX: [phone]     Subjective: CT removed drain #1 31m.  Drain #2 184m  Nausea is better since last evening.  Diarrhea has improved since CT yesterday.  VSS.  Afebrile.    Objective:  Vital signs:  Filed Vitals:   04/30/14 1326 04/30/14 2300 05/01/14 0544 05/01/14 0557  BP: 154/82 136/65 135/66   Pulse: 80 88 84   Temp: 98.5 F (36.9 C) 99 F (37.2 C) 98.9 F (37.2 C)   TempSrc: Oral Oral Oral   Resp: _0 Height:      Weight:    96.9 kg (213 lb 10 oz)  SpO2: 100% 97% 99%     Last BM Date: 04/29/14  Intake/Output   Yesterday:  04/01 0701 - 04/02 0700 In: 400 [IV PiMCNOBSJGG:836]ut: 2825 [Urine:2750; Drains:75] This shift: I/O last 3 completed shifts: In: 400 [IV [id number]:503]ut: 395465Urine:3900; Drains:85]      Physical Exam: General: Pt awake/alert/oriented x4 in no acute distress Chest: clear. No chest wall pain w good excursion CV: Pulses intact. Regular rhythm MS: Normal AROM mjr joints. No obvious deformity Abdomen: Soft. Nondistended. Mildly tender at incisions only. Drain #1 brown oupput. Drain #2--serous output. Incisions are c/d/i No evidence of peritonitis. No incarcerated hernias. Ext: SCDs BLE. BLE edema. 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 Active Problems:   Carcinoma of tail of pancreas   Abscess of abdominal cavity   Malnutrition of moderate degree   Intra-abdominal abscess    Results:   Labs: Results for orders placed or performed during the hospital encounter of 04/08/14 (from the past 48 hour(s))  Urinalysis, Routine w reflex microscopic     Status: Abnormal    Collection Time: 04/29/14 10:34 AM  Result Value Ref Range   Color, Urine YELLOW YELLOW   APPearance CLEAR CLEAR   Specific Gravity, Urine 1.010 1.005 - 1.030   pH 6.0 5.0 - 8.0   Glucose, UA NEGATIVE NEGATIVE mg/dL   Hgb urine dipstick SMALL (A) NEGATIVE   Bilirubin Urine NEGATIVE NEGATIVE   Ketones, ur 15 (A) NEGATIVE mg/dL   Protein, ur NEGATIVE NEGATIVE mg/dL   Urobilinogen, UA 0.2 0.0 - 1.0 mg/dL   Nitrite NEGATIVE NEGATIVE   Leukocytes, UA SMALL (A) NEGATIVE  Urine microscopic-add on     Status: None   Collection Time: 04/29/14 10:34 AM  Result Value Ref Range   Squamous Epithelial / LPF RARE RARE   WBC, UA 11-20 <3 WBC/hpf   RBC / HPF 3-6 <3 RBC/hpf   Bacteria, UA RARE RARE  Magnesium     Status: Abnormal   Collection Time: 04/29/14  2:17 PM  Result Value Ref Range   Magnesium 1.4 (L) 1.5 - 2.5 mg/dL  Glucose, capillary     Status: None   Collection Time: 04/29/14  3:11 PM  Result Value Ref Range   Glucose-Capillary 85 70 - 99 mg/dL  Glucose, capillary     Status: Abnormal   Collection Time: 04/29/14  4:59 PM  Result Value Ref Range   Glucose-Capillary 100 (H) 70 - 99 mg/dL  Glucose,  capillary     Status: Abnormal   Collection Time: 04/29/14 11:32 PM  Result Value Ref Range   Glucose-Capillary 121 (H) 70 - 99 mg/dL   Comment 1 Notify RN   Triglycerides     Status: None   Collection Time: 04/30/14  4:30 AM  Result Value Ref Range   Triglycerides 82 <150 mg/dL    Comment: Performed at [facility]  Magnesium     Status: None   Collection Time: 04/30/14  4:50 AM  Result Value Ref Range   Magnesium 1.8 1.5 - 2.5 mg/dL  CBC     Status: Abnormal   Collection Time: 04/30/14  4:50 AM  Result Value Ref Range   WBC 10.3 4.0 - 10.5 K/uL   RBC 2.67 (L) 3.87 - 5.11 MIL/uL   Hemoglobin 7.9 (L) 12.0 - 15.0 g/dL   HCT 24.7 (L) 36.0 - 46.0 %   MCV 92.5 78.0 - 100.0 fL   MCH 29.6 26.0 - 34.0 pg   MCHC 32.0 30.0 - 36.0 g/dL   RDW 15.3 11.5 - 15.5 %   Platelets  462 (H) 150 - 400 K/uL  Differential     Status: Abnormal   Collection Time: 04/30/14  4:50 AM  Result Value Ref Range   Neutrophils Relative % 63 43 - 77 %   Neutro Abs 6.6 1.7 - 7.7 K/uL   Lymphocytes Relative 20 12 - 46 %   Lymphs Abs 2.0 0.7 - 4.0 K/uL   Monocytes Relative 13 (H) 3 - 12 %   Monocytes Absolute 1.4 (H) 0.1 - 1.0 K/uL   Eosinophils Relative 3 0 - 5 %   Eosinophils Absolute 0.3 0.0 - 0.7 K/uL   Basophils Relative 1 0 - 1 %   Basophils Absolute 0.1 0.0 - 0.1 K/uL  Comprehensive metabolic panel     Status: Abnormal   Collection Time: 04/30/14  4:50 AM  Result Value Ref Range   Sodium 139 135 - 145 mmol/L   Potassium 3.0 (L) 3.5 - 5.1 mmol/L   Chloride 108 96 - 112 mmol/L   CO2 24 19 - 32 mmol/L   Glucose, Bld 127 (H) 70 - 99 mg/dL   BUN 14 6 - 23 mg/dL   Creatinine, Ser 2.23 (H) 0.50 - 1.10 mg/dL   Calcium 8.6 8.4 - 10.5 mg/dL   Total Protein 6.0 6.0 - 8.3 g/dL   Albumin 3.0 (L) 3.5 - 5.2 g/dL   AST 29 0 - 37 U/L   ALT 10 0 - 35 U/L   Alkaline Phosphatase 65 39 - 117 U/L   Total Bilirubin 0.3 0.3 - 1.2 mg/dL   GFR calc non Af Amer 22 (L) >90 mL/min   GFR calc Af Amer 25 (L) >90 mL/min    Comment: (NOTE) The eGFR has been calculated using the CKD EPI equation. This calculation has not been validated in all clinical situations. eGFR's persistently <90 mL/min signify possible Chronic Kidney Disease.    Anion gap 7 5 - 15  Phosphorus     Status: None   Collection Time: 04/30/14  4:50 AM  Result Value Ref Range   Phosphorus 2.8 2.3 - 4.6 mg/dL  Glucose, capillary     Status: Abnormal   Collection Time: 04/30/14  5:48 AM  Result Value Ref Range   Glucose-Capillary 125 (H) 70 - 99 mg/dL   Comment 1 Notify RN   Glucose, capillary     Status: Abnormal   Collection Time:  04/30/14 11:46 AM  Result Value Ref Range   Glucose-Capillary 124 (H) 70 - 99 mg/dL  Glucose, capillary     Status: Abnormal   Collection Time: 04/30/14  5:58 PM  Result Value Ref Range    Glucose-Capillary 128 (H) 70 - 99 mg/dL  Glucose, capillary     Status: Abnormal   Collection Time: 05/01/14 12:02 AM  Result Value Ref Range   Glucose-Capillary 165 (H) 70 - 99 mg/dL  Renal function panel     Status: Abnormal   Collection Time: 05/01/14  5:00 AM  Result Value Ref Range   Sodium 137 135 - 145 mmol/L   Potassium 3.1 (L) 3.5 - 5.1 mmol/L   Chloride 106 96 - 112 mmol/L   CO2 25 19 - 32 mmol/L   Glucose, Bld 138 (H) 70 - 99 mg/dL   BUN 16 6 - 23 mg/dL   Creatinine, Ser 1.87 (H) 0.50 - 1.10 mg/dL   Calcium 8.3 (L) 8.4 - 10.5 mg/dL   Phosphorus 2.9 2.3 - 4.6 mg/dL   Albumin 3.2 (L) 3.5 - 5.2 g/dL   GFR calc non Af Amer 27 (L) >90 mL/min   GFR calc Af Amer 31 (L) >90 mL/min    Comment: (NOTE) The eGFR has been calculated using the CKD EPI equation. This calculation has not been validated in all clinical situations. eGFR's persistently <90 mL/min signify possible Chronic Kidney Disease.    Anion gap 6 5 - 15  Magnesium     Status: None   Collection Time: 05/01/14  5:00 AM  Result Value Ref Range   Magnesium 1.8 1.5 - 2.5 mg/dL  Phosphorus     Status: None   Collection Time: 05/01/14  5:00 AM  Result Value Ref Range   Phosphorus 2.8 2.3 - 4.6 mg/dL  Glucose, capillary     Status: Abnormal   Collection Time: 05/01/14  5:50 AM  Result Value Ref Range   Glucose-Capillary 128 (H) 70 - 99 mg/dL    Imaging / Studies: Ct Abdomen Pelvis Wo Contrast  04/29/2014   CLINICAL DATA:  Intermittent nausea and vomiting with poor appetite and left upper quadrant soreness. History of distal pancreatectomy/ splenectomy and partial colectomy. Subsequent encounter.  EXAM: CT ABDOMEN AND PELVIS WITHOUT CONTRAST  TECHNIQUE: Multidetector CT imaging of the abdomen and pelvis was performed following the standard protocol without IV contrast.  COMPARISON:  04/24/2014.  MR abdomen 01/05/2014.  FINDINGS: Lower chest: Lung bases show a moderate simple appearing left pleural effusion with likely  compressive atelectasis in the left lower lobe. Heart size within normal limits. Decreased attenuation of the intravascular compartment is indicative of anemia. No pericardial effusion.  Hepatobiliary: A lobulated low-attenuation lesion in the inferior right hepatic lobe measures 2.5 cm, stable, characterized as a cyst on 01/05/2014. Liver is otherwise unremarkable. Cholecystectomy. Extrahepatic bile duct measures 1.4 cm, stable.  Pancreas: Status post distal pancreatectomy with a focal low-attenuation lesion at the surgical margin, measuring 2.7 x 3.0 cm, grossly stable in size. Pancreas is otherwise unremarkable.  Spleen: Surgically absent.  Adrenals/Urinary Tract: Adrenal glands and right kidney are unremarkable. Low-attenuation lesion in the interpolar left kidney measures 1.5 cm, stable and characterized as a cyst on 01/05/2014. Ureters are decompressed. Air in the bladder is presumably iatrogenic. Bladder wall may be slightly thickened.  Stomach/Bowel: Stomach, small bowel and proximal colon are unremarkable. An anastomosis is seen at the splenic flexure. Colon is otherwise unremarkable.  Vascular/Lymphatic: Atherosclerotic calcification of the arterial vasculature without  abdominal aortic aneurysm. No pathologically enlarged lymph nodes. IVC filter is in place.  Reproductive: Hysterectomy.  Ovaries are visualized.  Other: Small pelvic free fluid. 2 percutaneous drains terminate in the left upper quadrant, along the posterior margin of the stomach and anterior margin of the left kidney. No definite associated fluid collection. Upper abdominal midline ventral hernia contains fat, small in size. Presumed subcutaneous injection densities and air in the ventral low abdomen and pelvis. Mesenteries and peritoneum are otherwise unremarkable.  Musculoskeletal: No worrisome lytic or sclerotic lesions.  IMPRESSION: 1. Postoperative changes of distal pancreatectomy with stable pseudocyst or seroma adjacent to the  surgical margin. 2. Resolved left upper quadrant abscess with 2 percutaneous drains in place. 3. Small pelvic free fluid. 4. Moderate left pleural effusion with likely compressive atelectasis in the left lower lobe. Difficult to definitively exclude pneumonia.   Electronically Signed   By: Lorin Picket M.D.   On: 04/29/2014 15:25    Medications / Allergies:  Scheduled Meds: . acetaminophen  650 mg Oral Q6H  . atorvastatin  40 mg Oral q1800  . brimonidine  1 drop Left Eye BID   And  . timolol  1 drop Left Eye BID  . ciprofloxacin  400 mg Intravenous Q12H  . darbepoetin (ARANESP) injection - NON-DIALYSIS  100 mcg Subcutaneous Q Tue-1800  . enoxaparin (LOVENOX) injection  100 mg Subcutaneous Q12H  . feeding supplement (ENSURE)  1 Container Oral TID BM  . insulin aspart  0-9 Units Subcutaneous 4 times per day  . latanoprost  1 drop Both Eyes QHS  . pantoprazole  40 mg Oral Daily  . potassium chloride  10 mEq Intravenous Q1 Hr x 6  . primidone  50 mg Oral QHS  . sodium chloride  10-40 mL Intracatheter Q12H  . sucralfate  1 g Oral TID WC & HS  . tamsulosin  0.4 mg Oral Daily   Continuous Infusions: . Marland KitchenTPN (CLINIMIX-E) Adult 70 mL/hr at 04/30/14 1740   And  . fat emulsion 240 mL (04/30/14 1740)  . Marland KitchenTPN (CLINIMIX-E) Adult     And  . fat emulsion     PRN Meds:.albuterol, alum & mag hydroxide-simeth, lip balm, ondansetron **OR** ondansetron (ZOFRAN) IV, sodium chloride, traMADol  Antibiotics: Anti-infectives    Start     Dose/Rate Route Frequency Ordered Stop   04/30/14 2146  ciprofloxacin (CIPRO) IVPB 400 mg     400 mg 200 mL/hr over 60 Minutes Intravenous Every 12 hours 04/30/14 1106     04/27/14 1000  ciprofloxacin (CIPRO) IVPB 400 mg  Status:  Discontinued     400 mg 200 mL/hr over 60 Minutes Intravenous Every 24 hours 04/26/14 1038 04/30/14 1106   04/21/14 1000  ciprofloxacin (CIPRO) tablet 500 mg  Status:  Discontinued     500 mg Oral Every 24 hours 04/21/14 0849 04/26/14  1038   04/20/14 1400  piperacillin-tazobactam (ZOSYN) IVPB 2.25 g  Status:  Discontinued     2.25 g 100 mL/hr over 30 Minutes Intravenous Every 6 hours 04/20/14 0713 04/21/14 0849   04/15/14 1800  vancomycin (VANCOCIN) 1,250 mg in sodium chloride 0.9 % 250 mL IVPB  Status:  Discontinued     1,250 mg 166.7 mL/hr over 90 Minutes Intravenous Every 12 hours 04/15/14 0801 04/17/14 0643   04/15/14 1500  piperacillin-tazobactam (ZOSYN) IVPB 3.375 g  Status:  Discontinued     3.375 g 12.5 mL/hr over 240 Minutes Intravenous 3 times per day 04/15/14 0801 04/20/14 1316  04/15/14 0900  vancomycin (VANCOCIN) 1,500 mg in sodium chloride 0.9 % 500 mL IVPB     1,500 mg 250 mL/hr over 120 Minutes Intravenous  Once 04/15/14 0801 04/15/14 1231   04/15/14 0830  piperacillin-tazobactam (ZOSYN) IVPB 3.375 g     3.375 g 100 mL/hr over 30 Minutes Intravenous  Once 04/15/14 0801 04/15/14 0928   04/08/14 1600  cefoTEtan (CEFOTAN) 2 g in dextrose 5 % 50 mL IVPB     2 g 100 mL/hr over 30 Minutes Intravenous Every 12 hours 04/08/14 1522 04/09/14 0530   04/08/14 0537  ceFAZolin (ANCEF) IVPB 2 g/50 mL premix     2 g 100 mL/hr over 30 Minutes Intravenous On call to O.R. 04/08/14 0223 04/08/14 0759       Assessment/Plan Carcinoma at the tail of the pancreas POD#23 robotic assisted converted to laparoscopic hand assisted distal pancreatectomy, splenectomy with partial colectomy---Dr. Barry Dienes -diarrhea has decreased, no vomiting, nausea improved.  CT abdomen and pelvis 3/31 shows resolved abscess, no other signigicant findings.  Appetite remains poor, but tolerating a bit more liquids now.    Tramadol changed to PRN 3/31, Protonix added as well as Carafate with some improvement in nausea.  Unable to do Reglan due to diarrhea.  Don't want to give imodium due to nausea.  C diff neg previously.  May need to recheck.     -give fulls, ensure supplements,TPN until she is able to tolerate POs better.   -SCD/lovenox -mobilize,  needs to increase her activity, PT eval and treat LUQ subphrenic abscess -resolved on CT 3/31. Grew few pseudomonas  -c/w cipro IV D#10.  Will stop antibiotics after today  Hypokalemia -23mq K today, repeat BMP in AM Urinary retention -Foley replaced -Cont flomax -Pt with h/o urinary retention.  May need eval by GU Tremors  -primidone Acute renal failure---non oliguric ATN -renal following at distance, appreciate assistance -improving Cr DVT/PE -lovenox per pharmacy, has an IVC filter Anemia -hgb stable at 7.9, monitor Dispo--continue inpatient, await resolution N/V. Renal function is better, can be followed on OP basis per nephrology.  [facility]  [facility]   05/01/2014 9:54 AM

## 2014-05-01 NOTE — Progress Notes (Signed)
PARENTERAL NUTRITION CONSULT NOTE - INITIAL  Pharmacy Consult for TPN Indication: not tolerating enteral feeding  Allergies  Allergen Reactions  . Sulfa Antibiotics Swelling    Patient Measurements: Height: 5' 9.5" (176.5 cm) Weight: 213 lb 10 oz (96.9 kg) IBW/kg (Calculated) : 67.35 Adjusted Body Weight: 74 kg Usual body weight: 100 kg  Vital Signs: Temp: 98.9 F (37.2 C) (04/02 0544) Temp Source: Oral (04/02 0544) BP: 135/66 mmHg (04/02 0544) Pulse Rate: 84 (04/02 0544) Intake/Output from previous day: 04/01 0701 - 04/02 0700 In: 400 [IV Piggyback:400] Out: 2825 [Urine:2750; Drains:75] Intake/Output from this shift:    Labs:  Recent Labs  04/30/14 0450  WBC 10.3  HGB 7.9*  HCT 24.7*  PLT 462*     Recent Labs  04/29/14 0420 04/29/14 1417 04/30/14 0430 04/30/14 0450 05/01/14 0500  NA 142  --   --  139 137  K 3.3*  --   --  3.0* 3.1*  CL 109  --   --  108 106  CO2 20  --   --  24 25  GLUCOSE 93  --   --  127* 138*  BUN 17  --   --  14 16  CREATININE 2.67*  --   --  2.23* 1.87*  CALCIUM 8.9  --   --  8.6 8.3*  MG  --  1.4*  --  1.8 1.8  PHOS 3.7  --   --  2.8 2.9  2.8  PROT  --   --   --  6.0  --   ALBUMIN 3.4*  --   --  3.0* 3.2*  AST  --   --   --  29  --   ALT  --   --   --  10  --   ALKPHOS  --   --   --  65  --   BILITOT  --   --   --  0.3  --   TRIG  --   --  82  --   --    Estimated Creatinine Clearance: 36.5 mL/min (by C-G formula based on Cr of 1.87).    Recent Labs  04/30/14 1758 05/01/14 0002 05/01/14 0550  GLUCAP 128* 165* 128*    Insulin Requirements: Sensitive SSI q6h (5 units/ last 24 hrs)  Current Nutrition:  Diet: full liquid diet Supplements: Ensure 237 TID (provides each provides 350 kcal, 20g protein) TPN: Clinimix at 70 ml/hr   IVF: none  Central access: PICC placed 04/18/14 TPN start date: 04/29/14  ASSESSMENT                                                                                                           Patient is a 69 y.o F with pancreatic cancer s/p pancreatectomy and splenectomy, partial colectomy on 3/10.  She has poor oral intake with resource/ensure started for on 3/29.  She now has n/v and not able to keep anything down.  To start TPN while patient has poor oral intake.  HPI: pancreatic  cancer, DVT, PE, HLP, GERD, HTN  Significant events:  3/10: distal pancreatectomy and splenectomy, partial colectomy 3/18: drainage of L upper abd fluid collection/abscesses with perc drain pacement 3/31 TPN start.  CT shows resolution of abscess.  Today:    Glucose (cbgs goal<150): range 124-165, controlled on SSI  Electrolytes: K 3.1 (s/p 4 runs kcl and Kphos 57mmol on 4/1).  Other WNL including phos 2.9, Mag 1.8, Na 137, Corr Ca 8.94  Renal: AKI improving, SCr 1.87 (CrCl~36), UOP 1.18 ml/kg/hr  LFTs: wnl (4/1)  TGs: 82 (4/1)  Prealbumin: pending  NUTRITIONAL GOALS                                                                                             4/1 RD recs: Kcal: 1800-2000, Protein: 120-130 g, Fluid: 1.8 L/day  Based on RD's recommendations, will adjust Clinimix E 5/15 goal rate to 100 ml/hr + 20% fat emulsion at 10 ml/hr to provide: 120 g/day protein, 2184 Kcal/day (kcal/day content will be slightly higher than RD's recommendations as clinimix bags come in standard concentrations and we are not able to adjust kcal content in them)  PLAN                                                                                                                          KCl 10 mEq IV x 6 runs.  Recheck at 1800. At 1800 today:  Increase Clinimix E 5/15 rate to 100 ml/hr.  20% fat emulsion at 10 ml/hr.  Plan to advance as tolerated to the goal rate.  TPN to contain standard multivitamins and trace elements.  Continue CBGs and sensitive SSI q6h .   TPN lab panels on Mondays & Thursdays.  Anticipate mag and phos may trend down with improving renal function - recheck mag and phos  in [place] PharmD, BCPS Pager [phone] 05/01/2014 8:13 AM

## 2014-05-01 NOTE — Progress Notes (Signed)
[place] for Lovenox Indication: h/o recurrent DVT   Allergies  Allergen Reactions  . Sulfa Antibiotics Swelling    Patient Measurements: Height: 5' 9.5" (176.5 cm) Weight: 213 lb 10 oz (96.9 kg) IBW/kg (Calculated) : 67.35   Vital Signs: Temp: 98.9 F (37.2 C) (04/02 0544) Temp Source: Oral (04/02 0544) BP: 135/66 mmHg (04/02 0544) Pulse Rate: 84 (04/02 0544)  Labs:  Recent Labs  04/29/14 0420 04/30/14 0450 05/01/14 0500  HGB  --  7.9*  --   HCT  --  24.7*  --   PLT  --  462*  --   CREATININE 2.67* 2.23* 1.87*    Estimated Creatinine Clearance: 36.5 mL/min (by C-G formula based on Cr of 1.87).   Medications:  Scheduled:  . acetaminophen  650 mg Oral Q6H  . atorvastatin  40 mg Oral q1800  . brimonidine  1 drop Left Eye BID   And  . timolol  1 drop Left Eye BID  . ciprofloxacin  400 mg Intravenous Q12H  . darbepoetin (ARANESP) injection - NON-DIALYSIS  100 mcg Subcutaneous Q Tue-1800  . enoxaparin (LOVENOX) injection  80 mg Subcutaneous Q24H  . feeding supplement (ENSURE ENLIVE)  237 mL Oral TID BM  . insulin aspart  0-9 Units Subcutaneous 4 times per day  . latanoprost  1 drop Both Eyes QHS  . pantoprazole  40 mg Oral Daily  . potassium chloride  10 mEq Intravenous Q1 Hr x 6  . primidone  50 mg Oral QHS  . sodium chloride  10-40 mL Intracatheter Q12H  . sucralfate  1 g Oral TID WC & HS  . tamsulosin  0.4 mg Oral Daily   Infusions:  . Marland KitchenTPN (CLINIMIX-E) Adult 70 mL/hr at 04/30/14 1740   And  . fat emulsion 240 mL (04/30/14 1740)  . Marland KitchenTPN (CLINIMIX-E) Adult     And  . fat emulsion      Assessment: 69 y/o F with pancreatic adenocarcinoma, s/p distal pancreatectomy and splenectomy, partial colectomy 04/08/14.  PMH includes recurrent DVT on full-dose Lovenox (100mg  SQ q12h) prior to admission.  Lovenox was held for surgery, then resumed 3/19.  Also on 3/19 patient developed acute kidney injury with SCr 1.84 and  estimated CrCl < 30 mL/min, so attending MD reduced Lovenox to 100mg  SQ q24h.  In the interim, the SCr continued rising and peaked at 3.94 on 3/23 with estimated CrCl < 20 mL/min.  Given the potential for Lovenox accumulation in this setting, an order was obtained for pharmacy to assist with dosing.  Anti-Xa levels  3/25 at 1100 (six hours post-dose) = 1.15, which was within the laboratory's therapeutic range of 0.5 - 1.2 but slightly above the optimal range of 0.6 -1.0.  Suboptimal draw time (six hours rather than four hours post-dose) was noted.  Dosage was initially left unchanged, but because of persistent elevation in SCr with potential for further drug accumulation, on 3/27 dosage was reduced to 90 mg SQ q24h x 1 day, then 80 mg SQ q24h.  3/29 (4 hours post-dos) = 0.96, therapeutic.  Continue on Lovenox 80mg  SQ q24h  Today, 05/01/2014:  Lovenox 80 mg SQ q24h  Renal function: improving with SCr 1.87, CrCl ~ 36 ml/min  CBC: Hgb 7.9 (low, stable), and Plt elevated (4/1)  No bleeding reported in chart note.  Goal of Therapy:  Full anticoagulation, Lovenox adjusted for renal function Anti-Xa level 0.6-1.0 unit/mL   Plan:   Increase to Lovenox 100  mg SQ q12h  Recheck anti-Xa level on home dose, 4 hours after 4th dose.  Follow renal function, CBC  Gretta Arab PharmD, BCPS Pager [phone] 05/01/2014 9:30 AM

## 2014-05-02 LAB — GLUCOSE, CAPILLARY
GLUCOSE-CAPILLARY: 142 mg/dL — AB (ref 70–99)
GLUCOSE-CAPILLARY: 152 mg/dL — AB (ref 70–99)
Glucose-Capillary: 103 mg/dL — ABNORMAL HIGH (ref 70–99)
Glucose-Capillary: 139 mg/dL — ABNORMAL HIGH (ref 70–99)

## 2014-05-02 LAB — RENAL FUNCTION PANEL
ANION GAP: 7 (ref 5–15)
Albumin: 3.2 g/dL — ABNORMAL LOW (ref 3.5–5.2)
BUN: 21 mg/dL (ref 6–23)
CALCIUM: 8.6 mg/dL (ref 8.4–10.5)
CO2: 25 mmol/L (ref 19–32)
Chloride: 106 mmol/L (ref 96–112)
Creatinine, Ser: 1.7 mg/dL — ABNORMAL HIGH (ref 0.50–1.10)
GFR, EST AFRICAN AMERICAN: 35 mL/min — AB (ref >90)
GFR, EST NON AFRICAN AMERICAN: 30 mL/min — AB (ref >90)
Glucose, Bld: 150 mg/dL — ABNORMAL HIGH (ref 70–99)
PHOSPHORUS: 3.3 mg/dL (ref 2.3–4.6)
Potassium: 3.9 mmol/L (ref 3.5–5.1)
Sodium: 138 mmol/L (ref 135–145)

## 2014-05-02 LAB — MAGNESIUM: Magnesium: 1.6 mg/dL (ref 1.5–2.5)

## 2014-05-02 MED ORDER — LOPERAMIDE HCL 2 MG PO CAPS
2.0000 mg | ORAL_CAPSULE | Freq: Three times a day (TID) | ORAL | Status: DC
  Administered 2014-05-02 – 2014-05-04 (×7): 2 mg via ORAL
  Filled 2014-05-02 (×11): qty 1

## 2014-05-02 MED ORDER — MAGNESIUM SULFATE IN D5W 10-5 MG/ML-% IV SOLN
1.0000 g | Freq: Once | INTRAVENOUS | Status: AC
  Administered 2014-05-02: 1 g via INTRAVENOUS
  Filled 2014-05-02: qty 100

## 2014-05-02 MED ORDER — TRACE MINERALS CR-CU-F-FE-I-MN-MO-SE-ZN IV SOLN: INTRAVENOUS | Status: DC

## 2014-05-02 MED ORDER — FAT EMULSION 20 % IV EMUL: 240.0000 mL | INTRAVENOUS | Status: DC

## 2014-05-02 NOTE — Progress Notes (Signed)
Patient ID: [name], female   DOB: 1945/02/21, 69 y.o.   MRN: [id number]     [address] [place], [place] [address]    Phone: [phone] FAX: [phone]     Subjective: Pt tolerating a soft diet and denies any further nausea.  Still having loose stools.    Objective:  Vital signs:  Filed Vitals:   05/01/14 1341 05/01/14 2155 05/02/14 0500 05/02/14 0611  BP: 133/68 136/74  148/79  Pulse: 89 102  96  Temp: 98.8 F (37.1 C) 98.8 F (37.1 C)  99 F (37.2 C)  TempSrc: Oral Oral  Oral  Resp: _0 Height:      Weight:   96.1 kg (211 lb 13.8 oz)   SpO2: 97% 100%  100%    Last BM Date: 04/29/14  Intake/Output   Yesterday:  04/02 0701 - 04/03 0700 In: 2520 [P.O.:1200; TPN:1320] Out: 5100 [Urine:5050; Drains:50] This shift: I/O last 3 completed shifts: In: 2520 [P.O.:1200] Out: 2248 [Urine:6250; Drains:105]      Physical Exam: General: Pt awake/alert/oriented x4 in no acute distress Chest: clear. No chest wall pain w good excursion CV: Pulses intact. Regular rhythm MS: Normal AROM mjr joints. No obvious deformity Abdomen: Soft. Nondistended. Mildly tender at incisions only. Drain #1 brown oupput. Drain #2--serous output. Incisions are c/d/i No evidence of peritonitis. No incarcerated hernias. Ext: SCDs BLE. BLE edema. 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 Active Problems:   Carcinoma of tail of pancreas   Abscess of abdominal cavity   Malnutrition of moderate degree   Intra-abdominal abscess    Results:   Labs: Results for orders placed or performed during the hospital encounter of 04/08/14 (from the past 48 hour(s))  Glucose, capillary     Status: Abnormal   Collection Time: 04/30/14 11:46 AM  Result Value Ref Range    Glucose-Capillary 124 (H) 70 - 99 mg/dL  Glucose, capillary     Status: Abnormal   Collection Time: 04/30/14  5:58 PM  Result Value Ref Range   Glucose-Capillary 128 (H) 70 - 99 mg/dL  Glucose, capillary     Status: Abnormal   Collection Time: 05/01/14 12:02 AM  Result Value Ref Range   Glucose-Capillary 165 (H) 70 - 99 mg/dL  Renal function panel     Status: Abnormal   Collection Time: 05/01/14  5:00 AM  Result Value Ref Range   Sodium 137 135 - 145 mmol/L   Potassium 3.1 (L) 3.5 - 5.1 mmol/L   Chloride 106 96 - 112 mmol/L   CO2 25 19 - 32 mmol/L   Glucose, Bld 138 (H) 70 - 99 mg/dL   BUN 16 6 - 23 mg/dL   Creatinine, Ser 1.87 (H) 0.50 - 1.10 mg/dL   Calcium 8.3 (L) 8.4 - 10.5 mg/dL   Phosphorus 2.9 2.3 - 4.6 mg/dL   Albumin 3.2 (L) 3.5 - 5.2 g/dL   GFR calc non Af Amer 27 (L) >90 mL/min   GFR calc Af Amer 31 (L) >90 mL/min    Comment: (NOTE) The eGFR has been calculated using the CKD EPI equation. This calculation has not been validated in all clinical situations. eGFR's persistently <90 mL/min signify possible Chronic Kidney Disease.    Anion gap 6 5 - 15  Magnesium     Status: None   Collection Time: 05/01/14  5:00 AM  Result Value Ref Range   Magnesium 1.8 1.5 - 2.5 mg/dL  Phosphorus     Status: None   Collection Time: 05/01/14  5:00 AM  Result Value Ref Range   Phosphorus 2.8 2.3 - 4.6 mg/dL  Glucose, capillary     Status: Abnormal   Collection Time: 05/01/14  5:50 AM  Result Value Ref Range   Glucose-Capillary 128 (H) 70 - 99 mg/dL  Glucose, capillary     Status: Abnormal   Collection Time: 05/01/14 12:06 PM  Result Value Ref Range   Glucose-Capillary 133 (H) 70 - 99 mg/dL  Glucose, capillary     Status: Abnormal   Collection Time: 05/01/14  5:48 PM  Result Value Ref Range   Glucose-Capillary 154 (H) 70 - 99 mg/dL  Basic metabolic panel     Status: Abnormal   Collection Time: 05/01/14  5:50 PM  Result Value Ref Range   Sodium 137 135 - 145 mmol/L    Potassium 3.8 3.5 - 5.1 mmol/L    Comment: RESULT REPEATED AND VERIFIED DELTA CHECK NOTED    Chloride 105 96 - 112 mmol/L   CO2 23 19 - 32 mmol/L   Glucose, Bld 139 (H) 70 - 99 mg/dL   BUN 18 6 - 23 mg/dL   Creatinine, Ser 1.75 (H) 0.50 - 1.10 mg/dL   Calcium 8.6 8.4 - 10.5 mg/dL   GFR calc non Af Amer 29 (L) >90 mL/min   GFR calc Af Amer 34 (L) >90 mL/min    Comment: (NOTE) The eGFR has been calculated using the CKD EPI equation. This calculation has not been validated in all clinical situations. eGFR's persistently <90 mL/min signify possible Chronic Kidney Disease.    Anion gap 9 5 - 15  Glucose, capillary     Status: Abnormal   Collection Time: 05/01/14 11:57 PM  Result Value Ref Range   Glucose-Capillary 139 (H) 70 - 99 mg/dL  Glucose, capillary     Status: Abnormal   Collection Time: 05/02/14  6:01 AM  Result Value Ref Range   Glucose-Capillary 142 (H) 70 - 99 mg/dL  Renal function panel     Status: Abnormal   Collection Time: 05/02/14  6:15 AM  Result Value Ref Range   Sodium 138 135 - 145 mmol/L   Potassium 3.9 3.5 - 5.1 mmol/L   Chloride 106 96 - 112 mmol/L   CO2 25 19 - 32 mmol/L   Glucose, Bld 150 (H) 70 - 99 mg/dL   BUN 21 6 - 23 mg/dL   Creatinine, Ser 1.70 (H) 0.50 - 1.10 mg/dL   Calcium 8.6 8.4 - 10.5 mg/dL   Phosphorus 3.3 2.3 - 4.6 mg/dL   Albumin 3.2 (L) 3.5 - 5.2 g/dL   GFR calc non Af Amer 30 (L) >90 mL/min   GFR calc Af Amer 35 (L) >90 mL/min    Comment: (NOTE) The eGFR has been calculated using the CKD EPI equation. This calculation has not been validated in all clinical situations. eGFR's persistently <90 mL/min signify possible Chronic Kidney Disease.    Anion gap 7 5 - 15  Magnesium     Status: None   Collection Time: 05/02/14  6:15 AM  Result Value Ref Range   Magnesium 1.6 1.5 - 2.5 mg/dL    Imaging / Studies: No results found.  Medications / Allergies:  Scheduled Meds: . acetaminophen  650 mg  Oral Q6H  . atorvastatin  40 mg Oral  q1800  . brimonidine  1 drop Left Eye BID   And  . timolol  1 drop Left Eye BID  . darbepoetin (ARANESP) injection - NON-DIALYSIS  100 mcg Subcutaneous Q Tue-1800  . enoxaparin (LOVENOX) injection  100 mg Subcutaneous Q12H  . feeding supplement (ENSURE)  1 Container Oral TID BM  . insulin aspart  0-9 Units Subcutaneous 4 times per day  . latanoprost  1 drop Both Eyes QHS  . magnesium sulfate 1 - 4 g bolus IVPB  1 g Intravenous Once  . pantoprazole  40 mg Oral Daily  . primidone  50 mg Oral QHS  . sodium chloride  10-40 mL Intracatheter Q12H  . sucralfate  1 g Oral TID WC & HS  . tamsulosin  0.4 mg Oral Daily   Continuous Infusions: . Marland KitchenTPN (CLINIMIX-E) Adult 100 mL/hr at 05/01/14 1759   And  . fat emulsion 240 mL (05/01/14 1759)   PRN Meds:.albuterol, alum & mag hydroxide-simeth, lip balm, ondansetron **OR** ondansetron (ZOFRAN) IV, sodium chloride, traMADol  Antibiotics: Anti-infectives    Start     Dose/Rate Route Frequency Ordered Stop   04/30/14 2146  ciprofloxacin (CIPRO) IVPB 400 mg     400 mg 200 mL/hr over 60 Minutes Intravenous Every 12 hours 04/30/14 1106 05/01/14 2204   04/27/14 1000  ciprofloxacin (CIPRO) IVPB 400 mg  Status:  Discontinued     400 mg 200 mL/hr over 60 Minutes Intravenous Every 24 hours 04/26/14 1038 04/30/14 1106   04/21/14 1000  ciprofloxacin (CIPRO) tablet 500 mg  Status:  Discontinued     500 mg Oral Every 24 hours 04/21/14 0849 04/26/14 1038   04/20/14 1400  piperacillin-tazobactam (ZOSYN) IVPB 2.25 g  Status:  Discontinued     2.25 g 100 mL/hr over 30 Minutes Intravenous Every 6 hours 04/20/14 0713 04/21/14 0849   04/15/14 1800  vancomycin (VANCOCIN) 1,250 mg in sodium chloride 0.9 % 250 mL IVPB  Status:  Discontinued     1,250 mg 166.7 mL/hr over 90 Minutes Intravenous Every 12 hours 04/15/14 0801 04/17/14 0643   04/15/14 1500  piperacillin-tazobactam (ZOSYN) IVPB 3.375 g  Status:  Discontinued     3.375 g 12.5 mL/hr over 240 Minutes  Intravenous 3 times per day 04/15/14 0801 04/20/14 1316   04/15/14 0900  vancomycin (VANCOCIN) 1,500 mg in sodium chloride 0.9 % 500 mL IVPB     1,500 mg 250 mL/hr over 120 Minutes Intravenous  Once 04/15/14 0801 04/15/14 1231   04/15/14 0830  piperacillin-tazobactam (ZOSYN) IVPB 3.375 g     3.375 g 100 mL/hr over 30 Minutes Intravenous  Once 04/15/14 0801 04/15/14 0928   04/08/14 1600  cefoTEtan (CEFOTAN) 2 g in dextrose 5 % 50 mL IVPB     2 g 100 mL/hr over 30 Minutes Intravenous Every 12 hours 04/08/14 1522 04/09/14 0530   04/08/14 0537  ceFAZolin (ANCEF) IVPB 2 g/50 mL premix     2 g 100 mL/hr over 30 Minutes Intravenous On call to O.R. 04/08/14 4580 04/08/14 0759       Assessment/Plan Carcinoma at the tail of the pancreas POD#23 robotic assisted converted to laparoscopic hand assisted distal pancreatectomy, splenectomy with partial colectomy---Dr. Barry Dienes -diarrhea has decreased, no vomiting, nausea improved.  CT abdomen and pelvis 3/31 shows resolved abscess, no other signigicant findings.  Appetite remains poor, but tolerating a bit more liquids now.    Tramadol changed to PRN 3/31,  Protonix added as well as Carafate with some improvement in nausea.  Unable to do Reglan due to diarrhea. Will try imodium since nausea is better.  C diff was neg.    -Cont soft diet, ensure supplements, will stop TPN today  -SCD/lovenox -mobilize, needs to increase her activity, PT eval and treat LUQ subphrenic abscess -resolved on CT 3/31. Grew few pseudomonas  -cipro IV, completed 10 day course.   Hypokalemia Resolved Urinary retention -Foley replaced due to urinary retention -Cont flomax -Pt with h/o urinary retention as well.  May need eval by GU either here or as outpatient Tremors  -primidone Acute renal failure---non oliguric ATN -renal following at distance, appreciate assistance -improving Cr DVT/PE -lovenox per pharmacy, has an IVC filter Anemia -hgb stable,  monitor Dispo--continue inpatient, await resolution N/V. Renal function is better, can be followed on OP basis per nephrology.  Plan for SNF at [place], MD  [facility]   05/02/2014 9:28 AM

## 2014-05-02 NOTE — Progress Notes (Addendum)
PARENTERAL NUTRITION CONSULT NOTE - INITIAL  Pharmacy Consult for TPN Indication: not tolerating enteral feeding  Allergies  Allergen Reactions  . Sulfa Antibiotics Swelling    Patient Measurements: Height: 5' 9.5" (176.5 cm) Weight: 211 lb 13.8 oz (96.1 kg) IBW/kg (Calculated) : 67.35 Adjusted Body Weight: 74 kg Usual body weight: 100 kg  Vital Signs: Temp: 99 F (37.2 C) (04/03 0611) Temp Source: Oral (04/03 0611) BP: 148/79 mmHg (04/03 0611) Pulse Rate: 96 (04/03 0611) Intake/Output from previous day: 04/02 0701 - 04/03 0700 In: 2520 [P.O.:1200; TPN:1320] Out: 5100 [Urine:5050; Drains:50] Intake/Output from this shift:    Labs:  Recent Labs  04/30/14 0450  WBC 10.3  HGB 7.9*  HCT 24.7*  PLT 462*     Recent Labs  04/30/14 0430  04/30/14 0450 05/01/14 0500 05/01/14 1750 05/02/14 0615  NA  --   < > 139 137 137 138  K  --   < > 3.0* 3.1* 3.8 3.9  CL  --   < > 108 106 105 106  CO2  --   < > 24 25 23 25   GLUCOSE  --   < > 127* 138* 139* 150*  BUN  --   < > 14 16 18 21   CREATININE  --   < > 2.23* 1.87* 1.75* 1.70*  CALCIUM  --   < > 8.6 8.3* 8.6 8.6  MG  --   --  1.8 1.8  --  1.6  PHOS  --   --  2.8 2.9  2.8  --  3.3  PROT  --   --  6.0  --   --   --   ALBUMIN  --   --  3.0* 3.2*  --  3.2*  AST  --   --  29  --   --   --   ALT  --   --  10  --   --   --   ALKPHOS  --   --  65  --   --   --   BILITOT  --   --  0.3  --   --   --   TRIG 82  --   --   --   --   --   < > = values in this interval not displayed. Estimated Creatinine Clearance: 40 mL/min (by C-G formula based on Cr of 1.7).    Recent Labs  05/01/14 1748 05/01/14 2357 05/02/14 0601  GLUCAP 154* 139* 142*    Insulin Requirements: Sensitive SSI q6h (5 units/ last 24 hrs)  Current Nutrition:  Diet: full liquid diet Supplements: Ensure pudding TID (provides each provides 170 kcal, 4g protein) TPN: Clinimix at 100 ml/hr   IVF: none  Central access: PICC placed 04/18/14 TPN start  date: 04/29/14  ASSESSMENT                                                                                                          Patient is a 69 y.o F with pancreatic cancer s/p  pancreatectomy and splenectomy, partial colectomy on 3/10.  She has poor oral intake with resource/ensure started for on 3/29.  She now has n/v and not able to keep anything down.  To start TPN while patient has poor oral intake.  HPI: pancreatic cancer, DVT, PE, HLP, GERD, HTN  Significant events:  3/10: distal pancreatectomy and splenectomy, partial colectomy 3/18: drainage of L upper abd fluid collection/abscesses with perc drain pacement 3/31 TPN started.  CT shows resolution of abscess. 4/3 Patient is increasing oral intake and does not need further TPN per MD.  Stop TPN after current bag infuses.  Today:    Glucose (cbgs goal<150): range 133-154, controlled on SSI  Electrolytes: K improved to WNL, 3.9 (s/p 6 runs Kcl 4/2), phos 3.3, Mag 1.6 (low end of range), Na 137, Corr Ca 8.94  Renal: AKI improving, SCr 1.7 (CrCl~40), UOP 2.2 ml/kg/hr  LFTs: wnl (4/1)  TGs: 82 (4/1)  Prealbumin: pending  NUTRITIONAL GOALS                                                                                             4/1 RD recs: Kcal: 1800-2000, Protein: 120-130 g, Fluid: 1.8 L/day  Based on RD's recommendations, will adjust Clinimix E 5/15 goal rate to 100 ml/hr + 20% fat emulsion at 10 ml/hr to provide: 120 g/day protein, 2184 Kcal/day (kcal/day content will be slightly higher than RD's recommendations as clinimix bags come in standard concentrations and we are not able to adjust kcal content in them)  PLAN                                                                                                                          Magnesium 1g IV x1 today  No further TPN orders tonight, but allow current bag to finish infusing.  D/C TPN labs  Gretta Arab PharmD, BCPS Pager [phone] 05/02/2014 8:51 AM

## 2014-05-03 LAB — RENAL FUNCTION PANEL
ALBUMIN: 3.1 g/dL — AB (ref 3.5–5.2)
Anion gap: 9 (ref 5–15)
BUN: 22 mg/dL (ref 6–23)
CALCIUM: 8.6 mg/dL (ref 8.4–10.5)
CO2: 24 mmol/L (ref 19–32)
CREATININE: 1.56 mg/dL — AB (ref 0.50–1.10)
Chloride: 106 mmol/L (ref 96–112)
GFR calc Af Amer: 39 mL/min — ABNORMAL LOW (ref >90)
GFR, EST NON AFRICAN AMERICAN: 33 mL/min — AB (ref >90)
GLUCOSE: 98 mg/dL (ref 70–99)
Phosphorus: 3.7 mg/dL (ref 2.3–4.6)
Potassium: 3.7 mmol/L (ref 3.5–5.1)
Sodium: 139 mmol/L (ref 135–145)

## 2014-05-03 LAB — CBC
HEMATOCRIT: 27.2 % — AB (ref 36.0–46.0)
Hemoglobin: 8.5 g/dL — ABNORMAL LOW (ref 12.0–15.0)
MCH: 29.3 pg (ref 26.0–34.0)
MCHC: 31.3 g/dL (ref 30.0–36.0)
MCV: 93.8 fL (ref 78.0–100.0)
Platelets: 460 10*3/uL — ABNORMAL HIGH (ref 150–400)
RBC: 2.9 MIL/uL — AB (ref 3.87–5.11)
RDW: 16.1 % — ABNORMAL HIGH (ref 11.5–15.5)
WBC: 9 10*3/uL (ref 4.0–10.5)

## 2014-05-03 LAB — MAGNESIUM: Magnesium: 1.8 mg/dL (ref 1.5–2.5)

## 2014-05-03 LAB — HEPARIN ANTI-XA: HEPARIN LMW: 1.71 [IU]/mL

## 2014-05-03 MED ORDER — ENOXAPARIN SODIUM 80 MG/0.8ML ~~LOC~~ SOLN
80.0000 mg | Freq: Two times a day (BID) | SUBCUTANEOUS | Status: DC
  Administered 2014-05-03 – 2014-05-04 (×2): 80 mg via SUBCUTANEOUS
  Filled 2014-05-03 (×4): qty 0.8

## 2014-05-03 NOTE — Care Management Note (Signed)
    Page 1 of 2   05/03/2014     10:21:36 AM CARE MANAGEMENT NOTE 05/03/2014  Patient:  [name], [name]   Account Number:  [id number]  Date Initiated:  04/09/2014  Documentation initiated by:  DAVIS,RHONDA  Subjective/Objective Assessment:   gastric surgery hypotensive post op  Now with AKI, elevated creat     Action/Plan:   SNF for rehab   Anticipated DC Date:  05/05/2014   Anticipated DC Plan:  SKILLED NURSING FACILITY  In-house referral  Clinical Social Worker      DC Planning Services  CM consult      [facility] Choice  NA   Choice offered to / List presented to:  NA           Status of service:  Completed, signed off Medicare Important Message given?  YES (If response is "NO", the following Medicare IM given date fields will be blank) Date Medicare IM given:  05/03/2014 Medicare IM given by:  [name] Date Additional Medicare IM given:   Additional Medicare IM given by:    Discharge Disposition:  [place]  Per UR Regulation:  Reviewed for med. necessity/level of care/duration of stay  If discussed at [place] of Stay Meetings, dates discussed:    Comments:  05-03-14 [place] on TNA for short time, now weaned off and advancing PO, some nausea and poor po. Continue to advance diet, AKI improved, plan for OP f/u. Plan for SNF 1-2 days.  04-22-14 Sunday Spillers RN CM Now with AKI, nephrology consulted, avoiding nephrotoxic agents and increasing IVF  04-15-14 Sunday Spillers RN CM Slow to progress, CT today to r/o intraabd abscess, temp and WBC elevated.  April 12, 2014/Rhonda L. Rosana Hoes, RN, BSN, CCM. Case Management [place] [phone] No discharge needs present of time of review. POD 3 hypotensive and recieiving albumin units x2.  April 09, 2014/Rhonda L. Rosana Hoes, RN, BSN, CCM. Case Management [place] [phone] No discharge needs present of time of review.

## 2014-05-03 NOTE — Progress Notes (Addendum)
Patient ID: [name], female   DOB: 11-Aug-1945, 69 y.o.   MRN: [id number]     [address] [place], [place] [address]    Phone: [phone] FAX: [phone]     Subjective: No further nausea.  Still unclear how much she is eating. No PT this weekend.  No fevers.      Objective:  Vital signs:  Filed Vitals:   05/02/14 0611 05/02/14 1400 05/02/14 2100 05/03/14 0510  BP: 148/79 135/66 136/73 130/72  Pulse: 96 97 108 105  Temp: 99 F (37.2 C) 98.7 F (37.1 C) 98.3 F (36.8 C) 98.6 F (37 C)  TempSrc: Oral Oral Oral Oral  Resp: 18 18 18 18   Height:      Weight:      SpO2: 100% 100% 98% 97%    Last BM Date: 05/02/14  Intake/Output   Yesterday:  04/03 0701 - 04/04 0700 In: 10  Out: 3440 [Urine:3250; Drains:190] This shift: I/O last 3 completed shifts: In: 0141 [P.O.:480; Other:10] Out: 5870 [Urine:5650; Drains:220]      Physical Exam: General: Pt awake/alert/oriented x4 in no acute distress Chest: clear. No chest wall pain w good excursion CV: Pulses intact. Regular rhythm Abdomen: Soft. Nondistended. Mildly tender at incisions only. Drain #1 scant brown output.  Drain #2 still sl greenish. Incisions are c/d/i. No evidence of peritonitis. No incarcerated hernias. Ext: SCDs BLE. BLE edema. 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 Active Problems:   Carcinoma of tail of pancreas   Abscess of abdominal cavity   Malnutrition of moderate degree   Intra-abdominal abscess    Results:   Labs: Results for orders placed or performed during the hospital encounter of 04/08/14 (from the past 48 hour(s))  Glucose, capillary     Status: Abnormal   Collection Time: 05/01/14 12:06 PM  Result Value Ref Range   Glucose-Capillary 133 (H) 70 - 99 mg/dL  Glucose, capillary     Status:  Abnormal   Collection Time: 05/01/14  5:48 PM  Result Value Ref Range   Glucose-Capillary 154 (H) 70 - 99 mg/dL  Basic metabolic panel     Status: Abnormal   Collection Time: 05/01/14  5:50 PM  Result Value Ref Range   Sodium 137 135 - 145 mmol/L   Potassium 3.8 3.5 - 5.1 mmol/L    Comment: RESULT REPEATED AND VERIFIED DELTA CHECK NOTED    Chloride 105 96 - 112 mmol/L   CO2 23 19 - 32 mmol/L   Glucose, Bld 139 (H) 70 - 99 mg/dL   BUN 18 6 - 23 mg/dL   Creatinine, Ser 1.75 (H) 0.50 - 1.10 mg/dL   Calcium 8.6 8.4 - 10.5 mg/dL   GFR calc non Af Amer 29 (L) >90 mL/min   GFR calc Af Amer 34 (L) >90 mL/min    Comment: (NOTE) The eGFR has been calculated using the CKD EPI equation. This calculation has not been validated in all clinical situations. eGFR's persistently <90 mL/min signify possible Chronic Kidney Disease.    Anion gap 9 5 - 15  Glucose, capillary     Status: Abnormal   Collection Time: 05/01/14 11:57 PM  Result Value Ref Range   Glucose-Capillary 139 (H) 70 - 99 mg/dL  Glucose, capillary     Status: Abnormal   Collection Time:  05/02/14  6:01 AM  Result Value Ref Range   Glucose-Capillary 142 (H) 70 - 99 mg/dL  Renal function panel     Status: Abnormal   Collection Time: 05/02/14  6:15 AM  Result Value Ref Range   Sodium 138 135 - 145 mmol/L   Potassium 3.9 3.5 - 5.1 mmol/L   Chloride 106 96 - 112 mmol/L   CO2 25 19 - 32 mmol/L   Glucose, Bld 150 (H) 70 - 99 mg/dL   BUN 21 6 - 23 mg/dL   Creatinine, Ser 1.70 (H) 0.50 - 1.10 mg/dL   Calcium 8.6 8.4 - 10.5 mg/dL   Phosphorus 3.3 2.3 - 4.6 mg/dL   Albumin 3.2 (L) 3.5 - 5.2 g/dL   GFR calc non Af Amer 30 (L) >90 mL/min   GFR calc Af Amer 35 (L) >90 mL/min    Comment: (NOTE) The eGFR has been calculated using the CKD EPI equation. This calculation has not been validated in all clinical situations. eGFR's persistently <90 mL/min signify possible Chronic Kidney Disease.    Anion gap 7 5 - 15  Magnesium      Status: None   Collection Time: 05/02/14  6:15 AM  Result Value Ref Range   Magnesium 1.6 1.5 - 2.5 mg/dL  Glucose, capillary     Status: Abnormal   Collection Time: 05/02/14 11:51 AM  Result Value Ref Range   Glucose-Capillary 152 (H) 70 - 99 mg/dL  Glucose, capillary     Status: Abnormal   Collection Time: 05/02/14  6:09 PM  Result Value Ref Range   Glucose-Capillary 103 (H) 70 - 99 mg/dL  Renal function panel     Status: Abnormal   Collection Time: 05/03/14  4:40 AM  Result Value Ref Range   Sodium 139 135 - 145 mmol/L   Potassium 3.7 3.5 - 5.1 mmol/L   Chloride 106 96 - 112 mmol/L   CO2 24 19 - 32 mmol/L   Glucose, Bld 98 70 - 99 mg/dL   BUN 22 6 - 23 mg/dL   Creatinine, Ser 1.56 (H) 0.50 - 1.10 mg/dL   Calcium 8.6 8.4 - 10.5 mg/dL   Phosphorus 3.7 2.3 - 4.6 mg/dL   Albumin 3.1 (L) 3.5 - 5.2 g/dL   GFR calc non Af Amer 33 (L) >90 mL/min   GFR calc Af Amer 39 (L) >90 mL/min    Comment: (NOTE) The eGFR has been calculated using the CKD EPI equation. This calculation has not been validated in all clinical situations. eGFR's persistently <90 mL/min signify possible Chronic Kidney Disease.    Anion gap 9 5 - 15  Magnesium     Status: None   Collection Time: 05/03/14  4:40 AM  Result Value Ref Range   Magnesium 1.8 1.5 - 2.5 mg/dL  CBC     Status: Abnormal   Collection Time: 05/03/14  4:40 AM  Result Value Ref Range   WBC 9.0 4.0 - 10.5 K/uL   RBC 2.90 (L) 3.87 - 5.11 MIL/uL   Hemoglobin 8.5 (L) 12.0 - 15.0 g/dL   HCT 27.2 (L) 36.0 - 46.0 %   MCV 93.8 78.0 - 100.0 fL   MCH 29.3 26.0 - 34.0 pg   MCHC 31.3 30.0 - 36.0 g/dL   RDW 16.1 (H) 11.5 - 15.5 %   Platelets 460 (H) 150 - 400 K/uL    Imaging / Studies: No results found.  Medications / Allergies:  Scheduled Meds: . acetaminophen  650 mg  Oral Q6H  . atorvastatin  40 mg Oral q1800  . brimonidine  1 drop Left Eye BID   And  . timolol  1 drop Left Eye BID  . darbepoetin (ARANESP) injection - NON-DIALYSIS  100  mcg Subcutaneous Q Tue-1800  . enoxaparin (LOVENOX) injection  100 mg Subcutaneous Q12H  . feeding supplement (ENSURE)  1 Container Oral TID BM  . latanoprost  1 drop Both Eyes QHS  . loperamide  2 mg Oral TID AC  . pantoprazole  40 mg Oral Daily  . primidone  50 mg Oral QHS  . sodium chloride  10-40 mL Intracatheter Q12H  . sucralfate  1 g Oral TID WC & HS  . tamsulosin  0.4 mg Oral Daily   Continuous Infusions:   PRN Meds:.albuterol, alum & mag hydroxide-simeth, lip balm, ondansetron **OR** ondansetron (ZOFRAN) IV, sodium chloride, traMADol  Antibiotics: Anti-infectives    Start     Dose/Rate Route Frequency Ordered Stop   04/30/14 2146  ciprofloxacin (CIPRO) IVPB 400 mg     400 mg 200 mL/hr over 60 Minutes Intravenous Every 12 hours 04/30/14 1106 05/01/14 2204   04/27/14 1000  ciprofloxacin (CIPRO) IVPB 400 mg  Status:  Discontinued     400 mg 200 mL/hr over 60 Minutes Intravenous Every 24 hours 04/26/14 1038 04/30/14 1106   04/21/14 1000  ciprofloxacin (CIPRO) tablet 500 mg  Status:  Discontinued     500 mg Oral Every 24 hours 04/21/14 0849 04/26/14 1038   04/20/14 1400  piperacillin-tazobactam (ZOSYN) IVPB 2.25 g  Status:  Discontinued     2.25 g 100 mL/hr over 30 Minutes Intravenous Every 6 hours 04/20/14 0713 04/21/14 0849   04/15/14 1800  vancomycin (VANCOCIN) 1,250 mg in sodium chloride 0.9 % 250 mL IVPB  Status:  Discontinued     1,250 mg 166.7 mL/hr over 90 Minutes Intravenous Every 12 hours 04/15/14 0801 04/17/14 0643   04/15/14 1500  piperacillin-tazobactam (ZOSYN) IVPB 3.375 g  Status:  Discontinued     3.375 g 12.5 mL/hr over 240 Minutes Intravenous 3 times per day 04/15/14 0801 04/20/14 1316   04/15/14 0900  vancomycin (VANCOCIN) 1,500 mg in sodium chloride 0.9 % 500 mL IVPB     1,500 mg 250 mL/hr over 120 Minutes Intravenous  Once 04/15/14 0801 04/15/14 1231   04/15/14 0830  piperacillin-tazobactam (ZOSYN) IVPB 3.375 g     3.375 g 100 mL/hr over 30 Minutes  Intravenous  Once 04/15/14 0801 04/15/14 0928   04/08/14 1600  cefoTEtan (CEFOTAN) 2 g in dextrose 5 % 50 mL IVPB     2 g 100 mL/hr over 30 Minutes Intravenous Every 12 hours 04/08/14 1522 04/09/14 0530   04/08/14 0537  ceFAZolin (ANCEF) IVPB 2 g/50 mL premix     2 g 100 mL/hr over 30 Minutes Intravenous On call to O.R. 04/08/14 5465 04/08/14 0759       Assessment/Plan Carcinoma at the tail of the pancreas POD#23 robotic assisted converted to laparoscopic hand assisted distal pancreatectomy, splenectomy with partial colectomy---Dr. Barry Dienes -diarrhea has decreased, no vomiting, nausea improved.  CT abdomen and pelvis 3/31 shows resolved abscess, no other signigicant findings.  Appetite remains poor, but tolerating a bit more liquids now.    Tramadol changed to PRN 3/31, Protonix added as well as Carafate with some improvement in nausea.  Imodium since nausea is better.  C diff was neg.    -Try regular diet in order to give her more food options.  Calorie  counts.   -SCD/lovenox -mobilize, needs to increase her activity, PT eval and treat  LUQ subphrenic abscess -resolved on CT 3/31. Grew few pseudomonas  -cipro IV, completed 10 day course.   Hypokalemia Resolved Urinary retention -Foley replaced due to urinary retention -Cont flomax -Pt with h/o urinary retention as well.  Urology consult. Tremors  -primidone Acute renal failure---non oliguric ATN -renal following at distance, appreciate assistance -improving Cr DVT/PE -lovenox per pharmacy, has an IVC filter Anemia -hgb stable, monitor Dispo--continue inpatient, await resolution N/V. Renal function is better, can be followed on OP basis per nephrology.  Plan for SNF at DC.  Should be ready as early as tomorrow/wednesday.       [facility]   05/03/2014 8:32 AM

## 2014-05-03 NOTE — Progress Notes (Signed)
Calorie Count Note  48-hour calorie count ordered.  Diet: Full Liquid Supplements: D/C Ensure Enlive po TID, each supplement provides 350 kcal and 20 grams of protein. Pt reports that supplements cause diarrhea. Refused further supplementation at this time.   TPN stopped 4/3. PO intake recorded as 50%. Pt with decreased nausea and decreased diarrhea. Diet upgraded to regular diet. Pt feels that she will be able to eat.  Instructions provided in Calorie Count order: "Please hang calorie count envelope on the patient's door. Document percent consumed for each item on the patient's meal tray ticket and keep in envelope. Also document percent of any supplement or snack pt consumes and keep documentation in envelope for RD to review"  - RD to review po intake tomorrow 4/5.  Nutrition Dx: Inadequate oral intake related to poor appetite as evidenced by poor PO intake of 5%  Goal: Pt to meet >/= 90% of their estimated nutrition needs   [place], [place], [place]

## 2014-05-03 NOTE — Progress Notes (Signed)
[facility] for Lovenox Indication: h/o recurrent DVT   Allergies  Allergen Reactions  . Sulfa Antibiotics Swelling    Patient Measurements: Height: 5' 9.5" (176.5 cm) Weight: 211 lb 13.8 oz (96.1 kg) IBW/kg (Calculated) : 67.35   Vital Signs: Temp: 98.6 F (37 C) (04/04 0510) Temp Source: Oral (04/04 0510) BP: 130/72 mmHg (04/04 0510) Pulse Rate: 105 (04/04 0510)  Labs:  Recent Labs  05/01/14 1750 05/02/14 0615 05/03/14 0440  HGB  --   --  8.5*  HCT  --   --  27.2*  PLT  --   --  460*  CREATININE 1.75* 1.70* 1.56*    Estimated Creatinine Clearance: 43.6 mL/min (by C-G formula based on Cr of 1.56).   Medications:  Scheduled:  . acetaminophen  650 mg Oral Q6H  . atorvastatin  40 mg Oral q1800  . brimonidine  1 drop Left Eye BID   And  . timolol  1 drop Left Eye BID  . darbepoetin (ARANESP) injection - NON-DIALYSIS  100 mcg Subcutaneous Q Tue-1800  . enoxaparin (LOVENOX) injection  100 mg Subcutaneous Q12H  . feeding supplement (ENSURE)  1 Container Oral TID BM  . latanoprost  1 drop Both Eyes QHS  . loperamide  2 mg Oral TID AC  . pantoprazole  40 mg Oral Daily  . primidone  50 mg Oral QHS  . sodium chloride  10-40 mL Intracatheter Q12H  . sucralfate  1 g Oral TID WC & HS  . tamsulosin  0.4 mg Oral Daily   Infusions:     Assessment: 69 y/o F with pancreatic adenocarcinoma, s/p distal pancreatectomy and splenectomy, partial colectomy 04/08/14.  PMH includes recurrent DVT on full-dose Lovenox (100mg  SQ q12h) prior to admission.  Lovenox was held for surgery, then resumed 3/19.  Also on 3/19 patient developed acute kidney injury with SCr 1.84 and estimated CrCl < 30 mL/min, so attending MD reduced Lovenox to 100mg  SQ q24h.  In the interim, the SCr continued rising and peaked at 3.94 on 3/23 with estimated CrCl < 20 mL/min.  Given the potential for Lovenox accumulation in this setting, an order was obtained for pharmacy  to assist with dosing.  Anti-Xa levels  3/25 at 1100 (six hours post-dose) = 1.15, which was within the laboratory's therapeutic range of 0.5 - 1.2 but slightly above the optimal range of 0.6 -1.0.  Suboptimal draw time (six hours rather than four hours post-dose) was noted.  Dosage was initially left unchanged, but because of persistent elevation in SCr with potential for further drug accumulation, on 3/27 dosage was reduced to 90 mg SQ q24h x 1 day, then 80 mg SQ q24h.  3/29 (4 hours post-dos) = 0.96, therapeutic.  Continue on Lovenox 80mg  SQ q24h  4/04 (5 hrs post-dose)= 1.71 (goal 0.6-1)  Today, 05/03/2014:  Currently on Lovenox 100 mg SQ q12h  Renal function: improving with SCr trending down to 1.56, CrCl ~ 44 ml/min, good UOP  CBC: Hgb 8.5 (low, stable), and Plt elevated  No bleeding reported in chart note.  Goal of Therapy:  Full anticoagulation, Lovenox adjusted for renal function Anti-Xa level 0.6-1.0 unit/mL   Plan:   Reduce dose to 80mg  SQ q12h  Will plan on rechecking another 4 hr anti-xa level when scr stabilizes and adjust dose if needed  Follow renal function, CBC  Dia Sitter, PharmD, BCPS

## 2014-05-03 NOTE — Consult Note (Signed)
Urology Consult   Physician requesting consult: Barry Dienes  Reason for consult: Post op urinary retention  History of Present Illness: [name] is a 69 y.o. female with PMH significant for DVT, peripheral neuropathy, diverticulosis, PE, and HTN who is POD #23 for a robotic assisted converted to laparoscopic hand assisted distal pancreatectomy, splenectomy, with partial colectomy by Dr. Barry Dienes for pancreatic cancer.  She has had urinary retention through out the post op course.  Void trials have been attempted with and without I/O cathing.  Flomax was added to her med regime on 04/19/14 as well.   She states she has not felt the urge to void at all since her surgery despite bladder scans being as high as 500cc.  She has had return of bowel function.  She is being followed by nephrology for post op ATN and her Cr is trending down.   Pt states that in her from her early 23s through her late 27s she had episodes of urinary retention at least once a month.  She would would be distended with the urge to void but unable to.  She would then present to the [facility] hospital and be I/O cathed.  She would then be instructed to go home and sit in a warm bath until she was able to void again.  She would then eventually void again on her own until the next episode.  She had no other interventions or studies.  She does not recall having UTIs associated with these situations. She has never been evaled by a urologist.  At the end of her late 49s she states the episodes stopped completely and she feels as though she has been voiding normally with the exception of frequency since that time.  She then had back surgery approx 2 years ago with post op retention.  The foley only had to be replaced once and remained in place for 2-3 days after which she passed a void trial.  Since then she continued to have the same frequency issue but denies all other LUTs.  She does have urge incontinence sometimes at night but not during the  day and not every night.  She is denies hx of stones and UTIs.  She does recall any hematuria.    She is currently resting comfortably without complaint.  Foley is in place with good urine output. She denies F/C, HA, SOB, N/V, and abdominal pain.     Past Medical History  Diagnosis Date  . Glaucoma   . Clotting disorder   . DVT (deep venous thrombosis)     LEFT  . GERD (gastroesophageal reflux disease)   . Hypercholesterolemia   . Peripheral neuropathy   . Essential tremor   . Diverticulosis   . Arthritis     knees and back  . Cancer     Pancreatic  . Hypertension   . Colon polyps   . UTI (lower urinary tract infection)   . Complication of anesthesia     slow to wak up   . Pulmonary embolism     Past Surgical History  Procedure Laterality Date  . Cholecystectomy    . Abdominal hysterectomy    . Knee surgery Bilateral   . Greenfield filter    . Back surgery    . Eus N/A 02/26/2014    Procedure: UPPER ENDOSCOPIC ULTRASOUND (EUS) LINEAR;  Surgeon: Beryle Beams, MD;  Location: [facility] END[facility];  Service: Endoscopy;  Laterality: N/A;  . Fine needle aspiration N/A 02/26/2014  Procedure: FINE NEEDLE ASPIRATION (FNA) LINEAR;  Surgeon: Beryle Beams, MD;  Location: [facility] ENDOSCOPY;  Service: Endoscopy;  Laterality: N/A;  . Breast lumpectomy Right   . Spine surgery    . Xi robotic assisted laparoscopic distal pancreatectomy N/A 04/08/2014    Procedure: XI ROBOTIC ASSISTED CONVERTED LAPAROSCOPIC HAND ASSITED DISTAL PANCREATECTOMY/SPLENECTOMY WITH PARTIAL COLECTOMY;  Surgeon: [name], MD;  Location: [facility] ORS;  Service: General;  Laterality: N/A;     Current Hospital Medications:  Home meds:    Medication List    ASK your doctor about these medications        acetaminophen 500 MG tablet  Commonly known as:  TYLENOL  Take 500 mg by mouth every 6 (six) hours as needed (Pain).     alum & mag hydroxide-simeth 824-235-36 MG/5ML suspension  Commonly known as:  MAALOX PLUS  Take  30 mLs by mouth every 6 (six) hours as needed for indigestion.     atorvastatin 40 MG tablet  Commonly known as:  LIPITOR  TAKE 1 TABLET BY MOUTH DAILY     brimonidine-timolol 0.2-0.5 % ophthalmic solution  Commonly known as:  COMBIGAN  Place 1 drop into the left eye every 12 (twelve) hours.     CARTIA XT 120 MG 24 hr capsule  Generic drug:  diltiazem  TAKE ONE CAPSULE BY MOUTH DAILY     doxycycline 100 MG tablet  Commonly known as:  VIBRA-TABS  Take 1 tablet (100 mg total) by mouth 2 (two) times daily.     enoxaparin 100 MG/ML injection  Commonly known as:  LOVENOX  Inject 1 mL (100 mg total) into the skin every 12 (twelve) hours.     gabapentin 300 MG capsule  Commonly known as:  NEURONTIN  TAKE ONE CAPSULE BY MOUTH AT BEDTIME; AFTER 7 DAYS, MAY INCREASE TO 2 CAPSULES AT BEDTIME     HYDROcodone-acetaminophen 5-325 MG per tablet  Commonly known as:  NORCO/VICODIN  Take 1 tablet by mouth every 6 (six) hours as needed for moderate pain.     latanoprost 0.005 % ophthalmic solution  Commonly known as:  XALATAN  Place 1 drop into both eyes at bedtime.     pantoprazole 40 MG tablet  Commonly known as:  PROTONIX  TAKE 1 TABLET BY MOUTH DAILY     primidone 50 MG tablet  Commonly known as:  MYSOLINE  Take 50 mg by mouth daily. Patient takes at nite     propranolol ER 120 MG 24 hr capsule  Commonly known as:  INDERAL LA  TAKE ONE CAPSULE BY MOUTH DAILY     traMADol 50 MG tablet  Commonly known as:  ULTRAM  Take 1 tablet (50 mg total) by mouth every 8 (eight) hours as needed.        Scheduled Meds: . acetaminophen  650 mg Oral Q6H  . atorvastatin  40 mg Oral q1800  . brimonidine  1 drop Left Eye BID   And  . timolol  1 drop Left Eye BID  . darbepoetin (ARANESP) injection - NON-DIALYSIS  100 mcg Subcutaneous Q Tue-1800  . enoxaparin (LOVENOX) injection  80 mg Subcutaneous Q12H  . latanoprost  1 drop Both Eyes QHS  . loperamide  2 mg Oral TID AC  . pantoprazole  40  mg Oral Daily  . primidone  50 mg Oral QHS  . sodium chloride  10-40 mL Intracatheter Q12H  . sucralfate  1 g Oral TID WC & HS  . tamsulosin  0.4 mg Oral Daily   Continuous Infusions:  PRN Meds:.albuterol, alum & mag hydroxide-simeth, lip balm, ondansetron **OR** ondansetron (ZOFRAN) IV, sodium chloride, traMADol  Allergies:  Allergies  Allergen Reactions  . Sulfa Antibiotics Swelling    Family History  Problem Relation Age of Onset  . Diabetes Father   . Cancer Father 85    prostate  . Hyperlipidemia Father   . Cataracts Maternal Grandmother   . Hypertension Mother   . Arthritis Mother   . Heart disease Mother   . Glaucoma Sister   . Cancer Sister 21    Breast  . Cancer Paternal Uncle     throat +cig  . Diabetes Paternal Grandmother     Social History:  reports that she has been smoking Cigarettes.  She has a 22.5 pack-year smoking history. She has never used smokeless tobacco. She reports that she drinks alcohol. She reports that she does not use illicit drugs.  ROS: A complete review of systems was performed.  All systems are negative except for pertinent findings as noted.  Physical Exam:  Vital signs in last 24 hours: Temp:  [98.3 F (36.8 C)-98.6 F (37 C)] 98.4 F (36.9 C) (04/04 1422) Pulse Rate:  [105-129] 109 (04/04 1526) Resp:  [18] 18 (04/04 1422) BP: (125-136)/(58-73) 125/58 mmHg (04/04 1422) SpO2:  [97 %-100 %] 100 % (04/04 1422) Constitutional:  Alert and oriented, No acute distress Cardiovascular: Regular rate and rhythm Respiratory: Normal respiratory effort GI: Abdomen is soft, nontender, nondistended, no abdominal masses GU: No CVA tenderness'; foley in place with clear/yellow urine Lymphatic: No lymphadenopathy Neurologic: Grossly intact, no focal deficits Psychiatric: Normal mood and affect  Laboratory Data:   Recent Labs  05/03/14 0440  WBC 9.0  HGB 8.5*  HCT 27.2*  PLT 460*     Recent Labs  05/01/14 0500 05/01/14 1750  05/02/14 0615 05/03/14 0440  NA 137 137 138 139  K 3.1* 3.8 3.9 3.7  CL 106 105 106 106  GLUCOSE 138* 139* 150* 98  BUN 16 18 21 22   CALCIUM 8.3* 8.6 8.6 8.6  CREATININE 1.87* 1.75* 1.70* 1.56*     Results for orders placed or performed during the hospital encounter of 04/08/14 (from the past 24 hour(s))  Glucose, capillary     Status: Abnormal   Collection Time: 05/02/14  6:09 PM  Result Value Ref Range   Glucose-Capillary 103 (H) 70 - 99 mg/dL  Renal function panel     Status: Abnormal   Collection Time: 05/03/14  4:40 AM  Result Value Ref Range   Sodium 139 135 - 145 mmol/L   Potassium 3.7 3.5 - 5.1 mmol/L   Chloride 106 96 - 112 mmol/L   CO2 24 19 - 32 mmol/L   Glucose, Bld 98 70 - 99 mg/dL   BUN 22 6 - 23 mg/dL   Creatinine, Ser 1.56 (H) 0.50 - 1.10 mg/dL   Calcium 8.6 8.4 - 10.5 mg/dL   Phosphorus 3.7 2.3 - 4.6 mg/dL   Albumin 3.1 (L) 3.5 - 5.2 g/dL   GFR calc non Af Amer 33 (L) >90 mL/min   GFR calc Af Amer 39 (L) >90 mL/min   Anion gap 9 5 - 15  Magnesium     Status: None   Collection Time: 05/03/14  4:40 AM  Result Value Ref Range   Magnesium 1.8 1.5 - 2.5 mg/dL  CBC     Status: Abnormal   Collection Time: 05/03/14  4:40 AM  Result  Value Ref Range   WBC 9.0 4.0 - 10.5 K/uL   RBC 2.90 (L) 3.87 - 5.11 MIL/uL   Hemoglobin 8.5 (L) 12.0 - 15.0 g/dL   HCT 27.2 (L) 36.0 - 46.0 %   MCV 93.8 78.0 - 100.0 fL   MCH 29.3 26.0 - 34.0 pg   MCHC 31.3 30.0 - 36.0 g/dL   RDW 16.1 (H) 11.5 - 15.5 %   Platelets 460 (H) 150 - 400 K/uL  Low molecular wgt heparin (fractionated)     Status: None   Collection Time: 05/03/14  9:45 AM  Result Value Ref Range   Heparin LMW 1.71 IU/mL   Recent Results (from the past 240 hour(s))  Clostridium Difficile by PCR     Status: None   Collection Time: 04/25/14  1:53 AM  Result Value Ref Range Status   C difficile by pcr NEGATIVE NEGATIVE Final    Renal Function:  Recent Labs  04/28/14 0413 04/29/14 0420 04/30/14 0450  05/01/14 0500 05/01/14 1750 05/02/14 0615 05/03/14 0440  CREATININE 2.97* 2.67* 2.23* 1.87* 1.75* 1.70* 1.56*   Estimated Creatinine Clearance: 43.6 mL/min (by C-G formula based on Cr of 1.56).  Radiologic Imaging: CLINICAL DATA: Intermittent nausea and vomiting with poor appetite and left upper quadrant soreness. History of distal pancreatectomy/ splenectomy and partial colectomy. Subsequent encounter.  EXAM: CT ABDOMEN AND PELVIS WITHOUT CONTRAST  TECHNIQUE: Multidetector CT imaging of the abdomen and pelvis was performed following the standard protocol without IV contrast.  COMPARISON: 04/24/2014. MR abdomen 01/05/2014.  FINDINGS: Lower chest: Lung bases show a moderate simple appearing left pleural effusion with likely compressive atelectasis in the left lower lobe. Heart size within normal limits. Decreased attenuation of the intravascular compartment is indicative of anemia. No pericardial effusion.  Hepatobiliary: A lobulated low-attenuation lesion in the inferior right hepatic lobe measures 2.5 cm, stable, characterized as a cyst on 01/05/2014. Liver is otherwise unremarkable. Cholecystectomy. Extrahepatic bile duct measures 1.4 cm, stable.  Pancreas: Status post distal pancreatectomy with a focal low-attenuation lesion at the surgical margin, measuring 2.7 x 3.0 cm, grossly stable in size. Pancreas is otherwise unremarkable.  Spleen: Surgically absent.  Adrenals/Urinary Tract: Adrenal glands and right kidney are unremarkable. Low-attenuation lesion in the interpolar left kidney measures 1.5 cm, stable and characterized as a cyst on 01/05/2014. Ureters are decompressed. Air in the bladder is presumably iatrogenic. Bladder wall may be slightly thickened.  Stomach/Bowel: Stomach, small bowel and proximal colon are unremarkable. An anastomosis is seen at the splenic flexure. Colon is otherwise unremarkable.  Vascular/Lymphatic: Atherosclerotic  calcification of the arterial vasculature without abdominal aortic aneurysm. No pathologically enlarged lymph nodes. IVC filter is in place.  Reproductive: Hysterectomy. Ovaries are visualized.  Other: Small pelvic free fluid. 2 percutaneous drains terminate in the left upper quadrant, along the posterior margin of the stomach and anterior margin of the left kidney. No definite associated fluid collection. Upper abdominal midline ventral hernia contains fat, small in size. Presumed subcutaneous injection densities and air in the ventral low abdomen and pelvis. Mesenteries and peritoneum are otherwise unremarkable.  Musculoskeletal: No worrisome lytic or sclerotic lesions.  IMPRESSION: 1. Postoperative changes of distal pancreatectomy with stable pseudocyst or seroma adjacent to the surgical margin. 2. Resolved left upper quadrant abscess with 2 percutaneous drains in place. 3. Small pelvic free fluid. 4. Moderate left pleural effusion with likely compressive atelectasis in the left lower lobe. Difficult to definitively exclude pneumonia.  Impression/Recommendation: Post op urinary retention--pt has interesting hx of retention  with frequent cathing in her 80s.  Due to this she possibly has a urethral stricture or bladder neck contracture which could contribute to incomplete emptying and retention.  She also has not had the urge to void despite larger urine volumes documented on PVRs which could indicate poor bladder function and incomplete emptying.  Leave foley in place for at least 5-7 days and continue Flomax.  She will need a void trial, cysto, and possible urodynamics as an outpt to assess anatomy and bladder function.   Amado Nash, AMANDA 05/03/2014, 3:42 PM    Attending attestation: Patient was personally seen and examined this evening. She has a long history of urinary retention and required some time for her bladder return to baseline after some back surgery. She may be a  person who carries a chronic high residual but there are too many variables right now I think to keep the Foley out. Discussed with the patient and her family the nature risk benefits and alternatives to Foley catheter such as removing it then continued surveillance or CIC. They elected to continue Foley catheter. We went over the rationale for alpha blockers. Patient had acute renal failure which is resolving. CT scan showed no hydronephrosis. I reviewed the images. Creatinine is recovering. She is progressing with physical therapy and I think when she is back to her normal strength hopefully her voiding were returned to baseline. If not she may need Foley for the next few months and then urodynamics. Recommended continue tamsulosin Foley catheter. She can get a skilled nursing. Recommend a change catheter every 3-4 weeks and follow-up in the office in the next few weeks.

## 2014-05-03 NOTE — Progress Notes (Signed)
Patient ID: [name], female   DOB: 03-Aug-1945, 69 y.o.   MRN: [id number]    Referring Physician(s): [facility]  Subjective:  Patient without new complaints  Allergies: Sulfa antibiotics  Medications: Prior to Admission medications   Medication Sig Start Date End Date Taking? Authorizing Provider  alum & mag hydroxide-simeth (MAALOX PLUS) 400-400-40 MG/5ML suspension Take 30 mLs by mouth every 6 (six) hours as needed for indigestion.   Yes Historical Provider, MD  atorvastatin (LIPITOR) 40 MG tablet TAKE 1 TABLET BY MOUTH DAILY 03/28/14  Yes Mancel Bale, PA-C  brimonidine-timolol (COMBIGAN) 0.2-0.5 % ophthalmic solution Place 1 drop into the left eye every 12 (twelve) hours.   Yes Historical Provider, MD  CARTIA XT 120 MG 24 hr capsule TAKE ONE CAPSULE BY MOUTH DAILY Patient taking differently: TAKE ONE CAPSULE BY MOUTH DAILY IN THE EVENING 03/28/14  Yes Mancel Bale, PA-C  enoxaparin (LOVENOX) 100 MG/ML injection Inject 1 mL (100 mg total) into the skin every 12 (twelve) hours. 02/16/14  Yes Truitt Merle, MD  gabapentin (NEURONTIN) 300 MG capsule TAKE ONE CAPSULE BY MOUTH AT BEDTIME; AFTER 7 DAYS, MAY INCREASE TO 2 CAPSULES AT BEDTIME 03/28/14  Yes Mancel Bale, PA-C  HYDROcodone-acetaminophen (NORCO/VICODIN) 5-325 MG per tablet Take 1 tablet by mouth every 6 (six) hours as needed for moderate pain. Patient taking differently: Take 2 tablets by mouth every 4 (four) hours as needed for moderate pain.  12/31/13  Yes Wardell Honour, MD  latanoprost (XALATAN) 0.005 % ophthalmic solution Place 1 drop into both eyes at bedtime.   Yes Historical Provider, MD  pantoprazole (PROTONIX) 40 MG tablet TAKE 1 TABLET BY MOUTH DAILY 03/28/14  Yes Leandrew Koyanagi, MD  primidone (MYSOLINE) 50 MG tablet Take 50 mg by mouth daily. Patient takes at nite   Yes Historical Provider, MD  propranolol ER (INDERAL LA) 120 MG 24 hr capsule TAKE ONE CAPSULE BY MOUTH DAILY Patient taking differently: TAKE ONE CAPSULE BY  MOUTH DAILY AT BEDTIME 03/28/14  Yes Mancel Bale, PA-C  acetaminophen (TYLENOL) 500 MG tablet Take 500 mg by mouth every 6 (six) hours as needed (Pain).    Historical Provider, MD  doxycycline (VIBRA-TABS) 100 MG tablet Take 1 tablet (100 mg total) by mouth 2 (two) times daily. 03/19/14   Golden Circle, FNP  traMADol (ULTRAM) 50 MG tablet Take 1 tablet (50 mg total) by mouth every 8 (eight) hours as needed. Patient not taking: Reported on 03/29/2014 03/19/14   Golden Circle, FNP     Vital Signs: BP 125/58 mmHg  Pulse 129  Temp(Src) 98.4 F (36.9 C) (Oral)  Resp 18  Ht 5' 9.5" (1.765 m)  Wt 211 lb 13.8 oz (96.1 kg)  BMI 30.85 kg/m2  SpO2 100%  Physical Exam patient awake, alert; left upper quadrant drain intact, insertion site okay, no increased tenderness, minimal output today  Imaging: No results found.  Labs:  CBC:  Recent Labs  04/25/14 0436 04/28/14 0413 04/30/14 0450 05/03/14 0440  WBC 9.4 9.0 10.3 9.0  HGB 7.9* 7.9* 7.9* 8.5*  HCT 24.7* 25.2* 24.7* 27.2*  PLT 537* 448* 462* 460*    COAGS:  Recent Labs  04/01/14 1400 04/08/14 0630 04/09/14 0404  INR 1.11 1.06 1.12  APTT  --  40* 33    BMP:  Recent Labs  05/01/14 0500 05/01/14 1750 05/02/14 0615 05/03/14 0440  NA 137 137 138 139  K 3.1* 3.8 3.9 3.7  CL 106  105 106 106  CO2 25 23 25 24   GLUCOSE 138* 139* 150* 98  BUN 16 18 21 22   CALCIUM 8.3* 8.6 8.6 8.6  CREATININE 1.87* 1.75* 1.70* 1.56*  GFRNONAA 27* 29* 30* 33*  GFRAA 31* 34* 35* 39*    LIVER FUNCTION TESTS:  Recent Labs  02/16/14 1443 04/01/14 1400 04/24/14 2039  04/30/14 0450 05/01/14 0500 05/02/14 0615 05/03/14 0440  BILITOT 0.42 0.7 0.5  --  0.3  --   --   --   AST 24 35 30  --  29  --   --   --   ALT 10 21 13   --  10  --   --   --   ALKPHOS 96 75 83  --  65  --   --   --   PROT 7.2 7.6 6.1  --  6.0  --   --   --   ALBUMIN 3.6 3.9 2.4*  < > 3.0* 3.2* 3.2* 3.1*  < > = values in this interval not  displayed.  Assessment and Plan: S/p LUQ abscess drainage 3/18 (post distal pancreatectomy/splenectomy/partial colectomy 04/08/14 for panc ca); patient afebrile; white blood cell count normal; creatinine 1.56; per CCS abdominal drains to remain in place for now.   Signed: Autumn Messing 05/03/2014, 3:14 PM   I spent a total of 15 minutes in face to face in clinical consultation/evaluation, greater than 50% of which was counseling/coordinating care for left upper quadrant abscess drainage

## 2014-05-03 NOTE — Progress Notes (Signed)
Physical Therapy Treatment Patient Details Name: MELIAH APPLEMAN MRN: [id number] DOB: May 06, 1945 Today's Date: 05/03/2014    History of Present Illness 69 yo female s/p lap pancreatectomy, splenectomy, partial colectomy 04/08/14. hx of pancreatic cancer, glaucoma, DVT, peripheral neuropathy    PT Comments    Pt moving better today, but continues with overall B LE weakness, limited ROM in B knees, and decreased activity tolerance. Continue to recommend SNF to maximize her rehab potential.  Follow Up Recommendations  SNF     Equipment Recommendations  None recommended by PT    Recommendations for Other Services       Precautions / Restrictions Precautions Precautions: Fall Precaution Comments: abdominal surgery/2 L drains Restrictions Weight Bearing Restrictions: No    Mobility  Bed Mobility               General bed mobility comments: Up in chair upon arrival.  Transfers Overall transfer level: Needs assistance Equipment used: 4-wheeled walker Transfers: Sit to/from Stand Sit to Stand: Min assist         General transfer comment: Cues for hand placement and proper technique.  Ambulation/Gait Ambulation/Gait assistance: Min guard;Min assist Ambulation Distance (Feet): 160 Feet (80 x 2) Assistive device: 4-wheeled walker Gait Pattern/deviations: Decreased step length - left;Decreased step length - right Gait velocity: decreased   General Gait Details: Recliner follow but did not need it today.  Pt ambulates slowly and with heavy use of UE which were shaky as she fatigued.  Pt c/o weakness in LE.   Stairs            Wheelchair Mobility    Modified Rankin (Stroke Patients Only)       Balance                                    Cognition Arousal/Alertness: Awake/alert Behavior During Therapy: WFL for tasks assessed/performed Overall Cognitive Status: Within Functional Limits for tasks assessed                       Exercises General Exercises - Lower Extremity Ankle Circles/Pumps: AROM;Both;Seated Long Arc Quad: Strengthening;Both;10 reps;Seated Hip ABduction/ADduction: AROM;Both;5 reps;Seated Hip Flexion/Marching: 5 reps;Strengthening;Seated    General Comments        Pertinent Vitals/Pain Pain Assessment: No/denies pain    Home Living                      Prior Function            PT Goals (current goals can now be found in the care plan section) Acute Rehab PT Goals Patient Stated Goal: none stated PT Goal Formulation: With patient Time For Goal Achievement: 05/10/14 Potential to Achieve Goals: Good Progress towards PT goals: Progressing toward goals;Goals met and updated - see care plan    Frequency  Min 3X/week    PT Plan Current plan remains appropriate    Co-evaluation             End of Session Equipment Utilized During Treatment: Gait belt Activity Tolerance: Patient tolerated treatment well Patient left: in chair;with call bell/phone within reach     Time: 0175-1025 PT Time Calculation (min) (ACUTE ONLY): 23 min  Charges:  $Gait Training: 8-22 mins $Therapeutic Exercise: 8-22 mins                    G Codes:  Aldeen Riga LUBECK 05/03/2014, 1:02 PM

## 2014-05-04 LAB — RENAL FUNCTION PANEL
Albumin: 3.2 g/dL — ABNORMAL LOW (ref 3.5–5.2)
Anion gap: 8 (ref 5–15)
BUN: 19 mg/dL (ref 6–23)
CHLORIDE: 105 mmol/L (ref 96–112)
CO2: 26 mmol/L (ref 19–32)
Calcium: 8.6 mg/dL (ref 8.4–10.5)
Creatinine, Ser: 1.54 mg/dL — ABNORMAL HIGH (ref 0.50–1.10)
GFR calc Af Amer: 39 mL/min — ABNORMAL LOW (ref >90)
GFR, EST NON AFRICAN AMERICAN: 34 mL/min — AB (ref >90)
GLUCOSE: 95 mg/dL (ref 70–99)
PHOSPHORUS: 3.7 mg/dL (ref 2.3–4.6)
POTASSIUM: 3.6 mmol/L (ref 3.5–5.1)
SODIUM: 139 mmol/L (ref 135–145)

## 2014-05-04 LAB — CBC
HCT: 25.9 % — ABNORMAL LOW (ref 36.0–46.0)
HEMOGLOBIN: 8.1 g/dL — AB (ref 12.0–15.0)
MCH: 29.5 pg (ref 26.0–34.0)
MCHC: 31.3 g/dL (ref 30.0–36.0)
MCV: 94.2 fL (ref 78.0–100.0)
Platelets: 476 10*3/uL — ABNORMAL HIGH (ref 150–400)
RBC: 2.75 MIL/uL — ABNORMAL LOW (ref 3.87–5.11)
RDW: 16.8 % — ABNORMAL HIGH (ref 11.5–15.5)
WBC: 8.3 10*3/uL (ref 4.0–10.5)

## 2014-05-04 MED ORDER — SUCRALFATE 1 GM/10ML PO SUSP: 1.0000 g | Freq: Three times a day (TID) | ORAL | Status: DC

## 2014-05-04 MED ORDER — TRAMADOL HCL 50 MG PO TABS: 50.0000 mg | ORAL_TABLET | Freq: Four times a day (QID) | ORAL | Status: DC | PRN

## 2014-05-04 MED ORDER — TAMSULOSIN HCL 0.4 MG PO CAPS: 0.4000 mg | ORAL_CAPSULE | Freq: Every day | ORAL | Status: DC

## 2014-05-04 MED ORDER — LOPERAMIDE HCL 2 MG PO CAPS: 2.0000 mg | ORAL_CAPSULE | Freq: Three times a day (TID) | ORAL | Status: DC

## 2014-05-04 NOTE — Discharge Instructions (Signed)
Keep drains, measure and record daily.    Foley Catheter Care A Foley catheter is a soft, flexible tube that is placed into the bladder to drain urine. A Foley catheter may be inserted if:  You leak urine or are not able to control when you urinate (urinary incontinence).  You are not able to urinate when you need to (urinary retention).  You had prostate surgery or surgery on the genitals.  You have certain medical conditions, such as multiple sclerosis, dementia, or a spinal cord injury. If you are going home with a Foley catheter in place, follow the instructions below. TAKING CARE OF THE CATHETER 1. Wash your hands with soap and water. 2. Using mild soap and warm water on a clean washcloth:  Clean the area on your body closest to the catheter insertion site using a circular motion, moving away from the catheter. Never wipe toward the catheter because this could sweep bacteria up into the urethra and cause infection.  Remove all traces of soap. Pat the area dry with a clean towel. For males, reposition the foreskin. 3. Attach the catheter to your leg so there is no tension on the catheter. Use adhesive tape or a leg strap. If you are using adhesive tape, remove any sticky residue left behind by the previous tape you used. 4. Keep the drainage bag below the level of the bladder, but keep it off the floor. 5. Check throughout the day to be sure the catheter is working and urine is draining freely. Make sure the tubing does not become kinked. 6. Do not pull on the catheter or try to remove it. Pulling could damage internal tissues. TAKING CARE OF THE DRAINAGE BAGS You will be given two drainage bags to take home. One is a large overnight drainage bag, and the other is a smaller leg bag that fits underneath clothing. You may wear the overnight bag at any time, but you should never wear the smaller leg bag at night. Follow the instructions below for how to empty, change, and clean your  drainage bags. Emptying the Drainage Bag You must empty your drainage bag when it is  - full or at least 2-3 times a day. 1. Wash your hands with soap and water. 2. Keep the drainage bag below your hips, below the level of your bladder. This stops urine from going back into the tubing and into your bladder. 3. Hold the dirty bag over the toilet or a clean container. 4. Open the pour spout at the bottom of the bag and empty the urine into the toilet or container. Do not let the pour spout touch the toilet, container, or any other surface. Doing so can place bacteria on the bag, which can cause an infection. 5. Clean the pour spout with a gauze pad or cotton ball that has rubbing alcohol on it. 6. Close the pour spout. 7. Attach the bag to your leg with adhesive tape or a leg strap. 8. Wash your hands well. Changing the Drainage Bag Change your drainage bag once a month or sooner if it starts to smell bad or look dirty. Below are steps to follow when changing the drainage bag. 1. Wash your hands with soap and water. 2. Pinch off the rubber catheter so that urine does not spill out. 3. Disconnect the catheter tube from the drainage tube at the connection valve. Do not let the tubes touch any surface. 4. Clean the end of the catheter tube with an alcohol  wipe. Use a different alcohol wipe to clean the end of the drainage tube. 5. Connect the catheter tube to the drainage tube of the clean drainage bag. 6. Attach the new bag to the leg with adhesive tape or a leg strap. Avoid attaching the new bag too tightly. 7. Wash your hands well. Cleaning the Drainage Bag 1. Wash your hands with soap and water. 2. Wash the bag in warm, soapy water. 3. Rinse the bag thoroughly with warm water. 4. Fill the bag with a solution of white vinegar and water (1 cup vinegar to 1 qt warm water [.2 L vinegar to 1 L warm water]). Close the bag and soak it for 30 minutes in the solution. 5. Rinse the bag with warm  water. 6. Hang the bag to dry with the pour spout open and hanging downward. 7. Store the clean bag (once it is dry) in a clean plastic bag. 8. Wash your hands well. PREVENTING INFECTION  Wash your hands before and after handling your catheter.  Take showers daily and wash the area where the catheter enters your body. Do not take baths. Replace wet leg straps with dry ones, if this applies.  Do not use powders, sprays, or lotions on the genital area. Only use creams, lotions, or ointments as directed by your caregiver.  For females, wipe from front to back after each bowel movement.  Drink enough fluids to keep your urine clear or pale yellow unless you have a fluid restriction.  Do not let the drainage bag or tubing touch or lie on the floor.  Wear cotton underwear to absorb moisture and to keep your skin drier. SEEK MEDICAL CARE IF:   Your urine is cloudy or smells unusually bad.  Your catheter becomes clogged.  You are not draining urine into the bag or your bladder feels full.  Your catheter starts to leak. SEEK IMMEDIATE MEDICAL CARE IF:   You have pain, swelling, redness, or pus where the catheter enters the body.  You have pain in the abdomen, legs, lower back, or bladder.  You have a fever.  You see blood fill the catheter, or your urine is pink or red.  You have nausea, vomiting, or chills.  Your catheter gets pulled out. MAKE SURE YOU:   Understand these instructions.  Will watch your condition.  Will get help right away if you are not doing well or get worse. Document Released: 01/15/2005 Document Revised: 06/01/2013 Document Reviewed: 01/07/2012 [facility] Patient Information 2015 [place], [place]. This information is not intended to replace advice given to you by your health care provider. Make sure you discuss any questions you have with your health care provider.   [facility], [place] [phone]  ABDOMINAL SURGERY: POST OP  INSTRUCTIONS  Always review your discharge instruction sheet given to you by the facility where your surgery was performed.  IF YOU HAVE DISABILITY OR FAMILY LEAVE FORMS, YOU MUST BRING THEM TO THE OFFICE FOR PROCESSING.  PLEASE DO NOT GIVE THEM TO YOUR DOCTOR.  1. A prescription for pain medication may be given to you upon discharge.  Take your pain medication as prescribed, if needed.  If narcotic pain medicine is not needed, then you may take acetaminophen (Tylenol) or ibuprofen (Advil) as needed. 2. Take your usually prescribed medications unless otherwise directed. 3. If you need a refill on your pain medication, please contact your pharmacy. They will contact our office to request authorization.  Prescriptions  will not be filled after 5pm or on week-ends. 4. You should follow a light diet the first few days after arrival home, such as soup and crackers, pudding, etc.unless your doctor has advised otherwise. A high-fiber, low fat diet can be resumed as tolerated.   Be sure to include lots of fluids daily. Most patients will experience some swelling and bruising on the chest and neck area.  Ice packs will help.  Swelling and bruising can take several days to resolve 5. Most patients will experience some swelling and bruising in the area of the incision. Ice pack will help. Swelling and bruising can take several days to resolve..  6. It is common to experience some constipation if taking pain medication after surgery.  Increasing fluid intake and taking a stool softener will usually help or prevent this problem from occurring.  A mild laxative (Milk of Magnesia or Miralax) should be taken according to package directions if there are no bowel movements after 48 hours. 7.  You may have steri-strips (small skin tapes) in place directly over the incision.  These strips should be left on the skin for 10-14 days.  If your surgeon used skin glue on the incision, you may shower in 48 hours.  The glue will  flake off over the next 2-3 weeks.  Any sutures or staples will be removed at the office during your follow-up visit. You may find that a light gauze bandage over your incision may keep your staples from being rubbed or pulled. You may shower and replace the bandage daily. 8. ACTIVITIES:  You may resume regular (light) daily activities beginning the next day--such as daily self-care, walking, climbing stairs--gradually increasing activities as tolerated.  You may have sexual intercourse when it is comfortable.  Refrain from any heavy lifting or straining until approved by your doctor. a. You may drive when you no longer are taking prescription pain medication, you can comfortably wear a seatbelt, and you can safely maneuver your car and apply brakes b. Return to Work: __________8 weeks if applicable_________________________ 52. You should see your doctor in the office for a follow-up appointment approximately two weeks after your surgery.  Make sure that you call for this appointment within a day or two after you arrive home to insure a convenient appointment time. OTHER INSTRUCTIONS:  _____________________________________________________________ _____________________________________________________________  WHEN TO CALL YOUR DOCTOR: 1. Fever over 101.0 2. Inability to urinate 3. Nausea and/or vomiting 4. Extreme swelling or bruising 5. Continued bleeding from incision. 6. Increased pain, redness, or drainage from the incision. 7. Difficulty swallowing or breathing 8. Muscle cramping or spasms. 9. Numbness or tingling in hands or feet or around lips.  The clinic staff is available to answer your questions during regular business hours.  Please dont hesitate to call and ask to speak to one of the nurses if you have concerns.  For further questions, please visit [email or web address]

## 2014-05-04 NOTE — Progress Notes (Signed)
Report called and given to [place] at [facility], patient is alert and oriented, vital signs are stable Neta Mends RN 2:59 PM 05-04-2014

## 2014-05-04 NOTE — Discharge Summary (Signed)
Physician Discharge Summary  Patient ID: [name] MRN: [id number] DOB/AGE: 68-May-1947 68 y.o.  Admit date: 04/08/2014 Discharge date: 05/04/2014  Admission Diagnoses: Patient Active Problem List   Diagnosis Date Noted  . Intra-abdominal abscess   . Malnutrition of moderate degree 04/28/2014  . Abscess of abdominal cavity   . 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 Rash of back 03/19/2014  . Family history of malignant neoplasm of breast 02/26/2014  . Family history of prostate cancer 02/26/2014  . DVT (deep venous thrombosis) 01/11/2014  . Pancreatic mass on CT 11/15 12/25/2013  . Diverticular disease 12/22/2013  . History of DVT (deep vein thrombosis) 03/01/2011  . Hx pulmonary embolism 03/01/2011  . HTN (hypertension) 03/01/2011  . Hypercholesterolemia 03/01/2011  . 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 She was sent to the stepdown unit after her surgery. She did well initially other than significant hypertension. She also had some oliguria. She did receive some in the hypertensive than fluids and this resolved. Her drain output appeared serous. She was transferred to the floor. She did have significant difficulty mobilizing and physical therapy was consulted. She started having rising leukocytosis and low-grade fevers. Since this  did not resolve she was started empirically on antibiotics and a CT scan was performed. She was found to have a left upper quadrant abscess. A drain was placed. This did grow Pseudomonas and antibiotic coverage was tapered appropriately. She experienced a bump in her creatinine and renal consult. This resolved once the medicine and vancomycin were stopped and she was converted to Cipro. She started doing better with physical therapy. She had some gastroparesis and had significant difficulty initially eating. She did require TNA briefly. Once her caloric intake and fluid intake was adequate, she was stable for discharge. Of note, she did have significant urinary retention and required Foley reinsertion 3 times. She does live alone and does not have adequate help at home. She will be discharged to a nursing facility temporarily for therapy, drain care, and assistance with ADLs. Labs are stable at time of discharge. 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  Consults: nephrology, urology and nutrition, physical therapy  Significant Diagnostic Studies: labs: see epic  Treatments: IV hydration, antibiotics: vancomycin, Zosyn and Cipro, anticoagulation: LMW heparin and surgery: see above.    Discharge Exam: Blood pressure 133/71, pulse 91, temperature 98.8 F (37.1 C), temperature source Oral, resp. rate 18, height 5' 9.5" (1.765 m), weight 92.9 kg (204 lb 12.9 oz), SpO2 97 %. General appearance: alert and cooperative Chest wall: no tenderness Cardio: regular rate and rhythm GI: soft, non distended, approp tender.  drains wtih old hematoma Extremities: edema +2 edema  Disposition: 01-Home or Self Care      Discharge Instructions    Call MD for:  difficulty breathing, headache or visual  disturbances    Complete by:  As directed      Call MD for:  persistant nausea and vomiting    Complete by:   As directed      Call MD for:  redness, tenderness, or signs of infection (pain, swelling, redness, odor or green/yellow discharge around incision site)    Complete by:  As directed      Call MD for:  severe uncontrolled pain    Complete by:  As directed      Call MD for:  temperature >100.4    Complete by:  As directed      Diet - low sodium heart healthy    Complete by:  As directed      Increase activity slowly    Complete by:  As directed      Lifting restrictions    Complete by:  As directed   6 weeks            Medication List    STOP taking these medications        HYDROcodone-acetaminophen 5-325 MG per tablet  Commonly known as:  NORCO/VICODIN      TAKE these medications        acetaminophen 500 MG tablet  Commonly known as:  TYLENOL  Take 500 mg by mouth every 6 (six) hours as needed (Pain).     alum & mag hydroxide-simeth 007-622-63 MG/5ML suspension  Commonly known as:  MAALOX PLUS  Take 30 mLs by mouth every 6 (six) hours as needed for indigestion.     atorvastatin 40 MG tablet  Commonly known as:  LIPITOR  TAKE 1 TABLET BY MOUTH DAILY     brimonidine-timolol 0.2-0.5 % ophthalmic solution  Commonly known as:  COMBIGAN  Place 1 drop into the left eye every 12 (twelve) hours.     CARTIA XT 120 MG 24 hr capsule  Generic drug:  diltiazem  TAKE ONE CAPSULE BY MOUTH DAILY     doxycycline 100 MG tablet  Commonly known as:  VIBRA-TABS  Take 1 tablet (100 mg total) by mouth 2 (two) times daily.     enoxaparin 100 MG/ML injection  Commonly known as:  LOVENOX  Inject 1 mL (100 mg total) into the skin every 12 (twelve) hours.     gabapentin 300 MG capsule  Commonly known as:  NEURONTIN  TAKE ONE CAPSULE BY MOUTH AT BEDTIME; AFTER 7 DAYS, MAY INCREASE TO 2 CAPSULES AT BEDTIME     latanoprost 0.005 % ophthalmic solution  Commonly known as:  XALATAN  Place 1 drop into both eyes at bedtime.     loperamide 2 MG capsule  Commonly known as:  IMODIUM  Take 1  capsule (2 mg total) by mouth 3 (three) times daily before meals.     pantoprazole 40 MG tablet  Commonly known as:  PROTONIX  TAKE 1 TABLET BY MOUTH DAILY     primidone 50 MG tablet  Commonly known as:  MYSOLINE  Take 50 mg by mouth daily. Patient takes at nite     propranolol ER 120 MG 24 hr capsule  Commonly known as:  INDERAL LA  TAKE ONE CAPSULE BY MOUTH DAILY     sucralfate 1 GM/10ML suspension  Commonly known as:  CARAFATE  Take 10 mLs (1 g total) by mouth 4 (four) times daily -  with meals and at bedtime.     tamsulosin 0.4 MG Caps capsule  Commonly known as:  FLOMAX  Take 1  capsule (0.4 mg total) by mouth daily.     traMADol 50 MG tablet  Commonly known as:  ULTRAM  Take 1 tablet (50 mg total) by mouth every 6 (six) hours as needed.       Follow-up Information    Follow up with [facility], MATTHEW, MD.   Specialty:  Urology   Why:  2-3 weeks   Contact information:   [place] [address] [phone]       Follow up with [facility], MD In 2 weeks.   Specialty:  General Surgery   Contact information:   [address] [place] [address] [phone]       Signed: [name] 05/04/2014, 1:28 PM

## 2014-05-04 NOTE — Progress Notes (Signed)
Clinical Social Work Department CLINICAL SOCIAL WORK PLACEMENT NOTE 05/04/2014  Patient:  [name], [name]  Account Number:  [id number] Admit date:  04/08/2014  Clinical Social Worker:  Werner Lean, LCSW  Date/time:  05/04/2014 03:06 PM  Clinical Social Work is seeking post-discharge placement for this patient at the following level of care:   SKILLED NURSING   (*CSW will update this form in Epic as items are completed)   04/16/2014  Patient/family provided with [place] Department of Clinical Social Work's list of facilities offering this level of care within the geographic area requested by the patient (or if unable, by the patient's family).  04/16/2014  Patient/family informed of their freedom to choose among providers that offer the needed level of care, that participate in Medicare, Medicaid or managed care program needed by the patient, have an available bed and are willing to accept the patient.    Patient/family informed of [facility]' ownership interest in [facility], as well as of the fact that they are under no obligation to receive care at this facility.  PASARR submitted to EDS on 04/16/2014 PASARR number received on 04/16/2014  FL2 transmitted to all facilities in geographic area requested by pt/family on  04/16/2014 FL2 transmitted to all facilities within larger geographic area on   Patient informed that his/her managed care company has contracts with or will negotiate with  certain facilities, including the following:     Patient/family informed of bed offers received:  04/16/2014 Patient chooses bed at [facility], [place] Physician recommends and patient chooses bed at    Patient to be transferred to [facility], [place] on  05/04/2014 Patient to be transferred to facility by [place] Patient and family notified of transfer on 05/04/2014 Name of family member notified:  DAUGHTER  The following physician request were entered  in Epic:   Additional Comments: Pt / daughter are in agreement with d/c to SNF today. Pt requesting to transport by car. NSG reviewed d/c summary, scripts, avs. Scripts included in d/c packet. Packet provided to pt prior to d/c.  Werner Lean LCSW [phone]

## 2014-05-05 ENCOUNTER — Telehealth: Payer: Self-pay | Admitting: *Deleted

## 2014-05-05 ENCOUNTER — Non-Acute Institutional Stay (SKILLED_NURSING_FACILITY): Payer: Medicare Other | Admitting: Adult Health

## 2014-05-05 ENCOUNTER — Encounter: Payer: Self-pay | Admitting: Adult Health

## 2014-05-05 DIAGNOSIS — I10 Essential (primary) hypertension: Secondary | ICD-10-CM

## 2014-05-05 DIAGNOSIS — C252 Malignant neoplasm of tail of pancreas: Secondary | ICD-10-CM | POA: Diagnosis not present

## 2014-05-05 DIAGNOSIS — G629 Polyneuropathy, unspecified: Secondary | ICD-10-CM | POA: Diagnosis not present

## 2014-05-05 DIAGNOSIS — R339 Retention of urine, unspecified: Secondary | ICD-10-CM

## 2014-05-05 DIAGNOSIS — K651 Peritoneal abscess: Secondary | ICD-10-CM | POA: Diagnosis not present

## 2014-05-05 DIAGNOSIS — IMO0001 Reserved for inherently not codable concepts without codable children: Secondary | ICD-10-CM

## 2014-05-05 DIAGNOSIS — G250 Essential tremor: Secondary | ICD-10-CM | POA: Diagnosis not present

## 2014-05-05 DIAGNOSIS — K219 Gastro-esophageal reflux disease without esophagitis: Secondary | ICD-10-CM

## 2014-05-05 DIAGNOSIS — H409 Unspecified glaucoma: Secondary | ICD-10-CM

## 2014-05-05 DIAGNOSIS — I82402 Acute embolism and thrombosis of unspecified deep veins of left lower extremity: Secondary | ICD-10-CM | POA: Diagnosis not present

## 2014-05-05 MED ORDER — DILTIAZEM HCL ER COATED BEADS 120 MG PO CP24: 120.0000 mg | ORAL_CAPSULE | Freq: Every day | ORAL | Status: DC

## 2014-05-05 MED ORDER — ATORVASTATIN CALCIUM 40 MG PO TABS: 40.0000 mg | ORAL_TABLET | Freq: Every day | ORAL | Status: DC

## 2014-05-05 NOTE — Telephone Encounter (Signed)
Pt was on tcm list D/C 05/04/14 had pancreas cancer of tail s/p distal pancreatectomy, splenectomy & partial colectomy on 04/08/14. Pt will f/u with surgeon Dr. [name] & oncologist Dr. Collier Flowers in 2 wks...[name]

## 2014-05-05 NOTE — Progress Notes (Addendum)
Patient ID: [name], female   DOB: Aug 22, 1945, 69 y.o.   MRN: [id number]  [facility]     Allergies  Allergen Reactions  . Sulfa Antibiotics Swelling       Chief Complaint  Patient presents with  . Hospitalization Follow-up    HPI:  She has been hospitalized for her pancreatic cancer. Is status post distal pancreatectomy, splenectomy and partial colectomy on 04-08-14. She has had an abdominal abscess post po. She is here for short term rehab with her goal to return back home as soon as she is able. She will be following up oncology; urology for her urine retention and surgeon as directed.    Past Medical History  Diagnosis Date  . Glaucoma   . Clotting disorder   . DVT (deep venous thrombosis)     LEFT  . GERD (gastroesophageal reflux disease)   . Hypercholesterolemia   . Peripheral neuropathy   . Essential tremor   . Diverticulosis   . Arthritis     knees and back  . Cancer     Pancreatic  . Hypertension   . Colon polyps   . UTI (lower urinary tract infection)   . Complication of anesthesia     slow to wak up   . Pulmonary embolism     Past Surgical History  Procedure Laterality Date  . Cholecystectomy    . Abdominal hysterectomy    . Knee surgery Bilateral   . Greenfield filter    . Back surgery    . Eus N/A 02/26/2014    Procedure: UPPER ENDOSCOPIC ULTRASOUND (EUS) LINEAR;  Surgeon: Beryle Beams, MD;  Location: [facility];  Service: Endoscopy;  Laterality: N/A;  . Fine needle aspiration N/A 02/26/2014    Procedure: FINE NEEDLE ASPIRATION (FNA) LINEAR;  Surgeon: Beryle Beams, MD;  Location: [facility];  Service: Endoscopy;  Laterality: N/A;  . Breast lumpectomy Right   . Spine surgery    . Xi robotic assisted laparoscopic distal pancreatectomy N/A 04/08/2014    Procedure: XI ROBOTIC ASSISTED CONVERTED LAPAROSCOPIC HAND ASSITED DISTAL PANCREATECTOMY/SPLENECTOMY WITH PARTIAL COLECTOMY;  Surgeon: [name], MD;  Location: [facility];  Service:  General;  Laterality: N/A;    VITAL SIGNS BP 119/80 mmHg  Pulse 79  Ht 5' 9.5" (1.765 m)  Wt 204 lb 12 oz (92.874 kg)  BMI 29.81 kg/m2   Outpatient Encounter Prescriptions as of 05/05/2014  Medication Sig  . doxycycline (VIBRA-TABS) 100 MG tablet Take 1 tablet (100 mg total) by mouth 2 (two) times daily.  Marland Kitchen enoxaparin (LOVENOX) 100 MG/ML injection Inject 1 mL (100 mg total) into the skin every 12 (twelve) hours.  . gabapentin (NEURONTIN) 300 MG capsule TAKE ONE CAPSULE BY MOUTH AT BEDTIME; AFTER 7 DAYS, MAY INCREASE TO 2 CAPSULES AT BEDTIME  . latanoprost (XALATAN) 0.005 % ophthalmic solution Place 1 drop into both eyes at bedtime.  Marland Kitchen loperamide (IMODIUM) 2 MG capsule Take 1 capsule (2 mg total) by mouth 3 (three) times daily before meals.  . pantoprazole (PROTONIX) 40 MG tablet TAKE 1 TABLET BY MOUTH DAILY  . primidone (MYSOLINE) 50 MG tablet Take 50 mg by mouth daily. Patient takes at nite  . propranolol ER (INDERAL LA) 120 MG 24 hr capsule TAKE ONE CAPSULE BY MOUTH DAILY (Patient taking differently: TAKE ONE CAPSULE BY MOUTH DAILY AT BEDTIME)  . sucralfate (CARAFATE) 1 GM/10ML suspension Take 10 mLs (1 g total) by mouth 4 (four) times daily -  with meals and at  bedtime.  . tamsulosin (FLOMAX) 0.4 MG CAPS capsule Take 1 capsule (0.4 mg total) by mouth daily.   lipitor 40 mg  Take 40 mg daily    Diltiazem ER 120 mg  Take 120 mg daily   . traMADol (ULTRAM) 50 MG tablet Take 1 tablet (50 mg total) by mouth every 6 (six) hours as needed.     SIGNIFICANT DIAGNOSTIC EXAMS  12-18-13: bilateral lower extremity doppler: - Findings consistent with chronic deep vein thrombosis involving the popliteal vein in the right lower extremity. - No evidence of superficial thrombosis involving the right lowerextremity. - Findings consistent with acute deep vein thrombosis involving the posterior tibial vein coursing through the popliteal, femoral, and common femoral veins in the left lower extremity. -  There is evidence of a superficial thrombosis of the left proximal greater saphenous vein coursing into the and including the saphenofemoral junction.  04-15-14: ct of abdomen and pelvis: Status post splenectomy and distal pancreatectomy. 5.9 x 3.1 cm fluid collection is seen superior and medial to upper pole of left kidney as well as 6.5 x 5.1 cm left sub diaphoretic fluid collection with surgical drain present within it. These are concerning for probable abscesses. Moderate left pleural effusion is noted with adjacent atelectasis of the left lower lobe.  04-16-14: ct guided percutaneous drain: Successful CT guided placement of a 10 French all purpose drain catheter into the dominant abscess/fluid collection within the left upper abdominal quadrant, anterior to the left kidney with aspiration of 25 mL of purulent fluid. Samples were sent to the laboratory as requested by the ordering clinical team.  04-17-14: chest x-ray: Dense consolidation in the left base and small left pleural effusion.  04-20-14: renal ultrasound: left renal cyst  04-24-14; ct of abdomen and pelvis: 1. The patient is status post splenectomy and distal pancreatectomy. A percutaneous drainage catheter is noted in left flank with tip in left upper quadrant anterior to upper pole of the left kidney. There is significant improvement from prior exam. The previous subdiaphragmatic fluid has resolved. The collection anterior to left kidney has resolved. 2. There is moderate left pleural effusion with left lower lobe atelectasis. 3. No new abscess collection is noted. Only tiny amount of residual fluid/air is noted just anterior to the surgical drain in left upper abdomen axial image 14 measures about 2.4 cm 4. Stable postsurgical changes at the site of distal pancreatectomy. 5. IVC filter in place again noted. 6. No small bowel or colonic obstruction. 7. There is Foley catheter in decompressed urinary bladder. Moderate air is noted  anterior aspect of the bladder probable post instrumentation.  04-29-14: ct of abdomen and pelvis: 1. Postoperative changes of distal pancreatectomy with stable pseudocyst or seroma adjacent to the surgical margin. 2. Resolved left upper quadrant abscess with 2 percutaneous drains in place. 3. Small pelvic free fluid. 4. Moderate left pleural effusion with likely compressive atelectasis in the left lower lobe. Difficult to definitively exclude pneumonia.      LABS REVIEWED:   04-14-14: wbc 15.8; hgb 7.6; hct 22.9; mcv 91.6; plt 312; glucose 144; bun <5; creat 0.55; k+3.5; na++136 04-18-14: wbc 20.0; hgb 7.4; hct 22.7; mcv 92.3; plt 501; glucose 122; bun 7.4; creat 2.59; k+3.8; na++135 04-21-14: wbc 12.5; hgb 8.7; hct 26.8; mcv 92.4; plt 498; glucose 120; bun 21; creat 3.94; k+3.8; na++136;  04-24-14: wbc 9.1; hgb 8.6; hct 26.5; mcv 92.7; plt 548; glucose 96; bun 23; creat 3.84; k+3.7; na++135 04-30-14: wbc 10.3; hgb 7.9; hct  24.7; mcv 92.5; plt 462; glucose 127; bun 14; creat 2.23; k+3.0; na++139; liver normal albumin 3.0; phos 2.8; mag 1.8 trig 82 05-03-14; wbc 9.0; hgb 8.5; hct 27.2; mcv 93.8; plt 460; glucose 98; bun 22; creat 1.56; k+3.7; na++139; albumin 3.1; phos 3.7; mag 1.8      Review of Systems  Constitutional: Negative for malaise/fatigue.  Respiratory: Negative for cough and shortness of breath.   Cardiovascular: Positive for leg swelling. Negative for chest pain and palpitations.       Is greatly improved   Gastrointestinal: Negative for heartburn, abdominal pain and constipation.  Genitourinary:       Has foley   Musculoskeletal: Negative for myalgias and joint pain.  Skin: Negative.        Has drains X2  Psychiatric/Behavioral: Negative for depression. The patient is not nervous/anxious.      Physical Exam  Constitutional: She is oriented to person, place, and time. No distress.  Overweight   Neck: Neck supple. No JVD present. No thyromegaly present.  Cardiovascular:  Normal rate, regular rhythm and intact distal pulses.   Respiratory: Effort normal and breath sounds normal. No respiratory distress.  GI: Soft. Bowel sounds are normal. She exhibits no distension. There is no tenderness.  JP drain X2 present   Musculoskeletal: Normal range of motion.  Has 1+ lower extremity edema present   Neurological: She is alert and oriented to person, place, and time.  Skin: Skin is warm and dry. She is not diaphoretic.       ASSESSMENT/ PLAN:  1. Pancreatic cancer:  Is status post distal pancreatectomy splenectomy and partial colectomy. Will follow up with surgeon and oncology as directed; will continue therapy as directed to improve upon strength gait; balance; and adl retraining. She has JP drain X 2 . Will continue ultram 50 mg every 6 hours as needed for pain and will monitor her status.   2. Abdominal abscess: will continue doxycycline for a total of 20 doses post hospitalization and will monitor   3.  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 She is on lovenox 100 mg twice daily and will monitor   4. Urinary retention: she failed foley removal three times; presently has foley; will continue to be followed by urology; will continue flomax 0.4 mg daily and will monitor   5. GERD: will continue protonix 40 mg daily and carafate 1gm four times daily   6. Diarrhea: due to partial colectomy will continue imodium 2 mg prior to meals will monitor   7. Hypertension: will continue inderal LA 120 mg daily and cardizem LA 120 mg daily will monitor  8. Peripheral neuropathy: is stable will continue neurontin 300 mg nightly for one week then 600 mg nightly will monitor   9. Essential tremor: will continue primidone 50 mg nightly and will monitor   10. Glaucoma: will continue xalatan to both eyes nightly   11. Dyslipidemia: continue lipitor 40 mg daily   Time spent with patient 50 minutes.      Ok Edwards NP [facility]  Contact  [phone] Monday through Friday 8am- 5pm  After hours call [phone]

## 2014-05-05 NOTE — Addendum Note (Signed)
Addended by: Gerlene Fee on: 05/05/2014 03:29 PM   Modules accepted: Orders

## 2014-05-06 ENCOUNTER — Telehealth: Payer: Self-pay | Admitting: Oncology

## 2014-05-06 ENCOUNTER — Other Ambulatory Visit (HOSPITAL_BASED_OUTPATIENT_CLINIC_OR_DEPARTMENT_OTHER): Payer: Medicare Other

## 2014-05-06 ENCOUNTER — Non-Acute Institutional Stay (SKILLED_NURSING_FACILITY): Payer: Medicare Other | Admitting: Internal Medicine

## 2014-05-06 ENCOUNTER — Telehealth: Payer: Self-pay | Admitting: Hematology

## 2014-05-06 ENCOUNTER — Ambulatory Visit (HOSPITAL_BASED_OUTPATIENT_CLINIC_OR_DEPARTMENT_OTHER): Payer: Medicare Other | Admitting: Hematology

## 2014-05-06 VITALS — BP 113/52 | HR 84 | Temp 98.0°F | Resp 18 | Ht 69.0 in | Wt 201.0 lb

## 2014-05-06 DIAGNOSIS — Z7901 Long term (current) use of anticoagulants: Secondary | ICD-10-CM | POA: Diagnosis not present

## 2014-05-06 DIAGNOSIS — I82531 Chronic embolism and thrombosis of right popliteal vein: Secondary | ICD-10-CM

## 2014-05-06 DIAGNOSIS — I82442 Acute embolism and thrombosis of left tibial vein: Secondary | ICD-10-CM | POA: Diagnosis not present

## 2014-05-06 DIAGNOSIS — Z86718 Personal history of other venous thrombosis and embolism: Secondary | ICD-10-CM

## 2014-05-06 DIAGNOSIS — G629 Polyneuropathy, unspecified: Secondary | ICD-10-CM

## 2014-05-06 DIAGNOSIS — K219 Gastro-esophageal reflux disease without esophagitis: Secondary | ICD-10-CM | POA: Diagnosis not present

## 2014-05-06 DIAGNOSIS — I10 Essential (primary) hypertension: Secondary | ICD-10-CM | POA: Diagnosis not present

## 2014-05-06 DIAGNOSIS — I82402 Acute embolism and thrombosis of unspecified deep veins of left lower extremity: Secondary | ICD-10-CM

## 2014-05-06 DIAGNOSIS — C259 Malignant neoplasm of pancreas, unspecified: Secondary | ICD-10-CM

## 2014-05-06 DIAGNOSIS — C252 Malignant neoplasm of tail of pancreas: Secondary | ICD-10-CM

## 2014-05-06 DIAGNOSIS — G250 Essential tremor: Secondary | ICD-10-CM | POA: Diagnosis not present

## 2014-05-06 DIAGNOSIS — K651 Peritoneal abscess: Secondary | ICD-10-CM

## 2014-05-06 DIAGNOSIS — IMO0001 Reserved for inherently not codable concepts without codable children: Secondary | ICD-10-CM

## 2014-05-06 MED ORDER — ENOXAPARIN SODIUM 120 MG/0.8ML ~~LOC~~ SOLN: 120.0000 mg | SUBCUTANEOUS | Status: DC

## 2014-05-06 NOTE — Telephone Encounter (Signed)
Gave relative avs report and appointments for April. °

## 2014-05-06 NOTE — Telephone Encounter (Signed)
Gave relative avs report and appointments for April and May °

## 2014-05-06 NOTE — Progress Notes (Signed)
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 CT chest was negative.    02/26/2014 Pathology Results FNA of pancreatic mass showed adenocarcinoma    03/22/2014 Initial Diagnosis Pancreatic adenocarcinoma   04/08/2014 Surgery robotic converted to hand-assisted laparoscopic distal pancreatectomy, splenectomy, partial colectomy, and partial removal of the left renal capsule for pancreatic adenocarcinoma. Surgical margins were negative. pT3N0   04/08/2014 - 05/04/2014 Hospital Admission admitted for pancreatic surgery, complicated with abdominal abcess, which was drained and treated with abx for pseudomonas infection, difficulty with eating and ambulatory, malnutrition, was discharged to rehab     04/08/2014 Pathology Results pT3N0, moderately differentiated invasive adenocarcinoma, spanning 4.5 cm, invading colonic submucosa. Lymphovascular invasion is identified, perineural invasion is identified. 10 lymph nodes were negative. Surgical margins were negative.    HISTORY OF PRESENTING ILLNESS:  [name] 69 y.o. female is here because of her recurrent DVT and uric  discovered pancreatic mass.  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 She had IVC filter placed back then and put on coumadin. She had second episode of right leg DVT when she was on coumadin in 2009, and continued coumadin until one year ago when she had trouble for monitoring her INR. She was then switched to Aspirin 81mg  dialy. 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 CT abdomen was ordered to further evaluate the DVT and abdominal pain on 12/22/2013, which showed thrombus in IVC and right common iliac vein below IVC. CT scan also showed a 2.8 x 2.5 cm mass within the tail of pancreas, this was confirmed by the MRI abdomen, with probably encasement of the splenic vessels. No significant abdominal adenopathy. CT of the chest was negative for distant metastasis. She was referred to see GI Dr.Hung and will have EGD/EUS for pancreatic mass biopsy in a week.   She has been having abdominal pain after eating in the pat few month, along with diarrhea and constipation, which has been chronic since her cholecystectomy in 199. Her appetite is fare, and weight is stable. She also has tremor since teenager, and got worse lately, along with gait instaedyness, was seen by neurologist Dr. Carles Collet. She was put on primidone which has help quite bit.   She had both knee surgery and was using a crane until recently when she was diagnosed with DVT and she has been using a walker. She has been having some gait instability lately, she was referred by  Dr. Carles Collet and MRI of brain showed mild chronic small vessel ischemia disease and cerebral atrophy.  INTERIM HISTORY [name] returns for follow-up. She underwent laparoscopic distal pancreatectomy, splenectomy, partial colectomy and  partial removal of the left renal capsules for pancreatic adenocarcinoma. She tolerated surgery well, but had a prolonged hospital stay, due to the abdominal abscess. She also had acute kidney injury from ATN secondary to contrast and IV bank. Her renal function improved.She was finally discharged to a nursing home 2 days ago. She still has two drainage tube. Her pain is controlled. Her appetite is still low, eats small meals.   MEDICAL HISTORY:  Past Medical History  Diagnosis Date  . Glaucoma   . Clotting disorder   . DVT (deep venous thrombosis)     LEFT  . GERD (gastroesophageal reflux disease)   . Hypercholesterolemia   . Peripheral neuropathy   . Essential tremor   . Diverticulosis   . Arthritis     knees and back  . Cancer     Pancreatic  . Hypertension   . Colon polyps   . UTI (lower urinary tract infection)   . Complication of anesthesia     slow to wak up   . Pulmonary embolism     SURGICAL HISTORY: Past Surgical History  Procedure Laterality Date  . Cholecystectomy    . Abdominal hysterectomy    . Knee surgery Bilateral   . [place] filter    . Back surgery    . Eus N/A 02/26/2014    Procedure: UPPER ENDOSCOPIC ULTRASOUND (EUS) LINEAR;  Surgeon: Beryle Beams, MD;  Location: [facility];  Service: Endoscopy;  Laterality: N/A;  . Fine needle aspiration N/A 02/26/2014    Procedure: FINE NEEDLE ASPIRATION (FNA) LINEAR;  Surgeon: Beryle Beams, MD;  Location: [facility];  Service: Endoscopy;  Laterality: N/A;  . Breast lumpectomy Right   . Spine surgery    . Xi robotic assisted laparoscopic distal pancreatectomy N/A 04/08/2014    Procedure: XI ROBOTIC ASSISTED CONVERTED LAPAROSCOPIC HAND ASSITED DISTAL PANCREATECTOMY/SPLENECTOMY WITH PARTIAL COLECTOMY;  Surgeon: [name], MD;  Location: [facility];  Service: General;  Laterality: N/A;    SOCIAL HISTORY: History   Social History  . Marital Status: Divorced    Spouse Name: N/A    Number of Children: 3  .  Years of Education: N/A   Occupational History   She used to work for [employer], retired now    Social History Main Topics  . Smoking status: Current Every Day Smoker -- 0.50 packs/day, for 40 years     Types: Cigarettes  . Smokeless tobacco: Not on file  . Alcohol Use: 0.0 oz/week, social drinker     0 Not specified per week     Comment: occ- glass of wine a week   . Drug Use: No  . Sexual Activity: Not on file   Other Topics Concern  . Not on file   Social History Narrative   Divorced, 3 children   Right handed   Associates degree   3 per week    FAMILY HISTORY: Family History  Problem Relation Age of Onset  . Diabetes Father 84  . Diabetes Maternal Grandmother   . Hypertension Mother   . Glaucoma Sister   . Cataracts Maternal Grandmother   . Cancer Sister 38    Breast    ALLERGIES:  is allergic to sulfa antibiotics.  MEDICATIONS:  No current facility-administered medications for this visit.  Current Outpatient Prescriptions  Medication Sig Dispense Refill  . atorvastatin (LIPITOR) 40 MG tablet Take 1 tablet (40 mg total) by mouth daily. 90 tablet 0  . ciprofloxacin (CIPRO) 500 MG tablet Take 500 mg by mouth 2 (two) times daily.    Marland Kitchen diltiazem (CARTIA XT) 120 MG 24 hr capsule Take 1 capsule (120 mg total) by mouth daily. 90 capsule 0  . gabapentin (NEURONTIN) 300 MG capsule TAKE ONE CAPSULE BY MOUTH AT BEDTIME; AFTER 7 DAYS, MAY INCREASE TO 2 CAPSULES AT BEDTIME 60 capsule 2  . latanoprost (XALATAN) 0.005 % ophthalmic solution Place 1 drop into both eyes at bedtime.    Marland Kitchen loperamide (IMODIUM) 2 MG capsule Take 1 capsule (2 mg total) by mouth 3 (three) times daily before meals. 30 capsule 0  . pantoprazole (PROTONIX) 40 MG tablet TAKE 1 TABLET BY MOUTH DAILY 30 tablet 2  . primidone (MYSOLINE) 50 MG tablet Take 50 mg by mouth daily. Patient takes at nite    . propranolol ER (INDERAL LA) 120 MG 24 hr capsule TAKE ONE CAPSULE BY MOUTH DAILY (Patient  taking differently: TAKE ONE CAPSULE BY MOUTH DAILY AT BEDTIME) 30 capsule 2  . sucralfate (CARAFATE) 1 GM/10ML suspension Take 10 mLs (1 g total) by mouth 4 (four) times daily -  with meals and at bedtime. 420 mL 0  . tamsulosin (FLOMAX) 0.4 MG CAPS capsule Take 1 capsule (0.4 mg total) by mouth daily. 30 capsule 0  . traMADol (ULTRAM) 50 MG tablet Take 1 tablet (50 mg total) by mouth every 6 (six) hours as needed. 60 tablet 0  . enoxaparin (LOVENOX) 120 MG/0.8ML injection Inject 0.8 mLs (120 mg total) into the skin daily. 30 Syringe 0   Facility-Administered Medications Ordered in Other Visits  Medication Dose Route Frequency Provider Last Rate Last Dose  . HYDROmorphone (DILAUDID) injection 1 mg  1 mg Intramuscular Once Lacretia Leigh, MD        REVIEW OF SYSTEMS:   Constitutional: Denies fevers, chills or abnormal night sweats, (+) fatigue  Eyes: Denies blurriness of vision, double vision or watery eyes Ears, nose, mouth, throat, and face: Denies mucositis or sore throat Respiratory: Denies cough, dyspnea or wheezes Cardiovascular: Denies palpitation, chest discomfort or lower extremity swelling Gastrointestinal:  Denies nausea, heartburn or change in bowel habits. (+) intermittent abdominal pain  Skin: Denies abnormal skin rashes Lymphatics: Denies new lymphadenopathy or easy bruising Neurological:Denies numbness, tingling or new weaknesses Behavioral/Psych: Mood is stable, no new changes  Extremities: Bilateral lower extremity edema and leg pain, left more than right. All other systems were reviewed with the patient and are negative.  PHYSICAL EXAMINATION: ECOG PERFORMANCE STATUS: 3 - Symptomatic, >50% confined to bed  Filed Vitals:   05/06/14 1410  BP: 113/52  Pulse: 84  Temp: 98 F (36.7 C)  Resp: 18   Filed Weights   05/06/14 1410  Weight: 201 lb (91.173 kg)    GENERAL:alert, no distress and comfortable SKIN: skin color, texture, turgor are normal, no rashes. She has  some bruises on her abdomen at the Lovenox injection site. She also has a 2 cm subcutaneous nodule on the right flank area, likely a hematoma. EYES: normal, conjunctiva are pink and non-injected, sclera clear OROPHARYNX:no exudate, no erythema and lips, buccal mucosa, and tongue normal  NECK: supple, thyroid normal size, non-tender, without nodularity LYMPH:  no palpable lymphadenopathy in the cervical, axillary or inguinal LUNGS: clear to auscultation and percussion with normal breathing effort HEART: regular rate &  rhythm and no murmurs and no lower extremity edema ABDOMEN:abdomen soft, non-tender and normal bowel sounds Musculoskeletal:no cyanosis of digits and no clubbing  PSYCH: alert & oriented x 3 with fluent speech NEURO: no focal motor/sensory deficits Ext: (+) Bilateral Ankle swelling  LABORATORY DATA:  I have reviewed the data as listed Lab Results  Component Value Date   WBC 8.3 05/04/2014   HGB 8.1* 05/04/2014   HCT 25.9* 05/04/2014   MCV 94.2 05/04/2014   PLT 476* 05/04/2014    Recent Labs  04/01/14 1400  04/24/14 2039  04/30/14 0450  05/02/14 0615 05/03/14 0440 05/04/14 0415  NA 142  < >  --   < > 139  < > 138 139 139  K 4.4  < >  --   < > 3.0*  < > 3.9 3.7 3.6  CL 107  < >  --   < > 108  < > 106 106 105  CO2 28  < >  --   < > 24  < > 25 24 26   GLUCOSE 107*  < >  --   < > 127*  < > 150* 98 95  BUN 9  < >  --   < > 14  < > 21 22 19   CREATININE 0.66  < >  --   < > 2.23*  < > 1.70* 1.56* 1.54*  CALCIUM 9.5  < >  --   < > 8.6  < > 8.6 8.6 8.6  GFRNONAA 89*  < >  --   < > 22*  < > 30* 33* 34*  GFRAA >90  < >  --   < > 25*  < > 35* 39* 39*  PROT 7.6  --  6.1  --  6.0  --   --   --   --   ALBUMIN 3.9  --  2.4*  < > 3.0*  < > 3.2* 3.1* 3.2*  AST 35  --  30  --  29  --   --   --   --   ALT 21  --  13  --  10  --   --   --   --   ALKPHOS 75  --  83  --  65  --   --   --   --   BILITOT 0.7  --  0.5  --  0.3  --   --   --   --   BILIDIR  --   --  <0.1  --   --   --    --   --   --   IBILI  --   --  NOT CALCULATED  --   --   --   --   --   --   < > = values in this interval not displayed.   PATHOLOGY REPORT 04/08/2014 REASON FOR ADDENDUM, AMENDMENT OR CORRECTION: [id number]: AMENDMENT: Amendment issued to report additional gross information in part #2. ds 04/14/14 04:55:45 PM FINAL DIAGNOSIS Diagnosis 1. Colon, segmental resection, transverse - PANCREATIC ADENOCARCINOMA INVADING COLONIC SUBMUCOSA. - LYMPHOVASCULAR INVASION IS IDENTIFIED. - THE COLONIC RESECTION MARGINS ARE NEGATIVE FOR ADENOCARCINOMA. 2. Pancreas, resection - INVASIVE ADENOCARCINOMA, MODERATELY DIFFERENTIATED, SPANNING 4.5 CM. - LYMPHOVASCULAR INVASION IS IDENTIFIED. - PERINEURAL INVASION IS IDENTIFIED. - ADENOCARCINOMA IS BROADLY PRESENT AT T 10 mg 1 negative. Surgical margins were negative. HE CLINICAL COLONIC MARGIN OF SPECIMEN #2. - ADENOCARCINOMA IS ADHERENT TO THE RENAL CAPSULE,  BUT DOES NOT INVOLVE RENAL PARENCHYMA. - ADENOCARCINOMA IS ADHERENT TO THE SPLENIC CAPSULE, BUT DOES NOT INVOLVE SPLENIC PARENCHYMA. - THE PANCREATIC PARENCHYMAL RESECTION MARGIN IS NEGATIVE FOR ADENOCARCINOMA. - THERE IS NO EVIDENCE OF CARCINOMA IN 10 OF 10 LYMPH NODES (0/10). - SEE ONCOLOGY TABLE BELOW. Microscopic Comment 2. PANCREAS (EXOCRINE): Procedure: Distal pancreatectomy with adherent renal capsule and portion of spleen and transverse colon resection. Tumor Site: Distal pancreatic tail. Tumor Size: 4.5 cm (gross measurement). Histologic Type: Adenocarcinoma. Histologic Grade (ductal carcinoma only): G2: Moderately differentiated Microscopic Tumor Extension: Adenocarcinoma involves colonic submucosa, renal capsule, and splenic capsule. Margins: The pancreatic parenchymal margin is negative for adenocarcinoma. Distance of invasive carcinoma from closest margin: 5.2 cm to the pancreatic parenchymal margin, see comment. Carcinoma in situ / high-grade dysplasia: Not identified. Treatment  Effect: N/A. 1 of 3 Amended copy Amended FINAL for Klinger, Quinnley A ([id number]) Microscopic Comment(continued) Lymph-Vascular Invasion: Present, diffuse Perineural Invasion: Present, diffuse Lymph nodes: number examined 10; number positive: 0. Pathologic Staging: pT3, pN0. Additional Pathologic Findings: No significant findings. Ancillary Studies: N/A. Comment(s): There is a primary 4.5 cm moderately differentiated pancreatic adenocarcinoma present in the tail of the pancreas. There is direct extension of this tumor to the submucosa of the transverse colon (specimen #1). In addition, adenocarcinoma extends to and involves the renal capsule and splenic capsule. Despite four lymph nodes being negative for parenchymal metastases, one of the four lymph nodes does show perinodal deposits of adenocarcinoma. The adenocarcinoma present in the submucosa of the transverse colon, renal capsule, splenic capsule, and perinodal soft tissue is interpreted to represent direct extension of the primary tumor, rather than metastatic sites and thus, the tumor is staged as above.  Comment There are malignant cells with prominent nucleili and intracytoplasmic vacuoles, arranged in acinar pattern. The overall morphologic findings favor a diagnosis of well differentiated adenocarcinoma.  RADIOGRAPHIC STUDIES: I have personally reviewed the radiological images as listed and agreed with the findings in the report.  Ct Abdomen Pelvis Wo Contrast 04/29/2014    IMPRESSION: 1. Postoperative changes of distal pancreatectomy with stable pseudocyst or seroma adjacent to the surgical margin. 2. Resolved left upper quadrant abscess with 2 percutaneous drains in place. 3. Small pelvic free fluid. 4. Moderate left pleural effusion with likely compressive atelectasis in the left lower lobe. Difficult to definitively exclude pneumonia.   Electronically Signed   By: Lorin Picket M.D.   On: 04/29/2014 15:25    ASSESSMENT  & PLAN:  69 year old Caucasian female, with past medical history of recurrent bilateral lower extremity DVTs, presented to [facility] extensive left lower extremity DVT and thrombus in the IVC and the right, iliac vein, and chronic DVT in the right popliteal vein. She was also found to have a pancreatic tail mass on the CT scan.  1. Pancreatic adenocarcinoma, pT3 N0 M0, stage IIA -I reviewed her surgical pathology result with her and her daughter.  her surgical margin was negative.  -I discussed the role of adjuvant chemotherapy after her surgery to reduce the risk of cancer recurrence after surgery. We usually recommend single agent gemcitabine as adjuvant therapy, however she had multiple complications after surgery, likely need long time to recover. -- return to clinic in 3 weeks and reevaluate her candidacy for adjuvant chemotherapy.   2. Recurrent DVT  -I reviewed her lab results of prothrombin gene mutation, factor V Leyden mutation, and antithrombin III, which were all negative. -Giving her recurrent unprovoked DVT, I recommend anticoagulation indefinitely. -She is currently on  Lovenox 1 mg/kg every 12 hours however her kidney function is compromised, her GFR is around 40. I recommend her change Lovenox to 120mg  every 24 hrs. I given her prescription and spoke with her nursing home.   3. She will continue to follow-up with her primary care physician and neurologist Dr. Carles Collet for her tremor, peripheral neuropathy and gait disturbance.   Plan: -Return to clinic in 3 weeks for follow-up and reevaluate her candidacy for adjuvant chemotherapy   All questions were answered. The patient knows to call the clinic with any problems, questions or concerns. I spent 20 minutes counseling the patient face to face. The total time spent in the appointment was 25 minutes and more than 50% was on counseling.     Truitt Merle, MD 05/06/2014 5:49 PM

## 2014-05-07 ENCOUNTER — Telehealth: Payer: Self-pay

## 2014-05-07 LAB — CANCER ANTIGEN 19-9: CA 19-9: 1.2 U/mL (ref <35.0)

## 2014-05-07 NOTE — Telephone Encounter (Signed)
Returning [place] phone call. This is from [facility].  The lovenox was switched to 120mg  daily with the Rx for #30. The question is: is this just for 30 days and if so what is the plan after 30 days, or is this a long term change. The pt states it is just for 30 days. The OV note was not completely clear on this.

## 2014-05-07 NOTE — Telephone Encounter (Signed)
I am seeing her back in 3 weeks, and will decide the long term plan then. Please let the SNF know.  Thanks.  Krista Blue

## 2014-05-08 ENCOUNTER — Emergency Department (HOSPITAL_COMMUNITY)
Admission: EM | Admit: 2014-05-08 | Discharge: 2014-05-08 | Disposition: A | Discharge status: Home / Self Care | Payer: Medicare Other | Source: Home / Self Care | Attending: Emergency Medicine | Admitting: Emergency Medicine

## 2014-05-08 ENCOUNTER — Encounter (HOSPITAL_COMMUNITY): Payer: Self-pay | Admitting: Emergency Medicine

## 2014-05-08 ENCOUNTER — Emergency Department (HOSPITAL_COMMUNITY): Payer: Medicare Other

## 2014-05-08 ENCOUNTER — Encounter: Payer: Self-pay | Admitting: Hematology

## 2014-05-08 DIAGNOSIS — Z9049 Acquired absence of other specified parts of digestive tract: Secondary | ICD-10-CM | POA: Diagnosis present

## 2014-05-08 DIAGNOSIS — E876 Hypokalemia: Secondary | ICD-10-CM | POA: Diagnosis present

## 2014-05-08 DIAGNOSIS — N179 Acute kidney failure, unspecified: Secondary | ICD-10-CM | POA: Diagnosis present

## 2014-05-08 DIAGNOSIS — G250 Essential tremor: Secondary | ICD-10-CM | POA: Diagnosis present

## 2014-05-08 DIAGNOSIS — Z79899 Other long term (current) drug therapy: Secondary | ICD-10-CM

## 2014-05-08 DIAGNOSIS — Z808 Family history of malignant neoplasm of other organs or systems: Secondary | ICD-10-CM

## 2014-05-08 DIAGNOSIS — Z86711 Personal history of pulmonary embolism: Secondary | ICD-10-CM | POA: Insufficient documentation

## 2014-05-08 DIAGNOSIS — K3184 Gastroparesis: Secondary | ICD-10-CM | POA: Diagnosis present

## 2014-05-08 DIAGNOSIS — H409 Unspecified glaucoma: Secondary | ICD-10-CM | POA: Diagnosis present

## 2014-05-08 DIAGNOSIS — I959 Hypotension, unspecified: Secondary | ICD-10-CM | POA: Diagnosis present

## 2014-05-08 DIAGNOSIS — D630 Anemia in neoplastic disease: Secondary | ICD-10-CM | POA: Diagnosis present

## 2014-05-08 DIAGNOSIS — R079 Chest pain, unspecified: Secondary | ICD-10-CM | POA: Diagnosis not present

## 2014-05-08 DIAGNOSIS — R1012 Left upper quadrant pain: Secondary | ICD-10-CM | POA: Diagnosis present

## 2014-05-08 DIAGNOSIS — R109 Unspecified abdominal pain: Secondary | ICD-10-CM

## 2014-05-08 DIAGNOSIS — B965 Pseudomonas (aeruginosa) (mallei) (pseudomallei) as the cause of diseases classified elsewhere: Secondary | ICD-10-CM | POA: Diagnosis present

## 2014-05-08 DIAGNOSIS — G629 Polyneuropathy, unspecified: Secondary | ICD-10-CM | POA: Diagnosis present

## 2014-05-08 DIAGNOSIS — Z792 Long term (current) use of antibiotics: Secondary | ICD-10-CM | POA: Insufficient documentation

## 2014-05-08 DIAGNOSIS — Z8507 Personal history of malignant neoplasm of pancreas: Secondary | ICD-10-CM

## 2014-05-08 DIAGNOSIS — E871 Hypo-osmolality and hyponatremia: Secondary | ICD-10-CM | POA: Diagnosis present

## 2014-05-08 DIAGNOSIS — Z9889 Other specified postprocedural states: Secondary | ICD-10-CM

## 2014-05-08 DIAGNOSIS — M7989 Other specified soft tissue disorders: Secondary | ICD-10-CM | POA: Diagnosis present

## 2014-05-08 DIAGNOSIS — R532 Functional quadriplegia: Secondary | ICD-10-CM | POA: Diagnosis present

## 2014-05-08 DIAGNOSIS — Z86010 Personal history of colonic polyps: Secondary | ICD-10-CM | POA: Insufficient documentation

## 2014-05-08 DIAGNOSIS — E780 Pure hypercholesterolemia: Secondary | ICD-10-CM | POA: Diagnosis present

## 2014-05-08 DIAGNOSIS — Z86718 Personal history of other venous thrombosis and embolism: Secondary | ICD-10-CM | POA: Insufficient documentation

## 2014-05-08 DIAGNOSIS — E785 Hyperlipidemia, unspecified: Secondary | ICD-10-CM | POA: Diagnosis present

## 2014-05-08 DIAGNOSIS — Z90710 Acquired absence of both cervix and uterus: Secondary | ICD-10-CM

## 2014-05-08 DIAGNOSIS — Z6827 Body mass index (BMI) 27.0-27.9, adult: Secondary | ICD-10-CM

## 2014-05-08 DIAGNOSIS — Z9089 Acquired absence of other organs: Secondary | ICD-10-CM | POA: Insufficient documentation

## 2014-05-08 DIAGNOSIS — J9811 Atelectasis: Secondary | ICD-10-CM | POA: Diagnosis present

## 2014-05-08 DIAGNOSIS — K219 Gastro-esophageal reflux disease without esophagitis: Secondary | ICD-10-CM | POA: Diagnosis present

## 2014-05-08 DIAGNOSIS — Z833 Family history of diabetes mellitus: Secondary | ICD-10-CM

## 2014-05-08 DIAGNOSIS — G8918 Other acute postprocedural pain: Secondary | ICD-10-CM

## 2014-05-08 DIAGNOSIS — Z720 Tobacco use: Secondary | ICD-10-CM

## 2014-05-08 DIAGNOSIS — Z79891 Long term (current) use of opiate analgesic: Secondary | ICD-10-CM

## 2014-05-08 DIAGNOSIS — I10 Essential (primary) hypertension: Secondary | ICD-10-CM | POA: Insufficient documentation

## 2014-05-08 DIAGNOSIS — M25512 Pain in left shoulder: Secondary | ICD-10-CM | POA: Diagnosis present

## 2014-05-08 DIAGNOSIS — Z862 Personal history of diseases of the blood and blood-forming organs and certain disorders involving the immune mechanism: Secondary | ICD-10-CM | POA: Insufficient documentation

## 2014-05-08 DIAGNOSIS — Z8739 Personal history of other diseases of the musculoskeletal system and connective tissue: Secondary | ICD-10-CM | POA: Insufficient documentation

## 2014-05-08 DIAGNOSIS — Z7901 Long term (current) use of anticoagulants: Secondary | ICD-10-CM

## 2014-05-08 DIAGNOSIS — R339 Retention of urine, unspecified: Secondary | ICD-10-CM | POA: Diagnosis present

## 2014-05-08 DIAGNOSIS — C252 Malignant neoplasm of tail of pancreas: Secondary | ICD-10-CM | POA: Diagnosis present

## 2014-05-08 DIAGNOSIS — R000 Tachycardia, unspecified: Secondary | ICD-10-CM | POA: Diagnosis present

## 2014-05-08 DIAGNOSIS — A419 Sepsis, unspecified organism: Secondary | ICD-10-CM | POA: Diagnosis present

## 2014-05-08 DIAGNOSIS — Z6828 Body mass index (BMI) 28.0-28.9, adult: Secondary | ICD-10-CM

## 2014-05-08 DIAGNOSIS — Z87440 Personal history of urinary (tract) infections: Secondary | ICD-10-CM

## 2014-05-08 DIAGNOSIS — E440 Moderate protein-calorie malnutrition: Secondary | ICD-10-CM | POA: Diagnosis present

## 2014-05-08 DIAGNOSIS — F17210 Nicotine dependence, cigarettes, uncomplicated: Secondary | ICD-10-CM | POA: Diagnosis present

## 2014-05-08 DIAGNOSIS — F419 Anxiety disorder, unspecified: Secondary | ICD-10-CM | POA: Diagnosis present

## 2014-05-08 DIAGNOSIS — M1990 Unspecified osteoarthritis, unspecified site: Secondary | ICD-10-CM | POA: Diagnosis present

## 2014-05-08 DIAGNOSIS — Z882 Allergy status to sulfonamides status: Secondary | ICD-10-CM

## 2014-05-08 DIAGNOSIS — Z90411 Acquired partial absence of pancreas: Secondary | ICD-10-CM | POA: Diagnosis present

## 2014-05-08 DIAGNOSIS — J90 Pleural effusion, not elsewhere classified: Secondary | ICD-10-CM | POA: Diagnosis not present

## 2014-05-08 DIAGNOSIS — I081 Rheumatic disorders of both mitral and tricuspid valves: Secondary | ICD-10-CM | POA: Diagnosis present

## 2014-05-08 DIAGNOSIS — Z8249 Family history of ischemic heart disease and other diseases of the circulatory system: Secondary | ICD-10-CM

## 2014-05-08 DIAGNOSIS — K651 Peritoneal abscess: Secondary | ICD-10-CM | POA: Diagnosis present

## 2014-05-08 DIAGNOSIS — L0291 Cutaneous abscess, unspecified: Secondary | ICD-10-CM | POA: Diagnosis present

## 2014-05-08 DIAGNOSIS — T814XXA Infection following a procedure, initial encounter: Secondary | ICD-10-CM | POA: Diagnosis present

## 2014-05-08 DIAGNOSIS — Z803 Family history of malignant neoplasm of breast: Secondary | ICD-10-CM

## 2014-05-08 MED ORDER — HYDROMORPHONE HCL 1 MG/ML IJ SOLN
1.0000 mg | Freq: Once | INTRAMUSCULAR | Status: AC
  Administered 2014-05-08: 1 mg via INTRAMUSCULAR
  Filled 2014-05-08: qty 1

## 2014-05-08 MED ORDER — ONDANSETRON 4 MG PO TBDP
4.0000 mg | ORAL_TABLET | Freq: Once | ORAL | Status: AC
  Administered 2014-05-08: 4 mg via ORAL
  Filled 2014-05-08: qty 1

## 2014-05-08 NOTE — ED Notes (Signed)
Bed: WA07 Expected date: 05/08/14 Expected time: 5:29 PM Means of arrival: Ambulance Comments: Left abd tenderness, "pancreatic drain"

## 2014-05-08 NOTE — ED Notes (Signed)
Pt from  [facility] via [place] c/o Upper and lower left sided abdominal pain. Drains in place from surgery on 3/10. She reports she has surgery on tail of pancreas,splenectomy,poart of colon, and top of kidney. She reports pain around drain tube insertion. Pt reports after her bath she started having pain from tube pulling. Pt alert and oriented. No swelling, edema, or drainage around drainage tube insertion.

## 2014-05-08 NOTE — ED Notes (Signed)
Awake. Verbally responsive. A/O x4. Resp even and unlabored. No audible adventitious breath sounds noted. ABC's intact.  

## 2014-05-08 NOTE — ED Provider Notes (Signed)
CSN: [id number]     Arrival date & time 05/08/14  1730 History   First MD Initiated Contact with Patient 05/08/14 1734     Chief Complaint  Patient presents with  . Abdominal Pain     (Consider location/radiation/quality/duration/timing/severity/associated sxs/prior Treatment) HPI Comments: Patient here with pain at her surgical drain sites that happened after her dressing was changed today. Pain characterized as sharp and worse with movement. No associated fever, vomiting, diarrhea. Denies any drainage at the drain tubes and notes that the drain bulbs have been feeling appropriately. Was given pain medication prior to arrival and does so much better at this time. No other complaints of  Patient is a 69 y.o. female presenting with abdominal pain. The history is provided by the patient.  Abdominal Pain   Past Medical History  Diagnosis Date  . Glaucoma   . Clotting disorder   . DVT (deep venous thrombosis)     LEFT  . GERD (gastroesophageal reflux disease)   . Hypercholesterolemia   . Peripheral neuropathy   . Essential tremor   . Diverticulosis   . Arthritis     knees and back  . Cancer     Pancreatic  . Hypertension   . Colon polyps   . UTI (lower urinary tract infection)   . Complication of anesthesia     slow to wak up   . Pulmonary embolism    Past Surgical History  Procedure Laterality Date  . Cholecystectomy    . Abdominal hysterectomy    . Knee surgery Bilateral   . Greenfield filter    . Back surgery    . Eus N/A 02/26/2014    Procedure: UPPER ENDOSCOPIC ULTRASOUND (EUS) LINEAR;  Surgeon: Beryle Beams, MD;  Location: [facility];  Service: Endoscopy;  Laterality: N/A;  . Fine needle aspiration N/A 02/26/2014    Procedure: FINE NEEDLE ASPIRATION (FNA) LINEAR;  Surgeon: Beryle Beams, MD;  Location: [facility];  Service: Endoscopy;  Laterality: N/A;  . Breast lumpectomy Right   . Spine surgery    . Xi robotic assisted laparoscopic distal pancreatectomy N/A  04/08/2014    Procedure: XI ROBOTIC ASSISTED CONVERTED LAPAROSCOPIC HAND ASSITED DISTAL PANCREATECTOMY/SPLENECTOMY WITH PARTIAL COLECTOMY;  Surgeon: [name], MD;  Location: [facility];  Service: General;  Laterality: N/A;   Family History  Problem Relation Age of Onset  . Diabetes Father   . Cancer Father 31    prostate  . Hyperlipidemia Father   . Cataracts Maternal Grandmother   . Hypertension Mother   . Arthritis Mother   . Heart disease Mother   . Glaucoma Sister   . Cancer Sister 71    Breast  . Cancer Paternal Uncle     throat +cig  . Diabetes Paternal Grandmother    History  Substance Use Topics  . Smoking status: Current Every Day Smoker -- 0.50 packs/day for 45 years    Types: Cigarettes  . Smokeless tobacco: Never Used  . Alcohol Use: 0.0 oz/week    0 Standard drinks or equivalent per week     Comment: occ- glass of brandy or wine socially    OB History    No data available     Review of Systems  Gastrointestinal: Positive for abdominal pain.  All other systems reviewed and are negative.     Allergies  Sulfa antibiotics  Home Medications   Prior to Admission medications   Medication Sig Start Date End Date Taking? Authorizing Provider  atorvastatin (  LIPITOR) 40 MG tablet Take 1 tablet (40 mg total) by mouth daily. 05/05/14   Gerlene Fee, NP  ciprofloxacin (CIPRO) 500 MG tablet Take 500 mg by mouth 2 (two) times daily.    Historical Provider, MD  diltiazem (CARTIA XT) 120 MG 24 hr capsule Take 1 capsule (120 mg total) by mouth daily. 05/05/14   Gerlene Fee, NP  enoxaparin (LOVENOX) 120 MG/0.8ML injection Inject 0.8 mLs (120 mg total) into the skin daily. 05/06/14   Truitt Merle, MD  gabapentin (NEURONTIN) 300 MG capsule TAKE ONE CAPSULE BY MOUTH AT BEDTIME; AFTER 7 DAYS, MAY INCREASE TO 2 CAPSULES AT BEDTIME 03/28/14   Mancel Bale, PA-C  latanoprost (XALATAN) 0.005 % ophthalmic solution Place 1 drop into both eyes at bedtime.    Historical Provider, MD    loperamide (IMODIUM) 2 MG capsule Take 1 capsule (2 mg total) by mouth 3 (three) times daily before meals. 05/04/14   [name], MD  pantoprazole (PROTONIX) 40 MG tablet TAKE 1 TABLET BY MOUTH DAILY 03/28/14   Leandrew Koyanagi, MD  primidone (MYSOLINE) 50 MG tablet Take 50 mg by mouth daily. Patient takes at [employer], MD  propranolol ER (INDERAL LA) 120 MG 24 hr capsule TAKE ONE CAPSULE BY MOUTH DAILY Patient taking differently: TAKE ONE CAPSULE BY MOUTH DAILY AT BEDTIME 03/28/14   Mancel Bale, PA-C  sucralfate (CARAFATE) 1 GM/10ML suspension Take 10 mLs (1 g total) by mouth 4 (four) times daily -  with meals and at bedtime. 05/04/14   [name], MD  tamsulosin (FLOMAX) 0.4 MG CAPS capsule Take 1 capsule (0.4 mg total) by mouth daily. 05/04/14   [name], MD  traMADol (ULTRAM) 50 MG tablet Take 1 tablet (50 mg total) by mouth every 6 (six) hours as needed. 05/04/14   [name], MD   BP 153/81 mmHg  Pulse 78  Temp(Src) 98.4 F (36.9 C) (Oral)  Resp 18  SpO2 97% Physical Exam  Constitutional: She is oriented to person, place, and time. She appears well-developed and well-nourished.  Non-toxic appearance. No distress.  HENT:  Head: Normocephalic and atraumatic.  Eyes: Conjunctivae, EOM and lids are normal. Pupils are equal, round, and reactive to light.  Neck: Normal range of motion. Neck supple. No tracheal deviation present. No thyroid mass present.  Cardiovascular: Normal rate, regular rhythm and normal heart sounds.  Exam reveals no gallop.   No murmur heard. Pulmonary/Chest: Effort normal and breath sounds normal. No stridor. No respiratory distress. She has no decreased breath sounds. She has no wheezes. She has no rhonchi. She has no rales.  Abdominal: Soft. Normal appearance and bowel sounds are normal. She exhibits no distension. There is no tenderness. There is no rebound and no CVA tenderness.    Musculoskeletal: Normal range of motion. She exhibits no  edema or tenderness.  Neurological: She is alert and oriented to person, place, and time. She has normal strength. No cranial nerve deficit or sensory deficit. GCS eye subscore is 4. GCS verbal subscore is 5. GCS motor subscore is 6.  Skin: Skin is warm and dry. No abrasion and no rash noted.  Psychiatric: She has a normal mood and affect. Her speech is normal and behavior is normal.  Nursing note and vitals reviewed.   ED Course  Procedures (including critical care time) Labs Review Labs Reviewed - No data to display  Imaging Review No results found.   EKG Interpretation None  MDM   Final diagnoses:  Abdominal pain    No evidence of infection at the patient's surgical drainage tubes. Abdominal films show that they're in good position.    Lacretia Leigh, MD 05/08/14 [phone]

## 2014-05-08 NOTE — ED Notes (Signed)
MD at bedside. 

## 2014-05-08 NOTE — ED Notes (Signed)
Attempted to call [facility] to make aware of pt returning back to facility but did not get an answer

## 2014-05-08 NOTE — ED Notes (Signed)
Patient transported to X-ray 

## 2014-05-08 NOTE — Discharge Instructions (Signed)
Followup with your Dr. as needed °

## 2014-05-08 NOTE — ED Notes (Signed)
Pt awake. Verbally responsive. Resp even and unlabored. ABC's intact. Milked and flushed both JP drains with NS flush. Pt tolerated well. Empty dk serous drainage approximately 5ml from drain #2 and 55ml of seosanguineous drainage from #1. Pt had episode of N/V x1. Will make Dr. Zenia Resides aware.

## 2014-05-09 ENCOUNTER — Other Ambulatory Visit: Payer: Self-pay

## 2014-05-09 ENCOUNTER — Inpatient Hospital Stay (HOSPITAL_COMMUNITY)
Admission: EM | Admit: 2014-05-09 | Discharge: 2014-05-21 | DRG: 186 | Disposition: A | Discharge status: Skilled Nursing Facility | Payer: Medicare Other | Attending: Internal Medicine | Admitting: Internal Medicine

## 2014-05-09 ENCOUNTER — Emergency Department (HOSPITAL_COMMUNITY): Payer: Medicare Other

## 2014-05-09 ENCOUNTER — Inpatient Hospital Stay (HOSPITAL_COMMUNITY): Payer: Medicare Other

## 2014-05-09 ENCOUNTER — Encounter (HOSPITAL_COMMUNITY): Payer: Self-pay | Admitting: Radiology

## 2014-05-09 DIAGNOSIS — G629 Polyneuropathy, unspecified: Secondary | ICD-10-CM | POA: Diagnosis present

## 2014-05-09 DIAGNOSIS — R079 Chest pain, unspecified: Secondary | ICD-10-CM

## 2014-05-09 DIAGNOSIS — K651 Peritoneal abscess: Secondary | ICD-10-CM | POA: Diagnosis present

## 2014-05-09 DIAGNOSIS — L0291 Cutaneous abscess, unspecified: Secondary | ICD-10-CM

## 2014-05-09 DIAGNOSIS — I959 Hypotension, unspecified: Secondary | ICD-10-CM | POA: Diagnosis present

## 2014-05-09 DIAGNOSIS — E785 Hyperlipidemia, unspecified: Secondary | ICD-10-CM | POA: Diagnosis present

## 2014-05-09 DIAGNOSIS — J9811 Atelectasis: Secondary | ICD-10-CM | POA: Diagnosis present

## 2014-05-09 DIAGNOSIS — T814XXA Infection following a procedure, initial encounter: Secondary | ICD-10-CM | POA: Diagnosis present

## 2014-05-09 DIAGNOSIS — I081 Rheumatic disorders of both mitral and tricuspid valves: Secondary | ICD-10-CM | POA: Diagnosis present

## 2014-05-09 DIAGNOSIS — Z792 Long term (current) use of antibiotics: Secondary | ICD-10-CM | POA: Diagnosis not present

## 2014-05-09 DIAGNOSIS — H409 Unspecified glaucoma: Secondary | ICD-10-CM | POA: Diagnosis present

## 2014-05-09 DIAGNOSIS — Z6827 Body mass index (BMI) 27.0-27.9, adult: Secondary | ICD-10-CM | POA: Diagnosis not present

## 2014-05-09 DIAGNOSIS — Z7901 Long term (current) use of anticoagulants: Secondary | ICD-10-CM | POA: Diagnosis not present

## 2014-05-09 DIAGNOSIS — C252 Malignant neoplasm of tail of pancreas: Secondary | ICD-10-CM | POA: Diagnosis present

## 2014-05-09 DIAGNOSIS — D630 Anemia in neoplastic disease: Secondary | ICD-10-CM | POA: Diagnosis present

## 2014-05-09 DIAGNOSIS — R002 Palpitations: Secondary | ICD-10-CM | POA: Diagnosis not present

## 2014-05-09 DIAGNOSIS — Z9049 Acquired absence of other specified parts of digestive tract: Secondary | ICD-10-CM | POA: Diagnosis present

## 2014-05-09 DIAGNOSIS — Z86010 Personal history of colonic polyps: Secondary | ICD-10-CM | POA: Diagnosis not present

## 2014-05-09 DIAGNOSIS — M7989 Other specified soft tissue disorders: Secondary | ICD-10-CM | POA: Diagnosis present

## 2014-05-09 DIAGNOSIS — M1990 Unspecified osteoarthritis, unspecified site: Secondary | ICD-10-CM | POA: Diagnosis present

## 2014-05-09 DIAGNOSIS — E876 Hypokalemia: Secondary | ICD-10-CM | POA: Diagnosis present

## 2014-05-09 DIAGNOSIS — Z6828 Body mass index (BMI) 28.0-28.9, adult: Secondary | ICD-10-CM | POA: Diagnosis not present

## 2014-05-09 DIAGNOSIS — Z86711 Personal history of pulmonary embolism: Secondary | ICD-10-CM | POA: Diagnosis not present

## 2014-05-09 DIAGNOSIS — Z882 Allergy status to sulfonamides status: Secondary | ICD-10-CM | POA: Diagnosis not present

## 2014-05-09 DIAGNOSIS — B965 Pseudomonas (aeruginosa) (mallei) (pseudomallei) as the cause of diseases classified elsewhere: Secondary | ICD-10-CM | POA: Diagnosis present

## 2014-05-09 DIAGNOSIS — R000 Tachycardia, unspecified: Secondary | ICD-10-CM | POA: Diagnosis not present

## 2014-05-09 DIAGNOSIS — R1012 Left upper quadrant pain: Secondary | ICD-10-CM | POA: Diagnosis present

## 2014-05-09 DIAGNOSIS — J90 Pleural effusion, not elsewhere classified: Principal | ICD-10-CM

## 2014-05-09 DIAGNOSIS — D638 Anemia in other chronic diseases classified elsewhere: Secondary | ICD-10-CM

## 2014-05-09 DIAGNOSIS — Z9889 Other specified postprocedural states: Secondary | ICD-10-CM

## 2014-05-09 DIAGNOSIS — Z833 Family history of diabetes mellitus: Secondary | ICD-10-CM | POA: Diagnosis not present

## 2014-05-09 DIAGNOSIS — I82402 Acute embolism and thrombosis of unspecified deep veins of left lower extremity: Secondary | ICD-10-CM | POA: Diagnosis not present

## 2014-05-09 DIAGNOSIS — I82409 Acute embolism and thrombosis of unspecified deep veins of unspecified lower extremity: Secondary | ICD-10-CM | POA: Diagnosis not present

## 2014-05-09 DIAGNOSIS — R532 Functional quadriplegia: Secondary | ICD-10-CM | POA: Diagnosis present

## 2014-05-09 DIAGNOSIS — Z79891 Long term (current) use of opiate analgesic: Secondary | ICD-10-CM | POA: Diagnosis not present

## 2014-05-09 DIAGNOSIS — M25512 Pain in left shoulder: Secondary | ICD-10-CM | POA: Diagnosis present

## 2014-05-09 DIAGNOSIS — Z8249 Family history of ischemic heart disease and other diseases of the circulatory system: Secondary | ICD-10-CM | POA: Diagnosis not present

## 2014-05-09 DIAGNOSIS — E440 Moderate protein-calorie malnutrition: Secondary | ICD-10-CM | POA: Diagnosis present

## 2014-05-09 DIAGNOSIS — Z803 Family history of malignant neoplasm of breast: Secondary | ICD-10-CM | POA: Diagnosis not present

## 2014-05-09 DIAGNOSIS — K3184 Gastroparesis: Secondary | ICD-10-CM | POA: Diagnosis present

## 2014-05-09 DIAGNOSIS — Z86718 Personal history of other venous thrombosis and embolism: Secondary | ICD-10-CM | POA: Diagnosis not present

## 2014-05-09 DIAGNOSIS — Z808 Family history of malignant neoplasm of other organs or systems: Secondary | ICD-10-CM | POA: Diagnosis not present

## 2014-05-09 DIAGNOSIS — E871 Hypo-osmolality and hyponatremia: Secondary | ICD-10-CM | POA: Diagnosis present

## 2014-05-09 DIAGNOSIS — C259 Malignant neoplasm of pancreas, unspecified: Secondary | ICD-10-CM

## 2014-05-09 DIAGNOSIS — K219 Gastro-esophageal reflux disease without esophagitis: Secondary | ICD-10-CM | POA: Diagnosis present

## 2014-05-09 DIAGNOSIS — I10 Essential (primary) hypertension: Secondary | ICD-10-CM | POA: Diagnosis not present

## 2014-05-09 DIAGNOSIS — F419 Anxiety disorder, unspecified: Secondary | ICD-10-CM | POA: Diagnosis present

## 2014-05-09 DIAGNOSIS — R071 Chest pain on breathing: Secondary | ICD-10-CM | POA: Diagnosis not present

## 2014-05-09 DIAGNOSIS — R339 Retention of urine, unspecified: Secondary | ICD-10-CM | POA: Diagnosis present

## 2014-05-09 DIAGNOSIS — J948 Other specified pleural conditions: Secondary | ICD-10-CM | POA: Diagnosis not present

## 2014-05-09 DIAGNOSIS — Z79899 Other long term (current) drug therapy: Secondary | ICD-10-CM | POA: Diagnosis not present

## 2014-05-09 DIAGNOSIS — Z87440 Personal history of urinary (tract) infections: Secondary | ICD-10-CM | POA: Diagnosis not present

## 2014-05-09 DIAGNOSIS — E780 Pure hypercholesterolemia: Secondary | ICD-10-CM | POA: Diagnosis present

## 2014-05-09 DIAGNOSIS — I471 Supraventricular tachycardia: Secondary | ICD-10-CM | POA: Diagnosis not present

## 2014-05-09 DIAGNOSIS — A419 Sepsis, unspecified organism: Secondary | ICD-10-CM | POA: Diagnosis present

## 2014-05-09 DIAGNOSIS — G250 Essential tremor: Secondary | ICD-10-CM | POA: Diagnosis present

## 2014-05-09 DIAGNOSIS — Z90411 Acquired partial absence of pancreas: Secondary | ICD-10-CM | POA: Diagnosis present

## 2014-05-09 DIAGNOSIS — F17210 Nicotine dependence, cigarettes, uncomplicated: Secondary | ICD-10-CM | POA: Diagnosis present

## 2014-05-09 DIAGNOSIS — N179 Acute kidney failure, unspecified: Secondary | ICD-10-CM | POA: Diagnosis present

## 2014-05-09 LAB — I-STAT TROPONIN, ED: TROPONIN I, POC: 0 ng/mL (ref 0.00–0.08)

## 2014-05-09 LAB — CBC
HCT: 34.2 % — ABNORMAL LOW (ref 36.0–46.0)
Hemoglobin: 10.3 g/dL — ABNORMAL LOW (ref 12.0–15.0)
MCH: 29.3 pg (ref 26.0–34.0)
MCHC: 30.1 g/dL (ref 30.0–36.0)
MCV: 97.2 fL (ref 78.0–100.0)
Platelets: 476 10*3/uL — ABNORMAL HIGH (ref 150–400)
RBC: 3.52 MIL/uL — ABNORMAL LOW (ref 3.87–5.11)
RDW: 16.8 % — AB (ref 11.5–15.5)
WBC: 12.5 10*3/uL — AB (ref 4.0–10.5)

## 2014-05-09 LAB — PROTIME-INR
INR: 1.08 (ref 0.00–1.49)
Prothrombin Time: 14.1 seconds (ref 11.6–15.2)

## 2014-05-09 LAB — HEPATIC FUNCTION PANEL
ALT: 17 U/L (ref 0–35)
AST: 38 U/L — ABNORMAL HIGH (ref 0–37)
Albumin: 3.8 g/dL (ref 3.5–5.2)
Alkaline Phosphatase: 78 U/L (ref 39–117)
BILIRUBIN INDIRECT: 0.4 mg/dL (ref 0.3–0.9)
BILIRUBIN TOTAL: 0.5 mg/dL (ref 0.3–1.2)
Bilirubin, Direct: 0.1 mg/dL (ref 0.0–0.5)
Total Protein: 7.6 g/dL (ref 6.0–8.3)

## 2014-05-09 LAB — I-STAT CHEM 8, ED
BUN: 6 mg/dL (ref 6–23)
CHLORIDE: 102 mmol/L (ref 96–112)
Calcium, Ion: 1.16 mmol/L (ref 1.13–1.30)
Creatinine, Ser: 1.1 mg/dL (ref 0.50–1.10)
GLUCOSE: 140 mg/dL — AB (ref 70–99)
HCT: 37 % (ref 36.0–46.0)
Hemoglobin: 12.6 g/dL (ref 12.0–15.0)
Potassium: 3.7 mmol/L (ref 3.5–5.1)
SODIUM: 141 mmol/L (ref 135–145)
TCO2: 22 mmol/L (ref 0–100)

## 2014-05-09 LAB — HEPARIN LEVEL (UNFRACTIONATED): HEPARIN UNFRACTIONATED: 0.7 [IU]/mL (ref 0.30–0.70)

## 2014-05-09 LAB — LIPASE, BLOOD: Lipase: 85 U/L — ABNORMAL HIGH (ref 11–59)

## 2014-05-09 LAB — MRSA PCR SCREENING: MRSA by PCR: NEGATIVE

## 2014-05-09 MED ORDER — PRIMIDONE 50 MG PO TABS
50.0000 mg | ORAL_TABLET | Freq: Every day | ORAL | Status: DC
  Administered 2014-05-09 – 2014-05-20 (×12): 50 mg via ORAL
  Filled 2014-05-09 (×16): qty 1

## 2014-05-09 MED ORDER — PANTOPRAZOLE SODIUM 40 MG PO TBEC: 40.0000 mg | DELAYED_RELEASE_TABLET | Freq: Every day | ORAL | Status: DC

## 2014-05-09 MED ORDER — SODIUM CHLORIDE 0.9 % IV SOLN
250.0000 mL | INTRAVENOUS | Status: DC | PRN
  Administered 2014-05-09: 250 mL via INTRAVENOUS

## 2014-05-09 MED ORDER — SUCRALFATE 1 GM/10ML PO SUSP
1.0000 g | Freq: Three times a day (TID) | ORAL | Status: DC
  Administered 2014-05-09 – 2014-05-21 (×47): 1 g via ORAL
  Filled 2014-05-09 (×50): qty 10

## 2014-05-09 MED ORDER — SODIUM CHLORIDE 0.9 % IJ SOLN
3.0000 mL | INTRAMUSCULAR | Status: DC | PRN
  Administered 2014-05-10: 3 mL via INTRAVENOUS
  Filled 2014-05-09: qty 3

## 2014-05-09 MED ORDER — PROMETHAZINE HCL 25 MG PO TABS
12.5000 mg | ORAL_TABLET | Freq: Four times a day (QID) | ORAL | Status: DC | PRN
  Administered 2014-05-12 – 2014-05-17 (×4): 12.5 mg via ORAL
  Filled 2014-05-09 (×5): qty 1

## 2014-05-09 MED ORDER — MORPHINE SULFATE 2 MG/ML IJ SOLN
2.0000 mg | INTRAMUSCULAR | Status: DC | PRN
  Administered 2014-05-09: 4 mg via INTRAVENOUS
  Administered 2014-05-09: 2 mg via INTRAVENOUS
  Administered 2014-05-09 – 2014-05-13 (×9): 4 mg via INTRAVENOUS
  Administered 2014-05-13: 2 mg via INTRAVENOUS
  Administered 2014-05-13: 4 mg via INTRAVENOUS
  Administered 2014-05-14 – 2014-05-15 (×4): 2 mg via INTRAVENOUS
  Filled 2014-05-09: qty 2
  Filled 2014-05-09: qty 1
  Filled 2014-05-09 (×3): qty 2
  Filled 2014-05-09: qty 1
  Filled 2014-05-09 (×3): qty 2
  Filled 2014-05-09: qty 1
  Filled 2014-05-09 (×2): qty 2
  Filled 2014-05-09: qty 1
  Filled 2014-05-09 (×4): qty 2
  Filled 2014-05-09: qty 1

## 2014-05-09 MED ORDER — MORPHINE SULFATE 2 MG/ML IJ SOLN
2.0000 mg | INTRAMUSCULAR | Status: DC | PRN
  Administered 2014-05-09: 2 mg via INTRAVENOUS
  Filled 2014-05-09: qty 1

## 2014-05-09 MED ORDER — MORPHINE SULFATE 4 MG/ML IJ SOLN
4.0000 mg | Freq: Once | INTRAMUSCULAR | Status: AC
  Administered 2014-05-09: 4 mg via INTRAVENOUS
  Filled 2014-05-09: qty 1

## 2014-05-09 MED ORDER — DILTIAZEM HCL ER COATED BEADS 120 MG PO CP24
120.0000 mg | ORAL_CAPSULE | Freq: Every day | ORAL | Status: DC
  Administered 2014-05-09: 120 mg via ORAL
  Filled 2014-05-09: qty 1

## 2014-05-09 MED ORDER — SODIUM CHLORIDE 0.9 % IJ SOLN
3.0000 mL | Freq: Two times a day (BID) | INTRAMUSCULAR | Status: DC
  Administered 2014-05-09 – 2014-05-21 (×22): 3 mL via INTRAVENOUS

## 2014-05-09 MED ORDER — HYDROCODONE-ACETAMINOPHEN 5-325 MG PO TABS
1.0000 | ORAL_TABLET | ORAL | Status: DC | PRN
  Administered 2014-05-09 – 2014-05-10 (×4): 2 via ORAL
  Administered 2014-05-10: 1 via ORAL
  Administered 2014-05-10 – 2014-05-11 (×2): 2 via ORAL
  Administered 2014-05-11: 1 via ORAL
  Administered 2014-05-11 – 2014-05-12 (×6): 2 via ORAL
  Filled 2014-05-09 (×4): qty 2
  Filled 2014-05-09: qty 1
  Filled 2014-05-09 (×4): qty 2
  Filled 2014-05-09: qty 1
  Filled 2014-05-09 (×4): qty 2

## 2014-05-09 MED ORDER — TAMSULOSIN HCL 0.4 MG PO CAPS
0.4000 mg | ORAL_CAPSULE | Freq: Every day | ORAL | Status: DC
  Administered 2014-05-09 – 2014-05-21 (×13): 0.4 mg via ORAL
  Filled 2014-05-09 (×15): qty 1

## 2014-05-09 MED ORDER — PANTOPRAZOLE SODIUM 40 MG PO TBEC
40.0000 mg | DELAYED_RELEASE_TABLET | Freq: Two times a day (BID) | ORAL | Status: DC
  Administered 2014-05-09 – 2014-05-21 (×23): 40 mg via ORAL
  Filled 2014-05-09 (×24): qty 1

## 2014-05-09 MED ORDER — IOHEXOL 350 MG/ML SOLN
100.0000 mL | Freq: Once | INTRAVENOUS | Status: AC | PRN
  Administered 2014-05-09: 100 mL via INTRAVENOUS

## 2014-05-09 MED ORDER — LATANOPROST 0.005 % OP SOLN
1.0000 [drp] | Freq: Every day | OPHTHALMIC | Status: DC
  Administered 2014-05-09 – 2014-05-20 (×12): 1 [drp] via OPHTHALMIC
  Filled 2014-05-09 (×2): qty 2.5

## 2014-05-09 MED ORDER — HEPARIN (PORCINE) IN NACL 100-0.45 UNIT/ML-% IJ SOLN
1200.0000 [IU]/h | INTRAMUSCULAR | Status: DC
  Administered 2014-05-09 – 2014-05-11 (×3): 1200 [IU]/h via INTRAVENOUS
  Filled 2014-05-09 (×6): qty 250

## 2014-05-09 NOTE — ED Notes (Signed)
Bed: ZY34 Expected date: 05/09/14 Expected time:  Means of arrival:  Comments: EMS 69 yo F chest pain / drains to chest // seen yesterday

## 2014-05-09 NOTE — ED Notes (Signed)
Patient transported to X-ray 

## 2014-05-09 NOTE — ED Notes (Addendum)
On March 10,2016, pt had surgery for pancreatic cancer. She c/o lt side chest and back pain onset at 0100 and progressing. Pt has 2 drains on the the left side.  Pt was given ASA 324 mg in route.   VS: 142/72 P 92 SpO2: 96% Pt arrived via EMS, from [facility] [place]. Daughter is at bedside.

## 2014-05-09 NOTE — Consult Note (Signed)
PULMONARY / CRITICAL CARE MEDICINE   Name: [name] MRN: [id number] DOB: [birth year]    ADMISSION DATE:  05/09/2014 CONSULTATION DATE:  05/09/14  REFERRING MD :  R[facility]   CHIEF COMPLAINT:  New L CP  INITIAL PRESENTATION: 14 yobf recovering from extensive surgery for pancreatic Ca with multiple drains in place for LUQ abscess post op but tapered opioids down to one daily abruptly worse with R Upper/ Lat CP > ER am 4/10 with worsening L lung aeration ? Effusion > admitted for further eval and PCCM service asked to see pma 4.10     STUDIES: CTa chest 4/10 >>    SIGNIFICANT EVENTS:     HISTORY OF PRESENT ILLNESS:    HPI: [name] is a 69 y.o. female with past medical history of pancreatic cancer status post distal pancreatectomy, splenectomy and partial colectomy on 04/08/2014. She has a history of DVT 10 years ago and was placed on Coumadin and had an IVC filter placed. She was found in the fall of 2015 to have an acute DVT in the left leg also a thrombus involving the IVC filter. Patient is on Lovenox per Dr. Burr Medico mentions in her note that she will need anticoagulation indefinitely. Other past medical history includes peripheral neuropathy, essential tremor, "fast heart rate" on Inderal, hyperlipidemia.  The patient presents with acute onset 1 am 4/10  Lant > lateral  left-sided pleuritic chest pain in the lower left chest radiating to her back. Currently is a 6 out of 10. She was here yesterday for pain in the abdomen which she states is different from this current pain. She has 2 drains present for left upper quadrant abdominal abscess after her recent surgery one in the left upper abdomen and one in the??? left lower chest but states that the drains were not causing her pain. She complains of shortness of breath as well. She is found to have a moderate to large pleural effusion. According to the report the effusion was smaller yesterday when she was in the ER. The ER  doctor has spoken with Dr. Melvyn Novas who recommended she be admitted for thoracentesis. I feel that she also needs to be admitted for pain control as she is requiring IV pain medicines. She has been vomiting for the past 24 hours and may have difficulty keeping oral pain medications down.   No obvious trauma or  assoc cough or   chest tightness, subjective wheeze overt sinus or hb symptoms. No unusual exp hx or h/o childhood pna/ asthma or knowledge of premature birth.  Sleeping ok at baseline without nocturnal  or early am exacerbation  of respiratory  c/o's or need for noct saba. Also denies any obvious fluctuation of symptoms with weather or environmental changes or other aggravating or alleviating factors except as outlined above    .       PAST MEDICAL HISTORY :   has a past medical history of Glaucoma; Clotting disorder; DVT (deep venous thrombosis); GERD (gastroesophageal reflux disease); Hypercholesterolemia; Peripheral neuropathy; Essential tremor; Diverticulosis; Arthritis; Cancer; Hypertension; Colon polyps; UTI (lower urinary tract infection); Complication of anesthesia; and Pulmonary embolism.  has past surgical history that includes Cholecystectomy; Abdominal hysterectomy; Knee surgery (Bilateral); Greenfield filter; Back surgery; EUS (N/A, 02/26/2014); Fine needle aspiration (N/A, 02/26/2014); Breast lumpectomy (Right); Spine surgery; and XI robotic assisted laparoscopic distal pancreatectomy (N/A, 04/08/2014). Prior to Admission medications   Medication Sig Start Date End Date Taking? Authorizing Provider  atorvastatin (LIPITOR) 40 MG tablet  Take 1 tablet (40 mg total) by mouth daily. 05/05/14  Yes Gerlene Fee, NP  diltiazem (CARTIA XT) 120 MG 24 hr capsule Take 1 capsule (120 mg total) by mouth daily. 05/05/14  Yes Gerlene Fee, NP  DOXYCYCLINE HYCLATE PO Take 100 mg by mouth 2 (two) times daily.   Yes Historical Provider, MD  enoxaparin (LOVENOX) 120 MG/0.8ML injection Inject 0.8 mLs  (120 mg total) into the skin daily. 05/06/14  Yes Truitt Merle, MD  latanoprost (XALATAN) 0.005 % ophthalmic solution Place 1 drop into both eyes at bedtime.   Yes Historical Provider, MD  pantoprazole (PROTONIX) 40 MG tablet TAKE 1 TABLET BY MOUTH DAILY 03/28/14  Yes Leandrew Koyanagi, MD  primidone (MYSOLINE) 50 MG tablet Take 50 mg by mouth at bedtime. Patient takes at nite   Yes Historical Provider, MD  propranolol ER (INDERAL LA) 120 MG 24 hr capsule TAKE ONE CAPSULE BY MOUTH DAILY 03/28/14  Yes Mancel Bale, PA-C  sucralfate (CARAFATE) 1 GM/10ML suspension Take 10 mLs (1 g total) by mouth 4 (four) times daily -  with meals and at bedtime. 05/04/14  Yes [name], MD  tamsulosin (FLOMAX) 0.4 MG CAPS capsule Take 1 capsule (0.4 mg total) by mouth daily. 05/04/14  Yes [name], MD  traMADol (ULTRAM) 50 MG tablet Take 1 tablet (50 mg total) by mouth every 6 (six) hours as needed. 05/04/14  Yes [name], MD  gabapentin (NEURONTIN) 300 MG capsule TAKE ONE CAPSULE BY MOUTH AT BEDTIME; AFTER 7 DAYS, MAY INCREASE TO 2 CAPSULES AT BEDTIME Patient not taking: Reported on 05/09/2014 03/28/14   Mancel Bale, PA-C  loperamide (IMODIUM) 2 MG capsule Take 1 capsule (2 mg total) by mouth 3 (three) times daily before meals. 05/04/14   [name], MD   Allergies  Allergen Reactions  . Sulfa Antibiotics Swelling    FAMILY HISTORY:  indicated that her mother is deceased. She indicated that her father is deceased. She indicated that her sister is alive. She indicated that her maternal grandmother is deceased. She indicated that her maternal grandfather is deceased. She indicated that her paternal grandmother is deceased. She indicated that her paternal grandfather is deceased. She indicated that her daughter is alive. She indicated that both of her sons are alive.  SOCIAL HISTORY:  reports that she has been smoking Cigarettes.  She has a 22.5 pack-year smoking history. She has never used smokeless tobacco. She  reports that she drinks alcohol. She reports that she does not use illicit drugs.  REVIEW OF SYSTEMS:     ROS  The following are not active complaints unless bolded sore throat, dysphagia, dental problems, itching, sneezing,  nasal congestion or excess/ purulent secretions, ear ache,   fever, chills, sweats, unintended wt loss,   exertional cp, hemoptysis,  orthopnea pnd or leg swelling, presyncope, palpitations, heartburn, abdominal pain, anorexia, nausea, vomiting, diarrhea  or change in bowel or urinary habits, change in stools or urine, dysuria,hematuria,  rash, arthralgias, visual complaints, headache, numbness weakness or ataxia or problems with walking or coordination,  change in mood/affect or memory.       SUBJECTIVE:  Uncomfortable lying flat, better sitting up due to pain > sob  VITAL SIGNS: Temp:  [98.4 F (36.9 C)-98.7 F (37.1 C)] 98.7 F (37.1 C) (04/10 1234) Pulse Rate:  [76-95] 95 (04/10 1234) Resp:  [13-29] 29 (04/10 1234) BP: (128-153)/(62-81) 150/73 mmHg (04/10 1234) SpO2:  [92 %-99 %] 99 % (04/10 1234) Weight:  [  195 lb 1.7 oz (88.5 kg)] 195 lb 1.7 oz (88.5 kg) (04/10 1234) HEMODYNAMICS:   VENTILATOR SETTINGS:   INTAKE / OUTPUT:  Intake/Output Summary (Last 24 hours) at 05/09/14 1332 Last data filed at 05/09/14 1149  Gross per 24 hour  Intake      0 ml  Output    340 ml  Net   -340 ml    PHYSICAL EXAMINATION: General: chronically > acutely ill, not toxic  Pt alert, approp nad   No jvd Oropharynx clear Neck supple Lungs with a few scattered exp > insp rhonchi bilaterally and decreaed bs L base RRR no s3 or or sign murmur Abd obese with poor excursion, multiple drains and diffuse mild tenderness s rebound  Ext wam with no edema or clubbing noted Neuro  No motor deficits   LABS:  CBC  Recent Labs Lab 05/03/14 0440 05/04/14 0415 05/09/14 0655 05/09/14 0721  WBC 9.0 8.3 12.5*  --   HGB 8.5* 8.1* 10.3* 12.6  HCT 27.2* 25.9* 34.2* 37.0  PLT  460* 476* 476*  --    Coag's  Recent Labs Lab 05/09/14 0655  INR 1.08   BMET  Recent Labs Lab 05/03/14 0440 05/04/14 0415 05/09/14 0721  NA 139 139 141  K 3.7 3.6 3.7  CL 106 105 102  CO2 24 26  --   BUN 22 19 6   CREATININE 1.56* 1.54* 1.10  GLUCOSE 98 95 140*   Electrolytes  Recent Labs Lab 05/03/14 0440 05/04/14 0415  CALCIUM 8.6 8.6  MG 1.8  --   PHOS 3.7 3.7   Sepsis Markers No results for input(s): LATICACIDVEN, PROCALCITON, O2SATVEN in the last 168 hours. ABG No results for input(s): PHART, PCO2ART, PO2ART in the last 168 hours. Liver Enzymes  Recent Labs Lab 05/03/14 0440 05/04/14 0415 05/09/14 0655  AST  --   --  38*  ALT  --   --  17  ALKPHOS  --   --  78  BILITOT  --   --  0.5  ALBUMIN 3.1* 3.2* 3.8   Cardiac Enzymes No results for input(s): TROPONINI, PROBNP in the last 168 hours. Glucose  Recent Labs Lab 05/02/14 1809  GLUCAP 103*    Imaging Dg Abd Acute W/chest  05/08/2014   CLINICAL DATA:  Left upper quadrant and left lower quadrant abdominal pain.  EXAM: DG ABDOMEN ACUTE W/ 1V CHEST  COMPARISON:  04/29/2014  FINDINGS: Two surgical drains are identified within the left upper quadrant of the abdomen. These are similar in position to 3/30 1/16. There is an IVC filter in place and there are surgical clips from prior cholecystectomy. There is no evidence of dilated bowel loops or free intraperitoneal air. No radiopaque calculi or other significant radiographic abnormality is seen. Heart size and mediastinal contours are within normal limits. Both lungs are clear. Moderate left pleural effusion is again noted.  IMPRESSION: 1. Nonobstructive bowel gas pattern. 2. Left pleural effusion 3. Similar position of left upper quadrant surgical drains.   Electronically Signed   By: Kerby Moors M.D.   On: 05/08/2014 18:27     ASSESSMENT / PLAN:  1)  Acute onset L cp new pattern from her LUQ pain post op assoc with poor aeration L base and ? Worse  effusion - main issue is pain control, not hypoxemia or wob, so no immediate need for thoracentesis or chest tube   ddx = pna/ PE, sympathetic effusion/ empyema related to recent LUQ abscess, atx.  Malignancy less likely but not excluded entirely. Hemothorax seems much less likely    Rec:  Full IV Hep and hold further lovenox for now  Hold abx unless spikes (pending sampling of the fluid)  CTa chest    Discussed with pt and daughter > the risk of tap blindly in this setting is >> benefit of early dx of whatever this is in L pleural space and can wait until tomorrow to do under u/s.   Christinia Gully, MD Pulmonary and [employer] [phone] After 5:30 PM or weekends, call [phone]

## 2014-05-09 NOTE — Progress Notes (Signed)
ANTICOAGULATION CONSULT NOTE - Follow Up Consult  Pharmacy Consult for Heparin (PTA Lovenox on hold for procedure) Indication: Hx DVT  Allergies  Allergen Reactions  . Sulfa Antibiotics Swelling   Patient Measurements: Height: 5' 9.5" (176.5 cm) Weight: 195 lb 1.7 oz (88.5 kg) IBW/kg (Calculated) : 67.35  Vital Signs: Temp: 98.5 F (36.9 C) (04/10 1600) Temp Source: Oral (04/10 1600) BP: 114/62 mmHg (04/10 1800) Pulse Rate: 105 (04/10 1800)  Labs:  Recent Labs  05/09/14 0655 05/09/14 0721 05/09/14 2056  HGB 10.3* 12.6  --   HCT 34.2* 37.0  --   PLT 476*  --   --   LABPROT 14.1  --   --   INR 1.08  --   --   HEPARINUNFRC  --   --  0.70  CREATININE  --  1.10  --     Estimated Creatinine Clearance: 59.4 mL/min (by C-G formula based on Cr of 1.1). Assessment: 69 y/o F on Lovenox PTA for Hx DVT. Heparin started in anticipation of thoracentesis. First HL is within therapeutic range.   Goal of Therapy:  Heparin level 0.3-0.7 units/ml Monitor platelets by anticoagulation protocol: Yes   Plan:  -Continue heparin at 1200 units/hr -Check HL with AM labs at 0500 to confirm -Daily CBC/HL -Monitor for bleeding, trend Hgb/Plts  Narda Bonds 05/09/2014,9:38 PM

## 2014-05-09 NOTE — Progress Notes (Signed)
ANTICOAGULATION CONSULT NOTE - Initial Consult  Pharmacy Consult for Heparin Indication: Hx of DVT, chronic Lovenox  Allergies  Allergen Reactions  . Sulfa Antibiotics Swelling   Patient Measurements: Height: 5' 9.5" (176.5 cm) Weight: 195 lb 1.7 oz (88.5 kg) IBW/kg (Calculated) : 67.35 Heparin Dosing Weight: 85kg  Vital Signs: Temp: 98.7 F (37.1 C) (04/10 1234) Temp Source: Oral (04/10 1234) BP: 150/73 mmHg (04/10 1343) Pulse Rate: 95 (04/10 1234)  Labs:  Recent Labs  05/09/14 0655 05/09/14 0721  HGB 10.3* 12.6  HCT 34.2* 37.0  PLT 476*  --   LABPROT 14.1  --   INR 1.08  --   CREATININE  --  1.10   Estimated Creatinine Clearance: 59.4 mL/min (by C-G formula based on Cr of 1.1).  Medical History: Past Medical History  Diagnosis Date  . Glaucoma   . Clotting disorder   . DVT (deep venous thrombosis)     LEFT  . GERD (gastroesophageal reflux disease)   . Hypercholesterolemia   . Peripheral neuropathy   . Essential tremor   . Diverticulosis   . Arthritis     knees and back  . Cancer     Pancreatic  . Hypertension   . Colon polyps   . UTI (lower urinary tract infection)   . Complication of anesthesia     slow to wak up   . Pulmonary embolism    Medications:  Scheduled:  . diltiazem  120 mg Oral Daily  . latanoprost  1 drop Both Eyes QHS  . pantoprazole  40 mg Oral BID AC  . primidone  50 mg Oral QHS  . sodium chloride  3 mL Intravenous Q12H  . sucralfate  1 g Oral TID WC & HS  . tamsulosin  0.4 mg Oral Daily   Infusions:    Assessment: 69 y.o. Female with  Pancreatic Ca s/p distal pancreatectomy, splenectomy and partial colectomy on 04/08/2014. She has a history of DVT 10 years ago and was placed on Coumadin and had an IVC filter placed, then fall of 2015 had an acute LLE DVT and a thrombus involving the IVC filter. Patient is on Lovenox per Dr. Burr Medico at 120mg  SQ daily. Previous admit 3/10-4/5, to ED from [place] 4/9 with pain related to drains,  returned to ED 4/10 with chest pain.  On Lovenox 120mg  SQ daily, last dose 4/9 in am  Begin IV Heparin, no load, plan thoracentesis tomorrow 4/11  Chest CT planned  Goal of Therapy:  Heparin level 0.3-0.7 units/ml Monitor platelets by anticoagulation protocol: Yes   Plan:   Heparin infusion at 1200 units/hr  Check 6 hr Heparin level  Daily CBC, Daily Hep level when levels therapeutic  Minda Ditto PharmD Pager [phone] 05/09/2014, 2:08 PM

## 2014-05-09 NOTE — ED Provider Notes (Addendum)
CSN: [id number]     Arrival date & time 05/09/14  2836 History   First MD Initiated Contact with Patient 05/09/14 [phone]     Chief Complaint  Patient presents with  . Chest Pain     (Consider location/radiation/quality/duration/timing/severity/associated sxs/prior Treatment) HPI Comments: Patient history of pancreatic cancer status post partial pancreatectomy/splenectomy with partial colectomy on March 10. She still has drains in place. She is currently in a nursing facility for rehabilitation. 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 She is on Lovenox. She presents with left-sided chest pain. She states the pain started about 1 AM during the night and is been sharp and constant in nature. It's worse with breathing and worse with movement. She denies any fevers. She denies any cough. She was here yesterday with abdominal pain but feels like a little bit better. She denies any nausea or vomiting. She has some baseline leg swelling but feels like her left leg is a little bit more swollen than normal.   Past Medical History  Diagnosis Date  . Glaucoma   . Clotting disorder   . DVT (deep venous thrombosis)     LEFT  . GERD (gastroesophageal reflux disease)   . Hypercholesterolemia   . Peripheral neuropathy   . Essential tremor   . Diverticulosis   . Arthritis     knees and back  . Cancer     Pancreatic  . Hypertension   . Colon polyps   . UTI (lower urinary tract infection)   . Complication of anesthesia     slow to wak up   . Pulmonary embolism    Past Surgical History  Procedure Laterality Date  . Cholecystectomy    . Abdominal hysterectomy    . Knee surgery Bilateral   . Greenfield filter    . Back surgery    . Eus N/A 02/26/2014    Procedure: UPPER ENDOSCOPIC ULTRASOUND (EUS) LINEAR;  Surgeon: Beryle Beams, MD;  Location: [facility];  Service: Endoscopy;  Laterality: N/A;  . Fine needle aspiration N/A 02/26/2014    Procedure: FINE NEEDLE ASPIRATION (FNA)  LINEAR;  Surgeon: Beryle Beams, MD;  Location: [facility];  Service: Endoscopy;  Laterality: N/A;  . Breast lumpectomy Right   . Spine surgery    . Xi robotic assisted laparoscopic distal pancreatectomy N/A 04/08/2014    Procedure: XI ROBOTIC ASSISTED CONVERTED LAPAROSCOPIC HAND ASSITED DISTAL PANCREATECTOMY/SPLENECTOMY WITH PARTIAL COLECTOMY;  Surgeon: [name], MD;  Location: [facility];  Service: General;  Laterality: N/A;   Family History  Problem Relation Age of Onset  . Diabetes Father   . Cancer Father 30    prostate  . Hyperlipidemia Father   . Cataracts Maternal Grandmother   . Hypertension Mother   . Arthritis Mother   . Heart disease Mother   . Glaucoma Sister   . Cancer Sister 68    Breast  . Cancer Paternal Uncle     throat +cig  . Diabetes Paternal Grandmother    History  Substance Use Topics  . Smoking status: Current Every Day Smoker -- 0.50 packs/day for 45 years    Types: Cigarettes  . Smokeless tobacco: Never Used  . Alcohol Use: 0.0 oz/week    0 Standard drinks or equivalent per week     Comment: occ- glass of brandy or wine socially    OB History    No data available     Review of Systems  Constitutional: Negative for  fever, chills, diaphoresis and fatigue.  HENT: Negative for congestion, rhinorrhea and sneezing.   Eyes: Negative.   Respiratory: Positive for shortness of breath. Negative for cough and chest tightness.   Cardiovascular: Positive for chest pain and leg swelling.  Gastrointestinal: Negative for nausea, vomiting, abdominal pain, diarrhea and blood in stool.  Genitourinary: Negative for frequency, hematuria, flank pain and difficulty urinating.  Musculoskeletal: Negative for back pain and arthralgias.  Skin: Negative for rash.  Neurological: Negative for dizziness, speech difficulty, weakness, numbness and headaches.      Allergies  Sulfa antibiotics  Home Medications   Prior to Admission medications   Medication Sig Start  Date End Date Taking? Authorizing Provider  atorvastatin (LIPITOR) 40 MG tablet Take 1 tablet (40 mg total) by mouth daily. 05/05/14  Yes Gerlene Fee, NP  diltiazem (CARTIA XT) 120 MG 24 hr capsule Take 1 capsule (120 mg total) by mouth daily. 05/05/14  Yes Gerlene Fee, NP  DOXYCYCLINE HYCLATE PO Take 100 mg by mouth 2 (two) times daily.   Yes Historical Provider, MD  enoxaparin (LOVENOX) 120 MG/0.8ML injection Inject 0.8 mLs (120 mg total) into the skin daily. 05/06/14  Yes Truitt Merle, MD  latanoprost (XALATAN) 0.005 % ophthalmic solution Place 1 drop into both eyes at bedtime.   Yes Historical Provider, MD  pantoprazole (PROTONIX) 40 MG tablet TAKE 1 TABLET BY MOUTH DAILY 03/28/14  Yes Leandrew Koyanagi, MD  primidone (MYSOLINE) 50 MG tablet Take 50 mg by mouth at bedtime. Patient takes at nite   Yes Historical Provider, MD  propranolol ER (INDERAL LA) 120 MG 24 hr capsule TAKE ONE CAPSULE BY MOUTH DAILY 03/28/14  Yes Mancel Bale, PA-C  sucralfate (CARAFATE) 1 GM/10ML suspension Take 10 mLs (1 g total) by mouth 4 (four) times daily -  with meals and at bedtime. 05/04/14  Yes [name], MD  tamsulosin (FLOMAX) 0.4 MG CAPS capsule Take 1 capsule (0.4 mg total) by mouth daily. 05/04/14  Yes [name], MD  traMADol (ULTRAM) 50 MG tablet Take 1 tablet (50 mg total) by mouth every 6 (six) hours as needed. 05/04/14  Yes [name], MD  gabapentin (NEURONTIN) 300 MG capsule TAKE ONE CAPSULE BY MOUTH AT BEDTIME; AFTER 7 DAYS, MAY INCREASE TO 2 CAPSULES AT BEDTIME Patient not taking: Reported on 05/09/2014 03/28/14   Mancel Bale, PA-C  loperamide (IMODIUM) 2 MG capsule Take 1 capsule (2 mg total) by mouth 3 (three) times daily before meals. 05/04/14   [name], MD   BP 133/63 mmHg  Pulse 85  Resp 15  SpO2 93% Physical Exam  Constitutional: She is oriented to person, place, and time. She appears well-developed and well-nourished.  HENT:  Head: Normocephalic and atraumatic.  Eyes: Pupils are  equal, round, and reactive to light.  Neck: Normal range of motion. Neck supple.  Cardiovascular: Normal rate, regular rhythm and normal heart sounds.   Pulmonary/Chest: Effort normal and breath sounds normal. No respiratory distress. She has no wheezes. She has no rales. She exhibits tenderness (positive tenderness to the left mid and lower chest wall).  Diminished breath sounds in the left side   Abdominal: Soft. Bowel sounds are normal. There is tenderness. There is no rebound and no guarding.  She has drains in place to her left upper abdomen. There some moderate tenderness around the area. There is no signs of infection.  Musculoskeletal: Normal range of motion. She exhibits edema.  Positive edema to the lower extremities  bilaterally. No calf tenderness  Lymphadenopathy:    She has no cervical adenopathy.  Neurological: She is alert and oriented to person, place, and time.  Skin: Skin is warm and dry. No rash noted.  Psychiatric: She has a normal mood and affect.    ED Course  Procedures (including critical care time) Labs Review Labs Reviewed  CBC - Abnormal; Notable for the following:    WBC 12.5 (*)    RBC 3.52 (*)    Hemoglobin 10.3 (*)    HCT 34.2 (*)    RDW 16.8 (*)    Platelets 476 (*)    All other components within normal limits  LIPASE, BLOOD - Abnormal; Notable for the following:    Lipase 85 (*)    All other components within normal limits  HEPATIC FUNCTION PANEL - Abnormal; Notable for the following:    AST 38 (*)    All other components within normal limits  I-STAT CHEM 8, ED - Abnormal; Notable for the following:    Glucose, Bld 140 (*)    All other components within normal limits  PROTIME-INR  Randolm Idol, ED    Imaging Review Dg Chest 2 View  05/09/2014   CLINICAL DATA:  69 year old female with shortness of breath and chest pain. Initial encounter.  Personal history of distal pancreatectomy and splenectomy.  EXAM: CHEST  2 VIEW  COMPARISON:  Chest  and abdominal series 4 /09/2014. CT Abdomen and Pelvis 04/29/2014, portable chest 04/17/2014.  FINDINGS: Semi upright AP and lateral views of the chest. Left pleural effusion has progressed and is moderate to large. Increased opacification at the left lung base. Lower lung volumes.  Stable cardiac size and mediastinal contours. No pneumothorax or pulmonary edema. Grossly clear right lung. Visualized tracheal air column is within normal limits.  Left abdomen percutaneous drains remain in place. Partially visible IVC filter.  IMPRESSION: 1. Progressed left pleural effusion, now moderate to large. Associated decreased ventilation in the left lung. 2. Lower lung volumes.   Electronically Signed   By: Genevie Ann M.D.   On: 05/09/2014 08:49   Dg Abd Acute W/chest  05/08/2014   CLINICAL DATA:  Left upper quadrant and left lower quadrant abdominal pain.  EXAM: DG ABDOMEN ACUTE W/ 1V CHEST  COMPARISON:  04/29/2014  FINDINGS: Two surgical drains are identified within the left upper quadrant of the abdomen. These are similar in position to 3/30 1/16. There is an IVC filter in place and there are surgical clips from prior cholecystectomy. There is no evidence of dilated bowel loops or free intraperitoneal air. No radiopaque calculi or other significant radiographic abnormality is seen. Heart size and mediastinal contours are within normal limits. Both lungs are clear. Moderate left pleural effusion is again noted.  IMPRESSION: 1. Nonobstructive bowel gas pattern. 2. Left pleural effusion 3. Similar position of left upper quadrant surgical drains.   Electronically Signed   By: Kerby Moors M.D.   On: 05/08/2014 18:27     EKG Interpretation None     ED ECG REPORT   Date: 05/09/2014  Rate: 90  Rhythm: normal sinus rhythm  QRS Axis: normal  Intervals: normal  ST/T Wave abnormalities: nonspecific ST/T changes  Conduction Disutrbances:none  Narrative Interpretation:   Old EKG Reviewed: unchanged, artifact  present  I have personally reviewed the EKG tracing and agree with the computerized printout as noted.   MDM   Final diagnoses:  Pleural effusion    PT s/p pancreatectomy/splenectomy/partial colectomy with JP drains  in place presents with chest pain and shortness of breath. Her sats are 92-93% on room air. She has pain with any breathing or movement. Her chest x-ray shows an increasing moderate to large left pleural effusion. She has an IVC filter in place and is on Lovenox so I would have a lower suspicion of a PE. Her symptoms do not sound cardiac in origin. I feel that her symptoms are related to the increasing pleural effusion.  9:45 I consulted the hospitalist on call, Dr. Wynelle Cleveland who requests that pulmonary be consulted prior to making the determination regarding admission.  10:53 spoke with Dr. Melvyn Novas who recommend admission with drainage of effusion, likely under u/s guidance after lovenox held.  Recommends admission to step-down.  Will advise hospitalist.  Malvin Johns, MD 05/09/14 [place], MD 05/09/14 [phone]

## 2014-05-09 NOTE — H&P (Addendum)
[facility] History and Physical  [name] LPF:[id number] DOB: 26-Jul-1945 DOA: 05/09/2014   PCP: Mauricio Po, FNP    Chief Complaint: chest pain  HPI: [name] is a 69 y.o. female with past medical history of pancreatic cancer status post distal pancreatectomy, splenectomy and partial colectomy on 04/08/2014. She has a history of DVT 10 years ago and was placed on Coumadin and had an IVC filter placed. She was found in the fall of 2015 to have an acute DVT in the left leg also a thrombus involving the IVC filter. Patient is on Lovenox per Dr. Burr Medico who mentions in her note that she will need anticoagulation indefinitely. Other past medical history includes peripheral neuropathy, essential tremor, "fast heart rate" on Inderal, hyperlipidemia.  The patient presents with left-sided chest pain in the lower left chest radiating to her back. Currently is a 6 out of 10. She was here yesterday for pain in the abdomen which she states is different from this current pain. She has 2 drains present for left upper quadrant abdominal abscess after her recent surgery one in the left upper abdomen and one in the left lower chest but states that the drains were not causing her pain. She complains of shortness of breath as well. She is found to have a moderate to large pleural effusion. According to the report the effusion was smaller yesterday when she was in the ER. The ER doctor has spoken with Dr. Melvyn Novas who recommended she be admitted for thoracentesis. I feel that she also needs to be admitted for pain control as she is requiring IV pain medicines. She has been vomiting for the past 24 hours and may have difficulty keeping oral pain medications down.    General: + anorexia, + weight loss 20 lb in about 2 months - no fever/ chills Cardiac: + chest pain left chest - no  Syncope,h/o fast heart heart- on Inderal for years, + pedal edema  Respiratory: Denies cough, shortness of breath,  wheezing GI: + abdominal pain around the drains, + nausea, vomiting,  No recent diarrhea and constipation- indigestion controlled on protonix GU: + urinary retention since surgery-was given multiple voiding trials- has had a foley and is due to see urology tomorrow Musculoskeletal: Denies arthritis  Skin: Denies suspicious skin lesions Neurologic: Denies focal weakness or numbness, change in vision Psychiatry: Denies depression or anxiety. Hematologic: +  bruising / bleeding due to Lovenox  Past Medical History  Diagnosis Date  . Glaucoma   . DVT (deep venous thrombosis)     LEFT  . GERD (gastroesophageal reflux disease)   . Hypercholesterolemia   . Peripheral neuropathy   . Essential tremor   . Diverticulosis   . Arthritis     knees and back  . Cancer     Pancreatic  . Hypertension   . Colon polyps   . Pulmonary embolism   " Accelerated HR"- placed on Propranolol  Past Surgical History  Procedure Laterality Date  . Cholecystectomy    . Abdominal hysterectomy    . Knee surgery Bilateral   . Greenfield filter    . Back surgery    . Eus N/A 02/26/2014    Procedure: UPPER ENDOSCOPIC ULTRASOUND (EUS) LINEAR;  Surgeon: Beryle Beams, MD;  Location: [facility];  Service: Endoscopy;  Laterality: N/A;  . Fine needle aspiration N/A 02/26/2014    Procedure: FINE NEEDLE ASPIRATION (FNA) LINEAR;  Surgeon: Beryle Beams, MD;  Location: [facility];  Service: Endoscopy;  Laterality: N/A;  . Breast lumpectomy Right   . Spine surgery    . Xi robotic assisted laparoscopic distal pancreatectomy N/A 04/08/2014    Procedure: XI ROBOTIC ASSISTED CONVERTED LAPAROSCOPIC HAND ASSITED DISTAL PANCREATECTOMY/SPLENECTOMY WITH PARTIAL COLECTOMY;  Surgeon: [name], MD;  Location: [facility];  Service: General;  Laterality: N/A;    Social History: smoker of cigarettes- 1/2 PPD stopped just prior to the abdominal surgery- smoked for about 40-45 yrs. Occasional alcohol use.  Lives at [facility].      Allergies  Allergen Reactions  . Sulfa Antibiotics Swelling    Family History  Problem Relation Age of Onset  . Diabetes Father   . Cancer Father 54    prostate  . Hyperlipidemia Father   . Cataracts Maternal Grandmother   . Hypertension Mother   . Arthritis Mother   . Heart disease Mother   . Glaucoma Sister   . Cancer Sister 69    Breast  . Cancer Paternal Uncle     throat +cig  . Diabetes Paternal Grandmother      Prior to Admission medications   Medication Sig Start Date End Date Taking? Authorizing Provider  atorvastatin (LIPITOR) 40 MG tablet Take 1 tablet (40 mg total) by mouth daily. 05/05/14  Yes Gerlene Fee, NP  diltiazem (CARTIA XT) 120 MG 24 hr capsule Take 1 capsule (120 mg total) by mouth daily. 05/05/14  Yes Gerlene Fee, NP  DOXYCYCLINE HYCLATE PO Take 100 mg by mouth 2 (two) times daily.   Yes Historical Provider, MD  enoxaparin (LOVENOX) 120 MG/0.8ML injection Inject 0.8 mLs (120 mg total) into the skin daily. 05/06/14  Yes Truitt Merle, MD  latanoprost (XALATAN) 0.005 % ophthalmic solution Place 1 drop into both eyes at bedtime.   Yes Historical Provider, MD  pantoprazole (PROTONIX) 40 MG tablet TAKE 1 TABLET BY MOUTH DAILY 03/28/14  Yes Leandrew Koyanagi, MD  primidone (MYSOLINE) 50 MG tablet Take 50 mg by mouth at bedtime. Patient takes at nite   Yes Historical Provider, MD  propranolol ER (INDERAL LA) 120 MG 24 hr capsule TAKE ONE CAPSULE BY MOUTH DAILY 03/28/14  Yes Mancel Bale, PA-C  sucralfate (CARAFATE) 1 GM/10ML suspension Take 10 mLs (1 g total) by mouth 4 (four) times daily -  with meals and at bedtime. 05/04/14  Yes [name], MD  tamsulosin (FLOMAX) 0.4 MG CAPS capsule Take 1 capsule (0.4 mg total) by mouth daily. 05/04/14  Yes [name], MD  traMADol (ULTRAM) 50 MG tablet Take 1 tablet (50 mg total) by mouth every 6 (six) hours as needed. 05/04/14  Yes [name], MD  gabapentin (NEURONTIN) 300 MG capsule TAKE ONE CAPSULE BY MOUTH AT BEDTIME;  AFTER 7 DAYS, MAY INCREASE TO 2 CAPSULES AT BEDTIME Patient not taking: Reported on 05/09/2014 03/28/14   Mancel Bale, PA-C  loperamide (IMODIUM) 2 MG capsule Take 1 capsule (2 mg total) by mouth 3 (three) times daily before meals. 05/04/14   [name], MD     Physical Exam: Filed Vitals:   05/09/14 0730 05/09/14 0848 05/09/14 0900 05/09/14 0930  BP: 138/78 135/63 128/67 133/63  Pulse: 88 85 81 85  Resp: 22 19 13 15   SpO2: 95% 93% 93% 93%     General: Awake, appears to be in mild distress and is quietly moaning. Oriented 3  HEENT: Normocephalic and Atraumatic, Mucous membranes pink  PERRLA; EOM intact; No scleral icterus,                 Nares: Patent, Oropharynx: Clear, Fair Dentition                 Neck: FROM, no cervical lymphadenopathy, thyromegaly, carotid bruit or JVD;  Breasts: deferred CHEST WALL: No tenderness  CHEST: Normal respiration, decreased breath sounds on the left. Exam is difficult as she has severe pain with sitting up. Drain present in left upper chest with tenderness around it but no signs of cellulitis. HEART: Regular rate and rhythm; no murmurs rubs or gallops  BACK: No kyphosis or scoliosis; no CVA tenderness  GI: Positive Bowel Sounds, soft, tender around left upper quadrant drain-no cellulitis-  Rectal Exam: deferred MSK: No cyanosis, clubbing, or edema Genitalia: not examined  SKIN:  no rash or ulceration  CNS: Alert and Oriented x 4, Nonfocal exam, CN 2-12 intact  Labs on Admission:  Basic Metabolic Panel:  Recent Labs Lab 05/03/14 0440 05/04/14 0415 05/09/14 0721  NA 139 139 141  K 3.7 3.6 3.7  CL 106 105 102  CO2 24 26  --   GLUCOSE 98 95 140*  BUN 22 19 6   CREATININE 1.56* 1.54* 1.10  CALCIUM 8.6 8.6  --   MG 1.8  --   --   PHOS 3.7 3.7  --    Liver Function Tests:  Recent Labs Lab 05/03/14 0440 05/04/14 0415 05/09/14 0655  AST  --   --  38*  ALT  --   --  17  ALKPHOS  --   --  78  BILITOT  --   --  0.5   PROT  --   --  7.6  ALBUMIN 3.1* 3.2* 3.8    Recent Labs Lab 05/09/14 0655  LIPASE 85*   No results for input(s): AMMONIA in the last 168 hours. CBC:  Recent Labs Lab 05/03/14 0440 05/04/14 0415 05/09/14 0655 05/09/14 0721  WBC 9.0 8.3 12.5*  --   HGB 8.5* 8.1* 10.3* 12.6  HCT 27.2* 25.9* 34.2* 37.0  MCV 93.8 94.2 97.2  --   PLT 460* 476* 476*  --    Cardiac Enzymes: No results for input(s): CKTOTAL, CKMB, CKMBINDEX, TROPONINI in the last 168 hours.  BNP (last 3 results) No results for input(s): BNP in the last 8760 hours.  ProBNP (last 3 results) No results for input(s): PROBNP in the last 8760 hours.  CBG:  Recent Labs Lab 05/02/14 1151 05/02/14 1809  GLUCAP 152* 103*    Radiological Exams on Admission: Dg Chest 2 View  05/09/2014   CLINICAL DATA:  69 year old female with shortness of breath and chest pain. Initial encounter.  Personal history of distal pancreatectomy and splenectomy.  EXAM: CHEST  2 VIEW  COMPARISON:  Chest and abdominal series 4 /09/2014. CT Abdomen and Pelvis 04/29/2014, portable chest 04/17/2014.  FINDINGS: Semi upright AP and lateral views of the chest. Left pleural effusion has progressed and is moderate to large. Increased opacification at the left lung base. Lower lung volumes.  Stable cardiac size and mediastinal contours. No pneumothorax or pulmonary edema. Grossly clear right lung. Visualized tracheal air column is within normal limits.  Left abdomen percutaneous drains remain in place. Partially visible IVC filter.  IMPRESSION: 1. Progressed left pleural effusion, now moderate to large. Associated decreased ventilation in the left lung. 2. Lower lung volumes.   Electronically Signed   By: Herminio Heads.D.  On: 05/09/2014 08:49   Dg Abd Acute W/chest  05/08/2014   CLINICAL DATA:  Left upper quadrant and left lower quadrant abdominal pain.  EXAM: DG ABDOMEN ACUTE W/ 1V CHEST  COMPARISON:  04/29/2014  FINDINGS: Two surgical drains are  identified within the left upper quadrant of the abdomen. These are similar in position to 3/30 1/16. There is an IVC filter in place and there are surgical clips from prior cholecystectomy. There is no evidence of dilated bowel loops or free intraperitoneal air. No radiopaque calculi or other significant radiographic abnormality is seen. Heart size and mediastinal contours are within normal limits. Both lungs are clear. Moderate left pleural effusion is again noted.  IMPRESSION: 1. Nonobstructive bowel gas pattern. 2. Left pleural effusion 3. Similar position of left upper quadrant surgical drains.   Electronically Signed   By: Kerby Moors M.D.   On: 05/08/2014 18:27    EKG: Independently reviewed. On his rhythm at 90 bpm  Assessment/Plan Principal Problem:   Pleural effusion/left-sided chest pain - will request IR guided thoracentesis-ER doc spoke with Dr Melvyn Novas - recommended IR guided Thoracentesis and admission to SDU as fluid seems to have increased quickly from yesterday - Consulted Dr. Molli Posey have placed appropriate lab orders for pleural fluid -We'll control her pain with IV morphine and oral hydrocodone if she is not vomiting- I'm still not sure if the pain is coming from the effusion or from the drains-note from Dr. Zenia Resides yesterday mentions that the pain was present at the surgical drain sites which started after her dressing was changed and was noted to be sharp and worse with movement.  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 Barry Dienes but they do not have any appointment as of yet- I have spoken with the on-call surgeon who recommends that the patient make a follow-up appointment with Dr. Barry Dienes in the office    Malnutrition of moderate degree -Currently according to history she is vomiting-place her on clear liquids for today    Peripheral neuropathy -Resume Neurontin as tolerated (nausea and vomiting)   Consulted: Pulmonary  Code Status: Full code  Family Communication: Daughter at bedside  DVT Prophylaxis: Full dose Lovenox on hold for thoracentesis-place SCDs  Time spent: 29 minutes  [place], MD [facility]  If 7PM-7AM, please contact night-coverage [email or web address] 05/09/2014, 11:05 AM

## 2014-05-09 NOTE — ED Provider Notes (Signed)
ED ECG REPORT   Date: 05/09/2014  Rate: 90  Rhythm: normal sinus rhythm  QRS Axis: normal  Intervals: unable to asses due to artifact  ST/T Wave abnormalities: unable to asses due to artifact  Conduction Disutrbances:unable to asses due to artifact  Narrative Interpretation: much muscle/electrical artifact  Old EKG Reviewed: none available    Rolland Porter, MD 05/09/14 0700

## 2014-05-10 ENCOUNTER — Encounter (HOSPITAL_COMMUNITY): Payer: Self-pay | Admitting: *Deleted

## 2014-05-10 ENCOUNTER — Inpatient Hospital Stay (HOSPITAL_COMMUNITY): Payer: Medicare Other

## 2014-05-10 DIAGNOSIS — R071 Chest pain on breathing: Secondary | ICD-10-CM

## 2014-05-10 LAB — CBC
HCT: 29.2 % — ABNORMAL LOW (ref 36.0–46.0)
HEMOGLOBIN: 9.1 g/dL — AB (ref 12.0–15.0)
MCH: 30.1 pg (ref 26.0–34.0)
MCHC: 31.2 g/dL (ref 30.0–36.0)
MCV: 96.7 fL (ref 78.0–100.0)
PLATELETS: 369 10*3/uL (ref 150–400)
RBC: 3.02 MIL/uL — ABNORMAL LOW (ref 3.87–5.11)
RDW: 16.6 % — AB (ref 11.5–15.5)
WBC: 15.4 10*3/uL — ABNORMAL HIGH (ref 4.0–10.5)

## 2014-05-10 LAB — BODY FLUID CELL COUNT WITH DIFFERENTIAL
Lymphs, Fluid: 12 %
MONOCYTE-MACROPHAGE-SEROUS FLUID: 5 % — AB (ref 50–90)
Neutrophil Count, Fluid: 83 % — ABNORMAL HIGH (ref 0–25)
Total Nucleated Cell Count, Fluid: 9317 cu mm — ABNORMAL HIGH (ref 0–1000)

## 2014-05-10 LAB — BASIC METABOLIC PANEL
Anion gap: 12 (ref 5–15)
BUN: 15 mg/dL (ref 6–23)
CALCIUM: 8.7 mg/dL (ref 8.4–10.5)
CO2: 24 mmol/L (ref 19–32)
Chloride: 103 mmol/L (ref 96–112)
Creatinine, Ser: 1.37 mg/dL — ABNORMAL HIGH (ref 0.50–1.10)
GFR calc Af Amer: 45 mL/min — ABNORMAL LOW (ref >90)
GFR, EST NON AFRICAN AMERICAN: 39 mL/min — AB (ref >90)
GLUCOSE: 111 mg/dL — AB (ref 70–99)
Potassium: 3.3 mmol/L — ABNORMAL LOW (ref 3.5–5.1)
Sodium: 139 mmol/L (ref 135–145)

## 2014-05-10 LAB — HEPARIN LEVEL (UNFRACTIONATED): Heparin Unfractionated: 0.53 IU/mL (ref 0.30–0.70)

## 2014-05-10 LAB — PROTEIN, TOTAL: TOTAL PROTEIN: 6.7 g/dL (ref 6.0–8.3)

## 2014-05-10 LAB — LACTATE DEHYDROGENASE: LDH: 147 U/L (ref 94–250)

## 2014-05-10 LAB — PH, BODY FLUID: pH, Fluid: 7

## 2014-05-10 LAB — PROTEIN, BODY FLUID: TOTAL PROTEIN, FLUID: 4.4 g/dL

## 2014-05-10 LAB — GLUCOSE, SEROUS FLUID: Glucose, Fluid: 58 mg/dL

## 2014-05-10 LAB — LACTATE DEHYDROGENASE, PLEURAL OR PERITONEAL FLUID: LD FL: 295 U/L — AB (ref 3–23)

## 2014-05-10 MED ORDER — CIPROFLOXACIN IN D5W 400 MG/200ML IV SOLN
400.0000 mg | Freq: Two times a day (BID) | INTRAVENOUS | Status: DC
  Administered 2014-05-10 – 2014-05-16 (×12): 400 mg via INTRAVENOUS
  Filled 2014-05-10 (×12): qty 200

## 2014-05-10 MED ORDER — DILTIAZEM HCL 30 MG PO TABS
30.0000 mg | ORAL_TABLET | Freq: Four times a day (QID) | ORAL | Status: DC
  Administered 2014-05-10 – 2014-05-21 (×45): 30 mg via ORAL
  Filled 2014-05-10 (×46): qty 1

## 2014-05-10 MED ORDER — DOXYCYCLINE HYCLATE 100 MG PO TABS
100.0000 mg | ORAL_TABLET | Freq: Two times a day (BID) | ORAL | Status: DC
  Administered 2014-05-10 – 2014-05-11 (×4): 100 mg via ORAL
  Filled 2014-05-10 (×4): qty 1

## 2014-05-10 MED ORDER — POTASSIUM CHLORIDE CRYS ER 20 MEQ PO TBCR
40.0000 meq | EXTENDED_RELEASE_TABLET | Freq: Once | ORAL | Status: AC
  Administered 2014-05-10: 40 meq via ORAL
  Filled 2014-05-10: qty 2

## 2014-05-10 MED ORDER — BOOST / RESOURCE BREEZE PO LIQD: 1.0000 | Freq: Two times a day (BID) | ORAL | Status: DC

## 2014-05-10 MED ORDER — SODIUM CHLORIDE 0.9 % IV BOLUS (SEPSIS)
250.0000 mL | Freq: Once | INTRAVENOUS | Status: AC
  Administered 2014-05-10: 250 mL via INTRAVENOUS

## 2014-05-10 MED ORDER — SODIUM CHLORIDE 0.9 % IV SOLN
INTRAVENOUS | Status: DC
  Administered 2014-05-10: 08:00:00 via INTRAVENOUS
  Administered 2014-05-11: 75 mL/h via INTRAVENOUS

## 2014-05-10 MED ORDER — PIPERACILLIN-TAZOBACTAM 3.375 G IVPB
3.3750 g | Freq: Three times a day (TID) | INTRAVENOUS | Status: DC
  Administered 2014-05-10 – 2014-05-16 (×18): 3.375 g via INTRAVENOUS
  Filled 2014-05-10 (×19): qty 50

## 2014-05-10 NOTE — Progress Notes (Addendum)
[facility] Progress Note   [name] IRW:[id number] DOB: 04/15/1945 DOA: 05/09/2014 PCP: Mauricio Po, FNP  Brief narrative: [name] is a 69 y.o. 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 She has followed with Dr Burr Medico this past Friday and the plan is to start Chemo soon.    She has a history of DVT 10 years ago and was placed on Coumadin and had an IVC filter placed. She was found in the fall of 2015 to have an acute DVT in the left leg also a thrombus involving the IVC filter. Patient is on Lovenox per Dr. Burr Medico who mentions in her note that she will need anticoagulation indefinitely. Other past medical history includes peripheral neuropathy, essential tremor, "fast heart rate" on Inderal, hyperlipidemia.  The patient presents for increased pain in left lower chest/ upper abdomen- please see my H and P for further details. Found to have a moderate sized pleural effusion- question if pain is pleuritic due to the effusion or due to drains. Pulm recommends IR guided thoracentesis which is ordered. Pain is better controlled now although she still hurts when I try to examine the drains or when she moves.   Subjective: States pain is controlled but as mentioned above, has pain when I examine her. No further nausea or vomiting since being in the hospital.   Assessment/Plan: Principal Problem: Left chest/ abdominal pain - partly due to drains and possibly due to effusion with pleuritic component- will see if thoracentesis improves the pain - PRN IV Morpine and Hydrocodone-     Pleural effusion - cause uncertain -  CT chest ordered by Dr Melvyn Novas does not reveal loculations- it is a moderate sized effusion - etiology could be related to pancreatic cancer- IR guided thora- labs  ordered- PCCM to assist with management- Dr Melvyn Novas recommended to admit to SDU - pulse ox 93% on room air ADDENDUM: pleural fluid reveals about 9000 WBCs 90% neutrophils- possibly an extension of abdominal abscess as she has no pneumonia - abscess culture grew pseudomonas previously- d/w Dr Elsworth Soho- decided to give Zosyn and Cipro- await final culture results - surgery team may want to do a  CT of abdomen?  Hypotension - BP in 90s - received a 250 cc bolus ordered by night NP- will start slow IVF as she is NPO since last night and had been vomiting prior to this - stopped Inderal- changed long acting Cardizem to short acting with holding parameters  H/o "fast Heart rate" - suspect this may be why she is on Inderal and Cardizem as ordered by her PCP in the past- never been told she has A-fib    Hx DVT - as mentioned above, Dr Burr Medico recommends anticoagulation indefinitely- Lovenox changed to Heparin due to procedure today - mildly tachycardic at 100-105 currently     Pancreas cancer of tail s/p distal pancreatectomy, splenectomy, & partial colectomy 04/08/2014-   Intra-abdominal abscess -patient and daughter state she needs to f/u with Dr Barry Dienes but does not have an appt -  have asked surgery team to assist with management- possibly can remove drains? - cont Doxy- was prescribed a 10 day course on discharge- stop date would be 4/15 - has oupt f/u with Dr Burr Medico to start chemo treatment- I have not yet consulted her in the hospital  Malnutrition of moderate degree - nutrition consulted  Urinary retention - despite multiple voiding trails after surgery, she continued to retain urine- foley was continued- - was to see [facility] today but now admitted - has been on Flomax since last admission - recommend another voiding trial when stable  Morbid obesity Body mass index is 28.41 kg/(m^2).   Tremors  - on Primidone  NOTE: was placed on Protonix and Carafate for GI protection by surgery  last admission- Dr Melvyn Novas has increased Protonix to BID   Code Status: full code Family Communication: daughter who has been at bedside daily Disposition Plan: f/u on pleural fluid results and pain DVT prophylaxis: Heparin infusion Consultants:PCCM, surgery Procedures:  Antibiotics: Anti-infectives    None      Objective: Filed Weights   05/09/14 1234  Weight: 88.5 kg (195 lb 1.7 oz)    Intake/Output Summary (Last 24 hours) at 05/10/14 0941 Last data filed at 05/10/14 0859  Gross per 24 hour  Intake 749.03 ml  Output    853 ml  Net -103.97 ml     Vitals Filed Vitals:   05/10/14 0600 05/10/14 0800 05/10/14 0809 05/10/14 0857  BP: 99/55 115/50  115/50  Pulse: 99 92    Temp:   98.3 F (36.8 C)   TempSrc:   Oral   Resp: 9 15    Height:      Weight:      SpO2: 95% 0%      Exam:  General:  Pt is alert, not in acute distress- flat affect- very depressed- morbidly obese  HEENT: No icterus, No thrush  Cardiovascular: regular rate and rhythm, S1/S2 No murmur  Respiratory: clear to auscultation on right- poor air entry on left - unable to take a deep breath- begins to moan in pain  Abdomen: Soft, +Bowel sounds, non distended- significant tenderness at drain sites- a small amount of bloody drainage in drains which in the left upper abdomen/ chest area- one staple in right lower abdomen  MSK: No LE edema, cyanosis or clubbing  Data Reviewed: Basic Metabolic Panel:  Recent Labs Lab 05/04/14 0415 05/09/14 0721 05/10/14 0350  NA 139 141 139  K 3.6 3.7 3.3*  CL 105 102 103  CO2 26  --  24  GLUCOSE 95 140* 111*  BUN 19 6 15   CREATININE 1.54* 1.10 1.37*  CALCIUM 8.6  --  8.7  PHOS 3.7  --   --    Liver Function Tests:  Recent Labs Lab 05/04/14 0415 05/09/14 0655 05/10/14 0350  AST  --  38*  --   ALT  --  17  --   ALKPHOS  --  78  --   BILITOT  --  0.5  --   PROT  --  7.6 6.7  ALBUMIN 3.2* 3.8  --     Recent Labs Lab 05/09/14 0655  LIPASE 85*    No results for input(s): AMMONIA in the last 168 hours. CBC:  Recent Labs Lab 05/04/14 0415 05/09/14 0655 05/09/14 0721 05/10/14 0350  WBC 8.3 12.5*  --  15.4*  HGB 8.1* 10.3* 12.6 9.1*  HCT 25.9* 34.2* 37.0 29.2*  MCV 94.2 97.2  --  96.7  PLT 476* 476*  --  369   Cardiac Enzymes: No results for input(s): CKTOTAL, CKMB, CKMBINDEX, TROPONINI in the last 168 hours. BNP (last 3 results) No results for input(s): BNP in the last 8760 hours.  ProBNP (last 3 results) No results for input(s):  PROBNP in the last 8760 hours.  CBG: No results for input(s): GLUCAP in the last 168 hours.  Recent Results (from the past 240 hour(s))  MRSA PCR Screening     Status: None   Collection Time: 05/09/14 12:27 PM  Result Value Ref Range Status   MRSA by PCR NEGATIVE NEGATIVE Final    Comment:        The GeneXpert MRSA Assay (FDA approved for NASAL specimens only), is one component of a comprehensive MRSA colonization surveillance program. It is not intended to diagnose MRSA infection nor to guide or monitor treatment for MRSA infections.      Studies:  Recent x-ray studies have been reviewed in detail by the Attending Physician  Scheduled Meds:  Scheduled Meds: . diltiazem  30 mg Oral 4 times per day  . latanoprost  1 drop Both Eyes QHS  . pantoprazole  40 mg Oral BID AC  . primidone  50 mg Oral QHS  . sodium chloride  3 mL Intravenous Q12H  . sucralfate  1 g Oral TID WC & HS  . tamsulosin  0.4 mg Oral Daily   Continuous Infusions: . sodium chloride 75 mL/hr at 05/10/14 0818  . heparin Stopped (05/10/14 0908)    Time spent on care of this patient: 61 min   [place], MD 05/10/2014, 9:41 AM  LOS: 1 day   [facility] Office  [phone] Pager - Text Page per [email or web address]  If 7PM-7AM, please contact night-coverage [email or web address]

## 2014-05-10 NOTE — Procedures (Signed)
Successful US guided left thoracentesis. Yielded 500 ml of blood tinged fluid. Pt tolerated procedure well. No immediate complications.  Specimen was sent for labs. CXR ordered.  Tsosie Billing D PA-C 05/10/2014 1:42 PM

## 2014-05-10 NOTE — Progress Notes (Signed)
PULMONARY / CRITICAL CARE MEDICINE   Name: [name] MRN: [id number] DOB: [birth year]    ADMISSION DATE:  05/09/2014 CONSULTATION DATE:  05/09/14  REFERRING MD :  [facility]/ [facility]   CHIEF COMPLAINT:  New L CP  INITIAL PRESENTATION: 19 yobf recovering from extensive surgery for pancreatic Ca with multiple drains in place for LUQ abscess post op but tapered opioids down to one daily abruptly worse with R Upper/ Lat CP > ER am 4/10 with worsening L lung aeration ? Effusion > admitted for further eval and PCCM service asked to see pma 4.10   past medical history of pancreatic cancer status post distal pancreatectomy, splenectomy and partial colectomy on 04/08/2014. She has a history of DVT 10 years ago and was placed on Coumadin and had an IVC filter placed. She was found in the fall of 2015 to have an acute DVT in the left leg also a thrombus involving the IVC filter. Patient is on Lovenox (Dr. Burr Medico) & needs anticoagulation indefinitely. Other past medical history includes peripheral neuropathy, essential tremor, "fast heart rate" on Inderal, hyperlipidemia.   STUDIES: CTa chest 4/10 >> no PE, Moderate left pleural effusion , drainage catheter seen between the stomach and left kidney, stable appearance of fluid collection medial to proximal stomach Compared to 3/31   SIGNIFICANT EVENTS:       SUBJECTIVE: afebrile Continues to have left chest pain Uncomfortable lying flat, better sitting up due to pain > sob  VITAL SIGNS: Temp:  [98.3 F (36.8 C)-99.4 F (37.4 C)] 98.3 F (36.8 C) (04/11 0809) Pulse Rate:  [85-105] 99 (04/11 0600) Resp:  [9-29] 9 (04/11 0600) BP: (92-150)/(36-73) 115/50 mmHg (04/11 0857) SpO2:  [92 %-99 %] 95 % (04/11 0600) Weight:  [88.5 kg (195 lb 1.7 oz)] 88.5 kg (195 lb 1.7 oz) (04/10 1234) HEMODYNAMICS:   VENTILATOR SETTINGS:   INTAKE / OUTPUT:  Intake/Output Summary (Last 24 hours) at 05/10/14 0916 Last data filed at 05/10/14 0859  Gross per 24 hour   Intake 749.03 ml  Output    853 ml  Net -103.97 ml    PHYSICAL EXAMINATION: General: chronically > acutely ill, not toxic  Pt alert, approp nad   No jvd Oropharynx clear Neck supple Lungs  decreaed bs L base, no rhonchi RRR no s3 or or sign murmur Abd obese with poor excursion, 2 drains and diffuse mild tenderness, no rebound  Ext wam with no edema or clubbing noted Neuro  No motor deficits   LABS:  CBC  Recent Labs Lab 05/04/14 0415 05/09/14 0655 05/09/14 0721 05/10/14 0350  WBC 8.3 12.5*  --  15.4*  HGB 8.1* 10.3* 12.6 9.1*  HCT 25.9* 34.2* 37.0 29.2*  PLT 476* 476*  --  369   Coag's  Recent Labs Lab 05/09/14 0655  INR 1.08   BMET  Recent Labs Lab 05/04/14 0415 05/09/14 0721 05/10/14 0350  NA 139 141 139  K 3.6 3.7 3.3*  CL 105 102 103  CO2 26  --  24  BUN 19 6 15   CREATININE 1.54* 1.10 1.37*  GLUCOSE 95 140* 111*   Electrolytes  Recent Labs Lab 05/04/14 0415 05/10/14 0350  CALCIUM 8.6 8.7  PHOS 3.7  --    Sepsis Markers No results for input(s): LATICACIDVEN, PROCALCITON, O2SATVEN in the last 168 hours. ABG No results for input(s): PHART, PCO2ART, PO2ART in the last 168 hours. Liver Enzymes  Recent Labs Lab 05/04/14 0415 05/09/14 0655  AST  --  38*  ALT  --  17  ALKPHOS  --  78  BILITOT  --  0.5  ALBUMIN 3.2* 3.8   Cardiac Enzymes No results for input(s): TROPONINI, PROBNP in the last 168 hours. Glucose No results for input(s): GLUCAP in the last 168 hours.  Imaging Dg Chest 2 View  05/09/2014   CLINICAL DATA:  69 year old female with shortness of breath and chest pain. Initial encounter.  Personal history of distal pancreatectomy and splenectomy.  EXAM: CHEST  2 VIEW  COMPARISON:  Chest and abdominal series 4 /09/2014. CT Abdomen and Pelvis 04/29/2014, portable chest 04/17/2014.  FINDINGS: Semi upright AP and lateral views of the chest. Left pleural effusion has progressed and is moderate to large. Increased opacification at  the left lung base. Lower lung volumes.  Stable cardiac size and mediastinal contours. No pneumothorax or pulmonary edema. Grossly clear right lung. Visualized tracheal air column is within normal limits.  Left abdomen percutaneous drains remain in place. Partially visible IVC filter.  IMPRESSION: 1. Progressed left pleural effusion, now moderate to large. Associated decreased ventilation in the left lung. 2. Lower lung volumes.   Electronically Signed   By: Genevie Ann M.D.   On: 05/09/2014 08:49   Ct Angio Chest Pe W/cm &/or Wo Cm  05/09/2014   CLINICAL DATA:  Shortness of breath, chest pain.  EXAM: CT ANGIOGRAPHY CHEST WITH CONTRAST  TECHNIQUE: Multidetector CT imaging of the chest was performed using the standard protocol during bolus administration of intravenous contrast. Multiplanar CT image reconstructions and MIPs were obtained to evaluate the vascular anatomy.  CONTRAST:  16mL OMNIPAQUE IOHEXOL 350 MG/ML SOLN  COMPARISON:  CT scan of April 29, 2014.  FINDINGS: No pneumothorax is noted. Moderate left pleural effusion is noted with associated atelectasis of the left lower lobe. Right lung is clear. There is no evidence of thoracic aortic dissection or aneurysm. There is no definite evidence of pulmonary embolus. No mediastinal mass or adenopathy is noted. No significant osseous abnormality is noted. Continued presence of drainage catheter seen between the stomach and left kidney. Continued presence of fluid collection seen medial to proximal stomach.  Review of the MIP images confirms the above findings.  IMPRESSION: No evidence of pulmonary embolus.  Moderate left pleural effusion is noted with associated atelectasis of the left lower lobe.  Continued presence of drainage catheter seen between the stomach and left kidney, as well as continued and stable appearance of fluid collection medial to proximal stomach. Please refer to report of CT scan of April 29, 2014.   Electronically Signed   By: Marijo Conception, M.D.   On: 05/09/2014 16:56     ASSESSMENT / PLAN:  1)  Acute onset L cp new pattern from her LUQ pain post op assoc with poor aeration L base and ? Worse effusion - main issue is pain control, not hypoxemia or wob   ddx =  sympathetic effusion/ empyema related to recent LUQ abscess, atx.  Malignancy less likely but not excluded entirely. Hemothorax seems much less likely  Prior DVT s/p IVC filter  Rec:  Full IV Hep - hold now in anticipation of thora today  Hold abx unless spikes (pending sampling of the fluid)    Discussed with pt and daughter > risks & benefits of thoracentesis discussed   Kara Mead MD. FCCP. [facility] Pager [phone] If no response call [phone]

## 2014-05-10 NOTE — Progress Notes (Signed)
ANTICOAGULATION CONSULT NOTE - Follow Up Consult  Pharmacy Consult for Heparin (PTA Lovenox on hold for procedure) Indication: hx DVT  Allergies  Allergen Reactions  . Sulfa Antibiotics Swelling    Patient Measurements: Height: 5' 9.5" (176.5 cm) Weight: 195 lb 1.7 oz (88.5 kg) IBW/kg (Calculated) : 67.35 Heparin Dosing Weight: 85 kg  Vital Signs: Temp: 98.9 F (37.2 C) (04/11 0400) Temp Source: Oral (04/11 0400) BP: 99/55 mmHg (04/11 0600) Pulse Rate: 99 (04/11 0600)  Labs:  Recent Labs  05/09/14 0655 05/09/14 0721 05/09/14 2056 05/10/14 0350  HGB 10.3* 12.6  --  9.1*  HCT 34.2* 37.0  --  29.2*  PLT 476*  --   --  369  LABPROT 14.1  --   --   --   INR 1.08  --   --   --   HEPARINUNFRC  --   --  0.70 0.53  CREATININE  --  1.10  --  1.37*    Estimated Creatinine Clearance: 47.7 mL/min (by C-G formula based on Cr of 1.37).  Assessment: Patient is a 69 y.o. F with pancreatic cancer and s/p IVC filter on lovenox PTA for hx PE and recurrent DVTs.  Lovenox transitioned to heparin in anticipation of thoracentesis.  Chest CT angio on 4/10 negative for new PE.  Heparin level is therapeutic at 0.53.  No bleeding documented.   Goal of Therapy:  Heparin level 0.3-0.7 units/ml Monitor platelets by anticoagulation protocol: Yes   Plan:  - continue heparin drip at 1200 units/hr - f/u with patient after thoracentesis  Averleigh Savary P 05/10/2014,7:12 AM

## 2014-05-10 NOTE — Progress Notes (Signed)
ANTIBIOTIC CONSULT NOTE - INITIAL  Pharmacy Consult for Zosyn, Cipro Indication: empyema  Allergies  Allergen Reactions  . Sulfa Antibiotics Swelling    Patient Measurements: Height: 5' 9.5" (176.5 cm) Weight: 195 lb 1.7 oz (88.5 kg) IBW/kg (Calculated) : 67.35  Vital Signs: Temp: 98.9 F (37.2 C) (04/11 1600) Temp Source: Oral (04/11 1600) BP: 103/54 mmHg (04/11 1711) Pulse Rate: 109 (04/11 1400) Intake/Output from previous day: 04/10 0701 - 04/11 0700 In: 736 [P.O.:180; I.V.:303; IV Piggyback:250] Out: 813 [Urine:780; Drains:33] Intake/Output from this shift: Total I/O In: 510.6 [I.V.:500.6; Other:10] Out: 40 [Urine:40]  Labs:  Recent Labs  05/09/14 0655 05/09/14 0721 05/10/14 0350  WBC 12.5*  --  15.4*  HGB 10.3* 12.6 9.1*  PLT 476*  --  369  CREATININE  --  1.10 1.37*   Estimated Creatinine Clearance: 47.7 mL/min (by C-G formula based on Cr of 1.37). No results for input(s): VANCOTROUGH, VANCOPEAK, VANCORANDOM, GENTTROUGH, GENTPEAK, GENTRANDOM, TOBRATROUGH, TOBRAPEAK, TOBRARND, AMIKACINPEAK, AMIKACINTROU, AMIKACIN in the last 72 hours.   Microbiology: Recent Results (from the past 720 hour(s))  Culture, routine-abscess     Status: None   Collection Time: 04/16/14  3:41 PM  Result Value Ref Range Status   Specimen Description DRAINAGE  Final   Special Requests Normal  Final   Gram Stain   Final    NO WBC SEEN NO SQUAMOUS EPITHELIAL CELLS SEEN NO ORGANISMS SEEN Performed at [facility]    Culture   Final    FEW PSEUDOMONAS AERUGINOSA Performed at [facility]    Report Status 04/20/2014 FINAL  Final   Organism ID, Bacteria PSEUDOMONAS AERUGINOSA  Final      Susceptibility   Pseudomonas aeruginosa - MIC*    CEFEPIME <=1 SENSITIVE Sensitive     CEFTAZIDIME 2 SENSITIVE Sensitive     CIPROFLOXACIN <=0.25 SENSITIVE Sensitive     GENTAMICIN <=1 SENSITIVE Sensitive     IMIPENEM 1 SENSITIVE Sensitive     PIP/TAZO 8 SENSITIVE Sensitive      TOBRAMYCIN <=1 SENSITIVE Sensitive     * FEW PSEUDOMONAS AERUGINOSA  Clostridium Difficile by PCR     Status: None   Collection Time: 04/25/14  1:53 AM  Result Value Ref Range Status   C difficile by pcr NEGATIVE NEGATIVE Final  MRSA PCR Screening     Status: None   Collection Time: 05/09/14 12:27 PM  Result Value Ref Range Status   MRSA by PCR NEGATIVE NEGATIVE Final    Comment:        The GeneXpert MRSA Assay (FDA approved for NASAL specimens only), is one component of a comprehensive MRSA colonization surveillance program. It is not intended to diagnose MRSA infection nor to guide or monitor treatment for MRSA infections.     Medical History: Past Medical History  Diagnosis Date  . Glaucoma   . Clotting disorder   . DVT (deep venous thrombosis)     LEFT  . GERD (gastroesophageal reflux disease)   . Hypercholesterolemia   . Peripheral neuropathy   . Essential tremor   . Diverticulosis   . Arthritis     knees and back  . Cancer     Pancreatic  . Hypertension   . Colon polyps   . UTI (lower urinary tract infection)   . Complication of anesthesia     slow to wak up   . Pulmonary embolism     Assessment: 38 yoF with recently diagnosed adenocarcinoma of the pancreas status post distal  pancreatectomy, splenectomy and partial colectomy on 04/08/2014 with subsequent LUQ abscess- 2 drains still present in left abdomen and chest.  Pleural fluid reveals about 9000 WBCs 90% neutrophils- possibly an extension of abdominal abscess as she has no pneumonia - abscess culture grew pseudomonas previously.  Pharmacy consulted to dose Zosyn and Cipro for empyema.  Await culture results.  4/11 >> Doxycycline >> (4/15) 4/11 >> Zosyn >> 4/11 >> Cipro >>  Tmax: 99.9 WBC: elevated Renal: SCr elevated at 1.37, CrCl~55 ml/min  4/11 pleural fluid culture: sent  Goal of Therapy:  Doses adjusted per renal function Eradication of infection  Plan:  1.  Zosyn 3.375g IV q8h (4 hour  infusion time). 2.  Cipro 400 mg IV q12h.  Hershal Coria 05/10/2014,6:12 PM

## 2014-05-10 NOTE — Progress Notes (Signed)
Subjective: 69 y/o woman who presented to the ED 12/22/13 with DVT.  CT scan to rule out PE showed a mass in the tail of the pancrease concerning for carcinoma, with no evidence of metastatic disease. MRI showed Hypo enhancing pancreatic tail mass without findings of metastatic disease to the liver, or retroperitoneum. The only potential vascular encasement/involvement is and the splenic vessels which skirt the upper margin of the mass. There was also thrombus in the IVC, below the level of an IVC filter. She was treated for her DVT and had a needle biopsy/EGD/EUS, this was positive for Adenocarcinoma of the pancreas.   On 04/08/14 she underwent Robotic converted to laparoscopic hand-assisted distal pancreatectomy and splenectomy, partial colectomy by Dr. Barry Dienes. Post op she had fever and elevated WBC.  CT ion 3/17 revealed 2 fluid collections concerning for abscess. On 04/16/14 she had a percutaneous drains placed by IR left upper abdominal quadrant, anterior to the left kidney with aspiration of 25 mL of purulent fluid. Post op she also had issues with poor PO intake, urinary retention, anemia, acute renal failure, post op gastroparesis,malnutrition, resulting in TNA supplement for some time. She was discharged home to SNF on 05/04/14, and was to follow up with urology for her Urinary retention with Dr. Junious Silk in 2-3 weeks and Dr. Barry Dienes in 2 weeks.    She returned to the ED on 4/9 with pain from the two drains.  They were found to be normal and she returned to the SNF. She presented again 05/09/14 with complaints of chest pain. Chest CT scan at that time showed she did not have a PE, but did have a large left pleural effusion with atelectasis. She was admitted for her pleural effusion, along with complaints of chest pain and requiring IV narcotics for pain control  Since admission she has been afebrile,until this AM, a little tachycardic.  WBC is up, and her renal function has declined some  CXR shows:  . Progressed left pleural effusion, now moderate to large. Associated decreased ventilation in the left lung. Lower lung volumes. CT of the chest shows: No evidence of pulmonary embolus. Moderate left pleural effusion is noted with associated atelectasis of the left lower lobe.  Continued presence of drainage catheter seen between the stomach and left kidney, as well as continued and stable appearance of fluid collection medial to proximal stomach.  We were ask to see again for follow up.  Objective: Vital signs in last 24 hours: Temp:  [98.3 F (36.8 C)-99.4 F (37.4 C)] 98.3 F (36.8 C) (04/11 0809) Pulse Rate:  [85-105] 92 (04/11 0800) Resp:  [9-29] 15 (04/11 0800) BP: (92-150)/(36-73) 115/50 mmHg (04/11 0857) SpO2:  [0 %-99 %] 0 % (04/11 0800) Weight:  [88.5 kg (195 lb 1.7 oz)] 88.5 kg (195 lb 1.7 oz) (04/10 1234) Last BM Date: 05/08/14 Low grade temp now No fever at home +nausea after taking pills, not associated with meals  Intake/Output from previous day: 04/10 0701 - 04/11 0700 In: 736 [P.O.:180; I.V.:303; IV Piggyback:250] Out: 813 [Urine:780; Drains:33] Intake/Output this shift: Total I/O In: 13 [I.V.:3; Other:10] Out: 40 [Urine:40]  General appearance: alert, cooperative, no distress and clearly feels bad. Resp: BS down on left and some rales in base on right GI: soft a little distended.  Incisions all healing nicely.  2 drains on the left with brownish colored drainage, but not much in bulbs. Extremities: no edema  Lab Results:   Recent Labs  05/09/14 0655 05/09/14 0721 05/10/14 0350  WBC 12.5*  --  15.4*  HGB 10.3* 12.6 9.1*  HCT 34.2* 37.0 29.2*  PLT 476*  --  369    BMET  Recent Labs  05/09/14 0721 05/10/14 0350  NA 141 139  K 3.7 3.3*  CL 102 103  CO2  --  24  GLUCOSE 140* 111*  BUN 6 15  CREATININE 1.10 1.37*  CALCIUM  --  8.7   PT/INR  Recent Labs  05/09/14 0655  LABPROT 14.1  INR 1.08     Recent Labs Lab 05/04/14 0415  05/09/14 0655 05/10/14 0350  AST  --  38*  --   ALT  --  17  --   ALKPHOS  --  78  --   BILITOT  --  0.5  --   PROT  --  7.6 6.7  ALBUMIN 3.2* 3.8  --      Lipase     Component Value Date/Time   LIPASE 85* 05/09/2014 0655     Studies/Results: Dg Chest 2 View  05/09/2014   CLINICAL DATA:  69 year old female with shortness of breath and chest pain. Initial encounter.  Personal history of distal pancreatectomy and splenectomy.  EXAM: CHEST  2 VIEW  COMPARISON:  Chest and abdominal series 4 /09/2014. CT Abdomen and Pelvis 04/29/2014, portable chest 04/17/2014.  FINDINGS: Semi upright AP and lateral views of the chest. Left pleural effusion has progressed and is moderate to large. Increased opacification at the left lung base. Lower lung volumes.  Stable cardiac size and mediastinal contours. No pneumothorax or pulmonary edema. Grossly clear right lung. Visualized tracheal air column is within normal limits.  Left abdomen percutaneous drains remain in place. Partially visible IVC filter.  IMPRESSION: 1. Progressed left pleural effusion, now moderate to large. Associated decreased ventilation in the left lung. 2. Lower lung volumes.   Electronically Signed   By: Genevie Ann M.D.   On: 05/09/2014 08:49   Ct Angio Chest Pe W/cm &/or Wo Cm  05/09/2014   CLINICAL DATA:  Shortness of breath, chest pain.  EXAM: CT ANGIOGRAPHY CHEST WITH CONTRAST  TECHNIQUE: Multidetector CT imaging of the chest was performed using the standard protocol during bolus administration of intravenous contrast. Multiplanar CT image reconstructions and MIPs were obtained to evaluate the vascular anatomy.  CONTRAST:  134mL OMNIPAQUE IOHEXOL 350 MG/ML SOLN  COMPARISON:  CT scan of April 29, 2014.  FINDINGS: No pneumothorax is noted. Moderate left pleural effusion is noted with associated atelectasis of the left lower lobe. Right lung is clear. There is no evidence of thoracic aortic dissection or aneurysm. There is no definite evidence  of pulmonary embolus. No mediastinal mass or adenopathy is noted. No significant osseous abnormality is noted. Continued presence of drainage catheter seen between the stomach and left kidney. Continued presence of fluid collection seen medial to proximal stomach.  Review of the MIP images confirms the above findings.  IMPRESSION: No evidence of pulmonary embolus.  Moderate left pleural effusion is noted with associated atelectasis of the left lower lobe.  Continued presence of drainage catheter seen between the stomach and left kidney, as well as continued and stable appearance of fluid collection medial to proximal stomach. Please refer to report of CT scan of April 29, 2014.   Electronically Signed   By: Marijo Conception, M.D.   On: 05/09/2014 16:56   Dg Abd Acute W/chest  05/08/2014   CLINICAL DATA:  Left upper quadrant and left lower quadrant abdominal pain.  EXAM:  DG ABDOMEN ACUTE W/ 1V CHEST  COMPARISON:  04/29/2014  FINDINGS: Two surgical drains are identified within the left upper quadrant of the abdomen. These are similar in position to 3/30 1/16. There is an IVC filter in place and there are surgical clips from prior cholecystectomy. There is no evidence of dilated bowel loops or free intraperitoneal air. No radiopaque calculi or other significant radiographic abnormality is seen. Heart size and mediastinal contours are within normal limits. Both lungs are clear. Moderate left pleural effusion is again noted.  IMPRESSION: 1. Nonobstructive bowel gas pattern. 2. Left pleural effusion 3. Similar position of left upper quadrant surgical drains.   Electronically Signed   By: Kerby Moors M.D.   On: 05/08/2014 18:27    Medications: . diltiazem  30 mg Oral 4 times per day  . doxycycline  100 mg Oral BID  . latanoprost  1 drop Both Eyes QHS  . pantoprazole  40 mg Oral BID AC  . primidone  50 mg Oral QHS  . sodium chloride  3 mL Intravenous Q12H  . sucralfate  1 g Oral TID WC & HS  . tamsulosin  0.4  mg Oral Daily   . sodium chloride 75 mL/hr at 05/10/14 0818  . heparin Stopped (05/10/14 0908)   Prior to Admission medications   Medication Sig Start Date End Date Taking? Authorizing Provider  atorvastatin (LIPITOR) 40 MG tablet Take 1 tablet (40 mg total) by mouth daily. 05/05/14  Yes Gerlene Fee, NP  diltiazem (CARTIA XT) 120 MG 24 hr capsule Take 1 capsule (120 mg total) by mouth daily. 05/05/14  Yes Gerlene Fee, NP  DOXYCYCLINE HYCLATE PO Take 100 mg by mouth 2 (two) times daily.   Yes Historical Provider, MD  enoxaparin (LOVENOX) 120 MG/0.8ML injection Inject 0.8 mLs (120 mg total) into the skin daily. 05/06/14  Yes Truitt Merle, MD  latanoprost (XALATAN) 0.005 % ophthalmic solution Place 1 drop into both eyes at bedtime.   Yes Historical Provider, MD  pantoprazole (PROTONIX) 40 MG tablet TAKE 1 TABLET BY MOUTH DAILY 03/28/14  Yes Leandrew Koyanagi, MD  primidone (MYSOLINE) 50 MG tablet Take 50 mg by mouth at bedtime. Patient takes at nite   Yes Historical Provider, MD  propranolol ER (INDERAL LA) 120 MG 24 hr capsule TAKE ONE CAPSULE BY MOUTH DAILY 03/28/14  Yes Mancel Bale, PA-C  sucralfate (CARAFATE) 1 GM/10ML suspension Take 10 mLs (1 g total) by mouth 4 (four) times daily -  with meals and at bedtime. 05/04/14  Yes [name], MD  tamsulosin (FLOMAX) 0.4 MG CAPS capsule Take 1 capsule (0.4 mg total) by mouth daily. 05/04/14  Yes [name], MD  traMADol (ULTRAM) 50 MG tablet Take 1 tablet (50 mg total) by mouth every 6 (six) hours as needed. 05/04/14  Yes [name], MD  loperamide (IMODIUM) 2 MG capsule Take 1 capsule (2 mg total) by mouth 3 (three) times daily before meals. 05/04/14   [name], MD     Assessment/Plan 1.  Adenocarcinoma of the pancreas pT3, pN0. 2.  Robotic converted to laparoscopic hand-assisted distal pancreatectomy and splenectomy, partial colectomy by Dr. Barry Dienes, 04/08/14. 3.  Post op abscess with IR drain x 2 4.  Post op renal insuffiencey 5.  DVT on  chronic anticoagulation 6.  GERD/postop gastroparesis 7.  Urinary retention 8.  Malnutrition 9.  Peripheral neuropathy/essential tremours 10.  Tachycardia 11.  SCD/Heparin drip for DVT 12.  Just started on doxycycline today.  1100 hrs.    Plan:  She was scheduled to follow up with DR. Byerly on 4/18.  Her last abdominal CT scan was 04/29/14. I would repeat that and see how the drained sites are doing.  I would not do it till her renal function is back to baseline.  I will ask IR to see and write orders for irrigating drains.  She is going down now for her thoracentesis now.  She has a low grade fever now and that is new.  I will recheck her labs in AM, lipase up some also.  Start irrigation of the drains again.    LOS: 1 day    Enrika Aguado 05/10/2014

## 2014-05-10 NOTE — Progress Notes (Addendum)
INITIAL NUTRITION ASSESSMENT  DOCUMENTATION CODES Per approved criteria  -Not Applicable   INTERVENTION: Advance back to Cardiac diet within 12 hours  Will order Resource Breeze po BID, each supplement provides 250 kcal and 9 grams of protein to trial  RD to continue to monitor for needs   NUTRITION DIAGNOSIS: Increased nutrient (protein) needs related to biological demands, medical course as evidenced by pancreatic cancer with surgical intervention 1 month ago.   Goal: Pt to meet >/=90% estimated needs with diet re-advancement  Monitor:  Diet re-advancement, intakes of meals, acceptance and intake of supplements, weight trends, labs, I/O's  Reason for Assessment: Consult for needs/requirements, MST  69 y.o. female  Admitting Dx: Pleural effusion  ASSESSMENT: 69 y.o. female with hx of pancreatic cancer who underweight partial pancreatectomy, splenectomy, and partial colectomy 04/08/14. Pt was having vomiting for 24 hours PTA and Carafate order now in place.  Pt seen for consult and MST. She was on Heart Healthy diet yesterday with no intakes documented and NPO since 8:20 AM today for ultrasound guided thoracentesis. Pt reports poor appetite PTA which has been ongoing since surgery 04/08/14. She states UBW of 225 lbs and that she last weighed this before date of surgery. This is not consistent with chart review. Per review, pt weighed 202 lbs on 04/01/14 which indicates 7 lbs weight loss (3.4% body weight) in approximately 1 month. Pt states she was receiving Ensure Enlive on the 5th floor after surgery but that it "did not agree" with her stomach and she does not want it this admission. Will trial [employer] for tolerance and acceptance.  Pt unable to meet needs currently. She requests physical assessment not be done at this time. Will need to do physical assessment at follow-up if pt agreeable at that time.  Labs reviewed; K: 3.3 mmol/L, creatinine elevated and GFR:  39.  Height: Ht Readings from Last 1 Encounters:  05/09/14 5' 9.5" (1.765 m)    Weight: Wt Readings from Last 1 Encounters:  05/09/14 195 lb 1.7 oz (88.5 kg)    Ideal Body Weight: 147.5 lbs (67 kg) % Ideal Body Weight: 132%  Wt Readings from Last 10 Encounters:  05/09/14 195 lb 1.7 oz (88.5 kg)  05/06/14 201 lb (91.173 kg)  05/05/14 204 lb 12 oz (92.874 kg)  05/04/14 204 lb 12.9 oz (92.9 kg)  04/01/14 202 lb (91.627 kg)  03/22/14 210 lb 6.4 oz (95.437 kg)  03/19/14 212 lb 12.8 oz (96.525 kg)  02/26/14 216 lb (97.977 kg)  02/16/14 216 lb 12.8 oz (98.34 kg)  01/04/14 218 lb (98.884 kg)    Usual Body Weight: pt reports UBW of 225 lbs (102.27 kg)  % Usual Body Weight: 87%  BMI:  Body mass index is 28.41 kg/(m^2).  Estimated Nutritional Needs: Kcal: 1700-2000  Protein: 130-150 grams given cancer and recent surgery Fluid: >2L/day  Skin: documented as WDL  Diet Order: Diet NPO time specified Except for: Sips with Meds  EDUCATION NEEDS: -No education needs identified at this time   Intake/Output Summary (Last 24 hours) at 05/10/14 1454 Last data filed at 05/10/14 1417  Gross per 24 hour  Intake 1243.63 ml  Output    483 ml  Net 760.63 ml    Last BM: PTA   Labs:   Recent Labs Lab 05/04/14 0415 05/09/14 0721 05/10/14 0350  NA 139 141 139  K 3.6 3.7 3.3*  CL 105 102 103  CO2 26  --  24  BUN 19 6  15  CREATININE 1.54* 1.10 1.37*  CALCIUM 8.6  --  8.7  PHOS 3.7  --   --   GLUCOSE 95 140* 111*    CBG (last 3)  No results for input(s): GLUCAP in the last 72 hours.  Scheduled Meds: . diltiazem  30 mg Oral 4 times per day  . doxycycline  100 mg Oral BID  . latanoprost  1 drop Both Eyes QHS  . pantoprazole  40 mg Oral BID AC  . primidone  50 mg Oral QHS  . sodium chloride  3 mL Intravenous Q12H  . sucralfate  1 g Oral TID WC & HS  . tamsulosin  0.4 mg Oral Daily    Continuous Infusions: . sodium chloride 75 mL/hr at 05/10/14 0818  . heparin  1,200 Units/hr (05/10/14 1422)    Past Medical History  Diagnosis Date  . Glaucoma   . Clotting disorder   . DVT (deep venous thrombosis)     LEFT  . GERD (gastroesophageal reflux disease)   . Hypercholesterolemia   . Peripheral neuropathy   . Essential tremor   . Diverticulosis   . Arthritis     knees and back  . Cancer     Pancreatic  . Hypertension   . Colon polyps   . UTI (lower urinary tract infection)   . Complication of anesthesia     slow to wak up   . Pulmonary embolism     Past Surgical History  Procedure Laterality Date  . Cholecystectomy    . Abdominal hysterectomy    . Knee surgery Bilateral   . [place] filter    . Back surgery    . Eus N/A 02/26/2014    Procedure: UPPER ENDOSCOPIC ULTRASOUND (EUS) LINEAR;  Surgeon: Beryle Beams, MD;  Location: [facility];  Service: Endoscopy;  Laterality: N/A;  . Fine needle aspiration N/A 02/26/2014    Procedure: FINE NEEDLE ASPIRATION (FNA) LINEAR;  Surgeon: Beryle Beams, MD;  Location: [facility];  Service: Endoscopy;  Laterality: N/A;  . Breast lumpectomy Right   . Spine surgery    . Xi robotic assisted laparoscopic distal pancreatectomy N/A 04/08/2014    Procedure: XI ROBOTIC ASSISTED CONVERTED LAPAROSCOPIC HAND ASSITED DISTAL PANCREATECTOMY/SPLENECTOMY WITH PARTIAL COLECTOMY;  Surgeon: [name], MD;  Location: [facility];  Service: General;  Laterality: N/A;  . Colon surgery    . Diagnostic laparoscopy      Jarome Matin, RD, LDN Inpatient Clinical Dietitian Pager # [phone] After hours/weekend pager # [phone]

## 2014-05-10 NOTE — Progress Notes (Signed)
CARE MANAGEMENT NOTE 05/10/2014  Patient:  RASHADA, KLONTZ A   Account Number:  [id number]  Date Initiated:  05/10/2014  Documentation initiated by:  Shondra Capps  Subjective/Objective Assessment:   patient with sepsis and hypotensive     Action/Plan:   home when stable   Anticipated DC Date:  05/13/2014   Anticipated DC Plan:  HOME/SELF CARE  In-house referral  NA      DC Planning Services  CM consult      Choice offered to / List presented to:             Status of service:  In process, will continue to follow Medicare Important Message given?   (If response is "NO", the following Medicare IM given date fields will be blank) Date Medicare IM given:   Medicare IM given by:   Date Additional Medicare IM given:   Additional Medicare IM given by:    Discharge Disposition:    Per UR Regulation:  Reviewed for med. necessity/level of care/duration of stay  If discussed at [place] of Stay Meetings, dates discussed:    Comments:  May 10, 2014/Embry Huss L. Rosana Hoes, RN, BSN, CCM. Case Management [place] [phone] No discharge needs present of time of review.

## 2014-05-10 NOTE — Progress Notes (Deleted)
ANTIBIOTIC CONSULT NOTE - INITIAL  Pharmacy Consult for Zosyn, Cipro Indication: empyema  Allergies  Allergen Reactions  . Sulfa Antibiotics Swelling    Patient Measurements: Height: 5' 9.5" (176.5 cm) Weight: 195 lb 1.7 oz (88.5 kg) IBW/kg (Calculated) : 67.35  Vital Signs: Temp: 98.9 F (37.2 C) (04/11 1600) Temp Source: Oral (04/11 1600) BP: 103/54 mmHg (04/11 1711) Pulse Rate: 109 (04/11 1400) Intake/Output from previous day: 04/10 0701 - 04/11 0700 In: 736 [P.O.:180; I.V.:303; IV Piggyback:250] Out: 813 [Urine:780; Drains:33] Intake/Output from this shift: Total I/O In: 510.6 [I.V.:500.6; Other:10] Out: 40 [Urine:40]  Labs:  Recent Labs  05/09/14 0655 05/09/14 0721 05/10/14 0350  WBC 12.5*  --  15.4*  HGB 10.3* 12.6 9.1*  PLT 476*  --  369  CREATININE  --  1.10 1.37*   Estimated Creatinine Clearance: 47.7 mL/min (by C-G formula based on Cr of 1.37). No results for input(s): VANCOTROUGH, VANCOPEAK, VANCORANDOM, GENTTROUGH, GENTPEAK, GENTRANDOM, TOBRATROUGH, TOBRAPEAK, TOBRARND, AMIKACINPEAK, AMIKACINTROU, AMIKACIN in the last 72 hours.   Microbiology: Recent Results (from the past 720 hour(s))  Culture, routine-abscess     Status: None   Collection Time: 04/16/14  3:41 PM  Result Value Ref Range Status   Specimen Description DRAINAGE  Final   Special Requests Normal  Final   Gram Stain   Final    NO WBC SEEN NO SQUAMOUS EPITHELIAL CELLS SEEN NO ORGANISMS SEEN Performed at [facility]    Culture   Final    FEW PSEUDOMONAS AERUGINOSA Performed at [facility]    Report Status 04/20/2014 FINAL  Final   Organism ID, Bacteria PSEUDOMONAS AERUGINOSA  Final      Susceptibility   Pseudomonas aeruginosa - MIC*    CEFEPIME <=1 SENSITIVE Sensitive     CEFTAZIDIME 2 SENSITIVE Sensitive     CIPROFLOXACIN <=0.25 SENSITIVE Sensitive     GENTAMICIN <=1 SENSITIVE Sensitive     IMIPENEM 1 SENSITIVE Sensitive     PIP/TAZO 8 SENSITIVE Sensitive      TOBRAMYCIN <=1 SENSITIVE Sensitive     * FEW PSEUDOMONAS AERUGINOSA  Clostridium Difficile by PCR     Status: None   Collection Time: 04/25/14  1:53 AM  Result Value Ref Range Status   C difficile by pcr NEGATIVE NEGATIVE Final  MRSA PCR Screening     Status: None   Collection Time: 05/09/14 12:27 PM  Result Value Ref Range Status   MRSA by PCR NEGATIVE NEGATIVE Final    Comment:        The GeneXpert MRSA Assay (FDA approved for NASAL specimens only), is one component of a comprehensive MRSA colonization surveillance program. It is not intended to diagnose MRSA infection nor to guide or monitor treatment for MRSA infections.     Medical History: Past Medical History  Diagnosis Date  . Glaucoma   . Clotting disorder   . DVT (deep venous thrombosis)     LEFT  . GERD (gastroesophageal reflux disease)   . Hypercholesterolemia   . Peripheral neuropathy   . Essential tremor   . Diverticulosis   . Arthritis     knees and back  . Cancer     Pancreatic  . Hypertension   . Colon polyps   . UTI (lower urinary tract infection)   . Complication of anesthesia     slow to wak up   . Pulmonary embolism     Assessment: 3 yoF with recently diagnosed adenocarcinoma of the pancreas status post distal  pancreatectomy, splenectomy and partial colectomy on 04/08/2014 with subsequent LUQ abscess- 2 drains still present in left abdomen and chest.  Pleural fluid reveals about 9000 WBCs 90% neutrophils- possibly an extension of abdominal abscess as she has no pneumonia - abscess culture grew pseudomonas previously.  Pharmacy consulted to dose Zosyn and Cipro for empyema.  Await culture results.  4/11 >> Doxycycline >> (4/15) 4/11 >> Zosyn >> 4/11 >> Cipro >>  Tmax: 99.9 WBC: elevated Renal: SCr elevated at 1.37, CrCl~55 ml/min  Goal of Therapy:  Doses adjusted per renal function Eradication of infection  Plan:  1.  Zosyn 3.375g IV q8h (4 hour infusion time). 2.  Cipro 400 mg  IV q12h.  Hershal Coria 05/10/2014,5:44 PM

## 2014-05-11 ENCOUNTER — Inpatient Hospital Stay (HOSPITAL_COMMUNITY): Payer: Medicare Other

## 2014-05-11 LAB — BASIC METABOLIC PANEL
Anion gap: 10 (ref 5–15)
BUN: 18 mg/dL (ref 6–23)
CALCIUM: 8.5 mg/dL (ref 8.4–10.5)
CO2: 22 mmol/L (ref 19–32)
Chloride: 107 mmol/L (ref 96–112)
Creatinine, Ser: 1.42 mg/dL — ABNORMAL HIGH (ref 0.50–1.10)
GFR calc Af Amer: 43 mL/min — ABNORMAL LOW (ref >90)
GFR calc non Af Amer: 37 mL/min — ABNORMAL LOW (ref >90)
Glucose, Bld: 115 mg/dL — ABNORMAL HIGH (ref 70–99)
POTASSIUM: 3.6 mmol/L (ref 3.5–5.1)
Sodium: 139 mmol/L (ref 135–145)

## 2014-05-11 LAB — HEPARIN LEVEL (UNFRACTIONATED): Heparin Unfractionated: 0.35 IU/mL (ref 0.30–0.70)

## 2014-05-11 LAB — CBC
HCT: 29.8 % — ABNORMAL LOW (ref 36.0–46.0)
Hemoglobin: 9.1 g/dL — ABNORMAL LOW (ref 12.0–15.0)
MCH: 29.4 pg (ref 26.0–34.0)
MCHC: 30.5 g/dL (ref 30.0–36.0)
MCV: 96.4 fL (ref 78.0–100.0)
PLATELETS: 355 10*3/uL (ref 150–400)
RBC: 3.09 MIL/uL — AB (ref 3.87–5.11)
RDW: 16.5 % — ABNORMAL HIGH (ref 11.5–15.5)
WBC: 11.2 10*3/uL — AB (ref 4.0–10.5)

## 2014-05-11 LAB — AMYLASE, PLEURAL FLUID: Amylase, Pleural Fluid: 55 U/L

## 2014-05-11 MED ORDER — IOHEXOL 300 MG/ML  SOLN
25.0000 mL | INTRAMUSCULAR | Status: DC
  Administered 2014-05-11: 25 mL via ORAL

## 2014-05-11 NOTE — Progress Notes (Signed)
Patient ID: [name], female   DOB: Apr 27, 1945, 69 y.o.   MRN: [id number]    Referring Physician(s): CCS  Subjective:  Patient still having some left upper quadrant and some left shoulder discomfort; breathing has improved since thoracentesis 4/11  Allergies: Sulfa antibiotics  Medications: Prior to Admission medications   Medication Sig Start Date End Date Taking? Authorizing Provider  atorvastatin (LIPITOR) 40 MG tablet Take 1 tablet (40 mg total) by mouth daily. 05/05/14  Yes Gerlene Fee, NP  diltiazem (CARTIA XT) 120 MG 24 hr capsule Take 1 capsule (120 mg total) by mouth daily. 05/05/14  Yes Gerlene Fee, NP  DOXYCYCLINE HYCLATE PO Take 100 mg by mouth 2 (two) times daily.   Yes Historical Provider, MD  enoxaparin (LOVENOX) 120 MG/0.8ML injection Inject 0.8 mLs (120 mg total) into the skin daily. 05/06/14  Yes Truitt Merle, MD  latanoprost (XALATAN) 0.005 % ophthalmic solution Place 1 drop into both eyes at bedtime.   Yes Historical Provider, MD  pantoprazole (PROTONIX) 40 MG tablet TAKE 1 TABLET BY MOUTH DAILY 03/28/14  Yes Leandrew Koyanagi, MD  primidone (MYSOLINE) 50 MG tablet Take 50 mg by mouth at bedtime. Patient takes at nite   Yes Historical Provider, MD  propranolol ER (INDERAL LA) 120 MG 24 hr capsule TAKE ONE CAPSULE BY MOUTH DAILY 03/28/14  Yes Mancel Bale, PA-C  sucralfate (CARAFATE) 1 GM/10ML suspension Take 10 mLs (1 g total) by mouth 4 (four) times daily -  with meals and at bedtime. 05/04/14  Yes [name], MD  tamsulosin (FLOMAX) 0.4 MG CAPS capsule Take 1 capsule (0.4 mg total) by mouth daily. 05/04/14  Yes [name], MD  traMADol (ULTRAM) 50 MG tablet Take 1 tablet (50 mg total) by mouth every 6 (six) hours as needed. 05/04/14  Yes [name], MD  loperamide (IMODIUM) 2 MG capsule Take 1 capsule (2 mg total) by mouth 3 (three) times daily before meals. 05/04/14   [name], MD     Vital Signs: BP 115/53 mmHg  Pulse 101  Temp(Src) 98.8 F (37.1 C)  (Oral)  Resp 11  Ht 5' 9.5" (1.765 m)  Wt 199 lb 11.8 oz (90.6 kg)  BMI 29.08 kg/m2  SpO2 96%  Physical Exam  patient awake, alert; left upper quadrant drain intact, insertion site clean and dry, mildly moderately tender to palpation, output about 40 mL; drain irrigated with 10 mL normal saline with return of same amount.  Imaging: Dg Chest 1 View  05/10/2014   CLINICAL DATA:  Status post thoracentesis  EXAM: CHEST  1 VIEW  COMPARISON:  Chest radiograph and chest CT May 09, 2014  FINDINGS: No pneumothorax. Left effusion smaller following thoracentesis. There is some residual left effusion with left base atelectasis. There is a chest tube in the left subpleural region as well as a pigtail catheter in the left upper abdomen.  Right lung is clear. Heart is mildly enlarged with pulmonary vascularity, stable.  IMPRESSION: Left effusion smaller following thoracentesis. There is a small left effusion with left base atelectasis. Subpulmonic chest tube remains on the left as does a drain in the upper abdomen on the left. Right lung is clear. No change in cardiac silhouette. No demonstrable pneumothorax.   Electronically Signed   By: Lowella Grip III M.D.   On: 05/10/2014 14:13   Dg Chest 2 View  05/09/2014   CLINICAL DATA:  69 year old female with shortness of breath and chest pain. Initial encounter.  Personal history of distal pancreatectomy and splenectomy.  EXAM: CHEST  2 VIEW  COMPARISON:  Chest and abdominal series 4 /09/2014. CT Abdomen and Pelvis 04/29/2014, portable chest 04/17/2014.  FINDINGS: Semi upright AP and lateral views of the chest. Left pleural effusion has progressed and is moderate to large. Increased opacification at the left lung base. Lower lung volumes.  Stable cardiac size and mediastinal contours. No pneumothorax or pulmonary edema. Grossly clear right lung. Visualized tracheal air column is within normal limits.  Left abdomen percutaneous drains remain in place. Partially  visible IVC filter.  IMPRESSION: 1. Progressed left pleural effusion, now moderate to large. Associated decreased ventilation in the left lung. 2. Lower lung volumes.   Electronically Signed   By: Genevie Ann M.D.   On: 05/09/2014 08:49   Ct Angio Chest Pe W/cm &/or Wo Cm  05/09/2014   CLINICAL DATA:  Shortness of breath, chest pain.  EXAM: CT ANGIOGRAPHY CHEST WITH CONTRAST  TECHNIQUE: Multidetector CT imaging of the chest was performed using the standard protocol during bolus administration of intravenous contrast. Multiplanar CT image reconstructions and MIPs were obtained to evaluate the vascular anatomy.  CONTRAST:  133mL OMNIPAQUE IOHEXOL 350 MG/ML SOLN  COMPARISON:  CT scan of April 29, 2014.  FINDINGS: No pneumothorax is noted. Moderate left pleural effusion is noted with associated atelectasis of the left lower lobe. Right lung is clear. There is no evidence of thoracic aortic dissection or aneurysm. There is no definite evidence of pulmonary embolus. No mediastinal mass or adenopathy is noted. No significant osseous abnormality is noted. Continued presence of drainage catheter seen between the stomach and left kidney. Continued presence of fluid collection seen medial to proximal stomach.  Review of the MIP images confirms the above findings.  IMPRESSION: No evidence of pulmonary embolus.  Moderate left pleural effusion is noted with associated atelectasis of the left lower lobe.  Continued presence of drainage catheter seen between the stomach and left kidney, as well as continued and stable appearance of fluid collection medial to proximal stomach. Please refer to report of CT scan of April 29, 2014.   Electronically Signed   By: Marijo Conception, M.D.   On: 05/09/2014 16:56   Dg Abd Acute W/chest  05/08/2014   CLINICAL DATA:  Left upper quadrant and left lower quadrant abdominal pain.  EXAM: DG ABDOMEN ACUTE W/ 1V CHEST  COMPARISON:  04/29/2014  FINDINGS: Two surgical drains are identified within the  left upper quadrant of the abdomen. These are similar in position to 3/30 1/16. There is an IVC filter in place and there are surgical clips from prior cholecystectomy. There is no evidence of dilated bowel loops or free intraperitoneal air. No radiopaque calculi or other significant radiographic abnormality is seen. Heart size and mediastinal contours are within normal limits. Both lungs are clear. Moderate left pleural effusion is again noted.  IMPRESSION: 1. Nonobstructive bowel gas pattern. 2. Left pleural effusion 3. Similar position of left upper quadrant surgical drains.   Electronically Signed   By: Kerby Moors M.D.   On: 05/08/2014 18:27   US Thoracentesis Asp Pleural Space W/img Guide  05/10/2014   INDICATION: Symptomatic left sided pleural effusion  EXAM: US THORACENTESIS ASP PLEURAL SPACE W/IMG GUIDE  COMPARISON:  CXR 05/09/14.  MEDICATIONS: None  COMPLICATIONS: None immediate  TECHNIQUE: Informed written consent was obtained from the patient after a discussion of the risks, benefits and alternatives to treatment. A timeout was performed prior to the initiation  of the procedure.  Initial ultrasound scanning demonstrates a left pleural effusion. The lower chest was prepped and draped in the usual sterile fashion. 1% lidocaine was used for local anesthesia.  Under direct ultrasound guidance, a 19 gauge, 10-cm, Yueh catheter was introduced. An ultrasound image was saved for documentation purposes. The thoracentesis was performed. The catheter was removed and a dressing was applied. The patient tolerated the procedure well without immediate post procedural complication. The patient was escorted to have an upright chest radiograph.  FINDINGS: A total of approximately 500 ml of blood tinged fluid was removed. Requested samples were sent to the laboratory.  IMPRESSION: Successful ultrasound-guided left sided thoracentesis yielding 500 ml of pleural fluid.  Read By:  Tsosie Billing PA-C   Electronically  Signed   By: Sandi Mariscal M.D.   On: 05/10/2014 14:14    Labs:  CBC:  Recent Labs  05/04/14 0415 05/09/14 0655 05/09/14 0721 05/10/14 0350 05/11/14 0345  WBC 8.3 12.5*  --  15.4* 11.2*  HGB 8.1* 10.3* 12.6 9.1* 9.1*  HCT 25.9* 34.2* 37.0 29.2* 29.8*  PLT 476* 476*  --  369 355    COAGS:  Recent Labs  04/01/14 1400 04/08/14 0630 04/09/14 0404 05/09/14 0655  INR 1.11 1.06 1.12 1.08  APTT  --  40* 33  --     BMP:  Recent Labs  05/03/14 0440 05/04/14 0415 05/09/14 0721 05/10/14 0350 05/11/14 0345  NA 139 139 141 139 139  K 3.7 3.6 3.7 3.3* 3.6  CL 106 105 102 103 107  CO2 24 26  --  24 22  GLUCOSE 98 95 140* 111* 115*  BUN 22 19 6 15 18   CALCIUM 8.6 8.6  --  8.7 8.5  CREATININE 1.56* 1.54* 1.10 1.37* 1.42*  GFRNONAA 33* 34*  --  39* 37*  GFRAA 39* 39*  --  45* 43*    LIVER FUNCTION TESTS:  Recent Labs  04/01/14 1400 04/24/14 2039  04/30/14 0450  05/02/14 0615 05/03/14 0440 05/04/14 0415 05/09/14 0655 05/10/14 0350  BILITOT 0.7 0.5  --  0.3  --   --   --   --  0.5  --   AST 35 30  --  29  --   --   --   --  38*  --   ALT 21 13  --  10  --   --   --   --  17  --   ALKPHOS 75 83  --  65  --   --   --   --  78  --   PROT 7.6 6.1  --  6.0  --   --   --   --  7.6 6.7  ALBUMIN 3.9 2.4*  < > 3.0*  < > 3.2* 3.1* 3.2* 3.8  --   < > = values in this interval not displayed.  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    Signed: Autumn Messing 05/11/2014, 1:16 PM   I spent a total of 15 minutes in face to face in clinical consultation/evaluation, greater than 50% of which was counseling/coordinating care for left upper quadrant abscess drainage

## 2014-05-11 NOTE — Progress Notes (Signed)
Clinical Social Work Department BRIEF PSYCHOSOCIAL ASSESSMENT 05/11/2014  Patient:  [name], [name]     Account Number:  [id number]     [place] date:  05/09/2014  Clinical Social Worker:  Maryln Manuel  Date/Time:  05/11/2014 11:45 AM  Referred by:  Physician  Date Referred:  05/11/2014 Referred for  SNF Placement   Other Referral:   Interview type:  Patient Other interview type:   and patient daughter at bedside    PSYCHOSOCIAL DATA Living Status:  FACILITY Admitted from facility:  [place] [place] Level of care:  [place] Primary support name:  Verbena Sinnett/daughter/[phone] Primary support relationship to patient:  CHILD, ADULT Degree of support available:   adequate    CURRENT CONCERNS Current Concerns  Post-Acute Placement   Other Concerns:    SOCIAL WORK ASSESSMENT / PLAN CSW received referral that pt admitted from [facility].    CSW met with pt and pt daughter, Carrington at bedside. CSW introduced self and explained role. Pt confirmed that she admitted from [facility]. Pt daughter explained that pt just went to [place] last Tuesday following hospitalization, but was readmitted to hospital on Saturday. CSW explored with pt and pt daughter feeling surrounding [facility] and if plan would be to return to facility. Pt daughter expressed that pt was interested in exploring other options. Pt daughter discussed that her main concern at [facility] has been the facilities management of pt JP drains and foley. Pt daughter reports that she did not feel that the facility was appropriately caring for the drain and states that the facility informed her that they only changed foley bag when it was full and pt daughter feels that this is not appropriate care because there are times that it can take two days for pt foley bag to be full. CSW expressed  understanding and provided support. CSW discussed with pt and pt daughter that CSW can initiate SNF search to [facility] in order to have other options available.CSW also inquired with pt and pt daughter if they would be willing to speak with RN liaison from [facility] to discuss concerns and they are agreeable. CSW discussed that CSW will begin search and also notify [facility] RN liaison in order for the liaison to be in touch with pt and pt daughter regarding concerns.    CSW completed FL2 and initiated SNF search to [place] at this time. CSW to follow up with pt and pt daughter re: SNF bed offers.    CSW contacted [facility] RN liaison, Gayla Medicus and notified her of the concerns that the pt and pt daughter expressed and informed her that pt and pt daughter are willing to discuss concerns with her, but did express wanting to explore other options.    CSW to continue to follow to provide support and assist with pt disposition needs.   Assessment/plan status:  Psychosocial Support/Ongoing Assessment of Needs Other assessment/ plan:   discharge planning   Information/referral to community resources:   [facility] list    PATIENT'S/FAMILY'S RESPONSE TO PLAN OF CARE: Pt alert and oriented x 4. Pt very quiet during assessment and pt daughter mainly provided information surrounding pt placement. Pt did acknowledge that she wishes to explore other options for rehab facilities. Support provided.    Alison Murray, MSW, [place] Work [phone]

## 2014-05-11 NOTE — Progress Notes (Signed)
PULMONARY / CRITICAL CARE MEDICINE   Name: [name] MRN: [id number] DOB: 1945/10/05    ADMISSION DATE:  05/09/2014 CONSULTATION DATE:  05/09/14  REFERRING MD :  Rizwan/ [facility]   CHIEF COMPLAINT:  New L CP  INITIAL PRESENTATION: 36 yobf recovering from extensive surgery for pancreatic Ca with multiple drains in place for LUQ abscess post op but tapered opioids down to one daily abruptly worse with R Upper/ Lat CP > ER am 4/10 with worsening L lung aeration ? Effusion > admitted for further eval and PCCM service asked to see pma 4.10   past medical history of pancreatic cancer status post distal pancreatectomy, splenectomy and partial colectomy on 04/08/2014. She has a history of DVT 10 years ago and was placed on Coumadin and had an IVC filter placed. She was found in the fall of 2015 to have an acute DVT in the left leg also a thrombus involving the IVC filter. Patient is on Lovenox (Dr. Burr Medico) & needs anticoagulation indefinitely. Other past medical history includes peripheral neuropathy, essential tremor, "fast heart rate" on Inderal, hyperlipidemia.   STUDIES: CTa chest 4/10 >> no PE, Moderate left pleural effusion , drainage catheter seen between the stomach and left kidney, stable appearance of fluid collection medial to proximal stomach Compared to 3/31   SIGNIFICANT EVENTS: 4/11 thora >> 500 cc blood tinged >>exudate, neutrophilic       SUBJECTIVE:  Low gr fever Continues to have left chest pain Uneventful thora  VITAL SIGNS: Temp:  [98.4 F (36.9 C)-100 F (37.8 C)] 98.8 F (37.1 C) (04/12 0800) Pulse Rate:  [94-109] 94 (04/12 0800) Resp:  [13-18] 14 (04/12 0900) BP: (103-128)/(47-60) 127/55 mmHg (04/12 0849) SpO2:  [93 %-95 %] 95 % (04/12 0900) Weight:  [90.6 kg (199 lb 11.8 oz)] 90.6 kg (199 lb 11.8 oz) (04/12 0500) HEMODYNAMICS:   VENTILATOR SETTINGS:   INTAKE / OUTPUT:  Intake/Output Summary (Last 24 hours) at 05/11/14 0929 Last data filed at 05/11/14  0900  Gross per 24 hour  Intake 2123.6 ml  Output    575 ml  Net 1548.6 ml    PHYSICAL EXAMINATION: General: chronically > acutely ill, not toxic  Pt alert, approp nad   No jvd Oropharynx clear Neck supple Lungs  decreaed bs L base, no rhonchi RRR no s3 or or sign murmur Abd obese with poor excursion, 2 drains and diffuse mild tenderness, no rebound  Ext wam with no edema or clubbing noted Neuro  No motor deficits   LABS:  CBC  Recent Labs Lab 05/09/14 0655 05/09/14 0721 05/10/14 0350 05/11/14 0345  WBC 12.5*  --  15.4* 11.2*  HGB 10.3* 12.6 9.1* 9.1*  HCT 34.2* 37.0 29.2* 29.8*  PLT 476*  --  369 355   Coag's  Recent Labs Lab 05/09/14 0655  INR 1.08   BMET  Recent Labs Lab 05/09/14 0721 05/10/14 0350 05/11/14 0345  NA 141 139 139  K 3.7 3.3* 3.6  CL 102 103 107  CO2  --  24 22  BUN 6 15 18   CREATININE 1.10 1.37* 1.42*  GLUCOSE 140* 111* 115*   Electrolytes  Recent Labs Lab 05/10/14 0350 05/11/14 0345  CALCIUM 8.7 8.5   Sepsis Markers No results for input(s): LATICACIDVEN, PROCALCITON, O2SATVEN in the last 168 hours. ABG No results for input(s): PHART, PCO2ART, PO2ART in the last 168 hours. Liver Enzymes  Recent Labs Lab 05/09/14 0655  AST 38*  ALT 17  ALKPHOS 78  BILITOT  0.5  ALBUMIN 3.8   Cardiac Enzymes No results for input(s): TROPONINI, PROBNP in the last 168 hours. Glucose No results for input(s): GLUCAP in the last 168 hours.  Imaging Dg Chest 1 View  05/10/2014   CLINICAL DATA:  Status post thoracentesis  EXAM: CHEST  1 VIEW  COMPARISON:  Chest radiograph and chest CT May 09, 2014  FINDINGS: No pneumothorax. Left effusion smaller following thoracentesis. There is some residual left effusion with left base atelectasis. There is a chest tube in the left subpleural region as well as a pigtail catheter in the left upper abdomen.  Right lung is clear. Heart is mildly enlarged with pulmonary vascularity, stable.  IMPRESSION:  Left effusion smaller following thoracentesis. There is a small left effusion with left base atelectasis. Subpulmonic chest tube remains on the left as does a drain in the upper abdomen on the left. Right lung is clear. No change in cardiac silhouette. No demonstrable pneumothorax.   Electronically Signed   By: Lowella Grip III M.D.   On: 05/10/2014 14:13   US Thoracentesis Asp Pleural Space W/img Guide  05/10/2014   INDICATION: Symptomatic left sided pleural effusion  EXAM: US THORACENTESIS ASP PLEURAL SPACE W/IMG GUIDE  COMPARISON:  CXR 05/09/14.  MEDICATIONS: None  COMPLICATIONS: None immediate  TECHNIQUE: Informed written consent was obtained from the patient after a discussion of the risks, benefits and alternatives to treatment. A timeout was performed prior to the initiation of the procedure.  Initial ultrasound scanning demonstrates a left pleural effusion. The lower chest was prepped and draped in the usual sterile fashion. 1% lidocaine was used for local anesthesia.  Under direct ultrasound guidance, a 19 gauge, 10-cm, Yueh catheter was introduced. An ultrasound image was saved for documentation purposes. The thoracentesis was performed. The catheter was removed and a dressing was applied. The patient tolerated the procedure well without immediate post procedural complication. The patient was escorted to have an upright chest radiograph.  FINDINGS: A total of approximately 500 ml of blood tinged fluid was removed. Requested samples were sent to the laboratory.  IMPRESSION: Successful ultrasound-guided left sided thoracentesis yielding 500 ml of pleural fluid.  Read By:  Tsosie Billing PA-C   Electronically Signed   By: Sandi Mariscal M.D.   On: 05/10/2014 14:14     ASSESSMENT / PLAN:  1)  Acute onset L cp new pattern from her LUQ pain post op assoc with poor aeration L base and ? Worse effusion - main issue is pain control, not hypoxemia or wob   ddx =  sympathetic  effusion/parapneumonicrelated to recent LUQ abscess, atx.  Malignancy less likely but not excluded entirely. Hemothorax seems much less likely  Prior DVT s/p IVC filter AKI  Rec:  Resumed IV Hep  D/w Dr Wynelle Cleveland - restart zosyn/cipro empiric for pseudomonas -isolated on prior admit from abd abscess -await pl fluid culture Agree with CT abdomen once cr improves  Discussed with pt and daughter    Kara Mead MD. [facility]. [place] [facility] Pager [phone] If no response call [phone]

## 2014-05-11 NOTE — Progress Notes (Addendum)
Patient ID: [name], female   DOB: 1945/05/25, 69 y.o.   MRN: [id number]  [facility] PROGRESS NOTE  [name] RWE:[id number] DOB: [birth year] DOA: 05/09/2014 PCP: Mauricio Po, FNP   Brief narrative:    68 y.o. 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 She has followed with Dr Burr Medico with plan is to start chemo soon. Pt presented with left sided chest pain and upper abd pain and was found to have moderate sized pleural effusion requiring IR guided left thoracentesis 4/11 with removal of 500 cc of fluid.   Assessment/Plan:    Principal Problem:   Left chest/ abdominal pain - partly due to drains and possibly due to effusion, ? parapneumonic effusion related to recent LUQ abscess  - now status post left thoracentesis 4/11 with 500 cc fluid removed, follow up on analysis  - appreciate IR and PCCM input - continue current ABX Zosyn and Cipro to cover for pseudomonas    Moderate to large acute left pleural effusion - unclear etiology at this time - malignancy still not excluded but possibly related to extension of teh abd abscess   LUQ abscess with recent cultures growing Pseudomonas - status post drainage on 3/18 - WBC continue trending down  - repeat CT abd with decreasing size of the abscess  - continuing Zosyn and Cipro    Slight volume overload - not sure she needs more IVF - weight is up since admission from 190 to 195 lbs this AM - will lowe the rate form 75 cc to 30 cc/hr and possible to stop in AM - monitor weight and strict I's and O's   Prior DVT s/p IVC filter - continuing heparin drip - on Lovenox at home but was held due to thoracentesis - can possibly resume Lovenox  in AM   Acute renal failure - appears to be pre renal  - will continue to monitor closely and repeat BMP in AM   Anemia of acute illness imposed on anemia of chronic disease, malignancy - no signs of active bleeding - repeat CBC in AM   Sepsis secondary to the above, L pleural effusion, ? parapneumonic - on 4/11 - WBC 15 K, T 100F, HR in 110 with BP 100/33 - continue ABX as noted above and repeat CBC in AM   Pancreatic cancer of tail, s/p distal pancreatectomy, splenectomy, & partial colectomy 04/08/2014 - to follow up with Dr. Barry Dienes and Dr. Burr Medico, will schedule the appointments    Severe PCM - in the context of acute illness - nutritionist consulted    Urinary retention - despite multiple voiding trails after surgery, she continued to retain urine - foley was continued - has been on Flomax since last admission - recommend another voiding trial when stable   Tremors   - on Primidone   Acute functional quadriplegia - in the setting of acute illness - will need PT evaluation   DVT prophylaxis - on Heparin drip   Code Status: Full.  Family Communication:  plan of care discussed with the patient Disposition Plan: Keep in SDU  IV access:  Peripheral IV  Procedures and diagnostic studies:    Ct Abdomen Pelvis Wo Contrast  05/11/2014   Two left upper  quadrant surgical drains remain in place and unchanged in position. No significant fluid collection within the left upper quadrant.  Slight interval decrease in size of a fluid collection over the surgical bed adjacent the distal pancreatectomy measuring 2.1 x 2.7 cm (previously 2.7 x 3 cm).  Interval improvement small amount of free pelvic fluid.  Stable stranding of the mesenteric fat adjacent the colonic anastomosis in the left mid abdomen likely postsurgical change.  Other stable chronic changes within the abdomen.  Continued moderate left pleural effusion with associated compressive atelectasis. Tiny right pleural fluid collection with  associated atelectasis.      Dg Chest 1 View   05/10/2014   Left effusion smaller following thoracentesis. There is a small left effusion with left base atelectasis. Subpulmonic chest tube remains on the left as does a drain in the upper abdomen on the left. Right lung is clear. No change in cardiac silhouette. No demonstrable pneumothorax.     Dg Chest 2 View  05/09/2014   Progressed left pleural effusion, now moderate to large. Associated decreased ventilation in the left lung. 2. Lower lung volumes.     Ct Angio Chest Pe W/cm &/or Wo Cm  05/09/2014   No evidence of pulmonary embolus.  Moderate left pleural effusion is noted with associated atelectasis of the left lower lobe.  Continued presence of drainage catheter seen between the stomach and left kidney, as well as continued and stable appearance of fluid collection medial to proximal stomach.   Dg Abd Acute W/chest  05/08/2014  Nonobstructive bowel gas pattern. Left pleural effusion  Similar position of left upper quadrant surgical drains.    US Thoracentesis Asp Pleural Space W/img Guide 05/10/2014  Successful ultrasound-guided left sided thoracentesis yielding 500 ml of pleural fluid.    Medical Consultants:  PCCM IR  Other Consultants:  PT/OT  IAnti-Infectives:   Zosyn 4/10 --> Cipro 4/10 -->  Faye Ramsay, MD  [facility] Pager [phone]  If 7PM-7AM, please contact night-coverage [email or web address] Password [facility] 05/11/2014, 5:58 PM   LOS: 2 days   HPI/Subjective: Pt still with abd pain and left sided chest pain, says its difficult to move to he the left side.   Objective: Filed Vitals:   05/11/14 1000 05/11/14 1200 05/11/14 1400 05/11/14 1600  BP: 106/51 115/53 111/44 116/76  Pulse: 99 101 107 104  Temp:  98.6 F (37 C)  99.2 F (37.3 C)  TempSrc:  Oral  Oral  Resp: 10 11 14 18   Height:      Weight:      SpO2: 96% 96% 96% 97%    Intake/Output Summary (Last 24 hours) at 05/11/14 1758 Last data filed at 05/11/14 1756   Gross per 24 hour  Intake 2575.6 ml  Output    775 ml  Net 1800.6 ml    Exam:   General:  Pt is tired and frail appearing, NAD  Cardiovascular: Regular rhythm, tachycardic, S1/S2, no rubs, no gallops  Respiratory: Diminished air movement on the left side, clearer on the right side, bibasilar crackles   Abdomen: Soft, slightly tender in epigastric are, non distended, bowel sounds present  Extremities: pulses DP and PT palpable bilaterally  Data Reviewed: Basic Metabolic Panel:  Recent Labs Lab 05/09/14 0721 05/10/14 0350 05/11/14 0345  NA 141 139 139  K 3.7 3.3* 3.6  CL 102 103 107  CO2  --  24 22  GLUCOSE 140* 111* 115*  BUN 6 15 18   CREATININE 1.10 1.37* 1.42*  CALCIUM  --  8.7 8.5   Liver Function Tests:  Recent Labs Lab 05/09/14 0655 05/10/14 0350  AST 38*  --   ALT 17  --   ALKPHOS 78  --   BILITOT 0.5  --   PROT 7.6 6.7  ALBUMIN 3.8  --     Recent Labs Lab 05/09/14 0655  LIPASE 85*   CBC:  Recent Labs Lab 05/09/14 0655 05/09/14 0721 05/10/14 0350 05/11/14 0345  WBC 12.5*  --  15.4* 11.2*  HGB 10.3* 12.6 9.1* 9.1*  HCT 34.2* 37.0 29.2* 29.8*  MCV 97.2  --  96.7 96.4  PLT 476*  --  369 355    Recent Results (from the past 240 hour(s))  MRSA PCR Screening     Status: None   Collection Time: 05/09/14 12:27 PM  Result Value Ref Range Status   MRSA by PCR NEGATIVE NEGATIVE Final  Body fluid culture     Status: None (Preliminary result)   Collection Time: 05/10/14  2:01 PM  Result Value Ref Range Status   Specimen Description PLEURAL  Final   Special Requests NONE  Final   Gram Stain   Final    FEW WBC PRESENT,BOTH PMN AND MONONUCLEAR NO ORGANISMS SEEN Performed at [facility]    Culture   Final    NO GROWTH 1 DAY Performed at [facility]    Report Status PENDING  Incomplete     Scheduled Meds: . ciprofloxacin  400 mg Intravenous Q12H  . diltiazem  30 mg Oral 4 times per day  . doxycycline  100 mg Oral BID   . feeding supplement (RESOURCE BREEZE)  1 Container Oral BID BM  . latanoprost  1 drop Both Eyes QHS  . pantoprazole  40 mg Oral BID AC  . piperacillin-tazobactam (ZOSYN)  IV  3.375 g Intravenous Q8H  . primidone  50 mg Oral QHS  . sodium chloride  3 mL Intravenous Q12H  . sucralfate  1 g Oral TID WC & HS  . tamsulosin  0.4 mg Oral Daily   Continuous Infusions: . sodium chloride 75 mL/hr (05/11/14 1252)  . heparin 1,200 Units/hr (05/11/14 1500)

## 2014-05-11 NOTE — Progress Notes (Signed)
ANTICOAGULATION CONSULT NOTE - FOLLOW UP  Pharmacy Consult for Heparin (PTA Lovenox on hold for procedure) Indication: hx DVT  Allergies  Allergen Reactions  . Sulfa Antibiotics Swelling    Patient Measurements: Height: 5' 9.5" (176.5 cm) Weight: 199 lb 11.8 oz (90.6 kg) IBW/kg (Calculated) : 67.35 Heparin Dosing Weight: 85 kg  Vital Signs: Temp: 98.8 F (37.1 C) (04/12 0800) Temp Source: Oral (04/12 0800) BP: 127/55 mmHg (04/12 0849) Pulse Rate: 94 (04/12 0800)  Labs:  Recent Labs  05/09/14 0655 05/09/14 0721 05/09/14 2056 05/10/14 0350 05/11/14 0345  HGB 10.3* 12.6  --  9.1* 9.1*  HCT 34.2* 37.0  --  29.2* 29.8*  PLT 476*  --   --  369 355  LABPROT 14.1  --   --   --   --   INR 1.08  --   --   --   --   HEPARINUNFRC  --   --  0.70 0.53 0.35  CREATININE  --  1.10  --  1.37* 1.42*    Estimated Creatinine Clearance: 46.5 mL/min (by C-G formula based on Cr of 1.42).  Assessment: 75 YOF presented 05/08/14 with lower left-sided abdominal pain s/p complex abdominal surgery (pancreas, spleen, colon, & kidney). 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 Lovenox transitioned to heparin in anticipation of thoracentesis due to chest x-ray confirmation of pleural effusion on 05/09/14. Chest CT angio on 4/10 negative for new PE. S/p thoracentesis 05/10/14.  Today, 05/11/14 Heparin level remains therapeutic at 0.35. H/H low, but stable. Hgb 9.1 and PLTs WNL. No bleeding noted.   Goal of Therapy:  Heparin level 0.3-0.7 units/ml Monitor platelets by anticoagulation protocol: Yes   Plan:  -Continue heparin drip at 1200 units/hr -Follow up daily heparin level -Monitor for sign/symptoms of bleeding  Gloriajean Dell, PharmD Candidate

## 2014-05-11 NOTE — Progress Notes (Signed)
Clinical Social Work Department CLINICAL SOCIAL WORK PLACEMENT NOTE 05/11/2014  Patient:  [name], [name]  Account Number:  [id number] [place] date:  05/09/2014  Clinical Social Worker:  Maryln Manuel  Date/time:  05/11/2014 12:15 PM  Clinical Social Work is seeking post-discharge placement for this patient at the following level of care:   SKILLED NURSING   (*CSW will update this form in Epic as items are completed)   05/11/2014  Patient/family provided with [facility] Department of Clinical Social Work's list of facilities offering this level of care within the geographic area requested by the patient (or if unable, by the patient's family).  05/11/2014  Patient/family informed of their freedom to choose among providers that offer the needed level of care, that participate in Medicare, Medicaid or managed care program needed by the patient, have an available bed and are willing to accept the patient.  05/11/2014  Patient/family informed of [facility]' ownership interest in [facility], as well as of the fact that they are under no obligation to receive care at this facility.  PASARR submitted to EDS on 05/11/2014 PASARR number received on 05/11/2014  FL2 transmitted to all facilities in geographic area requested by pt/family on  05/11/2014 FL2 transmitted to all facilities within larger geographic area on   Patient informed that his/her managed care company has contracts with or will negotiate with  certain facilities, including the following:     Patient/family informed of bed offers received:   Patient chooses bed at  Physician recommends and patient chooses bed at    Patient to be transferred to  on   Patient to be transferred to facility by  Patient and family notified of transfer on  Name of family member notified:    The following physician request were entered in Epic:   Additional Comments:   Alison Murray, MSW, [place]  Work [phone]

## 2014-05-11 NOTE — Progress Notes (Signed)
Subjective: Pt still having left shoulder pain.  Shortness of breath is improved.  No n/v.  No high fevers.    Objective: Vital signs in last 24 hours: Temp:  [98.4 F (36.9 C)-100 F (37.8 C)] 98.8 F (37.1 C) (04/12 0800) Pulse Rate:  [94-109] 94 (04/12 0800) Resp:  [14-18] 14 (04/12 0900) BP: (103-127)/(47-60) 127/55 mmHg (04/12 0849) SpO2:  [93 %-95 %] 95 % (04/12 0900) Weight:  [90.6 kg (199 lb 11.8 oz)] 90.6 kg (199 lb 11.8 oz) (04/12 0500) Last BM Date: 05/07/14  Intake/Output from previous day: 04/11 0701 - 04/12 0700 In: 1875.2 [I.V.:1525.2; IV Piggyback:300] Out: 615 [Urine:615] Intake/Output this shift: Total I/O In: 374 [I.V.:174; IV Piggyback:200] Out: -   General appearance: alert, cooperative and mild distress Resp: breathing comfortably GI: soft, non tender except at drain sites.    Lab Results:   Recent Labs  05/10/14 0350 05/11/14 0345  WBC 15.4* 11.2*  HGB 9.1* 9.1*  HCT 29.2* 29.8*  PLT 369 355   BMET  Recent Labs  05/10/14 0350 05/11/14 0345  NA 139 139  K 3.3* 3.6  CL 103 107  CO2 24 22  GLUCOSE 111* 115*  BUN 15 18  CREATININE 1.37* 1.42*  CALCIUM 8.7 8.5   PT/INR  Recent Labs  05/09/14 0655  LABPROT 14.1  INR 1.08   ABG No results for input(s): PHART, HCO3 in the last 72 hours.  Invalid input(s): PCO2, PO2  Studies/Results: Dg Chest 1 View  05/10/2014   CLINICAL DATA:  Status post thoracentesis  EXAM: CHEST  1 VIEW  COMPARISON:  Chest radiograph and chest CT May 09, 2014  FINDINGS: No pneumothorax. Left effusion smaller following thoracentesis. There is some residual left effusion with left base atelectasis. There is a chest tube in the left subpleural region as well as a pigtail catheter in the left upper abdomen.  Right lung is clear. Heart is mildly enlarged with pulmonary vascularity, stable.  IMPRESSION: Left effusion smaller following thoracentesis. There is a small left effusion with left base atelectasis.  Subpulmonic chest tube remains on the left as does a drain in the upper abdomen on the left. Right lung is clear. No change in cardiac silhouette. No demonstrable pneumothorax.   Electronically Signed   By: Lowella Grip III M.D.   On: 05/10/2014 14:13   Ct Angio Chest Pe W/cm &/or Wo Cm  05/09/2014   CLINICAL DATA:  Shortness of breath, chest pain.  EXAM: CT ANGIOGRAPHY CHEST WITH CONTRAST  TECHNIQUE: Multidetector CT imaging of the chest was performed using the standard protocol during bolus administration of intravenous contrast. Multiplanar CT image reconstructions and MIPs were obtained to evaluate the vascular anatomy.  CONTRAST:  113mL OMNIPAQUE IOHEXOL 350 MG/ML SOLN  COMPARISON:  CT scan of April 29, 2014.  FINDINGS: No pneumothorax is noted. Moderate left pleural effusion is noted with associated atelectasis of the left lower lobe. Right lung is clear. There is no evidence of thoracic aortic dissection or aneurysm. There is no definite evidence of pulmonary embolus. No mediastinal mass or adenopathy is noted. No significant osseous abnormality is noted. Continued presence of drainage catheter seen between the stomach and left kidney. Continued presence of fluid collection seen medial to proximal stomach.  Review of the MIP images confirms the above findings.  IMPRESSION: No evidence of pulmonary embolus.  Moderate left pleural effusion is noted with associated atelectasis of the left lower lobe.  Continued presence of drainage catheter seen between the stomach  and left kidney, as well as continued and stable appearance of fluid collection medial to proximal stomach. Please refer to report of CT scan of April 29, 2014.   Electronically Signed   By: Marijo Conception, M.D.   On: 05/09/2014 16:56   US Thoracentesis Asp Pleural Space W/img Guide  05/10/2014   INDICATION: Symptomatic left sided pleural effusion  EXAM: US THORACENTESIS ASP PLEURAL SPACE W/IMG GUIDE  COMPARISON:  CXR 05/09/14.   MEDICATIONS: None  COMPLICATIONS: None immediate  TECHNIQUE: Informed written consent was obtained from the patient after a discussion of the risks, benefits and alternatives to treatment. A timeout was performed prior to the initiation of the procedure.  Initial ultrasound scanning demonstrates a left pleural effusion. The lower chest was prepped and draped in the usual sterile fashion. 1% lidocaine was used for local anesthesia.  Under direct ultrasound guidance, a 19 gauge, 10-cm, Yueh catheter was introduced. An ultrasound image was saved for documentation purposes. The thoracentesis was performed. The catheter was removed and a dressing was applied. The patient tolerated the procedure well without immediate post procedural complication. The patient was escorted to have an upright chest radiograph.  FINDINGS: A total of approximately 500 ml of blood tinged fluid was removed. Requested samples were sent to the laboratory.  IMPRESSION: Successful ultrasound-guided left sided thoracentesis yielding 500 ml of pleural fluid.  Read By:  Tsosie Billing PA-C   Electronically Signed   By: Sandi Mariscal M.D.   On: 05/10/2014 14:14    Anti-infectives: Anti-infectives    Start     Dose/Rate Route Frequency Ordered Stop   05/10/14 2000  ciprofloxacin (CIPRO) IVPB 400 mg     400 mg 200 mL/hr over 60 Minutes Intravenous Every 12 hours 05/10/14 1758     05/10/14 1800  piperacillin-tazobactam (ZOSYN) IVPB 3.375 g     3.375 g 12.5 mL/hr over 240 Minutes Intravenous Every 8 hours 05/10/14 1743     05/10/14 1100  doxycycline (VIBRA-TABS) tablet 100 mg     100 mg Oral 2 times daily 05/10/14 1008        Assessment/Plan: s/p * No surgery found * continue antibiotics  CT abd pelvis without IV contrast.   LOS: 2 days    [facility] 05/11/2014

## 2014-05-12 LAB — BASIC METABOLIC PANEL
Anion gap: 8 (ref 5–15)
BUN: 13 mg/dL (ref 6–23)
CALCIUM: 8 mg/dL — AB (ref 8.4–10.5)
CO2: 23 mmol/L (ref 19–32)
CREATININE: 1.1 mg/dL (ref 0.50–1.10)
Chloride: 106 mmol/L (ref 96–112)
GFR calc Af Amer: 59 mL/min — ABNORMAL LOW (ref >90)
GFR, EST NON AFRICAN AMERICAN: 51 mL/min — AB (ref >90)
GLUCOSE: 99 mg/dL (ref 70–99)
Potassium: 3 mmol/L — ABNORMAL LOW (ref 3.5–5.1)
Sodium: 137 mmol/L (ref 135–145)

## 2014-05-12 LAB — CBC
HEMATOCRIT: 26.7 % — AB (ref 36.0–46.0)
Hemoglobin: 8.3 g/dL — ABNORMAL LOW (ref 12.0–15.0)
MCH: 29.5 pg (ref 26.0–34.0)
MCHC: 31.1 g/dL (ref 30.0–36.0)
MCV: 95 fL (ref 78.0–100.0)
PLATELETS: 351 10*3/uL (ref 150–400)
RBC: 2.81 MIL/uL — AB (ref 3.87–5.11)
RDW: 16.2 % — ABNORMAL HIGH (ref 11.5–15.5)
WBC: 9.2 10*3/uL (ref 4.0–10.5)

## 2014-05-12 LAB — HEPARIN LEVEL (UNFRACTIONATED): HEPARIN UNFRACTIONATED: 0.52 [IU]/mL (ref 0.30–0.70)

## 2014-05-12 MED ORDER — ENOXAPARIN SODIUM 150 MG/ML ~~LOC~~ SOLN
135.0000 mg | SUBCUTANEOUS | Status: DC
  Administered 2014-05-12 – 2014-05-14 (×3): 135 mg via SUBCUTANEOUS
  Filled 2014-05-12 (×4): qty 1

## 2014-05-12 MED ORDER — FUROSEMIDE 10 MG/ML IJ SOLN
20.0000 mg | Freq: Once | INTRAMUSCULAR | Status: AC
Start: 2014-05-12 — End: 2014-05-12
  Administered 2014-05-12: 20 mg via INTRAVENOUS
  Filled 2014-05-12: qty 2

## 2014-05-12 MED ORDER — POTASSIUM CHLORIDE CRYS ER 20 MEQ PO TBCR
40.0000 meq | EXTENDED_RELEASE_TABLET | ORAL | Status: AC
  Administered 2014-05-12 (×2): 40 meq via ORAL
  Filled 2014-05-12 (×2): qty 2

## 2014-05-12 NOTE — Progress Notes (Signed)
OT Cancellation Note  Patient Details Name: [name] MRN: [id number] DOB: Jun 01, 1945   Cancelled Treatment:    Reason Eval/Treat Not Completed: Other (comment).  Pt moved up to 4th floor.  Not ready for OT right now.  Will check back tomorrow.  [name] 05/12/2014, 2:50 PM  Lesle Chris, OTR/L [phone] 05/12/2014

## 2014-05-12 NOTE — Progress Notes (Signed)
PAtient transferred from ICU to 1423, alert and oriented, denies any pain/diatress. No wound noted, surgical incision clean/dry, no drainage noted. Patient in bed resting, will continue to assess patient.

## 2014-05-12 NOTE — Progress Notes (Signed)
ANTICOAGULATION CONSULT NOTE - [place] for Lovenox Indication: hx PE and recurrent DVTs  Allergies  Allergen Reactions  . Sulfa Antibiotics Swelling    Patient Measurements: Height: 5' 9.5" (176.5 cm) Weight: 202 lb 9.6 oz (91.9 kg) IBW/kg (Calculated) : 67.35 Heparin Dosing Weight: 85 kg  Vital Signs: Temp: 98.2 F (36.8 C) (04/13 0700) Temp Source: Oral (04/13 0700) BP: 122/57 mmHg (04/13 0600) Pulse Rate: 93 (04/13 0600)  Labs:  Recent Labs  05/10/14 0350 05/11/14 0345 05/12/14 0335  HGB 9.1* 9.1* 8.3*  HCT 29.2* 29.8* 26.7*  PLT 369 355 351  HEPARINUNFRC 0.53 0.35 0.52  CREATININE 1.37* 1.42* 1.10    Estimated Creatinine Clearance: 60.5 mL/min (by C-G formula based on Cr of 1.1).  Assessment: 81 YOF presented 05/08/14 with lower left-sided abdominal pain s/p complex abdominal surgery (pancreas, spleen, colon, & kidney). 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 Lovenox transitioned to heparin on admit in anticipation of thoracentesis due to chest x-ray confirmation of pleural effusion on 05/09/14. Chest CT angio on 4/10 negative for new PE. S/p thoracentesis on 05/10/14. With no further surgical interventions planned, transition patient back to Lovenox per CCM's request.  Today, 05/12/14 Renal function has improved today- SCr 1.1 mg/dL, CrCl ~34mL/min. H/H low, but stable. Hgb has decreased to 8.3 and PLTs WNL. No bleeding noted.   Goal of Therapy:  Appropriate anticoagulation for history of DVT    Plan:  -Of note, patient was on Lovenox 120mg  SQ daily PTA, but admit weight on 04/08/14 was 87kg. Will adjust Lovenox dose to 135mg  SQ q24h (~1.5mg /kg/day). Will start first dose 1 hour after heparin gtt has been discontinued -Change CBC to Q72h -Monitor for sign/symptoms of bleeding  Gloriajean Dell, PharmD Candidate

## 2014-05-12 NOTE — Progress Notes (Addendum)
PULMONARY / CRITICAL CARE MEDICINE   Name: [name] MRN: [id number] DOB: May 19, 1945    ADMISSION DATE:  05/09/2014 CONSULTATION DATE:  05/09/14  REFERRING MD :  Rizwan/ [facility]   CHIEF COMPLAINT:  New L CP  INITIAL PRESENTATION: 89 yobf recovering from extensive surgery for pancreatic Ca with multiple drains in place for LUQ abscess post op but tapered opioids down to one daily abruptly worse with R Upper/ Lat CP > ER am 4/10 with worsening L lung aeration ? Effusion > admitted for further eval and PCCM service asked to see pma 4.10   past medical history of pancreatic cancer status post distal pancreatectomy, splenectomy and partial colectomy on 04/08/2014. She has a history of DVT 10 years ago and was placed on Coumadin and had an IVC filter placed. She was found in the fall of 2015 to have an acute DVT in the left leg also a thrombus involving the IVC filter. Patient is on Lovenox (Dr. Burr Medico) & needs anticoagulation indefinitely. Other past medical history includes peripheral neuropathy, essential tremor, "fast heart rate" on Inderal, hyperlipidemia.   STUDIES: CTa chest 4/10 >> no PE, Moderate left pleural effusion , drainage catheter seen between the stomach and left kidney, stable appearance of fluid collection medial to proximal stomach Compared to 3/31  CT abdomen (PO contrast only) >>Two left upper quadrant surgical drains remain in place and unchanged in position. Slight interval decrease in size of a fluid collection over the surgical bed adjacent the distal pancreatectomy    SIGNIFICANT EVENTS: 4/11 thora >> 500 cc blood tinged >>exudate, neutrophilic       SUBJECTIVE:  Afebrile Pain improved Appears weak  VITAL SIGNS: Temp:  [98.2 F (36.8 C)-99.5 F (37.5 C)] 98.2 F (36.8 C) (04/13 0700) Pulse Rate:  [87-107] 92 (04/13 0800) Resp:  [10-18] 16 (04/13 0800) BP: (106-129)/(33-76) 129/61 mmHg (04/13 0800) SpO2:  [95 %-98 %] 96 % (04/13 0800) Weight:  [91.9 kg  (202 lb 9.6 oz)] 91.9 kg (202 lb 9.6 oz) (04/13 0500) HEMODYNAMICS:   VENTILATOR SETTINGS:   INTAKE / OUTPUT:  Intake/Output Summary (Last 24 hours) at 05/12/14 0835 Last data filed at 05/12/14 0600  Gross per 24 hour  Intake 2345.25 ml  Output    892 ml  Net 1453.25 ml    PHYSICAL EXAMINATION: General: chronically > acutely ill, not toxic  Pt alert, approp nad   No jvd Oropharynx clear Neck supple Lungs  decreaed bs L base, no rhonchi RRR no s3 or or sign murmur Abd obese with poor excursion, 2 drains and diffuse mild tenderness, no rebound  Ext wam with no edema or clubbing noted Neuro  No motor deficits   LABS:  CBC  Recent Labs Lab 05/10/14 0350 05/11/14 0345 05/12/14 0335  WBC 15.4* 11.2* 9.2  HGB 9.1* 9.1* 8.3*  HCT 29.2* 29.8* 26.7*  PLT 369 355 351   Coag's  Recent Labs Lab 05/09/14 0655  INR 1.08   BMET  Recent Labs Lab 05/10/14 0350 05/11/14 0345 05/12/14 0335  NA 139 139 137  K 3.3* 3.6 3.0*  CL 103 107 106  CO2 24 22 23   BUN 15 18 13   CREATININE 1.37* 1.42* 1.10  GLUCOSE 111* 115* 99   Electrolytes  Recent Labs Lab 05/10/14 0350 05/11/14 0345 05/12/14 0335  CALCIUM 8.7 8.5 8.0*   Sepsis Markers No results for input(s): LATICACIDVEN, PROCALCITON, O2SATVEN in the last 168 hours. ABG No results for input(s): PHART, PCO2ART, PO2ART in the  last 168 hours. Liver Enzymes  Recent Labs Lab 05/09/14 0655  AST 38*  ALT 17  ALKPHOS 78  BILITOT 0.5  ALBUMIN 3.8   Cardiac Enzymes No results for input(s): TROPONINI, PROBNP in the last 168 hours. Glucose No results for input(s): GLUCAP in the last 168 hours.  Imaging Ct Abdomen Pelvis Wo Contrast  05/11/2014   CLINICAL DATA:  Evaluate for fluid collection. Pancreatic cancer. Previous distal pancreatectomy, splenectomy and partial colectomy.  EXAM: CT ABDOMEN AND PELVIS WITHOUT CONTRAST  TECHNIQUE: Multidetector CT imaging of the abdomen and pelvis was performed following the  standard protocol without IV contrast.  COMPARISON:  04/29/2014  FINDINGS: Lung bases demonstrate a tiny amount of right pleural fluid and associated dependent atelectasis. There is a moderate size left pleural fluid collection with associated consolidation likely compressive atelectasis. Mild stable cardiomegaly.  Abdominal images demonstrate no change in positioning of the two left upper quadrant surgical drains. There is slightly less fluid adjacent the pigtail portion of the catheter. There is a slight decrease in size of a fluid collection over the surgical suture line overlying the distal pancreas measuring 2.1 x 2.7 cm (previously 2.7 x 3 cm) with no change in a small focus of air along the anterior aspect of the fluid collection.  Previous cholecystectomy. The liver, adrenal glands and kidneys are otherwise unchanged. IVC filter unchanged. Postsurgical change over the anterior abdominal wall in the midline over the epigastric region.  Postsurgical change over the left colon as the anastomotic site is patent. There remains mild stranding of the adjacent mesenteric fat adjacent the anastomotic site. The appendix is normal. There is moderate diverticulosis throughout the colon.  Pelvic images demonstrate a Foley catheter within a decompressed bladder. Rectosigmoid colon is normal. There is a small amount of free fluid slightly improved. Degenerative changes of the spine and hips.  IMPRESSION: Two left upper quadrant surgical drains remain in place and unchanged in position. No significant fluid collection within the left upper quadrant.  Slight interval decrease in size of a fluid collection over the surgical bed adjacent the distal pancreatectomy measuring 2.1 x 2.7 cm (previously 2.7 x 3 cm).  Interval improvement small amount of free pelvic fluid.  Stable stranding of the mesenteric fat adjacent the colonic anastomosis in the left mid abdomen likely postsurgical change.  Other stable chronic changes within  the abdomen.  Continued moderate left pleural effusion with associated compressive atelectasis. Tiny right pleural fluid collection with associated atelectasis.   Electronically Signed   By: Marin Olp M.D.   On: 05/11/2014 17:02     ASSESSMENT / PLAN:  1)  Acute onset L cp new pattern from her LUQ pain post op   Left pleural  sympathetic effusion/parapneumonic related to recent LUQ abscess, atx. Cytology neg, Low glucose & pH concerning, cx neg Prior DVT s/p IVC filter AKI -improving Abdominal abscess - improved on CT  Rec: Continue zosyn/cipro empiric for pseudomonas -isolated on prior admit from abd abscess Can resume lovenox since no other procedures planned Would rpt CXR in 1 week to FU effusion, sooner if fevers return (for reaccumulation of effusion) Can transfer to floor PCCM to sign off   Kara Mead MD. Shade Flood. [facility] Pager [phone] If no response call [phone]

## 2014-05-12 NOTE — Progress Notes (Signed)
Referring Physician(s): CCS  Subjective: Patient denies any significant abdominal pain.  Allergies: Sulfa antibiotics  Medications: Prior to Admission medications   Medication Sig Start Date End Date Taking? Authorizing Provider  atorvastatin (LIPITOR) 40 MG tablet Take 1 tablet (40 mg total) by mouth daily. 05/05/14  Yes Gerlene Fee, NP  diltiazem (CARTIA XT) 120 MG 24 hr capsule Take 1 capsule (120 mg total) by mouth daily. 05/05/14  Yes Gerlene Fee, NP  DOXYCYCLINE HYCLATE PO Take 100 mg by mouth 2 (two) times daily.   Yes Historical Provider, MD  enoxaparin (LOVENOX) 120 MG/0.8ML injection Inject 0.8 mLs (120 mg total) into the skin daily. 05/06/14  Yes Truitt Merle, MD  latanoprost (XALATAN) 0.005 % ophthalmic solution Place 1 drop into both eyes at bedtime.   Yes Historical Provider, MD  pantoprazole (PROTONIX) 40 MG tablet TAKE 1 TABLET BY MOUTH DAILY 03/28/14  Yes Leandrew Koyanagi, MD  primidone (MYSOLINE) 50 MG tablet Take 50 mg by mouth at bedtime. Patient takes at nite   Yes Historical Provider, MD  propranolol ER (INDERAL LA) 120 MG 24 hr capsule TAKE ONE CAPSULE BY MOUTH DAILY 03/28/14  Yes Mancel Bale, PA-C  sucralfate (CARAFATE) 1 GM/10ML suspension Take 10 mLs (1 g total) by mouth 4 (four) times daily -  with meals and at bedtime. 05/04/14  Yes [name], MD  tamsulosin (FLOMAX) 0.4 MG CAPS capsule Take 1 capsule (0.4 mg total) by mouth daily. 05/04/14  Yes [name], MD  traMADol (ULTRAM) 50 MG tablet Take 1 tablet (50 mg total) by mouth every 6 (six) hours as needed. 05/04/14  Yes [name], MD  loperamide (IMODIUM) 2 MG capsule Take 1 capsule (2 mg total) by mouth 3 (three) times daily before meals. 05/04/14   [name], MD    Vital Signs: BP 119/61 mmHg  Pulse 98  Temp(Src) 98.1 F (36.7 C) (Oral)  Resp 22  Ht 5' 9.5" (1.765 m)  Wt 202 lb 9.6 oz (91.9 kg)  BMI 29.50 kg/m2  SpO2 98%  Physical Exam General: A&Ox3, NAD Abd: Soft, NT, ND, LUQ IR drain  with no recorded output in last 24 hrs, minimal fluid within bulb  Imaging: Ct Abdomen Pelvis Wo Contrast  05/11/2014   CLINICAL DATA:  Evaluate for fluid collection. Pancreatic cancer. Previous distal pancreatectomy, splenectomy and partial colectomy.  EXAM: CT ABDOMEN AND PELVIS WITHOUT CONTRAST  TECHNIQUE: Multidetector CT imaging of the abdomen and pelvis was performed following the standard protocol without IV contrast.  COMPARISON:  04/29/2014  FINDINGS: Lung bases demonstrate a tiny amount of right pleural fluid and associated dependent atelectasis. There is a moderate size left pleural fluid collection with associated consolidation likely compressive atelectasis. Mild stable cardiomegaly.  Abdominal images demonstrate no change in positioning of the two left upper quadrant surgical drains. There is slightly less fluid adjacent the pigtail portion of the catheter. There is a slight decrease in size of a fluid collection over the surgical suture line overlying the distal pancreas measuring 2.1 x 2.7 cm (previously 2.7 x 3 cm) with no change in a small focus of air along the anterior aspect of the fluid collection.  Previous cholecystectomy. The liver, adrenal glands and kidneys are otherwise unchanged. IVC filter unchanged. Postsurgical change over the anterior abdominal wall in the midline over the epigastric region.  Postsurgical change over the left colon as the anastomotic site is patent. There remains mild stranding of the adjacent mesenteric fat adjacent  the anastomotic site. The appendix is normal. There is moderate diverticulosis throughout the colon.  Pelvic images demonstrate a Foley catheter within a decompressed bladder. Rectosigmoid colon is normal. There is a small amount of free fluid slightly improved. Degenerative changes of the spine and hips.  IMPRESSION: Two left upper quadrant surgical drains remain in place and unchanged in position. No significant fluid collection within the left  upper quadrant.  Slight interval decrease in size of a fluid collection over the surgical bed adjacent the distal pancreatectomy measuring 2.1 x 2.7 cm (previously 2.7 x 3 cm).  Interval improvement small amount of free pelvic fluid.  Stable stranding of the mesenteric fat adjacent the colonic anastomosis in the left mid abdomen likely postsurgical change.  Other stable chronic changes within the abdomen.  Continued moderate left pleural effusion with associated compressive atelectasis. Tiny right pleural fluid collection with associated atelectasis.   Electronically Signed   By: Marin Olp M.D.   On: 05/11/2014 17:02   Dg Chest 1 View  05/10/2014   CLINICAL DATA:  Status post thoracentesis  EXAM: CHEST  1 VIEW  COMPARISON:  Chest radiograph and chest CT May 09, 2014  FINDINGS: No pneumothorax. Left effusion smaller following thoracentesis. There is some residual left effusion with left base atelectasis. There is a chest tube in the left subpleural region as well as a pigtail catheter in the left upper abdomen.  Right lung is clear. Heart is mildly enlarged with pulmonary vascularity, stable.  IMPRESSION: Left effusion smaller following thoracentesis. There is a small left effusion with left base atelectasis. Subpulmonic chest tube remains on the left as does a drain in the upper abdomen on the left. Right lung is clear. No change in cardiac silhouette. No demonstrable pneumothorax.   Electronically Signed   By: Lowella Grip III M.D.   On: 05/10/2014 14:13   Dg Chest 2 View  05/09/2014   CLINICAL DATA:  69 year old female with shortness of breath and chest pain. Initial encounter.  Personal history of distal pancreatectomy and splenectomy.  EXAM: CHEST  2 VIEW  COMPARISON:  Chest and abdominal series 4 /09/2014. CT Abdomen and Pelvis 04/29/2014, portable chest 04/17/2014.  FINDINGS: Semi upright AP and lateral views of the chest. Left pleural effusion has progressed and is moderate to large.  Increased opacification at the left lung base. Lower lung volumes.  Stable cardiac size and mediastinal contours. No pneumothorax or pulmonary edema. Grossly clear right lung. Visualized tracheal air column is within normal limits.  Left abdomen percutaneous drains remain in place. Partially visible IVC filter.  IMPRESSION: 1. Progressed left pleural effusion, now moderate to large. Associated decreased ventilation in the left lung. 2. Lower lung volumes.   Electronically Signed   By: Genevie Ann M.D.   On: 05/09/2014 08:49   Ct Angio Chest Pe W/cm &/or Wo Cm  05/09/2014   CLINICAL DATA:  Shortness of breath, chest pain.  EXAM: CT ANGIOGRAPHY CHEST WITH CONTRAST  TECHNIQUE: Multidetector CT imaging of the chest was performed using the standard protocol during bolus administration of intravenous contrast. Multiplanar CT image reconstructions and MIPs were obtained to evaluate the vascular anatomy.  CONTRAST:  144mL OMNIPAQUE IOHEXOL 350 MG/ML SOLN  COMPARISON:  CT scan of April 29, 2014.  FINDINGS: No pneumothorax is noted. Moderate left pleural effusion is noted with associated atelectasis of the left lower lobe. Right lung is clear. There is no evidence of thoracic aortic dissection or aneurysm. There is no definite evidence of  pulmonary embolus. No mediastinal mass or adenopathy is noted. No significant osseous abnormality is noted. Continued presence of drainage catheter seen between the stomach and left kidney. Continued presence of fluid collection seen medial to proximal stomach.  Review of the MIP images confirms the above findings.  IMPRESSION: No evidence of pulmonary embolus.  Moderate left pleural effusion is noted with associated atelectasis of the left lower lobe.  Continued presence of drainage catheter seen between the stomach and left kidney, as well as continued and stable appearance of fluid collection medial to proximal stomach. Please refer to report of CT scan of April 29, 2014.   Electronically  Signed   By: Marijo Conception, M.D.   On: 05/09/2014 16:56   Dg Abd Acute W/chest  05/08/2014   CLINICAL DATA:  Left upper quadrant and left lower quadrant abdominal pain.  EXAM: DG ABDOMEN ACUTE W/ 1V CHEST  COMPARISON:  04/29/2014  FINDINGS: Two surgical drains are identified within the left upper quadrant of the abdomen. These are similar in position to 3/30 1/16. There is an IVC filter in place and there are surgical clips from prior cholecystectomy. There is no evidence of dilated bowel loops or free intraperitoneal air. No radiopaque calculi or other significant radiographic abnormality is seen. Heart size and mediastinal contours are within normal limits. Both lungs are clear. Moderate left pleural effusion is again noted.  IMPRESSION: 1. Nonobstructive bowel gas pattern. 2. Left pleural effusion 3. Similar position of left upper quadrant surgical drains.   Electronically Signed   By: Kerby Moors M.D.   On: 05/08/2014 18:27   US Thoracentesis Asp Pleural Space W/img Guide  05/10/2014   INDICATION: Symptomatic left sided pleural effusion  EXAM: US THORACENTESIS ASP PLEURAL SPACE W/IMG GUIDE  COMPARISON:  CXR 05/09/14.  MEDICATIONS: None  COMPLICATIONS: None immediate  TECHNIQUE: Informed written consent was obtained from the patient after a discussion of the risks, benefits and alternatives to treatment. A timeout was performed prior to the initiation of the procedure.  Initial ultrasound scanning demonstrates a left pleural effusion. The lower chest was prepped and draped in the usual sterile fashion. 1% lidocaine was used for local anesthesia.  Under direct ultrasound guidance, a 19 gauge, 10-cm, Yueh catheter was introduced. An ultrasound image was saved for documentation purposes. The thoracentesis was performed. The catheter was removed and a dressing was applied. The patient tolerated the procedure well without immediate post procedural complication. The patient was escorted to have an upright  chest radiograph.  FINDINGS: A total of approximately 500 ml of blood tinged fluid was removed. Requested samples were sent to the laboratory.  IMPRESSION: Successful ultrasound-guided left sided thoracentesis yielding 500 ml of pleural fluid.  Read By:  Tsosie Billing PA-C   Electronically Signed   By: Sandi Mariscal M.D.   On: 05/10/2014 14:14    Labs:  CBC:  Recent Labs  05/09/14 0655 05/09/14 0721 05/10/14 0350 05/11/14 0345 05/12/14 0335  WBC 12.5*  --  15.4* 11.2* 9.2  HGB 10.3* 12.6 9.1* 9.1* 8.3*  HCT 34.2* 37.0 29.2* 29.8* 26.7*  PLT 476*  --  369 355 351    COAGS:  Recent Labs  04/01/14 1400 04/08/14 0630 04/09/14 0404 05/09/14 0655  INR 1.11 1.06 1.12 1.08  APTT  --  40* 33  --     BMP:  Recent Labs  05/04/14 0415 05/09/14 0721 05/10/14 0350 05/11/14 0345 05/12/14 0335  NA 139 141 139 139 137  K 3.6  3.7 3.3* 3.6 3.0*  CL 105 102 103 107 106  CO2 26  --  24 22 23   GLUCOSE 95 140* 111* 115* 99  BUN 19 6 15 18 13   CALCIUM 8.6  --  8.7 8.5 8.0*  CREATININE 1.54* 1.10 1.37* 1.42* 1.10  GFRNONAA 34*  --  39* 37* 51*  GFRAA 39*  --  45* 43* 59*    LIVER FUNCTION TESTS:  Recent Labs  04/01/14 1400 04/24/14 2039  04/30/14 0450  05/02/14 0615 05/03/14 0440 05/04/14 0415 05/09/14 0655 05/10/14 0350  BILITOT 0.7 0.5  --  0.3  --   --   --   --  0.5  --   AST 35 30  --  29  --   --   --   --  38*  --   ALT 21 13  --  10  --   --   --   --  17  --   ALKPHOS 75 83  --  65  --   --   --   --  78  --   PROT 7.6 6.1  --  6.0  --   --   --   --  7.6 6.7  ALBUMIN 3.9 2.4*  < > 3.0*  < > 3.2* 3.1* 3.2* 3.8  --   < > = values in this interval not displayed.  Assessment and Plan: S/p distal pancreatectomy/splenectomy/partial colectomy 04/08/14 for panc cancer LUQ abscess s/p IR drain LUQ 3/18, repeat CT shows resolved collection, D/w Dr. Kathlene Cote and ok to remove IR LUQ drain if ok with CCS-await input Afebrile, wbc nl, output minimal. Plans per CCS  regarding LLQ surgical drain.  SignedHedy Jacob 05/12/2014, 3:06 PM   I spent a total of 15 Minutes in face to face in clinical consultation/evaluation, greater than 50% of which was counseling/coordinating care for intraabdominal abscess.

## 2014-05-12 NOTE — Progress Notes (Signed)
Report called to receiving RN on 4W. Pt currently without any complaints of pain, stable and moving well as 2 person assist. Meds caught up and pt transferred safely with all belongings. Pt AAOx4, GCS 15 upon time of transfer.

## 2014-05-12 NOTE — Progress Notes (Signed)
PT Cancellation Note  Patient Details Name: [name] MRN: [id number] DOB: March 27, 1945   Cancelled Treatment:    Reason Eval/Treat Not Completed: Fatigue/lethargy limiting ability to participate (RN reports patient iritable and does not want to be bothered at this time, will check back later.)   Claretha Cooper 05/12/2014, 9:53 AM Tresa Endo PT [phone]

## 2014-05-12 NOTE — Progress Notes (Signed)
PT Cancellation Note  Patient Details Name: KYARAH ENAMORADO MRN: [id number] DOB: [birth year]   Cancelled Treatment:    Reason Eval/Treat Not Completed: Medical issues which prohibited therapy (reports that she can breathe and cannot get up. RN notified.)   Claretha Cooper 05/12/2014, 5:42 PM Tresa Endo PT [phone]

## 2014-05-12 NOTE — Progress Notes (Signed)
CSW assisting with d/c planning. PN reviewed. Pt is unsure, at this time, if she will return to [facility] or chose a new SNF for [facility]. Bed offers provided to pt. Pt will review options with her daughter . CSW will continue to follow to assist with d/c planning to SNF when stable.  Werner Lean LCSW [phone]

## 2014-05-12 NOTE — Progress Notes (Signed)
Patient ID: [name], female   DOB: 07-Aug-1945, 69 y.o.   MRN: [id number]  [facility] PROGRESS NOTE  CHARLYN VIALPANDO APO:[id number] DOB: 15-Nov-1945 DOA: 05/09/2014 PCP: Mauricio Po, FNP   Brief narrative:    69 y.o. 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 She has followed with Dr Burr Medico with plan is to start chemo soon. Pt presented with left sided chest pain and upper abd pain and was found to have moderate sized pleural effusion requiring IR guided left thoracentesis 4/11 with removal of 500 cc of fluid.   Assessment/Plan:    Principal Problem:   Left chest/ abdominal pain - still with mild discomfort this am but reports feeling better  - partly due to drains and possibly due to effusion, ? parapneumonic effusion related to recent LUQ abscess  - now status post left thoracentesis 4/11 with 500 cc fluid removed, exudate  - appreciate IR and PCCM input - continue current ABX Zosyn and Cipro to cover for pseudomonas  - will need repeat CXR in 1 week for follow up or sooner based on clinical progress    Moderate to large acute left pleural effusion - unclear etiology as noted above - malignancy still not excluded but possibly related to extension of the abd abscess - respiratory status is stable this AM but breath sounds still diminished on the left side - will need to monitor closely but pt can transfer to telemetry bed today    LUQ abscess with recent cultures growing Pseudomonas - status post drainage on 3/18 - WBC continue trending down and is WNL this am - repeat CT abd with decreasing size of the abscess  - continuing Zosyn and Cipro as noted above    Slight volume overload -  weight is up since admission from 190 to 195 lbs on 4/12 --> 202 lbs this AM - stop IVF today and give low dose lasix 20 mg IV x 1 dose (4/13) - monitor weight and strict I's and O's   Hypokalemia - supplement and repeat BMP in AM   Prior DVT s/p IVC filter - resume Lovenox today per pharmacy    Acute renal failure - appears to be pre renal  - Cr is now WNL   Anemia of acute illness imposed on anemia of chronic disease, malignancy - no signs of active bleeding but Hg still down overnight  - no indication for transfusion at this time  - repeat CBC in AM   Sepsis secondary to the above, L pleural effusion, ? parapneumonic - on 4/11 - WBC 15 K, T 100F, HR in 110 with BP 100/33 - continue ABX as noted above and repeat CBC in AM   Pancreatic cancer of tail, s/p distal pancreatectomy, splenectomy, & partial colectomy 04/08/2014 - to follow up with Dr. Barry Dienes and Dr. Burr Medico, will schedule the appointments    Severe PCM - in the context of acute illness - nutritionist consulted    Urinary retention - despite multiple voiding trails after surgery, she continued to retain urine - foley was continued - has been on Flomax since last admission   Tremors   - on Primidone   Acute functional quadriplegia - in the setting of acute illness -  PT evaluation requested   DVT prophylaxis - transition to Lovenox per pharmacy   Code Status: Full.  Family Communication:  plan of care discussed with the patient Disposition Plan: Transfer to telemetry bed   IV access:  Peripheral IV  Procedures and diagnostic studies:    Ct Abdomen Pelvis Wo Contrast  05/11/2014   Two left upper quadrant surgical drains remain in place and unchanged in position. No significant fluid collection within the left upper quadrant.  Slight interval decrease in size of a fluid collection over the surgical bed adjacent the distal pancreatectomy measuring 2.1 x 2.7 cm (previously 2.7 x 3 cm).  Interval improvement small amount of  free pelvic fluid.  Stable stranding of the mesenteric fat adjacent the colonic anastomosis in the left mid abdomen likely postsurgical change.  Other stable chronic changes within the abdomen.  Continued moderate left pleural effusion with associated compressive atelectasis. Tiny right pleural fluid collection with associated atelectasis.      Dg Chest 1 View   05/10/2014   Left effusion smaller following thoracentesis. There is a small left effusion with left base atelectasis. Subpulmonic chest tube remains on the left as does a drain in the upper abdomen on the left. Right lung is clear. No change in cardiac silhouette. No demonstrable pneumothorax.     Dg Chest 2 View  05/09/2014   Progressed left pleural effusion, now moderate to large. Associated decreased ventilation in the left lung. 2. Lower lung volumes.     Ct Angio Chest Pe W/cm &/or Wo Cm  05/09/2014   No evidence of pulmonary embolus.  Moderate left pleural effusion is noted with associated atelectasis of the left lower lobe.  Continued presence of drainage catheter seen between the stomach and left kidney, as well as continued and stable appearance of fluid collection medial to proximal stomach.   Dg Abd Acute W/chest  05/08/2014  Nonobstructive bowel gas pattern. Left pleural effusion  Similar position of left upper quadrant surgical drains.    US Thoracentesis Asp Pleural Space W/img Guide 05/10/2014  Successful ultrasound-guided left sided thoracentesis yielding 500 ml of pleural fluid.    Medical Consultants:  PCCM IR  Other Consultants:  PT/OT  IAnti-Infectives:   Zosyn 4/10 --> Cipro 4/10 -->  Faye Ramsay, MD  [facility] Pager [phone]  If 7PM-7AM, please contact night-coverage [email or web address] Password [email or web address] 05/12/2014, 8:45 AM   LOS: 3 days   HPI/Subjective: Pt still with abd pain and left sided chest pain, says appetite is poor and she is not feeling like she is hungry.   Objective: Filed Vitals:   05/12/14 0500  05/12/14 0600 05/12/14 0700 05/12/14 0800  BP:  122/57  129/61  Pulse:  93  92  Temp:   98.2 F (36.8 C)   TempSrc:   Oral   Resp:  15  16  Height:      Weight: 91.9 kg (202 lb 9.6 oz)     SpO2:  98%  96%    Intake/Output Summary (Last 24 hours) at 05/12/14 0845 Last data filed at 05/12/14 0600  Gross per 24 hour  Intake 2145.25 ml  Output    892 ml  Net 1253.25 ml    Exam:   General:  Pt is tired but looks better this AM  Cardiovascular: Regular rhythm, tachycardic, S1/S2, no rubs, no gallops  Respiratory: Diminished air movement on the left side, no crackles   Abdomen: Soft, slightly tender in epigastric area, non distended, bowel sounds present  Extremities: pulses DP and PT palpable bilaterally  Data Reviewed: Basic Metabolic Panel:  Recent Labs Lab 05/09/14 0721 05/10/14 0350 05/11/14 0345 05/12/14 0335  NA 141 139 139 137  K 3.7 3.3* 3.6 3.0*  CL 102 103 107 106  CO2  --  24 22 23   GLUCOSE 140* 111* 115* 99  BUN 6 15 18 13   CREATININE 1.10 1.37* 1.42* 1.10  CALCIUM  --  8.7 8.5 8.0*   Liver Function Tests:  Recent Labs Lab 05/09/14 0655 05/10/14 0350  AST 38*  --   ALT 17  --   ALKPHOS 78  --   BILITOT 0.5  --   PROT 7.6 6.7  ALBUMIN 3.8  --     Recent Labs Lab 05/09/14 0655  LIPASE 85*   CBC:  Recent Labs Lab 05/09/14 0655 05/09/14 0721 05/10/14 0350 05/11/14 0345 05/12/14 0335  WBC 12.5*  --  15.4* 11.2* 9.2  HGB 10.3* 12.6 9.1* 9.1* 8.3*  HCT 34.2* 37.0 29.2* 29.8* 26.7*  MCV 97.2  --  96.7 96.4 95.0  PLT 476*  --  369 355 351    Recent Results (from the past 240 hour(s))  MRSA PCR Screening     Status: None   Collection Time: 05/09/14 12:27 PM  Result Value Ref Range Status   MRSA by PCR NEGATIVE NEGATIVE Final  Body fluid culture     Status: None (Preliminary result)   Collection Time: 05/10/14  2:01 PM  Result Value Ref Range Status   Specimen Description PLEURAL  Final   Special Requests NONE  Final   Gram  Stain   Final    FEW WBC PRESENT,BOTH PMN AND MONONUCLEAR NO ORGANISMS SEEN Performed at [facility]    Culture   Final    NO GROWTH 1 DAY Performed at [facility]    Report Status PENDING  Incomplete     Scheduled Meds: . ciprofloxacin  400 mg Intravenous Q12H  . diltiazem  30 mg Oral 4 times per day  . feeding supplement (RESOURCE BREEZE)  1 Container Oral BID BM  . latanoprost  1 drop Both Eyes QHS  . pantoprazole  40 mg Oral BID AC  . piperacillin-tazobactam (ZOSYN)  IV  3.375 g Intravenous Q8H  . primidone  50 mg Oral QHS  . sodium chloride  3 mL Intravenous Q12H  . sucralfate  1 g Oral TID WC & HS  . tamsulosin  0.4 mg Oral Daily    Continuous Infusions: . sodium chloride 30 mL/hr at 05/11/14 2251  . heparin 1,200 Units/hr (05/11/14 1800)

## 2014-05-12 NOTE — Telephone Encounter (Signed)
Called Ms [name] back today & informed per Dr Ernestina Penna OV note that she would probably be on lovenox indefinitely but pt will see Dr Burr Medico 05/27/14 & will re-evaluate.

## 2014-05-12 NOTE — Progress Notes (Signed)
Pt extremely irritable this am. Placed on bedside commode and bathed at 0830. Pt wanted to be put back to bed and not bothered again until 1100. Will administer 1000,1100 and 1200 meds all at that time. Pt refused to work with PT this am. PT will be notified if pt changes mind later this afternoon.

## 2014-05-12 NOTE — Progress Notes (Signed)
Patient ID: [name], female   DOB: 09-12-1945, 69 y.o.   MRN: [id number]    Subjective: Pt still having left shoulder pain with deep breath.  Shortness of breath still occuring when moving from bed to bed.  CT with improvement in abdominal abscesses.    Objective: Vital signs in last 24 hours: Temp:  [98.2 F (36.8 C)-99.5 F (37.5 C)] 98.2 F (36.8 C) (04/13 0700) Pulse Rate:  [87-107] 92 (04/13 0800) Resp:  [10-18] 16 (04/13 0800) BP: (111-129)/(33-76) 129/61 mmHg (04/13 0800) SpO2:  [96 %-98 %] 96 % (04/13 0800) Weight:  [91.9 kg (202 lb 9.6 oz)] 91.9 kg (202 lb 9.6 oz) (04/13 0500) Last BM Date: 05/12/14  Intake/Output from previous day: 04/12 0701 - 04/13 0700 In: 2432.3 [P.O.:300; I.V.:1607.3; IV Piggyback:525] Out: 833 [Urine:890; Drains:2] Intake/Output this shift: Total I/O In: 750 [I.V.:550; IV Piggyback:200] Out: 175 [Urine:175]  General appearance: alert, cooperative and mild distress Resp: breathing comfortably GI: soft, non tender except at drain sites.    Lab Results:   Recent Labs  05/11/14 0345 05/12/14 0335  WBC 11.2* 9.2  HGB 9.1* 8.3*  HCT 29.8* 26.7*  PLT 355 351   BMET  Recent Labs  05/11/14 0345 05/12/14 0335  NA 139 137  K 3.6 3.0*  CL 107 106  CO2 22 23  GLUCOSE 115* 99  BUN 18 13  CREATININE 1.42* 1.10  CALCIUM 8.5 8.0*   PT/INR No results for input(s): LABPROT, INR in the last 72 hours. ABG No results for input(s): PHART, HCO3 in the last 72 hours.  Invalid input(s): PCO2, PO2  Studies/Results: Ct Abdomen Pelvis Wo Contrast  05/11/2014   CLINICAL DATA:  Evaluate for fluid collection. Pancreatic cancer. Previous distal pancreatectomy, splenectomy and partial colectomy.  EXAM: CT ABDOMEN AND PELVIS WITHOUT CONTRAST  TECHNIQUE: Multidetector CT imaging of the abdomen and pelvis was performed following the standard protocol without IV contrast.  COMPARISON:  04/29/2014  FINDINGS: Lung bases demonstrate a tiny amount of  right pleural fluid and associated dependent atelectasis. There is a moderate size left pleural fluid collection with associated consolidation likely compressive atelectasis. Mild stable cardiomegaly.  Abdominal images demonstrate no change in positioning of the two left upper quadrant surgical drains. There is slightly less fluid adjacent the pigtail portion of the catheter. There is a slight decrease in size of a fluid collection over the surgical suture line overlying the distal pancreas measuring 2.1 x 2.7 cm (previously 2.7 x 3 cm) with no change in a small focus of air along the anterior aspect of the fluid collection.  Previous cholecystectomy. The liver, adrenal glands and kidneys are otherwise unchanged. IVC filter unchanged. Postsurgical change over the anterior abdominal wall in the midline over the epigastric region.  Postsurgical change over the left colon as the anastomotic site is patent. There remains mild stranding of the adjacent mesenteric fat adjacent the anastomotic site. The appendix is normal. There is moderate diverticulosis throughout the colon.  Pelvic images demonstrate a Foley catheter within a decompressed bladder. Rectosigmoid colon is normal. There is a small amount of free fluid slightly improved. Degenerative changes of the spine and hips.  IMPRESSION: Two left upper quadrant surgical drains remain in place and unchanged in position. No significant fluid collection within the left upper quadrant.  Slight interval decrease in size of a fluid collection over the surgical bed adjacent the distal pancreatectomy measuring 2.1 x 2.7 cm (previously 2.7 x 3 cm).  Interval improvement small amount  of free pelvic fluid.  Stable stranding of the mesenteric fat adjacent the colonic anastomosis in the left mid abdomen likely postsurgical change.  Other stable chronic changes within the abdomen.  Continued moderate left pleural effusion with associated compressive atelectasis. Tiny right pleural  fluid collection with associated atelectasis.   Electronically Signed   By: Marin Olp M.D.   On: 05/11/2014 17:02   Dg Chest 1 View  05/10/2014   CLINICAL DATA:  Status post thoracentesis  EXAM: CHEST  1 VIEW  COMPARISON:  Chest radiograph and chest CT May 09, 2014  FINDINGS: No pneumothorax. Left effusion smaller following thoracentesis. There is some residual left effusion with left base atelectasis. There is a chest tube in the left subpleural region as well as a pigtail catheter in the left upper abdomen.  Right lung is clear. Heart is mildly enlarged with pulmonary vascularity, stable.  IMPRESSION: Left effusion smaller following thoracentesis. There is a small left effusion with left base atelectasis. Subpulmonic chest tube remains on the left as does a drain in the upper abdomen on the left. Right lung is clear. No change in cardiac silhouette. No demonstrable pneumothorax.   Electronically Signed   By: Lowella Grip III M.D.   On: 05/10/2014 14:13   US Thoracentesis Asp Pleural Space W/img Guide  05/10/2014   INDICATION: Symptomatic left sided pleural effusion  EXAM: US THORACENTESIS ASP PLEURAL SPACE W/IMG GUIDE  COMPARISON:  CXR 05/09/14.  MEDICATIONS: None  COMPLICATIONS: None immediate  TECHNIQUE: Informed written consent was obtained from the patient after a discussion of the risks, benefits and alternatives to treatment. A timeout was performed prior to the initiation of the procedure.  Initial ultrasound scanning demonstrates a left pleural effusion. The lower chest was prepped and draped in the usual sterile fashion. 1% lidocaine was used for local anesthesia.  Under direct ultrasound guidance, a 19 gauge, 10-cm, Yueh catheter was introduced. An ultrasound image was saved for documentation purposes. The thoracentesis was performed. The catheter was removed and a dressing was applied. The patient tolerated the procedure well without immediate post procedural complication. The patient  was escorted to have an upright chest radiograph.  FINDINGS: A total of approximately 500 ml of blood tinged fluid was removed. Requested samples were sent to the laboratory.  IMPRESSION: Successful ultrasound-guided left sided thoracentesis yielding 500 ml of pleural fluid.  Read By:  Tsosie Billing PA-C   Electronically Signed   By: Sandi Mariscal M.D.   On: 05/10/2014 14:14    Anti-infectives: Anti-infectives    Start     Dose/Rate Route Frequency Ordered Stop   05/10/14 2000  ciprofloxacin (CIPRO) IVPB 400 mg     400 mg 200 mL/hr over 60 Minutes Intravenous Every 12 hours 05/10/14 1758     05/10/14 1800  piperacillin-tazobactam (ZOSYN) IVPB 3.375 g     3.375 g 12.5 mL/hr over 240 Minutes Intravenous Every 8 hours 05/10/14 1743     05/10/14 1100  doxycycline (VIBRA-TABS) tablet 100 mg  Status:  Discontinued     100 mg Oral 2 times daily 05/10/14 1008 05/11/14 2251      Assessment/Plan: s/p * No surgery found * S/p distal pancreatectomy/partial nephrectomy/partial colectomy for pancreatic cancer. Here for pleural effusion and chest pain.    Continue antibiotics and drainage.   Continue diet as tolerated.     LOS: 3 days    [facility] 05/12/2014

## 2014-05-13 ENCOUNTER — Inpatient Hospital Stay (HOSPITAL_COMMUNITY): Payer: Medicare Other

## 2014-05-13 LAB — BASIC METABOLIC PANEL
ANION GAP: 8 (ref 5–15)
BUN: 11 mg/dL (ref 6–23)
CALCIUM: 8.3 mg/dL — AB (ref 8.4–10.5)
CHLORIDE: 107 mmol/L (ref 96–112)
CO2: 23 mmol/L (ref 19–32)
CREATININE: 1.07 mg/dL (ref 0.50–1.10)
GFR calc Af Amer: 61 mL/min — ABNORMAL LOW (ref >90)
GFR calc non Af Amer: 52 mL/min — ABNORMAL LOW (ref >90)
Glucose, Bld: 97 mg/dL (ref 70–99)
Potassium: 3.2 mmol/L — ABNORMAL LOW (ref 3.5–5.1)
SODIUM: 138 mmol/L (ref 135–145)

## 2014-05-13 MED ORDER — FUROSEMIDE 10 MG/ML IJ SOLN
20.0000 mg | Freq: Once | INTRAMUSCULAR | Status: AC
  Administered 2014-05-13: 20 mg via INTRAVENOUS
  Filled 2014-05-13: qty 2

## 2014-05-13 MED ORDER — METOPROLOL TARTRATE 1 MG/ML IV SOLN
5.0000 mg | INTRAVENOUS | Status: DC | PRN
  Administered 2014-05-13 – 2014-05-15 (×3): 5 mg via INTRAVENOUS
  Filled 2014-05-13 (×4): qty 5

## 2014-05-13 MED ORDER — POTASSIUM CHLORIDE CRYS ER 20 MEQ PO TBCR
40.0000 meq | EXTENDED_RELEASE_TABLET | Freq: Once | ORAL | Status: AC
  Administered 2014-05-13: 40 meq via ORAL
  Filled 2014-05-13: qty 2

## 2014-05-13 NOTE — Progress Notes (Signed)
ANTIBIOTIC CONSULT NOTE - Follow up  Pharmacy Consult for Zosyn, Cipro Indication: empyema  Allergies  Allergen Reactions  . Sulfa Antibiotics Swelling    Patient Measurements: Height: 5' 9.5" (176.5 cm) Weight: 203 lb 6.1 oz (92.253 kg) IBW/kg (Calculated) : 67.35  Vital Signs: Temp: 97.4 F (36.3 C) (04/14 0619) Temp Source: Oral (04/14 0619) BP: 127/59 mmHg (04/14 0619) Pulse Rate: 111 (04/14 0619) Intake/Output from previous day: 04/13 0701 - 04/14 0700 In: 750 [I.V.:550; IV Piggyback:200] Out: 547 [Urine:525; Drains:22]  Labs:  Recent Labs  05/11/14 0345 05/12/14 0335 05/13/14 0523  WBC 11.2* 9.2  --   HGB 9.1* 8.3*  --   PLT 355 351  --   CREATININE 1.42* 1.10 1.07   Estimated Creatinine Clearance: 62.3 mL/min (by C-G formula based on Cr of 1.07).  Microbiology: Recent Results (from the past 720 hour(s))  Culture, routine-abscess     Status: None   Collection Time: 04/16/14  3:41 PM  Result Value Ref Range Status   Specimen Description DRAINAGE  Final   Special Requests Normal  Final   Gram Stain   Final    NO WBC SEEN NO SQUAMOUS EPITHELIAL CELLS SEEN NO ORGANISMS SEEN Performed at [facility]    Culture   Final    FEW PSEUDOMONAS AERUGINOSA Performed at [facility]    Report Status 04/20/2014 FINAL  Final   Organism ID, Bacteria PSEUDOMONAS AERUGINOSA  Final      Susceptibility   Pseudomonas aeruginosa - MIC*    CEFEPIME <=1 SENSITIVE Sensitive     CEFTAZIDIME 2 SENSITIVE Sensitive     CIPROFLOXACIN <=0.25 SENSITIVE Sensitive     GENTAMICIN <=1 SENSITIVE Sensitive     IMIPENEM 1 SENSITIVE Sensitive     PIP/TAZO 8 SENSITIVE Sensitive     TOBRAMYCIN <=1 SENSITIVE Sensitive     * FEW PSEUDOMONAS AERUGINOSA  Clostridium Difficile by PCR     Status: None   Collection Time: 04/25/14  1:53 AM  Result Value Ref Range Status   C difficile by pcr NEGATIVE NEGATIVE Final  MRSA PCR Screening     Status: None   Collection Time:  05/09/14 12:27 PM  Result Value Ref Range Status   MRSA by PCR NEGATIVE NEGATIVE Final    Comment:        The GeneXpert MRSA Assay (FDA approved for NASAL specimens only), is one component of a comprehensive MRSA colonization surveillance program. It is not intended to diagnose MRSA infection nor to guide or monitor treatment for MRSA infections.   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  Pleural fluid reveals about 9000 WBCs 90% neutrophils- possibly an extension of abdominal abscess as she has no pneumonia - abscess culture grew pseudomonas previously.  Pharmacy consulted to dose Zosyn and  Cipro for empyema.  Await culture results.  4/11 >> Doxycycline >> (4/15) 4/11 >> Zosyn >> 4/11 >> Cipro >>  Tmax: afebrile WBC: improved to WNL Renal: SCr 1.07, CrCl~62 ml/min  4/14: Day #4 Zosyn and cipro.  Continue double coverage for possible psuedomonas.  Last pseuodmonas isolate (abscess drainage 04/16/14) was pan-sensitive, but patient has now had exposure to both Zosyn and Cipro and is at increased risk for resistance.  PCCM recommends to narrow to one antibiotic (Zosyn if inpatient, Cipro for discharge) IF pleural fluid culture is finalized as no growth.  Duration should be a total of 14 days.  Goal of Therapy:  Appropriate abx dosing, eradication of infection.    Plan:   Continue Zosyn 3.375g IV Q8H infused over 4hrs.  Continue Cipro 400mg  IV q12h  Follow up renal fxn, culture results, and clinical course.   Gretta Arab PharmD, BCPS Pager [phone] 05/13/2014 11:38 AM

## 2014-05-13 NOTE — Progress Notes (Signed)
OT Cancellation Note  Patient Details Name: [name] MRN: [id number] DOB: 18-Feb-1945   Cancelled Treatment:    Reason Eval/Treat Not Completed: Other (comment).  Noted pt is from SNF and plan is for her to return to SNF.  Will defer OT to that venue.  Adilson Grafton 05/13/2014, 11:38 AM  Lesle Chris, OTR/L [phone] 05/13/2014

## 2014-05-13 NOTE — Evaluation (Signed)
Physical Therapy Evaluation Patient Details Name: [name] MRN: [id number] DOB: [birth year] Today's Date: 05/13/2014   History of Present Illness  69 y.o. 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 She has followed with Dr Burr Medico with plan is to start chemo soon. Pt presented with left sided chest pain and upper abd pain and was found to have moderate sized pleural effusion requiring IR guided left thoracentesis 4/11.   Clinical Impression  Pt admitted with above diagnosis. Pt currently with functional limitations due to the deficits listed below (see PT Problem List).  Pt will benefit from skilled PT to increase their independence and safety with mobility to allow discharge to the venue listed below.   Pt admitted from SNF (rehab) and plans to return to SNF.     Follow Up Recommendations SNF    Equipment Recommendations  None recommended by PT    Recommendations for Other Services       Precautions / Restrictions Precautions Precautions: Fall Precaution Comments: 2 L sided drains      Mobility  Bed Mobility Overal bed mobility: Needs Assistance Bed Mobility: Supine to Sit;Sit to Supine     Supine to sit: Min assist;HOB elevated Sit to supine: Mod assist   General bed mobility comments: pt almost to EOB then stated she required assist, encouraged pt performing mobility as she is able however slight assist provided for scoot to EOB, assist for bil LEs onto bed per pt request  Transfers Overall transfer level: Needs assistance Equipment used: Rolling walker (2 wheeled) Transfers: Sit to/from Stand Sit to Stand: Min assist         General transfer comment: verbal cues for hand  placement  Ambulation/Gait Ambulation/Gait assistance: Min assist Ambulation Distance (Feet): 80 Feet Assistive device: Rolling walker (2 wheeled) Gait Pattern/deviations: Step-through pattern;Decreased stride length;Trunk flexed Gait velocity: decreased   General Gait Details: pt reports dizziness which did not resolve or become worse, distance per pt tolerance, very slow pace  Stairs            Wheelchair Mobility    Modified Rankin (Stroke Patients Only)       Balance                                             Pertinent Vitals/Pain Pain Score: 7  Pain Location: L flank Pain Descriptors / Indicators: Sore Pain Intervention(s): Limited activity within patient's tolerance;Monitored during session;Patient requesting pain meds-RN notified    Home Living Family/patient expects to be discharged to:: Skilled nursing facility                      Prior Function Level of Independence: Independent with assistive device(s)         Comments: prior to recent SNF     Hand Dominance        Extremity/Trunk Assessment   Upper Extremity Assessment: Generalized weakness           Lower Extremity Assessment: Generalized weakness         Communication   Communication: No difficulties  Cognition Arousal/Alertness: Awake/alert Behavior During  Therapy: WFL for tasks assessed/performed Overall Cognitive Status: Within Functional Limits for tasks assessed                      General Comments      Exercises        Assessment/Plan    PT Assessment Patient needs continued PT services  PT Diagnosis Difficulty walking;Generalized weakness   PT Problem List Decreased strength;Decreased activity tolerance;Decreased mobility;Decreased knowledge of use of DME;Pain  PT Treatment Interventions DME instruction;Gait training;Functional mobility training;Patient/family education;Therapeutic activities;Therapeutic exercise   PT  Goals (Current goals can be found in the Care Plan section) Acute Rehab PT Goals PT Goal Formulation: With patient Time For Goal Achievement: 05/20/14 Potential to Achieve Goals: Good    Frequency Min 3X/week   Barriers to discharge        Co-evaluation               End of Session   Activity Tolerance: Patient limited by pain;Patient limited by fatigue Patient left: in bed;with call bell/phone within reach;with bed alarm set;with family/visitor present           Time: 1002-1020 PT Time Calculation (min) (ACUTE ONLY): 18 min   Charges:   PT Evaluation $Initial PT Evaluation Tier I: 1 Procedure     PT G Codes:        Iwao [name],Barbara E 05/13/2014, 12:36 PM Carmelia Bake, PT, DPT 05/13/2014 Pager: [phone]

## 2014-05-13 NOTE — Progress Notes (Signed)
Pt HR sustaining 132-136. Asymptomatic. MD notified. Will continue to monitor. Informed oncoming nurse.

## 2014-05-13 NOTE — Progress Notes (Signed)
  Patient ID: [name], female   DOB: Mar 01, 1945, 69 y.o.   MRN: [id number]    Subjective: Pain and nausea improved.    Objective: Vital signs in last 24 hours: Temp:  [97.4 F (36.3 C)-99.1 F (37.3 C)] 99.1 F (37.3 C) (04/14 1407) Pulse Rate:  [100-111] 111 (04/14 1407) Resp:  [20] 20 (04/14 1407) BP: (123-145)/(52-76) 127/66 mmHg (04/14 1407) SpO2:  [97 %-98 %] 98 % (04/14 1407) Weight:  [92.253 kg (203 lb 6.1 oz)] 92.253 kg (203 lb 6.1 oz) (04/14 0619) Last BM Date: 05/13/14  Intake/Output from previous day: 04/13 0701 - 04/14 0700 In: 850 [P.O.:100; I.V.:550; IV Piggyback:200] Out: 547 [Urine:525; Drains:22] Intake/Output this shift: Total I/O In: 50 [P.O.:50] Out: 17 [Drains:17]  General appearance: alert, cooperative and mild distress Resp: breathing comfortably GI: soft, non tender except at drain sites.    Lab Results:   Recent Labs  05/11/14 0345 05/12/14 0335  WBC 11.2* 9.2  HGB 9.1* 8.3*  HCT 29.8* 26.7*  PLT 355 351   BMET  Recent Labs  05/12/14 0335 05/13/14 0523  NA 137 138  K 3.0* 3.2*  CL 106 107  CO2 23 23  GLUCOSE 99 97  BUN 13 11  CREATININE 1.10 1.07  CALCIUM 8.0* 8.3*   PT/INR No results for input(s): LABPROT, INR in the last 72 hours. ABG No results for input(s): PHART, HCO3 in the last 72 hours.  Invalid input(s): PCO2, PO2  Studies/Results: Dg Chest 2 View  05/13/2014   CLINICAL DATA:  Pancreatic cancer, shortness of breath, pleural effusions, weakness  EXAM: CHEST  2 VIEW  COMPARISON:  05/10/2014  FINDINGS: Pigtail drainage catheter LEFT upper quadrant.  Enlargement of cardiac silhouette.  Atherosclerotic calcification aorta.  Mediastinal contours and pulmonary vascularity otherwise normal.  Increased LEFT pleural effusion and basilar atelectasis since previous exam.  Remaining lungs clear.  No pneumothorax.  Bones demineralized.  IMPRESSION: Increased LEFT pleural effusion and basilar atelectasis.  Enlargement of  cardiac silhouette.   Electronically Signed   By: Lavonia Dana M.D.   On: 05/13/2014 14:11    Anti-infectives: Anti-infectives    Start     Dose/Rate Route Frequency Ordered Stop   05/10/14 2000  ciprofloxacin (CIPRO) IVPB 400 mg     400 mg 200 mL/hr over 60 Minutes Intravenous Every 12 hours 05/10/14 1758     05/10/14 1800  piperacillin-tazobactam (ZOSYN) IVPB 3.375 g     3.375 g 12.5 mL/hr over 240 Minutes Intravenous Every 8 hours 05/10/14 1743     05/10/14 1100  doxycycline (VIBRA-TABS) tablet 100 mg  Status:  Discontinued     100 mg Oral 2 times daily 05/10/14 1008 05/11/14 2251      Assessment/Plan: s/p * No surgery found * S/p distal pancreatectomy/partial nephrectomy/partial colectomy for pancreatic cancer. Here for pleural effusion and chest pain.    Continue antibiotics and drainage.   Continue diet as tolerated.     LOS: 4 days    [facility] 05/13/2014

## 2014-05-13 NOTE — Progress Notes (Signed)
Notified of HR in 130's. Added Metoprolol as needed for now and monitor clinical response.   Faye Ramsay, MD  [facility] Pager [phone]  If 7PM-7AM, please contact night-coverage [email or web address] Password [email or web address]

## 2014-05-13 NOTE — Progress Notes (Signed)
Patient ID: [name], female   DOB: [birth year], 69 y.o.   MRN: [id number]  [facility] PROGRESS NOTE  MACHELE DEIHL IEP:[id number] DOB: 1945-02-04 DOA: 05/09/2014 PCP: Mauricio Po, FNP   Brief narrative:    69 y.o. 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 She has followed with Dr Burr Medico with plan is to start chemo soon. Pt presented with left sided chest pain and upper abd pain and was found to have moderate sized pleural effusion requiring IR guided left thoracentesis 4/11 with removal of 500 cc of fluid.   Assessment/Plan:    Principal Problem:   Left chest/ abdominal pain - partly due to drains and possibly due to effusion, ? parapneumonic effusion related to recent LUQ abscess  - now status post left thoracentesis 4/11 with 500 cc fluid removed, exudate  - appreciate IR and PCCM input - continue current ABX Zosyn and Cipro to cover for pseudomonas until final culture reports are back  - repeat CXR today to follow up on pleural effusion status, ? Need for repeat thoracentesis    Moderate to large acute left pleural effusion - malignancy still not excluded but possibly related to extension of the abd abscess - respiratory status is stable this AM but breath sounds still diminished on the left side - repeat CXR today as noted above as pt may need repeat thoracentesis    LUQ abscess with recent cultures growing Pseudomonas - status post drainage on 3/18 - WBC continue trending down and is WNL this am - repeat CT abd with decreasing size of the abscess  - continuing Zosyn and Cipro as noted above per surgery team recommendations    Slight volume overload - weight is up since admission  from 190 to 195 lbs on 4/12 --> 203 lbs this AM - stop IVF today 4/13 and gave low dose lasix 20 mg IV x 1 dose (4/13), will give additional dose of Lasix today 4/14 - monitor weight and strict I's and O's   Hypokalemia - still low  - supplement and repeat BMP in AM   Prior DVT s/p IVC filter - continue Lovenox today per pharmacy    Acute renal failure - appears to be pre renal  - Cr is now WNL   Anemia of acute illness imposed on anemia of chronic disease, malignancy - no signs of active bleeding but Hg still down  - no indication for transfusion at this time  - repeat CBC in AM   Sepsis secondary to the above, L pleural effusion, ? parapneumonic - on 4/11 - WBC 15 K, T 100F, HR in 110 with BP 100/33 - continue ABX as noted above - WBC Is now WNL and sepsis etiology resolving    Pancreatic cancer of tail, s/p distal pancreatectomy, splenectomy, & partial colectomy 04/08/2014 - to follow up with Dr. Barry Dienes and Dr. Burr Medico, will schedule the appointments    Severe PCM - in the context of acute illness - nutritionist consulted, pt tolerating diet well    Urinary retention - despite multiple voiding trails after surgery, she continued to retain urine - foley was continued - has been on Flomax since last admission   Tremors   - on Primidone  Acute functional quadriplegia - in the setting of acute illness - ambulate as pt able to tolerate   DVT prophylaxis - on Lovenox already full dose   Code Status: Full.  Family Communication:  plan of care discussed with the patient Disposition Plan: NOt ready for d/c, pending findings on CXR as pt may need repeat thoracentesis   IV access:  Peripheral IV  Procedures and diagnostic studies:    Ct Abdomen Pelvis Wo Contrast  05/11/2014   Two left upper quadrant surgical drains remain in place and unchanged in position. No significant fluid collection within the left upper quadrant.  Slight interval decrease in size of a fluid collection over  the surgical bed adjacent the distal pancreatectomy measuring 2.1 x 2.7 cm (previously 2.7 x 3 cm).  Interval improvement small amount of free pelvic fluid.  Stable stranding of the mesenteric fat adjacent the colonic anastomosis in the left mid abdomen likely postsurgical change.  Other stable chronic changes within the abdomen.  Continued moderate left pleural effusion with associated compressive atelectasis. Tiny right pleural fluid collection with associated atelectasis.      Dg Chest 1 View   05/10/2014   Left effusion smaller following thoracentesis. There is a small left effusion with left base atelectasis. Subpulmonic chest tube remains on the left as does a drain in the upper abdomen on the left. Right lung is clear. No change in cardiac silhouette. No demonstrable pneumothorax.     Dg Chest 2 View  05/09/2014   Progressed left pleural effusion, now moderate to large. Associated decreased ventilation in the left lung. 2. Lower lung volumes.     Ct Angio Chest Pe W/cm &/or Wo Cm  05/09/2014   No evidence of pulmonary embolus.  Moderate left pleural effusion is noted with associated atelectasis of the left lower lobe.  Continued presence of drainage catheter seen between the stomach and left kidney, as well as continued and stable appearance of fluid collection medial to proximal stomach.   Dg Abd Acute W/chest  05/08/2014  Nonobstructive bowel gas pattern. Left pleural effusion  Similar position of left upper quadrant surgical drains.    US Thoracentesis Asp Pleural Space W/img Guide 05/10/2014  Successful ultrasound-guided left sided thoracentesis yielding 500 ml of pleural fluid.    Medical Consultants:  PCCM IR  Other Consultants:  PT/OT  IAnti-Infectives:   Zosyn 4/10 --> Cipro 4/10 -->  Faye Ramsay, MD  [name] Pager [phone]  If 7PM-7AM, please contact night-coverage [email or web address] Password [facility] 05/13/2014, 6:06 PM   LOS: 4 days   HPI/Subjective: Pt still with abd pain  and left sided chest pain, weakness and unable to ambulate.   Objective: Filed Vitals:   05/12/14 2203 05/13/14 0619 05/13/14 1209 05/13/14 1407  BP: 123/52 127/59 145/76 127/66  Pulse: 100 111 107 111  Temp: 98.2 F (36.8 C) 97.4 F (36.3 C)  99.1 F (37.3 C)  TempSrc: Oral Oral  Oral  Resp: 20 20  20   Height:      Weight:  92.253 kg (203 lb 6.1 oz)    SpO2: 97% 97%  98%    Intake/Output Summary (Last 24 hours) at 05/13/14 1806 Last data filed at 05/13/14 1401  Gross per 24 hour  Intake     50 ml  Output    389 ml  Net   -339 ml    Exam:   General:  Pt is tired but looks better this AM  Cardiovascular: Regular rhythm, tachycardic, S1/S2, no rubs,  no gallops  Respiratory: Diminished air movement on the left side, no crackles   Abdomen: Soft, non tender in epigastric area, non distended, bowel sounds present  Extremities: pulses DP and PT palpable bilaterally  Data Reviewed: Basic Metabolic Panel:  Recent Labs Lab 05/09/14 0721 05/10/14 0350 05/11/14 0345 05/12/14 0335 05/13/14 0523  NA 141 139 139 137 138  K 3.7 3.3* 3.6 3.0* 3.2*  CL 102 103 107 106 107  CO2  --  24 22 23 23   GLUCOSE 140* 111* 115* 99 97  BUN 6 15 18 13 11   CREATININE 1.10 1.37* 1.42* 1.10 1.07  CALCIUM  --  8.7 8.5 8.0* 8.3*   Liver Function Tests:  Recent Labs Lab 05/09/14 0655 05/10/14 0350  AST 38*  --   ALT 17  --   ALKPHOS 78  --   BILITOT 0.5  --   PROT 7.6 6.7  ALBUMIN 3.8  --     Recent Labs Lab 05/09/14 0655  LIPASE 85*   CBC:  Recent Labs Lab 05/09/14 0655 05/09/14 0721 05/10/14 0350 05/11/14 0345 05/12/14 0335  WBC 12.5*  --  15.4* 11.2* 9.2  HGB 10.3* 12.6 9.1* 9.1* 8.3*  HCT 34.2* 37.0 29.2* 29.8* 26.7*  MCV 97.2  --  96.7 96.4 95.0  PLT 476*  --  369 355 351    Recent Results (from the past 240 hour(s))  MRSA PCR Screening     Status: None   Collection Time: 05/09/14 12:27 PM  Result Value Ref Range Status   MRSA by PCR NEGATIVE NEGATIVE  Final  Body fluid culture     Status: None (Preliminary result)   Collection Time: 05/10/14  2:01 PM  Result Value Ref Range Status   Specimen Description PLEURAL  Final   Special Requests NONE  Final   Gram Stain   Final    FEW WBC PRESENT,BOTH PMN AND MONONUCLEAR NO ORGANISMS SEEN    Culture   Final    NO GROWTH 1 DAY Performed at [facility]    Report Status PENDING  Incomplete     Scheduled Meds: . ciprofloxacin  400 mg Intravenous Q12H  . diltiazem  30 mg Oral 4 times per day  . enoxaparin  injection  135 mg Subcutaneous Q24H  . latanoprost  1 drop Both Eyes QHS  . pantoprazole  40 mg Oral BID AC  . ZOSYN  IV  3.375 g Intravenous Q8H  . primidone  50 mg Oral QHS  . sucralfate  1 g Oral TID WC & HS  . tamsulosin  0.4 mg Oral Daily    Continuous Infusions:

## 2014-05-14 ENCOUNTER — Inpatient Hospital Stay (HOSPITAL_COMMUNITY): Payer: Medicare Other

## 2014-05-14 LAB — CBC
HEMATOCRIT: 28.7 % — AB (ref 36.0–46.0)
Hemoglobin: 8.8 g/dL — ABNORMAL LOW (ref 12.0–15.0)
MCH: 29 pg (ref 26.0–34.0)
MCHC: 30.7 g/dL (ref 30.0–36.0)
MCV: 94.7 fL (ref 78.0–100.0)
Platelets: 431 10*3/uL — ABNORMAL HIGH (ref 150–400)
RBC: 3.03 MIL/uL — ABNORMAL LOW (ref 3.87–5.11)
RDW: 16.5 % — AB (ref 11.5–15.5)
WBC: 7.3 10*3/uL (ref 4.0–10.5)

## 2014-05-14 LAB — BASIC METABOLIC PANEL
Anion gap: 10 (ref 5–15)
BUN: 8 mg/dL (ref 6–23)
CO2: 24 mmol/L (ref 19–32)
Calcium: 8.4 mg/dL (ref 8.4–10.5)
Chloride: 103 mmol/L (ref 96–112)
Creatinine, Ser: 0.99 mg/dL (ref 0.50–1.10)
GFR calc Af Amer: 67 mL/min — ABNORMAL LOW (ref >90)
GFR, EST NON AFRICAN AMERICAN: 58 mL/min — AB (ref >90)
Glucose, Bld: 104 mg/dL — ABNORMAL HIGH (ref 70–99)
Potassium: 3.2 mmol/L — ABNORMAL LOW (ref 3.5–5.1)
SODIUM: 137 mmol/L (ref 135–145)

## 2014-05-14 LAB — BODY FLUID CULTURE: Culture: NO GROWTH

## 2014-05-14 MED ORDER — FUROSEMIDE 10 MG/ML IJ SOLN
20.0000 mg | Freq: Every day | INTRAMUSCULAR | Status: DC
  Administered 2014-05-14 – 2014-05-15 (×2): 20 mg via INTRAVENOUS
  Filled 2014-05-14 (×2): qty 2

## 2014-05-14 MED ORDER — POTASSIUM CHLORIDE CRYS ER 20 MEQ PO TBCR
40.0000 meq | EXTENDED_RELEASE_TABLET | Freq: Two times a day (BID) | ORAL | Status: AC
  Administered 2014-05-14 (×2): 40 meq via ORAL
  Filled 2014-05-14 (×2): qty 2

## 2014-05-14 NOTE — Progress Notes (Signed)
Patient ID: [name], female   DOB: [birth year], 69 y.o.   MRN: [id number]    Subjective: Pain and nausea stable.  Still having some shortness of breathing.  Had some tachycardia last night that improved with metoprolol.    Objective: Vital signs in last 24 hours: Temp:  [98.7 F (37.1 C)-99.9 F (37.7 C)] 98.7 F (37.1 C) (04/15 0449) Pulse Rate:  [107-121] 111 (04/15 0449) Resp:  [18-20] 18 (04/15 0449) BP: (101-145)/(52-76) 101/52 mmHg (04/15 0449) SpO2:  [95 %-98 %] 96 % (04/15 0449) Weight:  [87.8 kg (193 lb 9 oz)] 87.8 kg (193 lb 9 oz) (04/15 0449) Last BM Date: 05/14/14  Intake/Output from previous day: 04/14 0701 - 04/15 0700 In: 620 [P.O.:50; IV Piggyback:550] Out: 2432 [Urine:2400; Drains:32] Intake/Output this shift:    General appearance: alert, cooperative and mild distress Resp: breathing comfortably GI: soft, non tender except at drain sites.    Lab Results:   Recent Labs  05/12/14 0335 05/14/14 0512  WBC 9.2 7.3  HGB 8.3* 8.8*  HCT 26.7* 28.7*  PLT 351 431*   BMET  Recent Labs  05/13/14 0523 05/14/14 0512  NA 138 137  K 3.2* 3.2*  CL 107 103  CO2 23 24  GLUCOSE 97 104*  BUN 11 8  CREATININE 1.07 0.99  CALCIUM 8.3* 8.4   PT/INR No results for input(s): LABPROT, INR in the last 72 hours. ABG No results for input(s): PHART, HCO3 in the last 72 hours.  Invalid input(s): PCO2, PO2  Studies/Results: Dg Chest 2 View  05/13/2014   CLINICAL DATA:  Pancreatic cancer, shortness of breath, pleural effusions, weakness  EXAM: CHEST  2 VIEW  COMPARISON:  05/10/2014  FINDINGS: Pigtail drainage catheter LEFT upper quadrant.  Enlargement of cardiac silhouette.  Atherosclerotic calcification aorta.  Mediastinal contours and pulmonary vascularity otherwise normal.  Increased LEFT pleural effusion and basilar atelectasis since previous exam.  Remaining lungs clear.  No pneumothorax.  Bones demineralized.  IMPRESSION: Increased LEFT pleural effusion and  basilar atelectasis.  Enlargement of cardiac silhouette.   Electronically Signed   By: Lavonia Dana M.D.   On: 05/13/2014 14:11    Anti-infectives: Anti-infectives    Start     Dose/Rate Route Frequency Ordered Stop   05/10/14 2000  ciprofloxacin (CIPRO) IVPB 400 mg     400 mg 200 mL/hr over 60 Minutes Intravenous Every 12 hours 05/10/14 1758     05/10/14 1800  piperacillin-tazobactam (ZOSYN) IVPB 3.375 g     3.375 g 12.5 mL/hr over 240 Minutes Intravenous Every 8 hours 05/10/14 1743     05/10/14 1100  doxycycline (VIBRA-TABS) tablet 100 mg  Status:  Discontinued     100 mg Oral 2 times daily 05/10/14 1008 05/11/14 2251      Assessment/Plan: s/p * No surgery found * S/p distal pancreatectomy/partial nephrectomy/partial colectomy for pancreatic cancer. Here for pleural effusion and chest pain.    Continue antibiotics and drainage.  - cultures from pleural effusion negative. Continue diet as tolerated.  Would plan sinogram via drains next week whether inpatient or outpatient.    LOS: 5 days    [facility] 05/14/2014

## 2014-05-14 NOTE — Progress Notes (Signed)
CSW spoke with patient's daughter, Caitrin (ph#: [phone]) re: SNF decision - patient was admitted from [facility], though is checking out other facilities. Daughter to call CSW once decision has been made. Anticipating discharge Monday.   Clinical Social Work Department CLINICAL SOCIAL WORK PLACEMENT NOTE 05/14/2014  Patient:  JADENCE, KINLAW  Account Number:  [id number] [place] date:  05/09/2014  Clinical Social Worker:  Maryln Manuel  Date/time:  05/11/2014 12:15 PM  Clinical Social Work is seeking post-discharge placement for this patient at the following level of care:   SKILLED NURSING   (*CSW will update this form in Epic as items are completed)   05/11/2014  Patient/family provided with [facility] Department of Clinical Social Work's list of facilities offering this level of care within the geographic area requested by the patient (or if unable, by the patient's family).  05/11/2014  Patient/family informed of their freedom to choose among providers that offer the needed level of care, that participate in Medicare, Medicaid or managed care program needed by the patient, have an available bed and are willing to accept the patient.  05/11/2014  Patient/family informed of [facility]' ownership interest in [facility], as well as of the fact that they are under no obligation to receive care at this facility.  PASARR submitted to EDS on 05/11/2014 PASARR number received on 05/11/2014  FL2 transmitted to all facilities in geographic area requested by pt/family on  05/11/2014 FL2 transmitted to all facilities within larger geographic area on   Patient informed that his/her managed care company has contracts with or will negotiate with  certain facilities, including the following:     Patient/family informed of bed offers received:  05/12/2014 Patient chooses bed at  Physician recommends and patient chooses bed at    Patient to be transferred  to  on   Patient to be transferred to facility by  Patient and family notified of transfer on  Name of family member notified:    The following physician request were entered in Epic:   Additional Comments:    Raynaldo Opitz, [place] Social Worker cell #: [phone]

## 2014-05-14 NOTE — Progress Notes (Signed)
NUTRITION FOLLOW-UP  DOCUMENTATION CODES Per approved criteria  -Not Applicable   INTERVENTION: Advance back to Cardiac diet within 12 hours -unmet previously, diet advanced this afternoon  Will d/c Resource Breeze po BID, each supplement provides 250 kcal and 9 grams  Will trial Magic cup q day, each supplement provides 290 kcal and 9 grams of protein   RD to continue to monitor for needs   NUTRITION DIAGNOSIS: Increased nutrient (protein) needs related to biological demands, medical course as evidenced by pancreatic cancer with surgical intervention 1 month ago. -ongoing  Goal: Pt to meet >/=90% estimated needs with diet re-advancement -unmet  Monitor:  Iintakes of meals, acceptance and intake of supplements, weight trends, labs, I/O's   69 y.o. female  Admitting Dx: Pleural effusion  ASSESSMENT: 69 y.o. female with hx of pancreatic cancer who underweight partial pancreatectomy, splenectomy, and partial colectomy 04/08/14. Pt was having vomiting for 24 hours PTA and Carafate order in place.  Pt was on FLD since 4/11 and diet advanced in time for lunch today. She ordered banana pudding and sweet potato casserole for lunch and it was arriving when RD left the room. Pt reports that she has not had a good appetite on FLD and this is shown with chart review showing intakes of 0-25% since last assessment.  Pt does not like [employer] and requests it to be d/c'ed. Will trial Magic Cup to trial. Unable to obtain further information at this time as visitors stepped into the room and pt not paying attention to RD.  Not meeting needs. Labs reviewed; K 3.2 mmol/L, GFR: 58.  Physical assessment done this visit as unable on previous visit. No signs of wasting at this time.   Height: Ht Readings from Last 1 Encounters:  05/12/14 5' 9.5" (1.765 m)    Weight: Wt Readings from Last 1 Encounters:  05/14/14 193 lb 9 oz (87.8 kg)    Ideal Body Weight: 147.5 lbs (67 kg) % Ideal  Body Weight: 132%  Wt Readings from Last 10 Encounters:  05/14/14 193 lb 9 oz (87.8 kg)  05/06/14 201 lb (91.173 kg)  05/05/14 204 lb 12 oz (92.874 kg)  05/04/14 204 lb 12.9 oz (92.9 kg)  04/01/14 202 lb (91.627 kg)  03/22/14 210 lb 6.4 oz (95.437 kg)  03/19/14 212 lb 12.8 oz (96.525 kg)  02/26/14 216 lb (97.977 kg)  02/16/14 216 lb 12.8 oz (98.34 kg)  01/04/14 218 lb (98.884 kg)    Usual Body Weight: pt reports UBW of 225 lbs (102.27 kg)  % Usual Body Weight: 87%  BMI:  Body mass index is 28.18 kg/(m^2).  Estimated Nutritional Needs: Kcal: 1700-2000  Protein: 130-150 grams given cancer and recent surgery Fluid: >2L/day  Skin: documented as WDL  Diet Order: Diet regular Room service appropriate?: Yes; Fluid consistency:: Thin  EDUCATION NEEDS: -No education needs identified at this time   Intake/Output Summary (Last 24 hours) at 05/14/14 1303 Last data filed at 05/14/14 0559  Gross per 24 hour  Intake    320 ml  Output   2432 ml  Net  -2112 ml    Last BM: 4/13  Labs:   Recent Labs Lab 05/12/14 0335 05/13/14 0523 05/14/14 0512  NA 137 138 137  K 3.0* 3.2* 3.2*  CL 106 107 103  CO2 23 23 24   BUN 13 11 8   CREATININE 1.10 1.07 0.99  CALCIUM 8.0* 8.3* 8.4  GLUCOSE 99 97 104*    CBG (last 3)  No  results for input(s): GLUCAP in the last 72 hours.  Scheduled Meds: . ciprofloxacin  400 mg Intravenous Q12H  . diltiazem  30 mg Oral 4 times per day  . enoxaparin (LOVENOX) injection  135 mg Subcutaneous Q24H  . feeding supplement (RESOURCE BREEZE)  1 Container Oral BID BM  . furosemide  20 mg Intravenous Daily  . latanoprost  1 drop Both Eyes QHS  . pantoprazole  40 mg Oral BID AC  . piperacillin-tazobactam (ZOSYN)  IV  3.375 g Intravenous Q8H  . potassium chloride  40 mEq Oral BID  . primidone  50 mg Oral QHS  . sodium chloride  3 mL Intravenous Q12H  . sucralfate  1 g Oral TID WC & HS  . tamsulosin  0.4 mg Oral Daily    Continuous Infusions:     Past Medical History  Diagnosis Date  . Glaucoma   . Clotting disorder   . DVT (deep venous thrombosis)     LEFT  . GERD (gastroesophageal reflux disease)   . Hypercholesterolemia   . Peripheral neuropathy   . Essential tremor   . Diverticulosis   . Arthritis     knees and back  . Cancer     Pancreatic  . Hypertension   . Colon polyps   . UTI (lower urinary tract infection)   . Complication of anesthesia     slow to wak up   . Pulmonary embolism     Past Surgical History  Procedure Laterality Date  . Cholecystectomy    . Abdominal hysterectomy    . Knee surgery Bilateral   . [place] filter    . Back surgery    . Eus N/A 02/26/2014    Procedure: UPPER ENDOSCOPIC ULTRASOUND (EUS) LINEAR;  Surgeon: Beryle Beams, MD;  Location: [facility];  Service: Endoscopy;  Laterality: N/A;  . Fine needle aspiration N/A 02/26/2014    Procedure: FINE NEEDLE ASPIRATION (FNA) LINEAR;  Surgeon: Beryle Beams, MD;  Location: [facility];  Service: Endoscopy;  Laterality: N/A;  . Breast lumpectomy Right   . Spine surgery    . Xi robotic assisted laparoscopic distal pancreatectomy N/A 04/08/2014    Procedure: XI ROBOTIC ASSISTED CONVERTED LAPAROSCOPIC HAND ASSITED DISTAL PANCREATECTOMY/SPLENECTOMY WITH PARTIAL COLECTOMY;  Surgeon: [name], MD;  Location: [facility];  Service: General;  Laterality: N/A;  . Colon surgery    . Diagnostic laparoscopy      Jarome Matin, RD, LDN Inpatient Clinical Dietitian Pager # [phone] After hours/weekend pager # [phone]

## 2014-05-14 NOTE — Progress Notes (Signed)
Patient ID: [name], female   DOB: 18-Mar-1945, 69 y.o.   MRN: [id number]  [facility] PROGRESS NOTE  [name] GDJ:[id number] DOB: 06-09-1945 DOA: 05/09/2014 PCP: Mauricio Po, FNP   Brief narrative:    69 y.o. 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 She has followed with Dr Burr Medico with plan is to start chemo soon. Pt presented with left sided chest pain and upper abd pain and was found to have moderate sized pleural effusion requiring IR guided left thoracentesis 4/11 with removal of 500 cc of fluid.   Major events since admission: 4/15 - CXR with worsening pleural effusion, IR re consulted for consideration of repeat thoracentesis   Assessment/Plan:    Principal Problem:   Left chest/ abdominal pain - partly due to drains and possibly due to effusion, ? parapneumonic effusion related to recent LUQ abscess  - now status post left thoracentesis 4/11 with 500 cc fluid removed, exudate  - appreciate IR and PCCM input - continue current ABX Zosyn and Cipro to cover for pseudomonas until final culture reports are back  - repeat CXR notable for worsening pleural effusion, will ask IR to see if pt would benefit from repeat thoracentesis    Moderate to large acute left pleural effusion - malignancy still not excluded but possibly related to extension of the abd abscess - breath still diminished on the left side  - CXR with worsening effusion, will see with IR if repeat thoracentesis needed    LUQ abscess with recent cultures growing Pseudomonas - status post drainage on 3/18 - WBC continue trending down and is WNL this am - repeat CT abd with decreasing size of the abscess  -  continuing Zosyn and Cipro as noted above per surgery team recommendations    Slight volume overload - weight is up since admission from 190 to 195 lbs on 4/12 --> 203 lbs --> 193 lbs this AM - stopped IVF 4/13 and gave low dose lasix 20 mg IV x 1 dose (4/13), gave additional dose of Lasix today 4/14 - continue Lasix daily and monitor clinical response  - monitor weight and strict I's and O's   Hypokalemia - still low  - will continue to supplement and repeat BMP in AM   Prior DVT s/p IVC filter - continue Lovenox  per pharmacy    Acute renal failure - appears to be pre renal  - Cr is now WNL   Anemia of acute illness imposed on anemia of chronic disease, malignancy - no signs of active bleeding, Hg remains stable over the past 24 hours  - no indication for transfusion at this time  - repeat CBC in AM   Sepsis secondary to the above, L pleural effusion, ? parapneumonic - on 4/11 - WBC 15 K, T 100F, HR in 110 with BP 100/33 - continue ABX as noted above - WBC Is now WNL and sepsis etiology resolving    Pancreatic cancer of tail, s/p distal pancreatectomy, splenectomy, & partial colectomy 04/08/2014 - to follow up with Dr. Barry Dienes and Dr. Burr Medico, will schedule the appointments prior to discharge    Severe PCM - in the context of acute illness - nutritionist consulted, pt tolerating diet well  Urinary retention - despite multiple voiding trails after surgery, she continued to retain urine - foley continued - has been on Flomax since last admission   Tremors   - on Primidone   Acute functional quadriplegia - in the setting of acute illness - pt sitting in chair today, encouraged ambulation with assistance if able to tolerate   DVT prophylaxis - on Lovenox already full dose   Code Status: Full.  Family Communication:  plan of care discussed with the patient Disposition Plan: Not ready for d/c, still with large pleural effusion, may need repeat thoracentesis   IV access:   Peripheral IV  Procedures and diagnostic studies:    Ct Abdomen Pelvis Wo Contrast  05/11/2014   Two left upper quadrant surgical drains remain in place and unchanged in position. No significant fluid collection within the left upper quadrant.  Slight interval decrease in size of a fluid collection over the surgical bed adjacent the distal pancreatectomy measuring 2.1 x 2.7 cm (previously 2.7 x 3 cm).  Interval improvement small amount of free pelvic fluid.  Stable stranding of the mesenteric fat adjacent the colonic anastomosis in the left mid abdomen likely postsurgical change.  Other stable chronic changes within the abdomen.  Continued moderate left pleural effusion with associated compressive atelectasis. Tiny right pleural fluid collection with associated atelectasis.      Dg Chest 1 View   05/10/2014   Left effusion smaller following thoracentesis. There is a small left effusion with left base atelectasis. Subpulmonic chest tube remains on the left as does a drain in the upper abdomen on the left. Right lung is clear. No change in cardiac silhouette. No demonstrable pneumothorax.     Dg Chest 2 View  05/09/2014   Progressed left pleural effusion, now moderate to large. Associated decreased ventilation in the left lung. 2. Lower lung volumes.     Ct Angio Chest Pe W/cm &/or Wo Cm  05/09/2014   No evidence of pulmonary embolus.  Moderate left pleural effusion is noted with associated atelectasis of the left lower lobe.  Continued presence of drainage catheter seen between the stomach and left kidney, as well as continued and stable appearance of fluid collection medial to proximal stomach.   Dg Abd Acute W/chest  05/08/2014  Nonobstructive bowel gas pattern. Left pleural effusion  Similar position of left upper quadrant surgical drains.    US Thoracentesis Asp Pleural Space W/img Guide 05/10/2014  Successful ultrasound-guided left sided thoracentesis yielding 500 ml of pleural fluid.    Medical  Consultants:  PCCM IR  Other Consultants:  PT/OT  IAnti-Infectives:   Zosyn 4/10 --> Cipro 4/10 -->  Faye Ramsay, MD  [facility] Pager [phone]  If 7PM-7AM, please contact night-coverage [email or web address] Password [email or web address] 05/14/2014, 2:13 PM   LOS: 5 days   HPI/Subjective: Pt still with abd pain and left sided chest pain.  Objective: Filed Vitals:   05/13/14 1407 05/13/14 2040 05/14/14 0035 05/14/14 0449  BP: 127/66 134/62  101/52  Pulse: 111 120 121 111  Temp: 99.1 F (37.3 C) 99.9 F (37.7 C)  98.7 F (37.1 C)  TempSrc: Oral Oral  Oral  Resp: 20 20  18   Height:      Weight:    87.8 kg (193 lb 9 oz)  SpO2: 98% 95%  96%    Intake/Output Summary (Last 24 hours) at 05/14/14 1413 Last data filed at 05/14/14 1334  Gross per 24 hour  Intake   1170 ml  Output  3267 ml  Net  -2097 ml    Exam:   General:  Pt is tired but looks better this AM  Cardiovascular: Regular rhythm, tachycardic, S1/S2, no rubs, no gallops  Respiratory: Diminished air movement on the left side, no crackles   Abdomen: Soft, non tender in epigastric area, non distended, bowel sounds present  Extremities: pulses DP and PT palpable bilaterally  Data Reviewed: Basic Metabolic Panel:  Recent Labs Lab 05/10/14 0350 05/11/14 0345 05/12/14 0335 05/13/14 0523 05/14/14 0512  NA 139 139 137 138 137  K 3.3* 3.6 3.0* 3.2* 3.2*  CL 103 107 106 107 103  CO2 24 22 23 23 24   GLUCOSE 111* 115* 99 97 104*  BUN 15 18 13 11 8   CREATININE 1.37* 1.42* 1.10 1.07 0.99  CALCIUM 8.7 8.5 8.0* 8.3* 8.4   Liver Function Tests:  Recent Labs Lab 05/09/14 0655 05/10/14 0350  AST 38*  --   ALT 17  --   ALKPHOS 78  --   BILITOT 0.5  --   PROT 7.6 6.7  ALBUMIN 3.8  --     Recent Labs Lab 05/09/14 0655  LIPASE 85*   CBC:  Recent Labs Lab 05/09/14 0655 05/09/14 0721 05/10/14 0350 05/11/14 0345 05/12/14 0335 05/14/14 0512  WBC 12.5*  --  15.4* 11.2* 9.2 7.3  HGB 10.3* 12.6 9.1* 9.1*  8.3* 8.8*  HCT 34.2* 37.0 29.2* 29.8* 26.7* 28.7*  MCV 97.2  --  96.7 96.4 95.0 94.7  PLT 476*  --  369 355 351 431*    Recent Results (from the past 240 hour(s))  MRSA PCR Screening     Status: None   Collection Time: 05/09/14 12:27 PM  Result Value Ref Range Status   MRSA by PCR NEGATIVE NEGATIVE Final  Body fluid culture     Status: None (Preliminary result)   Collection Time: 05/10/14  2:01 PM  Result Value Ref Range Status   Specimen Description PLEURAL  Final   Special Requests NONE  Final   Gram Stain   Final    FEW WBC PRESENT,BOTH PMN AND MONONUCLEAR NO ORGANISMS SEEN    Culture   Final    NO GROWTH 1 DAY Performed at [facility]    Report Status PENDING  Incomplete     Scheduled Meds: . ciprofloxacin  400 mg Intravenous Q12H  . diltiazem  30 mg Oral 4 times per day  . enoxaparin  injection  135 mg Subcutaneous Q24H  . latanoprost  1 drop Both Eyes QHS  . pantoprazole  40 mg Oral BID AC  . ZOSYN  IV  3.375 g Intravenous Q8H  . primidone  50 mg Oral QHS  . sucralfate  1 g Oral TID WC & HS  . tamsulosin  0.4 mg Oral Daily    Continuous Infusions:

## 2014-05-14 NOTE — Plan of Care (Signed)
Problem: Phase I Progression Outcomes Goal: Voiding-avoid urinary catheter unless indicated Outcome: Not Met (add Reason) Chronic foley use d/t urinary retention

## 2014-05-14 NOTE — Progress Notes (Signed)
Patient ID: [name], female   DOB: April 13, 1945, 69 y.o.   MRN: [id number]    Referring Physician(s): CCS  Subjective:  Pt still with some left post chest discomfort, dyspnea ; currently undergoing left thoracentesis for recurrent small effusion  Allergies: Sulfa antibiotics  Medications: Prior to Admission medications   Medication Sig Start Date End Date Taking? Authorizing Provider  atorvastatin (LIPITOR) 40 MG tablet Take 1 tablet (40 mg total) by mouth daily. 05/05/14  Yes Gerlene Fee, NP  diltiazem (CARTIA XT) 120 MG 24 hr capsule Take 1 capsule (120 mg total) by mouth daily. 05/05/14  Yes Gerlene Fee, NP  DOXYCYCLINE HYCLATE PO Take 100 mg by mouth 2 (two) times daily.   Yes Historical Provider, MD  enoxaparin (LOVENOX) 120 MG/0.8ML injection Inject 0.8 mLs (120 mg total) into the skin daily. 05/06/14  Yes Truitt Merle, MD  latanoprost (XALATAN) 0.005 % ophthalmic solution Place 1 drop into both eyes at bedtime.   Yes Historical Provider, MD  pantoprazole (PROTONIX) 40 MG tablet TAKE 1 TABLET BY MOUTH DAILY 03/28/14  Yes Leandrew Koyanagi, MD  primidone (MYSOLINE) 50 MG tablet Take 50 mg by mouth at bedtime. Patient takes at nite   Yes Historical Provider, MD  propranolol ER (INDERAL LA) 120 MG 24 hr capsule TAKE ONE CAPSULE BY MOUTH DAILY 03/28/14  Yes Mancel Bale, PA-C  sucralfate (CARAFATE) 1 GM/10ML suspension Take 10 mLs (1 g total) by mouth 4 (four) times daily -  with meals and at bedtime. 05/04/14  Yes [name], MD  tamsulosin (FLOMAX) 0.4 MG CAPS capsule Take 1 capsule (0.4 mg total) by mouth daily. 05/04/14  Yes [name], MD  traMADol (ULTRAM) 50 MG tablet Take 1 tablet (50 mg total) by mouth every 6 (six) hours as needed. 05/04/14  Yes [name], MD  loperamide (IMODIUM) 2 MG capsule Take 1 capsule (2 mg total) by mouth 3 (three) times daily before meals. 05/04/14   [name], MD     Vital Signs: BP 101/52 mmHg  Pulse 111  Temp(Src) 98.7 F (37.1 C)  (Oral)  Resp 18  Ht 5' 9.5" (1.765 m)  Wt 193 lb 9 oz (87.8 kg)  BMI 28.18 kg/m2  SpO2 96%  Physical Exam LUQ drain intact ; insertion site ok, mild-mod tender; output 15 cc's; drain irigated with 5 cc's sterile NS with return of same amt  Imaging: Ct Abdomen Pelvis Wo Contrast  05/11/2014   CLINICAL DATA:  Evaluate for fluid collection. Pancreatic cancer. Previous distal pancreatectomy, splenectomy and partial colectomy.  EXAM: CT ABDOMEN AND PELVIS WITHOUT CONTRAST  TECHNIQUE: Multidetector CT imaging of the abdomen and pelvis was performed following the standard protocol without IV contrast.  COMPARISON:  04/29/2014  FINDINGS: Lung bases demonstrate a tiny amount of right pleural fluid and associated dependent atelectasis. There is a moderate size left pleural fluid collection with associated consolidation likely compressive atelectasis. Mild stable cardiomegaly.  Abdominal images demonstrate no change in positioning of the two left upper quadrant surgical drains. There is slightly less fluid adjacent the pigtail portion of the catheter. There is a slight decrease in size of a fluid collection over the surgical suture line overlying the distal pancreas measuring 2.1 x 2.7 cm (previously 2.7 x 3 cm) with no change in a small focus of air along the anterior aspect of the fluid collection.  Previous cholecystectomy. The liver, adrenal glands and kidneys are otherwise unchanged. IVC filter unchanged. Postsurgical change over the  anterior abdominal wall in the midline over the epigastric region.  Postsurgical change over the left colon as the anastomotic site is patent. There remains mild stranding of the adjacent mesenteric fat adjacent the anastomotic site. The appendix is normal. There is moderate diverticulosis throughout the colon.  Pelvic images demonstrate a Foley catheter within a decompressed bladder. Rectosigmoid colon is normal. There is a small amount of free fluid slightly improved.  Degenerative changes of the spine and hips.  IMPRESSION: Two left upper quadrant surgical drains remain in place and unchanged in position. No significant fluid collection within the left upper quadrant.  Slight interval decrease in size of a fluid collection over the surgical bed adjacent the distal pancreatectomy measuring 2.1 x 2.7 cm (previously 2.7 x 3 cm).  Interval improvement small amount of free pelvic fluid.  Stable stranding of the mesenteric fat adjacent the colonic anastomosis in the left mid abdomen likely postsurgical change.  Other stable chronic changes within the abdomen.  Continued moderate left pleural effusion with associated compressive atelectasis. Tiny right pleural fluid collection with associated atelectasis.   Electronically Signed   By: Marin Olp M.D.   On: 05/11/2014 17:02   Dg Chest 2 View  05/14/2014   CLINICAL DATA:  Followup pleural effusion.  EXAM: CHEST  2 VIEW  COMPARISON:  Chest x-ray 05/13/2014 and CT abdomen 05/11/2014.  FINDINGS: Persistent small left pleural effusion with overlying atelectasis. The right lung remains clear. Stable left upper quadrant drainage catheter.  IMPRESSION: Persistent left pleural effusion with overlying atelectasis.   Electronically Signed   By: Marijo Sanes M.D.   On: 05/14/2014 14:10   Dg Chest 2 View  05/13/2014   CLINICAL DATA:  Pancreatic cancer, shortness of breath, pleural effusions, weakness  EXAM: CHEST  2 VIEW  COMPARISON:  05/10/2014  FINDINGS: Pigtail drainage catheter LEFT upper quadrant.  Enlargement of cardiac silhouette.  Atherosclerotic calcification aorta.  Mediastinal contours and pulmonary vascularity otherwise normal.  Increased LEFT pleural effusion and basilar atelectasis since previous exam.  Remaining lungs clear.  No pneumothorax.  Bones demineralized.  IMPRESSION: Increased LEFT pleural effusion and basilar atelectasis.  Enlargement of cardiac silhouette.   Electronically Signed   By: Lavonia Dana M.D.   On:  05/13/2014 14:11    Labs:  CBC:  Recent Labs  05/10/14 0350 05/11/14 0345 05/12/14 0335 05/14/14 0512  WBC 15.4* 11.2* 9.2 7.3  HGB 9.1* 9.1* 8.3* 8.8*  HCT 29.2* 29.8* 26.7* 28.7*  PLT 369 355 351 431*    COAGS:  Recent Labs  04/01/14 1400 04/08/14 0630 04/09/14 0404 05/09/14 0655  INR 1.11 1.06 1.12 1.08  APTT  --  40* 33  --     BMP:  Recent Labs  05/11/14 0345 05/12/14 0335 05/13/14 0523 05/14/14 0512  NA 139 137 138 137  K 3.6 3.0* 3.2* 3.2*  CL 107 106 107 103  CO2 22 23 23 24   GLUCOSE 115* 99 97 104*  BUN 18 13 11 8   CALCIUM 8.5 8.0* 8.3* 8.4  CREATININE 1.42* 1.10 1.07 0.99  GFRNONAA 37* 51* 52* 58*  GFRAA 43* 59* 61* 67*    LIVER FUNCTION TESTS:  Recent Labs  04/01/14 1400 04/24/14 2039  04/30/14 0450  05/02/14 0615 05/03/14 0440 05/04/14 0415 05/09/14 0655 05/10/14 0350  BILITOT 0.7 0.5  --  0.3  --   --   --   --  0.5  --   AST 35 30  --  29  --   --   --   --  38*  --   ALT 21 13  --  10  --   --   --   --  17  --   ALKPHOS 75 83  --  65  --   --   --   --  78  --   PROT 7.6 6.1  --  6.0  --   --   --   --  7.6 6.7  ALBUMIN 3.9 2.4*  < > 3.0*  < > 3.2* 3.1* 3.2* 3.8  --   < > = values in this interval not displayed.  Assessment and Plan: S/p LUQ abscess drainage 3/18 (post distal pancreatectomy/splenectomy/partial colectomy 04/08/14 for panc ca); pt afebrile; WBC/creat nl; per CCS plans noted for drain injections next week  Signed: Ersel Wadleigh,D KEVIN 05/14/2014, 3:09 PM   I spent a total of 15 minutes  in face to face in clinical consultation/evaluation, greater than 50% of which was counseling/coordinating care for LUQ abscess drainage

## 2014-05-15 DIAGNOSIS — D638 Anemia in other chronic diseases classified elsewhere: Secondary | ICD-10-CM

## 2014-05-15 LAB — MAGNESIUM: MAGNESIUM: 1.3 mg/dL — AB (ref 1.5–2.5)

## 2014-05-15 MED ORDER — POTASSIUM CHLORIDE 10 MEQ/50ML IV SOLN
10.0000 meq | INTRAVENOUS | Status: DC | PRN
  Filled 2014-05-15: qty 50

## 2014-05-15 MED ORDER — LIP MEDEX EX OINT
1.0000 "application " | TOPICAL_OINTMENT | Freq: Two times a day (BID) | CUTANEOUS | Status: DC
  Administered 2014-05-15 – 2014-05-21 (×12): 1 via TOPICAL
  Filled 2014-05-15: qty 7

## 2014-05-15 MED ORDER — ENOXAPARIN SODIUM 150 MG/ML ~~LOC~~ SOLN
1.5000 mg/kg | SUBCUTANEOUS | Status: DC
  Administered 2014-05-15 – 2014-05-21 (×7): 130 mg via SUBCUTANEOUS
  Filled 2014-05-15 (×7): qty 1

## 2014-05-15 MED ORDER — BISMUTH SUBSALICYLATE 262 MG/15ML PO SUSP
30.0000 mL | Freq: Three times a day (TID) | ORAL | Status: DC | PRN
  Filled 2014-05-15: qty 118

## 2014-05-15 MED ORDER — MORPHINE SULFATE 2 MG/ML IJ SOLN
2.0000 mg | INTRAMUSCULAR | Status: DC | PRN
  Administered 2014-05-16 – 2014-05-19 (×8): 2 mg via INTRAVENOUS
  Filled 2014-05-15 (×9): qty 1

## 2014-05-15 MED ORDER — ACETAMINOPHEN 500 MG PO TABS
500.0000 mg | ORAL_TABLET | Freq: Three times a day (TID) | ORAL | Status: DC
  Administered 2014-05-15 – 2014-05-21 (×23): 500 mg via ORAL
  Filled 2014-05-15 (×24): qty 1

## 2014-05-15 MED ORDER — PSYLLIUM 95 % PO PACK
1.0000 | PACK | Freq: Every day | ORAL | Status: DC
  Administered 2014-05-15: 1 via ORAL
  Filled 2014-05-15 (×7): qty 1

## 2014-05-15 MED ORDER — MENTHOL 3 MG MT LOZG
1.0000 | LOZENGE | OROMUCOSAL | Status: DC | PRN
  Filled 2014-05-15: qty 9

## 2014-05-15 MED ORDER — POTASSIUM CHLORIDE CRYS ER 20 MEQ PO TBCR: 20.0000 meq | EXTENDED_RELEASE_TABLET | ORAL | Status: DC | PRN

## 2014-05-15 MED ORDER — SACCHAROMYCES BOULARDII 250 MG PO CAPS
250.0000 mg | ORAL_CAPSULE | Freq: Two times a day (BID) | ORAL | Status: DC
  Administered 2014-05-15 – 2014-05-21 (×13): 250 mg via ORAL
  Filled 2014-05-15 (×13): qty 1

## 2014-05-15 MED ORDER — MAGNESIUM SULFATE 2 GM/50ML IV SOLN
2.0000 g | Freq: Once | INTRAVENOUS | Status: AC
  Administered 2014-05-15: 2 g via INTRAVENOUS
  Filled 2014-05-15: qty 50

## 2014-05-15 MED ORDER — ACETAMINOPHEN 650 MG RE SUPP: 650.0000 mg | Freq: Four times a day (QID) | RECTAL | Status: DC | PRN

## 2014-05-15 MED ORDER — PHENOL 1.4 % MT LIQD
2.0000 | OROMUCOSAL | Status: DC | PRN
  Filled 2014-05-15: qty 177

## 2014-05-15 MED ORDER — ACETAMINOPHEN 325 MG PO TABS: 325.0000 mg | ORAL_TABLET | Freq: Four times a day (QID) | ORAL | Status: DC | PRN

## 2014-05-15 MED ORDER — HYDROCODONE-ACETAMINOPHEN 10-325 MG PO TABS
1.0000 | ORAL_TABLET | Freq: Four times a day (QID) | ORAL | Status: DC | PRN
Start: 2014-05-15 — End: 2014-05-15

## 2014-05-15 MED ORDER — HYDROCODONE-ACETAMINOPHEN 10-325 MG PO TABS
0.5000 | ORAL_TABLET | Freq: Four times a day (QID) | ORAL | Status: DC | PRN
  Administered 2014-05-15: 0.5 via ORAL
  Administered 2014-05-15 – 2014-05-20 (×7): 1 via ORAL
  Filled 2014-05-15 (×8): qty 1

## 2014-05-15 MED ORDER — MAGIC MOUTHWASH
15.0000 mL | Freq: Four times a day (QID) | ORAL | Status: DC | PRN
  Filled 2014-05-15: qty 15

## 2014-05-15 MED ORDER — ALUM & MAG HYDROXIDE-SIMETH 200-200-20 MG/5ML PO SUSP: 30.0000 mL | Freq: Four times a day (QID) | ORAL | Status: DC | PRN

## 2014-05-15 MED ORDER — MAGNESIUM SULFATE 2 GM/50ML IV SOLN: 2.0000 g | Freq: Once | INTRAVENOUS | Status: DC

## 2014-05-15 NOTE — Progress Notes (Signed)
Pt's foley removed per MD order around 11:30, no void by 6pm despite ambulating to BR and allowing to sit on toilet. Bladder scan revealed >999 cc. In & out cath returned 1000cc yellow urine with sediment. Saw history of retention, pt requests for foley not to be reinserted yet, wants to see if she can eventually void on her own.   Pt sat up in chair today and ambulated in room. Walks well with her walker. Tolerating PO pain meds.   Abscess drain 1: draining white drainage well. Only able to irrigate with 8cc around 2p due to pain. Speciman sent from drain 1 for amylase. Abscess drain 2: no drainage today and unable to irrigate despite milking of tube as ordered.Speciman from drain 2 has not been sent.  Pt's heart rate sustained in the 130's for around 53mins without any prior activity. 5mg  of Lopressor IV given per prn orders. Heart rate decreased to lower 100's.   Printed education for a high protein diet and encouraged pt to eat.  Kanya Potteiger, Julieta Gutting RN

## 2014-05-15 NOTE — Progress Notes (Signed)
ANTICOAGULATION CONSULT NOTE - Follow Up  Pharmacy Consult for Lovenox Indication: hx PE and recurrent DVTs  Allergies  Allergen Reactions  . Sulfa Antibiotics Swelling    Patient Measurements: Height: 5' 9.5" (176.5 cm) Weight: 191 lb 4.8 oz (86.773 kg) IBW/kg (Calculated) : 67.35  Vital Signs: Temp: 98.4 F (36.9 C) (04/16 0527) Temp Source: Oral (04/16 0527) BP: 118/64 mmHg (04/16 0527) Pulse Rate: 106 (04/16 0527)  Labs:  Recent Labs  05/13/14 0523 05/14/14 0512  HGB  --  8.8*  HCT  --  28.7*  PLT  --  431*  CREATININE 1.07 0.99    Estimated Creatinine Clearance: 65.5 mL/min (by C-G formula based on Cr of 0.99).  Assessment: 19 YOF presented 05/08/14 with lower left-sided abdominal pain s/p complex abdominal surgery (pancreas, spleen, colon, & kidney). 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 Lovenox transitioned to heparin on admit in anticipation of thoracentesis due to chest x-ray confirmation of pleural effusion on 05/09/14. Chest CT angio on 4/10 negative for new PE. S/p thoracentesis on 05/10/14. With no further surgical interventions planned, transitioned patient back to Lovenox 4/13.  Today, 05/15/2014   Weight trended back down. Appears may have increased a few days ago due to positive net I/O.  Will make slight adjustment to Lovenox weight based dose.  CBC: Hgb low but stable.  SCr 0.99 No bleeding/complications reported.   Goal of Therapy:  Appropriate anticoagulation for history of DVT    Plan:  1.  Adjust Lovenox slightly to 130 mg (1.5 mg/kg) SQ q24h. 2.  Continue to monitor CBC at least q72h.  Hershal Coria, PharmD, BCPS Pager: [phone] 05/15/2014 11:24 AM

## 2014-05-15 NOTE — Progress Notes (Signed)
Patient ID: [name], female   DOB: 1945-08-09, 69 y.o.   MRN: [id number]  [facility] PROGRESS NOTE  LOYCE FLAMING LJQ:[id number] DOB: [birth year] DOA: 05/09/2014 PCP: Mauricio Po, FNP   Brief narrative:    69 y.o. 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 She has followed with Dr Burr Medico with plan is to start chemo soon. Pt presented with left sided chest pain and upper abd pain and was found to have moderate sized pleural effusion requiring IR guided left thoracentesis 4/11 with removal of 500 cc of fluid.   Major events since admission: 4/15 - CXR with worsening pleural effusion, IR re consulted for consideration of repeat thoracentesis   Assessment/Plan:    Principal Problem:   Left chest/ abdominal pain - partly due to drains and possibly due to effusion, ? parapneumonic effusion related to recent LUQ abscess  - now status post left thoracentesis 4/11 with 500 cc fluid removed, exudate  - appreciate IR and PCCM input - continue current ABX Zosyn and Cipro to cover for pseudomonas, will consult ID for further rec's on ABX - repeat CXR notable for worsening pleural effusion, pt is clinically stable this AM will less dyspnea    Moderate to large acute left pleural effusion - possibly related to extension of the abd abscess - breath still diminished on the left side  - CXR with worsening effusion, will see with IR if repeat thoracentesis needed    LUQ abscess with recent cultures growing Pseudomonas - status post drainage on 3/18 - WBC continue trending down and is WNL this am - repeat CT abd with decreasing size of the abscess  - continuing Zosyn and Cipro as noted above per surgery  team recommendations - will ask ID for recommendations    Slight volume overload - weight is up since admission from 190 to 195 lbs on 4/12 --> 203 lbs --> 193 lbs --> 191 lbs this AM - stopped IVF 4/13 and gave low dose lasix 20 mg IV x 1 dose (4/13), gave additional dose of Lasix  4/14 and 4/15 - monitor weight and strict I's and O's   Hypokalemia - still low with low Mg level - will continue to supplement and repeat BMP in AM   Prior DVT s/p IVC filter - continue Lovenox  per pharmacy    Acute renal failure - appears to be pre renal  - Cr is now WNL   Anemia of acute illness imposed on anemia of chronic disease, malignancy - no signs of active bleeding, Hg remains stable over the past 24 hours  - no indication for transfusion at this time  - repeat CBC in AM   Sepsis secondary to the above, L pleural effusion, ? parapneumonic - on 4/11 - WBC 15 K, T 100F, HR in 110 with BP 100/33 - continue ABX as noted above - WBC Is now WNL and sepsis etiology resolving    Pancreatic cancer of tail, s/p distal pancreatectomy, splenectomy, & partial colectomy 04/08/2014 - to follow up with Dr. Barry Dienes and Dr. Burr Medico, will schedule the appointments prior to discharge    Severe PCM - in the context of acute illness - nutritionist consulted, pt tolerating diet well  Urinary retention - despite multiple voiding trails after surgery, she continued to retain urine - will try to take out foley out today with voiding trial  - has been on Flomax since last admission   Tremors   - on Primidone   Acute functional quadriplegia - in the setting of acute illness - pt sitting in chair today, encouraged ambulation with assistance if able to tolerate   DVT prophylaxis - on Lovenox already full dose   Code Status: Full.  Family Communication:  plan of care discussed with the patient Disposition Plan: Not ready for d/c, still with large pleural effusion, may need repeat thoracentesis   IV access:   Peripheral IV  Procedures and diagnostic studies:    Ct Abdomen Pelvis Wo Contrast  05/11/2014   Two left upper quadrant surgical drains remain in place and unchanged in position. No significant fluid collection within the left upper quadrant.  Slight interval decrease in size of a fluid collection over the surgical bed adjacent the distal pancreatectomy measuring 2.1 x 2.7 cm (previously 2.7 x 3 cm).  Interval improvement small amount of free pelvic fluid.  Stable stranding of the mesenteric fat adjacent the colonic anastomosis in the left mid abdomen likely postsurgical change.  Other stable chronic changes within the abdomen.  Continued moderate left pleural effusion with associated compressive atelectasis. Tiny right pleural fluid collection with associated atelectasis.      Dg Chest 1 View   05/10/2014   Left effusion smaller following thoracentesis. There is a small left effusion with left base atelectasis. Subpulmonic chest tube remains on the left as does a drain in the upper abdomen on the left. Right lung is clear. No change in cardiac silhouette. No demonstrable pneumothorax.     Dg Chest 2 View  05/09/2014   Progressed left pleural effusion, now moderate to large. Associated decreased ventilation in the left lung. 2. Lower lung volumes.     Ct Angio Chest Pe W/cm &/or Wo Cm  05/09/2014   No evidence of pulmonary embolus.  Moderate left pleural effusion is noted with associated atelectasis of the left lower lobe.  Continued presence of drainage catheter seen between the stomach and left kidney, as well as continued and stable appearance of fluid collection medial to proximal stomach.   Dg Abd Acute W/chest  05/08/2014  Nonobstructive bowel gas pattern. Left pleural effusion  Similar position of left upper quadrant surgical drains.    US Thoracentesis Asp Pleural Space W/img Guide 05/10/2014  Successful ultrasound-guided left sided thoracentesis yielding 500 ml of pleural fluid.    Medical  Consultants:  PCCM IR Surgery   Other Consultants:  PT/OT  IAnti-Infectives:   Zosyn 4/10 --> Cipro 4/10 -->  Faye Ramsay, MD  [facility] Pager [phone]  If 7PM-7AM, please contact night-coverage [email or web address] Password [facility] 05/15/2014, 8:45 PM   LOS: 6 days   HPI/Subjective: Pt still with abd pain and left sided chest pain, but says she is better this AM  Objective: Filed Vitals:   05/14/14 1601 05/14/14 2139 05/15/14 0527 05/15/14 1545  BP: 124/74 125/74 118/64 108/54  Pulse: 131 125 106 106  Temp: 98.6 F (37 C) 99.2 F (37.3 C) 98.4 F (36.9 C) 97.8 F (36.6 C)  TempSrc: Oral Oral Oral Oral  Resp: 20 20 20 18   Height:      Weight:   86.773 kg (191 lb 4.8 oz)   SpO2: 96% 95% 98% 96%    Intake/Output Summary (Last 24 hours) at 05/15/14  2045 Last data filed at 05/15/14 1946  Gross per 24 hour  Intake    826 ml  Output   2593 ml  Net  -1767 ml    Exam:   General:  Pt is tired but looks better this AM  Cardiovascular: Regular rhythm, tachycardic, S1/S2, no rubs, no gallops  Respiratory: Diminished air movement on the left side, no crackles   Abdomen: Soft, non tender in epigastric area, non distended, bowel sounds present  Extremities: pulses DP and PT palpable bilaterally  Data Reviewed: Basic Metabolic Panel:  Recent Labs Lab 05/10/14 0350 05/11/14 0345 05/12/14 0335 05/13/14 0523 05/14/14 0512 05/15/14 0846  NA 139 139 137 138 137  --   K 3.3* 3.6 3.0* 3.2* 3.2*  --   CL 103 107 106 107 103  --   CO2 24 22 23 23 24   --   GLUCOSE 111* 115* 99 97 104*  --   BUN 15 18 13 11 8   --   CREATININE 1.37* 1.42* 1.10 1.07 0.99  --   CALCIUM 8.7 8.5 8.0* 8.3* 8.4  --   MG  --   --   --   --   --  1.3*   Liver Function Tests:  Recent Labs Lab 05/09/14 0655 05/10/14 0350  AST 38*  --   ALT 17  --   ALKPHOS 78  --   BILITOT 0.5  --   PROT 7.6 6.7  ALBUMIN 3.8  --     Recent Labs Lab 05/09/14 0655  LIPASE 85*   CBC:  Recent  Labs Lab 05/09/14 0655 05/09/14 0721 05/10/14 0350 05/11/14 0345 05/12/14 0335 05/14/14 0512  WBC 12.5*  --  15.4* 11.2* 9.2 7.3  HGB 10.3* 12.6 9.1* 9.1* 8.3* 8.8*  HCT 34.2* 37.0 29.2* 29.8* 26.7* 28.7*  MCV 97.2  --  96.7 96.4 95.0 94.7  PLT 476*  --  369 355 351 431*    Recent Results (from the past 240 hour(s))  MRSA PCR Screening     Status: None   Collection Time: 05/09/14 12:27 PM  Result Value Ref Range Status   MRSA by PCR NEGATIVE NEGATIVE Final  Body fluid culture     Status: None (Preliminary result)   Collection Time: 05/10/14  2:01 PM  Result Value Ref Range Status   Specimen Description PLEURAL  Final   Special Requests NONE  Final   Gram Stain   Final    FEW WBC PRESENT,BOTH PMN AND MONONUCLEAR NO ORGANISMS SEEN    Culture   Final    NO GROWTH 1 DAY Performed at [facility]    Report Status PENDING  Incomplete     Scheduled Meds: . ciprofloxacin  400 mg Intravenous Q12H  . diltiazem  30 mg Oral 4 times per day  . enoxaparin  injection  135 mg Subcutaneous Q24H  . latanoprost  1 drop Both Eyes QHS  . pantoprazole  40 mg Oral BID AC  . ZOSYN  IV  3.375 g Intravenous Q8H  . primidone  50 mg Oral QHS  . sucralfate  1 g Oral TID WC & HS  . tamsulosin  0.4 mg Oral Daily    Continuous Infusions:

## 2014-05-15 NOTE — Progress Notes (Signed)
[place]  [address] [place], [place] [address] Phone: [phone] FAX: [phone]    ED RAYSON [id number] 1945-06-14  CARE TEAM:  PCP: Mauricio Po, [place]  Outpatient Care Team: Patient Care Team: Golden Circle, FNP as PCP - General (Family Medicine) Carol Ada, MD as Consulting Physician (Gastroenterology) Truitt Merle, MD as Consulting Physician (Hematology) [name], MD as Consulting Physician (General Surgery)  Inpatient Treatment Team: Treatment Team: Attending Provider: Theodis Blaze, MD; Rounding Team: Redmond Baseman, MD; Registered Nurse: Alfonse Spruce, RN; Consulting Physician: [name], MD; Registered Nurse: Cheryle Horsfall, RN; Technician: Emmaline Kluver, NT; Technician: Lucila Maine, NT; Registered Nurse: Zadie Rhine, RN; Registered Nurse: Billy Coast Capes, RN  Problem List:   Principal Problem:   Pleural effusion Active Problems:   Hx pulmonary embolism   HTN (hypertension)   DVT (deep venous thrombosis)   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  Here for pleural effusion and chest pain.  Plan: Continue antibiotics and drainage. - cultures from pleural effusion negative.  3-6 weeks Abx total most likely.  May need ID consult?  See if can better drain collection in left pleura as that is the main problem.  VATS as final option if needed but try to hold off  Continue diet as tolerated. Nutrition key - check nutrition labs.  Suspect much related to mod/severe malnutrition.  SHE NEEDS TO EAT  -cont foley per urology w outpt f/u with Dr Junious Silk  Would plan sinogram via drains next week whether inpatient or  outpatient.  -VTE prophylaxis- SCDs, etc  -mobilize as tolerated to help recovery.  SHE NEEDS TO GET UP  -inc HR - ?HTN/Pain/anxiety - per IM.  Adin Hector, M.D., F.A.C.S. Gastrointestinal and Minimally Invasive Surgery [facility] [address] [place], [place] [address] [phone] Main / Paging   05/15/2014  Subjective:  Sore but narcotics help Many questions RN/CNA/students in room No n/v Appetite ok  Objective:  Vital signs:  Filed Vitals:   05/14/14 0449 05/14/14 1601 05/14/14 2139 05/15/14 0527  BP: 101/52 124/74 125/74 118/64  Pulse: 111 131 125 106  Temp: 98.7 F (37.1 C) 98.6 F (37 C) 99.2 F (37.3 C) 98.4 F (36.9 C)  TempSrc: Oral Oral Oral Oral  Resp: _0 Height:      Weight: 87.8 kg (193 lb 9 oz)   86.773 kg (191 lb 4.8 oz)  SpO2: 96% 96% 95% 98%    Last BM Date: 05/14/14  Intake/Output   Yesterday:  04/15 0701 - 04/16 0700 In: 960 [P.O.:600; IV Piggyback:300] Out: 5320 [Urine:1300; Drains:50; Stool:2] This shift:     Bowel function:  Flatus: y  BM: y  Drain: scant w green tinge  Physical Exam:  General: Pt awake/alert/oriented x4 in no acute distress Eyes: PERRL, normal EOM.  Sclera clear.  No icterus Neuro: CN II-XII intact w/o focal sensory/motor deficits. Lymph: No head/neck/groin lymphadenopathy Psych:  No delerium/psychosis/paranoia.  Chatty/talkative HENT: Normocephalic, Mucus membranes moist.  No thrush Neck: Supple, No tracheal deviation Chest: No chest wall pain w good excursion CV:  Pulses intact.  Regular rhythm MS: Normal AROM mjr joints.  No obvious  deformity Abdomen: Soft.  Nondistended.  Mildly tender at LUQ/flank only.  No evidence of peritonitis.  No incarcerated hernias. Ext:  SCDs BLE.  No mjr edema.  No cyanosis Skin: No petechiae / purpura  Results:   Labs: Results for orders placed or performed during the hospital encounter of 05/09/14 (from the past  48 hour(s))  CBC     Status: Abnormal   Collection Time: 05/14/14  5:12 AM  Result Value Ref Range   WBC 7.3 4.0 - 10.5 K/uL   RBC 3.03 (L) 3.87 - 5.11 MIL/uL   Hemoglobin 8.8 (L) 12.0 - 15.0 g/dL   HCT 28.7 (L) 36.0 - 46.0 %   MCV 94.7 78.0 - 100.0 fL   MCH 29.0 26.0 - 34.0 pg   MCHC 30.7 30.0 - 36.0 g/dL   RDW 16.5 (H) 11.5 - 15.5 %   Platelets 431 (H) 150 - 400 K/uL  Basic metabolic panel     Status: Abnormal   Collection Time: 05/14/14  5:12 AM  Result Value Ref Range   Sodium 137 135 - 145 mmol/L   Potassium 3.2 (L) 3.5 - 5.1 mmol/L   Chloride 103 96 - 112 mmol/L   CO2 24 19 - 32 mmol/L   Glucose, Bld 104 (H) 70 - 99 mg/dL   BUN 8 6 - 23 mg/dL   Creatinine, Ser 0.99 0.50 - 1.10 mg/dL   Calcium 8.4 8.4 - 10.5 mg/dL   GFR calc non Af Amer 58 (L) >90 mL/min   GFR calc Af Amer 67 (L) >90 mL/min    Comment: (NOTE) The eGFR has been calculated using the CKD EPI equation. This calculation has not been validated in all clinical situations. eGFR's persistently <90 mL/min signify possible Chronic Kidney Disease.    Anion gap 10 5 - 15    Imaging / Studies: Dg Chest 1 View  05/14/2014   CLINICAL DATA:  Post attempted left-sided thoracentesis  EXAM: CHEST  1 VIEW  COMPARISON:  Earlier same day ; 05/13/2014; 05/10/2014; CT abdomen pelvis - 05/11/2014  FINDINGS: Grossly unchanged borderline enlarged cardiac silhouette and mediastinal contours with persistent obscuration of left heart border secondary to grossly unchanged small loculated left-sided effusion with associated left basilar consolidative opacities. No new focal airspace opacities. No pneumothorax. No evidence of edema. No acute osseus abnormalities. Percutaneous drainage catheter overlies the left upper abdominal quadrant.  IMPRESSION: 1. Unchanged small partially loculated left-sided effusion post attempted thoracentesis. No pneumothorax. 2. Grossly unchanged consolidative opacities within the left lower lung.   Electronically  Signed   By: Sandi Mariscal M.D.   On: 05/14/2014 15:45   Dg Chest 2 View  05/14/2014   CLINICAL DATA:  Followup pleural effusion.  EXAM: CHEST  2 VIEW  COMPARISON:  Chest x-ray 05/13/2014 and CT abdomen 05/11/2014.  FINDINGS: Persistent small left pleural effusion with overlying atelectasis. The right lung remains clear. Stable left upper quadrant drainage catheter.  IMPRESSION: Persistent left pleural effusion with overlying atelectasis.   Electronically Signed   By: Marijo Sanes M.D.   On: 05/14/2014 14:10   Dg Chest 2 View  05/13/2014   CLINICAL DATA:  Pancreatic cancer, shortness of breath, pleural effusions, weakness  EXAM: CHEST  2 VIEW  COMPARISON:  05/10/2014  FINDINGS: Pigtail drainage catheter LEFT upper quadrant.  Enlargement of cardiac silhouette.  Atherosclerotic calcification aorta.  Mediastinal contours and pulmonary vascularity otherwise normal.  Increased LEFT pleural effusion and basilar atelectasis since previous exam.  Remaining lungs clear.  No pneumothorax.  Bones demineralized.  IMPRESSION: Increased LEFT pleural effusion and basilar atelectasis.  Enlargement of cardiac silhouette.   Electronically Signed   By: Lavonia Dana M.D.   On: 05/13/2014 14:11   US Thoracentesis Asp Pleural Space W/img Guide  05/14/2014   INDICATION: Symptomatic left sided pleural effusion  EXAM: US THORACENTESIS ASP PLEURAL SPACE W/IMG GUIDE  COMPARISON:  Chest radiograph - earlier same day; 05/13/2014; 05/10/2014; CT abdomen pelvis - 05/11/2014  MEDICATIONS: None  COMPLICATIONS: None immediate  TECHNIQUE: Informed written consent was obtained from the patient after a discussion of the risks, benefits and alternatives to treatment. A timeout was performed prior to the initiation of the procedure.  Initial ultrasound scanning demonstrates a small pleural effusion with multiple internal echogenic septations and apparent tethering of the left lower lobe (representative images 1 through 4). The lower chest was  prepped and draped in the usual sterile fashion. 1% lidocaine was used for local anesthesia.  A 5 [place] Yueh sheath needle was advanced into an anechoic component left-sided pleural effusion however only yielded approximately 2 cc of fluid. Yueh sheath needle was then advanced into a separate anechoic component of the left-sided pleural effusion however only approximately 8 cc of fluid was able to be aspirated. Multiple ultrasound images were saved for documentation purposes.  The catheter was removed and a dressing was applied. The patient tolerated the procedure well without immediate post procedural complication. The patient was escorted to have an upright chest radiograph.  FINDINGS: A total of approximately 10 cc of blood tinged serous fluid was removed.  IMPRESSION: Successful ultrasound-guided left sided thoracentesis yielding only 10 cc liters of pleural fluid secondary to marked loculation of the effusion.   Electronically Signed   By: Sandi Mariscal M.D.   On: 05/14/2014 16:06    Medications / Allergies: per chart  Antibiotics: Anti-infectives    Start     Dose/Rate Route Frequency Ordered Stop   05/10/14 2000  ciprofloxacin (CIPRO) IVPB 400 mg     400 mg 200 mL/hr over 60 Minutes Intravenous Every 12 hours 05/10/14 1758     05/10/14 1800  piperacillin-tazobactam (ZOSYN) IVPB 3.375 g     3.375 g 12.5 mL/hr over 240 Minutes Intravenous Every 8 hours 05/10/14 1743     05/10/14 1100  doxycycline (VIBRA-TABS) tablet 100 mg  Status:  Discontinued     100 mg Oral 2 times daily 05/10/14 1008 05/11/14 2251       Note: Portions of this report may have been transcribed using voice recognition software. Every effort was made to ensure accuracy; however, inadvertent computerized transcription errors may be present.   Any transcriptional errors that result from this process are unintentional.     Adin Hector, M.D., F.A.C.S. Gastrointestinal and Minimally Invasive Surgery [facility] [address], [place], [place] [address] [phone] Main / Paging   05/15/2014

## 2014-05-16 LAB — AMYLASE, PERITONEAL FLUID
AMYLASE, PERITONEAL FLUID: 9201 U/L
Amylase, peritoneal fluid: 4287 U/L
Amylase, peritoneal fluid: 24 U/L

## 2014-05-16 LAB — COMPREHENSIVE METABOLIC PANEL
ALT: 16 U/L (ref 0–35)
AST: 36 U/L (ref 0–37)
Albumin: 2.8 g/dL — ABNORMAL LOW (ref 3.5–5.2)
Alkaline Phosphatase: 88 U/L (ref 39–117)
Anion gap: 8 (ref 5–15)
BUN: 8 mg/dL (ref 6–23)
CALCIUM: 8.4 mg/dL (ref 8.4–10.5)
CO2: 26 mmol/L (ref 19–32)
Chloride: 101 mmol/L (ref 96–112)
Creatinine, Ser: 1.05 mg/dL (ref 0.50–1.10)
GFR calc Af Amer: 62 mL/min — ABNORMAL LOW (ref >90)
GFR, EST NON AFRICAN AMERICAN: 54 mL/min — AB (ref >90)
Glucose, Bld: 107 mg/dL — ABNORMAL HIGH (ref 70–99)
POTASSIUM: 3.2 mmol/L — AB (ref 3.5–5.1)
Sodium: 135 mmol/L (ref 135–145)
Total Bilirubin: 0.3 mg/dL (ref 0.3–1.2)
Total Protein: 6.8 g/dL (ref 6.0–8.3)

## 2014-05-16 LAB — CBC
HCT: 28.3 % — ABNORMAL LOW (ref 36.0–46.0)
Hemoglobin: 8.7 g/dL — ABNORMAL LOW (ref 12.0–15.0)
MCH: 28.8 pg (ref 26.0–34.0)
MCHC: 30.7 g/dL (ref 30.0–36.0)
MCV: 93.7 fL (ref 78.0–100.0)
Platelets: 410 10*3/uL — ABNORMAL HIGH (ref 150–400)
RBC: 3.02 MIL/uL — ABNORMAL LOW (ref 3.87–5.11)
RDW: 16.2 % — AB (ref 11.5–15.5)
WBC: 9.3 10*3/uL (ref 4.0–10.5)

## 2014-05-16 LAB — MAGNESIUM: Magnesium: 1.9 mg/dL (ref 1.5–2.5)

## 2014-05-16 LAB — AMYLASE: AMYLASE: 48 U/L (ref 0–105)

## 2014-05-16 MED ORDER — POTASSIUM CHLORIDE CRYS ER 20 MEQ PO TBCR
40.0000 meq | EXTENDED_RELEASE_TABLET | Freq: Once | ORAL | Status: AC
  Administered 2014-05-17: 40 meq via ORAL
  Filled 2014-05-16 (×2): qty 2

## 2014-05-16 MED ORDER — METRONIDAZOLE 500 MG PO TABS
500.0000 mg | ORAL_TABLET | Freq: Three times a day (TID) | ORAL | Status: DC
  Administered 2014-05-16 – 2014-05-21 (×15): 500 mg via ORAL
  Filled 2014-05-16 (×15): qty 1

## 2014-05-16 MED ORDER — CIPROFLOXACIN HCL 500 MG PO TABS
500.0000 mg | ORAL_TABLET | Freq: Two times a day (BID) | ORAL | Status: DC
  Administered 2014-05-16 – 2014-05-21 (×10): 500 mg via ORAL
  Filled 2014-05-16 (×10): qty 1

## 2014-05-16 NOTE — Progress Notes (Signed)
In and out cath returned 250 cc clear yellow urine.  Only output since 0700.  Dr. Doyle Askew notified, will continue to monitor and encourage pt to drink liquids.  Pt also complaining of nausea.

## 2014-05-16 NOTE — Progress Notes (Signed)
ANTIBIOTIC CONSULT NOTE - Follow up  Pharmacy Consult for Zosyn, Cipro Indication: empyema  Allergies  Allergen Reactions  . Sulfa Antibiotics Swelling    Patient Measurements: Height: 5' 9.5" (176.5 cm) Weight: 189 lb 3.2 oz (85.821 kg) IBW/kg (Calculated) : 67.35  Vital Signs: Temp: 97.7 F (36.5 C) (04/17 0705) Temp Source: Oral (04/17 0705) BP: 120/57 mmHg (04/17 0705) Pulse Rate: 111 (04/17 0705) Intake/Output from previous day: 04/16 0701 - 04/17 0700 In: 510 [P.O.:480; IV Piggyback:300] Out: 2641 [Urine:2600; Drains:41]  Labs:  Recent Labs  05/14/14 0512 05/16/14 0422  WBC 7.3 9.3  HGB 8.8* 8.7*  PLT 431* 410*  CREATININE 0.99 1.05   Estimated Creatinine Clearance: 61.4 mL/min (by C-G formula based on Cr of 1.05).  Microbiology: Recent Results (from the past 720 hour(s))  Culture, routine-abscess     Status: None   Collection Time: 04/16/14  3:41 PM  Result Value Ref Range Status   Specimen Description DRAINAGE  Final   Special Requests Normal  Final   Gram Stain   Final    NO WBC SEEN NO SQUAMOUS EPITHELIAL CELLS SEEN NO ORGANISMS SEEN Performed at [facility]    Culture   Final    FEW PSEUDOMONAS AERUGINOSA Performed at [facility]    Report Status 04/20/2014 FINAL  Final   Organism ID, Bacteria PSEUDOMONAS AERUGINOSA  Final      Susceptibility   Pseudomonas aeruginosa - MIC*    CEFEPIME <=1 SENSITIVE Sensitive     CEFTAZIDIME 2 SENSITIVE Sensitive     CIPROFLOXACIN <=0.25 SENSITIVE Sensitive     GENTAMICIN <=1 SENSITIVE Sensitive     IMIPENEM 1 SENSITIVE Sensitive     PIP/TAZO 8 SENSITIVE Sensitive     TOBRAMYCIN <=1 SENSITIVE Sensitive     * FEW PSEUDOMONAS AERUGINOSA  Clostridium Difficile by PCR     Status: None   Collection Time: 04/25/14  1:53 AM  Result Value Ref Range Status   C difficile by pcr NEGATIVE NEGATIVE Final  MRSA PCR Screening     Status: None   Collection Time: 05/09/14 12:27 PM  Result Value Ref  Range Status   MRSA by PCR NEGATIVE NEGATIVE Final    Comment:        The GeneXpert MRSA Assay (FDA approved for NASAL specimens only), is one component of a comprehensive MRSA colonization surveillance program. It is not intended to diagnose MRSA infection nor to guide or monitor treatment for MRSA infections.   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  Pleural fluid reveals about 9000 WBCs 90% neutrophils- possibly an extension of abdominal abscess as she has no pneumonia - abscess culture grew pseudomonas previously.  Pharmacy consulted to dose Zosyn and Cipro for empyema.  4/11 >> Doxycycline >> (4/15) 4/11 >> Zosyn >>  4/11 >> Cipro >>  Tmax: afebrile WBC:  WNL Renal: SCr 1.05, CrCl~62 ml/min  4/17: Day #7 Zosyn and Cipro.  Continued double coverage for possible psuedomonas.  Last pseuodmonas isolate (abscess drainage 04/16/14) was pan-sensitive, but patient has now had exposure to both Zosyn and Cipro and is at increased risk for resistance.  PCCM recommendations were to narrow to one antibiotic (Zosyn if inpatient, Cipro for discharge) if pleural fluid culture finalized as no growth.  Culture has finalized as no growth.  LUQ drain remains in place. 4/15 CXR showed persistent left effusion.  Surgery recommending ID consult and 3-6 week treatment with antibiotics.  Goal of  Therapy:  Appropriate abx dosing, eradication of infection.   Plan:  1. No adjustments to antibiotic doses.    -Continue Zosyn 3.375g IV Q8H infused over 4hrs.  -Continue Cipro 400mg  IV q12h 2. F/u ID recommendations for narrowing antibiotics and duration of therapy.  Hershal Coria, PharmD, BCPS Pager: [phone] 05/16/2014 11:57 AM

## 2014-05-16 NOTE — Progress Notes (Signed)
[facility]  [address] [place], [place] [address] Phone: [phone] FAX: [phone]    [name] [id number] [birth year]  CARE TEAM:  PCP: Mauricio Po, [place]  Outpatient Care Team: Patient Care Team: Golden Circle, FNP as PCP - General (Family Medicine) Carol Ada, MD as Consulting Physician (Gastroenterology) Truitt Merle, MD as Consulting Physician (Hematology) [name], MD as Consulting Physician (General Surgery)  Inpatient Treatment Team: Treatment Team: Attending Provider: Theodis Blaze, MD; Rounding Team: Redmond Baseman, MD; Registered Nurse: Alfonse Spruce, RN; Consulting Physician: [name], MD; Technician: Lucila Maine, NT; Registered Nurse: Zadie Rhine, RN; Technician: Desma Mcgregor; Registered Nurse: Roe Rutherford, RN  Problem List:   Principal Problem:   Pleural effusion Active Problems:   Hx pulmonary embolism   HTN (hypertension)   DVT (deep venous thrombosis)   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  Loculated left pleural effusion and chest pain s/p drainage & Abx  Plan: Continue antibiotics and drainage. - cultures from pleural effusion negative.  3-6 weeks Abx total most likely.  May need ID consult?  See if can better drain collection in left pleura as that is the main problem.  VATS as final option if needed - may need to consider if not better since only 88m on 4/15 drainage.  MAybe drain in pleural cavity w tPA?  Try to hold off on VATS if she improves.  Continue diet as tolerated. Nutrition key - check nutrition labs.  Suspect much related to  mod/severe malnutrition.  SHE NEEDS TO EAT  -cont foley per Urology w outpt f/u with Dr EJunious Silk -check drains for amylase r/o persistent panc staple line leak.  Compare to serum amylase  Would plan sinogram via drains next week whether inpatient or outpatient.  Boiwel regimen w probiotics.  -VTE prophylaxis- SCDs, etc  -mobilize as tolerated to help recovery.  SHE NEEDS TO GET UP  -inc HR - ?HTN/Pain/anxiety - per IM.  I updated the patient's status to the patient & RNs.  Recommendations were made.  Questions were answered.  They expressed understanding & appreciation.   SAdin Hector M.D., F.A.C.S. Gastrointestinal and Minimally Invasive Surgery [facility] [address] [place] [phone] / Paging   05/16/2014  Subjective:  Sore but narcotics help Many questions RN/CNA/students in room No n/v Appetite ok  Objective:  Vital signs:  Filed Vitals:   05/15/14 0527 05/15/14 1545 05/15/14 2157 05/16/14 0705  BP: 118/64 108/54 124/66 120/57  Pulse: 106 106 117 111  Temp: 98.4 F (36.9 C) 97.8 F (36.6 C) 98 F (36.7 C) 97.7 F (36.5 C)  TempSrc: Oral Oral Oral Oral  Resp: _0 Height:      Weight: 86.773 kg (191 lb 4.8 oz)   85.821 kg (189 lb 3.2 oz)  SpO2: 98% 96% 97% 94%    Last BM Date: 05/14/14  Intake/Output   Yesterday:  04/16 0701 - 04/17 0700 In: 8734[P.O.:480; IV Piggyback:300] Out: 2641 [Urine:2600; Drains:41] This shift:     Bowel function:  Flatus: y  BM: y  Drain: scant w green tinge  Physical Exam:  General: Pt awake/alert/oriented x4 in no acute distress Eyes: PERRL, normal EOM.  Sclera clear.  No icterus Neuro: CN II-XII intact w/o focal sensory/motor deficits. Lymph: No head/neck/groin lymphadenopathy Psych:  No delerium/psychosis/paranoia.  Chatty/talkative HENT: Normocephalic, Mucus membranes moist.  No thrush Neck: Supple, No tracheal  deviation Chest: No chest wall pain w good excursion CV:  Pulses intact.  Regular rhythm MS: Normal AROM mjr joints.  No obvious deformity Abdomen: Soft.  Nondistended.  Mildly tender at LUQ/flank only.  No evidence of peritonitis.  No incarcerated hernias. Ext:  SCDs BLE.  No mjr edema.  No cyanosis Skin: No petechiae / purpura  Results:   Labs: Results for orders placed or performed during the hospital encounter of 05/09/14 (from the past 48 hour(s))  Magnesium     Status: Abnormal   Collection Time: 05/15/14  8:46 AM  Result Value Ref Range   Magnesium 1.3 (L) 1.5 - 2.5 mg/dL  CBC     Status: Abnormal   Collection Time: 05/16/14  4:22 AM  Result Value Ref Range   WBC 9.3 4.0 - 10.5 K/uL   RBC 3.02 (L) 3.87 - 5.11 MIL/uL   Hemoglobin 8.7 (L) 12.0 - 15.0 g/dL   HCT 28.3 (L) 36.0 - 46.0 %   MCV 93.7 78.0 - 100.0 fL   MCH 28.8 26.0 - 34.0 pg   MCHC 30.7 30.0 - 36.0 g/dL   RDW 16.2 (H) 11.5 - 15.5 %   Platelets 410 (H) 150 - 400 K/uL  Comprehensive metabolic panel     Status: Abnormal   Collection Time: 05/16/14  4:22 AM  Result Value Ref Range   Sodium 135 135 - 145 mmol/L   Potassium 3.2 (L) 3.5 - 5.1 mmol/L   Chloride 101 96 - 112 mmol/L   CO2 26 19 - 32 mmol/L   Glucose, Bld 107 (H) 70 - 99 mg/dL   BUN 8 6 - 23 mg/dL   Creatinine, Ser 1.05 0.50 - 1.10 mg/dL   Calcium 8.4 8.4 - 10.5 mg/dL   Total Protein 6.8 6.0 - 8.3 g/dL   Albumin 2.8 (L) 3.5 - 5.2 g/dL   AST 36 0 - 37 U/L   ALT 16 0 - 35 U/L   Alkaline Phosphatase 88 39 - 117 U/L   Total Bilirubin 0.3 0.3 - 1.2 mg/dL   GFR calc non Af Amer 54 (L) >90 mL/min   GFR calc Af Amer 62 (L) >90 mL/min    Comment: (NOTE) The eGFR has been calculated using the CKD EPI equation. This calculation has not been validated in all clinical situations. eGFR's persistently <90 mL/min signify possible Chronic Kidney Disease.    Anion gap 8 5 - 15  Magnesium     Status: None   Collection Time: 05/16/14  4:22 AM  Result Value  Ref Range   Magnesium 1.9 1.5 - 2.5 mg/dL    Imaging / Studies: Dg Chest 1 View  05/14/2014   CLINICAL DATA:  Post attempted left-sided thoracentesis  EXAM: CHEST  1 VIEW  COMPARISON:  Earlier same day ; 05/13/2014; 05/10/2014; CT abdomen pelvis - 05/11/2014  FINDINGS: Grossly unchanged borderline enlarged cardiac silhouette and mediastinal contours with persistent obscuration of left heart border secondary to grossly unchanged small loculated left-sided effusion with associated left basilar consolidative opacities. No new focal airspace opacities. No pneumothorax. No evidence of edema. No acute osseus abnormalities. Percutaneous drainage catheter overlies the left upper  abdominal quadrant.  IMPRESSION: 1. Unchanged small partially loculated left-sided effusion post attempted thoracentesis. No pneumothorax. 2. Grossly unchanged consolidative opacities within the left lower lung.   Electronically Signed   By: Sandi Mariscal M.D.   On: 05/14/2014 15:45   Dg Chest 2 View  05/14/2014   CLINICAL DATA:  Followup pleural effusion.  EXAM: CHEST  2 VIEW  COMPARISON:  Chest x-ray 05/13/2014 and CT abdomen 05/11/2014.  FINDINGS: Persistent small left pleural effusion with overlying atelectasis. The right lung remains clear. Stable left upper quadrant drainage catheter.  IMPRESSION: Persistent left pleural effusion with overlying atelectasis.   Electronically Signed   By: Marijo Sanes M.D.   On: 05/14/2014 14:10   US Thoracentesis Asp Pleural Space W/img Guide  05/14/2014   INDICATION: Symptomatic left sided pleural effusion  EXAM: US THORACENTESIS ASP PLEURAL SPACE W/IMG GUIDE  COMPARISON:  Chest radiograph - earlier same day; 05/13/2014; 05/10/2014; CT abdomen pelvis - 05/11/2014  MEDICATIONS: None  COMPLICATIONS: None immediate  TECHNIQUE: Informed written consent was obtained from the patient after a discussion of the risks, benefits and alternatives to treatment. A timeout was performed prior to the initiation of  the procedure.  Initial ultrasound scanning demonstrates a small pleural effusion with multiple internal echogenic septations and apparent tethering of the left lower lobe (representative images 1 through 4). The lower chest was prepped and draped in the usual sterile fashion. 1% lidocaine was used for local anesthesia.  A 5 [place] Yueh sheath needle was advanced into an anechoic component left-sided pleural effusion however only yielded approximately 2 cc of fluid. Yueh sheath needle was then advanced into a separate anechoic component of the left-sided pleural effusion however only approximately 8 cc of fluid was able to be aspirated. Multiple ultrasound images were saved for documentation purposes.  The catheter was removed and a dressing was applied. The patient tolerated the procedure well without immediate post procedural complication. The patient was escorted to have an upright chest radiograph.  FINDINGS: A total of approximately 10 cc of blood tinged serous fluid was removed.  IMPRESSION: Successful ultrasound-guided left sided thoracentesis yielding only 10 cc liters of pleural fluid secondary to marked loculation of the effusion.   Electronically Signed   By: Sandi Mariscal M.D.   On: 05/14/2014 16:06    Medications / Allergies: per chart  Antibiotics: Anti-infectives    Start     Dose/Rate Route Frequency Ordered Stop   05/10/14 2000  ciprofloxacin (CIPRO) IVPB 400 mg     400 mg 200 mL/hr over 60 Minutes Intravenous Every 12 hours 05/10/14 1758     05/10/14 1800  piperacillin-tazobactam (ZOSYN) IVPB 3.375 g     3.375 g 12.5 mL/hr over 240 Minutes Intravenous Every 8 hours 05/10/14 1743     05/10/14 1100  doxycycline (VIBRA-TABS) tablet 100 mg  Status:  Discontinued     100 mg Oral 2 times daily 05/10/14 1008 05/11/14 2251       Note: Portions of this report may have been transcribed using voice recognition software. Every effort was made to ensure accuracy; however, inadvertent  computerized transcription errors may be present.   Any transcriptional errors that result from this process are unintentional.     Adin Hector, M.D., F.A.C.S. Gastrointestinal and Minimally Invasive Surgery [facility] [address] [place], [place] [address] [phone] Main / Paging   05/16/2014

## 2014-05-16 NOTE — Progress Notes (Signed)
Patient ID: [name], female   DOB: [birth year], 69 y.o.   MRN: [id number]  [facility] PROGRESS NOTE  [name] EXH:[id number] DOB: 11/01/1945 DOA: 05/09/2014 PCP: Mauricio Po, FNP   Brief narrative:    69 y.o. 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 She has followed with Dr Burr Medico with plan is to start chemo soon. Pt presented with left sided chest pain and upper abd pain and was found to have moderate sized pleural effusion requiring IR guided left thoracentesis 4/11 with removal of 500 cc of fluid.   Major events since admission: 4/15 - CXR with worsening pleural effusion, IR re consulted for consideration of repeat thoracentesis, only 11 cc able to remove due to significant loculation of the fluid  4/16 - pt reports feeling better, pleural fluid analysis with no growth 4/17 - d/c with Dr. Tommy Medal, no need for double Pseudomonas coverage, rec's to d/c Zosyn, continue Cipro and add Flagyl   Assessment/Plan:    Principal Problem:   Left chest/ abdominal pain - partly due to drains and possibly due to effusion, ? parapneumonic effusion related to recent LUQ abscess  - now status post left thoracentesis 4/11 with 500 cc fluid removed, exudate  - appreciate IR and PCCM input - d/w ID on call, no need for double coverage for Pseudomonas, recommendation is to d/c Zosyn and continue Cipro with addition of Flagyl due to risk of C.Diff  - repeat CXR 4/16 notable for worsening pleural effusion, pt is clinically stable this AM will less dyspnea    Moderate to large acute left pleural effusion - possibly related to extension of the abd abscess - still fairly diminished breath sounds on the  left side - CXR 4/16 with worsening effusion but respiratory status remains stable - pleural fluid with no bacterial growth to date   LUQ abscess with recent cultures growing Pseudomonas - status post drainage on 3/18 - WBC continue trending down and remains WNL for 24 hours  - repeat CT abd 4/12 with decreasing size of the abscess  - will stop Zosyn and continue Cipro, added flagyl per ID Dr. Tommy Medal recommendations    Slight volume overload - weight is up since admission from 190 to 195 lbs on 4/12 --> 203 lbs --> 193 lbs --> 191 lbs --> 189 lbs this AM - stopped IVF 4/13 and gave low dose lasix 20 mg IV x 1 dose (4/13), gave additional dose of Lasix  4/14 and 4/15 - has been off Lasix since 4/16 and weight continues trending down  - monitor weight and strict I's and O's   Hypokalemia - still low with low Mg level - will supplement Mg and K, repeat BMP and Mg level in AM   Prior DVT s/p IVC filter - continue Lovenox  per pharmacy    Acute renal failure - pre renal  - Cr is now WNL   Anemia of acute illness imposed on anemia of chronic disease, malignancy - no signs of active bleeding, Hg remains stable over the past 24 hours  - no indication for transfusion at this time  - repeat CBC in AM   Sepsis secondary to the above, L pleural effusion, ? parapneumonic -  on 4/11 - WBC 15 K, T 100F, HR in 110 with BP 100/33 - continue ABX as noted above - WBC Is now WNL and sepsis etiology resolved    Pancreatic cancer of tail, s/p distal pancreatectomy, splenectomy, & partial colectomy 04/08/2014 - to follow up with Dr. Barry Dienes and Dr. Burr Medico, will schedule the appointments prior to discharge    Severe PCM - in the context of acute illness - nutritionist consulted, pt tolerating diet well but still poor oral intake    Urinary retention - despite multiple voiding trails after surgery, she continued to retain urine - foley out, will need outpatient follow up with Dr. Junious Silk    Tremors   -  on Primidone   Acute functional quadriplegia - in the setting of acute illness - pt sitting in chair today, encouraged ambulation with assistance if able to tolerate   DVT prophylaxis - on Lovenox full dose   Code Status: Full.  Family Communication:  plan of care discussed with the patient Disposition Plan: Not ready for d/c, poor oral intake and slowly getting better, possibly early next week   IV access:  Peripheral IV  Procedures and diagnostic studies:    Ct Abdomen Pelvis Wo Contrast  05/11/2014   Two left upper quadrant surgical drains remain in place and unchanged in position. No significant fluid collection within the left upper quadrant.  Slight interval decrease in size of a fluid collection over the surgical bed adjacent the distal pancreatectomy measuring 2.1 x 2.7 cm (previously 2.7 x 3 cm).  Interval improvement small amount of free pelvic fluid.  Stable stranding of the mesenteric fat adjacent the colonic anastomosis in the left mid abdomen likely postsurgical change.  Other stable chronic changes within the abdomen.  Continued moderate left pleural effusion with associated compressive atelectasis. Tiny right pleural fluid collection with associated atelectasis.      Dg Chest 1 View   05/10/2014   Left effusion smaller following thoracentesis. There is a small left effusion with left base atelectasis. Subpulmonic chest tube remains on the left as does a drain in the upper abdomen on the left. Right lung is clear. No change in cardiac silhouette. No demonstrable pneumothorax.     Dg Chest 2 View  05/09/2014   Progressed left pleural effusion, now moderate to large. Associated decreased ventilation in the left lung. 2. Lower lung volumes.     Ct Angio Chest Pe W/cm &/or Wo Cm  05/09/2014   No evidence of pulmonary embolus.  Moderate left pleural effusion is noted with associated atelectasis of the left lower lobe.  Continued presence of drainage catheter seen between the stomach and  left kidney, as well as continued and stable appearance of fluid collection medial to proximal stomach.   Dg Abd Acute W/chest  05/08/2014  Nonobstructive bowel gas pattern. Left pleural effusion  Similar position of left upper quadrant surgical drains.    US Thoracentesis Asp Pleural Space W/img Guide 05/10/2014  Successful ultrasound-guided left sided thoracentesis yielding 500 ml of pleural fluid.    Medical Consultants:  PCCM IR Surgery   Other Consultants:  PT/OT  IAnti-Infectives:   Zosyn 4/10 --> Cipro 4/10 -->  Faye Ramsay, MD  [facility] Pager [phone]  If 7PM-7AM, please contact night-coverage [email or web address] Password [facility] 05/16/2014, 4:07 PM   LOS: 7 days   HPI/Subjective: Pt still with abd pain and left sided chest pain overall better   Objective: Filed Vitals:   05/15/14 1545 05/15/14 2157 05/16/14 0705 05/16/14  1407  BP: 108/54 124/66 120/57 118/62  Pulse: 106 117 111 113  Temp: 97.8 F (36.6 C) 98 F (36.7 C) 97.7 F (36.5 C) 97.8 F (36.6 C)  TempSrc: Oral Oral Oral Oral  Resp: 18 20 20 20   Height:      Weight:   85.821 kg (189 lb 3.2 oz)   SpO2: 96% 97% 94% 96%    Intake/Output Summary (Last 24 hours) at 05/16/14 1607 Last data filed at 05/16/14 1300  Gross per 24 hour  Intake   1308 ml  Output   2628 ml  Net  -1320 ml    Exam:   General:  Pt is tired but looks better this AM  Cardiovascular: Regular rhythm, tachycardic, S1/S2, no rubs, no gallops  Respiratory: Diminished air movement on the left side, no crackles   Abdomen: Soft, non tender in epigastric area, non distended, bowel sounds present  Extremities: pulses DP and PT palpable bilaterally  Data Reviewed: Basic Metabolic Panel:  Recent Labs Lab 05/11/14 0345 05/12/14 0335 05/13/14 0523 05/14/14 0512 05/15/14 0846 05/16/14 0422  NA 139 137 138 137  --  135  K 3.6 3.0* 3.2* 3.2*  --  3.2*  CL 107 106 107 103  --  101  CO2 22 23 23 24   --  26  GLUCOSE 115* 99 97  104*  --  107*  BUN 18 13 11 8   --  8  CREATININE 1.42* 1.10 1.07 0.99  --  1.05  CALCIUM 8.5 8.0* 8.3* 8.4  --  8.4  MG  --   --   --   --  1.3* 1.9   Liver Function Tests:  Recent Labs Lab 05/10/14 0350 05/16/14 0422  AST  --  36  ALT  --  16  ALKPHOS  --  88  BILITOT  --  0.3  PROT 6.7 6.8  ALBUMIN  --  2.8*    Recent Labs Lab 05/16/14 0422  AMYLASE 48   CBC:  Recent Labs Lab 05/10/14 0350 05/11/14 0345 05/12/14 0335 05/14/14 0512 05/16/14 0422  WBC 15.4* 11.2* 9.2 7.3 9.3  HGB 9.1* 9.1* 8.3* 8.8* 8.7*  HCT 29.2* 29.8* 26.7* 28.7* 28.3*  MCV 96.7 96.4 95.0 94.7 93.7  PLT 369 355 351 431* 410*    Recent Results (from the past 240 hour(s))  MRSA PCR Screening     Status: None   Collection Time: 05/09/14 12:27 PM  Result Value Ref Range Status   MRSA by PCR NEGATIVE NEGATIVE Final  Body fluid culture     Status: None (Preliminary result)   Collection Time: 05/10/14  2:01 PM  Result Value Ref Range Status   Specimen Description PLEURAL  Final   Special Requests NONE  Final   Gram Stain   Final    FEW WBC PRESENT,BOTH PMN AND MONONUCLEAR NO ORGANISMS SEEN    Culture   Final    NO GROWTH 1 DAY Performed at [facility]    Report Status PENDING  Incomplete     Scheduled Meds: . ciprofloxacin  400 mg Intravenous Q12H  . diltiazem  30 mg Oral 4 times per day  . enoxaparin  injection  135 mg Subcutaneous Q24H  . latanoprost  1 drop Both Eyes QHS  . pantoprazole  40 mg Oral BID AC  . ZOSYN  IV  3.375 g Intravenous Q8H  . primidone  50 mg Oral QHS  . sucralfate  1 g Oral TID WC &  HS  . tamsulosin  0.4 mg Oral Daily    Continuous Infusions:

## 2014-05-17 ENCOUNTER — Inpatient Hospital Stay (HOSPITAL_COMMUNITY): Payer: Medicare Other

## 2014-05-17 LAB — BASIC METABOLIC PANEL
Anion gap: 10 (ref 5–15)
BUN: 6 mg/dL (ref 6–23)
CHLORIDE: 99 mmol/L (ref 96–112)
CO2: 24 mmol/L (ref 19–32)
Calcium: 8.3 mg/dL — ABNORMAL LOW (ref 8.4–10.5)
Creatinine, Ser: 0.79 mg/dL (ref 0.50–1.10)
GFR, EST NON AFRICAN AMERICAN: 84 mL/min — AB (ref >90)
Glucose, Bld: 94 mg/dL (ref 70–99)
Potassium: 3 mmol/L — ABNORMAL LOW (ref 3.5–5.1)
SODIUM: 133 mmol/L — AB (ref 135–145)

## 2014-05-17 LAB — PREALBUMIN: Prealbumin: 23 mg/dL (ref 18.0–45.0)

## 2014-05-17 LAB — CBC
HCT: 27.5 % — ABNORMAL LOW (ref 36.0–46.0)
Hemoglobin: 8.7 g/dL — ABNORMAL LOW (ref 12.0–15.0)
MCH: 29.5 pg (ref 26.0–34.0)
MCHC: 31.6 g/dL (ref 30.0–36.0)
MCV: 93.2 fL (ref 78.0–100.0)
Platelets: 420 10*3/uL — ABNORMAL HIGH (ref 150–400)
RBC: 2.95 MIL/uL — ABNORMAL LOW (ref 3.87–5.11)
RDW: 15.9 % — ABNORMAL HIGH (ref 11.5–15.5)
WBC: 7.9 10*3/uL (ref 4.0–10.5)

## 2014-05-17 LAB — MAGNESIUM: MAGNESIUM: 1.8 mg/dL (ref 1.5–2.5)

## 2014-05-17 MED ORDER — POTASSIUM CHLORIDE CRYS ER 20 MEQ PO TBCR
40.0000 meq | EXTENDED_RELEASE_TABLET | Freq: Two times a day (BID) | ORAL | Status: AC
  Administered 2014-05-17 (×2): 40 meq via ORAL
  Filled 2014-05-17 (×2): qty 2

## 2014-05-17 MED ORDER — IOHEXOL 300 MG/ML  SOLN
10.0000 mL | Freq: Once | INTRAMUSCULAR | Status: AC | PRN
  Administered 2014-05-17: 5 mL

## 2014-05-17 NOTE — Progress Notes (Signed)
Pt alerted RN that she was done on [facility]. Pt was only able to have a small BM, no urine. Will continue to monitor

## 2014-05-17 NOTE — Progress Notes (Signed)
Patient ID: [name], female   DOB: 04-23-1945, 69 y.o.   MRN: [id number]  [facility] PROGRESS NOTE  MASSA PE JGG:[id number] DOB: [birth year] DOA: 05/09/2014 PCP: Mauricio Po, FNP   Brief narrative:    69 y.o. 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 She has followed with Dr Burr Medico with plan is to start chemo soon. Pt presented with left sided chest pain and upper abd pain and was found to have moderate sized pleural effusion requiring IR guided left thoracentesis 4/11 with removal of 500 cc of fluid.   Major events since admission: 4/15 - CXR with worsening pleural effusion, IR re consulted for consideration of repeat thoracentesis, only 11 cc able to remove due to significant loculation of the fluid  4/16 - pt reports feeling better, pleural fluid analysis with no growth 4/17 - d/c with Dr. Tommy Medal, no need for double Pseudomonas coverage, rec's to d/c Zosyn, continue Cipro and add Flagyl  4/18 - pt reports not voiding on her own and aware may need longer term foley cath but hesitant to have it placed today   Assessment/Plan:    Principal Problem:   Left chest/ abdominal pain - partly due to drains and possibly due to effusion, ? parapneumonic effusion related to recent LUQ abscess  - now status post left thoracentesis 4/11 with 500 cc fluid removed, exudate with no bact growth to date  - appreciate IR and PCCM input - d/w ID on call, no need for double coverage for Pseudomonas - recommendation was to d/c Zosyn and continue Cipro with addition of Flagyl due to risk of C.Diff (changes made 4/17) - repeat CXR 4/16 notable for worsening pleural effusion, pt is clinically stable this  AM will less dyspnea  - repeat thoracentesis 4/16 with only 10 cc of fluid able to remove    Moderate to large acute left pleural effusion - possibly related to extension of the abd abscess - still fairly diminished breath sounds on the left side, s/p thoracentesis 4/11 and again 4/16 (only 10 cc removed on 4/16) - CXR 4/16 with worsening effusion but respiratory status remains stable - pleural fluid with no bacterial growth to date   LUQ abscess with recent cultures growing Pseudomonas - status post drainage on 3/18 - WBC continue trending down and remains WNL for 24 hours  - repeat CT abd 4/12 with decreasing size of the abscess  - on 4/17 stopped Zosyn and continued Cipro, added flagyl per ID Dr. Tommy Medal recommendations  - pt so far tolerating well    Slight volume overload - weight is up since admission from 190 to 195 lbs on 4/12 --> 203 lbs --> 193 lbs --> 191 lbs --> 189 lbs --> 187 lbs this AM - stopped IVF 4/13 and gave low dose lasix 20 mg IV x 1 dose (4/13), gave additional dose of Lasix  4/14 and 4/15 - has been off Lasix since 4/16 and weight continues trending down  - monitor weight and strict I's and O's   Hypokalemia - still low with low Mg level - continue to supplement electrolytes and repeat BMP and Mg level in AM   Prior DVT s/p IVC filter - continue Lovenox  per  pharmacy    Acute renal failure - pre renal  - Cr is now WNL   Anemia of acute illness imposed on anemia of chronic disease, malignancy - no signs of active bleeding, Hg remains stable over the past 24 hours  - no indication for transfusion at this time  - repeat CBC in AM   Sepsis secondary to the above, L pleural effusion, ? parapneumonic - on 4/11 - WBC 15 K, T 100F, HR in 110 with BP 100/33 - continue ABX as noted above - WBC Is now WNL and sepsis etiology resolved    Pancreatic cancer of tail, s/p distal pancreatectomy, splenectomy, & partial colectomy 04/08/2014 - to follow up with Dr. Barry Dienes and  Dr. Burr Medico, will schedule the appointments prior to discharge    Severe PCM - in the context of acute illness - nutritionist consulted, pt tolerating diet well but still poor oral intake    Urinary retention - despite multiple voiding trails after surgery, she continued to retain urine - foley out, will need outpatient follow up with Dr. Junious Silk  - if still with difficulty with voiding, plan to place foley    Tremors   - on Primidone   Acute functional quadriplegia - in the setting of acute illness - pt hsa been refusing PT but I have encouraged ambulation with assistance if able to tolerate   DVT prophylaxis - on Lovenox full dose   Code Status: Full.  Family Communication:  plan of care discussed with the patient Disposition Plan: Not ready for d/c, poor oral intake, urinary retention, may need foley to be placed prior to discharge   IV access:  Peripheral IV  Procedures and diagnostic studies:    Ct Abdomen Pelvis Wo Contrast  05/11/2014   Two left upper quadrant surgical drains remain in place and unchanged in position. No significant fluid collection within the left upper quadrant.  Slight interval decrease in size of a fluid collection over the surgical bed adjacent the distal pancreatectomy measuring 2.1 x 2.7 cm (previously 2.7 x 3 cm).  Interval improvement small amount of free pelvic fluid.  Stable stranding of the mesenteric fat adjacent the colonic anastomosis in the left mid abdomen likely postsurgical change.  Other stable chronic changes within the abdomen.  Continued moderate left pleural effusion with associated compressive atelectasis. Tiny right pleural fluid collection with associated atelectasis.      Dg Chest 1 View   05/10/2014   Left effusion smaller following thoracentesis. There is a small left effusion with left base atelectasis. Subpulmonic chest tube remains on the left as does a drain in the upper abdomen on the left. Right lung is clear. No change in cardiac  silhouette. No demonstrable pneumothorax.     Dg Chest 2 View  05/09/2014   Progressed left pleural effusion, now moderate to large. Associated decreased ventilation in the left lung. 2. Lower lung volumes.     Ct Angio Chest Pe W/cm &/or Wo Cm  05/09/2014   No evidence of pulmonary embolus.  Moderate left pleural effusion is noted with associated atelectasis of the left lower lobe.  Continued presence of drainage catheter seen between the stomach and left kidney, as well as continued and stable appearance of fluid collection medial to proximal stomach.   Dg Abd Acute W/chest  05/08/2014  Nonobstructive bowel gas pattern. Left pleural effusion  Similar position of left upper quadrant surgical drains.    US Thoracentesis Asp Pleural Space W/img Guide 05/10/2014  Successful  ultrasound-guided left sided thoracentesis yielding 500 ml of pleural fluid.    Medical Consultants:  PCCM IR Surgery   Other Consultants:  PT/OT  IAnti-Infectives:   Zosyn 4/10 --> 4/17  Cipro 4/10 --> Flagyl 4/17 -->  Faye Ramsay, MD  [facility] Pager [phone]  If 7PM-7AM, please contact night-coverage [email or web address] Password [facility] 05/17/2014, 2:37 PM   LOS: 8 days   HPI/Subjective: Pt concerned with urinary retention but hesitant to have foley placed.  Objective: Filed Vitals:   05/16/14 2238 05/17/14 0629 05/17/14 1239 05/17/14 1347  BP: 126/58 125/64 136/72 122/56  Pulse: 126 122 96 136  Temp: 98.9 F (37.2 C) 98.4 F (36.9 C) 97.9 F (36.6 C) 98.4 F (36.9 C)  TempSrc: Oral Oral Oral Oral  Resp: 20 20  20   Height:      Weight:  85.2 kg (187 lb 13.3 oz)    SpO2: 97% 94% 97% 97%    Intake/Output Summary (Last 24 hours) at 05/17/14 1437 Last data filed at 05/17/14 1436  Gross per 24 hour  Intake    243 ml  Output    351 ml  Net   -108 ml    Exam:   General:  Pt is tired but looks better this AM  Cardiovascular: Regular rhythm, tachycardic, S1/S2, no rubs, no gallops  Respiratory:  Diminished air movement on the left side, no crackles   Abdomen: Soft, non tender in epigastric area, non distended, bowel sounds present  Extremities: pulses DP and PT palpable bilaterally  Data Reviewed: Basic Metabolic Panel:  Recent Labs Lab 05/12/14 0335 05/13/14 0523 05/14/14 0512 05/15/14 0846 05/16/14 0422 05/17/14 0417  NA 137 138 137  --  135 133*  K 3.0* 3.2* 3.2*  --  3.2* 3.0*  CL 106 107 103  --  101 99  CO2 23 23 24   --  26 24  GLUCOSE 99 97 104*  --  107* 94  BUN 13 11 8   --  8 6  CREATININE 1.10 1.07 0.99  --  1.05 0.79  CALCIUM 8.0* 8.3* 8.4  --  8.4 8.3*  MG  --   --   --  1.3* 1.9 1.8   Liver Function Tests:  Recent Labs Lab 05/16/14 0422  AST 36  ALT 16  ALKPHOS 88  BILITOT 0.3  PROT 6.8  ALBUMIN 2.8*    Recent Labs Lab 05/16/14 0422  AMYLASE 48   CBC:  Recent Labs Lab 05/11/14 0345 05/12/14 0335 05/14/14 0512 05/16/14 0422 05/17/14 0417  WBC 11.2* 9.2 7.3 9.3 7.9  HGB 9.1* 8.3* 8.8* 8.7* 8.7*  HCT 29.8* 26.7* 28.7* 28.3* 27.5*  MCV 96.4 95.0 94.7 93.7 93.2  PLT 355 351 431* 410* 420*    Recent Results (from the past 240 hour(s))  MRSA PCR Screening     Status: None   Collection Time: 05/09/14 12:27 PM  Result Value Ref Range Status   MRSA by PCR NEGATIVE NEGATIVE Final  Body fluid culture     Status: None (Preliminary result)   Collection Time: 05/10/14  2:01 PM  Result Value Ref Range Status   Specimen Description PLEURAL  Final   Special Requests NONE  Final   Gram Stain   Final    FEW WBC PRESENT,BOTH PMN AND MONONUCLEAR NO ORGANISMS SEEN    Culture   Final    NO GROWTH 1 DAY Performed at [facility]    Report Status PENDING  Incomplete     Scheduled  Meds: . ciprofloxacin  400 mg Intravenous Q12H  . diltiazem  30 mg Oral 4 times per day  . enoxaparin  injection  135 mg Subcutaneous Q24H  . latanoprost  1 drop Both Eyes QHS  . pantoprazole  40 mg Oral BID AC  . ZOSYN  IV  3.375 g Intravenous Q8H   . primidone  50 mg Oral QHS  . sucralfate  1 g Oral TID WC & HS  . tamsulosin  0.4 mg Oral Daily    Continuous Infusions:

## 2014-05-17 NOTE — Progress Notes (Signed)
Bladder scan = 585. Pt in no acute distress, will continue to monitor, encourage pt to void within an hour , if not voided will I&O cath.  Barbee Shropshire. Brigitte Pulse, RN

## 2014-05-17 NOTE — Progress Notes (Signed)
[facility]  [address] [place], [place] [address] Phone: [phone] FAX: [phone]    [name] [id number] 1945-05-24  CARE TEAM:  PCP: Mauricio Po, [place]  Outpatient Care Team: Patient Care Team: Golden Circle, FNP as PCP - General (Family Medicine) Carol Ada, MD as Consulting Physician (Gastroenterology) Truitt Merle, MD as Consulting Physician (Hematology) [name], MD as Consulting Physician (General Surgery)  Inpatient Treatment Team: Treatment Team: Attending Provider: Theodis Blaze, MD; Rounding Team: Redmond Baseman, MD; Registered Nurse: Alfonse Spruce, RN; Consulting Physician: [name], MD; Technician: Lucila Maine, NT; Registered Nurse: Zadie Rhine, RN; Technician: Desma Mcgregor; Registered Nurse: Roe Rutherford, RN; Student Nurse: Royston Bake, RN; Registered Nurse: Beryle Quant, RN; Registered Nurse: Derenda Fennel, RN; Technician: Mertha Baars, NT; Physical Therapist: Junius Argyle, PT; Physical Therapist: Charlott Holler, Student-PT  Problem List:   Principal Problem:   Pleural effusion Active Problems:   Hx pulmonary embolism   HTN (hypertension)   DVT (deep venous thrombosis)   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  Loculated left pleural effusion and chest pain s/p drainage & Abx  Plan: Continue antibiotics and drainage. - cultures from pleural effusion negative.  3-6 weeks Abx total most likely.    Continue diet as tolerated. Nutrition key - check nutrition labs.  Suspect much related to mod/severe malnutrition.  -Cont foley per  Urology w outpt f/u with Dr Griffin Basil via drains today.  Bowel regimen w probiotics.  -VTE prophylaxis- SCDs, etc  -Mobilize as tolerated to help recovery.    -Inc HR - ?HTN/Pain/anxiety - per IM.  Subjective:  No acute distress. Pt complains of new N/V beginning yesterday afternoon, still passing gas and having BM, phenergan helped. Able to tolerate oral meds this morning. Pain controlled with meds. Able to ambulate with nurses assistance. Vitals stable except tachycardia, no fever or tachypnea. Anemia, hyponatremia, and hypokalemia present. Minimal, green-tinged fluid from drains.  Objective:  Vital signs:  Filed Vitals:   05/16/14 0705 05/16/14 1407 05/16/14 2238 05/17/14 0629  BP: 120/57 118/62 126/58 125/64  Pulse: 111 113 126 122  Temp: 97.7 F (36.5 C) 97.8 F (36.6 C) 98.9 F (37.2 C) 98.4 F (36.9 C)  TempSrc: Oral Oral Oral Oral  Resp: 20 20 20 20   Height:      Weight: 189 lb 3.2 oz (85.821 kg)   187 lb 13.3 oz (85.2 kg)  SpO2: 94% 96% 97% 94%    Last BM Date: 05/17/14  Intake/Output   Yesterday:  04/17 0701 - 04/18 0700 In: 996 [P.O.:480; IV Piggyback:500] Out: 355 [Urine:250; Emesis/NG output:100; Drains:4; Stool:1]  Bowel function:  Flatus: y  BM: y  Drain: scant w green tinge  Physical Exam:  General: Pt awake/alert/oriented x4 in no acute distress Eyes: Sclera clear.  No icterus Psych:  No delerium/psychosis/paranoia.  Chatty/talkative HENT: Normocephalic. Chest: Mild chest wall pain on left side where thoracentesis was performed. CV: Tachycardic. Regular rhythm. Resp: Breathing non-labored. Abdomen: Soft.  Nondistended.  Mildly tender at LUQ/flank only.  No evidence of peritonitis.  No incarcerated hernias. Ext:  SCDs BLE.  No mjr edema.  No cyanosis Skin: No petechiae / purpura  Results:   Labs: Results for orders placed or performed during the hospital encounter of 05/09/14 (from the past 48 hour(s))  Amylase, Peritoneal  Fluid     Status: None   Collection Time: 05/15/14  3:24 PM  Result Value Ref Range   Amylase, peritoneal fluid 4287 U/L    Comment: (NOTE) Result confirmed by automatic dilution. Reference range: Values greater than or equal to three times a simultaneously analyzed serum value are considered abnormal in ascites of pancreatic origin. Other peritoneal and intestional disorders may also cause elevation of amylase. Performed at [facility]   CBC     Status: Abnormal   Collection Time: 05/16/14  4:22 AM  Result Value Ref Range   WBC 9.3 4.0 - 10.5 K/uL   RBC 3.02 (L) 3.87 - 5.11 MIL/uL   Hemoglobin 8.7 (L) 12.0 - 15.0 g/dL   HCT 28.3 (L) 36.0 - 46.0 %   MCV 93.7 78.0 - 100.0 fL   MCH 28.8 26.0 - 34.0 pg   MCHC 30.7 30.0 - 36.0 g/dL   RDW 16.2 (H) 11.5 - 15.5 %   Platelets 410 (H) 150 - 400 K/uL  Comprehensive metabolic panel     Status: Abnormal   Collection Time: 05/16/14  4:22 AM  Result Value Ref Range   Sodium 135 135 - 145 mmol/L   Potassium 3.2 (L) 3.5 - 5.1 mmol/L   Chloride 101 96 - 112 mmol/L   CO2 26 19 - 32 mmol/L   Glucose, Bld 107 (H) 70 - 99 mg/dL   BUN 8 6 - 23 mg/dL   Creatinine, Ser 1.05 0.50 - 1.10 mg/dL   Calcium 8.4 8.4 - 10.5 mg/dL   Total Protein 6.8 6.0 - 8.3 g/dL   Albumin 2.8 (L) 3.5 - 5.2 g/dL   AST 36 0 - 37 U/L   ALT 16 0 - 35 U/L   Alkaline Phosphatase 88 39 - 117 U/L   Total Bilirubin 0.3 0.3 - 1.2 mg/dL   GFR calc non Af Amer 54 (L) >90 mL/min   GFR calc Af Amer 62 (L) >90 mL/min    Comment: (NOTE) The eGFR has been calculated using the CKD EPI equation. This calculation has not been validated in all clinical situations. eGFR's persistently <90 mL/min signify possible Chronic Kidney Disease.    Anion gap 8 5 - 15  Magnesium     Status: None   Collection Time: 05/16/14  4:22 AM  Result Value Ref Range   Magnesium 1.9 1.5 - 2.5 mg/dL  Amylase     Status: None   Collection Time: 05/16/14  4:22 AM  Result Value Ref Range    Amylase 48 0 - 105 U/L  Amylase, Peritoneal Fluid     Status: None   Collection Time: 05/16/14  8:31 AM  Result Value Ref Range   Amylase, peritoneal fluid 9201 U/L    Comment: (NOTE) Result confirmed by automatic dilution. Result repeated and verified. Reference range: Values greater than or equal to three times a simultaneously analyzed serum value are considered abnormal in ascites of pancreatic origin. Other peritoneal and intestional disorders may also cause elevation of amylase. Performed at [facility]   Amylase, Peritoneal Fluid     Status: None   Collection Time: 05/16/14  8:32 AM  Result Value Ref Range   Amylase, peritoneal fluid 24 U/L    Comment: (NOTE) Reference range: Values  greater than or equal to three times a simultaneously analyzed serum value are considered abnormal in ascites of pancreatic origin. Other peritoneal and intestional disorders may also cause elevation of amylase. Performed at [facility]   CBC     Status: Abnormal   Collection Time: 05/17/14  4:17 AM  Result Value Ref Range   WBC 7.9 4.0 - 10.5 K/uL   RBC 2.95 (L) 3.87 - 5.11 MIL/uL   Hemoglobin 8.7 (L) 12.0 - 15.0 g/dL   HCT 27.5 (L) 36.0 - 46.0 %   MCV 93.2 78.0 - 100.0 fL   MCH 29.5 26.0 - 34.0 pg   MCHC 31.6 30.0 - 36.0 g/dL   RDW 15.9 (H) 11.5 - 15.5 %   Platelets 420 (H) 150 - 400 K/uL  Basic metabolic panel     Status: Abnormal   Collection Time: 05/17/14  4:17 AM  Result Value Ref Range   Sodium 133 (L) 135 - 145 mmol/L   Potassium 3.0 (L) 3.5 - 5.1 mmol/L   Chloride 99 96 - 112 mmol/L   CO2 24 19 - 32 mmol/L   Glucose, Bld 94 70 - 99 mg/dL   BUN 6 6 - 23 mg/dL   Creatinine, Ser 0.79 0.50 - 1.10 mg/dL   Calcium 8.3 (L) 8.4 - 10.5 mg/dL   GFR calc non Af Amer 84 (L) >90 mL/min   GFR calc Af Amer >90 >90 mL/min    Comment: (NOTE) The eGFR has been calculated using the CKD EPI equation. This calculation has not been validated in all clinical  situations. eGFR's persistently <90 mL/min signify possible Chronic Kidney Disease.    Anion gap 10 5 - 15  Magnesium     Status: None   Collection Time: 05/17/14  4:17 AM  Result Value Ref Range   Magnesium 1.8 1.5 - 2.5 mg/dL    Imaging / Studies: No results found.  Medications / Allergies: per chart  Antibiotics: Anti-infectives    Start     Dose/Rate Route Frequency Ordered Stop   05/16/14 2000  ciprofloxacin (CIPRO) tablet 500 mg     500 mg Oral 2 times daily 05/16/14 1624     05/16/14 1700  metroNIDAZOLE (FLAGYL) tablet 500 mg     500 mg Oral 3 times per day 05/16/14 1607     05/10/14 2000  ciprofloxacin (CIPRO) IVPB 400 mg  Status:  Discontinued     400 mg 200 mL/hr over 60 Minutes Intravenous Every 12 hours 05/10/14 1758 05/16/14 1624   05/10/14 1800  piperacillin-tazobactam (ZOSYN) IVPB 3.375 g  Status:  Discontinued     3.375 g 12.5 mL/hr over 240 Minutes Intravenous Every 8 hours 05/10/14 1743 05/16/14 1607   05/10/14 1100  doxycycline (VIBRA-TABS) tablet 100 mg  Status:  Discontinued     100 mg Oral 2 times daily 05/10/14 1008 05/11/14 2251       Barbara Belleau, PA-S [facility] [phone] 05/17/2014  9:38 AM

## 2014-05-17 NOTE — Progress Notes (Signed)
PT Cancellation Note  Patient Details Name: [name] MRN: [id number] DOB: 06/23/1945   Cancelled Treatment:     pt about to go down to Radiation.  Will check back as schedule permits.   Nathanial Rancher 05/17/2014, 3:02 PM

## 2014-05-17 NOTE — Progress Notes (Signed)
Bladder scanned pt, pt had 329ml of urine in bladder. Pt c/o no lower abdominal discomfort. Encouraged pt to sit on [facility], pt agreed. Will check back with pt.

## 2014-05-17 NOTE — Progress Notes (Signed)
CSW spoke with patient's daughter, Michalle who confirmed that they have chosen [facility] at discharge. CSW made [place] [facility] [place] aware.   Clinical Social Work Department CLINICAL SOCIAL WORK PLACEMENT NOTE 05/17/2014  Patient:  ZIA, NAJERA  Account Number:  [id number] [place] date:  05/09/2014  Clinical Social Worker:  Maryln Manuel  Date/time:  05/11/2014 12:15 PM  Clinical Social Work is seeking post-discharge placement for this patient at the following level of care:   SKILLED NURSING   (*CSW will update this form in Epic as items are completed)   05/11/2014  Patient/family provided with [place] Department of Clinical Social Work's list of facilities offering this level of care within the geographic area requested by the patient (or if unable, by the patient's family).  05/11/2014  Patient/family informed of their freedom to choose among providers that offer the needed level of care, that participate in Medicare, Medicaid or managed care program needed by the patient, have an available bed and are willing to accept the patient.  05/11/2014  Patient/family informed of [facility]' ownership interest in [facility], as well as of the fact that they are under no obligation to receive care at this facility.  PASARR submitted to EDS on 05/11/2014 PASARR number received on 05/11/2014  FL2 transmitted to all facilities in geographic area requested by pt/family on  05/11/2014 FL2 transmitted to all facilities within larger geographic area on   Patient informed that his/her managed care company has contracts with or will negotiate with  certain facilities, including the following:     Patient/family informed of bed offers received:  05/17/2014 Patient chooses bed at [facility] Physician recommends and patient chooses bed at    Patient to be transferred to [facility] on   Patient to be transferred to facility by  Patient and  family notified of transfer on  Name of family member notified:    The following physician request were entered in Epic:   Additional Comments:    Raynaldo Opitz, [place] Social Worker cell #: [phone]

## 2014-05-18 ENCOUNTER — Inpatient Hospital Stay (HOSPITAL_COMMUNITY): Payer: Medicare Other

## 2014-05-18 DIAGNOSIS — R002 Palpitations: Secondary | ICD-10-CM

## 2014-05-18 LAB — BASIC METABOLIC PANEL
Anion gap: 8 (ref 5–15)
BUN: 8 mg/dL (ref 6–23)
CHLORIDE: 104 mmol/L (ref 96–112)
CO2: 23 mmol/L (ref 19–32)
Calcium: 8.7 mg/dL (ref 8.4–10.5)
Creatinine, Ser: 0.89 mg/dL (ref 0.50–1.10)
GFR calc Af Amer: 76 mL/min — ABNORMAL LOW (ref >90)
GFR calc non Af Amer: 66 mL/min — ABNORMAL LOW (ref >90)
Glucose, Bld: 113 mg/dL — ABNORMAL HIGH (ref 70–99)
POTASSIUM: 4.3 mmol/L (ref 3.5–5.1)
Sodium: 135 mmol/L (ref 135–145)

## 2014-05-18 LAB — CBC
HCT: 30.6 % — ABNORMAL LOW (ref 36.0–46.0)
Hemoglobin: 9.4 g/dL — ABNORMAL LOW (ref 12.0–15.0)
MCH: 28.8 pg (ref 26.0–34.0)
MCHC: 30.7 g/dL (ref 30.0–36.0)
MCV: 93.9 fL (ref 78.0–100.0)
Platelets: 536 10*3/uL — ABNORMAL HIGH (ref 150–400)
RBC: 3.26 MIL/uL — ABNORMAL LOW (ref 3.87–5.11)
RDW: 16.3 % — AB (ref 11.5–15.5)
WBC: 11.2 10*3/uL — ABNORMAL HIGH (ref 4.0–10.5)

## 2014-05-18 LAB — PREALBUMIN: Prealbumin: 10 mg/dL — ABNORMAL LOW (ref 17–34)

## 2014-05-18 LAB — MAGNESIUM: MAGNESIUM: 1.7 mg/dL (ref 1.5–2.5)

## 2014-05-18 MED ORDER — PROMETHAZINE HCL 25 MG/ML IJ SOLN
12.5000 mg | Freq: Four times a day (QID) | INTRAMUSCULAR | Status: DC | PRN
  Administered 2014-05-18: 12.5 mg via INTRAVENOUS
  Filled 2014-05-18: qty 1

## 2014-05-18 MED ORDER — IOHEXOL 300 MG/ML  SOLN
80.0000 mL | Freq: Once | INTRAMUSCULAR | Status: AC | PRN
  Administered 2014-05-18: 80 mL via INTRAVENOUS

## 2014-05-18 MED ORDER — SODIUM CHLORIDE 0.9 % IV SOLN
INTRAVENOUS | Status: DC
  Administered 2014-05-18: 17:00:00 via INTRAVENOUS

## 2014-05-18 NOTE — Progress Notes (Signed)
Physical Therapy Treatment Patient Details Name: [name] MRN: [id number] DOB: [birth year] Today's Date: 05/18/2014    History of Present Illness 69 y.o. 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 She has followed with Dr Burr Medico with plan is to start chemo soon. Pt presented with left sided chest pain and upper abd pain and was found to have moderate sized pleural effusion requiring IR guided left thoracentesis 4/11.     PT Comments    Pt familiar to me from prior admission.  Assisted OOB to amb limited distance.  Noted increased RR and pt c/o SOB.  RA 100%, HR 148.  Returned to room and RN called to access.  HR stayed in 130's.  Assisted back to bed for Radiation.  Follow Up Recommendations  SNF     Equipment Recommendations       Recommendations for Other Services       Precautions / Restrictions Precautions Precautions: Fall Precaution Comments: 2 L sided drains Restrictions Weight Bearing Restrictions: No    Mobility  Bed Mobility Overal bed mobility: Needs Assistance Bed Mobility: Supine to Sit;Sit to Supine     Supine to sit: Min assist;HOB elevated Sit to supine: Min assist   General bed mobility comments: pt places HOB elevated   Transfers Overall transfer level: Needs assistance Equipment used: Rolling walker (2 wheeled) Transfers: Sit to/from Stand Sit to Stand: Mod assist;Max assist         General transfer comment: pt repeats "I need a boost up".  pt self limits her ability to perform sit to stand despite cueing/encouragement.   Ambulation/Gait Ambulation/Gait assistance: Min assist Ambulation Distance (Feet): 125 Feet Assistive device: Rolling walker (2  wheeled) Gait Pattern/deviations: Step-through pattern;Decreased stride length;Trunk flexed Gait velocity: decreased   General Gait Details: tolerated increased amb distance but at end c/o SOB and mild tremors with noted increased RR.  Radial pulse taken 148.  Recliner brought to pt and returned to room.  Pt appeared wooried so called RN to room.  HR stayed in 130's.  Max c/o fatigue.     Stairs            Wheelchair Mobility    Modified Rankin (Stroke Patients Only)       Balance                                    Cognition Arousal/Alertness: Awake/alert Behavior During Therapy: WFL for tasks assessed/performed Overall Cognitive Status: Within Functional Limits for tasks assessed                      Exercises      General Comments        Pertinent Vitals/Pain Pain Assessment: No/denies pain    Home Living                      Prior Function            PT Goals (current goals can now be found in the care plan section) Progress towards PT goals: Progressing toward goals    Frequency  Min 3X/week    PT Plan  Co-evaluation             End of Session Equipment Utilized During Treatment: Gait belt Activity Tolerance: Patient limited by fatigue Patient left: in bed;with call bell/phone within reach     Time: 9643-8381 PT Time Calculation (min) (ACUTE ONLY): 30 min  Charges:  $Gait Training: 8-22 mins $Therapeutic Activity: 8-22 mins                    G Codes:      [name]  PTA [facility]  Acute  Rehab Pager      [phone]

## 2014-05-18 NOTE — Progress Notes (Signed)
ANTICOAGULATION CONSULT NOTE - Follow Up  Pharmacy Consult for Lovenox Indication: hx PE and recurrent DVTs  Allergies  Allergen Reactions  . Sulfa Antibiotics Swelling    Patient Measurements: Height: 5' 9.5" (176.5 cm) Weight: 189 lb 6.4 oz (85.911 kg) IBW/kg (Calculated) : 67.35  Vital Signs: Temp: 98 F (36.7 C) (04/19 0520) Temp Source: Oral (04/19 0520) BP: 119/61 mmHg (04/19 0520) Pulse Rate: 119 (04/19 0520)  Labs:  Recent Labs  05/16/14 0422 05/17/14 0417 05/18/14 0532  HGB 8.7* 8.7* 9.4*  HCT 28.3* 27.5* 30.6*  PLT 410* 420* 536*  CREATININE 1.05 0.79 0.89    Estimated Creatinine Clearance: 72.4 mL/min (by C-G formula based on Cr of 0.89).  Assessment: 44 YOF presented 05/08/14 with lower left-sided abdominal pain s/p complex abdominal surgery (pancreas, spleen, colon, & kidney). 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 Lovenox transitioned to heparin on admit in anticipation of thoracentesis due to chest x-ray confirmation of pleural effusion on 05/09/14. Chest CT angio on 4/10 negative for new PE. S/p thoracentesis on 05/14/14. With no further surgical interventions planned, transitioned patient back to Lovenox 05/12/14.  Today, 05/18/2014   Weight trended back down and is stable. Appears may have increased a few days ago due to positive net I/O.    CBC: H/H low, but stable. Hgb 9.4, PLTs elevated at 536  Renal function improved- Scr 0.89 mg/dL and CrCl ~72 mL/min No bleeding/complications per RN  Goal of Therapy:  Appropriate anticoagulation for history of DVT    Plan:  Continue Lovenox 130 mg SQ q24h (~1.5 mg/kg) Note increase in dose for weight change from the patient's PTA dose of 120mg .  She will need a new prescription for 150mg  syringes at discharge.   Gloriajean Dell, PharmD Candidate  Gretta Arab PharmD, BCPS Pager [phone] 05/18/2014 10:48 AM

## 2014-05-18 NOTE — Consult Note (Signed)
CARDIOLOGY CONSULT NOTE   Patient ID: KAYLIA WINBORNE MRN: [id number] DOB/AGE: [birth year] 68 y.o.  Admit date: 05/09/2014  Primary Physician   Mauricio Po, [facility] Primary Cardiologist   New Reason for Consultation   Tachycardia  VWP:[id number] A Doi is a 69 y.o. year old female with a history of DVT, PE, on coumadin, elevated HR on Cardizem in 2010. She had been off coumadin when she was found to have a DVT in her L leg 11/2013. She was started on Xarelto.   She saw Dr Burr Medico in January 2016 for pancreatic mass and was complaining of leg pain and swelling, also had problems affording the Xarelto. Changed to Lovenox.  After that, she diagnosed w/ pancreatic CA. She had distal pancreatectomy, splenectomy and partial colectomy on 04/08/2014. She went to rehab after the surgery and was admitted 04/10 to [facility] for L lower chest pain with inspiration, abdominal pain and vomiting. She was diagnosed with a L pleural effusion. Her Lovenox was held initially held, but resumed on 04/13. She has had thoracentesis twice. Cardiology was asked to see her for tachycardia. CXR is improved, but still w/ L effusion.  She had DOE, but is not SOB at rest. Denies PND or orthopnea. No cough, no GERD. Still tender in her abdomen and where the chest tube was lower L chest.   Past Medical History  Diagnosis Date  . Glaucoma   . Clotting disorder   . DVT (deep venous thrombosis)     LEFT  . GERD (gastroesophageal reflux disease)   . Hypercholesterolemia   . Peripheral neuropathy   . Essential tremor   . Diverticulosis   . Arthritis     knees and back  . Cancer     Pancreatic  . Hypertension   . Colon polyps   . UTI (lower urinary tract infection)   . Complication of anesthesia     slow to wak up   . Pulmonary embolism      Past Surgical History  Procedure Laterality Date  . Cholecystectomy    . Abdominal hysterectomy    . Knee surgery Bilateral   . [place] filter    . Back surgery    .  Eus N/A 02/26/2014    Procedure: UPPER ENDOSCOPIC ULTRASOUND (EUS) LINEAR;  Surgeon: Beryle Beams, MD;  Location: [facility] ENDOSCOPY;  Service: Endoscopy;  Laterality: N/A;  . Fine needle aspiration N/A 02/26/2014    Procedure: FINE NEEDLE ASPIRATION (FNA) LINEAR;  Surgeon: Beryle Beams, MD;  Location: [facility] ENDOSCOPY;  Service: Endoscopy;  Laterality: N/A;  . Breast lumpectomy Right   . Spine surgery    . Xi robotic assisted laparoscopic distal pancreatectomy N/A 04/08/2014    Procedure: XI ROBOTIC ASSISTED CONVERTED LAPAROSCOPIC HAND ASSITED DISTAL PANCREATECTOMY/SPLENECTOMY WITH PARTIAL COLECTOMY;  Surgeon: [name], MD;  Location: [facility] ORS;  Service: General;  Laterality: N/A;  . Colon surgery    . Diagnostic laparoscopy      Allergies  Allergen Reactions  . Sulfa Antibiotics Swelling    I have reviewed the patient's current medications . acetaminophen  500 mg Oral TID WC & HS  . ciprofloxacin  500 mg Oral BID  . diltiazem  30 mg Oral 4 times per day  . enoxaparin (LOVENOX) injection  1.5 mg/kg Subcutaneous Q24H  . latanoprost  1 drop Both Eyes QHS  . lip balm  1 application Topical BID  . metroNIDAZOLE  500 mg Oral 3 times per day  .  pantoprazole  40 mg Oral BID AC  . primidone  50 mg Oral QHS  . psyllium  1 packet Oral Daily  . saccharomyces boulardii  250 mg Oral BID  . sodium chloride  3 mL Intravenous Q12H  . sucralfate  1 g Oral TID WC & HS  . tamsulosin  0.4 mg Oral Daily   . sodium chloride 100 mL/hr at 05/18/14 1342   sodium chloride, alum & mag hydroxide-simeth, bismuth subsalicylate, HYDROcodone-acetaminophen, magic mouthwash, menthol-cetylpyridinium, metoprolol, morphine injection, phenol, promethazine, sodium chloride  Medication Sig  atorvastatin (LIPITOR) 40 MG tablet Take 1 tablet (40 mg total) by mouth daily.  diltiazem (CARTIA XT) 120 MG 24 hr capsule Take 1 capsule (120 mg total) by mouth daily.  DOXYCYCLINE HYCLATE PO Take 100 mg by mouth 2 (two) times daily.   enoxaparin (LOVENOX) 120 MG/0.8ML injection Inject 0.8 mLs (120 mg total) into the skin daily.  latanoprost (XALATAN) 0.005 % ophthalmic solution Place 1 drop into both eyes at bedtime.  pantoprazole (PROTONIX) 40 MG tablet TAKE 1 TABLET BY MOUTH DAILY  primidone (MYSOLINE) 50 MG tablet Take 50 mg by mouth at bedtime. Patient takes at nite  propranolol ER (INDERAL LA) 120 MG 24 hr capsule TAKE ONE CAPSULE BY MOUTH DAILY  sucralfate (CARAFATE) 1 GM/10ML suspension Take 10 mLs (1 g total) by mouth 4 (four) times daily -  with meals and at bedtime.  tamsulosin (FLOMAX) 0.4 MG CAPS capsule Take 1 capsule (0.4 mg total) by mouth daily.  traMADol (ULTRAM) 50 MG tablet Take 1 tablet (50 mg total) by mouth every 6 (six) hours as needed.  loperamide (IMODIUM) 2 MG capsule Take 1 capsule (2 mg total) by mouth 3 (three) times daily before meals.     History   Social History  . Marital Status: Divorced    Spouse Name: N/A  . Number of Children: 3  . Years of Education: 14   Occupational History  . Retired    Social History Main Topics  . Smoking status: Current Every Day Smoker -- 0.50 packs/day for 45 years    Types: Cigarettes  . Smokeless tobacco: Never Used  . Alcohol Use: 0.0 oz/week    0 Standard drinks or equivalent per week     Comment: occ- glass of brandy or wine socially   . Drug Use: No  . Sexual Activity: Not on file   Other Topics Concern  . Not on file   Social History Narrative   Divorced, 3 children   Right handed   Associates degree   3 per week   Fun: Sleep, watch tv, reading   Denies any religious beliefs that effect health care    Family Status  Relation Status Death Age  . Father Deceased 66    Prostate/Bladder Cancer  . Maternal Grandmother Deceased   . Mother Deceased 80    Heart problems  . Sister Alive     breast cancer  . Daughter Alive     healthy  . Son Alive     asthma, hypercholesterolemia, OSA  . Maternal Grandfather Deceased   . Paternal  Grandmother Deceased   . Paternal Grandfather Deceased   . Son Alive     healthy   Family History  Problem Relation Age of Onset  . Diabetes Father   . Cancer Father 68    prostate  . Hyperlipidemia Father   . Cataracts Maternal Grandmother   . Hypertension Mother   . Arthritis Mother   .  Heart disease Mother   . Glaucoma Sister   . Cancer Sister 62    Breast  . Cancer Paternal Uncle     throat +cig  . Diabetes Paternal Grandmother      ROS:  Full 14 point review of systems complete and found to be negative unless listed above.  Physical Exam: Blood pressure 102/63, pulse 132, temperature 98 F (36.7 C), temperature source Oral, resp. rate 20, height 5' 9.5" (1.765 m), weight 189 lb 6.4 oz (85.911 kg), SpO2 100 %.  General: Well developed, well nourished, female in no acute distress Head: Eyes PERRLA, No xanthomas.   Normocephalic and atraumatic, oropharynx without edema or exudate. Dentition: good Lungs: Decreased BS bases, L>R Heart: HRRR S1 S2, no rub/gallop, no murmur. pulses are 2+ all 4 extrem.   Neck: No carotid bruits. No lymphadenopathy.  JVD 9 cm. Abdomen: Bowel sounds present, abdomen soft and non-tender without masses or hernias noted. Msk:  No spine or cva tenderness. No weakness, no joint deformities or effusions. Extremities: No clubbing or cyanosis. trace edema.  Neuro: Alert and oriented X 3. No focal deficits noted. Psych:  Good affect, responds appropriately Skin: No rashes or lesions noted.  Labs:   Lab Results  Component Value Date   WBC 11.2* 05/18/2014   HGB 9.4* 05/18/2014   HCT 30.6* 05/18/2014   MCV 93.9 05/18/2014   PLT 536* 05/18/2014   No results for input(s): INR in the last 72 hours.  Recent Labs Lab 05/16/14 0422  05/18/14 0532  NA 135  < > 135  K 3.2*  < > 4.3  CL 101  < > 104  CO2 26  < > 23  BUN 8  < > 8  CREATININE 1.05  < > 0.89  CALCIUM 8.4  < > 8.7  PROT 6.8  --   --   BILITOT 0.3  --   --   ALKPHOS 88  --   --     ALT 16  --   --   AST 36  --   --   GLUCOSE 107*  < > 113*  ALBUMIN 2.8*  --   --   < > = values in this interval not displayed. MAGNESIUM  Date Value Ref Range Status  05/18/2014 1.7 1.5 - 2.5 mg/dL Final   Lab Results  Component Value Date   CHOL 167 05/03/2013   HDL 68 05/03/2013   LDLCALC 79 05/03/2013   TRIG 82 04/30/2014   Urinalysis    Component Value Date/Time   COLORURINE YELLOW 04/29/2014 1034   APPEARANCEUR CLEAR 04/29/2014 1034   LABSPEC 1.010 04/29/2014 1034   PHURINE 6.0 04/29/2014 1034   GLUCOSEU NEGATIVE 04/29/2014 1034   HGBUR SMALL* 04/29/2014 1034   BILIRUBINUR NEGATIVE 04/29/2014 1034   KETONESUR 15* 04/29/2014 1034   PROTEINUR NEGATIVE 04/29/2014 1034   UROBILINOGEN 0.2 04/29/2014 1034   NITRITE NEGATIVE 04/29/2014 1034   LEUKOCYTESUR SMALL* 04/29/2014 1034   ECG: pending  Radiology:  Ct Chest W Contrast 05/18/2014   CLINICAL DATA:  Loculated left pleural effusion, chest pain.  EXAM: CT CHEST WITH CONTRAST  TECHNIQUE: Multidetector CT imaging of the chest was performed during intravenous contrast administration.  CONTRAST:  48mL OMNIPAQUE IOHEXOL 300 MG/ML  SOLN  COMPARISON:  CT scan of May 09, 2014.  FINDINGS: No pneumothorax is noted. Mild to moderate left pleural effusion is noted with associated atelectasis of the left lower lobe. The superior portion of this effusion appears to  be loculated. There is no evidence of thoracic aortic dissection or aneurysm. Visualized portions of pulmonary arteries appear normal. No mediastinal mass or adenopathy is noted no significant osseous abnormality is noted. Percutaneous drain is again noted between the stomach and left kidney.  IMPRESSION: Mild to moderate left pleural effusion is noted with adjacent atelectasis of the left lower lobe. The smaller superior portion of this effusion appears to be loculated. This appears to be smaller compared to prior exam.   Electronically Signed   By: Marijo Conception, M.D.   On:  05/18/2014 12:13   Ir Sinus/fist Tube Chk-non Gi 05/17/2014   CLINICAL DATA:  Pancreatic carcinoma, post distal pancreatectomy and splenectomy with persistent leak. Surgical drain remains in place into pigtail catheter had been placed into a retroperitoneal collection ; output has tapered off.  EXAM: RETROPERITONEAL DRAIN CATHETER INJECTION UNDER FLUOROSCOPY  TECHNIQUE: The procedure, risks (including but not limited to bleeding, infection, organ damage ), benefits, and alternatives were explained to the patient. Questions regarding the procedure were encouraged and answered. The patient understands and consents to the procedure.  Survey fluoroscopic inspection reveals stable position of pigtail catheter in the left upper abdomen and surgical drain to the subphrenic space. A small left pleural effusion is noted.  Injection of the left upper quadrant percutaneous pigtail drain catheter demonstrates filling of a small irregular cavity around the drain catheter. With continued injection, contrast tracks back along the catheter and exits the skin entry site. There is no opacification of the pancreatic duct or biliary tree. With aspiration, most of the injected contrast can be removed.  After telephone consultation with Dr. Barry Dienes, the decision was made to proceed with injection of the left lateral surgical drain catheter. However, the catheter is occluded such that injection using a moderately vigorous pressure did not result in any contrast entering the drain catheter. Inspection demonstrates that the retention sutures is not occlusive. High pressure injection was deferred. Both catheters were returned to bulb drainage.  The patient tolerated the procedure well. No immediate complication.  IMPRESSION: 1. Small residual cavity around the pigtail drain catheter, with NO communication to the pancreatic duct or biliary tree opacified. 2. Occlusion of surgical drain catheter.   Electronically Signed   By: Lucrezia Europe M.D.    On: 05/17/2014 15:03    ASSESSMENT AND PLAN:   The patient was seen today by Dr Harrington Challenger, the patient evaluated and the data reviewed.   Tachycardia:  - asymptomatic - unclear source, has been SR/ST on strip review, not currently on telemetry. ECG pending - CT showed improved effusion, done w/out PE protocol - SBP 100s-120s, will ck orthostatics - ck echo - recheck UA  Otherwise, per IM, Surgery Principal Problem:   Pleural effusion Active Problems:   Hx pulmonary embolism   HTN (hypertension)   DVT (deep venous thrombosis)   Pancreas cancer of tail s/p distal pancreatectomy, splenectomy, & partial colectomy 04/08/2014   Malnutrition of moderate degree   Intra-abdominal abscess   Peripheral neuropathy   Chest pain   Anemia of chronic disease   Signed: Rosaria Ferries, PA-C 05/18/2014 4:25 PM Beeper [phone]  Patinet seen and examined  I agree with findings as noted by R Barrett above Patient has history of tachycardia in past  Treated with dilitiazem for years. Also has history of DVT/PE Presents for L sided CP  Treated for L pleural effusion. Patient has been tachycardia.  Strips show SR/ST   She is rel asymptomatic.  EKG  with ST  Poor R wave progression.    Sinus tachycardia is driven by other things (pain, fever, infection, anemia, dehydration), Would check orthostatics, UA for specific gravity and infection.  Check TSH. Get some of resluts before treating .   WIll continue to follow.    Dorris Carnes

## 2014-05-18 NOTE — Progress Notes (Addendum)
Patient ID: [name], female   DOB: 1945-12-27, 69 y.o.   MRN: [id number]  [facility] PROGRESS NOTE  [name] PIR:[id number] DOB: 05-14-1945 DOA: 05/09/2014 PCP: Mauricio Po, FNP   Brief narrative:    69 y.o. 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 She has followed with Dr Burr Medico with plan is to start chemo soon. Pt presented with left sided chest pain and upper abd pain and was found to have moderate sized pleural effusion requiring IR guided left thoracentesis 4/11 with removal of 500 cc of fluid.   Please note that hospital course has been complicated by persistent loculated pleural effusion possibly causing persistent tachycardia and requiring consultation by cardiothoracic and cardiology team, respectively. Once the issue of pleural effusion has been addressed and tachycardia better controlled, pt could possibly be discharged to SNF. Daughter has been updated daily per pt's request.   Major events since admission: 4/15 - CXR with worsening pleural effusion, IR re consulted for consideration of repeat thoracentesis, only 11 cc able to remove due to significant loculation of the fluid  4/16 - pt reports feeling better, pleural fluid analysis with no growth 4/17 - d/c with Dr. Tommy Medal, no need for double Pseudomonas coverage, rec's to d/c Zosyn, continue Cipro and add Flagyl  4/18 - pt reports not voiding on her own and aware may need longer term foley cath but hesitant to have it placed today  4/19 - cardiothoracic surgery consulted for further recommendation on pleural effusion, cardiology consulted for persistent tachycardia, also WBC up from 7.9 --> 11.2 and CT chest requested per  Dr. Barry Dienes   Assessment/Plan:    Principal Problem:   Left chest/ abdominal pain - partly due to drains and possibly due to effusion, ? parapneumonic effusion related to recent LUQ abscess  - now status post left thoracentesis 4/11 with 500 cc fluid removed, exudate with no bact growth to date  - d/w ID on call, no need for double coverage for Pseudomonas - recommendation was to d/c Zosyn and continue Cipro with addition of Flagyl due to risk of C.Diff (changes made 4/17) - repeat CXR 4/16 notable for worsening pleural effusion, repeat thoracentesis 4/16 with only 10 cc of fluid able to remove  - Dr. Barry Dienes now consulted cardiothoracic surgery team for further assistance on pleural effusion, also CT chest requested for follow upon pleural effusion status   Moderate to large acute left pleural effusion - possibly related to extension of the abd abscess - still fairly diminished breath sounds on the left side, s/p thoracentesis 4/11 (500 cc removed) and again 4/16 (only 10 cc removed on 4/16) - CXR 4/16 with worsening effusion but respiratory status remains stable - pleural fluid with no bacterial growth to date - WBC up over the past 24 hours from 7.9 --> 11.2, unclear etiology, CT chest requested per surgery team to follow upon plural fluid, may also need repeat CT abd if CT chest with no worsening findings    LUQ abscess with recent cultures growing Pseudomonas - status post drainage on 3/18 with cultures positive for Pseudomonas  - WBC continued trending down until this AM 4/19 WBC up from 7.9 --> 11.2  - repeat CT abd 4/12 with  decreasing size of the abscess  - on 4/17 stopped Zosyn and continued Cipro, added flagyl per ID Dr. Tommy Medal recommendations  - pt so far tolerating well   Slight volume overload - weight since admission from 190 to 195 lbs on 4/12 --> 203 lbs --> 193 lbs --> 191 lbs --> 189  lbs this AM - stopped IVF 4/13 and gave low dose lasix 20 mg IV x 1 dose (4/13), gave  additional dose of Lasix  4/14 and 4/15 - has been off Lasix since 4/16 and weight continues trending down  - monitor weight and strict I's and O's   Persistent tachycardia - pt fairly asymptomatic with HR in 120- 130s - cardiology team consulted for further assistance  - started on Cardizem 30 mg Q6 hours    Hypokalemia - supplemented and WNL  - continue to supplement electrolytes as indicated and repeat BMP and Mg level in AM   Prior DVT s/p IVC filter - continue Lovenox  per pharmacy    Acute renal failure - pre renal  - Cr remains WNL    Anemia of acute illness imposed on anemia of chronic disease, malignancy - no signs of active bleeding, Hg remains stable  - no indication for transfusion at this time  - repeat CBC in AM   Sepsis secondary to the above, L pleural effusion, ? parapneumonic - on 4/11 - WBC 15 K, T 100F, HR in 110 with BP 100/33 - continue ABX as noted above - WBC continued trending down until this AM 4/19, WBC up from 7.9 --> 11.2 - CT chest requested as noted above    Pancreatic cancer of tail, s/p distal pancreatectomy, splenectomy, & partial colectomy 04/08/2014 - will need to have close outpatient follow up, will need to try to help schedule necessary appointment prior to discharge  - orders placed to assist with scheduling    Severe PCM - in the context of acute illness - nutritionist consulted, pt tolerating diet well but still poor oral intake    Urinary retention - despite multiple voiding trails after surgery, she continued to retain urine - foley taken out on 4/16 but since still with urinary retention, pt made aware she needs foley to be placed back - initially pt reluctant to have foley placed but in agreement this afternoon    Tremors   - on Primidone and stable at this time    Acute functional quadriplegia - in the setting of acute illness - still very weak but trying to participate in PT   DVT prophylaxis - on Lovenox full dose   Code  Status: Full.  Family Communication:  plan of care discussed with the patient Disposition Plan: Not ready for d/c, awaiting input from cardiothoracic surgery team, also cardiology consulted for persistent tachycardia   IV access:  Peripheral IV  Procedures and diagnostic studies:    Ct Abdomen Pelvis Wo Contrast  05/11/2014   Two left upper quadrant surgical drains remain in place and unchanged in position. No significant fluid collection within the left upper quadrant.  Slight interval decrease in size of a fluid collection over the surgical bed adjacent the distal pancreatectomy measuring 2.1 x 2.7 cm (previously 2.7 x 3 cm).  Interval improvement small amount of free pelvic fluid.  Stable stranding of the mesenteric fat adjacent the colonic anastomosis in the left mid abdomen likely postsurgical change.  Other stable chronic changes within the abdomen.  Continued moderate left pleural effusion with associated compressive  atelectasis. Tiny right pleural fluid collection with associated atelectasis.      Dg Chest 1 View   05/10/2014   Left effusion smaller following thoracentesis. There is a small left effusion with left base atelectasis. Subpulmonic chest tube remains on the left as does a drain in the upper abdomen on the left. Right lung is clear. No change in cardiac silhouette. No demonstrable pneumothorax.     Dg Chest 2 View  05/09/2014   Progressed left pleural effusion, now moderate to large. Associated decreased ventilation in the left lung. 2. Lower lung volumes.     Ct Angio Chest Pe W/cm &/or Wo Cm  05/09/2014   No evidence of pulmonary embolus.  Moderate left pleural effusion is noted with associated atelectasis of the left lower lobe.  Continued presence of drainage catheter seen between the stomach and left kidney, as well as continued and stable appearance of fluid collection medial to proximal stomach.   Dg Abd Acute W/chest  05/08/2014  Nonobstructive bowel gas pattern. Left pleural  effusion  Similar position of left upper quadrant surgical drains.    US Thoracentesis Asp Pleural Space W/img Guide 05/10/2014  Successful ultrasound-guided left sided thoracentesis yielding 500 ml of pleural fluid.    Medical Consultants:  PCCM - has signed off on 4/16 IR Surgery  Cardiology  Cardiothoracic surgery   Other Consultants:  PT/OT  IAnti-Infectives:   Zosyn 4/10 --> 4/17  Cipro 4/10 --> Flagyl 4/17 -->  Faye Ramsay, MD  [facility] Pager [phone]  If 7PM-7AM, please contact night-coverage [email or web address] Password [email or web address] 05/18/2014, 7:12 PM   LOS: 9 days   HPI/Subjective: Pt concerned with pleural effusion and plan of further management.   Objective: Filed Vitals:   05/18/14 0520 05/18/14 1100 05/18/14 1335 05/18/14 1448  BP: 119/61  111/72 102/63  Pulse: 119 148  132  Temp: 98 F (36.7 C)   98 F (36.7 C)  TempSrc: Oral   Oral  Resp: 20   20  Height:      Weight: 85.911 kg (189 lb 6.4 oz)     SpO2: 97%   100%    Intake/Output Summary (Last 24 hours) at 05/18/14 1912 Last data filed at 05/18/14 1448  Gross per 24 hour  Intake    120 ml  Output     15 ml  Net    105 ml    Exam:   General:  Pt is tired but looks better this AM  Cardiovascular: Regular rhythm, tachycardic, S1/S2, no rubs, no gallops  Respiratory: Diminished air movement on the left side, no crackles   Abdomen: Soft, non tender in epigastric area, non distended, bowel sounds present, drains in place   Extremities: pulses DP and PT palpable bilaterally  Data Reviewed: Basic Metabolic Panel:  Recent Labs Lab 05/13/14 0523 05/14/14 0512 05/15/14 0846 05/16/14 0422 05/17/14 0417 05/18/14 0532  NA 138 137  --  135 133* 135  K 3.2* 3.2*  --  3.2* 3.0* 4.3  CL 107 103  --  101 99 104  CO2 23 24  --  26 24 23   GLUCOSE 97 104*  --  107* 94 113*  BUN 11 8  --  8 6 8   CREATININE 1.07 0.99  --  1.05 0.79 0.89  CALCIUM 8.3* 8.4  --  8.4 8.3* 8.7  MG  --   --  1.3* 1.9 1.8  1.7   Liver Function Tests:  Recent Labs Lab 05/16/14 0422  AST 36  ALT 16  ALKPHOS 88  BILITOT 0.3  PROT 6.8  ALBUMIN 2.8*    Recent Labs Lab 05/16/14 0422  AMYLASE 48   CBC:  Recent Labs Lab 05/12/14 0335 05/14/14 0512 05/16/14 0422 05/17/14 0417 05/18/14 0532  WBC 9.2 7.3 9.3 7.9 11.2*  HGB 8.3* 8.8* 8.7* 8.7* 9.4*  HCT 26.7* 28.7* 28.3* 27.5* 30.6*  MCV 95.0 94.7 93.7 93.2 93.9  PLT 351 431* 410* 420* 536*    Recent Results (from the past 240 hour(s))  MRSA PCR Screening     Status: None   Collection Time: 05/09/14 12:27 PM  Result Value Ref Range Status   MRSA by PCR NEGATIVE NEGATIVE Final  Body fluid culture     Status: None (Preliminary result)   Collection Time: 05/10/14  2:01 PM  Result Value Ref Range Status   Specimen Description PLEURAL  Final   Special Requests NONE  Final   Gram Stain   Final    FEW WBC PRESENT,BOTH PMN AND MONONUCLEAR NO ORGANISMS SEEN    Culture   Final    NO GROWTH 1 DAY Performed at [facility]    Report Status PENDING  Incomplete     Scheduled Meds: . acetaminophen  500 mg Oral TID WC & HS  . ciprofloxacin  500 mg Oral BID  . diltiazem  30 mg Oral 4 times per day  . enoxaparin (LOVENOX) injection  1.5 mg/kg Subcutaneous Q24H  . latanoprost  1 drop Both Eyes QHS  . lip balm  1 application Topical BID  . metroNIDAZOLE  500 mg Oral 3 times per day  . pantoprazole  40 mg Oral BID AC  . primidone  50 mg Oral QHS  . psyllium  1 packet Oral Daily  . saccharomyces boulardii  250 mg Oral BID  . sodium chloride  3 mL Intravenous Q12H  . sucralfate  1 g Oral TID WC & HS  . tamsulosin  0.4 mg Oral Daily    Continuous Infusions:

## 2014-05-18 NOTE — Progress Notes (Signed)
Bladder scan volume 509 ml. HR 132. BP 102/63. Rechecked HR one hour later HR128. Notified MD. Will continue to monitor.

## 2014-05-18 NOTE — Progress Notes (Signed)
[facility]  [address] [place], [place] [address] Phone: [phone] FAX: [phone]    [name] [id number] 14-Sep-1945  CARE TEAM:  PCP: Mauricio Po, [place]  Outpatient Care Team: Patient Care Team: Golden Circle, FNP as PCP - General (Family Medicine) Carol Ada, MD as Consulting Physician (Gastroenterology) Truitt Merle, MD as Consulting Physician (Hematology) [name], MD as Consulting Physician (General Surgery)  Inpatient Treatment Team: Treatment Team: Attending Provider: Theodis Blaze, MD; Rounding Team: Redmond Baseman, MD; Registered Nurse: Alfonse Spruce, RN; Consulting Physician: [name], MD; Technician: Lucila Maine, NT; Registered Nurse: Roe Rutherford, RN; Student Nurse: Royston Bake, RN; Registered Nurse: Beryle Quant, RN; Technician: Mertha Baars, NT; Physical Therapist: Diego Cory, PTA; Physical Therapist: Donia Pounds  Problem List:   Principal Problem:   Pleural effusion Active Problems:   Hx pulmonary embolism   HTN (hypertension)   DVT (deep venous thrombosis)   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  Loculated left pleural effusion and chest pain s/p drainage & Abx  Plan: Continue antibiotics and drainage. - cultures from pleural effusion negative.  3-6 weeks Abx total most likely. Repeat chest CT today.  Consult thoracic surgery to evaluate recurrent loculated effusion.  Continue diet as tolerated. Nutrition key - check nutrition labs.  Suspect much related to mod/severe malnutrition.  -Continue foley per Urology w outpt f/u with Dr  Junious Silk  Bowel regimen w probiotics.  -VTE prophylaxis- SCDs, etc  -Mobilize as tolerated to help recovery.    Subjective:  No acute distress. Passing gas and having BM. Pain controlled with meds. Able to ambulate with walker. Vitals stable except tachycardia, no fever or tachypnea. Minimal, green-tinged fluid from drains.  Objective:  Vital signs:  Filed Vitals:   05/17/14 1347 05/17/14 1745 05/17/14 2204 05/18/14 0520  BP: 122/56 124/70 121/58 119/61  Pulse: 136  132 119  Temp: 98.4 F (36.9 C)  98.5 F (36.9 C) 98 F (36.7 C)  TempSrc: Oral  Oral Oral  Resp: 20  22 20   Height:      Weight:    189 lb 6.4 oz (85.911 kg)  SpO2: 97%  98% 97%    Last BM Date: 05/17/14  Intake/Output   Yesterday:  04/18 0701 - 04/19 0700 In: 723 [P.O.:720; I.V.:3] Out: 416 [Urine:400; Drains:15; Stool:1]  Bowel function:  Flatus: y  BM: y  Drain: scant w green tinge  Physical Exam:  General: Pt awake/alert/oriented x4 in no acute distress Eyes: Sclera clear.  No icterus Psych:  No delerium/psychosis/paranoia.Talkative. HENT: Normocephalic. CV: Tachycardic. Regular rhythm. Resp: Breathing non-labored. Abdomen: Soft.  Nondistended. Nontender.  No evidence of peritonitis.  No incarcerated hernias. Ext:  SCDs BLE.  No major edema.  No cyanosis Skin: No petechiae / purpura  Results:   Labs: Results for orders placed or performed during the hospital encounter of 05/09/14 (from the past 48 hour(s))  CBC     Status: Abnormal   Collection Time: 05/17/14  4:17 AM  Result Value Ref Range   WBC 7.9 4.0 - 10.5 K/uL   RBC 2.95 (L) 3.87 - 5.11 MIL/uL  Hemoglobin 8.7 (L) 12.0 - 15.0 g/dL   HCT 27.5 (L) 36.0 - 46.0 %   MCV 93.2 78.0 - 100.0 fL   MCH 29.5 26.0 - 34.0 pg   MCHC 31.6 30.0 - 36.0 g/dL   RDW 15.9 (H) 11.5 - 15.5 %   Platelets 420 (H) 150 - 400 K/uL  Basic metabolic panel     Status: Abnormal   Collection Time: 05/17/14  4:17 AM  Result Value Ref Range   Sodium 133  (L) 135 - 145 mmol/L   Potassium 3.0 (L) 3.5 - 5.1 mmol/L   Chloride 99 96 - 112 mmol/L   CO2 24 19 - 32 mmol/L   Glucose, Bld 94 70 - 99 mg/dL   BUN 6 6 - 23 mg/dL   Creatinine, Ser 0.79 0.50 - 1.10 mg/dL   Calcium 8.3 (L) 8.4 - 10.5 mg/dL   GFR calc non Af Amer 84 (L) >90 mL/min   GFR calc Af Amer >90 >90 mL/min    Comment: (NOTE) The eGFR has been calculated using the CKD EPI equation. This calculation has not been validated in all clinical situations. eGFR's persistently <90 mL/min signify possible Chronic Kidney Disease.    Anion gap 10 5 - 15  Magnesium     Status: None   Collection Time: 05/17/14  4:17 AM  Result Value Ref Range   Magnesium 1.8 1.5 - 2.5 mg/dL  CBC     Status: Abnormal   Collection Time: 05/18/14  5:32 AM  Result Value Ref Range   WBC 11.2 (H) 4.0 - 10.5 K/uL   RBC 3.26 (L) 3.87 - 5.11 MIL/uL   Hemoglobin 9.4 (L) 12.0 - 15.0 g/dL   HCT 30.6 (L) 36.0 - 46.0 %   MCV 93.9 78.0 - 100.0 fL   MCH 28.8 26.0 - 34.0 pg   MCHC 30.7 30.0 - 36.0 g/dL   RDW 16.3 (H) 11.5 - 15.5 %   Platelets 536 (H) 150 - 400 K/uL  Basic metabolic panel     Status: Abnormal   Collection Time: 05/18/14  5:32 AM  Result Value Ref Range   Sodium 135 135 - 145 mmol/L   Potassium 4.3 3.5 - 5.1 mmol/L    Comment: DELTA CHECK NOTED REPEATED TO VERIFY NO VISIBLE HEMOLYSIS    Chloride 104 96 - 112 mmol/L   CO2 23 19 - 32 mmol/L   Glucose, Bld 113 (H) 70 - 99 mg/dL   BUN 8 6 - 23 mg/dL   Creatinine, Ser 0.89 0.50 - 1.10 mg/dL   Calcium 8.7 8.4 - 10.5 mg/dL   GFR calc non Af Amer 66 (L) >90 mL/min   GFR calc Af Amer 76 (L) >90 mL/min    Comment: (NOTE) The eGFR has been calculated using the CKD EPI equation. This calculation has not been validated in all clinical situations. eGFR's persistently <90 mL/min signify possible Chronic Kidney Disease.    Anion gap 8 5 - 15  Magnesium     Status: None   Collection Time: 05/18/14  5:32 AM  Result Value Ref Range   Magnesium 1.7 1.5  - 2.5 mg/dL    Imaging / Studies: Ir Sinus/fist Tube Chk-non Gi  05/17/2014   CLINICAL DATA:  Pancreatic carcinoma, post distal pancreatectomy and splenectomy with persistent leak. Surgical drain remains in place into pigtail catheter had been placed into a retroperitoneal collection ; output has tapered off.  EXAM: RETROPERITONEAL DRAIN CATHETER INJECTION UNDER FLUOROSCOPY  TECHNIQUE: The procedure,  risks (including but not limited to bleeding, infection, organ damage ), benefits, and alternatives were explained to the patient. Questions regarding the procedure were encouraged and answered. The patient understands and consents to the procedure.  Survey fluoroscopic inspection reveals stable position of pigtail catheter in the left upper abdomen and surgical drain to the subphrenic space. A small left pleural effusion is noted.  Injection of the left upper quadrant percutaneous pigtail drain catheter demonstrates filling of a small irregular cavity around the drain catheter. With continued injection, contrast tracks back along the catheter and exits the skin entry site. There is no opacification of the pancreatic duct or biliary tree. With aspiration, most of the injected contrast can be removed.  After telephone consultation with Dr. Barry Dienes, the decision was made to proceed with injection of the left lateral surgical drain catheter. However, the catheter is occluded such that injection using a moderately vigorous pressure did not result in any contrast entering the drain catheter. Inspection demonstrates that the retention sutures is not occlusive. High pressure injection was deferred. Both catheters were returned to bulb drainage.  The patient tolerated the procedure well. No immediate complication.  IMPRESSION: 1. Small residual cavity around the pigtail drain catheter, with NO communication to the pancreatic duct or biliary tree opacified. 2. Occlusion of surgical drain catheter.   Electronically Signed    By: Lucrezia Europe M.D.   On: 05/17/2014 15:03    Medications / Allergies: per chart  Antibiotics: Anti-infectives    Start     Dose/Rate Route Frequency Ordered Stop   05/16/14 2000  ciprofloxacin (CIPRO) tablet 500 mg     500 mg Oral 2 times daily 05/16/14 1624     05/16/14 1700  metroNIDAZOLE (FLAGYL) tablet 500 mg     500 mg Oral 3 times per day 05/16/14 1607     05/10/14 2000  ciprofloxacin (CIPRO) IVPB 400 mg  Status:  Discontinued     400 mg 200 mL/hr over 60 Minutes Intravenous Every 12 hours 05/10/14 1758 05/16/14 1624   05/10/14 1800  piperacillin-tazobactam (ZOSYN) IVPB 3.375 g  Status:  Discontinued     3.375 g 12.5 mL/hr over 240 Minutes Intravenous Every 8 hours 05/10/14 1743 05/16/14 1607   05/10/14 1100  doxycycline (VIBRA-TABS) tablet 100 mg  Status:  Discontinued     100 mg Oral 2 times daily 05/10/14 1008 05/11/14 2251       [name], PA-S [facility] [phone] 05/18/2014  10:09 AM  Seen, agree with above.   Pt examined in conjunction with PA-S and plan formulated together.  Leave drains for now.  Surgical drain continues to drain 2-6 mL of foul fluid per day.    CT chest w contrast.  Thoracic consult.

## 2014-05-18 NOTE — Progress Notes (Signed)
Patient ID: [name], female   DOB: [birth year], 69 y.o.   MRN: [id number]    Referring Physician(s): CCS  Subjective:  Pt doing fair; still with left lateral chest discomfort with deep insp, occ nausea; affect a little depressed today; cont to be tachycardic but denies substernal pain  Allergies: Sulfa antibiotics  Medications: Prior to Admission medications   Medication Sig Start Date End Date Taking? Authorizing Provider  atorvastatin (LIPITOR) 40 MG tablet Take 1 tablet (40 mg total) by mouth daily. 05/05/14  Yes Gerlene Fee, NP  diltiazem (CARTIA XT) 120 MG 24 hr capsule Take 1 capsule (120 mg total) by mouth daily. 05/05/14  Yes Gerlene Fee, NP  DOXYCYCLINE HYCLATE PO Take 100 mg by mouth 2 (two) times daily.   Yes Historical Provider, MD  enoxaparin (LOVENOX) 120 MG/0.8ML injection Inject 0.8 mLs (120 mg total) into the skin daily. 05/06/14  Yes Truitt Merle, MD  latanoprost (XALATAN) 0.005 % ophthalmic solution Place 1 drop into both eyes at bedtime.   Yes Historical Provider, MD  pantoprazole (PROTONIX) 40 MG tablet TAKE 1 TABLET BY MOUTH DAILY 03/28/14  Yes Leandrew Koyanagi, MD  primidone (MYSOLINE) 50 MG tablet Take 50 mg by mouth at bedtime. Patient takes at nite   Yes Historical Provider, MD  propranolol ER (INDERAL LA) 120 MG 24 hr capsule TAKE ONE CAPSULE BY MOUTH DAILY 03/28/14  Yes Mancel Bale, PA-C  sucralfate (CARAFATE) 1 GM/10ML suspension Take 10 mLs (1 g total) by mouth 4 (four) times daily -  with meals and at bedtime. 05/04/14  Yes [name], MD  tamsulosin (FLOMAX) 0.4 MG CAPS capsule Take 1 capsule (0.4 mg total) by mouth daily. 05/04/14  Yes [name], MD  traMADol (ULTRAM) 50 MG tablet Take 1 tablet (50 mg total) by mouth every 6 (six) hours as needed. 05/04/14  Yes [name], MD  loperamide (IMODIUM) 2 MG capsule Take 1 capsule (2 mg total) by mouth 3 (three) times daily before meals. 05/04/14   [name], MD     Vital Signs: BP 102/63 mmHg   Pulse 132  Temp(Src) 98 F (36.7 C) (Oral)  Resp 20  Ht 5' 9.5" (1.765 m)  Wt 189 lb 6.4 oz (85.911 kg)  BMI 27.58 kg/m2  SpO2 100%  Physical Exam LUQ drain intact,output 10 cc's, insertion site ok, mildly tender  Imaging: Dg Chest 1 View  05/14/2014   CLINICAL DATA:  Post attempted left-sided thoracentesis  EXAM: CHEST  1 VIEW  COMPARISON:  Earlier same day ; 05/13/2014; 05/10/2014; CT abdomen pelvis - 05/11/2014  FINDINGS: Grossly unchanged borderline enlarged cardiac silhouette and mediastinal contours with persistent obscuration of left heart border secondary to grossly unchanged small loculated left-sided effusion with associated left basilar consolidative opacities. No new focal airspace opacities. No pneumothorax. No evidence of edema. No acute osseus abnormalities. Percutaneous drainage catheter overlies the left upper abdominal quadrant.  IMPRESSION: 1. Unchanged small partially loculated left-sided effusion post attempted thoracentesis. No pneumothorax. 2. Grossly unchanged consolidative opacities within the left lower lung.   Electronically Signed   By: Sandi Mariscal M.D.   On: 05/14/2014 15:45   Ct Chest W Contrast  05/18/2014   CLINICAL DATA:  Loculated left pleural effusion, chest pain.  EXAM: CT CHEST WITH CONTRAST  TECHNIQUE: Multidetector CT imaging of the chest was performed during intravenous contrast administration.  CONTRAST:  3mL OMNIPAQUE IOHEXOL 300 MG/ML  SOLN  COMPARISON:  CT scan of May 09, 2014.  FINDINGS: No pneumothorax is noted. Mild to moderate left pleural effusion is noted with associated atelectasis of the left lower lobe. The superior portion of this effusion appears to be loculated. There is no evidence of thoracic aortic dissection or aneurysm. Visualized portions of pulmonary arteries appear normal. No mediastinal mass or adenopathy is noted no significant osseous abnormality is noted. Percutaneous drain is again noted between the stomach and left kidney.   IMPRESSION: Mild to moderate left pleural effusion is noted with adjacent atelectasis of the left lower lobe. The smaller superior portion of this effusion appears to be loculated. This appears to be smaller compared to prior exam.   Electronically Signed   By: Marijo Conception, M.D.   On: 05/18/2014 12:13   Ir Sinus/fist Tube Chk-non Gi  05/17/2014   CLINICAL DATA:  Pancreatic carcinoma, post distal pancreatectomy and splenectomy with persistent leak. Surgical drain remains in place into pigtail catheter had been placed into a retroperitoneal collection ; output has tapered off.  EXAM: RETROPERITONEAL DRAIN CATHETER INJECTION UNDER FLUOROSCOPY  TECHNIQUE: The procedure, risks (including but not limited to bleeding, infection, organ damage ), benefits, and alternatives were explained to the patient. Questions regarding the procedure were encouraged and answered. The patient understands and consents to the procedure.  Survey fluoroscopic inspection reveals stable position of pigtail catheter in the left upper abdomen and surgical drain to the subphrenic space. A small left pleural effusion is noted.  Injection of the left upper quadrant percutaneous pigtail drain catheter demonstrates filling of a small irregular cavity around the drain catheter. With continued injection, contrast tracks back along the catheter and exits the skin entry site. There is no opacification of the pancreatic duct or biliary tree. With aspiration, most of the injected contrast can be removed.  After telephone consultation with Dr. Barry Dienes, the decision was made to proceed with injection of the left lateral surgical drain catheter. However, the catheter is occluded such that injection using a moderately vigorous pressure did not result in any contrast entering the drain catheter. Inspection demonstrates that the retention sutures is not occlusive. High pressure injection was deferred. Both catheters were returned to bulb drainage.  The  patient tolerated the procedure well. No immediate complication.  IMPRESSION: 1. Small residual cavity around the pigtail drain catheter, with NO communication to the pancreatic duct or biliary tree opacified. 2. Occlusion of surgical drain catheter.   Electronically Signed   By: Lucrezia Europe M.D.   On: 05/17/2014 15:03   US Thoracentesis Asp Pleural Space W/img Guide  05/14/2014   INDICATION: Symptomatic left sided pleural effusion  EXAM: US THORACENTESIS ASP PLEURAL SPACE W/IMG GUIDE  COMPARISON:  Chest radiograph - earlier same day; 05/13/2014; 05/10/2014; CT abdomen pelvis - 05/11/2014  MEDICATIONS: None  COMPLICATIONS: None immediate  TECHNIQUE: Informed written consent was obtained from the patient after a discussion of the risks, benefits and alternatives to treatment. A timeout was performed prior to the initiation of the procedure.  Initial ultrasound scanning demonstrates a small pleural effusion with multiple internal echogenic septations and apparent tethering of the left lower lobe (representative images 1 through 4). The lower chest was prepped and draped in the usual sterile fashion. 1% lidocaine was used for local anesthesia.  A 5 [place] Yueh sheath needle was advanced into an anechoic component left-sided pleural effusion however only yielded approximately 2 cc of fluid. Yueh sheath needle was then advanced into a separate anechoic component of the left-sided pleural effusion however only approximately 8  cc of fluid was able to be aspirated. Multiple ultrasound images were saved for documentation purposes.  The catheter was removed and a dressing was applied. The patient tolerated the procedure well without immediate post procedural complication. The patient was escorted to have an upright chest radiograph.  FINDINGS: A total of approximately 10 cc of blood tinged serous fluid was removed.  IMPRESSION: Successful ultrasound-guided left sided thoracentesis yielding only 10 cc liters of pleural fluid  secondary to marked loculation of the effusion.   Electronically Signed   By: Sandi Mariscal M.D.   On: 05/14/2014 16:06    Labs:  CBC:  Recent Labs  05/14/14 0512 05/16/14 0422 05/17/14 0417 05/18/14 0532  WBC 7.3 9.3 7.9 11.2*  HGB 8.8* 8.7* 8.7* 9.4*  HCT 28.7* 28.3* 27.5* 30.6*  PLT 431* 410* 420* 536*    COAGS:  Recent Labs  04/01/14 1400 04/08/14 0630 04/09/14 0404 05/09/14 0655  INR 1.11 1.06 1.12 1.08  APTT  --  40* 33  --     BMP:  Recent Labs  05/14/14 0512 05/16/14 0422 05/17/14 0417 05/18/14 0532  NA 137 135 133* 135  K 3.2* 3.2* 3.0* 4.3  CL 103 101 99 104  CO2 24 26 24 23   GLUCOSE 104* 107* 94 113*  BUN 8 8 6 8   CALCIUM 8.4 8.4 8.3* 8.7  CREATININE 0.99 1.05 0.79 0.89  GFRNONAA 58* 54* 84* 66*  GFRAA 67* 62* >90 76*    LIVER FUNCTION TESTS:  Recent Labs  04/24/14 2039  04/30/14 0450  05/03/14 0440 05/04/14 0415 05/09/14 0655 05/10/14 0350 05/16/14 0422  BILITOT 0.5  --  0.3  --   --   --  0.5  --  0.3  AST 30  --  29  --   --   --  38*  --  36  ALT 13  --  10  --   --   --  17  --  16  ALKPHOS 83  --  65  --   --   --  78  --  88  PROT 6.1  --  6.0  --   --   --  7.6 6.7 6.8  ALBUMIN 2.4*  < > 3.0*  < > 3.1* 3.2* 3.8  --  2.8*  < > = values in this interval not displayed.  Assessment and Plan: S/p LUQ abscess drainage 3/18 (post distal pancreatectomy/splenectomy/partial colectomy 04/08/14 for panc ca); afebrile; WBC 11.2 (7.9); hgb 9.4; left pleural fl cx's neg; drain injection 4/18 showed no comm with panc duct or biliary tree; CT chest today shows mild to mod loculated left effusion/LLL atx, smaller than on previous exam. Per CCS drain to remain in place for now; await thoracic surg consult. ?place back on telemetry   Signed: Jaaron Oleson,D [facility] 05/18/2014, 3:25 PM   I spent a total of 15 minutes in face to face in clinical consultation/evaluation, greater than 50% of which was counseling/coordinating care for LUQ abscess  drain

## 2014-05-19 DIAGNOSIS — D638 Anemia in other chronic diseases classified elsewhere: Secondary | ICD-10-CM

## 2014-05-19 DIAGNOSIS — I471 Supraventricular tachycardia: Secondary | ICD-10-CM

## 2014-05-19 DIAGNOSIS — J948 Other specified pleural conditions: Secondary | ICD-10-CM

## 2014-05-19 DIAGNOSIS — L0291 Cutaneous abscess, unspecified: Secondary | ICD-10-CM

## 2014-05-19 DIAGNOSIS — J90 Pleural effusion, not elsewhere classified: Secondary | ICD-10-CM

## 2014-05-19 LAB — BASIC METABOLIC PANEL
Anion gap: 8 (ref 5–15)
BUN: 8 mg/dL (ref 6–23)
CO2: 23 mmol/L (ref 19–32)
CREATININE: 0.85 mg/dL (ref 0.50–1.10)
Calcium: 8.4 mg/dL (ref 8.4–10.5)
Chloride: 107 mmol/L (ref 96–112)
GFR calc Af Amer: 80 mL/min — ABNORMAL LOW (ref >90)
GFR calc non Af Amer: 69 mL/min — ABNORMAL LOW (ref >90)
Glucose, Bld: 91 mg/dL (ref 70–99)
POTASSIUM: 3.7 mmol/L (ref 3.5–5.1)
Sodium: 138 mmol/L (ref 135–145)

## 2014-05-19 LAB — TSH: TSH: 1.271 u[IU]/mL (ref 0.350–4.500)

## 2014-05-19 LAB — CBC
HEMATOCRIT: 28.3 % — AB (ref 36.0–46.0)
HEMOGLOBIN: 8.7 g/dL — AB (ref 12.0–15.0)
MCH: 29 pg (ref 26.0–34.0)
MCHC: 30.7 g/dL (ref 30.0–36.0)
MCV: 94.3 fL (ref 78.0–100.0)
Platelets: 528 10*3/uL — ABNORMAL HIGH (ref 150–400)
RBC: 3 MIL/uL — ABNORMAL LOW (ref 3.87–5.11)
RDW: 16.6 % — ABNORMAL HIGH (ref 11.5–15.5)
WBC: 9.6 10*3/uL (ref 4.0–10.5)

## 2014-05-19 LAB — MAGNESIUM: Magnesium: 1.6 mg/dL (ref 1.5–2.5)

## 2014-05-19 NOTE — Progress Notes (Signed)
NUTRITION FOLLOW-UP   INTERVENTION: - Continue current diet order  - Will d/c Magic Cup as pt does not want supplements at this time   NUTRITION DIAGNOSIS: Increased nutrient (protein) needs related to biological demands, medical course as evidenced by pancreatic cancer with surgical intervention 1 month ago. -ongoing  Goal: Pt to meet >/=90% estimated needs with diet re-advancement -variably met  Monitor:  Iintakes of meals, weight trends, labs, I/O's   69 y.o. female  Admitting Dx: Pleural effusion  ASSESSMENT: 69 y.o. female with hx of pancreatic cancer who underweight partial pancreatectomy, splenectomy, and partial colectomy 04/08/14. Pt was having vomiting for 24 hours PTA.  Pt eating 50-100% mainly since last assessment. She reports appetite is "so-so" and that she has been having ongoing nausea. She reports that her last episode of emesis was last night after dinner. Talked with pt about Magic Cup supplement and she states she is not interested in receiving it and that she tries to limit sugar in her diet.   Pt meeting needs on average. Labs and medications reviewed.  Height: Ht Readings from Last 1 Encounters:  05/12/14 5' 9.5" (1.765 m)    Weight: Wt Readings from Last 1 Encounters:  05/18/14 189 lb 6.4 oz (85.911 kg)     Wt Readings from Last 10 Encounters:  05/18/14 189 lb 6.4 oz (85.911 kg)  05/06/14 201 lb (91.173 kg)  05/05/14 204 lb 12 oz (92.874 kg)  05/04/14 204 lb 12.9 oz (92.9 kg)  04/01/14 202 lb (91.627 kg)  03/22/14 210 lb 6.4 oz (95.437 kg)  03/19/14 212 lb 12.8 oz (96.525 kg)  02/26/14 216 lb (97.977 kg)  02/16/14 216 lb 12.8 oz (98.34 kg)  01/04/14 218 lb (98.884 kg)     BMI:  Body mass index is 27.58 kg/(m^2).  Estimated Nutritional Needs: Kcal: 1700-2000  Protein: 130-150 grams given cancer and recent surgery Fluid: >2L/day related to ongoing emesis  Skin: WDL  Diet Order: Diet regular Room service appropriate?: Yes; Fluid  consistency:: Thin    Intake/Output Summary (Last 24 hours) at 05/19/14 1216 Last data filed at 05/19/14 0641  Gross per 24 hour  Intake    120 ml  Output    693 ml  Net   -573 ml    Last BM: 4/19  Labs:   Recent Labs Lab 05/17/14 0417 05/18/14 0532 05/19/14 0434  NA 133* 135 138  K 3.0* 4.3 3.7  CL 99 104 107  CO2 24 23 23   BUN 6 8 8   CREATININE 0.79 0.89 0.85  CALCIUM 8.3* 8.7 8.4  MG 1.8 1.7 1.6  GLUCOSE 94 113* 91    CBG (last 3)  No results for input(s): GLUCAP in the last 72 hours.  Scheduled Meds: . acetaminophen  500 mg Oral TID WC & HS  . ciprofloxacin  500 mg Oral BID  . diltiazem  30 mg Oral 4 times per day  . enoxaparin (LOVENOX) injection  1.5 mg/kg Subcutaneous Q24H  . latanoprost  1 drop Both Eyes QHS  . lip balm  1 application Topical BID  . metroNIDAZOLE  500 mg Oral 3 times per day  . pantoprazole  40 mg Oral BID AC  . primidone  50 mg Oral QHS  . psyllium  1 packet Oral Daily  . saccharomyces boulardii  250 mg Oral BID  . sodium chloride  3 mL Intravenous Q12H  . sucralfate  1 g Oral TID WC & HS  . tamsulosin  0.4 mg  Oral Daily    Continuous Infusions:    Past Medical History  Diagnosis Date  . Glaucoma   . Clotting disorder   . DVT (deep venous thrombosis)     LEFT  . GERD (gastroesophageal reflux disease)   . Hypercholesterolemia   . Peripheral neuropathy   . Essential tremor   . Diverticulosis   . Arthritis     knees and back  . Cancer     Pancreatic  . Hypertension   . Colon polyps   . UTI (lower urinary tract infection)   . Complication of anesthesia     slow to wak up   . Pulmonary embolism     Past Surgical History  Procedure Laterality Date  . Cholecystectomy    . Abdominal hysterectomy    . Knee surgery Bilateral   . Greenfield filter    . Back surgery    . Eus N/A 02/26/2014    Procedure: UPPER ENDOSCOPIC ULTRASOUND (EUS) LINEAR;  Surgeon: Beryle Beams, MD;  Location: [facility];  Service:  Endoscopy;  Laterality: N/A;  . Fine needle aspiration N/A 02/26/2014    Procedure: FINE NEEDLE ASPIRATION (FNA) LINEAR;  Surgeon: Beryle Beams, MD;  Location: [facility];  Service: Endoscopy;  Laterality: N/A;  . Breast lumpectomy Right   . Spine surgery    . Xi robotic assisted laparoscopic distal pancreatectomy N/A 04/08/2014    Procedure: XI ROBOTIC ASSISTED CONVERTED LAPAROSCOPIC HAND ASSITED DISTAL PANCREATECTOMY/SPLENECTOMY WITH PARTIAL COLECTOMY;  Surgeon: [name], MD;  Location: [facility];  Service: General;  Laterality: N/A;  . Colon surgery    . Diagnostic laparoscopy      Jarome Matin, RD, LDN Inpatient Clinical Dietitian Pager # [phone] After hours/weekend pager # [phone]

## 2014-05-19 NOTE — Progress Notes (Signed)
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  Sitting up eating breakfast.  Her rhythm is sinus tachycardia at 110/m.  She is on diltiazem.  At home she was also taking propranolol. We will check an echocardiogram to assess her left ventricular function  CURRENT MEDS . acetaminophen  500 mg Oral TID WC & HS  . ciprofloxacin  500 mg Oral BID  . diltiazem  30 mg Oral 4 times per day  . enoxaparin (LOVENOX) injection  1.5 mg/kg Subcutaneous Q24H  . latanoprost  1 drop Both Eyes QHS  . lip balm  1 application Topical BID  . metroNIDAZOLE  500 mg Oral 3 times per day  . pantoprazole  40 mg Oral BID AC  . primidone  50 mg Oral QHS  . psyllium  1 packet Oral Daily  . saccharomyces boulardii  250 mg Oral BID  . sodium chloride  3 mL Intravenous Q12H  . sucralfate  1 g Oral TID WC & HS  . tamsulosin  0.4 mg Oral Daily    OBJECTIVE  Filed Vitals:   05/18/14 1335 05/18/14 1448 05/18/14 2115 05/19/14 0638  BP: 111/72 102/63 117/64 113/58  Pulse:  132 123 103  Temp:  98 F (36.7 C) 98.4 F (36.9 C) 98.1 F (36.7 C)  TempSrc:  Oral Oral Oral  Resp:  20 16 20   Height:      Weight:      SpO2:  100% 97% 99%    Intake/Output Summary (Last 24 hours) at 05/19/14 0827 Last data filed at 05/19/14 0641  Gross per 24 hour  Intake    120 ml  Output    693 ml  Net   -573 ml   Filed Weights   05/16/14 0705 05/17/14 0629 05/18/14 0520  Weight: 189 lb 3.2 oz (85.821 kg) 187 lb 13.3 oz (85.2 kg) 189 lb 6.4 oz (85.911 kg)    PHYSICAL EXAM  General: Pleasant,  NAD. Neuro: Alert and oriented X 3. Moves all extremities spontaneously. Psych: Normal affect. HEENT:  Normal  Neck: Supple without bruits or JVD. Lungs:  Resp regular and unlabored, decreased breath sounds at bases. Heart: RRR no s3, s4, or murmurs. Abdomen: Soft, non-tender, non-distended, BS + x 4.  Extremities: No clubbing, cyanosis or edema. DP/PT/Radials 2+ and equal bilaterally.  Accessory Clinical Findings  CBC  Recent Labs  05/18/14 0532 05/19/14 0434  WBC 11.2* 9.6  HGB 9.4* 8.7*  HCT 30.6* 28.3*  MCV 93.9 94.3  PLT 536* 443*   Basic Metabolic Panel  Recent Labs  05/18/14 0532 05/19/14 0434  NA 135 138  K 4.3 3.7  CL 104 107  CO2 23 23  GLUCOSE 113* 91  BUN 8 8  CREATININE 0.89 0.85  CALCIUM 8.7 8.4  MG 1.7 1.6   Liver Function Tests No results for input(s): AST, ALT, ALKPHOS, BILITOT, PROT, ALBUMIN in the last 72 hours. No results for input(s): LIPASE, AMYLASE in the last 72 hours. Cardiac Enzymes No results for input(s): CKTOTAL, CKMB, CKMBINDEX, TROPONINI in  the last 72 hours. BNP Invalid input(s): POCBNP D-Dimer No results for input(s): DDIMER in the last 72 hours. Hemoglobin A1C No results for input(s): HGBA1C in the last 72 hours. Fasting Lipid Panel No results for input(s): CHOL, HDL, LDLCALC, TRIG, CHOLHDL, LDLDIRECT in the last 72 hours. Thyroid Function Tests  Recent Labs  05/19/14 0434  TSH 1.271    TELE  Sinus tachycardia  ECG  Sinus tachycardia  Radiology/Studies  Ct Abdomen Pelvis Wo Contrast  05/11/2014   CLINICAL DATA:  Evaluate for fluid collection. Pancreatic cancer. Previous distal pancreatectomy, splenectomy and partial colectomy.  EXAM: CT ABDOMEN AND PELVIS WITHOUT CONTRAST  TECHNIQUE: Multidetector CT imaging of the abdomen and pelvis was performed following the standard protocol without IV contrast.  COMPARISON:  04/29/2014  FINDINGS: Lung bases demonstrate a tiny amount of right pleural fluid and associated  dependent atelectasis. There is a moderate size left pleural fluid collection with associated consolidation likely compressive atelectasis. Mild stable cardiomegaly.  Abdominal images demonstrate no change in positioning of the two left upper quadrant surgical drains. There is slightly less fluid adjacent the pigtail portion of the catheter. There is a slight decrease in size of a fluid collection over the surgical suture line overlying the distal pancreas measuring 2.1 x 2.7 cm (previously 2.7 x 3 cm) with no change in a small focus of air along the anterior aspect of the fluid collection.  Previous cholecystectomy. The liver, adrenal glands and kidneys are otherwise unchanged. IVC filter unchanged. Postsurgical change over the anterior abdominal wall in the midline over the epigastric region.  Postsurgical change over the left colon as the anastomotic site is patent. There remains mild stranding of the adjacent mesenteric fat adjacent the anastomotic site. The appendix is normal. There is moderate diverticulosis throughout the colon.  Pelvic images demonstrate a Foley catheter within a decompressed bladder. Rectosigmoid colon is normal. There is a small amount of free fluid slightly improved. Degenerative changes of the spine and hips.  IMPRESSION: Two left upper quadrant surgical drains remain in place and unchanged in position. No significant fluid collection within the left upper quadrant.  Slight interval decrease in size of a fluid collection over the surgical bed adjacent the distal pancreatectomy measuring 2.1 x 2.7 cm (previously 2.7 x 3 cm).  Interval improvement small amount of free pelvic fluid.  Stable stranding of the mesenteric fat adjacent the colonic anastomosis in the left mid abdomen likely postsurgical change.  Other stable chronic changes within the abdomen.  Continued moderate left pleural effusion with associated compressive atelectasis. Tiny right pleural fluid collection with associated  atelectasis.   Electronically Signed   By: Marin Olp M.D.   On: 05/11/2014 17:02   Ct Abdomen Pelvis Wo Contrast  04/29/2014   CLINICAL DATA:  Intermittent nausea and vomiting with poor appetite and left upper quadrant soreness. History of distal pancreatectomy/ splenectomy and partial colectomy. Subsequent encounter.  EXAM: CT ABDOMEN AND PELVIS WITHOUT CONTRAST  TECHNIQUE: Multidetector CT imaging of the abdomen and pelvis was performed following the standard protocol without IV contrast.  COMPARISON:  04/24/2014.  MR abdomen 01/05/2014.  FINDINGS: Lower chest: Lung bases show a moderate simple appearing left pleural effusion with likely compressive atelectasis in the left lower lobe. Heart size within normal limits. Decreased attenuation of the intravascular compartment is indicative of anemia. No pericardial effusion.  Hepatobiliary: A lobulated low-attenuation lesion in the inferior right hepatic lobe measures 2.5 cm, stable, characterized as a cyst on 01/05/2014. Liver is otherwise  unremarkable. Cholecystectomy. Extrahepatic bile duct measures 1.4 cm, stable.  Pancreas: Status post distal pancreatectomy with a focal low-attenuation lesion at the surgical margin, measuring 2.7 x 3.0 cm, grossly stable in size. Pancreas is otherwise unremarkable.  Spleen: Surgically absent.  Adrenals/Urinary Tract: Adrenal glands and right kidney are unremarkable. Low-attenuation lesion in the interpolar left kidney measures 1.5 cm, stable and characterized as a cyst on 01/05/2014. Ureters are decompressed. Air in the bladder is presumably iatrogenic. Bladder wall may be slightly thickened.  Stomach/Bowel: Stomach, small bowel and proximal colon are unremarkable. An anastomosis is seen at the splenic flexure. Colon is otherwise unremarkable.  Vascular/Lymphatic: Atherosclerotic calcification of the arterial vasculature without abdominal aortic aneurysm. No pathologically enlarged lymph nodes. IVC filter is in place.   Reproductive: Hysterectomy.  Ovaries are visualized.  Other: Small pelvic free fluid. 2 percutaneous drains terminate in the left upper quadrant, along the posterior margin of the stomach and anterior margin of the left kidney. No definite associated fluid collection. Upper abdominal midline ventral hernia contains fat, small in size. Presumed subcutaneous injection densities and air in the ventral low abdomen and pelvis. Mesenteries and peritoneum are otherwise unremarkable.  Musculoskeletal: No worrisome lytic or sclerotic lesions.  IMPRESSION: 1. Postoperative changes of distal pancreatectomy with stable pseudocyst or seroma adjacent to the surgical margin. 2. Resolved left upper quadrant abscess with 2 percutaneous drains in place. 3. Small pelvic free fluid. 4. Moderate left pleural effusion with likely compressive atelectasis in the left lower lobe. Difficult to definitively exclude pneumonia.   Electronically Signed   By: Lorin Picket M.D.   On: 04/29/2014 15:25   Ct Abdomen Pelvis Wo Contrast  04/24/2014   CLINICAL DATA:  Status post splenectomy and distal pancreatectomy, post drainage of intra-abdominal abscess  EXAM: CT ABDOMEN AND PELVIS WITHOUT CONTRAST  TECHNIQUE: Multidetector CT imaging of the abdomen and pelvis was performed following the standard protocol without IV contrast.  COMPARISON:  04/15/2014 and 04/16/2014  FINDINGS: Sagittal images of the spine shows diffuse osteopenia. Mild degenerative changes lumbar spine. There is moderate left pleural effusion with left lower lobe atelectasis.  Again noted status post cholecystectomy. Unenhanced liver is unremarkable. The patient is status post splenectomy and distal pancreatectomy. A percutaneous drainage catheter is noted in left flank with tip in left upper quadrant anterior to upper pole of the left kidney. There is significant improvement from prior exam. The previous subdiaphragmatic fluid has resolved. The collection anterior to left  kidney has resolved. Stable postsurgical changes at the site of distal pancreatectomy. Small residual seroma at this level measures 2.8 cm stable in size in appearance from prior exam. A cyst in midpole posterior aspect of the left kidney is stable. Postsurgical drain in left lower abdomen is stable. No new low abscess is noted in left abdomen. Only tiny amount of residual fluid/air is noted just anterior to the surgical drain in left upper abdomen axial image 14 measures about 2.4 cm . IVC filter in place again noted. There is no small bowel obstruction. No ascites or free air. Normal appendix partially visualized. Mild anasarca infiltration subcutaneous fat bilateral flank wall. Again noted subcutaneous round density consistent with subcutaneous injections lower anterior abdominal wall.  No nephrolithiasis. No hydronephrosis or hydroureter. No distal colonic obstruction. There is a Foley catheter within a decompressed urinary bladder. Air is noted within anterior aspect of the bladder.  IMPRESSION: 1. The patient is status post splenectomy and distal pancreatectomy. A percutaneous drainage catheter is noted in left flank  with tip in left upper quadrant anterior to upper pole of the left kidney. There is significant improvement from prior exam. The previous subdiaphragmatic fluid has resolved. The collection anterior to left kidney has resolved. 2. There is moderate left pleural effusion with left lower lobe atelectasis. 3. No new abscess collection is noted. Only tiny amount of residual fluid/air is noted just anterior to the surgical drain in left upper abdomen axial image 14 measures about 2.4 cm 4. Stable postsurgical changes at the site of distal pancreatectomy. 5. IVC filter in place again noted. 6. No small bowel or colonic obstruction. 7. There is Foley catheter in decompressed urinary bladder. Moderate air is noted anterior aspect of the bladder probable post instrumentation.   Electronically Signed   By:  Lahoma Crocker M.D.   On: 04/24/2014 18:43   Dg Chest 1 View  05/14/2014   CLINICAL DATA:  Post attempted left-sided thoracentesis  EXAM: CHEST  1 VIEW  COMPARISON:  Earlier same day ; 05/13/2014; 05/10/2014; CT abdomen pelvis - 05/11/2014  FINDINGS: Grossly unchanged borderline enlarged cardiac silhouette and mediastinal contours with persistent obscuration of left heart border secondary to grossly unchanged small loculated left-sided effusion with associated left basilar consolidative opacities. No new focal airspace opacities. No pneumothorax. No evidence of edema. No acute osseus abnormalities. Percutaneous drainage catheter overlies the left upper abdominal quadrant.  IMPRESSION: 1. Unchanged small partially loculated left-sided effusion post attempted thoracentesis. No pneumothorax. 2. Grossly unchanged consolidative opacities within the left lower lung.   Electronically Signed   By: Sandi Mariscal M.D.   On: 05/14/2014 15:45   Dg Chest 1 View  05/10/2014   CLINICAL DATA:  Status post thoracentesis  EXAM: CHEST  1 VIEW  COMPARISON:  Chest radiograph and chest CT May 09, 2014  FINDINGS: No pneumothorax. Left effusion smaller following thoracentesis. There is some residual left effusion with left base atelectasis. There is a chest tube in the left subpleural region as well as a pigtail catheter in the left upper abdomen.  Right lung is clear. Heart is mildly enlarged with pulmonary vascularity, stable.  IMPRESSION: Left effusion smaller following thoracentesis. There is a small left effusion with left base atelectasis. Subpulmonic chest tube remains on the left as does a drain in the upper abdomen on the left. Right lung is clear. No change in cardiac silhouette. No demonstrable pneumothorax.   Electronically Signed   By: Lowella Grip III M.D.   On: 05/10/2014 14:13   Dg Chest 2 View  05/14/2014   CLINICAL DATA:  Followup pleural effusion.  EXAM: CHEST  2 VIEW  COMPARISON:  Chest x-ray 05/13/2014 and CT  abdomen 05/11/2014.  FINDINGS: Persistent small left pleural effusion with overlying atelectasis. The right lung remains clear. Stable left upper quadrant drainage catheter.  IMPRESSION: Persistent left pleural effusion with overlying atelectasis.   Electronically Signed   By: Marijo Sanes M.D.   On: 05/14/2014 14:10   Dg Chest 2 View  05/13/2014   CLINICAL DATA:  Pancreatic cancer, shortness of breath, pleural effusions, weakness  EXAM: CHEST  2 VIEW  COMPARISON:  05/10/2014  FINDINGS: Pigtail drainage catheter LEFT upper quadrant.  Enlargement of cardiac silhouette.  Atherosclerotic calcification aorta.  Mediastinal contours and pulmonary vascularity otherwise normal.  Increased LEFT pleural effusion and basilar atelectasis since previous exam.  Remaining lungs clear.  No pneumothorax.  Bones demineralized.  IMPRESSION: Increased LEFT pleural effusion and basilar atelectasis.  Enlargement of cardiac silhouette.   Electronically Signed  By: Lavonia Dana M.D.   On: 05/13/2014 14:11   Dg Chest 2 View  05/09/2014   CLINICAL DATA:  69 year old female with shortness of breath and chest pain. Initial encounter.  Personal history of distal pancreatectomy and splenectomy.  EXAM: CHEST  2 VIEW  COMPARISON:  Chest and abdominal series 4 /09/2014. CT Abdomen and Pelvis 04/29/2014, portable chest 04/17/2014.  FINDINGS: Semi upright AP and lateral views of the chest. Left pleural effusion has progressed and is moderate to large. Increased opacification at the left lung base. Lower lung volumes.  Stable cardiac size and mediastinal contours. No pneumothorax or pulmonary edema. Grossly clear right lung. Visualized tracheal air column is within normal limits.  Left abdomen percutaneous drains remain in place. Partially visible IVC filter.  IMPRESSION: 1. Progressed left pleural effusion, now moderate to large. Associated decreased ventilation in the left lung. 2. Lower lung volumes.   Electronically Signed   By: Genevie Ann  M.D.   On: 05/09/2014 08:49   Ct Chest W Contrast  05/18/2014   CLINICAL DATA:  Loculated left pleural effusion, chest pain.  EXAM: CT CHEST WITH CONTRAST  TECHNIQUE: Multidetector CT imaging of the chest was performed during intravenous contrast administration.  CONTRAST:  64mL OMNIPAQUE IOHEXOL 300 MG/ML  SOLN  COMPARISON:  CT scan of May 09, 2014.  FINDINGS: No pneumothorax is noted. Mild to moderate left pleural effusion is noted with associated atelectasis of the left lower lobe. The superior portion of this effusion appears to be loculated. There is no evidence of thoracic aortic dissection or aneurysm. Visualized portions of pulmonary arteries appear normal. No mediastinal mass or adenopathy is noted no significant osseous abnormality is noted. Percutaneous drain is again noted between the stomach and left kidney.  IMPRESSION: Mild to moderate left pleural effusion is noted with adjacent atelectasis of the left lower lobe. The smaller superior portion of this effusion appears to be loculated. This appears to be smaller compared to prior exam.   Electronically Signed   By: Marijo Conception, M.D.   On: 05/18/2014 12:13   Ct Angio Chest Pe W/cm &/or Wo Cm  05/09/2014   CLINICAL DATA:  Shortness of breath, chest pain.  EXAM: CT ANGIOGRAPHY CHEST WITH CONTRAST  TECHNIQUE: Multidetector CT imaging of the chest was performed using the standard protocol during bolus administration of intravenous contrast. Multiplanar CT image reconstructions and MIPs were obtained to evaluate the vascular anatomy.  CONTRAST:  116mL OMNIPAQUE IOHEXOL 350 MG/ML SOLN  COMPARISON:  CT scan of April 29, 2014.  FINDINGS: No pneumothorax is noted. Moderate left pleural effusion is noted with associated atelectasis of the left lower lobe. Right lung is clear. There is no evidence of thoracic aortic dissection or aneurysm. There is no definite evidence of pulmonary embolus. No mediastinal mass or adenopathy is noted. No significant  osseous abnormality is noted. Continued presence of drainage catheter seen between the stomach and left kidney. Continued presence of fluid collection seen medial to proximal stomach.  Review of the MIP images confirms the above findings.  IMPRESSION: No evidence of pulmonary embolus.  Moderate left pleural effusion is noted with associated atelectasis of the left lower lobe.  Continued presence of drainage catheter seen between the stomach and left kidney, as well as continued and stable appearance of fluid collection medial to proximal stomach. Please refer to report of CT scan of April 29, 2014.   Electronically Signed   By: Marijo Conception, M.D.   On:  05/09/2014 16:56   Ir Sinus/fist Tube Chk-non Gi  05/17/2014   CLINICAL DATA:  Pancreatic carcinoma, post distal pancreatectomy and splenectomy with persistent leak. Surgical drain remains in place into pigtail catheter had been placed into a retroperitoneal collection ; output has tapered off.  EXAM: RETROPERITONEAL DRAIN CATHETER INJECTION UNDER FLUOROSCOPY  TECHNIQUE: The procedure, risks (including but not limited to bleeding, infection, organ damage ), benefits, and alternatives were explained to the patient. Questions regarding the procedure were encouraged and answered. The patient understands and consents to the procedure.  Survey fluoroscopic inspection reveals stable position of pigtail catheter in the left upper abdomen and surgical drain to the subphrenic space. A small left pleural effusion is noted.  Injection of the left upper quadrant percutaneous pigtail drain catheter demonstrates filling of a small irregular cavity around the drain catheter. With continued injection, contrast tracks back along the catheter and exits the skin entry site. There is no opacification of the pancreatic duct or biliary tree. With aspiration, most of the injected contrast can be removed.  After telephone consultation with Dr. Barry Dienes, the decision was made to proceed  with injection of the left lateral surgical drain catheter. However, the catheter is occluded such that injection using a moderately vigorous pressure did not result in any contrast entering the drain catheter. Inspection demonstrates that the retention sutures is not occlusive. High pressure injection was deferred. Both catheters were returned to bulb drainage.  The patient tolerated the procedure well. No immediate complication.  IMPRESSION: 1. Small residual cavity around the pigtail drain catheter, with NO communication to the pancreatic duct or biliary tree opacified. 2. Occlusion of surgical drain catheter.   Electronically Signed   By: Lucrezia Europe M.D.   On: 05/17/2014 15:03   US Renal  04/20/2014   CLINICAL DATA:  Acute renal failure  EXAM: RENAL/URINARY TRACT ULTRASOUND COMPLETE  COMPARISON:  04/15/2014  FINDINGS: Right Kidney:  Length: 11.7 cm. Echogenicity within normal limits. No mass or hydronephrosis visualized.  Left Kidney:  Length: 12.0 cm. A 2.4 cm cyst is noted in the midportion of the left kidney posteriorly. This corresponds to that seen on recent CT examination.  Bladder:  Decompressed by Foley catheter.  IMPRESSION: Left renal cyst.   Electronically Signed   By: Inez Catalina M.D.   On: 04/20/2014 11:09   Dg Abd Acute W/chest  05/08/2014   CLINICAL DATA:  Left upper quadrant and left lower quadrant abdominal pain.  EXAM: DG ABDOMEN ACUTE W/ 1V CHEST  COMPARISON:  04/29/2014  FINDINGS: Two surgical drains are identified within the left upper quadrant of the abdomen. These are similar in position to 3/30 1/16. There is an IVC filter in place and there are surgical clips from prior cholecystectomy. There is no evidence of dilated bowel loops or free intraperitoneal air. No radiopaque calculi or other significant radiographic abnormality is seen. Heart size and mediastinal contours are within normal limits. Both lungs are clear. Moderate left pleural effusion is again noted.  IMPRESSION: 1.  Nonobstructive bowel gas pattern. 2. Left pleural effusion 3. Similar position of left upper quadrant surgical drains.   Electronically Signed   By: Kerby Moors M.D.   On: 05/08/2014 18:27   US Thoracentesis Asp Pleural Space W/img Guide  05/14/2014   INDICATION: Symptomatic left sided pleural effusion  EXAM: US THORACENTESIS ASP PLEURAL SPACE W/IMG GUIDE  COMPARISON:  Chest radiograph - earlier same day; 05/13/2014; 05/10/2014; CT abdomen pelvis - 05/11/2014  MEDICATIONS: None  COMPLICATIONS:  None immediate  TECHNIQUE: Informed written consent was obtained from the patient after a discussion of the risks, benefits and alternatives to treatment. A timeout was performed prior to the initiation of the procedure.  Initial ultrasound scanning demonstrates a small pleural effusion with multiple internal echogenic septations and apparent tethering of the left lower lobe (representative images 1 through 4). The lower chest was prepped and draped in the usual sterile fashion. 1% lidocaine was used for local anesthesia.  A 5 [place] Yueh sheath needle was advanced into an anechoic component left-sided pleural effusion however only yielded approximately 2 cc of fluid. Yueh sheath needle was then advanced into a separate anechoic component of the left-sided pleural effusion however only approximately 8 cc of fluid was able to be aspirated. Multiple ultrasound images were saved for documentation purposes.  The catheter was removed and a dressing was applied. The patient tolerated the procedure well without immediate post procedural complication. The patient was escorted to have an upright chest radiograph.  FINDINGS: A total of approximately 10 cc of blood tinged serous fluid was removed.  IMPRESSION: Successful ultrasound-guided left sided thoracentesis yielding only 10 cc liters of pleural fluid secondary to marked loculation of the effusion.   Electronically Signed   By: Sandi Mariscal M.D.   On: 05/14/2014 16:06   US  Thoracentesis Asp Pleural Space W/img Guide  05/10/2014   INDICATION: Symptomatic left sided pleural effusion  EXAM: US THORACENTESIS ASP PLEURAL SPACE W/IMG GUIDE  COMPARISON:  CXR 05/09/14.  MEDICATIONS: None  COMPLICATIONS: None immediate  TECHNIQUE: Informed written consent was obtained from the patient after a discussion of the risks, benefits and alternatives to treatment. A timeout was performed prior to the initiation of the procedure.  Initial ultrasound scanning demonstrates a left pleural effusion. The lower chest was prepped and draped in the usual sterile fashion. 1% lidocaine was used for local anesthesia.  Under direct ultrasound guidance, a 19 gauge, 10-cm, Yueh catheter was introduced. An ultrasound image was saved for documentation purposes. The thoracentesis was performed. The catheter was removed and a dressing was applied. The patient tolerated the procedure well without immediate post procedural complication. The patient was escorted to have an upright chest radiograph.  FINDINGS: A total of approximately 500 ml of blood tinged fluid was removed. Requested samples were sent to the laboratory.  IMPRESSION: Successful ultrasound-guided left sided thoracentesis yielding 500 ml of pleural fluid.  Read By:  Tsosie Billing PA-C   Electronically Signed   By: Sandi Mariscal M.D.   On: 05/10/2014 14:14    ASSESSMENT AND PLAN 1.  Sinus tachycardia 2.  Recent fevers 3.  History of pancreatic cancer and she is status post distal pancreatectomy, splenectomy, and partial colectomy on 04/08/14 4.  Left pleural effusion  Plan: Continue diltiazem.  Check echocardiogram.  Consider addition of low-dose beta blocker Signed, Darlin Coco MD

## 2014-05-19 NOTE — Progress Notes (Signed)
ANTIBIOTIC CONSULT NOTE - Follow up  Pharmacy Consult for Cipro  Indication: Empyema   Allergies  Allergen Reactions  . Sulfa Antibiotics Swelling    Patient Measurements: Height: 5' 9.5" (176.5 cm) Weight: 189 lb 6.4 oz (85.911 kg) IBW/kg (Calculated) : 67.35  Vital Signs: Temp: 98.1 F (36.7 C) (04/20 0638) Temp Source: Oral (04/20 4920) BP: 113/58 mmHg (04/20 1007) Pulse Rate: 103 (04/20 0638) Intake/Output from previous day: 04/19 0701 - 04/20 0700 In: 120 [P.O.:120] Out: 693 [Urine:675; Drains:18]  Labs:  Recent Labs  05/17/14 0417 05/18/14 0532 05/19/14 0434  WBC 7.9 11.2* 9.6  HGB 8.7* 9.4* 8.7*  PLT 420* 536* 528*  CREATININE 0.79 0.89 0.85   Estimated Creatinine Clearance: 75.8 mL/min (by C-G formula based on Cr of 0.85).  Microbiology: Recent Results (from the past 720 hour(s))  Clostridium Difficile by PCR     Status: None   Collection Time: 04/25/14  1:53 AM  Result Value Ref Range Status   C difficile by pcr NEGATIVE NEGATIVE Final  MRSA PCR Screening     Status: None   Collection Time: 05/09/14 12:27 PM  Result Value Ref Range Status   MRSA by PCR NEGATIVE NEGATIVE Final    Comment:        The GeneXpert MRSA Assay (FDA approved for NASAL specimens only), is one component of a comprehensive MRSA colonization surveillance program. It is not intended to diagnose MRSA infection nor to guide or monitor treatment for MRSA infections.   Body fluid culture     Status: None   Collection Time: 05/10/14  2:01 PM  Result Value Ref Range Status   Specimen Description PLEURAL  Final   Special Requests NONE  Final   Gram Stain   Final    FEW WBC PRESENT,BOTH PMN AND MONONUCLEAR NO ORGANISMS SEEN Performed at [facility]    Culture   Final    NO GROWTH 3 DAYS Performed at [facility]    Report Status 05/14/2014 FINAL  Final     Assessment: 41 YOF with recently diagnosed adenocarcinoma of the pancreas status post distal  pancreatectomy, splenectomy and partial colectomy on 04/08/2014 with subsequent LUQ abscess- 2 drains still present in left abdomen and chest. Pleural fluid revealed about 9000 WBCs 90% neutrophils on 05/14/14, possibly an extension of abdominal abscess as she has no pneumonia. Abscess culture grew pseudomonas previously. Pharmacy has been consulted to dose Cipro for empyema.  4/11 Doxycycline >>4/15 4/11 Zosyn>>4/17 4/11 Cipro>> 4/17 Metronidazole>>  Tmax: afebrile WBC:  WNL (decreased from 11.2 on 05/18/14) Renal: improved- SCr 0.85 mg/dL, CrCl~75 mL/min  05/19/14: Day #10 Cipro.  Antibiotics narrowed and adjusted to PO per ID recommendations. Surgery documents need for 3-6 weeks of antibiotics.  Last pseuodmonas isolate (abscess drainage 04/16/14) was pan-sensitive. Pleural fluid culture from this admission finalized as no growth. LUQ drain remains in place for loculated pleural effusion.  Patient also on Flagyl PO (Day #4) as ID deemed patient for high risk of CDiff.  Goal of Therapy:  Appropriate antibiotic dosing Eradication of infection.   Plan:  -Continue Cipro 500mg  PO BID. No adjustments needed. -Renal function stable.  Do not anticipate further dose adjustment needs.  Pharmacy will sign off.  Please re-consult if needed  Gloriajean Dell, PharmD Candidate   Addendum 05/19/2014 11:44 AM  I agree with the above assessment and plan. Hershal Coria, PharmD Pager: [phone] 05/19/2014

## 2014-05-19 NOTE — Progress Notes (Signed)
Patient ID: [name], female   DOB: 02/20/1945, 69 y.o.   MRN: [id number]      [address]       [place] [address]             [phone]        Raphaella A Skillen [place] Medical Record #[id number] Date of Birth: 1945/08/21  Referring: Dr Norman Clay Primary Care: Mauricio Po, FNP  Chief Complaint:    Chief Complaint  Patient presents with  . Chest Pain    History of Present Illness:     Patient is s/p DISTAL PANCREATECTOMY/SPLENECTOMY WITH PARTIAL COLECTOMY 4/17 complicated by abdominal abscess drained iwith  Pigtail and surgical drain. Follow up ct scan done of chest with evidence of pleural effusion. Currently not toxic/sepetic  Current Activity/ Functional Status: Patient was independent with mobility/ambulation, transfers, ADL's, IADL's.   Zubrod Score: At the time of surgery this patient's most appropriate activity status/level should be described as: []     0    Normal activity, no symptoms []     1    Restricted in physical strenuous activity but ambulatory, able to do out light work []     2    Ambulatory and capable of self care, unable to do work activities, up and about                 more than 50%  Of the time                            [x]     3    Only limited self care, in bed greater than 50% of waking hours []     4    Completely disabled, no self care, confined to bed or chair []     5    Moribund  Past Medical History  Diagnosis Date  . Glaucoma   . Clotting disorder   . DVT (deep venous thrombosis)     LEFT  . GERD (gastroesophageal reflux disease)   . Hypercholesterolemia   . Peripheral neuropathy   . Essential tremor   . Diverticulosis   . Arthritis     knees and back  . Cancer     Pancreatic  . Hypertension   . Colon polyps   . UTI (lower urinary tract infection)   . Complication of anesthesia     slow to wak up   . Pulmonary embolism     Past Surgical History  Procedure Laterality Date  . Cholecystectomy      . Abdominal hysterectomy    . Knee surgery Bilateral   . Greenfield filter    . Back surgery    . Eus N/A 02/26/2014    Procedure: UPPER ENDOSCOPIC ULTRASOUND (EUS) LINEAR;  Surgeon: Beryle Beams, MD;  Location: [facility];  Service: Endoscopy;  Laterality: N/A;  . Fine needle aspiration N/A 02/26/2014    Procedure: FINE NEEDLE ASPIRATION (FNA) LINEAR;  Surgeon: Beryle Beams, MD;  Location: [facility];  Service: Endoscopy;  Laterality: N/A;  . Breast lumpectomy Right   . Spine surgery    . Xi robotic assisted laparoscopic distal pancreatectomy N/A 04/08/2014    Procedure: XI ROBOTIC ASSISTED CONVERTED LAPAROSCOPIC HAND ASSITED DISTAL PANCREATECTOMY/SPLENECTOMY WITH PARTIAL COLECTOMY;  Surgeon: [name], MD;  Location: [facility];  Service: General;  Laterality: N/A;  . Colon surgery    . Diagnostic laparoscopy      History  Smoking status  . Current Every Day Smoker -- 0.50 packs/day for 45 years  . Types: Cigarettes  Smokeless tobacco  . Never Used    History  Alcohol Use  . 0.0 oz/week  . 0 Standard drinks or equivalent per week    Comment: occ- glass of brandy or wine socially     History   Social History  . Marital Status: Divorced    Spouse Name: N/A  . Number of Children: 3  . Years of Education: 14   Occupational History  . Retired    Social History Main Topics  . Smoking status: Current Every Day Smoker -- 0.50 packs/day for 45 years    Types: Cigarettes  . Smokeless tobacco: Never Used  . Alcohol Use: 0.0 oz/week    0 Standard drinks or equivalent per week     Comment: occ- glass of brandy or wine socially   . Drug Use: No  . Sexual Activity: Not on file   Other Topics Concern  . Not on file   Social History Narrative   Divorced, 3 children   Right handed   Associates degree   3 per week   Fun: Sleep, watch tv, reading   Denies any religious beliefs that effect health care    Allergies  Allergen Reactions  . Sulfa Antibiotics Swelling     Current Facility-Administered Medications  Medication Dose Route Frequency Provider Last Rate Last Dose  . 0.9 %  sodium chloride infusion  250 mL Intravenous PRN Debbe Odea, MD 10 mL/hr at 05/12/14 0900 250 mL at 05/12/14 0900  . acetaminophen (TYLENOL) tablet 500 mg  500 mg Oral TID WC & HS Michael Boston, MD   500 mg at 05/19/14 0016  . alum & mag hydroxide-simeth (MAALOX/MYLANTA) 200-200-20 MG/5ML suspension 30 mL  30 mL Oral Q6H PRN Michael Boston, MD      . bismuth subsalicylate (PEPTO BISMOL) 262 MG/15ML suspension 30 mL  30 mL Oral Q8H PRN Michael Boston, MD      . ciprofloxacin (CIPRO) tablet 500 mg  500 mg Oral BID Theodis Blaze, MD   500 mg at 05/18/14 2042  . diltiazem (CARDIZEM) tablet 30 mg  30 mg Oral 4 times per day Debbe Odea, MD   30 mg at 05/19/14 0552  . enoxaparin (LOVENOX) injection 130 mg  1.5 mg/kg Subcutaneous Q24H Donald Prose Runyon, RPH   130 mg at 05/18/14 1254  . HYDROcodone-acetaminophen (NORCO) 10-325 MG per tablet 0.5-1 tablet  0.5-1 tablet Oral Q6H PRN Michael Boston, MD   1 tablet at 05/18/14 1014  . latanoprost (XALATAN) 0.005 % ophthalmic solution 1 drop  1 drop Both Eyes QHS Debbe Odea, MD   1 drop at 05/19/14 0016  . lip balm (CARMEX) ointment 1 application  1 application Topical BID Michael Boston, MD   1 application at 94/50/38 2200  . magic mouthwash  15 mL Oral QID PRN Michael Boston, MD      . menthol-cetylpyridinium (CEPACOL) lozenge 3 mg  1 lozenge Oral PRN Michael Boston, MD      . metoprolol (LOPRESSOR) injection 5 mg  5 mg Intravenous Q4H PRN Theodis Blaze, MD   5 mg at 05/15/14 1726  . metroNIDAZOLE (FLAGYL) tablet 500 mg  500 mg Oral 3 times per day Theodis Blaze, MD   500 mg at 05/19/14 0552  . morphine 2 MG/ML injection 2-4 mg  2-4 mg Intravenous Q3H PRN Michael Boston, MD   2 mg  at 05/18/14 2042  . pantoprazole (PROTONIX) EC tablet 40 mg  40 mg Oral BID AC Tanda Rockers, MD   40 mg at 05/18/14 1737  . phenol (CHLORASEPTIC) mouth spray 2 spray  2 spray  Mouth/Throat PRN Michael Boston, MD      . primidone (MYSOLINE) tablet 50 mg  50 mg Oral QHS Debbe Odea, MD   50 mg at 05/19/14 0015  . promethazine (PHENERGAN) injection 12.5 mg  12.5 mg Intravenous Q6H PRN Gardiner Barefoot, NP   12.5 mg at 05/18/14 2249  . psyllium (HYDROCIL/METAMUCIL) packet 1 packet  1 packet Oral Daily Michael Boston, MD   1 packet at 05/15/14 1133  . saccharomyces boulardii (FLORASTOR) capsule 250 mg  250 mg Oral BID Michael Boston, MD   250 mg at 05/19/14 0015  . sodium chloride 0.9 % injection 3 mL  3 mL Intravenous Q12H Debbe Odea, MD   3 mL at 05/18/14 1005  . sodium chloride 0.9 % injection 3 mL  3 mL Intravenous PRN Debbe Odea, MD   3 mL at 05/10/14 1417  . sucralfate (CARAFATE) 1 GM/10ML suspension 1 g  1 g Oral TID WC & HS Debbe Odea, MD   1 g at 05/19/14 0016  . tamsulosin (FLOMAX) capsule 0.4 mg  0.4 mg Oral Daily Debbe Odea, MD   0.4 mg at 05/18/14 1009    Prescriptions prior to admission  Medication Sig Dispense Refill Last Dose  . atorvastatin (LIPITOR) 40 MG tablet Take 1 tablet (40 mg total) by mouth daily. 90 tablet 0 05/08/2014 at Unknown time  . diltiazem (CARTIA XT) 120 MG 24 hr capsule Take 1 capsule (120 mg total) by mouth daily. 90 capsule 0 05/08/2014 at Unknown time  . DOXYCYCLINE HYCLATE PO Take 100 mg by mouth 2 (two) times daily.   05/08/2014 at Unknown time  . enoxaparin (LOVENOX) 120 MG/0.8ML injection Inject 0.8 mLs (120 mg total) into the skin daily. 30 Syringe 0 05/08/2014 at Unknown time  . latanoprost (XALATAN) 0.005 % ophthalmic solution Place 1 drop into both eyes at bedtime.   05/08/2014 at Unknown time  . pantoprazole (PROTONIX) 40 MG tablet TAKE 1 TABLET BY MOUTH DAILY 30 tablet 2 05/09/2014 at Unknown time  . primidone (MYSOLINE) 50 MG tablet Take 50 mg by mouth at bedtime. Patient takes at nite   05/08/2014 at Unknown time  . propranolol ER (INDERAL LA) 120 MG 24 hr capsule TAKE ONE CAPSULE BY MOUTH DAILY 30 capsule 2 05/08/2014 at Unknown  time  . sucralfate (CARAFATE) 1 GM/10ML suspension Take 10 mLs (1 g total) by mouth 4 (four) times daily -  with meals and at bedtime. 420 mL 0 05/08/2014 at Unknown time  . tamsulosin (FLOMAX) 0.4 MG CAPS capsule Take 1 capsule (0.4 mg total) by mouth daily. 30 capsule 0 05/08/2014 at Unknown time  . traMADol (ULTRAM) 50 MG tablet Take 1 tablet (50 mg total) by mouth every 6 (six) hours as needed. 60 tablet 0 05/09/2014 at Unknown time  . loperamide (IMODIUM) 2 MG capsule Take 1 capsule (2 mg total) by mouth 3 (three) times daily before meals. 30 capsule 0 05/06/2014    Family History  Problem Relation Age of Onset  . Diabetes Father   . Cancer Father 79    prostate  . Hyperlipidemia Father   . Cataracts Maternal Grandmother   . Hypertension Mother   . Arthritis Mother   . Heart disease Mother   . Glaucoma  Sister   . Cancer Sister 64    Breast  . Cancer Paternal Uncle     throat +cig  . Diabetes Paternal Grandmother      Review of Systems:      Cardiac Review of Systems: Y or N  Chest Pain [ n   ]  Resting SOB [ y  ] Exertional SOB  Blue.Reese  ]  Orthopnea Florencio.Farrier  ]   Pedal Edema [ y  ]    Palpitations [n  ] Syncope  Florencio.Farrier  ]   Presyncope [ n ]  General Review of Systems: [Y] = yes [  ]=no Constitional: recent weight change [ y ]; anorexia [ y ]; fatigue Blue.Reese  ]; nausea [  ]; night sweats [  ]; fever [  ]; or chills [  ]                                                               Dental: poor dentition[  ]; Last Dentist visit:   Eye : blurred vision [  ]; diplopia [   ]; vision changes [  ];  Amaurosis fugax[  ]; Resp: cough [  ];  wheezing[  ];  hemoptysis[  ]; shortness of breath[  ]; paroxysmal nocturnal dyspnea[  ]; dyspnea on exertion[  ]; or orthopnea[  ];  GI:  gallstones[  ], vomiting[  ];  dysphagia[  ]; melena[  ];  hematochezia [  ]; heartburn[  ];   Hx of  Colonoscopy[  ]; GU: kidney stones [  ]; hematuria[  ];   dysuria [  ];  nocturia[  ];  history of     obstruction [  ]; urinary  frequency [  ]             Skin: rash, swelling[  ];, hair loss[  ];  peripheral edema[y  ];  or itching[  ]; Musculosketetal: myalgias[y  ];  joint swelling[  ];  joint erythema[  ];  joint pain[  ];  back pain[  ];  Heme/Lymph: bruising[  ];  bleeding[  ];  anemia[  ];  Neuro: TIA[  ];  headaches[  ];  stroke[  ];  vertigo[  ];  seizures[  ];   paresthesias[  ];  difficulty walking[  ];  Psych:depression[  ]; anxiety[  ];  Endocrine: diabetes[  ];  thyroid dysfunction[  ];  Immunizations: Flu [  ]; Pneumococcal[  ];  Other:  Physical Exam: BP 113/58 mmHg  Pulse 103  Temp(Src) 98.1 F (36.7 C) (Oral)  Resp 20  Ht 5' 9.5" (1.765 m)  Wt 189 lb 6.4 oz (85.911 kg)  BMI 27.58 kg/m2  SpO2 99%   General appearance: alert, cooperative and appears older than stated age Head: Normocephalic, without obvious abnormality, atraumatic Neck: no adenopathy, no carotid bruit, no JVD, supple, symmetrical, trachea midline and thyroid not enlarged, symmetric, no tenderness/mass/nodules Lymph nodes: Cervical, supraclavicular, and axillary nodes normal. Resp: diminished breath sounds bibasilar Back: symmetric, no curvature. ROM normal. No CVA tenderness. Cardio: regular rate and rhythm, S1, S2 normal, no murmur, click, rub or gallop GI: moderate distention with two drains from left flank Extremities: extremities normal, atraumatic, no cyanosis or edema and Homans sign is negative, no  sign of DVT Neurologic: Grossly normal  Diagnostic Studies & Laboratory data:     Recent Radiology Findings:   Ct Chest W Contrast  05/18/2014   CLINICAL DATA:  Loculated left pleural effusion, chest pain.  EXAM: CT CHEST WITH CONTRAST  TECHNIQUE: Multidetector CT imaging of the chest was performed during intravenous contrast administration.  CONTRAST:  26mL OMNIPAQUE IOHEXOL 300 MG/ML  SOLN  COMPARISON:  CT scan of May 09, 2014.  FINDINGS: No pneumothorax is noted. Mild to moderate left pleural effusion is noted with  associated atelectasis of the left lower lobe. The superior portion of this effusion appears to be loculated. There is no evidence of thoracic aortic dissection or aneurysm. Visualized portions of pulmonary arteries appear normal. No mediastinal mass or adenopathy is noted no significant osseous abnormality is noted. Percutaneous drain is again noted between the stomach and left kidney.  IMPRESSION: Mild to moderate left pleural effusion is noted with adjacent atelectasis of the left lower lobe. The smaller superior portion of this effusion appears to be loculated. This appears to be smaller compared to prior exam.   Electronically Signed   By: Marijo Conception, M.D.   On: 05/18/2014 12:13   Ir Sinus/fist Tube Chk-non Gi  05/17/2014   CLINICAL DATA:  Pancreatic carcinoma, post distal pancreatectomy and splenectomy with persistent leak. Surgical drain remains in place into pigtail catheter had been placed into a retroperitoneal collection ; output has tapered off.  EXAM: RETROPERITONEAL DRAIN CATHETER INJECTION UNDER FLUOROSCOPY  TECHNIQUE: The procedure, risks (including but not limited to bleeding, infection, organ damage ), benefits, and alternatives were explained to the patient. Questions regarding the procedure were encouraged and answered. The patient understands and consents to the procedure.  Survey fluoroscopic inspection reveals stable position of pigtail catheter in the left upper abdomen and surgical drain to the subphrenic space. A small left pleural effusion is noted.  Injection of the left upper quadrant percutaneous pigtail drain catheter demonstrates filling of a small irregular cavity around the drain catheter. With continued injection, contrast tracks back along the catheter and exits the skin entry site. There is no opacification of the pancreatic duct or biliary tree. With aspiration, most of the injected contrast can be removed.  After telephone consultation with Dr. Barry Dienes, the decision was  made to proceed with injection of the left lateral surgical drain catheter. However, the catheter is occluded such that injection using a moderately vigorous pressure did not result in any contrast entering the drain catheter. Inspection demonstrates that the retention sutures is not occlusive. High pressure injection was deferred. Both catheters were returned to bulb drainage.  The patient tolerated the procedure well. No immediate complication.  IMPRESSION: 1. Small residual cavity around the pigtail drain catheter, with NO communication to the pancreatic duct or biliary tree opacified. 2. Occlusion of surgical drain catheter.   Electronically Signed   By: Lucrezia Europe M.D.   On: 05/17/2014 15:03     I have independently reviewed the above radiologic studies.  Recent Lab Findings: Lab Results  Component Value Date   WBC 9.6 05/19/2014   HGB 8.7* 05/19/2014   HCT 28.3* 05/19/2014   PLT 528* 05/19/2014   GLUCOSE 91 05/19/2014   CHOL 167 05/03/2013   TRIG 82 04/30/2014   HDL 68 05/03/2013   LDLCALC 79 05/03/2013   ALT 16 05/16/2014   AST 36 05/16/2014   NA 138 05/19/2014   K 3.7 05/19/2014   CL 107 05/19/2014   CREATININE  0.85 05/19/2014   BUN 8 05/19/2014   CO2 23 05/19/2014   TSH 1.271 05/19/2014   INR 1.08 05/09/2014   HGBA1C 5.3 05/03/2013      Assessment / Plan:     Likely sympathetic effusion related to abdominal surgery and infection, not obviously an empyema that is actively infected currently, with current situation would NOT recommend GA and left VATS. If increased fever, increased wbc consider CT guided placement in drainage tube in to most posterior left chest fluid collection. On current CT of chest effusion has decreased but left lower lobe collapse has increased - encourage  Pul toilet and pulmonary exercise  Discussed with Dr Barry Dienes    I  spent 30 minutes counseling the patient face to face and 50% or more the  time was spent in counseling and coordination of care.  The total time spent in the appointment was 40 minutes.    Grace Isaac MD      [address] [place] [address] Office [phone]   Beeper [phone]  05/19/2014 12:06 PM

## 2014-05-19 NOTE — Progress Notes (Signed)
Patient ID: [name], female   DOB: 1945/03/05, 69 y.o.   MRN: [id number]  [facility] PROGRESS NOTE  MADISEN LUDVIGSEN CHY:[id number] DOB: 12-19-1945 DOA: 05/09/2014 PCP: Mauricio Po, FNP   Brief narrative:    69 y.o. 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 She has followed with Dr Burr Medico with plan is to start chemo soon. Pt presented with left sided chest pain and upper abd pain and was found to have moderate sized pleural effusion requiring IR guided left thoracentesis 4/11 with removal of 500 cc of fluid.   Patient's hospital course has been complicated by persistent loculated pleural effusion possibly causing persistent tachycardia and requiring consultation by cardiothoracic and cardiology team, respectively.   Major events since admission: 4/15 - CXR with worsening pleural effusion, IR re consulted for consideration of repeat thoracentesis, only 11 cc able to remove due to significant loculation of the fluid  4/16 - pt reports feeling better, pleural fluid analysis with no growth 4/17 - d/c with Dr. Tommy Medal, no need for double Pseudomonas coverage, rec's to d/c Zosyn, continue Cipro and add Flagyl  4/18 - pt reports not voiding on her own and aware may need longer term foley cath but hesitant to have it placed today  4/19 - cardiothoracic surgery consulted for further recommendation on pleural effusion, cardiology consulted for persistent tachycardia, also WBC up from 7.9 --> 11.2 and CT chest requested per Dr. Barry Dienes   Subjective: Patient feels well this morning. She did have an episode of nausea last night. Tolerated her breakfast this morning. Her daughter is at bedside.    Assessment/Plan:    Left chest/ abdominal pain - partly due to drains and possibly due to effusion, ? parapneumonic effusion related to recent LUQ abscess  - status post left thoracentesis 4/11 with 500 cc fluid removed, exudate with no bact growth to date  - d/w ID on call, no need for double coverage for Pseudomonas - recommendation was to d/c Zosyn and continue Cipro with addition of Flagyl due to risk of C.Diff (changes made 4/17) - repeat CXR 4/16 notable for worsening pleural effusion, repeat thoracentesis 4/16 with only 10 cc of fluid able to remove  - Dr. Barry Dienes consulted cardiothoracic surgery team for further assistance on pleural effusion. Thoracic surgery did not recommend any invasive procedures. A drainage tube would be one option. Considering the patient is stable and the fluid collection has decreased in size would follow this conservatively.  Left pleural effusion - possibly related to extension of the abd abscess - still fairly diminished breath sounds on the left side, s/p thoracentesis 4/11 (500 cc removed) and again 4/16 (only 10 cc removed on 4/16) - CXR 4/16 with worsening effusion but respiratory status remains stable - pleural fluid with no bacterial growth to date - CT chest continues to show left-sided pleural effusion. The loculated collection appears to be decreased in size.  LUQ abscess with recent cultures growing Pseudomonas - status post drainage on 3/18 with cultures positive for Pseudomonas  - WBC continued trending down - repeat CT abd 4/12 with decreasing size of the abscess  - on 4/17 stopped Zosyn and continued Cipro, added flagyl per ID Dr. Tommy Medal recommendations  - pt  so far tolerating well  Slight volume overload - weight since admission from 190 to 195 lbs on 4/12 --> 203 lbs --> 193 lbs --> 191 lbs --> 189 lbs - stopped IVF 4/13 and gave low dose lasix 20 mg IV x 1 dose (4/13), gave additional dose of Lasix  4/14 and 4/15 - has been off  Lasix since 4/16 and weight continues trending down  - monitor weight and strict I's and O's  Persistent tachycardia - pt fairly asymptomatic with HR in 120- 130s - cardiology team consulted for further assistance  - started on Cardizem 30 mg Q6 hours. Heart rate is improved - Echocardiogram is pending   Hypokalemia - supplemented and WNL   Prior DVT s/p IVC filter - continue Lovenox  per pharmacy   Acute renal failure - Was pre renal  - Cr now remains WNL   Anemia of acute illness imposed on anemia of chronic disease, malignancy - no signs of active bleeding, Hg remains stable  - no indication for transfusion at this time   Sepsis secondary to the above, L pleural effusion, ? parapneumonic - Now stable.  - continue ABX as noted above  Pancreatic cancer of tail, s/p distal pancreatectomy, splenectomy, & partial colectomy 04/08/2014 - Management per general surgery.   Severe PCM - in the context of acute illness - nutritionist consulted, pt tolerating diet well but still poor oral intake   Urinary retention - despite multiple voiding trails after surgery, she continued to retain urine - foley taken out on 4/16 but since still with urinary retention, pt made aware she needs foley to be placed back - initially pt reluctant to have foley placed but in agreement this afternoon  - Leave Foley in for now. Can try another voiding trial once she is more mobile in the rehabilitation center. If she continues to retain urine despite increased mobility she may need follow-up with a urologist.  Tremors   - on Primidone and stable at this time   Acute functional quadriplegia - in the setting of acute illness - still very weak but trying to participate in PT   DVT prophylaxis:  on Lovenox full dose  Code Status: Full.  Family Communication:  plan of care discussed with the patient and her daughter who was at bedside. Disposition Plan: Not ready for d/c. Await stability in dietary  intake. Possible discharge to SNF in 1-2 days.   IV access:  Peripheral IV  Procedures and diagnostic studies:    Ct Abdomen Pelvis Wo Contrast  05/11/2014   Two left upper quadrant surgical drains remain in place and unchanged in position. No significant fluid collection within the left upper quadrant.  Slight interval decrease in size of a fluid collection over the surgical bed adjacent the distal pancreatectomy measuring 2.1 x 2.7 cm (previously 2.7 x 3 cm).  Interval improvement small amount of free pelvic fluid.  Stable stranding of the mesenteric fat adjacent the colonic anastomosis in the left mid abdomen likely postsurgical change.  Other stable chronic changes within the abdomen.  Continued moderate left pleural effusion with associated compressive atelectasis. Tiny right pleural fluid collection with associated atelectasis.      Dg Chest 1 View   05/10/2014   Left effusion smaller following thoracentesis. There is a small left effusion with left base atelectasis. Subpulmonic chest tube remains on the left as does a drain in the upper abdomen on the left. Right lung is clear. No change in cardiac silhouette. No  demonstrable pneumothorax.     Dg Chest 2 View  05/09/2014   Progressed left pleural effusion, now moderate to large. Associated decreased ventilation in the left lung. 2. Lower lung volumes.     Ct Angio Chest Pe W/cm &/or Wo Cm  05/09/2014   No evidence of pulmonary embolus.  Moderate left pleural effusion is noted with associated atelectasis of the left lower lobe.  Continued presence of drainage catheter seen between the stomach and left kidney, as well as continued and stable appearance of fluid collection medial to proximal stomach.   Dg Abd Acute W/chest  05/08/2014  Nonobstructive bowel gas pattern. Left pleural effusion  Similar position of left upper quadrant surgical drains.    US Thoracentesis Asp Pleural Space W/img Guide 05/10/2014  Successful ultrasound-guided left sided  thoracentesis yielding 500 ml of pleural fluid.    Medical Consultants:  PCCM - has signed off on 4/16 IR Surgery  Cardiology  Cardiothoracic surgery   Other Consultants:  PT/OT  IAnti-Infectives:   Zosyn 4/10 --> 4/17  Cipro 4/10 --> Flagyl 4/17 -->  Bonnielee Haff, MD  [facility] Pager [phone]  If 7PM-7AM, please contact night-coverage [email or web address] Password [email or web address] 05/19/2014, 9:30 AM   LOS: 10 days    Objective: Filed Vitals:   05/18/14 1335 05/18/14 1448 05/18/14 2115 05/19/14 0638  BP: 111/72 102/63 117/64 113/58  Pulse:  132 123 103  Temp:  98 F (36.7 C) 98.4 F (36.9 C) 98.1 F (36.7 C)  TempSrc:  Oral Oral Oral  Resp:  20 16 20   Height:      Weight:      SpO2:  100% 97% 99%    Intake/Output Summary (Last 24 hours) at 05/19/14 0930 Last data filed at 05/19/14 0641  Gross per 24 hour  Intake    120 ml  Output    693 ml  Net   -573 ml    Exam:   General:  Awake and alert. In no distress.  Cardiovascular: Regular rhythm, tachycardic, S1/S2, no rubs, no gallops  Respiratory: Diminished air movement on the left side, no crackles   Abdomen: Soft, non tender in epigastric area, non distended, bowel sounds present, drains in place   Extremities: pulses DP and PT palpable bilaterally  Data Reviewed: Basic Metabolic Panel:  Recent Labs Lab 05/14/14 0512 05/15/14 0846 05/16/14 0422 05/17/14 0417 05/18/14 0532 05/19/14 0434  NA 137  --  135 133* 135 138  K 3.2*  --  3.2* 3.0* 4.3 3.7  CL 103  --  101 99 104 107  CO2 24  --  26 24 23 23   GLUCOSE 104*  --  107* 94 113* 91  BUN 8  --  8 6 8 8   CREATININE 0.99  --  1.05 0.79 0.89 0.85  CALCIUM 8.4  --  8.4 8.3* 8.7 8.4  MG  --  1.3* 1.9 1.8 1.7 1.6   Liver Function Tests:  Recent Labs Lab 05/16/14 0422  AST 36  ALT 16  ALKPHOS 88  BILITOT 0.3  PROT 6.8  ALBUMIN 2.8*    Recent Labs Lab 05/16/14 0422  AMYLASE 48   CBC:  Recent Labs Lab 05/14/14 0512 05/16/14 0422  05/17/14 0417 05/18/14 0532 05/19/14 0434  WBC 7.3 9.3 7.9 11.2* 9.6  HGB 8.8* 8.7* 8.7* 9.4* 8.7*  HCT 28.7* 28.3* 27.5* 30.6* 28.3*  MCV 94.7 93.7 93.2 93.9 94.3  PLT 431* 410* 420* 536* 528*    Recent Results (from the past 240  hour(s))  MRSA PCR Screening     Status: None   Collection Time: 05/09/14 12:27 PM  Result Value Ref Range Status   MRSA by PCR NEGATIVE NEGATIVE Final  Body fluid culture     Status: None (Preliminary result)   Collection Time: 05/10/14  2:01 PM  Result Value Ref Range Status   Specimen Description PLEURAL  Final   Special Requests NONE  Final   Gram Stain   Final    FEW WBC PRESENT,BOTH PMN AND MONONUCLEAR NO ORGANISMS SEEN    Culture   Final    NO GROWTH 1 DAY Performed at [facility]    Report Status PENDING  Incomplete     Scheduled Meds: . acetaminophen  500 mg Oral TID WC & HS  . ciprofloxacin  500 mg Oral BID  . diltiazem  30 mg Oral 4 times per day  . enoxaparin (LOVENOX) injection  1.5 mg/kg Subcutaneous Q24H  . latanoprost  1 drop Both Eyes QHS  . lip balm  1 application Topical BID  . metroNIDAZOLE  500 mg Oral 3 times per day  . pantoprazole  40 mg Oral BID AC  . primidone  50 mg Oral QHS  . psyllium  1 packet Oral Daily  . saccharomyces boulardii  250 mg Oral BID  . sodium chloride  3 mL Intravenous Q12H  . sucralfate  1 g Oral TID WC & HS  . tamsulosin  0.4 mg Oral Daily    Continuous Infusions:

## 2014-05-19 NOTE — Progress Notes (Signed)
[facility]  [address] [place], [place] [address] Phone: [phone] FAX: [phone]    NORITA MEIGS [id number] 22-Nov-1945  CARE TEAM:  PCP: Mauricio Po, [place]  Outpatient Care Team: Patient Care Team: Golden Circle, FNP as PCP - General (Family Medicine) Carol Ada, MD as Consulting Physician (Gastroenterology) Truitt Merle, MD as Consulting Physician (Hematology) [name], MD as Consulting Physician (General Surgery)  Inpatient Treatment Team: Treatment Team: Attending Provider: Bonnielee Haff, MD; Rounding Team: Redmond Baseman, MD; Registered Nurse: Alfonse Spruce, RN; Consulting Physician: [name], MD; Technician: Lucila Maine, NT; Registered Nurse: Beryle Quant, RN; Technician: Mertha Baars, NT; Consulting Physician: Grace Isaac, MD; Consulting Physician: Michae Kava Lbcardiology, MD  Problem List:   Principal Problem:   Pleural effusion Active Problems:   Hx pulmonary embolism   HTN (hypertension)   DVT (deep venous thrombosis)   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  Loculated left pleural effusion and chest pain s/p drainage & Abx  Plan: Continue antibiotics and drainage. - cultures from pleural effusion negative.  3-6 weeks Abx total.  Thoracic evaluated patient and would hold on invasive therapy.  They thought pigtail drain if more symptomatic, but given patient's status, would not recommend at this time.    Continue diet as tolerated. Nutrition key - check nutrition labs.  Suspect much related to mod/severe malnutrition.  Urinary retention - catheter.   Follow up with urology.    Bowel regimen w probiotics.  -VTE prophylaxis- SCDs, etc  -Mobilize as tolerated to help recovery.    Home per [facility] when diet/mobility stable.   Would plan to keep both drains at discharge.   Subjective:  Doing a bit better. Passing gas and having BM. Pain controlled with meds. Able to ambulate with walker. Tolerating small meals.    Objective:  Vital signs:  Filed Vitals:   05/18/14 1335 05/18/14 1448 05/18/14 2115 05/19/14 0638  BP: 111/72 102/63 117/64 113/58  Pulse:  132 123 103  Temp:  98 F (36.7 C) 98.4 F (36.9 C) 98.1 F (36.7 C)  TempSrc:  Oral Oral Oral  Resp:  _0 Height:      Weight:      SpO2:  100% 97% 99%    Last BM Date: 05/18/14  Intake/Output   Yesterday:  04/19 0701 - 04/20 0700 In: 120 [P.O.:120] Out: 52 [Urine:675; Drains:18]  Bowel function:  Flatus: y  BM: y  Drain: scant w green tinge  Physical Exam:  General: Pt awake/alert/oriented x4 in no acute distress Eyes: Sclera clear.  No icterus Psych:  No delerium/psychosis/paranoia.Talkative. HENT: Normocephalic. CV: Tachycardic. Regular rhythm. Resp: Breathing non-labored. Abdomen: Soft.  Nondistended. Nontender.  No evidence of peritonitis.  No incarcerated hernias. Ext:  SCDs BLE.  No major edema.  No cyanosis Skin: No petechiae / purpura  Results:   Labs: Results for orders placed or performed during the hospital encounter of 05/09/14 (from the past 48 hour(s))  CBC     Status: Abnormal   Collection Time: 05/18/14  5:32 AM  Result Value Ref Range   WBC 11.2 (H) 4.0 - 10.5 K/uL  RBC 3.26 (L) 3.87 - 5.11 MIL/uL   Hemoglobin 9.4 (L) 12.0 - 15.0 g/dL   HCT 30.6 (L) 36.0 - 46.0 %   MCV 93.9 78.0 - 100.0 fL   MCH 28.8 26.0 - 34.0 pg   MCHC 30.7 30.0 - 36.0 g/dL   RDW 16.3 (H) 11.5 - 15.5 %   Platelets 536 (H) 150 - 400 K/uL  Basic metabolic panel     Status: Abnormal   Collection Time: 05/18/14  5:32 AM  Result Value Ref Range    Sodium 135 135 - 145 mmol/L   Potassium 4.3 3.5 - 5.1 mmol/L    Comment: DELTA CHECK NOTED REPEATED TO VERIFY NO VISIBLE HEMOLYSIS    Chloride 104 96 - 112 mmol/L   CO2 23 19 - 32 mmol/L   Glucose, Bld 113 (H) 70 - 99 mg/dL   BUN 8 6 - 23 mg/dL   Creatinine, Ser 0.89 0.50 - 1.10 mg/dL   Calcium 8.7 8.4 - 10.5 mg/dL   GFR calc non Af Amer 66 (L) >90 mL/min   GFR calc Af Amer 76 (L) >90 mL/min    Comment: (NOTE) The eGFR has been calculated using the CKD EPI equation. This calculation has not been validated in all clinical situations. eGFR's persistently <90 mL/min signify possible Chronic Kidney Disease.    Anion gap 8 5 - 15  Magnesium     Status: None   Collection Time: 05/18/14  5:32 AM  Result Value Ref Range   Magnesium 1.7 1.5 - 2.5 mg/dL  TSH     Status: None   Collection Time: 05/19/14  4:34 AM  Result Value Ref Range   TSH 1.271 0.350 - 4.500 uIU/mL  CBC     Status: Abnormal   Collection Time: 05/19/14  4:34 AM  Result Value Ref Range   WBC 9.6 4.0 - 10.5 K/uL   RBC 3.00 (L) 3.87 - 5.11 MIL/uL   Hemoglobin 8.7 (L) 12.0 - 15.0 g/dL   HCT 28.3 (L) 36.0 - 46.0 %   MCV 94.3 78.0 - 100.0 fL   MCH 29.0 26.0 - 34.0 pg   MCHC 30.7 30.0 - 36.0 g/dL   RDW 16.6 (H) 11.5 - 15.5 %   Platelets 528 (H) 150 - 400 K/uL  Basic metabolic panel     Status: Abnormal   Collection Time: 05/19/14  4:34 AM  Result Value Ref Range   Sodium 138 135 - 145 mmol/L   Potassium 3.7 3.5 - 5.1 mmol/L   Chloride 107 96 - 112 mmol/L   CO2 23 19 - 32 mmol/L   Glucose, Bld 91 70 - 99 mg/dL   BUN 8 6 - 23 mg/dL   Creatinine, Ser 0.85 0.50 - 1.10 mg/dL   Calcium 8.4 8.4 - 10.5 mg/dL   GFR calc non Af Amer 69 (L) >90 mL/min   GFR calc Af Amer 80 (L) >90 mL/min    Comment: (NOTE) The eGFR has been calculated using the CKD EPI equation. This calculation has not been validated in all clinical situations. eGFR's persistently <90 mL/min signify possible Chronic Kidney Disease.    Anion gap 8 5  - 15  Magnesium     Status: None   Collection Time: 05/19/14  4:34 AM  Result Value Ref Range   Magnesium 1.6 1.5 - 2.5 mg/dL    Imaging / Studies: Ct Chest W Contrast  05/18/2014   CLINICAL DATA:  Loculated left pleural effusion, chest pain.  EXAM:  CT CHEST WITH CONTRAST  TECHNIQUE: Multidetector CT imaging of the chest was performed during intravenous contrast administration.  CONTRAST:  64m OMNIPAQUE IOHEXOL 300 MG/ML  SOLN  COMPARISON:  CT scan of May 09, 2014.  FINDINGS: No pneumothorax is noted. Mild to moderate left pleural effusion is noted with associated atelectasis of the left lower lobe. The superior portion of this effusion appears to be loculated. There is no evidence of thoracic aortic dissection or aneurysm. Visualized portions of pulmonary arteries appear normal. No mediastinal mass or adenopathy is noted no significant osseous abnormality is noted. Percutaneous drain is again noted between the stomach and left kidney.  IMPRESSION: Mild to moderate left pleural effusion is noted with adjacent atelectasis of the left lower lobe. The smaller superior portion of this effusion appears to be loculated. This appears to be smaller compared to prior exam.   Electronically Signed   By: JMarijo Conception M.D.   On: 05/18/2014 12:13   Ir Sinus/fist Tube Chk-non Gi  05/17/2014   CLINICAL DATA:  Pancreatic carcinoma, post distal pancreatectomy and splenectomy with persistent leak. Surgical drain remains in place into pigtail catheter had been placed into a retroperitoneal collection ; output has tapered off.  EXAM: RETROPERITONEAL DRAIN CATHETER INJECTION UNDER FLUOROSCOPY  TECHNIQUE: The procedure, risks (including but not limited to bleeding, infection, organ damage ), benefits, and alternatives were explained to the patient. Questions regarding the procedure were encouraged and answered. The patient understands and consents to the procedure.  Survey fluoroscopic inspection reveals stable  position of pigtail catheter in the left upper abdomen and surgical drain to the subphrenic space. A small left pleural effusion is noted.  Injection of the left upper quadrant percutaneous pigtail drain catheter demonstrates filling of a small irregular cavity around the drain catheter. With continued injection, contrast tracks back along the catheter and exits the skin entry site. There is no opacification of the pancreatic duct or biliary tree. With aspiration, most of the injected contrast can be removed.  After telephone consultation with Dr. BBarry Dienes the decision was made to proceed with injection of the left lateral surgical drain catheter. However, the catheter is occluded such that injection using a moderately vigorous pressure did not result in any contrast entering the drain catheter. Inspection demonstrates that the retention sutures is not occlusive. High pressure injection was deferred. Both catheters were returned to bulb drainage.  The patient tolerated the procedure well. No immediate complication.  IMPRESSION: 1. Small residual cavity around the pigtail drain catheter, with NO communication to the pancreatic duct or biliary tree opacified. 2. Occlusion of surgical drain catheter.   Electronically Signed   By: DLucrezia EuropeM.D.   On: 05/17/2014 15:03    Medications / Allergies: per chart  Antibiotics: Anti-infectives    Start     Dose/Rate Route Frequency Ordered Stop   05/16/14 2000  ciprofloxacin (CIPRO) tablet 500 mg     500 mg Oral 2 times daily 05/16/14 1624     05/16/14 1700  metroNIDAZOLE (FLAGYL) tablet 500 mg     500 mg Oral 3 times per day 05/16/14 1607     05/10/14 2000  ciprofloxacin (CIPRO) IVPB 400 mg  Status:  Discontinued     400 mg 200 mL/hr over 60 Minutes Intravenous Every 12 hours 05/10/14 1758 05/16/14 1624   05/10/14 1800  piperacillin-tazobactam (ZOSYN) IVPB 3.375 g  Status:  Discontinued     3.375 g 12.5 mL/hr over 240 Minutes Intravenous Every 8 hours 05/10/14  1743 05/16/14 1607   05/10/14 1100  doxycycline (VIBRA-TABS) tablet 100 mg  Status:  Discontinued     100 mg Oral 2 times daily 05/10/14 1008 05/11/14 2251

## 2014-05-20 DIAGNOSIS — I10 Essential (primary) hypertension: Secondary | ICD-10-CM

## 2014-05-20 DIAGNOSIS — R000 Tachycardia, unspecified: Secondary | ICD-10-CM

## 2014-05-20 MED ORDER — TIMOLOL MALEATE 0.5 % OP SOLN
1.0000 [drp] | Freq: Two times a day (BID) | OPHTHALMIC | Status: DC
  Administered 2014-05-20 – 2014-05-21 (×3): 1 [drp] via OPHTHALMIC
  Filled 2014-05-20: qty 5

## 2014-05-20 MED ORDER — BRIMONIDINE TARTRATE 0.2 % OP SOLN
1.0000 [drp] | Freq: Two times a day (BID) | OPHTHALMIC | Status: DC
  Administered 2014-05-20 – 2014-05-21 (×3): 1 [drp] via OPHTHALMIC
  Filled 2014-05-20: qty 5

## 2014-05-20 NOTE — Progress Notes (Signed)
Patient ID: [name], female   DOB: 1945-04-01, 69 y.o.   MRN: [id number]  [facility] PROGRESS NOTE  RONAE NOELL YQM:[id number] DOB: December 28, 1945 DOA: 05/09/2014 PCP: Mauricio Po, FNP   Brief narrative:    69 y.o. 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 She has followed with Dr Burr Medico with plan is to start chemo soon. Pt presented with left sided chest pain and upper abd pain and was found to have moderate sized pleural effusion requiring IR guided left thoracentesis 4/11 with removal of 500 cc of fluid.   Patient's hospital course has been complicated by persistent loculated pleural effusion possibly causing persistent tachycardia and requiring consultation by cardiothoracic and cardiology team, respectively.   Major events since admission: 4/15 - CXR with worsening pleural effusion, IR re consulted for consideration of repeat thoracentesis, only 11 cc able to remove due to significant loculation of the fluid  4/16 - pt reports feeling better, pleural fluid analysis with no growth 4/17 - d/c with Dr. Tommy Medal, no need for double Pseudomonas coverage, rec's to d/c Zosyn, continue Cipro and add Flagyl  4/18 - pt reports not voiding on her own and aware may need longer term foley cath but hesitant to have it placed today  4/19 - cardiothoracic surgery consulted for further recommendation on pleural effusion, cardiology consulted for persistent tachycardia, also WBC up from 7.9 --> 11.2 and CT chest requested per Dr. Barry Dienes   Subjective: Patient continues to feel well. Denies any further nausea or vomiting. Pain is reasonably well controlled.  Assessment/Plan:    Left chest/ abdominal  pain - Likely secondary to pleural effusion - status post left thoracentesis 4/11 with 500 cc fluid removed, exudate with no bact growth to date  - d/w ID on call, no need for double coverage for Pseudomonas - recommendation was to d/c Zosyn and continue Cipro with addition of Flagyl due to risk of C.Diff (changes made 4/17) - repeat CXR 4/16 notable for worsening pleural effusion, repeat thoracentesis 4/16 with only 10 cc of fluid able to remove  - Dr. Barry Dienes consulted cardiothoracic surgery team for further assistance on pleural effusion. Thoracic surgery did not recommend any invasive procedures. A drainage tube would be one option. Considering that the patient is stable and the fluid collection has decreased in size, would follow this conservatively.  Left pleural effusion - possibly related to extension of the abd abscess - still fairly diminished breath sounds on the left side, s/p thoracentesis 4/11 (500 cc removed) and again 4/16 (only 10 cc removed on 4/16) - CXR 4/16 with worsening effusion but respiratory status remains stable - pleural fluid with no bacterial growth to date - CT chest continues to show left-sided pleural effusion. The loculated collection appears to be decreased in size.  LUQ abscess with recent cultures growing Pseudomonas - status post drainage on 3/18 with cultures positive for Pseudomonas  - WBC continued trending down - repeat CT abd 4/12 with decreasing size of the abscess  - on 4/17 stopped Zosyn and continued Cipro, added flagyl per ID Dr. Tommy Medal recommendations  - pt so far tolerating well. Per surgery Drains to be left in place at the time of discharge.  Slight volume overload -  weight since admission from 190 to 195 lbs on 4/12 --> 203 lbs --> 193 lbs --> 191 lbs --> 189 lbs - stopped IVF 4/13 and gave low dose lasix 20 mg IV x 1 dose (4/13), gave additional dose of Lasix  4/14 and 4/15 - has been off Lasix since 4/16 and weight continues trending  down  - monitor weight and strict I's and O's  Persistent tachycardia - Heart rate is better - cardiology team is following - Continue Cardizem - Echocardiogram is pending   Hypokalemia - supplemented and WNL   Prior DVT s/p IVC filter - continue Lovenox  per pharmacy   Acute renal failure - Was pre renal  - Cr now remains WNL   Anemia of acute illness imposed on anemia of chronic disease, malignancy - no signs of active bleeding, Hg remains stable  - no indication for transfusion at this time   Sepsis secondary to the above, L pleural effusion, ? parapneumonic - Now stable.  - continue ABX as noted above  Pancreatic cancer of tail, s/p distal pancreatectomy, splenectomy, & partial colectomy 04/08/2014 - Management per general surgery.   Severe PCM - in the context of acute illness - nutritionist consulted, pt tolerating diet well but still poor oral intake   Urinary retention - despite multiple voiding trails after surgery, she continued to retain urine. This required the placement of Foley catheter. - Leave Foley in for now. Can try another voiding trial once she is more mobile in the rehabilitation center. If she continues to retain urine despite increased mobility she may need follow-up with a urologist.  Tremors   - on Primidone and stable at this time   Acute functional quadriplegia - in the setting of acute illness - still very weak but trying to participate in PT. will benefit from short-term rehabilitation.  DVT prophylaxis:  on Lovenox full dose  Code Status: Full.  Family Communication:  plan of care discussed with the patient Disposition Plan: Await echocardiogram today. Appears to be tolerating her diet better than yesterday. Hopefully discharge to rehabilitation tomorrow.   IV access:  Peripheral IV  Procedures and diagnostic studies:    Ct Abdomen Pelvis Wo Contrast  05/11/2014   Two left upper quadrant surgical drains remain in place and unchanged  in position. No significant fluid collection within the left upper quadrant.  Slight interval decrease in size of a fluid collection over the surgical bed adjacent the distal pancreatectomy measuring 2.1 x 2.7 cm (previously 2.7 x 3 cm).  Interval improvement small amount of free pelvic fluid.  Stable stranding of the mesenteric fat adjacent the colonic anastomosis in the left mid abdomen likely postsurgical change.  Other stable chronic changes within the abdomen.  Continued moderate left pleural effusion with associated compressive atelectasis. Tiny right pleural fluid collection with associated atelectasis.      Dg Chest 1 View   05/10/2014   Left effusion smaller following thoracentesis. There is a small left effusion with left base atelectasis. Subpulmonic chest tube remains on the left as does a drain in the upper abdomen on the left. Right lung is clear. No change in cardiac silhouette. No demonstrable pneumothorax.     Dg Chest 2 View  05/09/2014   Progressed left pleural effusion, now moderate to large. Associated decreased ventilation in the left lung. 2. Lower lung volumes.     Ct Angio Chest Pe W/cm &/or Wo Cm  05/09/2014   No evidence of pulmonary embolus.  Moderate  left pleural effusion is noted with associated atelectasis of the left lower lobe.  Continued presence of drainage catheter seen between the stomach and left kidney, as well as continued and stable appearance of fluid collection medial to proximal stomach.   Dg Abd Acute W/chest  05/08/2014  Nonobstructive bowel gas pattern. Left pleural effusion  Similar position of left upper quadrant surgical drains.    US Thoracentesis Asp Pleural Space W/img Guide 05/10/2014  Successful ultrasound-guided left sided thoracentesis yielding 500 ml of pleural fluid.    Medical Consultants:  PCCM - has signed off on 4/16 IR Surgery  Cardiology  Cardiothoracic surgery   Other Consultants:  PT/OT  IAnti-Infectives:   Zosyn 4/10 --> 4/17   Cipro 4/10 --> Flagyl 4/17 -->  Bonnielee Haff, MD  [facility] Pager [phone]  If 7PM-7AM, please contact night-coverage [email or web address] Password [email or web address] 05/20/2014, 9:38 AM   LOS: 11 days    Objective: Filed Vitals:   05/19/14 0638 05/19/14 1528 05/19/14 2329 05/20/14 0657  BP: 113/58 106/55 122/74 124/70  Pulse: 103 129 108 104  Temp: 98.1 F (36.7 C) 98.3 F (36.8 C) 97.9 F (36.6 C) 98.1 F (36.7 C)  TempSrc: Oral Oral Oral Oral  Resp: 20 20 18 20   Height:      Weight:    86.138 kg (189 lb 14.4 oz)  SpO2: 99% 98% 100% 98%    Intake/Output Summary (Last 24 hours) at 05/20/14 0938 Last data filed at 05/20/14 0800  Gross per 24 hour  Intake    860 ml  Output    716 ml  Net    144 ml    Exam:   General:  Awake and alert. In no distress.  Cardiovascular: Regular rhythm, tachycardic, S1/S2, no rubs, no gallops  Respiratory: Diminished air entry at the bases. No definite crackles, wheezing or rhonchi.   Abdomen: Soft, non tender in epigastric area, non distended, bowel sounds present, drains in place   Extremities: pulses DP and PT palpable bilaterally  Data Reviewed: Basic Metabolic Panel:  Recent Labs Lab 05/14/14 0512 05/15/14 0846 05/16/14 0422 05/17/14 0417 05/18/14 0532 05/19/14 0434  NA 137  --  135 133* 135 138  K 3.2*  --  3.2* 3.0* 4.3 3.7  CL 103  --  101 99 104 107  CO2 24  --  26 24 23 23   GLUCOSE 104*  --  107* 94 113* 91  BUN 8  --  8 6 8 8   CREATININE 0.99  --  1.05 0.79 0.89 0.85  CALCIUM 8.4  --  8.4 8.3* 8.7 8.4  MG  --  1.3* 1.9 1.8 1.7 1.6   Liver Function Tests:  Recent Labs Lab 05/16/14 0422  AST 36  ALT 16  ALKPHOS 88  BILITOT 0.3  PROT 6.8  ALBUMIN 2.8*    Recent Labs Lab 05/16/14 0422  AMYLASE 48   CBC:  Recent Labs Lab 05/14/14 0512 05/16/14 0422 05/17/14 0417 05/18/14 0532 05/19/14 0434  WBC 7.3 9.3 7.9 11.2* 9.6  HGB 8.8* 8.7* 8.7* 9.4* 8.7*  HCT 28.7* 28.3* 27.5* 30.6* 28.3*  MCV 94.7 93.7 93.2  93.9 94.3  PLT 431* 410* 420* 536* 528*    Recent Results (from the past 240 hour(s))  MRSA PCR Screening     Status: None   Collection Time: 05/09/14 12:27 PM  Result Value Ref Range Status   MRSA by PCR NEGATIVE NEGATIVE Final  Body fluid culture     Status: None (Preliminary result)  Collection Time: 05/10/14  2:01 PM  Result Value Ref Range Status   Specimen Description PLEURAL  Final   Special Requests NONE  Final   Gram Stain   Final    FEW WBC PRESENT,BOTH PMN AND MONONUCLEAR NO ORGANISMS SEEN    Culture   Final    NO GROWTH 1 DAY Performed at [facility]    Report Status PENDING  Incomplete     Scheduled Meds: . acetaminophen  500 mg Oral TID WC & HS  . ciprofloxacin  500 mg Oral BID  . diltiazem  30 mg Oral 4 times per day  . enoxaparin (LOVENOX) injection  1.5 mg/kg Subcutaneous Q24H  . latanoprost  1 drop Both Eyes QHS  . lip balm  1 application Topical BID  . metroNIDAZOLE  500 mg Oral 3 times per day  . pantoprazole  40 mg Oral BID AC  . primidone  50 mg Oral QHS  . psyllium  1 packet Oral Daily  . saccharomyces boulardii  250 mg Oral BID  . sodium chloride  3 mL Intravenous Q12H  . sucralfate  1 g Oral TID WC & HS  . tamsulosin  0.4 mg Oral Daily    Continuous Infusions:    Bonnielee Haff [phone]

## 2014-05-20 NOTE — Progress Notes (Signed)
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  Mild dyspnea with exertion of getting in and out of bed.  No chest pain.  2-D echocardiogram not yet done.  CURRENT MEDS . acetaminophen  500 mg Oral TID WC & HS  . ciprofloxacin  500 mg Oral BID  . diltiazem  30 mg Oral 4 times per day  . enoxaparin (LOVENOX) injection  1.5 mg/kg Subcutaneous Q24H  . latanoprost  1 drop Both Eyes QHS  . lip balm  1 application Topical BID  . metroNIDAZOLE  500 mg Oral 3 times per day  . pantoprazole  40 mg Oral BID AC  . primidone  50 mg Oral QHS  . psyllium  1 packet Oral Daily  . saccharomyces boulardii  250 mg Oral BID  . sodium chloride  3 mL Intravenous Q12H  . sucralfate  1 g Oral TID WC & HS  . tamsulosin  0.4 mg Oral Daily    OBJECTIVE  Filed Vitals:   05/19/14 0638 05/19/14 1528 05/19/14 2329 05/20/14 0657  BP: 113/58 106/55 122/74 124/70  Pulse: 103 129 108 104  Temp: 98.1 F (36.7 C) 98.3 F (36.8 C) 97.9 F (36.6 C) 98.1 F (36.7 C)  TempSrc: Oral Oral Oral Oral  Resp: 20 20 18 20   Height:      Weight:    189 lb 14.4 oz (86.138 kg)  SpO2: 99% 98% 100% 98%    Intake/Output Summary (Last 24 hours) at 05/20/14 0800 Last data filed at 05/20/14 0705  Gross per 24 hour  Intake    740 ml  Output    716 ml  Net     24 ml   Filed Weights   05/17/14 0629 05/18/14 0520 05/20/14 0657  Weight: 187 lb 13.3 oz (85.2 kg) 189 lb 6.4 oz (85.911 kg) 189 lb 14.4 oz (86.138 kg)    PHYSICAL  EXAM  General: Pleasant, NAD. Neuro: Alert and oriented X 3. Moves all extremities spontaneously. Psych: Normal affect. HEENT:  Normal  Neck: Supple without bruits or JVD. Lungs:  Resp regular and unlabored, decreased breath sounds at left base. Heart: RRR no s3, s4, or murmurs. Abdomen: Soft, non-tender, non-distended, BS + x 4.  Extremities: No clubbing, cyanosis or edema. DP/PT/Radials 2+ and equal bilaterally.  Accessory Clinical Findings  CBC  Recent Labs  05/18/14 0532 05/19/14 0434  WBC 11.2* 9.6  HGB 9.4* 8.7*  HCT 30.6* 28.3*  MCV 93.9 94.3  PLT 536* 528*   Basic Metabolic Panel  Recent Labs  05/18/14 0532 05/19/14 0434  NA 135 138  K 4.3 3.7  CL 104 107  CO2 23 23  GLUCOSE 113* 91  BUN 8 8  CREATININE 0.89 0.85  CALCIUM 8.7 8.4  MG 1.7 1.6   Liver Function Tests No results for input(s): AST, ALT, ALKPHOS, BILITOT, PROT, ALBUMIN in the last 72 hours. No results for input(s): LIPASE, AMYLASE in the last 72 hours. Cardiac Enzymes No  results for input(s): CKTOTAL, CKMB, CKMBINDEX, TROPONINI in the last 72 hours. BNP Invalid input(s): POCBNP D-Dimer No results for input(s): DDIMER in the last 72 hours. Hemoglobin A1C No results for input(s): HGBA1C in the last 72 hours. Fasting Lipid Panel No results for input(s): CHOL, HDL, LDLCALC, TRIG, CHOLHDL, LDLDIRECT in the last 72 hours. Thyroid Function Tests  Recent Labs  05/19/14 0434  TSH 1.271    TELE  Sinus tachycardia  ECG  Sinus tachycardia  Radiology/Studies  Ct Abdomen Pelvis Wo Contrast  05/11/2014   CLINICAL DATA:  Evaluate for fluid collection. Pancreatic cancer. Previous distal pancreatectomy, splenectomy and partial colectomy.  EXAM: CT ABDOMEN AND PELVIS WITHOUT CONTRAST  TECHNIQUE: Multidetector CT imaging of the abdomen and pelvis was performed following the standard protocol without IV contrast.  COMPARISON:  04/29/2014  FINDINGS: Lung bases demonstrate a tiny amount of right  pleural fluid and associated dependent atelectasis. There is a moderate size left pleural fluid collection with associated consolidation likely compressive atelectasis. Mild stable cardiomegaly.  Abdominal images demonstrate no change in positioning of the two left upper quadrant surgical drains. There is slightly less fluid adjacent the pigtail portion of the catheter. There is a slight decrease in size of a fluid collection over the surgical suture line overlying the distal pancreas measuring 2.1 x 2.7 cm (previously 2.7 x 3 cm) with no change in a small focus of air along the anterior aspect of the fluid collection.  Previous cholecystectomy. The liver, adrenal glands and kidneys are otherwise unchanged. IVC filter unchanged. Postsurgical change over the anterior abdominal wall in the midline over the epigastric region.  Postsurgical change over the left colon as the anastomotic site is patent. There remains mild stranding of the adjacent mesenteric fat adjacent the anastomotic site. The appendix is normal. There is moderate diverticulosis throughout the colon.  Pelvic images demonstrate a Foley catheter within a decompressed bladder. Rectosigmoid colon is normal. There is a small amount of free fluid slightly improved. Degenerative changes of the spine and hips.  IMPRESSION: Two left upper quadrant surgical drains remain in place and unchanged in position. No significant fluid collection within the left upper quadrant.  Slight interval decrease in size of a fluid collection over the surgical bed adjacent the distal pancreatectomy measuring 2.1 x 2.7 cm (previously 2.7 x 3 cm).  Interval improvement small amount of free pelvic fluid.  Stable stranding of the mesenteric fat adjacent the colonic anastomosis in the left mid abdomen likely postsurgical change.  Other stable chronic changes within the abdomen.  Continued moderate left pleural effusion with associated compressive atelectasis. Tiny right pleural fluid  collection with associated atelectasis.   Electronically Signed   By: Marin Olp M.D.   On: 05/11/2014 17:02   Ct Abdomen Pelvis Wo Contrast  04/29/2014   CLINICAL DATA:  Intermittent nausea and vomiting with poor appetite and left upper quadrant soreness. History of distal pancreatectomy/ splenectomy and partial colectomy. Subsequent encounter.  EXAM: CT ABDOMEN AND PELVIS WITHOUT CONTRAST  TECHNIQUE: Multidetector CT imaging of the abdomen and pelvis was performed following the standard protocol without IV contrast.  COMPARISON:  04/24/2014.  MR abdomen 01/05/2014.  FINDINGS: Lower chest: Lung bases show a moderate simple appearing left pleural effusion with likely compressive atelectasis in the left lower lobe. Heart size within normal limits. Decreased attenuation of the intravascular compartment is indicative of anemia. No pericardial effusion.  Hepatobiliary: A lobulated low-attenuation lesion in the inferior right hepatic lobe measures 2.5 cm, stable, characterized  as a cyst on 01/05/2014. Liver is otherwise unremarkable. Cholecystectomy. Extrahepatic bile duct measures 1.4 cm, stable.  Pancreas: Status post distal pancreatectomy with a focal low-attenuation lesion at the surgical margin, measuring 2.7 x 3.0 cm, grossly stable in size. Pancreas is otherwise unremarkable.  Spleen: Surgically absent.  Adrenals/Urinary Tract: Adrenal glands and right kidney are unremarkable. Low-attenuation lesion in the interpolar left kidney measures 1.5 cm, stable and characterized as a cyst on 01/05/2014. Ureters are decompressed. Air in the bladder is presumably iatrogenic. Bladder wall may be slightly thickened.  Stomach/Bowel: Stomach, small bowel and proximal colon are unremarkable. An anastomosis is seen at the splenic flexure. Colon is otherwise unremarkable.  Vascular/Lymphatic: Atherosclerotic calcification of the arterial vasculature without abdominal aortic aneurysm. No pathologically enlarged lymph nodes.  IVC filter is in place.  Reproductive: Hysterectomy.  Ovaries are visualized.  Other: Small pelvic free fluid. 2 percutaneous drains terminate in the left upper quadrant, along the posterior margin of the stomach and anterior margin of the left kidney. No definite associated fluid collection. Upper abdominal midline ventral hernia contains fat, small in size. Presumed subcutaneous injection densities and air in the ventral low abdomen and pelvis. Mesenteries and peritoneum are otherwise unremarkable.  Musculoskeletal: No worrisome lytic or sclerotic lesions.  IMPRESSION: 1. Postoperative changes of distal pancreatectomy with stable pseudocyst or seroma adjacent to the surgical margin. 2. Resolved left upper quadrant abscess with 2 percutaneous drains in place. 3. Small pelvic free fluid. 4. Moderate left pleural effusion with likely compressive atelectasis in the left lower lobe. Difficult to definitively exclude pneumonia.   Electronically Signed   By: Lorin Picket M.D.   On: 04/29/2014 15:25   Ct Abdomen Pelvis Wo Contrast  04/24/2014   CLINICAL DATA:  Status post splenectomy and distal pancreatectomy, post drainage of intra-abdominal abscess  EXAM: CT ABDOMEN AND PELVIS WITHOUT CONTRAST  TECHNIQUE: Multidetector CT imaging of the abdomen and pelvis was performed following the standard protocol without IV contrast.  COMPARISON:  04/15/2014 and 04/16/2014  FINDINGS: Sagittal images of the spine shows diffuse osteopenia. Mild degenerative changes lumbar spine. There is moderate left pleural effusion with left lower lobe atelectasis.  Again noted status post cholecystectomy. Unenhanced liver is unremarkable. The patient is status post splenectomy and distal pancreatectomy. A percutaneous drainage catheter is noted in left flank with tip in left upper quadrant anterior to upper pole of the left kidney. There is significant improvement from prior exam. The previous subdiaphragmatic fluid has resolved. The  collection anterior to left kidney has resolved. Stable postsurgical changes at the site of distal pancreatectomy. Small residual seroma at this level measures 2.8 cm stable in size in appearance from prior exam. A cyst in midpole posterior aspect of the left kidney is stable. Postsurgical drain in left lower abdomen is stable. No new low abscess is noted in left abdomen. Only tiny amount of residual fluid/air is noted just anterior to the surgical drain in left upper abdomen axial image 14 measures about 2.4 cm . IVC filter in place again noted. There is no small bowel obstruction. No ascites or free air. Normal appendix partially visualized. Mild anasarca infiltration subcutaneous fat bilateral flank wall. Again noted subcutaneous round density consistent with subcutaneous injections lower anterior abdominal wall.  No nephrolithiasis. No hydronephrosis or hydroureter. No distal colonic obstruction. There is a Foley catheter within a decompressed urinary bladder. Air is noted within anterior aspect of the bladder.  IMPRESSION: 1. The patient is status post splenectomy and distal pancreatectomy. A  percutaneous drainage catheter is noted in left flank with tip in left upper quadrant anterior to upper pole of the left kidney. There is significant improvement from prior exam. The previous subdiaphragmatic fluid has resolved. The collection anterior to left kidney has resolved. 2. There is moderate left pleural effusion with left lower lobe atelectasis. 3. No new abscess collection is noted. Only tiny amount of residual fluid/air is noted just anterior to the surgical drain in left upper abdomen axial image 14 measures about 2.4 cm 4. Stable postsurgical changes at the site of distal pancreatectomy. 5. IVC filter in place again noted. 6. No small bowel or colonic obstruction. 7. There is Foley catheter in decompressed urinary bladder. Moderate air is noted anterior aspect of the bladder probable post instrumentation.    Electronically Signed   By: Lahoma Crocker M.D.   On: 04/24/2014 18:43   Dg Chest 1 View  05/14/2014   CLINICAL DATA:  Post attempted left-sided thoracentesis  EXAM: CHEST  1 VIEW  COMPARISON:  Earlier same day ; 05/13/2014; 05/10/2014; CT abdomen pelvis - 05/11/2014  FINDINGS: Grossly unchanged borderline enlarged cardiac silhouette and mediastinal contours with persistent obscuration of left heart border secondary to grossly unchanged small loculated left-sided effusion with associated left basilar consolidative opacities. No new focal airspace opacities. No pneumothorax. No evidence of edema. No acute osseus abnormalities. Percutaneous drainage catheter overlies the left upper abdominal quadrant.  IMPRESSION: 1. Unchanged small partially loculated left-sided effusion post attempted thoracentesis. No pneumothorax. 2. Grossly unchanged consolidative opacities within the left lower lung.   Electronically Signed   By: Sandi Mariscal M.D.   On: 05/14/2014 15:45   Dg Chest 1 View  05/10/2014   CLINICAL DATA:  Status post thoracentesis  EXAM: CHEST  1 VIEW  COMPARISON:  Chest radiograph and chest CT May 09, 2014  FINDINGS: No pneumothorax. Left effusion smaller following thoracentesis. There is some residual left effusion with left base atelectasis. There is a chest tube in the left subpleural region as well as a pigtail catheter in the left upper abdomen.  Right lung is clear. Heart is mildly enlarged with pulmonary vascularity, stable.  IMPRESSION: Left effusion smaller following thoracentesis. There is a small left effusion with left base atelectasis. Subpulmonic chest tube remains on the left as does a drain in the upper abdomen on the left. Right lung is clear. No change in cardiac silhouette. No demonstrable pneumothorax.   Electronically Signed   By: Lowella Grip III M.D.   On: 05/10/2014 14:13   Dg Chest 2 View  05/14/2014   CLINICAL DATA:  Followup pleural effusion.  EXAM: CHEST  2 VIEW  COMPARISON:   Chest x-ray 05/13/2014 and CT abdomen 05/11/2014.  FINDINGS: Persistent small left pleural effusion with overlying atelectasis. The right lung remains clear. Stable left upper quadrant drainage catheter.  IMPRESSION: Persistent left pleural effusion with overlying atelectasis.   Electronically Signed   By: Marijo Sanes M.D.   On: 05/14/2014 14:10   Dg Chest 2 View  05/13/2014   CLINICAL DATA:  Pancreatic cancer, shortness of breath, pleural effusions, weakness  EXAM: CHEST  2 VIEW  COMPARISON:  05/10/2014  FINDINGS: Pigtail drainage catheter LEFT upper quadrant.  Enlargement of cardiac silhouette.  Atherosclerotic calcification aorta.  Mediastinal contours and pulmonary vascularity otherwise normal.  Increased LEFT pleural effusion and basilar atelectasis since previous exam.  Remaining lungs clear.  No pneumothorax.  Bones demineralized.  IMPRESSION: Increased LEFT pleural effusion and basilar atelectasis.  Enlargement of  cardiac silhouette.   Electronically Signed   By: Lavonia Dana M.D.   On: 05/13/2014 14:11   Dg Chest 2 View  05/09/2014   CLINICAL DATA:  69 year old female with shortness of breath and chest pain. Initial encounter.  Personal history of distal pancreatectomy and splenectomy.  EXAM: CHEST  2 VIEW  COMPARISON:  Chest and abdominal series 4 /09/2014. CT Abdomen and Pelvis 04/29/2014, portable chest 04/17/2014.  FINDINGS: Semi upright AP and lateral views of the chest. Left pleural effusion has progressed and is moderate to large. Increased opacification at the left lung base. Lower lung volumes.  Stable cardiac size and mediastinal contours. No pneumothorax or pulmonary edema. Grossly clear right lung. Visualized tracheal air column is within normal limits.  Left abdomen percutaneous drains remain in place. Partially visible IVC filter.  IMPRESSION: 1. Progressed left pleural effusion, now moderate to large. Associated decreased ventilation in the left lung. 2. Lower lung volumes.    Electronically Signed   By: Genevie Ann M.D.   On: 05/09/2014 08:49   Ct Chest W Contrast  05/18/2014   CLINICAL DATA:  Loculated left pleural effusion, chest pain.  EXAM: CT CHEST WITH CONTRAST  TECHNIQUE: Multidetector CT imaging of the chest was performed during intravenous contrast administration.  CONTRAST:  34mL OMNIPAQUE IOHEXOL 300 MG/ML  SOLN  COMPARISON:  CT scan of May 09, 2014.  FINDINGS: No pneumothorax is noted. Mild to moderate left pleural effusion is noted with associated atelectasis of the left lower lobe. The superior portion of this effusion appears to be loculated. There is no evidence of thoracic aortic dissection or aneurysm. Visualized portions of pulmonary arteries appear normal. No mediastinal mass or adenopathy is noted no significant osseous abnormality is noted. Percutaneous drain is again noted between the stomach and left kidney.  IMPRESSION: Mild to moderate left pleural effusion is noted with adjacent atelectasis of the left lower lobe. The smaller superior portion of this effusion appears to be loculated. This appears to be smaller compared to prior exam.   Electronically Signed   By: Marijo Conception, M.D.   On: 05/18/2014 12:13   Ct Angio Chest Pe W/cm &/or Wo Cm  05/09/2014   CLINICAL DATA:  Shortness of breath, chest pain.  EXAM: CT ANGIOGRAPHY CHEST WITH CONTRAST  TECHNIQUE: Multidetector CT imaging of the chest was performed using the standard protocol during bolus administration of intravenous contrast. Multiplanar CT image reconstructions and MIPs were obtained to evaluate the vascular anatomy.  CONTRAST:  138mL OMNIPAQUE IOHEXOL 350 MG/ML SOLN  COMPARISON:  CT scan of April 29, 2014.  FINDINGS: No pneumothorax is noted. Moderate left pleural effusion is noted with associated atelectasis of the left lower lobe. Right lung is clear. There is no evidence of thoracic aortic dissection or aneurysm. There is no definite evidence of pulmonary embolus. No mediastinal mass or  adenopathy is noted. No significant osseous abnormality is noted. Continued presence of drainage catheter seen between the stomach and left kidney. Continued presence of fluid collection seen medial to proximal stomach.  Review of the MIP images confirms the above findings.  IMPRESSION: No evidence of pulmonary embolus.  Moderate left pleural effusion is noted with associated atelectasis of the left lower lobe.  Continued presence of drainage catheter seen between the stomach and left kidney, as well as continued and stable appearance of fluid collection medial to proximal stomach. Please refer to report of CT scan of April 29, 2014.   Electronically Signed   By:  Marijo Conception, M.D.   On: 05/09/2014 16:56   Ir Sinus/fist Tube Chk-non Gi  05/17/2014   CLINICAL DATA:  Pancreatic carcinoma, post distal pancreatectomy and splenectomy with persistent leak. Surgical drain remains in place into pigtail catheter had been placed into a retroperitoneal collection ; output has tapered off.  EXAM: RETROPERITONEAL DRAIN CATHETER INJECTION UNDER FLUOROSCOPY  TECHNIQUE: The procedure, risks (including but not limited to bleeding, infection, organ damage ), benefits, and alternatives were explained to the patient. Questions regarding the procedure were encouraged and answered. The patient understands and consents to the procedure.  Survey fluoroscopic inspection reveals stable position of pigtail catheter in the left upper abdomen and surgical drain to the subphrenic space. A small left pleural effusion is noted.  Injection of the left upper quadrant percutaneous pigtail drain catheter demonstrates filling of a small irregular cavity around the drain catheter. With continued injection, contrast tracks back along the catheter and exits the skin entry site. There is no opacification of the pancreatic duct or biliary tree. With aspiration, most of the injected contrast can be removed.  After telephone consultation with Dr.  Barry Dienes, the decision was made to proceed with injection of the left lateral surgical drain catheter. However, the catheter is occluded such that injection using a moderately vigorous pressure did not result in any contrast entering the drain catheter. Inspection demonstrates that the retention sutures is not occlusive. High pressure injection was deferred. Both catheters were returned to bulb drainage.  The patient tolerated the procedure well. No immediate complication.  IMPRESSION: 1. Small residual cavity around the pigtail drain catheter, with NO communication to the pancreatic duct or biliary tree opacified. 2. Occlusion of surgical drain catheter.   Electronically Signed   By: Lucrezia Europe M.D.   On: 05/17/2014 15:03   US Renal  04/20/2014   CLINICAL DATA:  Acute renal failure  EXAM: RENAL/URINARY TRACT ULTRASOUND COMPLETE  COMPARISON:  04/15/2014  FINDINGS: Right Kidney:  Length: 11.7 cm. Echogenicity within normal limits. No mass or hydronephrosis visualized.  Left Kidney:  Length: 12.0 cm. A 2.4 cm cyst is noted in the midportion of the left kidney posteriorly. This corresponds to that seen on recent CT examination.  Bladder:  Decompressed by Foley catheter.  IMPRESSION: Left renal cyst.   Electronically Signed   By: Inez Catalina M.D.   On: 04/20/2014 11:09   Dg Abd Acute W/chest  05/08/2014   CLINICAL DATA:  Left upper quadrant and left lower quadrant abdominal pain.  EXAM: DG ABDOMEN ACUTE W/ 1V CHEST  COMPARISON:  04/29/2014  FINDINGS: Two surgical drains are identified within the left upper quadrant of the abdomen. These are similar in position to 3/30 1/16. There is an IVC filter in place and there are surgical clips from prior cholecystectomy. There is no evidence of dilated bowel loops or free intraperitoneal air. No radiopaque calculi or other significant radiographic abnormality is seen. Heart size and mediastinal contours are within normal limits. Both lungs are clear. Moderate left pleural  effusion is again noted.  IMPRESSION: 1. Nonobstructive bowel gas pattern. 2. Left pleural effusion 3. Similar position of left upper quadrant surgical drains.   Electronically Signed   By: Kerby Moors M.D.   On: 05/08/2014 18:27   US Thoracentesis Asp Pleural Space W/img Guide  05/14/2014   INDICATION: Symptomatic left sided pleural effusion  EXAM: US THORACENTESIS ASP PLEURAL SPACE W/IMG GUIDE  COMPARISON:  Chest radiograph - earlier same day; 05/13/2014; 05/10/2014; CT abdomen  pelvis - 05/11/2014  MEDICATIONS: None  COMPLICATIONS: None immediate  TECHNIQUE: Informed written consent was obtained from the patient after a discussion of the risks, benefits and alternatives to treatment. A timeout was performed prior to the initiation of the procedure.  Initial ultrasound scanning demonstrates a small pleural effusion with multiple internal echogenic septations and apparent tethering of the left lower lobe (representative images 1 through 4). The lower chest was prepped and draped in the usual sterile fashion. 1% lidocaine was used for local anesthesia.  A 5 [place] Yueh sheath needle was advanced into an anechoic component left-sided pleural effusion however only yielded approximately 2 cc of fluid. Yueh sheath needle was then advanced into a separate anechoic component of the left-sided pleural effusion however only approximately 8 cc of fluid was able to be aspirated. Multiple ultrasound images were saved for documentation purposes.  The catheter was removed and a dressing was applied. The patient tolerated the procedure well without immediate post procedural complication. The patient was escorted to have an upright chest radiograph.  FINDINGS: A total of approximately 10 cc of blood tinged serous fluid was removed.  IMPRESSION: Successful ultrasound-guided left sided thoracentesis yielding only 10 cc liters of pleural fluid secondary to marked loculation of the effusion.   Electronically Signed   By: Sandi Mariscal M.D.   On: 05/14/2014 16:06   US Thoracentesis Asp Pleural Space W/img Guide  05/10/2014   INDICATION: Symptomatic left sided pleural effusion  EXAM: US THORACENTESIS ASP PLEURAL SPACE W/IMG GUIDE  COMPARISON:  CXR 05/09/14.  MEDICATIONS: None  COMPLICATIONS: None immediate  TECHNIQUE: Informed written consent was obtained from the patient after a discussion of the risks, benefits and alternatives to treatment. A timeout was performed prior to the initiation of the procedure.  Initial ultrasound scanning demonstrates a left pleural effusion. The lower chest was prepped and draped in the usual sterile fashion. 1% lidocaine was used for local anesthesia.  Under direct ultrasound guidance, a 19 gauge, 10-cm, Yueh catheter was introduced. An ultrasound image was saved for documentation purposes. The thoracentesis was performed. The catheter was removed and a dressing was applied. The patient tolerated the procedure well without immediate post procedural complication. The patient was escorted to have an upright chest radiograph.  FINDINGS: A total of approximately 500 ml of blood tinged fluid was removed. Requested samples were sent to the laboratory.  IMPRESSION: Successful ultrasound-guided left sided thoracentesis yielding 500 ml of pleural fluid.  Read By:  Tsosie Billing PA-C   Electronically Signed   By: Sandi Mariscal M.D.   On: 05/10/2014 14:14    ASSESSMENT AND PLAN 1.  Sinus tachycardia 2.  Recent fevers 3.  History of pancreatic cancer and she is status post distal pancreatectomy, splenectomy, and partial colectomy on 04/08/14 4.  Left pleural effusion  Plan: Continue diltiazem for now.  Await results of echocardiogram.  Consider addition of low-dose beta blocker depending on results of echo  Signed, Darlin Coco MD

## 2014-05-20 NOTE — Progress Notes (Signed)
Patient ID: [name], female   DOB: 1945-03-12, 69 y.o.   MRN: [id number]    Referring Physician(s): CCS  Subjective:  Pt sitting up in chair; no acute changes; anticipating d/c tomorrow  Allergies: Sulfa antibiotics  Medications: Prior to Admission medications   Medication Sig Start Date End Date Taking? Authorizing Provider  atorvastatin (LIPITOR) 40 MG tablet Take 1 tablet (40 mg total) by mouth daily. 05/05/14  Yes Gerlene Fee, NP  brimonidine-timolol (COMBIGAN) 0.2-0.5 % ophthalmic solution Place 1 drop into the left eye every 12 (twelve) hours.   Yes Historical Provider, MD  diltiazem (CARTIA XT) 120 MG 24 hr capsule Take 1 capsule (120 mg total) by mouth daily. 05/05/14  Yes Gerlene Fee, NP  DOXYCYCLINE HYCLATE PO Take 100 mg by mouth 2 (two) times daily.   Yes Historical Provider, MD  enoxaparin (LOVENOX) 120 MG/0.8ML injection Inject 0.8 mLs (120 mg total) into the skin daily. 05/06/14  Yes Truitt Merle, MD  latanoprost (XALATAN) 0.005 % ophthalmic solution Place 1 drop into both eyes at bedtime.   Yes Historical Provider, MD  loperamide (IMODIUM) 2 MG capsule Take 1 capsule (2 mg total) by mouth 3 (three) times daily before meals. 05/04/14  Yes [name], MD  pantoprazole (PROTONIX) 40 MG tablet TAKE 1 TABLET BY MOUTH DAILY 03/28/14  Yes Leandrew Koyanagi, MD  primidone (MYSOLINE) 50 MG tablet Take 50 mg by mouth at bedtime. Patient takes at nite   Yes Historical Provider, MD  propranolol ER (INDERAL LA) 120 MG 24 hr capsule TAKE ONE CAPSULE BY MOUTH DAILY 03/28/14  Yes Mancel Bale, PA-C  sucralfate (CARAFATE) 1 GM/10ML suspension Take 10 mLs (1 g total) by mouth 4 (four) times daily -  with meals and at bedtime. 05/04/14  Yes [name], MD  tamsulosin (FLOMAX) 0.4 MG CAPS capsule Take 1 capsule (0.4 mg total) by mouth daily. 05/04/14  Yes [name], MD  traMADol (ULTRAM) 50 MG tablet Take 1 tablet (50 mg total) by mouth every 6 (six) hours as needed. 05/04/14  Yes [name], MD     Vital Signs: BP 97/52 mmHg  Pulse 98  Temp(Src) 98 F (36.7 C) (Oral)  Resp 20  Ht 5' 9.5" (1.765 m)  Wt 189 lb 14.4 oz (86.138 kg)  BMI 27.65 kg/m2  SpO2 99%  Physical Exam LUQ drain intact, insertion site ok, minimal tenderness, output 15 cc's turbid, green colored fluid; drain irrigated with 5 cc's sterile NS with return of same amt  Imaging: Ct Chest W Contrast  05/18/2014   CLINICAL DATA:  Loculated left pleural effusion, chest pain.  EXAM: CT CHEST WITH CONTRAST  TECHNIQUE: Multidetector CT imaging of the chest was performed during intravenous contrast administration.  CONTRAST:  28mL OMNIPAQUE IOHEXOL 300 MG/ML  SOLN  COMPARISON:  CT scan of May 09, 2014.  FINDINGS: No pneumothorax is noted. Mild to moderate left pleural effusion is noted with associated atelectasis of the left lower lobe. The superior portion of this effusion appears to be loculated. There is no evidence of thoracic aortic dissection or aneurysm. Visualized portions of pulmonary arteries appear normal. No mediastinal mass or adenopathy is noted no significant osseous abnormality is noted. Percutaneous drain is again noted between the stomach and left kidney.  IMPRESSION: Mild to moderate left pleural effusion is noted with adjacent atelectasis of the left lower lobe. The smaller superior portion of this effusion appears to be loculated. This appears to be smaller compared  to prior exam.   Electronically Signed   By: Marijo Conception, M.D.   On: 05/18/2014 12:13   Ir Sinus/fist Tube Chk-non Gi  05/17/2014   CLINICAL DATA:  Pancreatic carcinoma, post distal pancreatectomy and splenectomy with persistent leak. Surgical drain remains in place into pigtail catheter had been placed into a retroperitoneal collection ; output has tapered off.  EXAM: RETROPERITONEAL DRAIN CATHETER INJECTION UNDER FLUOROSCOPY  TECHNIQUE: The procedure, risks (including but not limited to bleeding, infection, organ damage ),  benefits, and alternatives were explained to the patient. Questions regarding the procedure were encouraged and answered. The patient understands and consents to the procedure.  Survey fluoroscopic inspection reveals stable position of pigtail catheter in the left upper abdomen and surgical drain to the subphrenic space. A small left pleural effusion is noted.  Injection of the left upper quadrant percutaneous pigtail drain catheter demonstrates filling of a small irregular cavity around the drain catheter. With continued injection, contrast tracks back along the catheter and exits the skin entry site. There is no opacification of the pancreatic duct or biliary tree. With aspiration, most of the injected contrast can be removed.  After telephone consultation with Dr. Barry Dienes, the decision was made to proceed with injection of the left lateral surgical drain catheter. However, the catheter is occluded such that injection using a moderately vigorous pressure did not result in any contrast entering the drain catheter. Inspection demonstrates that the retention sutures is not occlusive. High pressure injection was deferred. Both catheters were returned to bulb drainage.  The patient tolerated the procedure well. No immediate complication.  IMPRESSION: 1. Small residual cavity around the pigtail drain catheter, with NO communication to the pancreatic duct or biliary tree opacified. 2. Occlusion of surgical drain catheter.   Electronically Signed   By: Lucrezia Europe M.D.   On: 05/17/2014 15:03    Labs:  CBC:  Recent Labs  05/16/14 0422 05/17/14 0417 05/18/14 0532 05/19/14 0434  WBC 9.3 7.9 11.2* 9.6  HGB 8.7* 8.7* 9.4* 8.7*  HCT 28.3* 27.5* 30.6* 28.3*  PLT 410* 420* 536* 528*    COAGS:  Recent Labs  04/01/14 1400 04/08/14 0630 04/09/14 0404 05/09/14 0655  INR 1.11 1.06 1.12 1.08  APTT  --  40* 33  --     BMP:  Recent Labs  05/16/14 0422 05/17/14 0417 05/18/14 0532 05/19/14 0434  NA 135  133* 135 138  K 3.2* 3.0* 4.3 3.7  CL 101 99 104 107  CO2 26 24 23 23   GLUCOSE 107* 94 113* 91  BUN 8 6 8 8   CALCIUM 8.4 8.3* 8.7 8.4  CREATININE 1.05 0.79 0.89 0.85  GFRNONAA 54* 84* 66* 69*  GFRAA 62* >90 76* 80*    LIVER FUNCTION TESTS:  Recent Labs  04/24/14 2039  04/30/14 0450  05/03/14 0440 05/04/14 0415 05/09/14 0655 05/10/14 0350 05/16/14 0422  BILITOT 0.5  --  0.3  --   --   --  0.5  --  0.3  AST 30  --  29  --   --   --  38*  --  36  ALT 13  --  10  --   --   --  17  --  16  ALKPHOS 83  --  65  --   --   --  78  --  88  PROT 6.1  --  6.0  --   --   --  7.6 6.7 6.8  ALBUMIN 2.4*  < > 3.0*  < > 3.1* 3.2* 3.8  --  2.8*  < > = values in this interval not displayed.  Assessment and Plan: S/p LUQ abscess drainage 3/18 (post distal pancreatectomy/splenectomy/partial colectomy 04/08/14 for panc ca); afebrile;  CCS to determine whether drain can be removed on 4/22; if pt keeps drain rec once daily irrigation with 5-10 cc's sterile NS, recording of output and daily dressing changes; if CCS desires can arrange for f/u in drain clinic [phone]) next week   Signed: Autumn Messing 05/20/2014, 5:17 PM   I spent a total of 15 minutes in face to face in clinical consultation/evaluation, greater than 50% of which was counseling/coordinating care for LUQ abscess drain

## 2014-05-20 NOTE — Progress Notes (Signed)
  Echocardiogram 2D Echocardiogram has been performed.  [name] [name] 05/20/2014, 12:38 PM

## 2014-05-20 NOTE — Plan of Care (Signed)
Problem: Phase III Progression Outcomes Goal: Foley discontinued Outcome: Adequate for Discharge Will d/c with Foley

## 2014-05-20 NOTE — Progress Notes (Signed)
[facility]  [address] [place], [place] [address] Phone: [phone] FAX: [phone]    HECTOR VENNE [id number] 06/09/1945  CARE TEAM:  PCP: Mauricio Po, [place]  Outpatient Care Team: Patient Care Team: Golden Circle, FNP as PCP - General (Family Medicine) Carol Ada, MD as Consulting Physician (Gastroenterology) Truitt Merle, MD as Consulting Physician (Hematology) [name], MD as Consulting Physician (General Surgery)  Inpatient Treatment Team: Treatment Team: Attending Provider: Bonnielee Haff, MD; Rounding Team: Redmond Baseman, MD; Registered Nurse: Alfonse Spruce, RN; Consulting Physician: [name], MD; Technician: Lucila Maine, NT; Registered Nurse: Beryle Quant, RN; Technician: Mertha Baars, NT; Consulting Physician: Grace Isaac, MD; Consulting Physician: Michae Kava Lbcardiology, MD; Registered Nurse: Joellen Jersey, RN  Problem List:   Principal Problem:   Pleural effusion Active Problems:   Hx pulmonary embolism   HTN (hypertension)   DVT (deep venous thrombosis)   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  Loculated left pleural effusion and chest pain s/p drainage & Abx  Plan: Continue antibiotics and drainage. - cultures from pleural effusion negative.  3-6 weeks Abx total. Possibly remove drain 2 tomorrow if output is still 0.  Continue diet as tolerated. Nutrition key - check nutrition labs.  Urinary retention - catheter.  Follow up with urology.    Bowel regimen w probiotics.  -VTE prophylaxis- SCDs,  etc  -Mobilize as tolerated to help recovery.    Home per triad when diet/mobility stable.   Would plan to keep one or both drains at discharge.   Subjective:  Doing a bit better today and appears more lively. Passing gas and having BM. Pain controlled with meds. Able to ambulate with walker. Minimal green-tinged fluid in drains. Tolerating small meals well.  Objective:  Vital signs:  Filed Vitals:   05/18/14 2115 05/19/14 0638 05/19/14 1528 05/19/14 2329  BP: 117/64 113/58 106/55 122/74  Pulse: 123 103 129 108  Temp: 98.4 F (36.9 C) 98.1 F (36.7 C) 98.3 F (36.8 C) 97.9 F (36.6 C)  TempSrc: Oral Oral Oral Oral  Resp: 16 20 20 18   Height:      Weight:      SpO2: 97% 99% 98% 100%    Last BM Date: 05/18/14  Intake/Output   Yesterday:  04/20 0701 - 04/21 0700 In: 39 [P.O.:720] Out: 541 [Urine:525; Drains:15; Stool:1]  Bowel function:  Flatus: y  BM: y  Drain: scant w green tinge  Physical Exam:  General: Pt awake/alert/oriented x4 in no acute distress Eyes: Sclera clear.  No icterus Psych:  No delerium/psychosis/paranoia.Talkative. HENT: Normocephalic. CV: Tachycardic. Regular rhythm. Resp: Breathing non-labored. Abdomen: Soft.  Nondistended. Nontender.  No evidence of peritonitis.  No incarcerated hernias. Ext:  SCDs BLE.  No major edema.  No cyanosis Skin: No petechiae / purpura  Results:   Labs: Results for orders placed or performed during the hospital encounter of 05/09/14 (from the past 48 hour(s))  TSH     Status: None   Collection Time: 05/19/14  4:34 AM  Result Value Ref Range   TSH 1.271 0.350 -  4.500 uIU/mL  CBC     Status: Abnormal   Collection Time: 05/19/14  4:34 AM  Result Value Ref Range   WBC 9.6 4.0 - 10.5 K/uL   RBC 3.00 (L) 3.87 - 5.11 MIL/uL   Hemoglobin 8.7 (L) 12.0 - 15.0 g/dL   HCT 28.3 (L) 36.0 - 46.0 %   MCV 94.3 78.0 - 100.0 fL   MCH 29.0 26.0 - 34.0 pg   MCHC 30.7 30.0 - 36.0 g/dL   RDW 16.6 (H) 11.5 - 15.5 %    Platelets 528 (H) 150 - 400 K/uL  Basic metabolic panel     Status: Abnormal   Collection Time: 05/19/14  4:34 AM  Result Value Ref Range   Sodium 138 135 - 145 mmol/L   Potassium 3.7 3.5 - 5.1 mmol/L   Chloride 107 96 - 112 mmol/L   CO2 23 19 - 32 mmol/L   Glucose, Bld 91 70 - 99 mg/dL   BUN 8 6 - 23 mg/dL   Creatinine, Ser 0.85 0.50 - 1.10 mg/dL   Calcium 8.4 8.4 - 10.5 mg/dL   GFR calc non Af Amer 69 (L) >90 mL/min   GFR calc Af Amer 80 (L) >90 mL/min    Comment: (NOTE) The eGFR has been calculated using the CKD EPI equation. This calculation has not been validated in all clinical situations. eGFR's persistently <90 mL/min signify possible Chronic Kidney Disease.    Anion gap 8 5 - 15  Magnesium     Status: None   Collection Time: 05/19/14  4:34 AM  Result Value Ref Range   Magnesium 1.6 1.5 - 2.5 mg/dL    Imaging / Studies: Ct Chest W Contrast  05/18/2014   CLINICAL DATA:  Loculated left pleural effusion, chest pain.  EXAM: CT CHEST WITH CONTRAST  TECHNIQUE: Multidetector CT imaging of the chest was performed during intravenous contrast administration.  CONTRAST:  71m OMNIPAQUE IOHEXOL 300 MG/ML  SOLN  COMPARISON:  CT scan of May 09, 2014.  FINDINGS: No pneumothorax is noted. Mild to moderate left pleural effusion is noted with associated atelectasis of the left lower lobe. The superior portion of this effusion appears to be loculated. There is no evidence of thoracic aortic dissection or aneurysm. Visualized portions of pulmonary arteries appear normal. No mediastinal mass or adenopathy is noted no significant osseous abnormality is noted. Percutaneous drain is again noted between the stomach and left kidney.  IMPRESSION: Mild to moderate left pleural effusion is noted with adjacent atelectasis of the left lower lobe. The smaller superior portion of this effusion appears to be loculated. This appears to be smaller compared to prior exam.   Electronically Signed   By: JMarijo Conception M.D.   On: 05/18/2014 12:13    Medications / Allergies: per chart  Antibiotics: Anti-infectives    Start     Dose/Rate Route Frequency Ordered Stop   05/16/14 2000  ciprofloxacin (CIPRO) tablet 500 mg     500 mg Oral 2 times daily 05/16/14 1624     05/16/14 1700  metroNIDAZOLE (FLAGYL) tablet 500 mg     500 mg Oral 3 times per day 05/16/14 1607     05/10/14 2000  ciprofloxacin (CIPRO) IVPB 400 mg  Status:  Discontinued     400 mg 200 mL/hr over 60 Minutes Intravenous Every 12 hours 05/10/14 1758 05/16/14 1624   05/10/14 1800  piperacillin-tazobactam (ZOSYN) IVPB 3.375 g  Status:  Discontinued     3.375 g  12.5 mL/hr over 240 Minutes Intravenous Every 8 hours 05/10/14 1743 05/16/14 1607   05/10/14 1100  doxycycline (VIBRA-TABS) tablet 100 mg  Status:  Discontinued     100 mg Oral 2 times daily 05/10/14 1008 05/11/14 2251      Shaneice Barsanti, PA-S [facility] [phone] 05/20/2014 8:40 AM

## 2014-05-21 ENCOUNTER — Telehealth: Payer: Self-pay | Admitting: *Deleted

## 2014-05-21 LAB — CBC
HCT: 27.8 % — ABNORMAL LOW (ref 36.0–46.0)
Hemoglobin: 8.7 g/dL — ABNORMAL LOW (ref 12.0–15.0)
MCH: 29.1 pg (ref 26.0–34.0)
MCHC: 31.3 g/dL (ref 30.0–36.0)
MCV: 93 fL (ref 78.0–100.0)
PLATELETS: 527 10*3/uL — AB (ref 150–400)
RBC: 2.99 MIL/uL — AB (ref 3.87–5.11)
RDW: 16.5 % — AB (ref 11.5–15.5)
WBC: 7.1 10*3/uL (ref 4.0–10.5)

## 2014-05-21 LAB — BASIC METABOLIC PANEL
Anion gap: 9 (ref 5–15)
BUN: 8 mg/dL (ref 6–23)
CO2: 22 mmol/L (ref 19–32)
Calcium: 8.6 mg/dL (ref 8.4–10.5)
Chloride: 106 mmol/L (ref 96–112)
Creatinine, Ser: 0.78 mg/dL (ref 0.50–1.10)
GFR calc Af Amer: >90 mL/min (ref >90)
GFR calc non Af Amer: 85 mL/min — ABNORMAL LOW (ref >90)
Glucose, Bld: 92 mg/dL (ref 70–99)
POTASSIUM: 3.4 mmol/L — AB (ref 3.5–5.1)
Sodium: 137 mmol/L (ref 135–145)

## 2014-05-21 LAB — PREALBUMIN: Prealbumin: 13 mg/dL — ABNORMAL LOW (ref 18.0–45.0)

## 2014-05-21 MED ORDER — HYDROCODONE-ACETAMINOPHEN 10-325 MG PO TABS: 0.5000 | ORAL_TABLET | Freq: Four times a day (QID) | ORAL | Status: DC | PRN

## 2014-05-21 MED ORDER — PSYLLIUM 95 % PO PACK: 1.0000 | PACK | Freq: Every day | ORAL | Status: DC

## 2014-05-21 MED ORDER — SACCHAROMYCES BOULARDII 250 MG PO CAPS: 250.0000 mg | ORAL_CAPSULE | Freq: Two times a day (BID) | ORAL | Status: DC

## 2014-05-21 MED ORDER — MAGIC MOUTHWASH: 15.0000 mL | Freq: Four times a day (QID) | ORAL | Status: DC | PRN

## 2014-05-21 MED ORDER — ENOXAPARIN SODIUM 120 MG/0.8ML ~~LOC~~ SOLN
1.5000 mg/kg | SUBCUTANEOUS | Status: DC
Start: 2014-05-21 — End: 2014-06-30

## 2014-05-21 MED ORDER — ACETAMINOPHEN 325 MG PO TABS: 650.0000 mg | ORAL_TABLET | Freq: Four times a day (QID) | ORAL | Status: DC | PRN

## 2014-05-21 MED ORDER — PANTOPRAZOLE SODIUM 40 MG PO TBEC: 40.0000 mg | DELAYED_RELEASE_TABLET | Freq: Two times a day (BID) | ORAL | Status: DC

## 2014-05-21 MED ORDER — METOPROLOL SUCCINATE ER 25 MG PO TB24: 12.5000 mg | ORAL_TABLET | Freq: Every day | ORAL | Status: DC

## 2014-05-21 MED ORDER — BISMUTH SUBSALICYLATE 262 MG/15ML PO SUSP: 30.0000 mL | Freq: Three times a day (TID) | ORAL | Status: DC | PRN

## 2014-05-21 MED ORDER — ALUM & MAG HYDROXIDE-SIMETH 200-200-20 MG/5ML PO SUSP: 30.0000 mL | Freq: Four times a day (QID) | ORAL | Status: DC | PRN

## 2014-05-21 MED ORDER — POTASSIUM CHLORIDE CRYS ER 20 MEQ PO TBCR
40.0000 meq | EXTENDED_RELEASE_TABLET | Freq: Once | ORAL | Status: AC
  Administered 2014-05-21: 40 meq via ORAL
  Filled 2014-05-21: qty 2

## 2014-05-21 MED ORDER — CIPROFLOXACIN HCL 500 MG PO TABS: 500.0000 mg | ORAL_TABLET | Freq: Two times a day (BID) | ORAL | Status: DC

## 2014-05-21 MED ORDER — LOPERAMIDE HCL 2 MG PO CAPS: 2.0000 mg | ORAL_CAPSULE | Freq: Three times a day (TID) | ORAL | Status: DC | PRN

## 2014-05-21 MED ORDER — METRONIDAZOLE 500 MG PO TABS: 500.0000 mg | ORAL_TABLET | Freq: Three times a day (TID) | ORAL | Status: DC

## 2014-05-21 MED ORDER — DILTIAZEM HCL ER COATED BEADS 120 MG PO CP24
120.0000 mg | ORAL_CAPSULE | Freq: Every day | ORAL | Status: DC
  Administered 2014-05-21: 120 mg via ORAL
  Filled 2014-05-21: qty 1

## 2014-05-21 MED ORDER — METOPROLOL SUCCINATE ER 25 MG PO TB24
12.5000 mg | ORAL_TABLET | Freq: Every day | ORAL | Status: DC
  Administered 2014-05-21: 12.5 mg via ORAL
  Filled 2014-05-21: qty 1

## 2014-05-21 NOTE — Progress Notes (Signed)
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  Mild dyspnea with exertion of getting in and out of bed.  No chest pain.  2-D echocardiogram shows good left ventricular systolic function.  CURRENT MEDS . acetaminophen  500 mg Oral TID WC & HS  . brimonidine  1 drop Both Eyes BID   And  . timolol  1 drop Both Eyes BID  . ciprofloxacin  500 mg Oral BID  . diltiazem  30 mg Oral 4 times per day  . enoxaparin (LOVENOX) injection  1.5 mg/kg Subcutaneous Q24H  . latanoprost  1 drop Both Eyes QHS  . lip balm  1 application Topical BID  . metroNIDAZOLE  500 mg Oral 3 times per day  . pantoprazole  40 mg Oral BID AC  . primidone  50 mg Oral QHS  . psyllium  1 packet Oral Daily  . saccharomyces boulardii  250 mg Oral BID  . sodium chloride  3 mL Intravenous Q12H  . sucralfate  1 g Oral TID WC & HS  . tamsulosin  0.4 mg Oral Daily    OBJECTIVE  Filed Vitals:   05/20/14 1418 05/20/14 1724 05/20/14 2125 05/21/14 0521  BP: 97/52 106/60 113/67 115/65  Pulse: 98  102 92  Temp: 98 F (36.7 C)  98.5 F (36.9 C) 98.4 F (36.9 C)  TempSrc: Oral  Oral Oral  Resp: 20  20 20   Height:      Weight:    188 lb 14.4 oz (85.684 kg)  SpO2: 99%  99% 99%    Intake/Output Summary (Last 24 hours) at 05/21/14 0756 Last data filed at 05/21/14 4496  Gross per 24 hour  Intake    495 ml  Output    456 ml  Net     39 ml   Filed Weights   05/18/14 0520 05/20/14 0657 05/21/14 0521  Weight: 189 lb 6.4 oz (85.911 kg) 189  lb 14.4 oz (86.138 kg) 188 lb 14.4 oz (85.684 kg)    PHYSICAL EXAM  General: Pleasant, NAD. Neuro: Alert and oriented X 3. Moves all extremities spontaneously. Psych: Normal affect. HEENT:  Normal  Neck: Supple without bruits or JVD. Lungs:  Resp regular and unlabored, decreased breath sounds at left base. Heart: RRR no s3, s4, or murmurs. Abdomen: Soft, non-tender, non-distended, BS + x 4.  Extremities: No clubbing, cyanosis or edema. DP/PT/Radials 2+ and equal bilaterally.  Accessory Clinical Findings  CBC  Recent Labs  05/19/14 0434 05/21/14 0518  WBC 9.6 7.1  HGB 8.7* 8.7*  HCT 28.3* 27.8*  MCV 94.3 93.0  PLT 528* 759*   Basic Metabolic Panel  Recent Labs  05/19/14 0434 05/21/14 0518  NA 138 137  K 3.7 3.4*  CL 107 106  CO2 23 22  GLUCOSE 91 92  BUN 8 8  CREATININE 0.85 0.78  CALCIUM 8.4 8.6  MG 1.6  --    Liver Function Tests No results for input(s): AST, ALT,  ALKPHOS, BILITOT, PROT, ALBUMIN in the last 72 hours. No results for input(s): LIPASE, AMYLASE in the last 72 hours. Cardiac Enzymes No results for input(s): CKTOTAL, CKMB, CKMBINDEX, TROPONINI in the last 72 hours. BNP Invalid input(s): POCBNP D-Dimer No results for input(s): DDIMER in the last 72 hours. Hemoglobin A1C No results for input(s): HGBA1C in the last 72 hours. Fasting Lipid Panel No results for input(s): CHOL, HDL, LDLCALC, TRIG, CHOLHDL, LDLDIRECT in the last 72 hours. Thyroid Function Tests  Recent Labs  05/19/14 0434  TSH 1.271    TELE  Sinus tachycardia  ECG  Sinus tachycardia  2-D echo:  - Left ventricle: The cavity size was normal. Systolic function was normal. The estimated ejection fraction was in the range of 55% to 60%. Wall motion was normal; there were no regional wall motion abnormalities. The study is not technically sufficient to allow evaluation of LV diastolic function. - Aortic valve: Trileaflet; normal thickness leaflets. There was  no regurgitation. - Aortic root: The aortic root was normal in size. - Mitral valve: There was trivial regurgitation. - Left atrium: The atrium was normal in size. - Right atrium: The atrium was normal in size. - Tricuspid valve: There was mild regurgitation. - Pulmonic valve: There was no regurgitation. - Pulmonary arteries: Systolic pressure was at the upper limits of normal. PA peak pressure: 38 mm Hg (S). - Inferior vena cava: The vessel was normal in size. - Pericardium, extracardiac: There was no pericardial effusion. There was a left pleural effusion.  Impressions:  - Normal biventricular size and function. No significant valvular abnormalities. Large left pleural effusion.  Radiology/Studies  Ct Abdomen Pelvis Wo Contrast  05/11/2014   CLINICAL DATA:  Evaluate for fluid collection. Pancreatic cancer. Previous distal pancreatectomy, splenectomy and partial colectomy.  EXAM: CT ABDOMEN AND PELVIS WITHOUT CONTRAST  TECHNIQUE: Multidetector CT imaging of the abdomen and pelvis was performed following the standard protocol without IV contrast.  COMPARISON:  04/29/2014  FINDINGS: Lung bases demonstrate a tiny amount of right pleural fluid and associated dependent atelectasis. There is a moderate size left pleural fluid collection with associated consolidation likely compressive atelectasis. Mild stable cardiomegaly.  Abdominal images demonstrate no change in positioning of the two left upper quadrant surgical drains. There is slightly less fluid adjacent the pigtail portion of the catheter. There is a slight decrease in size of a fluid collection over the surgical suture line overlying the distal pancreas measuring 2.1 x 2.7 cm (previously 2.7 x 3 cm) with no change in a small focus of air along the anterior aspect of the fluid collection.  Previous cholecystectomy. The liver, adrenal glands and kidneys are otherwise unchanged. IVC filter unchanged. Postsurgical change over the  anterior abdominal wall in the midline over the epigastric region.  Postsurgical change over the left colon as the anastomotic site is patent. There remains mild stranding of the adjacent mesenteric fat adjacent the anastomotic site. The appendix is normal. There is moderate diverticulosis throughout the colon.  Pelvic images demonstrate a Foley catheter within a decompressed bladder. Rectosigmoid colon is normal. There is a small amount of free fluid slightly improved. Degenerative changes of the spine and hips.  IMPRESSION: Two left upper quadrant surgical drains remain in place and unchanged in position. No significant fluid collection within the left upper quadrant.  Slight interval decrease in size of a fluid collection over the surgical bed adjacent the distal pancreatectomy measuring 2.1 x 2.7 cm (previously 2.7 x 3 cm).  Interval improvement small  amount of free pelvic fluid.  Stable stranding of the mesenteric fat adjacent the colonic anastomosis in the left mid abdomen likely postsurgical change.  Other stable chronic changes within the abdomen.  Continued moderate left pleural effusion with associated compressive atelectasis. Tiny right pleural fluid collection with associated atelectasis.   Electronically Signed   By: Marin Olp M.D.   On: 05/11/2014 17:02   Ct Abdomen Pelvis Wo Contrast  04/29/2014   CLINICAL DATA:  Intermittent nausea and vomiting with poor appetite and left upper quadrant soreness. History of distal pancreatectomy/ splenectomy and partial colectomy. Subsequent encounter.  EXAM: CT ABDOMEN AND PELVIS WITHOUT CONTRAST  TECHNIQUE: Multidetector CT imaging of the abdomen and pelvis was performed following the standard protocol without IV contrast.  COMPARISON:  04/24/2014.  MR abdomen 01/05/2014.  FINDINGS: Lower chest: Lung bases show a moderate simple appearing left pleural effusion with likely compressive atelectasis in the left lower lobe. Heart size within normal limits.  Decreased attenuation of the intravascular compartment is indicative of anemia. No pericardial effusion.  Hepatobiliary: A lobulated low-attenuation lesion in the inferior right hepatic lobe measures 2.5 cm, stable, characterized as a cyst on 01/05/2014. Liver is otherwise unremarkable. Cholecystectomy. Extrahepatic bile duct measures 1.4 cm, stable.  Pancreas: Status post distal pancreatectomy with a focal low-attenuation lesion at the surgical margin, measuring 2.7 x 3.0 cm, grossly stable in size. Pancreas is otherwise unremarkable.  Spleen: Surgically absent.  Adrenals/Urinary Tract: Adrenal glands and right kidney are unremarkable. Low-attenuation lesion in the interpolar left kidney measures 1.5 cm, stable and characterized as a cyst on 01/05/2014. Ureters are decompressed. Air in the bladder is presumably iatrogenic. Bladder wall may be slightly thickened.  Stomach/Bowel: Stomach, small bowel and proximal colon are unremarkable. An anastomosis is seen at the splenic flexure. Colon is otherwise unremarkable.  Vascular/Lymphatic: Atherosclerotic calcification of the arterial vasculature without abdominal aortic aneurysm. No pathologically enlarged lymph nodes. IVC filter is in place.  Reproductive: Hysterectomy.  Ovaries are visualized.  Other: Small pelvic free fluid. 2 percutaneous drains terminate in the left upper quadrant, along the posterior margin of the stomach and anterior margin of the left kidney. No definite associated fluid collection. Upper abdominal midline ventral hernia contains fat, small in size. Presumed subcutaneous injection densities and air in the ventral low abdomen and pelvis. Mesenteries and peritoneum are otherwise unremarkable.  Musculoskeletal: No worrisome lytic or sclerotic lesions.  IMPRESSION: 1. Postoperative changes of distal pancreatectomy with stable pseudocyst or seroma adjacent to the surgical margin. 2. Resolved left upper quadrant abscess with 2 percutaneous drains in  place. 3. Small pelvic free fluid. 4. Moderate left pleural effusion with likely compressive atelectasis in the left lower lobe. Difficult to definitively exclude pneumonia.   Electronically Signed   By: Lorin Picket M.D.   On: 04/29/2014 15:25   Ct Abdomen Pelvis Wo Contrast  04/24/2014   CLINICAL DATA:  Status post splenectomy and distal pancreatectomy, post drainage of intra-abdominal abscess  EXAM: CT ABDOMEN AND PELVIS WITHOUT CONTRAST  TECHNIQUE: Multidetector CT imaging of the abdomen and pelvis was performed following the standard protocol without IV contrast.  COMPARISON:  04/15/2014 and 04/16/2014  FINDINGS: Sagittal images of the spine shows diffuse osteopenia. Mild degenerative changes lumbar spine. There is moderate left pleural effusion with left lower lobe atelectasis.  Again noted status post cholecystectomy. Unenhanced liver is unremarkable. The patient is status post splenectomy and distal pancreatectomy. A percutaneous drainage catheter is noted in left flank with tip in left upper quadrant  anterior to upper pole of the left kidney. There is significant improvement from prior exam. The previous subdiaphragmatic fluid has resolved. The collection anterior to left kidney has resolved. Stable postsurgical changes at the site of distal pancreatectomy. Small residual seroma at this level measures 2.8 cm stable in size in appearance from prior exam. A cyst in midpole posterior aspect of the left kidney is stable. Postsurgical drain in left lower abdomen is stable. No new low abscess is noted in left abdomen. Only tiny amount of residual fluid/air is noted just anterior to the surgical drain in left upper abdomen axial image 14 measures about 2.4 cm . IVC filter in place again noted. There is no small bowel obstruction. No ascites or free air. Normal appendix partially visualized. Mild anasarca infiltration subcutaneous fat bilateral flank wall. Again noted subcutaneous round density consistent  with subcutaneous injections lower anterior abdominal wall.  No nephrolithiasis. No hydronephrosis or hydroureter. No distal colonic obstruction. There is a Foley catheter within a decompressed urinary bladder. Air is noted within anterior aspect of the bladder.  IMPRESSION: 1. The patient is status post splenectomy and distal pancreatectomy. A percutaneous drainage catheter is noted in left flank with tip in left upper quadrant anterior to upper pole of the left kidney. There is significant improvement from prior exam. The previous subdiaphragmatic fluid has resolved. The collection anterior to left kidney has resolved. 2. There is moderate left pleural effusion with left lower lobe atelectasis. 3. No new abscess collection is noted. Only tiny amount of residual fluid/air is noted just anterior to the surgical drain in left upper abdomen axial image 14 measures about 2.4 cm 4. Stable postsurgical changes at the site of distal pancreatectomy. 5. IVC filter in place again noted. 6. No small bowel or colonic obstruction. 7. There is Foley catheter in decompressed urinary bladder. Moderate air is noted anterior aspect of the bladder probable post instrumentation.   Electronically Signed   By: Lahoma Crocker M.D.   On: 04/24/2014 18:43   Dg Chest 1 View  05/14/2014   CLINICAL DATA:  Post attempted left-sided thoracentesis  EXAM: CHEST  1 VIEW  COMPARISON:  Earlier same day ; 05/13/2014; 05/10/2014; CT abdomen pelvis - 05/11/2014  FINDINGS: Grossly unchanged borderline enlarged cardiac silhouette and mediastinal contours with persistent obscuration of left heart border secondary to grossly unchanged small loculated left-sided effusion with associated left basilar consolidative opacities. No new focal airspace opacities. No pneumothorax. No evidence of edema. No acute osseus abnormalities. Percutaneous drainage catheter overlies the left upper abdominal quadrant.  IMPRESSION: 1. Unchanged small partially loculated  left-sided effusion post attempted thoracentesis. No pneumothorax. 2. Grossly unchanged consolidative opacities within the left lower lung.   Electronically Signed   By: Sandi Mariscal M.D.   On: 05/14/2014 15:45   Dg Chest 1 View  05/10/2014   CLINICAL DATA:  Status post thoracentesis  EXAM: CHEST  1 VIEW  COMPARISON:  Chest radiograph and chest CT May 09, 2014  FINDINGS: No pneumothorax. Left effusion smaller following thoracentesis. There is some residual left effusion with left base atelectasis. There is a chest tube in the left subpleural region as well as a pigtail catheter in the left upper abdomen.  Right lung is clear. Heart is mildly enlarged with pulmonary vascularity, stable.  IMPRESSION: Left effusion smaller following thoracentesis. There is a small left effusion with left base atelectasis. Subpulmonic chest tube remains on the left as does a drain in the upper abdomen on the left. Right  lung is clear. No change in cardiac silhouette. No demonstrable pneumothorax.   Electronically Signed   By: Lowella Grip III M.D.   On: 05/10/2014 14:13   Dg Chest 2 View  05/14/2014   CLINICAL DATA:  Followup pleural effusion.  EXAM: CHEST  2 VIEW  COMPARISON:  Chest x-ray 05/13/2014 and CT abdomen 05/11/2014.  FINDINGS: Persistent small left pleural effusion with overlying atelectasis. The right lung remains clear. Stable left upper quadrant drainage catheter.  IMPRESSION: Persistent left pleural effusion with overlying atelectasis.   Electronically Signed   By: Marijo Sanes M.D.   On: 05/14/2014 14:10   Dg Chest 2 View  05/13/2014   CLINICAL DATA:  Pancreatic cancer, shortness of breath, pleural effusions, weakness  EXAM: CHEST  2 VIEW  COMPARISON:  05/10/2014  FINDINGS: Pigtail drainage catheter LEFT upper quadrant.  Enlargement of cardiac silhouette.  Atherosclerotic calcification aorta.  Mediastinal contours and pulmonary vascularity otherwise normal.  Increased LEFT pleural effusion and basilar  atelectasis since previous exam.  Remaining lungs clear.  No pneumothorax.  Bones demineralized.  IMPRESSION: Increased LEFT pleural effusion and basilar atelectasis.  Enlargement of cardiac silhouette.   Electronically Signed   By: Lavonia Dana M.D.   On: 05/13/2014 14:11   Dg Chest 2 View  05/09/2014   CLINICAL DATA:  69 year old female with shortness of breath and chest pain. Initial encounter.  Personal history of distal pancreatectomy and splenectomy.  EXAM: CHEST  2 VIEW  COMPARISON:  Chest and abdominal series 4 /09/2014. CT Abdomen and Pelvis 04/29/2014, portable chest 04/17/2014.  FINDINGS: Semi upright AP and lateral views of the chest. Left pleural effusion has progressed and is moderate to large. Increased opacification at the left lung base. Lower lung volumes.  Stable cardiac size and mediastinal contours. No pneumothorax or pulmonary edema. Grossly clear right lung. Visualized tracheal air column is within normal limits.  Left abdomen percutaneous drains remain in place. Partially visible IVC filter.  IMPRESSION: 1. Progressed left pleural effusion, now moderate to large. Associated decreased ventilation in the left lung. 2. Lower lung volumes.   Electronically Signed   By: Genevie Ann M.D.   On: 05/09/2014 08:49   Ct Chest W Contrast  05/18/2014   CLINICAL DATA:  Loculated left pleural effusion, chest pain.  EXAM: CT CHEST WITH CONTRAST  TECHNIQUE: Multidetector CT imaging of the chest was performed during intravenous contrast administration.  CONTRAST:  26mL OMNIPAQUE IOHEXOL 300 MG/ML  SOLN  COMPARISON:  CT scan of May 09, 2014.  FINDINGS: No pneumothorax is noted. Mild to moderate left pleural effusion is noted with associated atelectasis of the left lower lobe. The superior portion of this effusion appears to be loculated. There is no evidence of thoracic aortic dissection or aneurysm. Visualized portions of pulmonary arteries appear normal. No mediastinal mass or adenopathy is noted no  significant osseous abnormality is noted. Percutaneous drain is again noted between the stomach and left kidney.  IMPRESSION: Mild to moderate left pleural effusion is noted with adjacent atelectasis of the left lower lobe. The smaller superior portion of this effusion appears to be loculated. This appears to be smaller compared to prior exam.   Electronically Signed   By: Marijo Conception, M.D.   On: 05/18/2014 12:13   Ct Angio Chest Pe W/cm &/or Wo Cm  05/09/2014   CLINICAL DATA:  Shortness of breath, chest pain.  EXAM: CT ANGIOGRAPHY CHEST WITH CONTRAST  TECHNIQUE: Multidetector CT imaging of the chest was performed  using the standard protocol during bolus administration of intravenous contrast. Multiplanar CT image reconstructions and MIPs were obtained to evaluate the vascular anatomy.  CONTRAST:  179mL OMNIPAQUE IOHEXOL 350 MG/ML SOLN  COMPARISON:  CT scan of April 29, 2014.  FINDINGS: No pneumothorax is noted. Moderate left pleural effusion is noted with associated atelectasis of the left lower lobe. Right lung is clear. There is no evidence of thoracic aortic dissection or aneurysm. There is no definite evidence of pulmonary embolus. No mediastinal mass or adenopathy is noted. No significant osseous abnormality is noted. Continued presence of drainage catheter seen between the stomach and left kidney. Continued presence of fluid collection seen medial to proximal stomach.  Review of the MIP images confirms the above findings.  IMPRESSION: No evidence of pulmonary embolus.  Moderate left pleural effusion is noted with associated atelectasis of the left lower lobe.  Continued presence of drainage catheter seen between the stomach and left kidney, as well as continued and stable appearance of fluid collection medial to proximal stomach. Please refer to report of CT scan of April 29, 2014.   Electronically Signed   By: Marijo Conception, M.D.   On: 05/09/2014 16:56   Ir Sinus/fist Tube Chk-non Gi  05/17/2014    CLINICAL DATA:  Pancreatic carcinoma, post distal pancreatectomy and splenectomy with persistent leak. Surgical drain remains in place into pigtail catheter had been placed into a retroperitoneal collection ; output has tapered off.  EXAM: RETROPERITONEAL DRAIN CATHETER INJECTION UNDER FLUOROSCOPY  TECHNIQUE: The procedure, risks (including but not limited to bleeding, infection, organ damage ), benefits, and alternatives were explained to the patient. Questions regarding the procedure were encouraged and answered. The patient understands and consents to the procedure.  Survey fluoroscopic inspection reveals stable position of pigtail catheter in the left upper abdomen and surgical drain to the subphrenic space. A small left pleural effusion is noted.  Injection of the left upper quadrant percutaneous pigtail drain catheter demonstrates filling of a small irregular cavity around the drain catheter. With continued injection, contrast tracks back along the catheter and exits the skin entry site. There is no opacification of the pancreatic duct or biliary tree. With aspiration, most of the injected contrast can be removed.  After telephone consultation with Dr. Barry Dienes, the decision was made to proceed with injection of the left lateral surgical drain catheter. However, the catheter is occluded such that injection using a moderately vigorous pressure did not result in any contrast entering the drain catheter. Inspection demonstrates that the retention sutures is not occlusive. High pressure injection was deferred. Both catheters were returned to bulb drainage.  The patient tolerated the procedure well. No immediate complication.  IMPRESSION: 1. Small residual cavity around the pigtail drain catheter, with NO communication to the pancreatic duct or biliary tree opacified. 2. Occlusion of surgical drain catheter.   Electronically Signed   By: Lucrezia Europe M.D.   On: 05/17/2014 15:03   Dg Abd Acute W/chest  05/08/2014    CLINICAL DATA:  Left upper quadrant and left lower quadrant abdominal pain.  EXAM: DG ABDOMEN ACUTE W/ 1V CHEST  COMPARISON:  04/29/2014  FINDINGS: Two surgical drains are identified within the left upper quadrant of the abdomen. These are similar in position to 3/30 1/16. There is an IVC filter in place and there are surgical clips from prior cholecystectomy. There is no evidence of dilated bowel loops or free intraperitoneal air. No radiopaque calculi or other significant radiographic abnormality is seen. Heart  size and mediastinal contours are within normal limits. Both lungs are clear. Moderate left pleural effusion is again noted.  IMPRESSION: 1. Nonobstructive bowel gas pattern. 2. Left pleural effusion 3. Similar position of left upper quadrant surgical drains.   Electronically Signed   By: Kerby Moors M.D.   On: 05/08/2014 18:27   US Thoracentesis Asp Pleural Space W/img Guide  05/14/2014   INDICATION: Symptomatic left sided pleural effusion  EXAM: US THORACENTESIS ASP PLEURAL SPACE W/IMG GUIDE  COMPARISON:  Chest radiograph - earlier same day; 05/13/2014; 05/10/2014; CT abdomen pelvis - 05/11/2014  MEDICATIONS: None  COMPLICATIONS: None immediate  TECHNIQUE: Informed written consent was obtained from the patient after a discussion of the risks, benefits and alternatives to treatment. A timeout was performed prior to the initiation of the procedure.  Initial ultrasound scanning demonstrates a small pleural effusion with multiple internal echogenic septations and apparent tethering of the left lower lobe (representative images 1 through 4). The lower chest was prepped and draped in the usual sterile fashion. 1% lidocaine was used for local anesthesia.  A 5 [place] Yueh sheath needle was advanced into an anechoic component left-sided pleural effusion however only yielded approximately 2 cc of fluid. Yueh sheath needle was then advanced into a separate anechoic component of the left-sided pleural effusion  however only approximately 8 cc of fluid was able to be aspirated. Multiple ultrasound images were saved for documentation purposes.  The catheter was removed and a dressing was applied. The patient tolerated the procedure well without immediate post procedural complication. The patient was escorted to have an upright chest radiograph.  FINDINGS: A total of approximately 10 cc of blood tinged serous fluid was removed.  IMPRESSION: Successful ultrasound-guided left sided thoracentesis yielding only 10 cc liters of pleural fluid secondary to marked loculation of the effusion.   Electronically Signed   By: Sandi Mariscal M.D.   On: 05/14/2014 16:06   US Thoracentesis Asp Pleural Space W/img Guide  05/10/2014   INDICATION: Symptomatic left sided pleural effusion  EXAM: US THORACENTESIS ASP PLEURAL SPACE W/IMG GUIDE  COMPARISON:  CXR 05/09/14.  MEDICATIONS: None  COMPLICATIONS: None immediate  TECHNIQUE: Informed written consent was obtained from the patient after a discussion of the risks, benefits and alternatives to treatment. A timeout was performed prior to the initiation of the procedure.  Initial ultrasound scanning demonstrates a left pleural effusion. The lower chest was prepped and draped in the usual sterile fashion. 1% lidocaine was used for local anesthesia.  Under direct ultrasound guidance, a 19 gauge, 10-cm, Yueh catheter was introduced. An ultrasound image was saved for documentation purposes. The thoracentesis was performed. The catheter was removed and a dressing was applied. The patient tolerated the procedure well without immediate post procedural complication. The patient was escorted to have an upright chest radiograph.  FINDINGS: A total of approximately 500 ml of blood tinged fluid was removed. Requested samples were sent to the laboratory.  IMPRESSION: Successful ultrasound-guided left sided thoracentesis yielding 500 ml of pleural fluid.  Read By:  Tsosie Billing PA-C   Electronically Signed    By: Sandi Mariscal M.D.   On: 05/10/2014 14:14    ASSESSMENT AND PLAN 1.  Sinus tachycardia, multifactorial including anemia and large left pleural effusion 2.  Recent fevers 3.  History of pancreatic cancer and she is status post distal pancreatectomy, splenectomy, and partial colectomy on 04/08/14 4.  Left pleural effusion  Plan: Switch diltiazem back to diltiazem CD 120 mg daily. we  will add low-dose beta blocker Toprol 12.5 mg daily.  Okay for discharge today from cardiac standpoint    Signed, Darlin Coco MD

## 2014-05-21 NOTE — Telephone Encounter (Signed)
Transition Care Management Follow-up Telephone Call D/C 05/21/14  How have you been since you were released from the hospital? spoke with daughter ([name]) which mom was with her just d/c this am on their way to SNF   Do you understand why you were in the hospital? YES   Do you understand the discharge instrcutions? YES  Items Reviewed:  Medications reviewed: YES  Allergies reviewed: YES  Dietary changes reviewed: YES, low sodium  Referrals reviewed: No referrals needed   Functional Questionnaire:   Activities of Daily Living (ADLs):   Daughter states she are independent in the following: feeding, grooming and toileting States she require assistance with the following: ambulation and continence   Any transportation issues/concerns?: NO   Any patient concerns? NO   Confirmed importance and date/time of follow-up visits scheduled: YES, appt has been made for 06/08/14 w/Greg calone   Confirmed with patient if condition begins to worsen call PCP or go to the ER.

## 2014-05-21 NOTE — Progress Notes (Signed)
Patient ID: [name], female   DOB: 1945/05/13, 69 y.o.   MRN: [id number]    Referring Physician(s): CCS  Subjective:  Pt doing well; planning for d/c today; LUQ drain to remain in place until f/u appt with IR next week   Allergies: Sulfa antibiotics  Medications: Prior to Admission medications   Medication Sig Start Date End Date Taking? Authorizing Provider  atorvastatin (LIPITOR) 40 MG tablet Take 1 tablet (40 mg total) by mouth daily. 05/05/14  Yes Gerlene Fee, NP  brimonidine-timolol (COMBIGAN) 0.2-0.5 % ophthalmic solution Place 1 drop into the left eye every 12 (twelve) hours.   Yes Historical Provider, MD  diltiazem (CARTIA XT) 120 MG 24 hr capsule Take 1 capsule (120 mg total) by mouth daily. 05/05/14  Yes Gerlene Fee, NP  DOXYCYCLINE HYCLATE PO Take 100 mg by mouth 2 (two) times daily.   Yes Historical Provider, MD  latanoprost (XALATAN) 0.005 % ophthalmic solution Place 1 drop into both eyes at bedtime.   Yes Historical Provider, MD  primidone (MYSOLINE) 50 MG tablet Take 50 mg by mouth at bedtime. Patient takes at nite   Yes Historical Provider, MD  propranolol ER (INDERAL LA) 120 MG 24 hr capsule TAKE ONE CAPSULE BY MOUTH DAILY 03/28/14  Yes Mancel Bale, PA-C  sucralfate (CARAFATE) 1 GM/10ML suspension Take 10 mLs (1 g total) by mouth 4 (four) times daily -  with meals and at bedtime. 05/04/14  Yes [name], MD  tamsulosin (FLOMAX) 0.4 MG CAPS capsule Take 1 capsule (0.4 mg total) by mouth daily. 05/04/14  Yes [name], MD  traMADol (ULTRAM) 50 MG tablet Take 1 tablet (50 mg total) by mouth every 6 (six) hours as needed. 05/04/14  Yes [name], MD  acetaminophen (TYLENOL) 325 MG tablet Take 2 tablets (650 mg total) by mouth every 6 (six) hours as needed for mild pain or fever. 05/21/14   Bonnielee Haff, MD  alum & mag hydroxide-simeth (MAALOX/MYLANTA) 200-200-20 MG/5ML suspension Take 30 mLs by mouth every 6 (six) hours as needed for indigestion or heartburn  (or bloating). 05/21/14   Bonnielee Haff, MD  Alum & Mag Hydroxide-Simeth (MAGIC MOUTHWASH) SOLN Take 15 mLs by mouth 4 (four) times daily as needed for mouth pain (sore throat). 05/21/14   Bonnielee Haff, MD  bismuth subsalicylate (PEPTO BISMOL) 262 MG/15ML suspension Take 30 mLs by mouth every 8 (eight) hours as needed for indigestion or diarrhea or loose stools. 05/21/14   Bonnielee Haff, MD  ciprofloxacin (CIPRO) 500 MG tablet Take 1 tablet (500 mg total) by mouth 2 (two) times daily. 05/21/14   Bonnielee Haff, MD  enoxaparin (LOVENOX) 120 MG/0.8ML injection Inject 0.86 mLs (130 mg total) into the skin daily. 05/21/14   Bonnielee Haff, MD  HYDROcodone-acetaminophen (NORCO) 10-325 MG per tablet Take 0.5-1 tablets by mouth every 6 (six) hours as needed for moderate pain or severe pain. 05/21/14   Bonnielee Haff, MD  loperamide (IMODIUM) 2 MG capsule Take 1 capsule (2 mg total) by mouth 3 (three) times daily as needed for diarrhea or loose stools. 05/21/14   Bonnielee Haff, MD  metoprolol succinate (TOPROL-XL) 25 MG 24 hr tablet Take 0.5 tablets (12.5 mg total) by mouth daily. 05/21/14   Bonnielee Haff, MD  metroNIDAZOLE (FLAGYL) 500 MG tablet Take 1 tablet (500 mg total) by mouth every 8 (eight) hours. 05/21/14   Bonnielee Haff, MD  pantoprazole (PROTONIX) 40 MG tablet Take 1 tablet (40 mg total) by mouth 2 (two)  times daily before a meal. 05/21/14   Bonnielee Haff, MD  psyllium (HYDROCIL/METAMUCIL) 95 % PACK Take 1 packet by mouth daily. 05/21/14   Bonnielee Haff, MD  saccharomyces boulardii (FLORASTOR) 250 MG capsule Take 1 capsule (250 mg total) by mouth 2 (two) times daily. 05/21/14   Bonnielee Haff, MD     Vital Signs: BP 115/65 mmHg  Pulse 92  Temp(Src) 98.4 F (36.9 C) (Oral)  Resp 20  Ht 5' 9.5" (1.765 m)  Wt 188 lb 14.4 oz (85.684 kg)  BMI 27.50 kg/m2  SpO2 99%  Physical Exam pt awake/alert; LUQ drain intact, output minimal ; drain irrigated with 5 cc's sterile NS with return of same amt;  new statlock device placed over insertion site  Imaging: Ct Chest W Contrast  05/18/2014   CLINICAL DATA:  Loculated left pleural effusion, chest pain.  EXAM: CT CHEST WITH CONTRAST  TECHNIQUE: Multidetector CT imaging of the chest was performed during intravenous contrast administration.  CONTRAST:  27mL OMNIPAQUE IOHEXOL 300 MG/ML  SOLN  COMPARISON:  CT scan of May 09, 2014.  FINDINGS: No pneumothorax is noted. Mild to moderate left pleural effusion is noted with associated atelectasis of the left lower lobe. The superior portion of this effusion appears to be loculated. There is no evidence of thoracic aortic dissection or aneurysm. Visualized portions of pulmonary arteries appear normal. No mediastinal mass or adenopathy is noted no significant osseous abnormality is noted. Percutaneous drain is again noted between the stomach and left kidney.  IMPRESSION: Mild to moderate left pleural effusion is noted with adjacent atelectasis of the left lower lobe. The smaller superior portion of this effusion appears to be loculated. This appears to be smaller compared to prior exam.   Electronically Signed   By: Marijo Conception, M.D.   On: 05/18/2014 12:13   Ir Sinus/fist Tube Chk-non Gi  05/17/2014   CLINICAL DATA:  Pancreatic carcinoma, post distal pancreatectomy and splenectomy with persistent leak. Surgical drain remains in place into pigtail catheter had been placed into a retroperitoneal collection ; output has tapered off.  EXAM: RETROPERITONEAL DRAIN CATHETER INJECTION UNDER FLUOROSCOPY  TECHNIQUE: The procedure, risks (including but not limited to bleeding, infection, organ damage ), benefits, and alternatives were explained to the patient. Questions regarding the procedure were encouraged and answered. The patient understands and consents to the procedure.  Survey fluoroscopic inspection reveals stable position of pigtail catheter in the left upper abdomen and surgical drain to the subphrenic space. A  small left pleural effusion is noted.  Injection of the left upper quadrant percutaneous pigtail drain catheter demonstrates filling of a small irregular cavity around the drain catheter. With continued injection, contrast tracks back along the catheter and exits the skin entry site. There is no opacification of the pancreatic duct or biliary tree. With aspiration, most of the injected contrast can be removed.  After telephone consultation with Dr. Barry Dienes, the decision was made to proceed with injection of the left lateral surgical drain catheter. However, the catheter is occluded such that injection using a moderately vigorous pressure did not result in any contrast entering the drain catheter. Inspection demonstrates that the retention sutures is not occlusive. High pressure injection was deferred. Both catheters were returned to bulb drainage.  The patient tolerated the procedure well. No immediate complication.  IMPRESSION: 1. Small residual cavity around the pigtail drain catheter, with NO communication to the pancreatic duct or biliary tree opacified. 2. Occlusion of surgical drain catheter.   Electronically  Signed   By: Lucrezia Europe M.D.   On: 05/17/2014 15:03    Labs:  CBC:  Recent Labs  05/17/14 0417 05/18/14 0532 05/19/14 0434 05/21/14 0518  WBC 7.9 11.2* 9.6 7.1  HGB 8.7* 9.4* 8.7* 8.7*  HCT 27.5* 30.6* 28.3* 27.8*  PLT 420* 536* 528* 527*    COAGS:  Recent Labs  04/01/14 1400 04/08/14 0630 04/09/14 0404 05/09/14 0655  INR 1.11 1.06 1.12 1.08  APTT  --  40* 33  --     BMP:  Recent Labs  05/17/14 0417 05/18/14 0532 05/19/14 0434 05/21/14 0518  NA 133* 135 138 137  K 3.0* 4.3 3.7 3.4*  CL 99 104 107 106  CO2 24 23 23 22   GLUCOSE 94 113* 91 92  BUN 6 8 8 8   CALCIUM 8.3* 8.7 8.4 8.6  CREATININE 0.79 0.89 0.85 0.78  GFRNONAA 84* 66* 69* 85*  GFRAA >90 76* 80* >90    LIVER FUNCTION TESTS:  Recent Labs  04/24/14 2039  04/30/14 0450  05/03/14 0440  05/04/14 0415 05/09/14 0655 05/10/14 0350 05/16/14 0422  BILITOT 0.5  --  0.3  --   --   --  0.5  --  0.3  AST 30  --  29  --   --   --  38*  --  36  ALT 13  --  10  --   --   --  17  --  16  ALKPHOS 83  --  65  --   --   --  78  --  88  PROT 6.1  --  6.0  --   --   --  7.6 6.7 6.8  ALBUMIN 2.4*  < > 3.0*  < > 3.1* 3.2* 3.8  --  2.8*  < > = values in this interval not displayed.  Assessment and Plan: S/p LUQ abscess drainage 3/18 (post distal pancreatectomy/splenectomy/partial colectomy 04/08/14 for panc ca; afebrile; WBC nl; hgb 8.7, creat nl; surgical drain removed today; LUQ drain to remain in place until pt seen in IR drain clinic ([phone]) for f/u injection next week ; rec once daily flushing of drain with 5 cc's sterile NS, recording of output and daily dressing changes   Signed: Jered Heiny,D KEVIN 05/21/2014, 10:40 AM   I spent a total of 15 minutes in face to face in clinical consultation/evaluation, greater than 50% of which was counseling/coordinating care for LUQ abscess drain

## 2014-05-21 NOTE — Discharge Summary (Signed)
[facility]  Physician Discharge Summary   Patient ID: [name] MRN: [id number] DOB/AGE: May 11, 1945 69 y.o.  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 Left upper quadrant drain to remain in place. Patient to be seen in the interventional radiology drain clinic for follow-up next week. 2. The drain to be flushed daily with 5 cc of steroid normal saline. Output to be recorded daily. Dressing changes daily. 3. Voiding trial at the skilled nursing facility in one week. If she fails please reinsert Foley catheter and seek appointment with urologist, Dr. Junious Silk.   DISCHARGE CONDITION: fair  Diet recommendation: Low-sodium  Filed Weights   05/18/14 0520 05/20/14 0657 05/21/14 0521  Weight: 85.911 kg (189 lb 6.4 oz) 86.138 kg (189 lb 14.4 oz) 85.684 kg (188 lb 14.4 oz)    INITIAL HISTORY: 69 y.o. 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 She has followed with Dr Burr Medico with plan is to start chemo soon. Pt presented with left sided chest pain and upper abd pain and was found to have moderate sized pleural effusion requiring IR guided left thoracentesis 4/11 with removal of 500 cc of fluid.    Patient's hospital course was complicated by persistent loculated pleural effusion possibly causing persistent tachycardia and requiring consultation by cardiothoracic and cardiology team, respectively.   Consultations: PCCM  IR Surgery  Cardiology  Cardiothoracic surgery   Procedures: Ultrasound-guided thoracentesis  2-D echocardiogram Study Conclusions - Left ventricle: The cavity size was normal. Systolic function wasnormal. The estimated ejection fraction was in the range of 55%to 60%. Wall motion was normal; there were no regional wallmotion abnormalities. The study is not technically sufficient toallow evaluation of LV diastolic function. - Aortic valve: Trileaflet; normal thickness leaflets. There was noregurgitation. - Aortic root: The aortic root was normal in size. - Mitral valve: There was trivial regurgitation. - Left atrium: The atrium was normal in size. - Right atrium: The atrium was normal in size. - Tricuspid valve: There was mild regurgitation. - Pulmonic valve: There was no regurgitation. - Pulmonary arteries: Systolic pressure was at the upper limits ofnormal. PA peak pressure: 38 mm Hg (S). - Inferior vena cava: The vessel was normal in size. - Pericardium, extracardiac: There was no pericardial effusion.There was a left pleural effusion. Impressions: - Normal biventricular size and function. No significant valvularabnormalities. Large left pleural effusion.  HOSPITAL COURSE:   Left chest/ abdominal pain Symptoms was likely secondary to the pleural effusion as well as her abdominal abscess. She underwent thoracentesis on April 11. It was repeated on April 16 with minimal output. Cultures from the abdominal fluid revealed Pseudomonas. Initially was treated with the Zosyn. Then she was changed over to Cipro and Flagyl. Discussions were held with infectious diseases regarding  the same. Pain is reasonably well controlled.  Left pleural effusion This  was thought to be possibly related to extension of the abd abscess. As mentioned above she underwent thoracentesis on April 11 and then again on April 16. Underwent CT chest as well, which showed persistence of the effusion. However, the loculated area seems to be decreased in size. Because of the loculation cardiothoracic surgery was consulted. Patient is not a candidate for any surgical intervention. A drainage tube could be considered, but because the size of the fluid was stable or improved, this was thought to be not necessary currently. Should be followed as an outpatient. Pleural fluid cultures have been negative.  LUQ abscess with recent cultures growing Pseudomonas Thought to be a sequelae of recent surgery for pancreatic cancer. Status post drainage on 3/18 with cultures positive for Pseudomonas. Initially placed on Zosyn. Changed to Cipro and Flagyl per ID. 2. Continue with antibiotics for 3-6 weeks total. Duration to be determined by surgery. Based on clinical progression. Repeat abdominal CT on April 12, shows decreasing size of abscess. She had 2 drains, one of which has been removed. The other drain to be managed by interventional radiology as outpatient. So that drain to be left in place for now.  Slight volume overload She had gained significant amount of weight. She was given a few doses of Lasix. Weight has come down significantly.  Persistent tachycardia Patient was seen by cardiology. She was placed on Cardizem and beta blocker has been introduced today. Echocardiogram as above. Heart rate is stable.  Hypokalemia Supplemented  History of DVT s/p IVC filter She is also on Lovenox. This will be continued for now on once a day basis.  Acute renal failure This was thought to be prerenal. Creatinine is now normal.  Anemia of acute illness imposed on anemia of chronic disease, malignancy There was no sign of acute bleeding. There was no indication for transfusion.  Sepsis  secondary to the above, L pleural effusion, ? parapneumonic Now stable. Continue ABX as noted above  Pancreatic cancer of tail, s/p distal pancreatectomy, splenectomy, & partial colectomy 04/08/2014 Management per general surgery. Follow-up as outpatient  Severe PCM In the context of acute illness. Continue nutritional supplements.  Urinary retention Despite multiple voiding trails after surgery, she continued to retain urine. She was started on Flomax. One possibility is that her decreased mobility could be predisposing her to retain urine. She is going to go for rehabilitation. Once her activity level improves another voiding trial can be given. If she fails that, as well. Follow up with urology will be recommended. Continue Foley catheter for now.  Tremors   On Primidone and stable at this time   Acute functional quadriplegia In the setting of acute illness. Still very weak but trying to participate in PT. will benefit from short-term rehabilitation.  Overall, stable. Okay for discharge to [facility] facility today.  PERTINENT LABS:  The results of significant diagnostics from this hospitalization (including imaging, microbiology, ancillary and laboratory) are listed below for reference.     Labs: Basic Metabolic Panel:  Recent Labs Lab 05/15/14 0846 05/16/14 0422 05/17/14 0417 05/18/14 0532 05/19/14 0434 05/21/14 0518  NA  --  135 133* 135 138 137  K  --  3.2* 3.0* 4.3 3.7 3.4*  CL  --  101 99 104 107 106  CO2  --  26 24 23 23 22   GLUCOSE  --  107* 94 113* 91 92  BUN  --  8 6 8 8 8   CREATININE  --  1.05 0.79 0.89 0.85 0.78  CALCIUM  --  8.4 8.3* 8.7 8.4 8.6  MG 1.3* 1.9 1.8 1.7 1.6  --    Liver Function Tests:  Recent Labs Lab 05/16/14 0422  AST 36  ALT 16  ALKPHOS 88  BILITOT 0.3  PROT 6.8  ALBUMIN 2.8*    Recent Labs Lab 05/16/14 0422  AMYLASE 48   CBC:  Recent Labs Lab 05/16/14 0422 05/17/14 0417 05/18/14 0532 05/19/14 0434  05/21/14 0518  WBC 9.3 7.9 11.2* 9.6 7.1  HGB 8.7* 8.7* 9.4* 8.7* 8.7*  HCT 28.3* 27.5* 30.6* 28.3* 27.8*  MCV 93.7 93.2 93.9 94.3 93.0  PLT 410* 420* 536* 528* 527*     IMAGING STUDIES Ct Abdomen Pelvis Wo Contrast  05/11/2014   CLINICAL DATA:  Evaluate for fluid collection. Pancreatic cancer. Previous distal pancreatectomy, splenectomy and partial colectomy.  EXAM: CT ABDOMEN AND PELVIS WITHOUT CONTRAST  TECHNIQUE: Multidetector CT imaging of the abdomen and pelvis was performed following the standard protocol without IV contrast.  COMPARISON:  04/29/2014  FINDINGS: Lung bases demonstrate a tiny amount of right pleural fluid and associated dependent atelectasis. There is a moderate size left pleural fluid collection with associated consolidation likely compressive atelectasis. Mild stable cardiomegaly.  Abdominal images demonstrate no change in positioning of the two left upper quadrant surgical drains. There is slightly less fluid adjacent the pigtail portion of the catheter. There is a slight decrease in size of a fluid collection over the surgical suture line overlying the distal pancreas measuring 2.1 x 2.7 cm (previously 2.7 x 3 cm) with no change in a small focus of air along the anterior aspect of the fluid collection.  Previous cholecystectomy. The liver, adrenal glands and kidneys are otherwise unchanged. IVC filter unchanged. Postsurgical change over the anterior abdominal wall in the midline over the epigastric region.  Postsurgical change over the left colon as the anastomotic site is patent. There remains mild stranding of the adjacent mesenteric fat adjacent the anastomotic site. The appendix is normal. There is moderate diverticulosis throughout the colon.  Pelvic images demonstrate a Foley catheter within a decompressed bladder. Rectosigmoid colon is normal. There is a small amount of free fluid slightly improved. Degenerative changes of the spine and hips.  IMPRESSION: Two left upper  quadrant surgical drains remain in place and unchanged in position. No significant fluid collection within the left upper quadrant.  Slight interval decrease in size of a fluid collection over the surgical bed adjacent the distal pancreatectomy measuring 2.1 x 2.7 cm (previously 2.7 x 3 cm).  Interval improvement small amount of free pelvic fluid.  Stable stranding of the mesenteric fat adjacent the colonic anastomosis in the left mid abdomen likely postsurgical change.  Other stable chronic changes within the abdomen.  Continued moderate left pleural effusion with associated compressive atelectasis. Tiny right pleural fluid collection with associated atelectasis.   Electronically Signed   By: Marin Olp M.D.   On: 05/11/2014 17:02   Dg Chest 1 View  05/14/2014   CLINICAL DATA:  Post attempted left-sided thoracentesis  EXAM: CHEST  1 VIEW  COMPARISON:  Earlier same day ; 05/13/2014; 05/10/2014; CT abdomen pelvis - 05/11/2014  FINDINGS: Grossly unchanged borderline enlarged cardiac silhouette and mediastinal contours with persistent obscuration of left heart border secondary to grossly unchanged small loculated left-sided effusion with associated left basilar consolidative opacities. No new focal airspace opacities. No pneumothorax. No evidence of edema.  No acute osseus abnormalities. Percutaneous drainage catheter overlies the left upper abdominal quadrant.  IMPRESSION: 1. Unchanged small partially loculated left-sided effusion post attempted thoracentesis. No pneumothorax. 2. Grossly unchanged consolidative opacities within the left lower lung.   Electronically Signed   By: Sandi Mariscal M.D.   On: 05/14/2014 15:45   Dg Chest 1 View  05/10/2014   CLINICAL DATA:  Status post thoracentesis  EXAM: CHEST  1 VIEW  COMPARISON:  Chest radiograph and chest CT May 09, 2014  FINDINGS: No pneumothorax. Left effusion smaller following thoracentesis. There is some residual left effusion with left base atelectasis.  There is a chest tube in the left subpleural region as well as a pigtail catheter in the left upper abdomen.  Right lung is clear. Heart is mildly enlarged with pulmonary vascularity, stable.  IMPRESSION: Left effusion smaller following thoracentesis. There is a small left effusion with left base atelectasis. Subpulmonic chest tube remains on the left as does a drain in the upper abdomen on the left. Right lung is clear. No change in cardiac silhouette. No demonstrable pneumothorax.   Electronically Signed   By: Lowella Grip III M.D.   On: 05/10/2014 14:13   Dg Chest 2 View  05/14/2014   CLINICAL DATA:  Followup pleural effusion.  EXAM: CHEST  2 VIEW  COMPARISON:  Chest x-ray 05/13/2014 and CT abdomen 05/11/2014.  FINDINGS: Persistent small left pleural effusion with overlying atelectasis. The right lung remains clear. Stable left upper quadrant drainage catheter.  IMPRESSION: Persistent left pleural effusion with overlying atelectasis.   Electronically Signed   By: Marijo Sanes M.D.   On: 05/14/2014 14:10   Dg Chest 2 View  05/13/2014   CLINICAL DATA:  Pancreatic cancer, shortness of breath, pleural effusions, weakness  EXAM: CHEST  2 VIEW  COMPARISON:  05/10/2014  FINDINGS: Pigtail drainage catheter LEFT upper quadrant.  Enlargement of cardiac silhouette.  Atherosclerotic calcification aorta.  Mediastinal contours and pulmonary vascularity otherwise normal.  Increased LEFT pleural effusion and basilar atelectasis since previous exam.  Remaining lungs clear.  No pneumothorax.  Bones demineralized.  IMPRESSION: Increased LEFT pleural effusion and basilar atelectasis.  Enlargement of cardiac silhouette.   Electronically Signed   By: Lavonia Dana M.D.   On: 05/13/2014 14:11   Dg Chest 2 View  05/09/2014   CLINICAL DATA:  69 year old female with shortness of breath and chest pain. Initial encounter.  Personal history of distal pancreatectomy and splenectomy.  EXAM: CHEST  2 VIEW  COMPARISON:  Chest and  abdominal series 4 /09/2014. CT Abdomen and Pelvis 04/29/2014, portable chest 04/17/2014.  FINDINGS: Semi upright AP and lateral views of the chest. Left pleural effusion has progressed and is moderate to large. Increased opacification at the left lung base. Lower lung volumes.  Stable cardiac size and mediastinal contours. No pneumothorax or pulmonary edema. Grossly clear right lung. Visualized tracheal air column is within normal limits.  Left abdomen percutaneous drains remain in place. Partially visible IVC filter.  IMPRESSION: 1. Progressed left pleural effusion, now moderate to large. Associated decreased ventilation in the left lung. 2. Lower lung volumes.   Electronically Signed   By: Genevie Ann M.D.   On: 05/09/2014 08:49   Ct Chest W Contrast  05/18/2014   CLINICAL DATA:  Loculated left pleural effusion, chest pain.  EXAM: CT CHEST WITH CONTRAST  TECHNIQUE: Multidetector CT imaging of the chest was performed during intravenous contrast administration.  CONTRAST:  60mL OMNIPAQUE IOHEXOL 300 MG/ML  SOLN  COMPARISON:  CT scan of May 09, 2014.  FINDINGS: No pneumothorax is noted. Mild to moderate left pleural effusion is noted with associated atelectasis of the left lower lobe. The superior portion of this effusion appears to be loculated. There is no evidence of thoracic aortic dissection or aneurysm. Visualized portions of pulmonary arteries appear normal. No mediastinal mass or adenopathy is noted no significant osseous abnormality is noted. Percutaneous drain is again noted between the stomach and left kidney.  IMPRESSION: Mild to moderate left pleural effusion is noted with adjacent atelectasis of the left lower lobe. The smaller superior portion of this effusion appears to be loculated. This appears to be smaller compared to prior exam.   Electronically Signed   By: Marijo Conception, M.D.   On: 05/18/2014 12:13   Ct Angio Chest Pe W/cm &/or Wo Cm  05/09/2014   CLINICAL DATA:  Shortness of breath,  chest pain.  EXAM: CT ANGIOGRAPHY CHEST WITH CONTRAST  TECHNIQUE: Multidetector CT imaging of the chest was performed using the standard protocol during bolus administration of intravenous contrast. Multiplanar CT image reconstructions and MIPs were obtained to evaluate the vascular anatomy.  CONTRAST:  157mL OMNIPAQUE IOHEXOL 350 MG/ML SOLN  COMPARISON:  CT scan of April 29, 2014.  FINDINGS: No pneumothorax is noted. Moderate left pleural effusion is noted with associated atelectasis of the left lower lobe. Right lung is clear. There is no evidence of thoracic aortic dissection or aneurysm. There is no definite evidence of pulmonary embolus. No mediastinal mass or adenopathy is noted. No significant osseous abnormality is noted. Continued presence of drainage catheter seen between the stomach and left kidney. Continued presence of fluid collection seen medial to proximal stomach.  Review of the MIP images confirms the above findings.  IMPRESSION: No evidence of pulmonary embolus.  Moderate left pleural effusion is noted with associated atelectasis of the left lower lobe.  Continued presence of drainage catheter seen between the stomach and left kidney, as well as continued and stable appearance of fluid collection medial to proximal stomach. Please refer to report of CT scan of April 29, 2014.   Electronically Signed   By: Marijo Conception, M.D.   On: 05/09/2014 16:56   Ir Sinus/fist Tube Chk-non Gi  05/17/2014   CLINICAL DATA:  Pancreatic carcinoma, post distal pancreatectomy and splenectomy with persistent leak. Surgical drain remains in place into pigtail catheter had been placed into a retroperitoneal collection ; output has tapered off.  EXAM: RETROPERITONEAL DRAIN CATHETER INJECTION UNDER FLUOROSCOPY  TECHNIQUE: The procedure, risks (including but not limited to bleeding, infection, organ damage ), benefits, and alternatives were explained to the patient. Questions regarding the procedure were encouraged  and answered. The patient understands and consents to the procedure.  Survey fluoroscopic inspection reveals stable position of pigtail catheter in the left upper abdomen and surgical drain to the subphrenic space. A small left pleural effusion is noted.  Injection of the left upper quadrant percutaneous pigtail drain catheter demonstrates filling of a small irregular cavity around the drain catheter. With continued injection, contrast tracks back along the catheter and exits the skin entry site. There is no opacification of the pancreatic duct or biliary tree. With aspiration, most of the injected contrast can be removed.  After telephone consultation with Dr. Barry Dienes, the decision was made to proceed with injection of the left lateral surgical drain catheter. However, the catheter is occluded such that injection using a moderately vigorous pressure did not result in any  contrast entering the drain catheter. Inspection demonstrates that the retention sutures is not occlusive. High pressure injection was deferred. Both catheters were returned to bulb drainage.  The patient tolerated the procedure well. No immediate complication.  IMPRESSION: 1. Small residual cavity around the pigtail drain catheter, with NO communication to the pancreatic duct or biliary tree opacified. 2. Occlusion of surgical drain catheter.   Electronically Signed   By: Lucrezia Europe M.D.   On: 05/17/2014 15:03   Dg Abd Acute W/chest  05/08/2014   CLINICAL DATA:  Left upper quadrant and left lower quadrant abdominal pain.  EXAM: DG ABDOMEN ACUTE W/ 1V CHEST  COMPARISON:  04/29/2014  FINDINGS: Two surgical drains are identified within the left upper quadrant of the abdomen. These are similar in position to 3/30 1/16. There is an IVC filter in place and there are surgical clips from prior cholecystectomy. There is no evidence of dilated bowel loops or free intraperitoneal air. No radiopaque calculi or other significant radiographic abnormality is  seen. Heart size and mediastinal contours are within normal limits. Both lungs are clear. Moderate left pleural effusion is again noted.  IMPRESSION: 1. Nonobstructive bowel gas pattern. 2. Left pleural effusion 3. Similar position of left upper quadrant surgical drains.   Electronically Signed   By: Kerby Moors M.D.   On: 05/08/2014 18:27   US Thoracentesis Asp Pleural Space W/img Guide  05/14/2014   INDICATION: Symptomatic left sided pleural effusion  EXAM: US THORACENTESIS ASP PLEURAL SPACE W/IMG GUIDE  COMPARISON:  Chest radiograph - earlier same day; 05/13/2014; 05/10/2014; CT abdomen pelvis - 05/11/2014  MEDICATIONS: None  COMPLICATIONS: None immediate  TECHNIQUE: Informed written consent was obtained from the patient after a discussion of the risks, benefits and alternatives to treatment. A timeout was performed prior to the initiation of the procedure.  Initial ultrasound scanning demonstrates a small pleural effusion with multiple internal echogenic septations and apparent tethering of the left lower lobe (representative images 1 through 4). The lower chest was prepped and draped in the usual sterile fashion. 1% lidocaine was used for local anesthesia.  A 5 [place] Yueh sheath needle was advanced into an anechoic component left-sided pleural effusion however only yielded approximately 2 cc of fluid. Yueh sheath needle was then advanced into a separate anechoic component of the left-sided pleural effusion however only approximately 8 cc of fluid was able to be aspirated. Multiple ultrasound images were saved for documentation purposes.  The catheter was removed and a dressing was applied. The patient tolerated the procedure well without immediate post procedural complication. The patient was escorted to have an upright chest radiograph.  FINDINGS: A total of approximately 10 cc of blood tinged serous fluid was removed.  IMPRESSION: Successful ultrasound-guided left sided thoracentesis yielding only 10  cc liters of pleural fluid secondary to marked loculation of the effusion.   Electronically Signed   By: Sandi Mariscal M.D.   On: 05/14/2014 16:06   US Thoracentesis Asp Pleural Space W/img Guide  05/10/2014   INDICATION: Symptomatic left sided pleural effusion  EXAM: US THORACENTESIS ASP PLEURAL SPACE W/IMG GUIDE  COMPARISON:  CXR 05/09/14.  MEDICATIONS: None  COMPLICATIONS: None immediate  TECHNIQUE: Informed written consent was obtained from the patient after a discussion of the risks, benefits and alternatives to treatment. A timeout was performed prior to the initiation of the procedure.  Initial ultrasound scanning demonstrates a left pleural effusion. The lower chest was prepped and draped in the usual sterile fashion. 1% lidocaine  was used for local anesthesia.  Under direct ultrasound guidance, a 19 gauge, 10-cm, Yueh catheter was introduced. An ultrasound image was saved for documentation purposes. The thoracentesis was performed. The catheter was removed and a dressing was applied. The patient tolerated the procedure well without immediate post procedural complication. The patient was escorted to have an upright chest radiograph.  FINDINGS: A total of approximately 500 ml of blood tinged fluid was removed. Requested samples were sent to the laboratory.  IMPRESSION: Successful ultrasound-guided left sided thoracentesis yielding 500 ml of pleural fluid.  Read By:  Tsosie Billing PA-C   Electronically Signed   By: Sandi Mariscal M.D.   On: 05/10/2014 14:14    DISCHARGE EXAMINATION: Filed Vitals:   05/20/14 1418 05/20/14 1724 05/20/14 2125 05/21/14 0521  BP: 97/52 106/60 113/67 115/65  Pulse: 98  102 92  Temp: 98 F (36.7 C)  98.5 F (36.9 C) 98.4 F (36.9 C)  TempSrc: Oral  Oral Oral  Resp: 20  20 20   Height:      Weight:    85.684 kg (188 lb 14.4 oz)  SpO2: 99%  99% 99%   General appearance: alert, cooperative, appears stated age and no distress Resp: clear to auscultation  bilaterally Cardio: regular rate and rhythm, S1, S2 normal, no murmur, click, rub or gallop GI: soft, non-tender; bowel sounds normal; no masses,  no organomegaly  DISPOSITION: skilled nursing facility  Discharge Instructions    Body fluid culture with gram stain    Complete by:  As directed      Body fluid culture with gram stain    Complete by:  As directed      Call MD for:  difficulty breathing, headache or visual disturbances    Complete by:  As directed      Call MD for:  extreme fatigue    Complete by:  As directed      Call MD for:  persistant dizziness or light-headedness    Complete by:  As directed      Call MD for:  persistant nausea and vomiting    Complete by:  As directed      Call MD for:  severe uncontrolled pain    Complete by:  As directed      Call MD for:  temperature >100.4    Complete by:  As directed      Diet - low sodium heart healthy    Complete by:  As directed      Discharge instructions    Complete by:  As directed   Drain care: once daily irrigation with 5-10 cc's sterile NS, recording of output and daily dressing changes.  Dr. Barry Dienes to determine duration of antibiotics. Plan is for 3-6 weeks depending on clinical progress.  Voiding trial in 1 week once she is more mobile. If she still fails the trial please arrange follow up with Urology (Dr. Junious Silk).   You were cared for by a hospitalist during your hospital stay. If you have any questions about your discharge medications or the care you received while you were in the hospital after you are discharged, you can call the unit and asked to speak with the hospitalist on call if the hospitalist that took care of you is not available. Once you are discharged, your primary care physician will handle any further medical issues. Please note that NO REFILLS for any discharge medications will be authorized once you are discharged, as it is imperative that you return  to your primary care physician (or  establish a relationship with a primary care physician if you do not have one) for your aftercare needs so that they can reassess your need for medications and monitor your lab values. If you do not have a primary care physician, you can call [phone] for a physician referral.     Increase activity slowly    Complete by:  As directed            ALLERGIES:  Allergies  Allergen Reactions  . Sulfa Antibiotics Swelling     Current Discharge Medication List    START taking these medications   Details  acetaminophen (TYLENOL) 325 MG tablet Take 2 tablets (650 mg total) by mouth every 6 (six) hours as needed for mild pain or fever.    alum & mag hydroxide-simeth (MAALOX/MYLANTA) 200-200-20 MG/5ML suspension Take 30 mLs by mouth every 6 (six) hours as needed for indigestion or heartburn (or bloating). Qty: 355 mL, Refills: 0    Alum & Mag Hydroxide-Simeth (MAGIC MOUTHWASH) SOLN Take 15 mLs by mouth 4 (four) times daily as needed for mouth pain (sore throat). Refills: 0    bismuth subsalicylate (PEPTO BISMOL) 262 MG/15ML suspension Take 30 mLs by mouth every 8 (eight) hours as needed for indigestion or diarrhea or loose stools. Qty: 360 mL, Refills: 0    ciprofloxacin (CIPRO) 500 MG tablet Take 1 tablet (500 mg total) by mouth 2 (two) times daily.    HYDROcodone-acetaminophen (NORCO) 10-325 MG per tablet Take 0.5-1 tablets by mouth every 6 (six) hours as needed for moderate pain or severe pain. Qty: 30 tablet, Refills: 0    metoprolol succinate (TOPROL-XL) 25 MG 24 hr tablet Take 0.5 tablets (12.5 mg total) by mouth daily.    metroNIDAZOLE (FLAGYL) 500 MG tablet Take 1 tablet (500 mg total) by mouth every 8 (eight) hours.    psyllium (HYDROCIL/METAMUCIL) 95 % PACK Take 1 packet by mouth daily. Qty: 56 each    saccharomyces boulardii (FLORASTOR) 250 MG capsule Take 1 capsule (250 mg total) by mouth 2 (two) times daily.      CONTINUE these medications which have CHANGED   Details   enoxaparin (LOVENOX) 120 MG/0.8ML injection Inject 0.86 mLs (130 mg total) into the skin daily. Qty: 30 Syringe, Refills: 0   Associated Diagnoses: Primary cancer of tail of pancreas; History of DVT (deep vein thrombosis)    loperamide (IMODIUM) 2 MG capsule Take 1 capsule (2 mg total) by mouth 3 (three) times daily as needed for diarrhea or loose stools. Qty: 30 capsule, Refills: 0    pantoprazole (PROTONIX) 40 MG tablet Take 1 tablet (40 mg total) by mouth 2 (two) times daily before a meal. Qty: 30 tablet, Refills: 2      CONTINUE these medications which have NOT CHANGED   Details  atorvastatin (LIPITOR) 40 MG tablet Take 1 tablet (40 mg total) by mouth daily. Qty: 90 tablet, Refills: 0   Associated Diagnoses: Essential hypertension    brimonidine-timolol (COMBIGAN) 0.2-0.5 % ophthalmic solution Place 1 drop into the left eye every 12 (twelve) hours.    diltiazem (CARTIA XT) 120 MG 24 hr capsule Take 1 capsule (120 mg total) by mouth daily. Qty: 90 capsule, Refills: 0   Associated Diagnoses: Essential hypertension    latanoprost (XALATAN) 0.005 % ophthalmic solution Place 1 drop into both eyes at bedtime.    primidone (MYSOLINE) 50 MG tablet Take 50 mg by mouth at bedtime. Patient takes at [employer]  sucralfate (CARAFATE) 1 GM/10ML suspension Take 10 mLs (1 g total) by mouth 4 (four) times daily -  with meals and at bedtime. Qty: 420 mL, Refills: 0    tamsulosin (FLOMAX) 0.4 MG CAPS capsule Take 1 capsule (0.4 mg total) by mouth daily. Qty: 30 capsule, Refills: 0      STOP taking these medications     DOXYCYCLINE HYCLATE PO      propranolol ER (INDERAL LA) 120 MG 24 hr capsule      traMADol (ULTRAM) 50 MG tablet        Follow-up Information    Follow up with Mauricio Po, FNP. Go on 06/08/2014.   Specialty:  Family Medicine   Why:  at 1:00pm   for Post Hospitalization Follow up.  Arrive 15 minutes prior to appointment and bring current list of medications and  insurance info   Contact information:   [place] [address] [phone]       Follow up with [name], MD.   Specialty:  General Surgery   Why:  [place] office will call you with an appointment date and time   Contact information:   [place] [address] [phone]       Follow up with Festus Aloe, MD.   Specialty:  Urology   Why:  [place] office will call you to set up an appointment    Contact information:   [place] [address] [phone]       TOTAL DISCHARGE TIME: 68 minutes  [facility] Pager [phone]  05/21/2014, 11:21 AM

## 2014-05-21 NOTE — Progress Notes (Signed)
ANTICOAGULATION CONSULT NOTE - Follow Up  Pharmacy Consult for Lovenox Indication: hx PE and recurrent DVTs  Allergies  Allergen Reactions  . Sulfa Antibiotics Swelling    Patient Measurements: Height: 5' 9.5" (176.5 cm) Weight: 188 lb 14.4 oz (85.684 kg) IBW/kg (Calculated) : 67.35  Vital Signs: Temp: 98.4 F (36.9 C) (04/22 0521) Temp Source: Oral (04/22 0521) BP: 115/65 mmHg (04/22 0521) Pulse Rate: 92 (04/22 0521)  Labs:  Recent Labs  05/19/14 0434 05/21/14 0518  HGB 8.7* 8.7*  HCT 28.3* 27.8*  PLT 528* 527*  CREATININE 0.85 0.78    Estimated Creatinine Clearance: 80.5 mL/min (by C-G formula based on Cr of 0.78).  Assessment: 72 YOF presented 05/08/14 with lower left-sided abdominal pain s/p complex abdominal surgery (pancreas, spleen, colon, & kidney). 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 Lovenox transitioned to heparin on admit in anticipation of thoracentesis due to chest x-ray confirmation of pleural effusion on 05/09/14. Chest CT angio on 4/10 negative for new PE. S/p thoracentesis on 05/14/14. With no further surgical interventions planned, transitioned patient back to Lovenox 05/12/14.  Today, 05/21/2014   Weight is stable.   CBC: H/H low, but stable. Hgb 8.7, PLTs elevated, but decreased to 527  Renal function improved- Scr 0.78 mg/dL and CrCl ~80 mL/min No bleeding/complications noted  Goal of Therapy:  Appropriate anticoagulation for history of DVT    Plan:  -Continue Lovenox 130 mg SQ q24h (~1.5 mg/kg) -Note increase in dose for weight change from the patient's PTA dose of 120mg .  -She will need a new prescription for 150mg  syringes at discharge.   Gloriajean Dell, PharmD Candidate

## 2014-05-21 NOTE — Progress Notes (Signed)
Patient ID: [name], female   DOB: 1945/02/12, 69 y.o.   MRN: [id number]   [facility]  [address] [place], [place] [address] Phone: [phone] FAX: [phone]    [name] [id number] May 22, 1945  CARE TEAM:  PCP: Mauricio Po, [place]  Outpatient Care Team: Patient Care Team: Golden Circle, FNP as PCP - General (Family Medicine) Carol Ada, MD as Consulting Physician (Gastroenterology) Truitt Merle, MD as Consulting Physician (Hematology) [name], MD as Consulting Physician (General Surgery)  Inpatient Treatment Team: Treatment Team: Attending Provider: Bonnielee Haff, MD; Rounding Team: Redmond Baseman, MD; Registered Nurse: Alfonse Spruce, RN; Consulting Physician: [name], MD; Technician: Lucila Maine, NT; Registered Nurse: Beryle Quant, RN; Technician: Mertha Baars, NT; Consulting Physician: Grace Isaac, MD; Consulting Physician: Michae Kava Lbcardiology, MD; Technician: Desma Mcgregor; Registered Nurse: Layla Maw, RN; Physical Therapist: Diego Cory, PTA; Respiratory Therapist: Romeo Apple, RRT; Technician: Birdena Jubilee, NT  Problem List:   Principal Problem:   Pleural effusion Active Problems:   Hx pulmonary embolism   HTN (hypertension)   DVT (deep venous thrombosis)   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  Loculated left pleural effusion and chest pain s/p drainage & Abx  Plan: D/c drain #2.  Continue diet as tolerated. Nutrition key - check nutrition labs.  Urinary retention - catheter.   Follow up with urology.    Bowel regimen w probiotics.  -VTE prophylaxis- SCDs, etc  -Mobilize as tolerated to help recovery.    Home per triad when diet/mobility stable.   Keep drain #1, discuss with radiology.    Subjective: Continues to improve.    Objective:  Vital signs:  Filed Vitals:   05/20/14 1418 05/20/14 1724 05/20/14 2125 05/21/14 0521  BP: 97/52 106/60 113/67 115/65  Pulse: 98  102 92  Temp: 98 F (36.7 C)  98.5 F (36.9 C) 98.4 F (36.9 C)  TempSrc: Oral  Oral Oral  Resp: 20  20 20   Height:      Weight:    85.684 kg (188 lb 14.4 oz)  SpO2: 99%  99% 99%    Last BM Date: 05/21/14  Intake/Output   Yesterday:  04/21 0701 - 04/22 0700 In: 62 [P.O.:480] Out: 631 [Urine:625; Drains:5; Stool:1]  Bowel function:  Flatus: y  BM: y  Drain: scant w green tinge  Physical Exam:  General: Pt awake/alert/oriented x4 in no acute distress Eyes: Sclera clear.  No icterus Psych:  No delerium/psychosis/paranoia.Talkative. HENT: Normocephalic. CV: Tachycardic. Regular rhythm. Resp: Breathing non-labored. Abdomen: Soft.  Nondistended. Nontender.  No evidence of peritonitis.  No incarcerated hernias. Drain #1 still greenish.   Ext:  SCDs BLE.  No major edema.  No cyanosis Skin: No petechiae / purpura  Results:   Labs: Results for orders placed or performed during the hospital encounter of 05/09/14 (from the past 48 hour(s))  CBC     Status: Abnormal   Collection Time: 05/21/14  5:18 AM  Result Value Ref Range   WBC 7.1 4.0 - 10.5 K/uL   RBC 2.99 (L) 3.87 -  5.11 MIL/uL   Hemoglobin 8.7 (L) 12.0 - 15.0 g/dL   HCT 27.8 (L) 36.0 - 46.0 %   MCV 93.0 78.0 - 100.0 fL   MCH 29.1 26.0 - 34.0 pg   MCHC 31.3 30.0 - 36.0 g/dL   RDW 16.5 (H) 11.5 - 15.5 %   Platelets 527 (H) 150 - 400 K/uL  Basic metabolic panel     Status: Abnormal   Collection Time: 05/21/14  5:18 AM  Result Value Ref Range   Sodium 137 135 - 145 mmol/L   Potassium 3.4 (L) 3.5 - 5.1 mmol/L    Chloride 106 96 - 112 mmol/L   CO2 22 19 - 32 mmol/L   Glucose, Bld 92 70 - 99 mg/dL   BUN 8 6 - 23 mg/dL   Creatinine, Ser 0.78 0.50 - 1.10 mg/dL   Calcium 8.6 8.4 - 10.5 mg/dL   GFR calc non Af Amer 85 (L) >90 mL/min   GFR calc Af Amer >90 >90 mL/min    Comment: (NOTE) The eGFR has been calculated using the CKD EPI equation. This calculation has not been validated in all clinical situations. eGFR's persistently <90 mL/min signify possible Chronic Kidney Disease.    Anion gap 9 5 - 15    Imaging / Studies: No results found.  Medications / Allergies: per chart  Antibiotics: Anti-infectives    Start     Dose/Rate Route Frequency Ordered Stop   05/16/14 2000  ciprofloxacin (CIPRO) tablet 500 mg     500 mg Oral 2 times daily 05/16/14 1624     05/16/14 1700  metroNIDAZOLE (FLAGYL) tablet 500 mg     500 mg Oral 3 times per day 05/16/14 1607     05/10/14 2000  ciprofloxacin (CIPRO) IVPB 400 mg  Status:  Discontinued     400 mg 200 mL/hr over 60 Minutes Intravenous Every 12 hours 05/10/14 1758 05/16/14 1624   05/10/14 1800  piperacillin-tazobactam (ZOSYN) IVPB 3.375 g  Status:  Discontinued     3.375 g 12.5 mL/hr over 240 Minutes Intravenous Every 8 hours 05/10/14 1743 05/16/14 1607   05/10/14 1100  doxycycline (VIBRA-TABS) tablet 100 mg  Status:  Discontinued     100 mg Oral 2 times daily 05/10/14 1008 05/11/14 2251      [name], PA-S [facility] Surgery [phone] 05/21/2014 8:54 AM

## 2014-05-21 NOTE — Clinical Social Work Placement (Signed)
Patient is set to discharge to [facility] today. Patient & daughter, Anderia aware. Discharge packet given to RN, Robert Bellow. Daughter to transport to SNF.     Raynaldo Opitz, [facility] Clinical Social Worker cell #: [phone]    CLINICAL SOCIAL WORK PLACEMENT  NOTE  Date:  05/21/2014  Patient Details  Name: [name] MRN: [id number] Date of Birth: 1945/11/13  Clinical Social Work is seeking post-discharge placement for this patient at the [place] level of care (*CSW will initial, date and re-position this form in  chart as items are completed):  Yes   Patient/family provided with [place] Work Department's list of facilities offering this level of care within the geographic area requested by the patient (or if unable, by the patient's family).  Yes   Patient/family informed of their freedom to choose among providers that offer the needed level of care, that participate in Medicare, Medicaid or managed care program needed by the patient, have an available bed and are willing to accept the patient.  Yes   Patient/family informed of [facility]'s ownership interest in [facility] and [facility], as well as of the fact that they are under no obligation to receive care at these facilities.  PASRR submitted to EDS on 05/21/14     PASRR number received on 05/21/14     Existing PASRR number confirmed on       FL2 transmitted to all facilities in geographic area requested by pt/family on 05/21/14     FL2 transmitted to all facilities within larger geographic area on       Patient informed that his/her managed care company has contracts with or will negotiate with certain facilities, including the following:        Yes   Patient/family informed of bed offers received.  Patient chooses bed at [facility]     Physician recommends and patient chooses bed at      Patient to be transferred to [facility] on  05/21/14.  Patient to be transferred to facility by patient's daughter, Betsi     Patient family notified on   of transfer.  Name of family member notified:  patient's daughter, Summers      PHYSICIAN       Additional Comment:    _______________________________________________ Standley Brooking, LCSW 05/21/2014, 11:46 AM

## 2014-05-21 NOTE — Progress Notes (Signed)
Report called to Maudie Mercury at [place]. Hospital course and discharge reviewed. Follow up appointments discussed and all questions answered. Callie Fielding RN

## 2014-05-21 NOTE — Discharge Instructions (Signed)
Pleural Effusion °The lining covering your lungs and the inside of your chest is called the pleura. Usually, the space between the two pleura contains no air and only a thin layer of fluid. A pleural effusion is an abnormal buildup of fluid in the pleural space. °Fluid gathers when there is increased pressure in the lung vessels. This forces fluids out of the lungs and into the pleural space. Vessels may also leak fluids when there are infections, such as pneumonia, or other causes of soreness and redness (inflammation). Fluids leak into the lungs when protein in the blood is low or when certain vessels (lymphatics) are blocked. °Finding a pleural effusion is important because it is usually caused by another disease. In order to treat a pleural effusion, your health care provider needs to find its cause. If left untreated, a large amount of fluid can build up and cause collapse of the lung. °CAUSES  °· Heart failure. °· Infections (pneumonia, tuberculosis), pulmonary embolism, pulmonary infarction. °· Cancer (primary lung and metastatic), asbestosis. °· Liver failure (cirrhosis). °· Nephrotic syndrome, peritoneal dialysis, kidney problems (uremia). °· Collagen vascular disease (systemic lupus erythematosus, rheumatoid arthritis). °· Injury (trauma) to the chest or rupture of the digestive tube (esophagus). °· Material in the chest or pleural space (hemothorax, chylothorax). °· Pancreatitis. °· Surgery. °· Drug reactions. °SYMPTOMS  °A pleural effusion can decrease the amount of space available for breathing and make you short of breath. The fluid can become infected, which may cause pain and fever. Often, the pain is worse when taking a deep breath. The underlying disease (heart failure, pneumonia, blood clot, tuberculosis, cancer) may also cause symptoms. °DIAGNOSIS  °· Your health care provider can usually tell what is wrong by talking to you (taking a history), doing an exam, and taking a routine X-ray. If the  X-ray shows fluid in your chest, often fluid is removed from your chest with a needle for testing (diagnostic thoracentesis). °· Sometimes, more specialized X-rays may be needed. °· Sometimes, a small piece of tissue is removed and examined by a specialist (biopsy). °TREATMENT  °Treatment varies based on what caused the pleural effusion. Treatments include: °· Removing as much fluid as possible using a needle (thoracentesis) to improve the cough and shortness of breath. This is a simple procedure that can be done at bedside. The risks are bleeding, infection, collapse of a lung, or low blood pressure. °· Placing a tube in the chest to drain the effusion (tube thoracostomy). This is often used when there is an infection in the fluid. This is a simple procedure that can often be done at bedside or in a clinic. The procedure may be painful. The risks are the same as using a needle to drain the fluid. The chest tube usually remains for a few days and is connected to suction to improve fluid drainage. After placement, the tube usually does not cause much discomfort. °· Surgical removal of fibrous debris in and around the pleural space (decortication). This may be done with a flexible telescope (thoracoscope) through a small or large cut (incision). This is helpful for patients who have fibrosis or scar tissue that prevents complete lung expansion. The risks are infection, blood loss, and side effects from general anesthesia. °· Sometimes, a procedure called pleurodesis is done. A chest tube is placed and the fluid is drained. Next, an agent (tetracycline, talc powder) is added to the pleural space. This causes the lung and chest wall to stick together (adhesion). This leaves no   potential space for fluid to build up. The risks include infection, blood loss, and side effects from general anesthesia. °· If the effusion is caused by infection, it may be treated with antibiotics and may improve without draining. °HOME CARE  INSTRUCTIONS  °· Take any medicines exactly as prescribed. °· Follow up with your health care provider as directed. °· Monitor your exercise capacity (the amount of walking you can do before you get short of breath). °· Do not use any tobacco products including cigarettes, chewing tobacco, or electronic cigarettes. °SEEK MEDICAL CARE IF:  °· Your exercise capacity seems to get worse or does not improve with time. °· You do not recover from your illness. °· You have drainage, redness, swelling, or pain at any incision or puncture sites. °SEEK IMMEDIATE MEDICAL CARE IF:  °· Shortness of breath or chest pain develops or gets worse. °· You have a fever. °· You develop a new cough, especially if the mucus (phlegm) is discolored. °MAKE SURE YOU:  °· Understand these instructions. °· Will watch your condition. °· Will get help right away if you are not doing well or get worse. °Document Released: 01/15/2005 Document Revised: 06/01/2013 Document Reviewed: 09/06/2006 °ExitCare® Patient Information ©2015 [employer]. This information is not intended to replace advice given to you by your health care provider. Make sure you discuss any questions you have with your health care provider. ° °

## 2014-05-24 ENCOUNTER — Other Ambulatory Visit: Payer: Self-pay | Admitting: *Deleted

## 2014-05-24 MED ORDER — HYDROCODONE-ACETAMINOPHEN 10-325 MG PO TABS: ORAL_TABLET | ORAL | Status: DC

## 2014-05-24 NOTE — Telephone Encounter (Signed)
[facility] 

## 2014-05-25 ENCOUNTER — Other Ambulatory Visit (HOSPITAL_COMMUNITY): Payer: Self-pay | Admitting: Radiology

## 2014-05-25 ENCOUNTER — Ambulatory Visit (HOSPITAL_COMMUNITY)
Admission: RE | Admit: 2014-05-25 | Discharge: 2014-05-25 | Disposition: A | Discharge status: Home / Self Care | Payer: Medicare Other | Source: Ambulatory Visit | Attending: Interventional Radiology | Admitting: Interventional Radiology

## 2014-05-25 ENCOUNTER — Non-Acute Institutional Stay (SKILLED_NURSING_FACILITY): Payer: Medicare Other | Admitting: Internal Medicine

## 2014-05-25 ENCOUNTER — Ambulatory Visit (HOSPITAL_COMMUNITY)
Admit: 2014-05-25 | Discharge: 2014-05-25 | Disposition: A | Discharge status: Home / Self Care | Payer: Medicare Other | Attending: Interventional Radiology | Admitting: Interventional Radiology

## 2014-05-25 DIAGNOSIS — C259 Malignant neoplasm of pancreas, unspecified: Secondary | ICD-10-CM

## 2014-05-25 DIAGNOSIS — IMO0001 Reserved for inherently not codable concepts without codable children: Secondary | ICD-10-CM

## 2014-05-25 DIAGNOSIS — R339 Retention of urine, unspecified: Secondary | ICD-10-CM | POA: Diagnosis not present

## 2014-05-25 DIAGNOSIS — E876 Hypokalemia: Secondary | ICD-10-CM | POA: Diagnosis not present

## 2014-05-25 DIAGNOSIS — K651 Peritoneal abscess: Secondary | ICD-10-CM | POA: Diagnosis not present

## 2014-05-25 DIAGNOSIS — J90 Pleural effusion, not elsewhere classified: Secondary | ICD-10-CM | POA: Diagnosis not present

## 2014-05-25 DIAGNOSIS — T814XXD Infection following a procedure, subsequent encounter: Secondary | ICD-10-CM

## 2014-05-26 ENCOUNTER — Encounter: Payer: Self-pay | Admitting: *Deleted

## 2014-05-26 NOTE — Progress Notes (Signed)
Dr. Barry Dienes will be at [facility] on 06/04/14 for GI [facility] and wants to see [name] in follow up that day at 10:00. Report at front desk you are seeing Dr. Barry Dienes and you do not need to register since you are seeing a [facility] physician that day. Will provide patient that information at her appointment on 4/28 with [name].

## 2014-05-27 ENCOUNTER — Other Ambulatory Visit (HOSPITAL_COMMUNITY): Payer: Self-pay | Admitting: Radiology

## 2014-05-27 ENCOUNTER — Encounter: Payer: Self-pay | Admitting: *Deleted

## 2014-05-27 ENCOUNTER — Ambulatory Visit (HOSPITAL_BASED_OUTPATIENT_CLINIC_OR_DEPARTMENT_OTHER): Payer: Medicare Other | Admitting: Hematology

## 2014-05-27 ENCOUNTER — Ambulatory Visit (HOSPITAL_COMMUNITY)
Admission: RE | Admit: 2014-05-27 | Discharge: 2014-05-27 | Disposition: A | Discharge status: Home / Self Care | Payer: Medicare Other | Source: Ambulatory Visit | Attending: Diagnostic Radiology | Admitting: Diagnostic Radiology

## 2014-05-27 ENCOUNTER — Encounter: Payer: Self-pay | Admitting: Hematology

## 2014-05-27 ENCOUNTER — Other Ambulatory Visit (HOSPITAL_BASED_OUTPATIENT_CLINIC_OR_DEPARTMENT_OTHER): Payer: Medicare Other

## 2014-05-27 ENCOUNTER — Telehealth: Payer: Self-pay | Admitting: Hematology

## 2014-05-27 VITALS — BP 120/57 | HR 100 | Temp 98.1°F | Resp 18 | Ht 69.5 in | Wt 185.0 lb

## 2014-05-27 DIAGNOSIS — Z86718 Personal history of other venous thrombosis and embolism: Secondary | ICD-10-CM

## 2014-05-27 DIAGNOSIS — M479 Spondylosis, unspecified: Secondary | ICD-10-CM | POA: Insufficient documentation

## 2014-05-27 DIAGNOSIS — Z8507 Personal history of malignant neoplasm of pancreas: Secondary | ICD-10-CM | POA: Diagnosis not present

## 2014-05-27 DIAGNOSIS — M170 Bilateral primary osteoarthritis of knee: Secondary | ICD-10-CM | POA: Diagnosis not present

## 2014-05-27 DIAGNOSIS — K219 Gastro-esophageal reflux disease without esophagitis: Secondary | ICD-10-CM | POA: Diagnosis not present

## 2014-05-27 DIAGNOSIS — I82531 Chronic embolism and thrombosis of right popliteal vein: Secondary | ICD-10-CM | POA: Diagnosis not present

## 2014-05-27 DIAGNOSIS — K5790 Diverticulosis of intestine, part unspecified, without perforation or abscess without bleeding: Secondary | ICD-10-CM | POA: Insufficient documentation

## 2014-05-27 DIAGNOSIS — Z86711 Personal history of pulmonary embolism: Secondary | ICD-10-CM | POA: Insufficient documentation

## 2014-05-27 DIAGNOSIS — G250 Essential tremor: Secondary | ICD-10-CM | POA: Diagnosis not present

## 2014-05-27 DIAGNOSIS — K651 Peritoneal abscess: Secondary | ICD-10-CM | POA: Diagnosis not present

## 2014-05-27 DIAGNOSIS — C259 Malignant neoplasm of pancreas, unspecified: Secondary | ICD-10-CM

## 2014-05-27 DIAGNOSIS — H409 Unspecified glaucoma: Secondary | ICD-10-CM | POA: Insufficient documentation

## 2014-05-27 DIAGNOSIS — Z90710 Acquired absence of both cervix and uterus: Secondary | ICD-10-CM | POA: Diagnosis not present

## 2014-05-27 DIAGNOSIS — G629 Polyneuropathy, unspecified: Secondary | ICD-10-CM | POA: Diagnosis not present

## 2014-05-27 DIAGNOSIS — Z9049 Acquired absence of other specified parts of digestive tract: Secondary | ICD-10-CM | POA: Insufficient documentation

## 2014-05-27 DIAGNOSIS — F17210 Nicotine dependence, cigarettes, uncomplicated: Secondary | ICD-10-CM | POA: Insufficient documentation

## 2014-05-27 DIAGNOSIS — C252 Malignant neoplasm of tail of pancreas: Secondary | ICD-10-CM

## 2014-05-27 DIAGNOSIS — E780 Pure hypercholesterolemia: Secondary | ICD-10-CM | POA: Diagnosis not present

## 2014-05-27 DIAGNOSIS — Z79899 Other long term (current) drug therapy: Secondary | ICD-10-CM | POA: Diagnosis not present

## 2014-05-27 LAB — COMPREHENSIVE METABOLIC PANEL (CC13)
ALK PHOS: 93 U/L (ref 40–150)
ALT: 14 U/L (ref 0–55)
ANION GAP: 13 meq/L — AB (ref 3–11)
AST: 47 U/L — ABNORMAL HIGH (ref 5–34)
Albumin: 2.9 g/dL — ABNORMAL LOW (ref 3.5–5.0)
BUN: 3.6 mg/dL — ABNORMAL LOW (ref 7.0–26.0)
CO2: 19 mEq/L — ABNORMAL LOW (ref 22–29)
Calcium: 9.3 mg/dL (ref 8.4–10.4)
Chloride: 106 mEq/L (ref 98–109)
Creatinine: 0.8 mg/dL (ref 0.6–1.1)
EGFR: 86 mL/min/{1.73_m2} — AB (ref >90)
Glucose: 110 mg/dl (ref 70–140)
Potassium: 3 mEq/L — CL (ref 3.5–5.1)
Sodium: 138 mEq/L (ref 136–145)
Total Protein: 7.5 g/dL (ref 6.4–8.3)

## 2014-05-27 LAB — CBC & DIFF AND RETIC
BASO%: 0.4 % (ref 0.0–2.0)
Basophils Absolute: 0 10*3/uL (ref 0.0–0.1)
EOS%: 1.6 % (ref 0.0–7.0)
Eosinophils Absolute: 0.1 10*3/uL (ref 0.0–0.5)
HCT: 31.3 % — ABNORMAL LOW (ref 34.8–46.6)
HGB: 10.2 g/dL — ABNORMAL LOW (ref 11.6–15.9)
IMMATURE RETIC FRACT: 5.6 % (ref 1.60–10.00)
LYMPH%: 26.4 % (ref 14.0–49.7)
MCH: 29.3 pg (ref 25.1–34.0)
MCHC: 32.6 g/dL (ref 31.5–36.0)
MCV: 89.9 fL (ref 79.5–101.0)
MONO#: 0.7 10*3/uL (ref 0.1–0.9)
MONO%: 8.7 % (ref 0.0–14.0)
NEUT#: 5 10*3/uL (ref 1.5–6.5)
NEUT%: 62.9 % (ref 38.4–76.8)
Platelets: 574 10*3/uL — ABNORMAL HIGH (ref 145–400)
RBC: 3.48 10*6/uL — AB (ref 3.70–5.45)
RDW: 16.7 % — ABNORMAL HIGH (ref 11.2–14.5)
RETIC %: 2.17 % — AB (ref 0.70–2.10)
RETIC CT ABS: 75.52 10*3/uL (ref 33.70–90.70)
WBC: 8 10*3/uL (ref 3.9–10.3)
lymph#: 2.1 10*3/uL (ref 0.9–3.3)

## 2014-05-27 MED ORDER — FENTANYL CITRATE (PF) 100 MCG/2ML IJ SOLN
50.0000 ug | Freq: Once | INTRAMUSCULAR | Status: AC
  Administered 2014-05-27: 50 ug via INTRAVENOUS

## 2014-05-27 MED ORDER — FENTANYL CITRATE (PF) 100 MCG/2ML IJ SOLN
INTRAMUSCULAR | Status: AC
  Filled 2014-05-27: qty 2

## 2014-05-27 MED ORDER — IOHEXOL 300 MG/ML  SOLN
50.0000 mL | Freq: Once | INTRAMUSCULAR | Status: AC | PRN
  Administered 2014-05-27: 20 mL via INTRAVENOUS

## 2014-05-27 MED ORDER — MIRTAZAPINE 15 MG PO TABS: 15.0000 mg | ORAL_TABLET | Freq: Every day | ORAL | Status: DC

## 2014-05-27 MED ORDER — LIDOCAINE HCL 1 % IJ SOLN
INTRAMUSCULAR | Status: AC
  Filled 2014-05-27: qty 20

## 2014-05-27 NOTE — Progress Notes (Signed)
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 CT chest was negative.    02/26/2014 Pathology Results FNA of pancreatic mass showed adenocarcinoma    03/22/2014 Initial Diagnosis Pancreatic adenocarcinoma   04/08/2014 Surgery robotic converted to hand-assisted laparoscopic distal pancreatectomy, splenectomy, partial colectomy, and partial removal of the left renal capsule for pancreatic adenocarcinoma. Surgical margins were negative. pT3N0   04/08/2014 - 05/04/2014 Hospital Admission admitted for pancreatic surgery, complicated with abdominal abcess, which was drained and treated with abx for pseudomonas infection, difficulty with eating and ambulatory, malnutrition, was discharged to rehab     04/08/2014 Pathology Results pT3N0, moderately differentiated invasive adenocarcinoma, spanning 4.5 cm, invading colonic submucosa. Lymphovascular invasion is identified, perineural invasion is identified. 10 lymph nodes were negative. Surgical margins were negative.    HISTORY OF PRESENTING ILLNESS:  [name] 69  y.o. 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 She had IVC filter placed back then and put on coumadin. She had second episode of right leg DVT when she was on coumadin in 2009, and continued coumadin until one year ago when she had trouble for monitoring her INR. She was then switched to Aspirin 81mg  dialy. 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 CT abdomen was ordered to further evaluate the DVT and abdominal pain on 12/22/2013, which showed thrombus in IVC and right common iliac vein below IVC. CT scan also showed a 2.8 x 2.5 cm mass within the tail of pancreas, this was confirmed by the MRI abdomen, with probably encasement of the splenic vessels. No significant abdominal adenopathy. CT of the chest was negative for distant metastasis. She was referred to see GI Dr.Hung and will have EGD/EUS for pancreatic mass biopsy in a week.   She has been having abdominal pain after eating in the pat few month, along with diarrhea and constipation, which has been chronic since her cholecystectomy in 199. Her appetite is fare, and weight is stable. She also has tremor since teenager, and got worse lately, along with gait instaedyness, was seen by neurologist Dr. Carles Collet. She was put on primidone which has help quite bit.   She had both knee surgery and was using a crane until recently when she was diagnosed with DVT and she  has been using a walker. She has been having some gait instability lately, she was referred by Dr. Carles Collet and MRI of brain showed mild chronic small vessel ischemia disease and cerebral atrophy.  INTERIM HISTORY [name] returns for follow-up with her daughter. She was  admitted to hospital on 4/9 for left pleural effusion, and had thoracesis twice. She was discharged back to SNF on 4/22. She is recovering slowly. She is still quite fatigued, able to do some PT/OT with assistance, and walk with a walker with short distance (in her room), but can not do much else yet. She also had left abdominal drainage tube exchanged by IR this morning. Pain is controlled. No fever or chills. She still has low appetite, eats little. BM is normal.    MEDICAL HISTORY:  Past Medical History  Diagnosis Date  . Glaucoma   . Clotting disorder   . DVT (deep venous thrombosis)     LEFT  . GERD (gastroesophageal reflux disease)   . Hypercholesterolemia   . Peripheral neuropathy   . Essential tremor   . Diverticulosis   . Arthritis     knees and back  . Cancer     Pancreatic  . Hypertension   . Colon polyps   . UTI (lower urinary tract infection)   . Complication of anesthesia     slow to wak up   . Pulmonary embolism     SURGICAL HISTORY: Past Surgical History  Procedure Laterality Date  . Cholecystectomy    . Abdominal hysterectomy    . Knee surgery Bilateral   . [place] filter    . Back surgery    . Eus N/A 02/26/2014    Procedure: UPPER ENDOSCOPIC ULTRASOUND (EUS) LINEAR;  Surgeon: Beryle Beams, MD;  Location: [facility];  Service: Endoscopy;  Laterality: N/A;  . Fine needle aspiration N/A 02/26/2014    Procedure: FINE NEEDLE ASPIRATION (FNA) LINEAR;  Surgeon: Beryle Beams, MD;  Location: [facility];  Service: Endoscopy;  Laterality: N/A;  . Breast lumpectomy Right   . Spine surgery    . Xi robotic assisted laparoscopic distal pancreatectomy N/A 04/08/2014    Procedure: XI ROBOTIC ASSISTED CONVERTED LAPAROSCOPIC HAND ASSITED DISTAL PANCREATECTOMY/SPLENECTOMY WITH PARTIAL COLECTOMY;  Surgeon: [name], MD;  Location: [facility];  Service: General;  Laterality: N/A;  . Colon surgery    . Diagnostic laparoscopy      SOCIAL HISTORY: History   Social  History  . Marital Status: Divorced    Spouse Name: N/A    Number of Children: 3  . Years of Education: N/A   Occupational History   She used to work for [employer], retired now    Social History Main Topics  . Smoking status: Current Every Day Smoker -- 0.50 packs/day, for 40 years     Types: Cigarettes  . Smokeless tobacco: Not on file  . Alcohol Use: 0.0 oz/week, social drinker     0 Not specified per week     Comment: occ- glass of wine a week   . Drug Use: No  . Sexual Activity: Not on file   Other Topics Concern  . Not on file   Social History Narrative   Divorced, 3 children   Right handed   Associates degree   3 per week    FAMILY HISTORY: Family History  Problem Relation Age of Onset  . Diabetes Father 25  . Diabetes Maternal Grandmother   . Hypertension Mother   . Glaucoma  Sister   . Cataracts Maternal Grandmother   . Cancer Sister 20    Breast    ALLERGIES:  is allergic to sulfa antibiotics.  MEDICATIONS:  Current Outpatient Prescriptions  Medication Sig Dispense Refill  . atorvastatin (LIPITOR) 40 MG tablet Take 1 tablet (40 mg total) by mouth daily. 90 tablet 0  . brimonidine-timolol (COMBIGAN) 0.2-0.5 % ophthalmic solution Place 1 drop into the left eye every 12 (twelve) hours.    . ciprofloxacin (CIPRO) 500 MG tablet Take 1 tablet (500 mg total) by mouth 2 (two) times daily.    Marland Kitchen diltiazem (CARTIA XT) 120 MG 24 hr capsule Take 1 capsule (120 mg total) by mouth daily. 90 capsule 0  . enoxaparin (LOVENOX) 120 MG/0.8ML injection Inject 0.86 mLs (130 mg total) into the skin daily. 30 Syringe 0  . HYDROcodone-acetaminophen (NORCO) 10-325 MG per tablet Take 1/2 tablet by mouth every 6 hours as needed for moderate pain. Do not exceed 4gm of Tylenol in 24h 60 tablet 0  . latanoprost (XALATAN) 0.005 % ophthalmic solution Place 1 drop into both eyes at bedtime.    . metoprolol succinate (TOPROL-XL) 25 MG 24 hr tablet Take 0.5 tablets (12.5 mg  total) by mouth daily.    . metroNIDAZOLE (FLAGYL) 500 MG tablet Take 1 tablet (500 mg total) by mouth every 8 (eight) hours.    . pantoprazole (PROTONIX) 40 MG tablet Take 1 tablet (40 mg total) by mouth 2 (two) times daily before a meal. 30 tablet 2  . primidone (MYSOLINE) 50 MG tablet Take 50 mg by mouth at bedtime. Patient takes at nite    . saccharomyces boulardii (FLORASTOR) 250 MG capsule Take 1 capsule (250 mg total) by mouth 2 (two) times daily.    . sucralfate (CARAFATE) 1 GM/10ML suspension Take 10 mLs (1 g total) by mouth 4 (four) times daily -  with meals and at bedtime. 420 mL 0  . tamsulosin (FLOMAX) 0.4 MG CAPS capsule Take 1 capsule (0.4 mg total) by mouth daily. 30 capsule 0   No current facility-administered medications for this visit.   Facility-Administered Medications Ordered in Other Visits  Medication Dose Route Frequency Provider Last Rate Last Dose  . fentaNYL (SUBLIMAZE) 100 MCG/2ML injection           . lidocaine (XYLOCAINE) 1 % (with pres) injection             REVIEW OF SYSTEMS:   Constitutional: Denies fevers, chills or abnormal night sweats, (+) fatigue  Eyes: Denies blurriness of vision, double vision or watery eyes Ears, nose, mouth, throat, and face: Denies mucositis or sore throat Respiratory: Denies cough, dyspnea or wheezes Cardiovascular: Denies palpitation, chest discomfort or lower extremity swelling Gastrointestinal:  Denies nausea, heartburn or change in bowel habits. (+) intermittent abdominal pain  Skin: Denies abnormal skin rashes Lymphatics: Denies new lymphadenopathy or easy bruising Neurological:Denies numbness, tingling or new weaknesses Behavioral/Psych: Mood is stable, no new changes  Extremities: Bilateral lower extremity edema and leg pain, left more than right. All other systems were reviewed with the patient and are negative.  PHYSICAL EXAMINATION: ECOG PERFORMANCE STATUS: 3 - Symptomatic, >50% confined to bed  Filed Vitals:    05/27/14 1418  BP: 120/57  Pulse: 100  Temp: 98.1 F (36.7 C)  Resp: 18   Filed Weights   05/27/14 1418  Weight: 185 lb (83.915 kg)    GENERAL:alert, no distress and comfortable, sitting in wheelchair.  SKIN: skin color, texture, turgor are normal, no  rashes. She has some bruises on her abdomen at the Lovenox injection site. She also has a 2 cm subcutaneous nodule on the right flank area, likely a hematoma. EYES: normal, conjunctiva are pink and non-injected, sclera clear OROPHARYNX:no exudate, no erythema and lips, buccal mucosa, and tongue normal  NECK: supple, thyroid normal size, non-tender, without nodularity LYMPH:  no palpable lymphadenopathy in the cervical, axillary or inguinal LUNGS: clear to auscultation and percussion with normal breathing effort HEART: regular rate & rhythm and no murmurs and no lower extremity edema ABDOMEN:abdomen soft, non-tender and normal bowel sounds Musculoskeletal:no cyanosis of digits and no clubbing  PSYCH: alert & oriented x 3 with fluent speech NEURO: no focal motor/sensory deficits Ext: (+) Bilateral Ankle swelling  LABORATORY DATA:  I have reviewed the data as listed Lab Results  Component Value Date   WBC 8.0 05/27/2014   HGB 10.2* 05/27/2014   HCT 31.3* 05/27/2014   MCV 89.9 05/27/2014   PLT 574* 05/27/2014   CMP Latest Ref Rng 05/27/2014 05/21/2014 05/19/2014  Glucose 70 - 140 mg/dl 110 92 91  BUN 7.0 - 26.0 mg/dL 3.6(L) 8 8  Creatinine 0.6 - 1.1 mg/dL 0.8 0.78 0.85  Sodium 136 - 145 mEq/L 138 137 138  Potassium 3.5 - 5.1 mEq/L 3.0(LL) 3.4(L) 3.7  Chloride 96 - 112 mmol/L - 106 107  CO2 22 - 29 mEq/L 19(L) 22 23  Calcium 8.4 - 10.4 mg/dL 9.3 8.6 8.4  Total Protein 6.4 - 8.3 g/dL 7.5 - -  Total Bilirubin 0.20 - 1.20 mg/dL <0.20 - -  Alkaline Phos 40 - 150 U/L 93 - -  AST 5 - 34 U/L 47(H) - -  ALT 0 - 55 U/L 14 - -     PATHOLOGY REPORT 04/08/2014 REASON FOR ADDENDUM, AMENDMENT OR CORRECTION: [id number]: AMENDMENT:  Amendment issued to report additional gross information in part #2. ds 04/14/14 04:55:45 PM FINAL DIAGNOSIS Diagnosis 04/08/2014 1. Colon, segmental resection, transverse - PANCREATIC ADENOCARCINOMA INVADING COLONIC SUBMUCOSA. - LYMPHOVASCULAR INVASION IS IDENTIFIED. - THE COLONIC RESECTION MARGINS ARE NEGATIVE FOR ADENOCARCINOMA. 2. Pancreas, resection - INVASIVE ADENOCARCINOMA, MODERATELY DIFFERENTIATED, SPANNING 4.5 CM. - LYMPHOVASCULAR INVASION IS IDENTIFIED. - PERINEURAL INVASION IS IDENTIFIED. - ADENOCARCINOMA IS BROADLY PRESENT AT T 10 mg 1 negative. Surgical margins were negative. HE CLINICAL COLONIC MARGIN OF SPECIMEN #2. - ADENOCARCINOMA IS ADHERENT TO THE RENAL CAPSULE, BUT DOES NOT INVOLVE RENAL PARENCHYMA. - ADENOCARCINOMA IS ADHERENT TO THE SPLENIC CAPSULE, BUT DOES NOT INVOLVE SPLENIC PARENCHYMA. - THE PANCREATIC PARENCHYMAL RESECTION MARGIN IS NEGATIVE FOR ADENOCARCINOMA. - THERE IS NO EVIDENCE OF CARCINOMA IN 10 OF 10 LYMPH NODES (0/10). - SEE ONCOLOGY TABLE BELOW. Microscopic Comment 2. PANCREAS (EXOCRINE): Procedure: Distal pancreatectomy with adherent renal capsule and portion of spleen and transverse colon resection. Tumor Site: Distal pancreatic tail. Tumor Size: 4.5 cm (gross measurement). Histologic Type: Adenocarcinoma. Histologic Grade (ductal carcinoma only): G2: Moderately differentiated Microscopic Tumor Extension: Adenocarcinoma involves colonic submucosa, renal capsule, and splenic capsule. Margins: The pancreatic parenchymal margin is negative for adenocarcinoma. Distance of invasive carcinoma from closest margin: 5.2 cm to the pancreatic parenchymal margin, see comment. Carcinoma in situ / high-grade dysplasia: Not identified. Treatment Effect: N/A. 1 of 3 Amended copy Amended FINAL for Antunes, Jaxsyn A ([id number]) Microscopic Comment(continued) Lymph-Vascular Invasion: Present, diffuse Perineural Invasion: Present, diffuse Lymph nodes: number  examined 10; number positive: 0. Pathologic Staging: pT3, pN0. Additional Pathologic Findings: No significant findings. Ancillary Studies: N/A. Comment(s): There is a primary 4.5 cm moderately differentiated pancreatic  adenocarcinoma present in the tail of the pancreas. There is direct extension of this tumor to the submucosa of the transverse colon (specimen #1). In addition, adenocarcinoma extends to and involves the renal capsule and splenic capsule. Despite four lymph nodes being negative for parenchymal metastases, one of the four lymph nodes does show perinodal deposits of adenocarcinoma. The adenocarcinoma present in the submucosa of the transverse colon, renal capsule, splenic capsule, and perinodal soft tissue is interpreted to represent direct extension of the primary tumor, rather than metastatic sites and thus, the tumor is staged as above.  Comment There are malignant cells with prominent nucleili and intracytoplasmic vacuoles, arranged in acinar pattern. The overall morphologic findings favor a diagnosis of well differentiated adenocarcinoma.  RADIOGRAPHIC STUDIES: I have personally reviewed the radiological images as listed and agreed with the findings in the report.  Ct Abdomen Pelvis Wo Contrast 04/29/2014    IMPRESSION: 1. Postoperative changes of distal pancreatectomy with stable pseudocyst or seroma adjacent to the surgical margin. 2. Resolved left upper quadrant abscess with 2 percutaneous drains in place. 3. Small pelvic free fluid. 4. Moderate left pleural effusion with likely compressive atelectasis in the left lower lobe. Difficult to definitively exclude pneumonia.   Electronically Signed   By: Lorin Picket M.D.   On: 04/29/2014 15:25    ASSESSMENT & PLAN:  69 year old Caucasian female, with past medical history of recurrent bilateral lower extremity DVTs, presented [facility] extensive left lower extremity DVT and thrombus in the IVC and the right, iliac  vein, and chronic DVT in the right popliteal vein. She was also found to have a pancreatic tail mass on the CT scan.  1. Pancreatic adenocarcinoma, pT3 N0 M0, stage IIA -I reviewed her surgical pathology result with her and her daughter.  her surgical margin was negative.  -I discussed the role of adjuvant chemotherapy after her surgery to reduce the risk of cancer recurrence after surgery. We usually recommend single agent gemcitabine as adjuvant therapy, however she had multiple complications after surgery, likely need long time to recover. -She is 7 weeks out of surgery now, I do not think she will recover quickly in the next few weeks, likely she won't be able to get adjuvant chemo.  -We discussed surveillance plan. I will get a repeated CT scan in 4-6 month after her surgery.  -she is going to see Dr. Barry Dienes next Friday  2. Recurrent DVT  -I reviewed her lab results of prothrombin gene mutation, factor V Leyden mutation, and antithrombin III, which were all negative. -Giving her recurrent unprovoked DVT, I recommend anticoagulation indefinitely. -continue lovenox 130mg  daily, her renal function recovered  3. She will continue to follow-up with her primary care physician and neurologist Dr. Carles Collet for her tremor, peripheral neuropathy and gait disturbance.   Plan: -Return to clinic in 4 weeks with lab    All questions were answered. The patient knows to call the clinic with any problems, questions or concerns. I spent 20 minutes counseling the patient face to face. The total time spent in the appointment was 25 minutes and more than 50% was on counseling.     Truitt Merle, MD 05/27/2014   2:46 PM

## 2014-05-27 NOTE — Procedures (Signed)
Drain injection demonstrated a residual collection caudal to the drain.  The drain was removed over wire and 5 French catheter was manipulated into collection.  12 ml of yellow purulent fluid was removed.  New 10.2 French drain placed into the collection.  Catheter attached to suction bulb. No immediate complication.

## 2014-05-27 NOTE — Consult Note (Addendum)
Chief Complaint: Follow up left abdominal drain  Referring Physician(s): Wagner,Jaime  History of Present Illness: [name] is a 69 y.o. female with history of distal pancreatectomy, splenectomy and partial colectomy.  Patient has a pigtail drain in the left upper abdomen.  Minimal output from drain but drainage is cloudy green fluid.  Drain injection today demonstrates a small residual cavity which is not completely draining.    Past Medical History  Diagnosis Date  . Glaucoma   . Clotting disorder   . DVT (deep venous thrombosis)     LEFT  . GERD (gastroesophageal reflux disease)   . Hypercholesterolemia   . Peripheral neuropathy   . Essential tremor   . Diverticulosis   . Arthritis     knees and back  . Cancer     Pancreatic  . Hypertension   . Colon polyps   . UTI (lower urinary tract infection)   . Complication of anesthesia     slow to wak up   . Pulmonary embolism     Past Surgical History  Procedure Laterality Date  . Cholecystectomy    . Abdominal hysterectomy    . Knee surgery Bilateral   . Greenfield filter    . Back surgery    . Eus N/A 02/26/2014    Procedure: UPPER ENDOSCOPIC ULTRASOUND (EUS) LINEAR;  Surgeon: Beryle Beams, MD;  Location: [facility];  Service: Endoscopy;  Laterality: N/A;  . Fine needle aspiration N/A 02/26/2014    Procedure: FINE NEEDLE ASPIRATION (FNA) LINEAR;  Surgeon: Beryle Beams, MD;  Location: [facility];  Service: Endoscopy;  Laterality: N/A;  . Breast lumpectomy Right   . Spine surgery    . Xi robotic assisted laparoscopic distal pancreatectomy N/A 04/08/2014    Procedure: XI ROBOTIC ASSISTED CONVERTED LAPAROSCOPIC HAND ASSITED DISTAL PANCREATECTOMY/SPLENECTOMY WITH PARTIAL COLECTOMY;  Surgeon: [name], MD;  Location: [facility];  Service: General;  Laterality: N/A;  . Colon surgery    . Diagnostic laparoscopy      Allergies: Sulfa antibiotics  Medications: Prior to Admission medications   Medication  Sig Start Date End Date Taking? Authorizing Provider  acetaminophen (TYLENOL) 325 MG tablet Take 2 tablets (650 mg total) by mouth every 6 (six) hours as needed for mild pain or fever. 05/21/14   Bonnielee Haff, MD  alum & mag hydroxide-simeth (MAALOX/MYLANTA) 200-200-20 MG/5ML suspension Take 30 mLs by mouth every 6 (six) hours as needed for indigestion or heartburn (or bloating). 05/21/14   Bonnielee Haff, MD  Alum & Mag Hydroxide-Simeth (MAGIC MOUTHWASH) SOLN Take 15 mLs by mouth 4 (four) times daily as needed for mouth pain (sore throat). 05/21/14   Bonnielee Haff, MD  atorvastatin (LIPITOR) 40 MG tablet Take 1 tablet (40 mg total) by mouth daily. 05/05/14   Gerlene Fee, NP  bismuth subsalicylate (PEPTO BISMOL) 262 MG/15ML suspension Take 30 mLs by mouth every 8 (eight) hours as needed for indigestion or diarrhea or loose stools. 05/21/14   Bonnielee Haff, MD  brimonidine-timolol (COMBIGAN) 0.2-0.5 % ophthalmic solution Place 1 drop into the left eye every 12 (twelve) hours.    Historical Provider, MD  ciprofloxacin (CIPRO) 500 MG tablet Take 1 tablet (500 mg total) by mouth 2 (two) times daily. 05/21/14   Bonnielee Haff, MD  diltiazem (CARTIA XT) 120 MG 24 hr capsule Take 1 capsule (120 mg total) by mouth daily. 05/05/14   Gerlene Fee, NP  enoxaparin (LOVENOX) 120 MG/0.8ML injection Inject 0.86 mLs (130  mg total) into the skin daily. 05/21/14   Bonnielee Haff, MD  HYDROcodone-acetaminophen (NORCO) 10-325 MG per tablet Take 1/2 tablet by mouth every 6 hours as needed for moderate pain. Do not exceed 4gm of Tylenol in 24h 05/24/14   Tiffany L Reed, DO  latanoprost (XALATAN) 0.005 % ophthalmic solution Place 1 drop into both eyes at bedtime.    Historical Provider, MD  loperamide (IMODIUM) 2 MG capsule Take 1 capsule (2 mg total) by mouth 3 (three) times daily as needed for diarrhea or loose stools. 05/21/14   Bonnielee Haff, MD  metoprolol succinate (TOPROL-XL) 25 MG 24 hr tablet Take 0.5 tablets (12.5  mg total) by mouth daily. 05/21/14   Bonnielee Haff, MD  metroNIDAZOLE (FLAGYL) 500 MG tablet Take 1 tablet (500 mg total) by mouth every 8 (eight) hours. 05/21/14   Bonnielee Haff, MD  pantoprazole (PROTONIX) 40 MG tablet Take 1 tablet (40 mg total) by mouth 2 (two) times daily before a meal. 05/21/14   Bonnielee Haff, MD  primidone (MYSOLINE) 50 MG tablet Take 50 mg by mouth at bedtime. Patient takes at [employer], MD  psyllium (HYDROCIL/METAMUCIL) 95 % PACK Take 1 packet by mouth daily. 05/21/14   Bonnielee Haff, MD  saccharomyces boulardii (FLORASTOR) 250 MG capsule Take 1 capsule (250 mg total) by mouth 2 (two) times daily. 05/21/14   Bonnielee Haff, MD  sucralfate (CARAFATE) 1 GM/10ML suspension Take 10 mLs (1 g total) by mouth 4 (four) times daily -  with meals and at bedtime. 05/04/14   [name], MD  tamsulosin (FLOMAX) 0.4 MG CAPS capsule Take 1 capsule (0.4 mg total) by mouth daily. 05/04/14   [name], MD     Family History  Problem Relation Age of Onset  . Diabetes Father   . Cancer Father 39    prostate  . Hyperlipidemia Father   . Cataracts Maternal Grandmother   . Hypertension Mother   . Arthritis Mother   . Heart disease Mother   . Glaucoma Sister   . Cancer Sister 82    Breast  . Cancer Paternal Uncle     throat +cig  . Diabetes Paternal Grandmother     History   Social History  . Marital Status: Divorced    Spouse Name: N/A  . Number of Children: 3  . Years of Education: 14   Occupational History  . Retired    Social History Main Topics  . Smoking status: Current Every Day Smoker -- 0.50 packs/day for 45 years    Types: Cigarettes  . Smokeless tobacco: Never Used  . Alcohol Use: 0.0 oz/week    0 Standard drinks or equivalent per week     Comment: occ- glass of brandy or wine socially   . Drug Use: No  . Sexual Activity: Not on file   Other Topics Concern  . Not on file   Social History Narrative   Divorced, 3 children   Right  handed   Associates degree   3 per week   Fun: Sleep, watch tv, reading   Denies any religious beliefs that effect health care       Review of Systems  Constitutional: Negative.   Respiratory: Negative.   Cardiovascular: Negative.   Gastrointestinal: Negative.  Negative for abdominal pain.    Vital Signs: There were no vitals taken for this visit.  Physical Exam  Cardiovascular: Normal rate, regular rhythm and normal heart sounds.   Pulmonary/Chest: Effort normal  and breath sounds normal.  Abdominal: Soft. Bowel sounds are normal. There is tenderness.  Tenderness with palpation in left upper quadrant. Drain intact.  Cloudy green fluid in drain bulb.    Mallampati Score:  MD Evaluation Airway: WNL Heart: WNL Abdomen: WNL Chest/ Lungs: WNL ASA  Classification: 2 Mallampati/Airway Score: Two  Imaging: Ct Abdomen Pelvis Wo Contrast  05/11/2014   CLINICAL DATA:  Evaluate for fluid collection. Pancreatic cancer. Previous distal pancreatectomy, splenectomy and partial colectomy.  EXAM: CT ABDOMEN AND PELVIS WITHOUT CONTRAST  TECHNIQUE: Multidetector CT imaging of the abdomen and pelvis was performed following the standard protocol without IV contrast.  COMPARISON:  04/29/2014  FINDINGS: Lung bases demonstrate a tiny amount of right pleural fluid and associated dependent atelectasis. There is a moderate size left pleural fluid collection with associated consolidation likely compressive atelectasis. Mild stable cardiomegaly.  Abdominal images demonstrate no change in positioning of the two left upper quadrant surgical drains. There is slightly less fluid adjacent the pigtail portion of the catheter. There is a slight decrease in size of a fluid collection over the surgical suture line overlying the distal pancreas measuring 2.1 x 2.7 cm (previously 2.7 x 3 cm) with no change in a small focus of air along the anterior aspect of the fluid collection.  Previous cholecystectomy. The liver,  adrenal glands and kidneys are otherwise unchanged. IVC filter unchanged. Postsurgical change over the anterior abdominal wall in the midline over the epigastric region.  Postsurgical change over the left colon as the anastomotic site is patent. There remains mild stranding of the adjacent mesenteric fat adjacent the anastomotic site. The appendix is normal. There is moderate diverticulosis throughout the colon.  Pelvic images demonstrate a Foley catheter within a decompressed bladder. Rectosigmoid colon is normal. There is a small amount of free fluid slightly improved. Degenerative changes of the spine and hips.  IMPRESSION: Two left upper quadrant surgical drains remain in place and unchanged in position. No significant fluid collection within the left upper quadrant.  Slight interval decrease in size of a fluid collection over the surgical bed adjacent the distal pancreatectomy measuring 2.1 x 2.7 cm (previously 2.7 x 3 cm).  Interval improvement small amount of free pelvic fluid.  Stable stranding of the mesenteric fat adjacent the colonic anastomosis in the left mid abdomen likely postsurgical change.  Other stable chronic changes within the abdomen.  Continued moderate left pleural effusion with associated compressive atelectasis. Tiny right pleural fluid collection with associated atelectasis.   Electronically Signed   By: Marin Olp M.D.   On: 05/11/2014 17:02   Ct Abdomen Pelvis Wo Contrast  04/29/2014   CLINICAL DATA:  Intermittent nausea and vomiting with poor appetite and left upper quadrant soreness. History of distal pancreatectomy/ splenectomy and partial colectomy. Subsequent encounter.  EXAM: CT ABDOMEN AND PELVIS WITHOUT CONTRAST  TECHNIQUE: Multidetector CT imaging of the abdomen and pelvis was performed following the standard protocol without IV contrast.  COMPARISON:  04/24/2014.  MR abdomen 01/05/2014.  FINDINGS: Lower chest: Lung bases show a moderate simple appearing left pleural  effusion with likely compressive atelectasis in the left lower lobe. Heart size within normal limits. Decreased attenuation of the intravascular compartment is indicative of anemia. No pericardial effusion.  Hepatobiliary: A lobulated low-attenuation lesion in the inferior right hepatic lobe measures 2.5 cm, stable, characterized as a cyst on 01/05/2014. Liver is otherwise unremarkable. Cholecystectomy. Extrahepatic bile duct measures 1.4 cm, stable.  Pancreas: Status post distal pancreatectomy with a focal low-attenuation  lesion at the surgical margin, measuring 2.7 x 3.0 cm, grossly stable in size. Pancreas is otherwise unremarkable.  Spleen: Surgically absent.  Adrenals/Urinary Tract: Adrenal glands and right kidney are unremarkable. Low-attenuation lesion in the interpolar left kidney measures 1.5 cm, stable and characterized as a cyst on 01/05/2014. Ureters are decompressed. Air in the bladder is presumably iatrogenic. Bladder wall may be slightly thickened.  Stomach/Bowel: Stomach, small bowel and proximal colon are unremarkable. An anastomosis is seen at the splenic flexure. Colon is otherwise unremarkable.  Vascular/Lymphatic: Atherosclerotic calcification of the arterial vasculature without abdominal aortic aneurysm. No pathologically enlarged lymph nodes. IVC filter is in place.  Reproductive: Hysterectomy.  Ovaries are visualized.  Other: Small pelvic free fluid. 2 percutaneous drains terminate in the left upper quadrant, along the posterior margin of the stomach and anterior margin of the left kidney. No definite associated fluid collection. Upper abdominal midline ventral hernia contains fat, small in size. Presumed subcutaneous injection densities and air in the ventral low abdomen and pelvis. Mesenteries and peritoneum are otherwise unremarkable.  Musculoskeletal: No worrisome lytic or sclerotic lesions.  IMPRESSION: 1. Postoperative changes of distal pancreatectomy with stable pseudocyst or seroma  adjacent to the surgical margin. 2. Resolved left upper quadrant abscess with 2 percutaneous drains in place. 3. Small pelvic free fluid. 4. Moderate left pleural effusion with likely compressive atelectasis in the left lower lobe. Difficult to definitively exclude pneumonia.   Electronically Signed   By: Lorin Picket M.D.   On: 04/29/2014 15:25   Dg Chest 1 View  05/14/2014   CLINICAL DATA:  Post attempted left-sided thoracentesis  EXAM: CHEST  1 VIEW  COMPARISON:  Earlier same day ; 05/13/2014; 05/10/2014; CT abdomen pelvis - 05/11/2014  FINDINGS: Grossly unchanged borderline enlarged cardiac silhouette and mediastinal contours with persistent obscuration of left heart border secondary to grossly unchanged small loculated left-sided effusion with associated left basilar consolidative opacities. No new focal airspace opacities. No pneumothorax. No evidence of edema. No acute osseus abnormalities. Percutaneous drainage catheter overlies the left upper abdominal quadrant.  IMPRESSION: 1. Unchanged small partially loculated left-sided effusion post attempted thoracentesis. No pneumothorax. 2. Grossly unchanged consolidative opacities within the left lower lung.   Electronically Signed   By: Sandi Mariscal M.D.   On: 05/14/2014 15:45   Dg Chest 1 View  05/10/2014   CLINICAL DATA:  Status post thoracentesis  EXAM: CHEST  1 VIEW  COMPARISON:  Chest radiograph and chest CT May 09, 2014  FINDINGS: No pneumothorax. Left effusion smaller following thoracentesis. There is some residual left effusion with left base atelectasis. There is a chest tube in the left subpleural region as well as a pigtail catheter in the left upper abdomen.  Right lung is clear. Heart is mildly enlarged with pulmonary vascularity, stable.  IMPRESSION: Left effusion smaller following thoracentesis. There is a small left effusion with left base atelectasis. Subpulmonic chest tube remains on the left as does a drain in the upper abdomen on the  left. Right lung is clear. No change in cardiac silhouette. No demonstrable pneumothorax.   Electronically Signed   By: Lowella Grip III M.D.   On: 05/10/2014 14:13   Dg Chest 2 View  05/14/2014   CLINICAL DATA:  Followup pleural effusion.  EXAM: CHEST  2 VIEW  COMPARISON:  Chest x-ray 05/13/2014 and CT abdomen 05/11/2014.  FINDINGS: Persistent small left pleural effusion with overlying atelectasis. The right lung remains clear. Stable left upper quadrant drainage catheter.  IMPRESSION: Persistent left  pleural effusion with overlying atelectasis.   Electronically Signed   By: Marijo Sanes M.D.   On: 05/14/2014 14:10   Dg Chest 2 View  05/13/2014   CLINICAL DATA:  Pancreatic cancer, shortness of breath, pleural effusions, weakness  EXAM: CHEST  2 VIEW  COMPARISON:  05/10/2014  FINDINGS: Pigtail drainage catheter LEFT upper quadrant.  Enlargement of cardiac silhouette.  Atherosclerotic calcification aorta.  Mediastinal contours and pulmonary vascularity otherwise normal.  Increased LEFT pleural effusion and basilar atelectasis since previous exam.  Remaining lungs clear.  No pneumothorax.  Bones demineralized.  IMPRESSION: Increased LEFT pleural effusion and basilar atelectasis.  Enlargement of cardiac silhouette.   Electronically Signed   By: Lavonia Dana M.D.   On: 05/13/2014 14:11   Dg Chest 2 View  05/09/2014   CLINICAL DATA:  69 year old female with shortness of breath and chest pain. Initial encounter.  Personal history of distal pancreatectomy and splenectomy.  EXAM: CHEST  2 VIEW  COMPARISON:  Chest and abdominal series 4 /09/2014. CT Abdomen and Pelvis 04/29/2014, portable chest 04/17/2014.  FINDINGS: Semi upright AP and lateral views of the chest. Left pleural effusion has progressed and is moderate to large. Increased opacification at the left lung base. Lower lung volumes.  Stable cardiac size and mediastinal contours. No pneumothorax or pulmonary edema. Grossly clear right lung. Visualized  tracheal air column is within normal limits.  Left abdomen percutaneous drains remain in place. Partially visible IVC filter.  IMPRESSION: 1. Progressed left pleural effusion, now moderate to large. Associated decreased ventilation in the left lung. 2. Lower lung volumes.   Electronically Signed   By: Genevie Ann M.D.   On: 05/09/2014 08:49   Ct Chest W Contrast  05/18/2014   CLINICAL DATA:  Loculated left pleural effusion, chest pain.  EXAM: CT CHEST WITH CONTRAST  TECHNIQUE: Multidetector CT imaging of the chest was performed during intravenous contrast administration.  CONTRAST:  46mL OMNIPAQUE IOHEXOL 300 MG/ML  SOLN  COMPARISON:  CT scan of May 09, 2014.  FINDINGS: No pneumothorax is noted. Mild to moderate left pleural effusion is noted with associated atelectasis of the left lower lobe. The superior portion of this effusion appears to be loculated. There is no evidence of thoracic aortic dissection or aneurysm. Visualized portions of pulmonary arteries appear normal. No mediastinal mass or adenopathy is noted no significant osseous abnormality is noted. Percutaneous drain is again noted between the stomach and left kidney.  IMPRESSION: Mild to moderate left pleural effusion is noted with adjacent atelectasis of the left lower lobe. The smaller superior portion of this effusion appears to be loculated. This appears to be smaller compared to prior exam.   Electronically Signed   By: Marijo Conception, M.D.   On: 05/18/2014 12:13   Ct Angio Chest Pe W/cm &/or Wo Cm  05/09/2014   CLINICAL DATA:  Shortness of breath, chest pain.  EXAM: CT ANGIOGRAPHY CHEST WITH CONTRAST  TECHNIQUE: Multidetector CT imaging of the chest was performed using the standard protocol during bolus administration of intravenous contrast. Multiplanar CT image reconstructions and MIPs were obtained to evaluate the vascular anatomy.  CONTRAST:  15mL OMNIPAQUE IOHEXOL 350 MG/ML SOLN  COMPARISON:  CT scan of April 29, 2014.  FINDINGS: No  pneumothorax is noted. Moderate left pleural effusion is noted with associated atelectasis of the left lower lobe. Right lung is clear. There is no evidence of thoracic aortic dissection or aneurysm. There is no definite evidence of pulmonary embolus. No mediastinal  mass or adenopathy is noted. No significant osseous abnormality is noted. Continued presence of drainage catheter seen between the stomach and left kidney. Continued presence of fluid collection seen medial to proximal stomach.  Review of the MIP images confirms the above findings.  IMPRESSION: No evidence of pulmonary embolus.  Moderate left pleural effusion is noted with associated atelectasis of the left lower lobe.  Continued presence of drainage catheter seen between the stomach and left kidney, as well as continued and stable appearance of fluid collection medial to proximal stomach. Please refer to report of CT scan of April 29, 2014.   Electronically Signed   By: Marijo Conception, M.D.   On: 05/09/2014 16:56   Ir Sinus/fist Tube Chk-non Gi  05/17/2014   CLINICAL DATA:  Pancreatic carcinoma, post distal pancreatectomy and splenectomy with persistent leak. Surgical drain remains in place into pigtail catheter had been placed into a retroperitoneal collection ; output has tapered off.  EXAM: RETROPERITONEAL DRAIN CATHETER INJECTION UNDER FLUOROSCOPY  TECHNIQUE: The procedure, risks (including but not limited to bleeding, infection, organ damage ), benefits, and alternatives were explained to the patient. Questions regarding the procedure were encouraged and answered. The patient understands and consents to the procedure.  Survey fluoroscopic inspection reveals stable position of pigtail catheter in the left upper abdomen and surgical drain to the subphrenic space. A small left pleural effusion is noted.  Injection of the left upper quadrant percutaneous pigtail drain catheter demonstrates filling of a small irregular cavity around the drain  catheter. With continued injection, contrast tracks back along the catheter and exits the skin entry site. There is no opacification of the pancreatic duct or biliary tree. With aspiration, most of the injected contrast can be removed.  After telephone consultation with Dr. Barry Dienes, the decision was made to proceed with injection of the left lateral surgical drain catheter. However, the catheter is occluded such that injection using a moderately vigorous pressure did not result in any contrast entering the drain catheter. Inspection demonstrates that the retention sutures is not occlusive. High pressure injection was deferred. Both catheters were returned to bulb drainage.  The patient tolerated the procedure well. No immediate complication.  IMPRESSION: 1. Small residual cavity around the pigtail drain catheter, with NO communication to the pancreatic duct or biliary tree opacified. 2. Occlusion of surgical drain catheter.   Electronically Signed   By: Lucrezia Europe M.D.   On: 05/17/2014 15:03   Dg Abd Acute W/chest  05/08/2014   CLINICAL DATA:  Left upper quadrant and left lower quadrant abdominal pain.  EXAM: DG ABDOMEN ACUTE W/ 1V CHEST  COMPARISON:  04/29/2014  FINDINGS: Two surgical drains are identified within the left upper quadrant of the abdomen. These are similar in position to 3/30 1/16. There is an IVC filter in place and there are surgical clips from prior cholecystectomy. There is no evidence of dilated bowel loops or free intraperitoneal air. No radiopaque calculi or other significant radiographic abnormality is seen. Heart size and mediastinal contours are within normal limits. Both lungs are clear. Moderate left pleural effusion is again noted.  IMPRESSION: 1. Nonobstructive bowel gas pattern. 2. Left pleural effusion 3. Similar position of left upper quadrant surgical drains.   Electronically Signed   By: Kerby Moors M.D.   On: 05/08/2014 18:27   US Thoracentesis Asp Pleural Space W/img  Guide  05/14/2014   INDICATION: Symptomatic left sided pleural effusion  EXAM: US THORACENTESIS ASP PLEURAL SPACE W/IMG GUIDE  COMPARISON:  Chest radiograph - earlier same day; 05/13/2014; 05/10/2014; CT abdomen pelvis - 05/11/2014  MEDICATIONS: None  COMPLICATIONS: None immediate  TECHNIQUE: Informed written consent was obtained from the patient after a discussion of the risks, benefits and alternatives to treatment. A timeout was performed prior to the initiation of the procedure.  Initial ultrasound scanning demonstrates a small pleural effusion with multiple internal echogenic septations and apparent tethering of the left lower lobe (representative images 1 through 4). The lower chest was prepped and draped in the usual sterile fashion. 1% lidocaine was used for local anesthesia.  A 5 [place] Yueh sheath needle was advanced into an anechoic component left-sided pleural effusion however only yielded approximately 2 cc of fluid. Yueh sheath needle was then advanced into a separate anechoic component of the left-sided pleural effusion however only approximately 8 cc of fluid was able to be aspirated. Multiple ultrasound images were saved for documentation purposes.  The catheter was removed and a dressing was applied. The patient tolerated the procedure well without immediate post procedural complication. The patient was escorted to have an upright chest radiograph.  FINDINGS: A total of approximately 10 cc of blood tinged serous fluid was removed.  IMPRESSION: Successful ultrasound-guided left sided thoracentesis yielding only 10 cc liters of pleural fluid secondary to marked loculation of the effusion.   Electronically Signed   By: Sandi Mariscal M.D.   On: 05/14/2014 16:06   US Thoracentesis Asp Pleural Space W/img Guide  05/10/2014   INDICATION: Symptomatic left sided pleural effusion  EXAM: US THORACENTESIS ASP PLEURAL SPACE W/IMG GUIDE  COMPARISON:  CXR 05/09/14.  MEDICATIONS: None  COMPLICATIONS: None  immediate  TECHNIQUE: Informed written consent was obtained from the patient after a discussion of the risks, benefits and alternatives to treatment. A timeout was performed prior to the initiation of the procedure.  Initial ultrasound scanning demonstrates a left pleural effusion. The lower chest was prepped and draped in the usual sterile fashion. 1% lidocaine was used for local anesthesia.  Under direct ultrasound guidance, a 19 gauge, 10-cm, Yueh catheter was introduced. An ultrasound image was saved for documentation purposes. The thoracentesis was performed. The catheter was removed and a dressing was applied. The patient tolerated the procedure well without immediate post procedural complication. The patient was escorted to have an upright chest radiograph.  FINDINGS: A total of approximately 500 ml of blood tinged fluid was removed. Requested samples were sent to the laboratory.  IMPRESSION: Successful ultrasound-guided left sided thoracentesis yielding 500 ml of pleural fluid.  Read By:  Tsosie Billing PA-C   Electronically Signed   By: Sandi Mariscal M.D.   On: 05/10/2014 14:14    Labs:  CBC:  Recent Labs  05/17/14 0417 05/18/14 0532 05/19/14 0434 05/21/14 0518  WBC 7.9 11.2* 9.6 7.1  HGB 8.7* 9.4* 8.7* 8.7*  HCT 27.5* 30.6* 28.3* 27.8*  PLT 420* 536* 528* 527*    COAGS:  Recent Labs  04/01/14 1400 04/08/14 0630 04/09/14 0404 05/09/14 0655  INR 1.11 1.06 1.12 1.08  APTT  --  40* 33  --     BMP:  Recent Labs  05/17/14 0417 05/18/14 0532 05/19/14 0434 05/21/14 0518  NA 133* 135 138 137  K 3.0* 4.3 3.7 3.4*  CL 99 104 107 106  CO2 24 23 23 22   GLUCOSE 94 113* 91 92  BUN 6 8 8 8   CALCIUM 8.3* 8.7 8.4 8.6  CREATININE 0.79 0.89 0.85 0.78  GFRNONAA 84* 66* 69* 85*  GFRAA >90 76* 80* >90    LIVER FUNCTION TESTS:  Recent Labs  04/24/14 2039  04/30/14 0450  05/03/14 0440 05/04/14 0415 05/09/14 0655 05/10/14 0350 05/16/14 0422  BILITOT 0.5  --  0.3  --   --    --  0.5  --  0.3  AST 30  --  29  --   --   --  38*  --  36  ALT 13  --  10  --   --   --  17  --  16  ALKPHOS 83  --  65  --   --   --  78  --  88  PROT 6.1  --  6.0  --   --   --  7.6 6.7 6.8  ALBUMIN 2.4*  < > 3.0*  < > 3.1* 3.2* 3.8  --  2.8*  < > = values in this interval not displayed.  TUMOR MARKERS:  Recent Labs  05/06/14 1317  CA199 1.2    Assessment and Plan:  Post operative drain in left upper quadrant.  Small residual collection and needs drain repositioning.  Discussed with patient and daughter.  Patient wants moderate sedation for drain manipulation.  Plan for drain exchange and reposition with fluoroscopic guidance with moderate sedation.  Informed consent obtained.      SignedCarylon Perches 05/27/2014, 9:40 AM

## 2014-05-27 NOTE — Telephone Encounter (Signed)
Pt confirmed labs/ov per 04/28 POF, gave pt AVS and Calendar.... KJ °

## 2014-05-28 LAB — CANCER ANTIGEN 19-9: CA 19 9: 6.1 U/mL (ref <35.0)

## 2014-05-28 NOTE — Progress Notes (Addendum)
Patient ID: [name], female   DOB: [birth year], 69 y.o.   MRN: [id number]                 HISTORY & PHYSICAL  DATE:  05/25/2014           FACILITY: [facility]                LEVEL OF CARE:   SNF   CHIEF COMPLAINT:  Admission to SNF, post stay at [facility], 05/09/2014 through 05/21/2014.      HISTORY OF PRESENT ILLNESS:  This is a patient who was previously in [facility] from 04/08/2014 through 05/04/2014.   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  She was ultimately found to have a left upper quadrant abscess and a drain was placed.  This did grow Pseudomonas.     She was readmitted on this occasion to [facility] when she was found to have left upper chest pain and upper abdominal pain.  She was found to have a moderate size pleural effusion requiring IR-guided left thoracentesis on at least two occasions.  Cultures of this were negative.  I do not exactly at this moment see the analysis of the fluid.  She had a 2D-echocardiogram that was not remarkable.  After thoracentesis x2, she had a CT scan of the chest that showed persistence of the left effusion.  However, the loculated area seemed to be decreasing in size.  She was seen by Thoracic Surgery.  She was not felt to be a candidate for any surgical intervention.    With regards to the underlying left upper quadrant abscess, she was initially treated with Zosyn and discharged on Cipro and Flagyl.  These were to continue for 3-6 weeks total with the duration determined by Surgery.  A repeat CT scan on April 12th showed decrease in size of the abscess.    The patient also has other medical issues including a history of a DVT, status post IVC filter.  She is on longstanding Lovenox, which she has actually done herself.    She also had persistent tachycardia and was evaluated by Cardiology,  treated with Cardizem and beta blockers.    Finally, she had urinary retention.  She comes to [place] with a Foley catheter.  She was placed on Flomax.  She actually has an appointment with Urology this afternoon, per the patient, and with Interventional Radiology at noon ?to take out her left upper quadrant abscess drain.     PAST MEDICAL HISTORY/PROBLEM LIST:              Pancreatic cancer.  Status post distal pancreatectomy, splenectomy, and partial colectomy on 04/08/2014.    Remote history of pulmonary embolism with an IVC filter.  Maintained on longstanding Lovenox.    Hypertension.     Idiopathic peripheral neuropathy.    Intra-abdominal abscess.    Left pleural effusion.  Felt to be connected with the abscess, although I do not see the actual fluid analysis.    Anemia of chronic disease.    Urinary retention.        CURRENT MEDICATIONS:  Medication list is reviewed.                 Cipro 500 b.i.d.      Toprol XL  tablet/12.5 daily.     Flagyl 500 q.8.     Metamucil  1 pack daily.    Florastor 250 b.i.d.      Lovenox 130 mg subcu daily.    Protonix 40 b.i.d.       Lipitor 40 q.d.         Xalatan ophthalmic.    Maalox p.r.n.      Magic Mouthwash p.r.n.     Pepto-Bismol p.r.n.        Imodium p.r.n.      SOCIAL HISTORY:                   HOUSING:  The patient tells me she lives at home with her daughter and two grandchildren.   FUNCTIONAL STATUS:  She has a walker, only used it for outside activities.     TOBACCO USE:  She was a smoker up until the time of this most recent illness.   ADVANCED DIRECTIVES:    FAMILY HISTORY:        FATHER:  Diabetes and cancer in her father.  Deceased.   MOTHER:  Heart disease in her mother.  Deceased.     REVIEW OF SYSTEMS:            CHEST/RESPIRATORY:  The patient is still having some pleuritic chest pain, mostly bothering her at night.  Some exertional shortness of breath.  No cough.      CARDIAC:  No clear anterior  exertional chest pain.    GI:  She is complaining about anorexia.  States the Metamucil makes her sick and wants this stopped.  She is not complaining of abdominal pain. States her bowel movements are soft.    GU:  Has a Foley catheter in place and is seeing Urology this afternoon.   NEUROLOGIC:  Neuropathic issues in her lower extremities.      MUSCULOSKELETAL:  Osteoarthritis of both knees.    PHYSICAL EXAMINATION:   VITAL SIGNS:     PULSE:  111 and irregular.   RESPIRATIONS:  18 and unlabored.   02 SATURATIONS:  95%.     GENERAL APPEARANCE:  The patient looks remarkably well, considering all she has been through.   LYMPHATICS:  None palpable in the cervical, clavicular areas.  She has a soft, immobile node in the left axilla.   CHEST/RESPIRATORY:  Clear air entry bilaterally.     CARDIOVASCULAR:   CARDIAC:  Other than the tachycardia, there are no gallops.  She appears to be euvolemic.   GASTROINTESTINAL:   ABDOMEN:  The surgical scar has healed.  She has a drain in the left upper quadrant.   LIVER/SPLEEN/KIDNEY:   No liver, no spleen palpable.   MUSCULOSKELETAL:    EXTREMITIES:   BILATERAL LOWER EXTREMITIES:  She has osteoarthritis of both knees.   CIRCULATION:   EDEMA/VARICOSITIES:  No signs of a DVT.   NEUROLOGICAL:   BALANCE/GAIT:  She is able to bring herself easily to a standing position.     ASSESSMENT/PLAN:                      Left pleural effusion, I think ultimately felt to be an extension of her underlying left upper quadrant abscess which, in turn, was due to Pseudomonas.  She was discharged on Cipro and Flagyl, which I will continue pending further instructions from Surgery or Infectious Disease.    Left upper quadrant abscess.  Still with drain in place.  She is going to Interventional Radiology today.    Pancreatic cancer at the tail of her  pancreas.  Status post a significant surgical procedure in March.  This was complicated by the left upper quadrant abscess.       Anorexia.  Exact cause unclear.    Hypokalemia.  She left the hospital with a potassium of 3.4.  This has already been rechecked on 05/22/2014.  Her potassium was 4.1.  BUN and creatinine were normal.  Her sodium was 132, however.    Normochromic, normocytic anemia.  Probably anemia of chronic disease.  Left the hospital with a hemoglobin of 8.7.  This is up to 9.  Her white count is normal at 7.3.    Urinary retention: apparently has urology follow-up.   Apparent idiopathic peripheral neuropathy; In spite of this and per prolonged recumbancy she is already up with her walker. I found this to be quite remarkable   The patient appears to be doing remarkably well.  She is already up and able to walk with a walker in spite of longstanding osteoarthritis of her knees and her idiopathic peripheral neuropathy.    I have written orders to continue her antibiotics.    I will recheck her sodium and potassium next week.     Her Foley catheter is apparently going to be reviewed by Urology.       CPT CODE: 10175

## 2014-05-29 ENCOUNTER — Encounter (HOSPITAL_COMMUNITY): Payer: Self-pay | Admitting: Emergency Medicine

## 2014-05-29 ENCOUNTER — Emergency Department (HOSPITAL_COMMUNITY)
Admission: EM | Admit: 2014-05-29 | Discharge: 2014-05-29 | Disposition: A | Discharge status: Home / Self Care | Payer: Medicare Other | Attending: Emergency Medicine | Admitting: Emergency Medicine

## 2014-05-29 ENCOUNTER — Emergency Department (HOSPITAL_COMMUNITY): Payer: Medicare Other

## 2014-05-29 DIAGNOSIS — Z86718 Personal history of other venous thrombosis and embolism: Secondary | ICD-10-CM | POA: Diagnosis not present

## 2014-05-29 DIAGNOSIS — Z86010 Personal history of colonic polyps: Secondary | ICD-10-CM | POA: Diagnosis not present

## 2014-05-29 DIAGNOSIS — Z87440 Personal history of urinary (tract) infections: Secondary | ICD-10-CM | POA: Insufficient documentation

## 2014-05-29 DIAGNOSIS — H409 Unspecified glaucoma: Secondary | ICD-10-CM | POA: Diagnosis not present

## 2014-05-29 DIAGNOSIS — Z862 Personal history of diseases of the blood and blood-forming organs and certain disorders involving the immune mechanism: Secondary | ICD-10-CM | POA: Diagnosis not present

## 2014-05-29 DIAGNOSIS — J90 Pleural effusion, not elsewhere classified: Secondary | ICD-10-CM

## 2014-05-29 DIAGNOSIS — I10 Essential (primary) hypertension: Secondary | ICD-10-CM | POA: Insufficient documentation

## 2014-05-29 DIAGNOSIS — Z9089 Acquired absence of other organs: Secondary | ICD-10-CM | POA: Diagnosis not present

## 2014-05-29 DIAGNOSIS — R109 Unspecified abdominal pain: Secondary | ICD-10-CM

## 2014-05-29 DIAGNOSIS — Z8507 Personal history of malignant neoplasm of pancreas: Secondary | ICD-10-CM | POA: Diagnosis not present

## 2014-05-29 DIAGNOSIS — R1012 Left upper quadrant pain: Secondary | ICD-10-CM | POA: Diagnosis not present

## 2014-05-29 DIAGNOSIS — Z90710 Acquired absence of both cervix and uterus: Secondary | ICD-10-CM | POA: Diagnosis not present

## 2014-05-29 DIAGNOSIS — Z720 Tobacco use: Secondary | ICD-10-CM | POA: Diagnosis not present

## 2014-05-29 DIAGNOSIS — Z86711 Personal history of pulmonary embolism: Secondary | ICD-10-CM | POA: Insufficient documentation

## 2014-05-29 DIAGNOSIS — Z9889 Other specified postprocedural states: Secondary | ICD-10-CM | POA: Insufficient documentation

## 2014-05-29 DIAGNOSIS — Z792 Long term (current) use of antibiotics: Secondary | ICD-10-CM | POA: Diagnosis not present

## 2014-05-29 DIAGNOSIS — E780 Pure hypercholesterolemia: Secondary | ICD-10-CM | POA: Diagnosis not present

## 2014-05-29 DIAGNOSIS — Z79899 Other long term (current) drug therapy: Secondary | ICD-10-CM | POA: Insufficient documentation

## 2014-05-29 DIAGNOSIS — Z7901 Long term (current) use of anticoagulants: Secondary | ICD-10-CM | POA: Insufficient documentation

## 2014-05-29 DIAGNOSIS — M159 Polyosteoarthritis, unspecified: Secondary | ICD-10-CM | POA: Diagnosis not present

## 2014-05-29 DIAGNOSIS — K219 Gastro-esophageal reflux disease without esophagitis: Secondary | ICD-10-CM | POA: Diagnosis not present

## 2014-05-29 LAB — COMPREHENSIVE METABOLIC PANEL
ALK PHOS: 77 U/L (ref 39–117)
ALT: 13 U/L (ref 0–35)
AST: 38 U/L — ABNORMAL HIGH (ref 0–37)
Albumin: 3.1 g/dL — ABNORMAL LOW (ref 3.5–5.2)
Anion gap: 8 (ref 5–15)
BILIRUBIN TOTAL: 0.7 mg/dL (ref 0.3–1.2)
BUN: 5 mg/dL — AB (ref 6–23)
CO2: 21 mmol/L (ref 19–32)
Calcium: 8.6 mg/dL (ref 8.4–10.5)
Chloride: 106 mmol/L (ref 96–112)
Creatinine, Ser: 0.75 mg/dL (ref 0.50–1.10)
GFR calc non Af Amer: 86 mL/min — ABNORMAL LOW (ref >90)
GLUCOSE: 101 mg/dL — AB (ref 70–99)
Potassium: 2.7 mmol/L — CL (ref 3.5–5.1)
Sodium: 135 mmol/L (ref 135–145)
TOTAL PROTEIN: 7.1 g/dL (ref 6.0–8.3)

## 2014-05-29 LAB — URINALYSIS, ROUTINE W REFLEX MICROSCOPIC
Bilirubin Urine: NEGATIVE
Glucose, UA: NEGATIVE mg/dL
Hgb urine dipstick: NEGATIVE
Ketones, ur: NEGATIVE mg/dL
Leukocytes, UA: NEGATIVE
NITRITE: NEGATIVE
PH: 6.5 (ref 5.0–8.0)
Protein, ur: NEGATIVE mg/dL
SPECIFIC GRAVITY, URINE: 1.038 — AB (ref 1.005–1.030)
UROBILINOGEN UA: 0.2 mg/dL (ref 0.0–1.0)

## 2014-05-29 LAB — CBC WITH DIFFERENTIAL/PLATELET
BASOS ABS: 0 10*3/uL (ref 0.0–0.1)
Basophils Relative: 0 % (ref 0–1)
Eosinophils Absolute: 0.2 10*3/uL (ref 0.0–0.7)
Eosinophils Relative: 4 % (ref 0–5)
HCT: 29.8 % — ABNORMAL LOW (ref 36.0–46.0)
HEMOGLOBIN: 9.2 g/dL — AB (ref 12.0–15.0)
LYMPHS PCT: 30 % (ref 12–46)
Lymphs Abs: 1.9 10*3/uL (ref 0.7–4.0)
MCH: 28 pg (ref 26.0–34.0)
MCHC: 30.9 g/dL (ref 30.0–36.0)
MCV: 90.9 fL (ref 78.0–100.0)
Monocytes Absolute: 0.7 10*3/uL (ref 0.1–1.0)
Monocytes Relative: 11 % (ref 3–12)
NEUTROS ABS: 3.5 10*3/uL (ref 1.7–7.7)
NEUTROS PCT: 55 % (ref 43–77)
Platelets: 526 10*3/uL — ABNORMAL HIGH (ref 150–400)
RBC: 3.28 MIL/uL — AB (ref 3.87–5.11)
RDW: 16.7 % — ABNORMAL HIGH (ref 11.5–15.5)
WBC: 6.4 10*3/uL (ref 4.0–10.5)

## 2014-05-29 LAB — LIPASE, BLOOD: LIPASE: 49 U/L (ref 11–59)

## 2014-05-29 LAB — TROPONIN I: Troponin I: <0.03 ng/mL (ref <0.031)

## 2014-05-29 MED ORDER — METRONIDAZOLE 500 MG PO TABS
500.0000 mg | ORAL_TABLET | Freq: Three times a day (TID) | ORAL | Status: DC
Start: 2014-05-29 — End: 2014-06-30

## 2014-05-29 MED ORDER — MORPHINE SULFATE 4 MG/ML IJ SOLN
4.0000 mg | Freq: Once | INTRAMUSCULAR | Status: AC
  Administered 2014-05-29: 4 mg via INTRAVENOUS
  Filled 2014-05-29: qty 1

## 2014-05-29 MED ORDER — HYDROCODONE-ACETAMINOPHEN 10-325 MG PO TABS: 0.5000 | ORAL_TABLET | Freq: Two times a day (BID) | ORAL | Status: DC | PRN

## 2014-05-29 MED ORDER — POTASSIUM CHLORIDE 10 MEQ/100ML IV SOLN
10.0000 meq | INTRAVENOUS | Status: AC
  Administered 2014-05-29: 10 meq via INTRAVENOUS
  Filled 2014-05-29: qty 100

## 2014-05-29 MED ORDER — IOHEXOL 300 MG/ML  SOLN
100.0000 mL | Freq: Once | INTRAMUSCULAR | Status: AC | PRN
  Administered 2014-05-29: 100 mL via INTRAVENOUS

## 2014-05-29 MED ORDER — IOHEXOL 300 MG/ML  SOLN
50.0000 mL | Freq: Once | INTRAMUSCULAR | Status: AC | PRN
  Administered 2014-05-29: 50 mL via ORAL

## 2014-05-29 MED ORDER — POTASSIUM CHLORIDE CRYS ER 20 MEQ PO TBCR
60.0000 meq | EXTENDED_RELEASE_TABLET | Freq: Once | ORAL | Status: AC
  Administered 2014-05-29: 60 meq via ORAL
  Filled 2014-05-29: qty 3

## 2014-05-29 MED ORDER — ONDANSETRON 4 MG PO TBDP: 4.0000 mg | ORAL_TABLET | Freq: Three times a day (TID) | ORAL | Status: DC | PRN

## 2014-05-29 MED ORDER — ONDANSETRON HCL 4 MG/2ML IJ SOLN
4.0000 mg | Freq: Once | INTRAMUSCULAR | Status: AC
  Administered 2014-05-29: 4 mg via INTRAVENOUS
  Filled 2014-05-29: qty 2

## 2014-05-29 MED ORDER — CIPROFLOXACIN 500 MG/5ML (10%) PO SUSR: 500.0000 mg | Freq: Two times a day (BID) | ORAL | Status: DC

## 2014-05-29 MED ORDER — MAGNESIUM SULFATE 2 GM/50ML IV SOLN
2.0000 g | Freq: Once | INTRAVENOUS | Status: AC
  Administered 2014-05-29: 2 g via INTRAVENOUS
  Filled 2014-05-29: qty 50

## 2014-05-29 NOTE — ED Notes (Signed)
Patient c/o nausea. Will hold PO potassium until nausea medication is administered.

## 2014-05-29 NOTE — ED Provider Notes (Signed)
CSN: [id number]     Arrival date & time 05/29/14  0005 History   First MD Initiated Contact with Patient 05/29/14 0105     Chief Complaint  Patient presents with  . Abdominal Pain     (Consider location/radiation/quality/duration/timing/severity/associated sxs/prior Treatment) HPI LACHRISHA ZIEBARTH is a 69 y.o. female with past medical history of pancreatic cancer status post resection presenting today with worsening abdominal pain. Patient had drains in her left pleural space and the left upper quadrant. This drain was just adjusted 2 days ago by interventional radiology. Since then she's had worsening pain, nausea, vomiting. Patient denies any fevers. There's been no changes in her bowel movements or urine. Daughter is concerned that there is a problem as the output is changes as well. A used to be serosanguineous however now it is a dark brown output. Her home pain medication has not relieved her symptoms. She has no further complaints.  10 Systems reviewed and are negative for acute change except as noted in the HPI.    Past Medical History  Diagnosis Date  . Glaucoma   . Clotting disorder   . DVT (deep venous thrombosis)     LEFT  . GERD (gastroesophageal reflux disease)   . Hypercholesterolemia   . Peripheral neuropathy   . Essential tremor   . Diverticulosis   . Arthritis     knees and back  . Cancer     Pancreatic  . Hypertension   . Colon polyps   . UTI (lower urinary tract infection)   . Complication of anesthesia     slow to wak up   . Pulmonary embolism    Past Surgical History  Procedure Laterality Date  . Cholecystectomy    . Abdominal hysterectomy    . Knee surgery Bilateral   . Greenfield filter    . Back surgery    . Eus N/A 02/26/2014    Procedure: UPPER ENDOSCOPIC ULTRASOUND (EUS) LINEAR;  Surgeon: Beryle Beams, MD;  Location: [facility];  Service: Endoscopy;  Laterality: N/A;  . Fine needle aspiration N/A 02/26/2014    Procedure: FINE NEEDLE  ASPIRATION (FNA) LINEAR;  Surgeon: Beryle Beams, MD;  Location: [facility];  Service: Endoscopy;  Laterality: N/A;  . Breast lumpectomy Right   . Spine surgery    . Xi robotic assisted laparoscopic distal pancreatectomy N/A 04/08/2014    Procedure: XI ROBOTIC ASSISTED CONVERTED LAPAROSCOPIC HAND ASSITED DISTAL PANCREATECTOMY/SPLENECTOMY WITH PARTIAL COLECTOMY;  Surgeon: [name], MD;  Location: [facility];  Service: General;  Laterality: N/A;  . Colon surgery    . Diagnostic laparoscopy     Family History  Problem Relation Age of Onset  . Diabetes Father   . Cancer Father 78    prostate  . Hyperlipidemia Father   . Cataracts Maternal Grandmother   . Hypertension Mother   . Arthritis Mother   . Heart disease Mother   . Glaucoma Sister   . Cancer Sister 20    Breast  . Cancer Paternal Uncle     throat +cig  . Diabetes Paternal Grandmother    History  Substance Use Topics  . Smoking status: Current Some Day Smoker -- 0.50 packs/day for 45 years    Types: Cigarettes  . Smokeless tobacco: Never Used  . Alcohol Use: 0.0 oz/week    0 Standard drinks or equivalent per week     Comment: occ- glass of brandy or wine socially    OB History    No  data available     Review of Systems    Allergies  Sulfa antibiotics  Home Medications   Prior to Admission medications   Medication Sig Start Date End Date Taking? Authorizing Provider  alum & mag hydroxide-simeth (MAALOX/MYLANTA) 200-200-20 MG/5ML suspension Take 15 mLs by mouth every 6 (six) hours as needed for indigestion or heartburn.   Yes Historical Provider, MD  Alum & Mag Hydroxide-Simeth (MAGIC MOUTHWASH) SOLN Take 5 mLs by mouth 3 (three) times daily as needed for mouth pain.   Yes Historical Provider, MD  atorvastatin (LIPITOR) 40 MG tablet Take 1 tablet (40 mg total) by mouth daily. 05/05/14  Yes Gerlene Fee, NP  bismuth subsalicylate (PEPTO BISMOL) 262 MG/15ML suspension Take 30 mLs by mouth every 4 (four) hours as  needed for indigestion.   Yes Historical Provider, MD  brimonidine-timolol (COMBIGAN) 0.2-0.5 % ophthalmic solution Place 1 drop into the left eye every 12 (twelve) hours.   Yes Historical Provider, MD  ciprofloxacin (CIPRO) 500 MG tablet Take 1 tablet (500 mg total) by mouth 2 (two) times daily. 05/21/14  Yes Bonnielee Haff, MD  diltiazem (CARTIA XT) 120 MG 24 hr capsule Take 1 capsule (120 mg total) by mouth daily. 05/05/14  Yes Gerlene Fee, NP  enoxaparin (LOVENOX) 120 MG/0.8ML injection Inject 0.86 mLs (130 mg total) into the skin daily. 05/21/14  Yes Bonnielee Haff, MD  latanoprost (XALATAN) 0.005 % ophthalmic solution Place 1 drop into both eyes at bedtime.   Yes Historical Provider, MD  loperamide (IMODIUM) 2 MG capsule Take 2 mg by mouth daily as needed for diarrhea or loose stools.   Yes Historical Provider, MD  metoprolol succinate (TOPROL-XL) 25 MG 24 hr tablet Take 0.5 tablets (12.5 mg total) by mouth daily. 05/21/14  Yes Bonnielee Haff, MD  metroNIDAZOLE (FLAGYL) 500 MG tablet Take 1 tablet (500 mg total) by mouth every 8 (eight) hours. 05/21/14  Yes Bonnielee Haff, MD  mirtazapine (REMERON) 15 MG tablet Take 1 tablet (15 mg total) by mouth at bedtime. 05/27/14  Yes Truitt Merle, MD  pantoprazole (PROTONIX) 40 MG tablet Take 1 tablet (40 mg total) by mouth 2 (two) times daily before a meal. 05/21/14  Yes Bonnielee Haff, MD  primidone (MYSOLINE) 50 MG tablet Take 50 mg by mouth at bedtime. Patient takes at nite   Yes Historical Provider, MD  promethazine (PHENERGAN) 25 MG tablet Take 25 mg by mouth every 6 (six) hours as needed for nausea or vomiting.   Yes Historical Provider, MD  sucralfate (CARAFATE) 1 GM/10ML suspension Take 10 mLs (1 g total) by mouth 4 (four) times daily -  with meals and at bedtime. 05/04/14  Yes [name], MD  tamsulosin (FLOMAX) 0.4 MG CAPS capsule Take 1 capsule (0.4 mg total) by mouth daily. 05/04/14  Yes [name], MD  HYDROcodone-acetaminophen (NORCO) 10-325 MG per  tablet Take 1/2 tablet by mouth every 6 hours as needed for moderate pain. Do not exceed 4gm of Tylenol in 24h Patient taking differently: Take 0.5 tablets by mouth every 6 (six) hours as needed for moderate pain. Take 1/2 tablet by mouth every 6 hours as needed for moderate pain. Do not exceed 4gm of Tylenol in 24h 05/24/14   Tiffany L Reed, DO  saccharomyces boulardii (FLORASTOR) 250 MG capsule Take 1 capsule (250 mg total) by mouth 2 (two) times daily. Patient not taking: Reported on 05/29/2014 05/21/14   Bonnielee Haff, MD   BP 123/47 mmHg  Pulse 98  Temp(Src)  97.9 F (36.6 C) (Oral)  Resp 18  Ht 5\' 9"  (1.753 m)  Wt 182 lb (82.555 kg)  BMI 26.86 kg/m2  SpO2 95% Physical Exam  Constitutional: She is oriented to person, place, and time. She appears well-developed and well-nourished. No distress.  HENT:  Head: Normocephalic and atraumatic.  Nose: Nose normal.  Mouth/Throat: Oropharynx is clear and moist. No oropharyngeal exudate.  Eyes: Conjunctivae and EOM are normal. Pupils are equal, round, and reactive to light. No scleral icterus.  Neck: Normal range of motion. Neck supple. No JVD present. No tracheal deviation present. No thyromegaly present.  Cardiovascular: Normal rate, regular rhythm and normal heart sounds.  Exam reveals no gallop and no friction rub.   No murmur heard. Pulmonary/Chest: Effort normal and breath sounds normal. No respiratory distress. She has no wheezes. She exhibits no tenderness.  Abdominal: Soft. Bowel sounds are normal. She exhibits no distension and no mass. There is tenderness. There is no rebound and no guarding.  JP drain can be seen coming from the left upper quadrant. Output is dark red. Does not appear to be stool. Area is clean dry and intact. There is appropriate tenderness to palpation.  Musculoskeletal: Normal range of motion. She exhibits no edema or tenderness.  Lymphadenopathy:    She has no cervical adenopathy.  Neurological: She is alert and  oriented to person, place, and time. No cranial nerve deficit. She exhibits normal muscle tone.  Skin: Skin is warm and dry. No rash noted. She is not diaphoretic. No erythema. No pallor.  Nursing note and vitals reviewed.   ED Course  Procedures (including critical care time) Labs Review Labs Reviewed  CBC WITH DIFFERENTIAL/PLATELET - Abnormal; Notable for the following:    RBC 3.28 (*)    Hemoglobin 9.2 (*)    HCT 29.8 (*)    RDW 16.7 (*)    Platelets 526 (*)    All other components within normal limits  COMPREHENSIVE METABOLIC PANEL - Abnormal; Notable for the following:    Potassium 2.7 (*)    Glucose, Bld 101 (*)    BUN 5 (*)    Albumin 3.1 (*)    AST 38 (*)    GFR calc non Af Amer 86 (*)    All other components within normal limits  URINALYSIS, ROUTINE W REFLEX MICROSCOPIC - Abnormal; Notable for the following:    Specific Gravity, Urine 1.038 (*)    All other components within normal limits  LIPASE, BLOOD  TROPONIN I    Imaging Review Ct Chest W Contrast  05/29/2014   CLINICAL DATA:  Abdominal pain for 3 days. Left upper quadrant drainage 2. Nausea and vomiting for 1 day. Distal pancreatectomy, splenectomy, and partial colectomy 04/08/2014 for pancreatic carcinoma.  EXAM: CT CHEST, ABDOMEN, AND PELVIS WITH CONTRAST  TECHNIQUE: Multidetector CT imaging of the chest, abdomen and pelvis was performed following the standard protocol during bolus administration of intravenous contrast.  CONTRAST:  3mL OMNIPAQUE IOHEXOL 300 MG/ML SOLN, 166mL OMNIPAQUE IOHEXOL 300 MG/ML SOLN  COMPARISON:  Chest CT 04/1914.  Abdominal pelvic CT 05/11/2014.  FINDINGS: CT CHEST FINDINGS  Mediastinum/Nodes: No supraclavicular adenopathy. Tortuous thoracic aorta. Normal heart size, without pericardial effusion. No central pulmonary embolism, on this non-dedicated study. No mediastinal or hilar adenopathy.  Lungs/Pleura: A small left pleural effusion is similar. Focus of increased density within the left  pleural space is suspicious for pleural implant. 1.6 cm on image 40 of series 2  Persistent left base collapse/consolidative change.  Musculoskeletal: No acute osseous abnormality.  CT ABDOMEN PELVIS FINDINGS  Hepatobiliary: Right hepatic lobe complex cyst is unchanged. Cholecystectomy with chronic mild common duct dilatation. No obstructive cause identified.  Pancreas: Distal pancreatectomy. The pancreatic duct is mildly prominent within the head neck. Example 4 mm on image 62. Felt to be similar to on prior exams. Percutaneous drain in the operative bed. No well-defined fluid collection. Soft tissue fullness is presumably due to postsurgical change posterior to the stomach on image 59 of series 2.  Spleen: Surgically absent  Adrenals/Urinary Tract: Normal adrenal glands. Interpolar left renal cyst. Too small to characterize right renal lesion. No hydronephrosis. Urinary bladder collapsed around a Foley catheter.  Stomach/Bowel: Irregularity about the posterior wall stomach is likely due to adhesion at the operative site. Scattered colonic diverticula. Surgical changes in the splenic flexure colon. Normal terminal ileum. Normal small bowel.  Vascular/Lymphatic: Aortic and branch vessel atherosclerosis. IVC filter terminating below the renal veins. No abdominopelvic adenopathy.  Reproductive: Hysterectomy.  No adnexal mass.  Other: No significant free fluid. Left upper quadrant ill-defined edema is likely postoperative. No well-defined omental/ peritoneal metastasis.  Musculoskeletal: No acute osseous abnormality.  IMPRESSION: 1. Similar left-sided pleural effusion. Suspicion of a pleural implant for which metastatic disease is a concern. If thoracentesis has not been performed, this should be considered. PET may also be informative. 2. Left base collapse/consolidative change, favored to represent atelectasis. This is similar. 3. Extensive left upper quadrant surgical changes with a percutaneous drain in place. No  residual fluid collection. Edema and presumed postsurgical scarring. 4. Common duct and pancreatic duct dilatation within the body and neck. Felt to be similar to on prior exams. Recommend attention on follow-up.   Electronically Signed   By: Abigail Miyamoto M.D.   On: 05/29/2014 02:46   Ct Abdomen Pelvis W Contrast  05/29/2014   CLINICAL DATA:  Abdominal pain for 3 days. Left upper quadrant drainage 2. Nausea and vomiting for 1 day. Distal pancreatectomy, splenectomy, and partial colectomy 04/08/2014 for pancreatic carcinoma.  EXAM: CT CHEST, ABDOMEN, AND PELVIS WITH CONTRAST  TECHNIQUE: Multidetector CT imaging of the chest, abdomen and pelvis was performed following the standard protocol during bolus administration of intravenous contrast.  CONTRAST:  40mL OMNIPAQUE IOHEXOL 300 MG/ML SOLN, 14mL OMNIPAQUE IOHEXOL 300 MG/ML SOLN  COMPARISON:  Chest CT 04/1914.  Abdominal pelvic CT 05/11/2014.  FINDINGS: CT CHEST FINDINGS  Mediastinum/Nodes: No supraclavicular adenopathy. Tortuous thoracic aorta. Normal heart size, without pericardial effusion. No central pulmonary embolism, on this non-dedicated study. No mediastinal or hilar adenopathy.  Lungs/Pleura: A small left pleural effusion is similar. Focus of increased density within the left pleural space is suspicious for pleural implant. 1.6 cm on image 40 of series 2  Persistent left base collapse/consolidative change.  Musculoskeletal: No acute osseous abnormality.  CT ABDOMEN PELVIS FINDINGS  Hepatobiliary: Right hepatic lobe complex cyst is unchanged. Cholecystectomy with chronic mild common duct dilatation. No obstructive cause identified.  Pancreas: Distal pancreatectomy. The pancreatic duct is mildly prominent within the head neck. Example 4 mm on image 62. Felt to be similar to on prior exams. Percutaneous drain in the operative bed. No well-defined fluid collection. Soft tissue fullness is presumably due to postsurgical change posterior to the stomach on  image 59 of series 2.  Spleen: Surgically absent  Adrenals/Urinary Tract: Normal adrenal glands. Interpolar left renal cyst. Too small to characterize right renal lesion. No hydronephrosis. Urinary bladder collapsed around a Foley catheter.  Stomach/Bowel: Irregularity about the posterior  wall stomach is likely due to adhesion at the operative site. Scattered colonic diverticula. Surgical changes in the splenic flexure colon. Normal terminal ileum. Normal small bowel.  Vascular/Lymphatic: Aortic and branch vessel atherosclerosis. IVC filter terminating below the renal veins. No abdominopelvic adenopathy.  Reproductive: Hysterectomy.  No adnexal mass.  Other: No significant free fluid. Left upper quadrant ill-defined edema is likely postoperative. No well-defined omental/ peritoneal metastasis.  Musculoskeletal: No acute osseous abnormality.  IMPRESSION: 1. Similar left-sided pleural effusion. Suspicion of a pleural implant for which metastatic disease is a concern. If thoracentesis has not been performed, this should be considered. PET may also be informative. 2. Left base collapse/consolidative change, favored to represent atelectasis. This is similar. 3. Extensive left upper quadrant surgical changes with a percutaneous drain in place. No residual fluid collection. Edema and presumed postsurgical scarring. 4. Common duct and pancreatic duct dilatation within the body and neck. Felt to be similar to on prior exams. Recommend attention on follow-up.   Electronically Signed   By: Abigail Miyamoto M.D.   On: 05/29/2014 02:46   Ir Sinus/fist Tube Chk-non Gi  05/27/2014   CLINICAL DATA:  69 year old with history of distal pancreatectomy, splenectomy and partial colectomy. Patient has an existing pigtail drain in the left upper quadrant. Patient presents for follow-up of this drain and left upper abdominal abscess. The patient reports minimal output from the drain but there is purulent greenish fluid in the drainage bulb.   EXAM: SINUS TRACT INJECTION/FISTULOGRAM; IR CATHETER TUBE CHANGE  Physician: Stephan Minister. Henn, MD  FLUOROSCOPY TIME:  6 minutes and 36 seconds, 205 mGy.  MEDICATIONS: 50 mcg fentanyl IV  ANESTHESIA/SEDATION: Patient was monitored by a radiology nurse throughout the procedure.  PROCEDURE: The drain was injected with contrast under fluoroscopy. There is a residual collection caudal to the drain which is not resolving. These findings were discussed with the patient and her daughter. Informed consent was obtained for a drain exchange and repositioning.  The left abdomen was prepped and draped in sterile fashion. Maximal barrier sterile technique was utilized including caps, mask, sterile gowns, sterile gloves, sterile drape, hand hygiene and skin antiseptic. Catheter was cut and removed over a Bentson wire. 5 French catheter was manipulated into the caudal collection with a Glidewire. Yellow purulent fluid was aspirated. A 10.2 [place] multipurpose drain was advanced into this collection over a Bentson wire. Approximately 12 mL of yellow purulent fluid was removed. Cavity was completely decompressed. Catheter was sutured to the skin and attached to a suction bulb.  FINDINGS: The left upper abdominal pigtail catheter was in a stable position. Minimal fluid around the pigtail drain but there was a track to a caudal collection which was present on the previous examination. This collection had not significant changed from the recent injection on 05/17/2014. A new drain were advanced into the caudal collection. Yellow purulent fluid was aspirated.  Estimated blood loss:  None  COMPLICATIONS: None  IMPRESSION: Small residual abscess cavity inferior to the existing drain. The drain was exchanged and repositioned within the abscess cavity. Yellow purulent fluid was removed from this collection.  Patient will be scheduled for a follow-up CT of the abdomen with IV contrast and drain injection in 1-2 weeks.   Electronically Signed    By: Markus Daft M.D.   On: 05/27/2014 13:25   Ir Catheter Tube Change  05/27/2014   CLINICAL DATA:  69 year old with history of distal pancreatectomy, splenectomy and partial colectomy. Patient has an existing pigtail drain in  the left upper quadrant. Patient presents for follow-up of this drain and left upper abdominal abscess. The patient reports minimal output from the drain but there is purulent greenish fluid in the drainage bulb.  EXAM: SINUS TRACT INJECTION/FISTULOGRAM; IR CATHETER TUBE CHANGE  Physician: Stephan Minister. Henn, MD  FLUOROSCOPY TIME:  6 minutes and 36 seconds, 205 mGy.  MEDICATIONS: 50 mcg fentanyl IV  ANESTHESIA/SEDATION: Patient was monitored by a radiology nurse throughout the procedure.  PROCEDURE: The drain was injected with contrast under fluoroscopy. There is a residual collection caudal to the drain which is not resolving. These findings were discussed with the patient and her daughter. Informed consent was obtained for a drain exchange and repositioning.  The left abdomen was prepped and draped in sterile fashion. Maximal barrier sterile technique was utilized including caps, mask, sterile gowns, sterile gloves, sterile drape, hand hygiene and skin antiseptic. Catheter was cut and removed over a Bentson wire. 5 French catheter was manipulated into the caudal collection with a Glidewire. Yellow purulent fluid was aspirated. A 10.2 [place] multipurpose drain was advanced into this collection over a Bentson wire. Approximately 12 mL of yellow purulent fluid was removed. Cavity was completely decompressed. Catheter was sutured to the skin and attached to a suction bulb.  FINDINGS: The left upper abdominal pigtail catheter was in a stable position. Minimal fluid around the pigtail drain but there was a track to a caudal collection which was present on the previous examination. This collection had not significant changed from the recent injection on 05/17/2014. A new drain were advanced into the  caudal collection. Yellow purulent fluid was aspirated.  Estimated blood loss:  None  COMPLICATIONS: None  IMPRESSION: Small residual abscess cavity inferior to the existing drain. The drain was exchanged and repositioned within the abscess cavity. Yellow purulent fluid was removed from this collection.  Patient will be scheduled for a follow-up CT of the abdomen with IV contrast and drain injection in 1-2 weeks.   Electronically Signed   By: Markus Daft M.D.   On: 05/27/2014 13:25     EKG Interpretation None      MDM   Final diagnoses:  Abdominal pain    Patient presents to the emergency department for worsening pain at her JP drain site. Will obtain CT scan to assess for displacement of the drain. Patient was given morphine and Zofran for relief. Second dose of Zofran was ordered for her throughout the ED stay. CT scan shows no concern of placement of JP drain. There is a left sided pleural effusion. There is also spots concerning for metastasis. This is explained to the patient. Daughter is taking the patient home, will provide daughter with antibiotics that was given to her at nursing facility, Zofran, pain medications for the patient.  Patient has follow-up during the first week of May, she is advised to attend this appointment for continued management. Her vital signs were within her normal limits and she is safe for discharge.    Everlene Balls, MD 05/29/14 [id number]

## 2014-05-29 NOTE — ED Notes (Signed)
Patient presents from [facility] via EMS for abdominal pain x2-3 days. Patient has drainage tube in LUQ. N/V x1 day, 7/10 abdominal pain.    Last VS: 130/70, 100Hr, 97% RA, 14Resp.

## 2014-05-29 NOTE — Discharge Instructions (Signed)
[facility] Care Ms. Pellecchia, your CT scan results are below. Follow-up with your surgeon during her next appointment for continued care. Obtain a CT scan of your chest within 3-6 months to assess for possible spread of cancer. Take antibiotics and pain medication as prescribed. If symptoms worsen come back to the emergency department immediately. Thank you.  IMPRESSION: 1. Similar left-sided pleural effusion. Suspicion of a pleural implant for which metastatic disease is a concern. If thoracentesis has not been performed, this should be considered. PET may also be informative. 2. Left base collapse/consolidative change, favored to represent atelectasis. This is similar. 3. Extensive left upper quadrant surgical changes with a percutaneous drain in place. No residual fluid collection. Edema and presumed postsurgical scarring. 4. Common duct and pancreatic duct dilatation within the body and neck. Felt to be similar to on prior exams. Recommend attention on follow-up.   A bulb drain consists of a thin rubber tube and a soft, round bulb that creates a gentle suction. The rubber tube is placed in the area where you had surgery. A bulb is attached to the end of the tube that is outside the body. The bulb drain removes excess fluid that normally builds up in a surgical wound after surgery. The color and amount of fluid will vary. Immediately after surgery, the fluid is bright red and is a little thicker than water. It may gradually change to a yellow or pink color and become more thin and water-like. When the amount decreases to about 1 or 2 tbsp in 24 hours, your health care provider will usually remove it. DAILY CARE  Keep the bulb flat (compressed) at all times, except while emptying it. The flatness creates suction. You can flatten the bulb by squeezing it firmly in the middle and then closing the cap.  Keep sites where the tube enters the skin dry and covered with a bandage  (dressing).  Secure the tube 1-2 in (2.5-5.1 cm) below the insertion sites to keep it from pulling on your stitches. The tube is stitched in place and will not slip out.  Secure the bulb as directed by your health care provider.  For the first 3 days after surgery, there usually is more fluid in the bulb. Empty the bulb whenever it becomes half full because the bulb does not create enough suction if it is too full. The bulb could also overflow. Write down how much fluid you remove each time you empty your drain. Add up the amount removed in 24 hours.  Empty the bulb at the same time every day once the amount of fluid decreases and you only need to empty it once a day. Write down the amounts and the 24-hour totals to give to your health care provider. This helps your health care provider know when the tubes can be removed. EMPTYING THE BULB DRAIN Before emptying the bulb, get a measuring cup, a piece of paper and a pen, and wash your hands.  Gently run your fingers down the tube (stripping) to empty any drainage from the tubing into the bulb. This may need to be done several times a day to clear the tubing of clots and tissue.  Open the bulb cap to release suction, which causes it to inflate. Do not touch the inside of the cap.  Gently run your fingers down the tube (stripping) to empty any drainage from the tubing into the bulb.  Hold the cap out of the way, and pour fluid into the  measuring cup.   Squeeze the bulb to provide suction.  Replace the cap.   Check the tape that holds the tube to your skin. If it is becoming loose, you can remove the loose piece of tape and apply a new one. Then, pin the bulb to your shirt.   Write down the amount of fluid you emptied out. Write down the date and each time you emptied your bulb drain. (If there are 2 bulbs, note the amount of drainage from each bulb and keep the totals separate. Your health care provider will want to know the total amounts  for each drain and which tube is draining more.)   Flush the fluid down the toilet and wash your hands.   Call your health care provider once you have less than 2 tbsp of fluid collecting in the bulb drain every 24 hours. If there is drainage around the tube site, change dressings and keep the area dry. Cleanse around tube with sterile saline and place dry gauze around site. This gauze should be changed when it is soiled. If it stays clean and unsoiled, it should still be changed daily.  SEEK MEDICAL CARE IF:  Your drainage has a bad smell or is cloudy.   You have a fever.   Your drainage is increasing instead of decreasing.   Your tube fell out.   You have redness or swelling around the tube site.   You have drainage from a surgical wound.   Your bulb drain will not stay flat after you empty it.  MAKE SURE YOU:   Understand these instructions.  Will watch your condition.  Will get help right away if you are not doing well or get worse. Document Released: 01/13/2000 Document Revised: 06/01/2013 Document Reviewed: 06/20/2011 [facility] Patient Information 2015 [place], [place]. This information is not intended to replace advice given to you by your health care provider. Make sure you discuss any questions you have with your health care provider.

## 2014-05-29 NOTE — ED Notes (Signed)
Bed: WA06 Expected date:  Expected time:  Means of arrival:  Comments: ems 

## 2014-05-29 NOTE — ED Notes (Signed)
[place] from lab called to report a K+ of 2.7 RN [facility] notified.

## 2014-05-31 ENCOUNTER — Telehealth (HOSPITAL_COMMUNITY): Payer: Self-pay

## 2014-06-08 ENCOUNTER — Inpatient Hospital Stay: Payer: Medicare Other | Admitting: Family

## 2014-06-09 ENCOUNTER — Ambulatory Visit (INDEPENDENT_AMBULATORY_CARE_PROVIDER_SITE_OTHER): Payer: Medicare Other | Admitting: Internal Medicine

## 2014-06-09 ENCOUNTER — Other Ambulatory Visit (INDEPENDENT_AMBULATORY_CARE_PROVIDER_SITE_OTHER): Payer: Medicare Other

## 2014-06-09 ENCOUNTER — Encounter: Payer: Self-pay | Admitting: Internal Medicine

## 2014-06-09 VITALS — HR 89 | Temp 98.2°F | Ht 69.0 in | Wt 183.5 lb

## 2014-06-09 DIAGNOSIS — C252 Malignant neoplasm of tail of pancreas: Secondary | ICD-10-CM

## 2014-06-09 DIAGNOSIS — J948 Other specified pleural conditions: Secondary | ICD-10-CM | POA: Diagnosis not present

## 2014-06-09 DIAGNOSIS — R0781 Pleurodynia: Secondary | ICD-10-CM

## 2014-06-09 DIAGNOSIS — E876 Hypokalemia: Secondary | ICD-10-CM

## 2014-06-09 DIAGNOSIS — J90 Pleural effusion, not elsewhere classified: Secondary | ICD-10-CM

## 2014-06-09 MED ORDER — HYDROCODONE-ACETAMINOPHEN 10-325 MG PO TABS: 1.0000 | ORAL_TABLET | ORAL | Status: DC | PRN

## 2014-06-09 NOTE — Patient Instructions (Signed)
  Your next office appointment will be determined based upon review of your pending labs  and  xrays  Those instructions will be transmitted to you by My Chart  Critical results will be called.   Followup as needed for any active or acute issue. Please report any significant change in your symptoms. 

## 2014-06-09 NOTE — Progress Notes (Signed)
Pre visit review using our clinic review tool, if applicable. No additional management support is needed unless otherwise documented below in the visit note. 

## 2014-06-09 NOTE — Progress Notes (Signed)
   Subjective:    Patient ID: [name], female    DOB: 08-30-1945, 69 y.o.   MRN: [id number]  HPI  Her complicated hospital course was reviewed. She was actually hospitalized the second time 4/10-4/22/16 and then sent to rehabilitation. She returned to the ER 4/30 with abdominal pain.She was discharged home from the ER.K+ was 2.7; she is on no supplements.  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  She is having significant pleuritic pain on the left described as aching ,worse with deep breathing. Some foul smelling material is drainng from the chest tube.She describes exertional dyspnea.  She does have anorexia.She describes 2 soft bowel movements a day.  She recently struck her right upper extremity on a chair and sustained a knot in the triceps area.  Review of Systems Epistaxis, hemoptysis, hematuria, melena, or rectal bleeding denied. No unexplained weight loss, significant dyspepsia,dysphagia, or abdominal pain.  There is no abnormal bruising , bleeding, or difficulty stopping bleeding with injury despite the Lovenox    Objective:   Physical Exam Pertinent or positive findings include: Arcus senilis is present.  She has a S4.  Breath sounds are decreased. She does splint on the left. There is a 6 x 5.5 hematoma in the right triceps area.  Pedal pulses are decreased.  General appearance :adequately nourished; in no distress. Eyes: No conjunctival inflammation or scleral icterus is present. Heart:  Normal rate and regular rhythm. S1 and S2 normal without gallop, murmur, click, or rub.   Lungs:No increased work of breathing.  Abdomen: bowel sounds decreased, soft and non-tender without masses, organomegaly or hernias noted.  No guarding  or rebound.  Vascular : all pulses equal ; no bruits present. Skin:Warm & dry.  Intact without suspicious lesions or rashes ; no tenting Lymphatic: No lymphadenopathy is noted about the head, neck, axilla Neuro: Strength, tone  normal.       Assessment & Plan:  #1 pleuritic pain in the context loculated left effusion with malodorous discharge  #2  hypokalemia  Plan: See orders recommendation

## 2014-06-10 LAB — BASIC METABOLIC PANEL
BUN: 6 mg/dL (ref 6–23)
CO2: 24 mEq/L (ref 19–32)
Calcium: 9.1 mg/dL (ref 8.4–10.5)
Chloride: 105 mEq/L (ref 96–112)
Creatinine, Ser: 0.98 mg/dL (ref 0.40–1.20)
GFR: 72.7 mL/min (ref >60.00)
Glucose, Bld: 115 mg/dL — ABNORMAL HIGH (ref 70–99)
POTASSIUM: 4.2 meq/L (ref 3.5–5.1)
SODIUM: 137 meq/L (ref 135–145)

## 2014-06-11 ENCOUNTER — Other Ambulatory Visit (HOSPITAL_COMMUNITY): Payer: Self-pay | Admitting: Interventional Radiology

## 2014-06-11 DIAGNOSIS — K651 Peritoneal abscess: Secondary | ICD-10-CM

## 2014-06-11 DIAGNOSIS — C259 Malignant neoplasm of pancreas, unspecified: Secondary | ICD-10-CM

## 2014-06-11 DIAGNOSIS — L0291 Cutaneous abscess, unspecified: Secondary | ICD-10-CM

## 2014-06-14 ENCOUNTER — Other Ambulatory Visit (HOSPITAL_COMMUNITY): Payer: Self-pay | Admitting: Interventional Radiology

## 2014-06-14 DIAGNOSIS — K651 Peritoneal abscess: Secondary | ICD-10-CM

## 2014-06-14 DIAGNOSIS — C259 Malignant neoplasm of pancreas, unspecified: Secondary | ICD-10-CM

## 2014-06-15 ENCOUNTER — Ambulatory Visit (HOSPITAL_COMMUNITY)
Admission: RE | Admit: 2014-06-15 | Discharge: 2014-06-15 | Disposition: A | Discharge status: Home / Self Care | Payer: Medicare Other | Source: Ambulatory Visit | Attending: Interventional Radiology | Admitting: Interventional Radiology

## 2014-06-15 ENCOUNTER — Encounter (HOSPITAL_COMMUNITY): Payer: Self-pay

## 2014-06-15 ENCOUNTER — Other Ambulatory Visit (HOSPITAL_COMMUNITY): Payer: Self-pay | Admitting: Interventional Radiology

## 2014-06-15 DIAGNOSIS — K651 Peritoneal abscess: Secondary | ICD-10-CM

## 2014-06-15 DIAGNOSIS — Z90411 Acquired partial absence of pancreas: Secondary | ICD-10-CM | POA: Insufficient documentation

## 2014-06-15 DIAGNOSIS — C259 Malignant neoplasm of pancreas, unspecified: Secondary | ICD-10-CM | POA: Insufficient documentation

## 2014-06-15 DIAGNOSIS — Z9081 Acquired absence of spleen: Secondary | ICD-10-CM | POA: Diagnosis not present

## 2014-06-15 DIAGNOSIS — Z4803 Encounter for change or removal of drains: Secondary | ICD-10-CM | POA: Diagnosis not present

## 2014-06-15 DIAGNOSIS — Z4889 Encounter for other specified surgical aftercare: Secondary | ICD-10-CM | POA: Insufficient documentation

## 2014-06-15 DIAGNOSIS — Z9049 Acquired absence of other specified parts of digestive tract: Secondary | ICD-10-CM | POA: Insufficient documentation

## 2014-06-15 MED ORDER — IOHEXOL 300 MG/ML  SOLN
50.0000 mL | Freq: Once | INTRAMUSCULAR | Status: AC | PRN
  Administered 2014-06-15: 10 mL via INTRAVENOUS

## 2014-06-15 MED ORDER — IOHEXOL 300 MG/ML  SOLN
100.0000 mL | Freq: Once | INTRAMUSCULAR | Status: AC | PRN
  Administered 2014-06-15: 100 mL via INTRAVENOUS

## 2014-06-15 NOTE — Procedures (Signed)
Review of proceeding CT demonstrates resolution of intra-abdominal abscess. Contrast injection was negative for fistulous connection. Drain removed intact. Please see full radiology report dictations for details.

## 2014-06-24 ENCOUNTER — Encounter: Payer: Self-pay | Admitting: Internal Medicine

## 2014-06-24 NOTE — Progress Notes (Signed)
Patient ID: [name], female   DOB: 04-18-1945, 69 y.o.   MRN: [id number]    HISTORY AND PHYSICAL   DATE: 05/06/14  Location:  [facility]    Place of Service: SNF [phone])   Extended Emergency Contact Information Primary Emergency Contact: Qianna, Clagett Address: [address]          [place], [place] [address] Johnnette Litter of [place] Phone: [phone] Mobile Phone: [phone] Relation: Daughter Secondary Emergency Contact: [facility] Address: [address]          [place], [place] [address] Johnnette Litter of [place] Phone: [phone] Mobile Phone: [phone] Relation: Son  Advanced Directive information  FULL CODE  Chief Complaint  Patient presents with  . New Admit To SNF    HPI:  69 yo female seen today as a new admission into SNF following hospital stay for pancreatic cancer, protein calorie malnutrition, chronic DVT, HTN. She is s/p extensive surgery 3/10th for pancreatic tail cancer which included partial colectomy. She was dx with abdominal abscess (Pseudomonas) and is taking doxy until 4/15th. JP drain x 2 present. She is receiving lovenox injections for DVT. During hospital course, she req'd TPN due to poor po intake. She also had urinary retention that req'd placement of foley x 3. She was d/c'd with foley and scheduled to see urology today  BP controlled on diltiazem and propanolol.  She takes a statin for cholesterol  PPI controls GERD sx's. She is also taking carafate  Pain is stable with tramadol  Past Medical History  Diagnosis Date  . Glaucoma   . Clotting disorder   . DVT (deep venous thrombosis)     LEFT  . GERD (gastroesophageal reflux disease)   . Hypercholesterolemia   . Peripheral neuropathy   . Essential tremor   . Diverticulosis   . Arthritis     knees and back  . Cancer     Pancreatic  . Hypertension   . Colon polyps   . UTI (lower urinary tract infection)   . Complication of anesthesia    slow to wak up   . Pulmonary embolism     Past Surgical History  Procedure Laterality Date  . Cholecystectomy    . Abdominal hysterectomy    . Knee surgery Bilateral   . Greenfield filter    . Back surgery    . Eus N/A 02/26/2014    Procedure: UPPER ENDOSCOPIC ULTRASOUND (EUS) LINEAR;  Surgeon: Beryle Beams, MD;  Location: [facility];  Service: Endoscopy;  Laterality: N/A;  . Fine needle aspiration N/A 02/26/2014    Procedure: FINE NEEDLE ASPIRATION (FNA) LINEAR;  Surgeon: Beryle Beams, MD;  Location: [facility];  Service: Endoscopy;  Laterality: N/A;  . Breast lumpectomy Right   . Spine surgery    . Xi robotic assisted laparoscopic distal pancreatectomy N/A 04/08/2014    Procedure: XI ROBOTIC ASSISTED CONVERTED LAPAROSCOPIC HAND ASSITED DISTAL PANCREATECTOMY/SPLENECTOMY WITH PARTIAL COLECTOMY;  Surgeon: [name], MD;  Location: [facility];  Service: General;  Laterality: N/A;  . Colon surgery    . Diagnostic laparoscopy      Patient Care Team: Golden Circle, FNP as PCP - General (Family Medicine) Carol Ada, MD as Consulting Physician (Gastroenterology) Truitt Merle, MD as Consulting Physician (Hematology) [name], MD as Consulting Physician (General Surgery)  History   Social History  . Marital Status: Divorced    Spouse Name: N/A  . Number of Children: 3  . Years of Education:  14   Occupational History  . Retired    Social History Main Topics  . Smoking status: Current Some Day Smoker -- 0.50 packs/day for 45 years    Types: Cigarettes  . Smokeless tobacco: Never Used  . Alcohol Use: 0.0 oz/week    0 Standard drinks or equivalent per week     Comment: occ- glass of brandy or wine socially   . Drug Use: No  . Sexual Activity: Not on file   Other Topics Concern  . Not on file   Social History Narrative   Divorced, 3 children   Right handed   Associates degree   3 per week   Fun: Sleep, watch tv, reading   Denies any religious beliefs that effect  health care     reports that she has been smoking Cigarettes.  She has a 22.5 pack-year smoking history. She has never used smokeless tobacco. She reports that she drinks alcohol. She reports that she does not use illicit drugs.  Family History  Problem Relation Age of Onset  . Diabetes Father   . Cancer Father 61    prostate  . Hyperlipidemia Father   . Cataracts Maternal Grandmother   . Hypertension Mother   . Arthritis Mother   . Heart disease Mother   . Glaucoma Sister   . Cancer Sister 50    Breast  . Cancer Paternal Uncle     throat +cig  . Diabetes Paternal Grandmother    Family Status  Relation Status Death Age  . Father Deceased 25    Prostate/Bladder Cancer  . Maternal Grandmother Deceased   . Mother Deceased 82    Heart problems  . Sister Alive     breast cancer  . Daughter Alive     healthy  . Son Alive     asthma, hypercholesterolemia, OSA  . Maternal Grandfather Deceased   . Paternal Grandmother Deceased   . Paternal Grandfather Deceased   . Son Alive     healthy    Immunization History  Administered Date(s) Administered  . Influenza,inj,Quad PF,36+ Mos 12/11/2013  . Influenza-Unspecified 12/08/2012  . Meningococcal Conjugate 03/30/2014  . Pneumococcal Polysaccharide-23 09/30/2010  . Pneumococcal-Unspecified 12/08/2012  . Zoster 09/30/2010    Allergies  Allergen Reactions  . Sulfa Antibiotics Swelling    Medications: Patient's Medications  New Prescriptions   No medications on file  Previous Medications   ATORVASTATIN (LIPITOR) 40 MG TABLET    Take 1 tablet (40 mg total) by mouth daily.   BISMUTH SUBSALICYLATE (PEPTO BISMOL) 262 MG/15ML SUSPENSION    Take 30 mLs by mouth every 4 (four) hours as needed for indigestion.   BRIMONIDINE-TIMOLOL (COMBIGAN) 0.2-0.5 % OPHTHALMIC SOLUTION    Place 1 drop into the left eye every 12 (twelve) hours.   CIPROFLOXACIN (CIPRO) 500 MG TABLET    Take 1 tablet (500 mg total) by mouth 2 (two) times daily.    DILTIAZEM (CARTIA XT) 120 MG 24 HR CAPSULE    Take 1 capsule (120 mg total) by mouth daily.   ENOXAPARIN (LOVENOX) 120 MG/0.8ML INJECTION    Inject 0.86 mLs (130 mg total) into the skin daily.   HYDROCODONE-ACETAMINOPHEN (NORCO) 10-325 MG PER TABLET    Take 1 tablet by mouth every 4 (four) hours as needed for severe pain.   LATANOPROST (XALATAN) 0.005 % OPHTHALMIC SOLUTION    Place 1 drop into both eyes at bedtime.   METRONIDAZOLE (FLAGYL) 500 MG TABLET    Take 1 tablet (  500 mg total) by mouth 3 (three) times daily. One po bid x 7 days   MIRTAZAPINE (REMERON) 15 MG TABLET    Take 1 tablet (15 mg total) by mouth at bedtime.   PANTOPRAZOLE (PROTONIX) 40 MG TABLET    Take 1 tablet (40 mg total) by mouth 2 (two) times daily before a meal.   PRIMIDONE (MYSOLINE) 50 MG TABLET    Take 50 mg by mouth at bedtime. Patient takes at nite   PROMETHAZINE (PHENERGAN) 25 MG TABLET    Take 25 mg by mouth every 6 (six) hours as needed for nausea or vomiting.  Modified Medications   No medications on file  Discontinued Medications   No medications on file    Review of Systems  Constitutional: Positive for fatigue. Negative for fever, chills, diaphoresis, activity change and appetite change.  HENT: Negative for ear pain and sore throat.   Eyes: Negative for visual disturbance.  Respiratory: Negative for cough, chest tightness and shortness of breath.   Cardiovascular: Positive for leg swelling. Negative for chest pain and palpitations.  Gastrointestinal: Negative for nausea, vomiting, abdominal pain, diarrhea, constipation and blood in stool.  Genitourinary: Negative for dysuria.  Musculoskeletal: Positive for arthralgias.  Neurological: Positive for tremors and weakness. Negative for dizziness, numbness and headaches.  Psychiatric/Behavioral: Negative for sleep disturbance. The patient is not nervous/anxious.     Filed Vitals:   05/06/14 1633  BP: 118/80  Pulse: 76  Temp: 97.1 F (36.2 C)  SpO2: 96%     There is no weight on file to calculate BMI.  Physical Exam  Constitutional: She is oriented to person, place, and time. She appears well-developed and well-nourished. No distress.  Sitting on bed in NAD. Daughter, Jiayi, and granddaughter present.  HENT:  Mouth/Throat: Oropharynx is clear and moist and mucous membranes are normal. No oropharyngeal exudate.  Eyes: Pupils are equal, round, and reactive to light. No scleral icterus.  Neck: Neck supple. No tracheal deviation present. No thyromegaly present.  Cardiovascular: Normal rate, regular rhythm, normal heart sounds and intact distal pulses.  Exam reveals no gallop and no friction rub.   No murmur heard. +2 pitting LE edema b/l. no calf TTP. No carotid bruit b/l  Pulmonary/Chest: Effort normal. No stridor. No respiratory distress. She has no wheezes. She has rales (left base).  Reduced BS left base  Abdominal: Soft. Bowel sounds are normal. She exhibits no distension and no mass. There is tenderness. There is no rebound and no guarding.    Steri strips intact at epigastric region. Upper and lower left sided JP drains with min drainage.  Genitourinary:  Foley cath intact and DTG yellow clear urine  Musculoskeletal: She exhibits edema and tenderness.  Lymphadenopathy:    She has no cervical adenopathy.  Neurological: She is alert and oriented to person, place, and time.  Skin: Skin is warm and dry. No rash noted.  Psychiatric: She has a normal mood and affect. Her behavior is normal. Judgment and thought content normal.     Labs reviewed: Appointment on 05/06/2014  Component Date Value Ref Range Status  . CA 19-9 05/06/2014 1.2  <35.0 U/mL Final  Admission on 04/08/2014, Discharged on 05/04/2014  No results displayed because visit has over 200 results.    Ref Range 17moago (05/04/14) 138mogo (05/03/14) 65m68moo (04/30/14)     WBC 4.0 - 10.5 K/uL 8.3 9.0 10.3    RBC 3.87 - 5.11 MIL/uL 2.75 (L) 2.90 (L) 2.67 (L)  Hemoglobin 12.0 - 15.0 g/dL 8.1 (L) 8.5 (L) 7.9 (L)    HCT 36.0 - 46.0 % 25.9 (L) 27.2 (L) 24.7 (L)    MCV 78.0 - 100.0 fL 94.2 93.8 92.5    MCH 26.0 - 34.0 pg 29.5 29.3 29.6    MCHC 30.0 - 36.0 g/dL 31.3 31.3 32.0    RDW 11.5 - 15.5 % 16.8 (H) 16.1 (H) 15.3    Platelets 150 - 400 K/uL 476 (H) 460 (H) 462 (H)   Resulting Agency  [facility]      Specimen Collected: 05/04/14 4:15 AM Last Resulted: 05/04/14 5:05 AM            Ref Range 42moago (05/04/14) 110mogo (05/03/14) 73m45moo (05/02/14)    Sodium 135 - 145 mmol/L 139 139 138    Potassium 3.5 - 5.1 mmol/L 3.6 3.7 3.9    Chloride 96 - 112 mmol/L 105 106 106    CO2 19 - 32 mmol/L 26 24 25     Glucose, Bld 70 - 99 mg/dL 95 98 150 (H)    BUN 6 - 23 mg/dL 19 22 21     Creatinine, Ser 0.50 - 1.10 mg/dL 1.54 (H) 1.56 (H) 1.70 (H)    Calcium 8.4 - 10.5 mg/dL 8.6 8.6 8.6    Phosphorus 2.3 - 4.6 mg/dL 3.7 3.7 3.3    Albumin 3.5 - 5.2 g/dL 3.2 (L) 3.1 (L) 3.2 (L)    GFR calc non Af Amer >90 mL/min 34 (L) 90 mL/min" class="[email or web address]" style="cursor: pointer;" onmouseover='jscript: var varStyle="underline"; var bgColor="#[id number]"; this.style.backgroundColor=bgColor; var children=this.getElementsByTagName("div"); for(var child=0;child 33 (L) 90 mL/min" class="rz_1c" style="cursor: pointer;" onmouseover='jscript: var varStyle="underline"; var bgColor="#[id number]"; this.style.backgroundColor=bgColor; var children=this.getElementsByTagName("div"); for(var child=0;child 30 (L)    GFR calc Af Amer >90 mL/min 39 (L) 90 mL/min" class="rz_1b" style="cursor: pointer;" onmouseover='jscript: var varStyle="underline"; var bgColor="#[id number]"; this.style.backgroundColor=bgColor; var children=this.getElementsByTagName("div"); for(var child=0;child 39 (L)CM 90 mL/min" class="rz_1c" style="cursor: pointer;" onmouseover='jscript: var varStyle="underline"; var bgColor="#[id number]"; this.style.backgroundColor=bgColor; var  children=this.getElementsByTagName("div"); for(var child=0;child 35 (L)CM   Comments: (NOTE)  The eGFR has been calculated using the CKD EPI equation.  This calculation has not been validated in all clinical situations.  eGFR's persistently <90 mL/min signify possible Chronic Kidney  Disease.      Anion gap 5 - 15  8 9 7    Resulting Agency  [facility]      Specimen Collected: 05/04/14 4:15 AM Last Resulted: 05/04/14 5:11 AM          Hospital Outpatient Visit on 04/01/2014  Component Date Value Ref Range Status  . Amylase 04/01/2014 80  0 - 105 U/L Final  . WBC 04/01/2014 4.7  4.0 - 10.5 K/uL Final  . RBC 04/01/2014 4.74  3.87 - 5.11 MIL/uL Final  . Hemoglobin 04/01/2014 14.7  12.0 - 15.0 g/dL Final  . HCT 04/01/2014 44.7  36.0 - 46.0 % Final  . MCV 04/01/2014 94.3  78.0 - 100.0 fL Final  . MCH 04/01/2014 31.0  26.0 - 34.0 pg Final  . MCHC 04/01/2014 32.9  30.0 - 36.0 g/dL Final  . RDW 04/01/2014 13.2  11.5 - 15.5 % Final  . Platelets 04/01/2014 134* 150 - 400 K/uL Final  . Neutrophils Relative % 04/01/2014 53  43 - 77 % Final  . Neutro Abs 04/01/2014 2.5  1.7 - 7.7 K/uL Final  . Lymphocytes Relative 04/01/2014 37  12 - 46 % Final  . Lymphs Abs 04/01/2014 1.7  0.7 - 4.0 K/uL Final  . Monocytes Relative 04/01/2014 8  3 - 12 % Final  . Monocytes Absolute 04/01/2014 0.4  0.1 - 1.0 K/uL Final  . Eosinophils Relative 04/01/2014 2  0 - 5 % Final  . Eosinophils Absolute 04/01/2014 0.1  0.0 - 0.7 K/uL Final  . Basophils Relative 04/01/2014 0  0 - 1 % Final  . Basophils Absolute 04/01/2014 0.0  0.0 - 0.1 K/uL Final  . Prothrombin Time 04/01/2014 14.5  11.6 - 15.2 seconds Final  . INR 04/01/2014 1.11  0.00 - 1.49 Final  . ABO/RH(D) 04/01/2014 AB NEG   Final  . Antibody Screen 04/01/2014 NEG   Final  . Sample Expiration 04/01/2014 04/11/2014   Final  . Sodium 04/01/2014 142  135 - 145 mmol/L Final  . Potassium 04/01/2014 4.4  3.5 - 5.1 mmol/L Final  . Chloride  04/01/2014 107  96 - 112 mmol/L Final  . CO2 04/01/2014 28  19 - 32 mmol/L Final  . Glucose, Bld 04/01/2014 107* 70 - 99 mg/dL Final  . BUN 04/01/2014 9  6 - 23 mg/dL Final  . Creatinine, Ser 04/01/2014 0.66  0.50 - 1.10 mg/dL Final  . Calcium 04/01/2014 9.5  8.4 - 10.5 mg/dL Final  . Total Protein 04/01/2014 7.6  6.0 - 8.3 g/dL Final  . Albumin 04/01/2014 3.9  3.5 - 5.2 g/dL Final  . AST 04/01/2014 35  0 - 37 U/L Final  . ALT 04/01/2014 21  0 - 35 U/L Final  . Alkaline Phosphatase 04/01/2014 75  39 - 117 U/L Final  . Total Bilirubin 04/01/2014 0.7  0.3 - 1.2 mg/dL Final  . GFR calc non Af Amer 04/01/2014 89* >90 mL/min Final  . GFR calc Af Amer 04/01/2014 >90  >90 mL/min Final   Comment: (NOTE) The eGFR has been calculated using the CKD EPI equation. This calculation has not been validated in all clinical situations. eGFR's persistently <90 mL/min signify possible Chronic Kidney Disease.   . Anion gap 04/01/2014 7  5 - 15 Final  Appointment on 02/16/2014  Component Date Value Ref Range Status  . WBC 02/16/2014 5.4  3.9 - 10.3 10e3/uL Final  . NEUT# 02/16/2014 2.9  1.5 - 6.5 10e3/uL Final  . HGB 02/16/2014 13.7  11.6 - 15.9 g/dL Final  . HCT 02/16/2014 41.7  34.8 - 46.6 % Final  . Platelets 02/16/2014 177  145 - 400 10e3/uL Final  . MCV 02/16/2014 94.3  79.5 - 101.0 fL Final  . MCH 02/16/2014 31.0  25.1 - 34.0 pg Final  . MCHC 02/16/2014 32.9  31.5 - 36.0 g/dL Final  . RBC 02/16/2014 4.42  3.70 - 5.45 10e6/uL Final  . RDW 02/16/2014 13.1  11.2 - 14.5 % Final  . lymph# 02/16/2014 1.9  0.9 - 3.3 10e3/uL Final  . MONO# 02/16/2014 0.4  0.1 - 0.9 10e3/uL Final  . Eosinophils Absolute 02/16/2014 0.1  0.0 - 0.5 10e3/uL Final  . Basophils Absolute 02/16/2014 0.0  0.0 - 0.1 10e3/uL Final  . NEUT% 02/16/2014 54.1  38.4 - 76.8 % Final  . LYMPH% 02/16/2014 35.6  14.0 - 49.7 % Final  . MONO% 02/16/2014 8.2  0.0 - 14.0 % Final  . EOS% 02/16/2014 1.7  0.0 - 7.0 % Final  . BASO% 02/16/2014  0.4  0.0 - 2.0 % Final  . Retic % 02/16/2014 2.25* 0.70 - 2.10 % Final  . Retic Ct Abs 02/16/2014 99.45* 33.70 - 90.70 10e3/uL Final  . Immature Retic Fract 02/16/2014 4.60  1.60 - 10.00 %  Final  . Sodium 02/16/2014 140  136 - 145 mEq/L Final  . Potassium 02/16/2014 3.8  3.5 - 5.1 mEq/L Final  . Chloride 02/16/2014 104  98 - 109 mEq/L Final  . CO2 02/16/2014 27  22 - 29 mEq/L Final  . Glucose 02/16/2014 97  70 - 140 mg/dl Final  . BUN 02/16/2014 7.7  7.0 - 26.0 mg/dL Final  . Creatinine 02/16/2014 0.7  0.6 - 1.1 mg/dL Final  . Total Bilirubin 02/16/2014 0.42  0.20 - 1.20 mg/dL Final  . Alkaline Phosphatase 02/16/2014 96  40 - 150 U/L Final  . AST 02/16/2014 24  5 - 34 U/L Final  . ALT 02/16/2014 10  0 - 55 U/L Final  . Total Protein 02/16/2014 7.2  6.4 - 8.3 g/dL Final  . Albumin 02/16/2014 3.6  3.5 - 5.0 g/dL Final  . Calcium 02/16/2014 9.3  8.4 - 10.4 mg/dL Final  . Anion Gap 02/16/2014 9  3 - 11 mEq/L Final  . EGFR 02/16/2014 >90  >90 ml/min/1.73 m2 Final   eGFR is calculated using the CKD-EPI Creatinine Equation (2009)  . Results 02/16/2014 REPORT   Final   THE G20210A MUTATION NOT DETECTED  . Interpretation 02/16/2014 REPORT   Final   This individual is negative (normal) for the G20210Amutation in the Prothrombin/Factor II gene.  Increasedrisk of thrombophilia can be caused by a variety ofgenetic and non-genetic factors not screened for bythis assay.  . Reviewer 02/16/2014 REPORT   Final   Comment: Karlyne Greenspan, Ph.D., FACMGDirector, Molecular GeneticsSUPPLEMENTAL INFORMATIONThe G20210A mutation [[id number]:g.21538G>A (c.*97G>A)]in the Prothrombin/Factor II gene is the second mostcommon inherited risk factor for thrombosis occurringin  approximately 2% of Caucasians.  Presence of themutation is associated with an elevation of prothrombinlevels to about 30% above normal in heterozygotes andto 70% above normal in homozygotes.The G20210A mutation is detected by amplification ofthe    selected region of Factor II gene by polymerasechain reaction (PCR) and fluorescent probehybridization to the targeted region, followed bymelting curve analysis with a real time PCR system.Although rare, false positive or false negative resultsmay occur.   All results should be interpreted incontext of clinical findings, relevant history, andother laboratory data.Health care providers, please contact your [facility] genetic counselor or [phone]) for assistance  with                          interpretation of these results.This test was developed and its analytical performancecharacteristics have been determined by [employer], [place], [place].It has not been cleared or approved by the U.S.Food and Drug  Administration. The FDA has determinedthat such clearance or approval is not necessary.This assay has been validated pursuant to CLIAregulations and is used for clinical purposes.   . Result 02/16/2014 REPORT   Final   FACTOR V LEIDEN (R506Q) MUTATION NOT DETECTED  . Interpretation 02/16/2014 REPORT   Final   This individual is negative (normal) for the Factor VLeiden (R506Q) mutation in the Factor V gene.Increased risk of thrombophilia can be caused by avariety of genetic and non-genetic factors notscreened for by this assay.  . Reviewer 02/16/2014 REPORT   Final   Comment: Ileene Hutchinson, Ph.D., FACMGDirector, Molecular GeneticsSUPPLEMENTAL INFORMATIONThe Factor V Leiden (W295A) mutation [NM_[id number]:c.1601G>A (p.R534Q)] in the Factor V gene is one of themost common causes of inherited thrombophilia.  Thismutation  causes resistance to degradation of activatedFactor V protein by activated Protein C (APC).The Factor V Leiden (R506Q) mutation is detected byamplification of the  selected region of Factor V geneby polymerase chain reaction (PCR) and fluorescentprobe  hybridization to the targeted region, followed bymelting curve analysis with a  real time PCR system.Although rare, false positive or false negative resultsmay occur.  All results should be interpreted incontext of clinical findings, relevant history,  andother laboratory data.Health care providers, please contact your local[facility] genetic counselor or [phone]) for assistance withinterpretation of these results.This test was developed and its analytical  p                          erformancecharacteristics have been determined by [employer], [place], [place].It has not been cleared or approved by the U.S.Food and Drug Administration. The FDA has determinedthat such clearance or approval is not necessary.This  assay has been validated pursuant to CLIAregulations and is used for clinical purposes.   . AntiThromb III Func 02/16/2014 104  76 - 126 % Final      Assessment/Plan   ICD-9-CM ICD-10-CM   1. Intra-abdominal abscess s/p JP drain x 2 567.22 K65.1   2. 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 Essential hypertension - stable 401.9 I10   4. DVT (deep venous thrombosis), left - chronic; cont lovenox 453.40 I82.402   5. Peripheral neuropathy - stable 356.9 G62.9   6. Reflux - controlled 530.81 K21.9   7. Essential tremor - stable; cont primidone 333.1 G25.0     --MAR from SNF verified. Daughter and pt does not want pt taking gabapentin or cardura due to reduced renal fxn. They both were d/c'd by nephrology in the past. Both meds stopped today  --continue other meds as ordered. Finish abx  --PT/OT/ST as indicated  --f/u with urology and surgery as scheduled  --GOAL: short term rehab and d/c home when medically appropriate. Communicated with pt and nursing.  Sandor Arboleda S. Perlie Gold  [facility] [address] [place], [place] [address] [phone] Cell (Monday-Friday 8 AM - 5 PM) [phone]  After 5 PM and follow prompts

## 2014-06-30 ENCOUNTER — Telehealth: Payer: Self-pay | Admitting: Hematology

## 2014-06-30 ENCOUNTER — Ambulatory Visit (HOSPITAL_BASED_OUTPATIENT_CLINIC_OR_DEPARTMENT_OTHER): Payer: Medicare Other | Admitting: Hematology

## 2014-06-30 ENCOUNTER — Encounter: Payer: Self-pay | Admitting: Hematology

## 2014-06-30 ENCOUNTER — Other Ambulatory Visit (HOSPITAL_BASED_OUTPATIENT_CLINIC_OR_DEPARTMENT_OTHER): Payer: Medicare Other

## 2014-06-30 VITALS — BP 151/86 | HR 93 | Temp 97.0°F | Resp 18 | Ht 69.0 in | Wt 174.7 lb

## 2014-06-30 DIAGNOSIS — I82403 Acute embolism and thrombosis of unspecified deep veins of lower extremity, bilateral: Secondary | ICD-10-CM | POA: Diagnosis not present

## 2014-06-30 DIAGNOSIS — E876 Hypokalemia: Secondary | ICD-10-CM

## 2014-06-30 DIAGNOSIS — C252 Malignant neoplasm of tail of pancreas: Secondary | ICD-10-CM

## 2014-06-30 DIAGNOSIS — Z86718 Personal history of other venous thrombosis and embolism: Secondary | ICD-10-CM

## 2014-06-30 DIAGNOSIS — I82402 Acute embolism and thrombosis of unspecified deep veins of left lower extremity: Secondary | ICD-10-CM

## 2014-06-30 LAB — CBC & DIFF AND RETIC
BASO%: 0.3 % (ref 0.0–2.0)
Basophils Absolute: 0 10*3/uL (ref 0.0–0.1)
EOS%: 3.2 % (ref 0.0–7.0)
Eosinophils Absolute: 0.2 10*3/uL (ref 0.0–0.5)
HCT: 38.9 % (ref 34.8–46.6)
HGB: 12.4 g/dL (ref 11.6–15.9)
IMMATURE RETIC FRACT: 8.3 % (ref 1.60–10.00)
LYMPH%: 38.2 % (ref 14.0–49.7)
MCH: 29 pg (ref 25.1–34.0)
MCHC: 31.9 g/dL (ref 31.5–36.0)
MCV: 90.9 fL (ref 79.5–101.0)
MONO#: 0.4 10*3/uL (ref 0.1–0.9)
MONO%: 6.6 % (ref 0.0–14.0)
NEUT#: 3.2 10*3/uL (ref 1.5–6.5)
NEUT%: 51.7 % (ref 38.4–76.8)
Platelets: 521 10*3/uL — ABNORMAL HIGH (ref 145–400)
RBC: 4.28 10*6/uL (ref 3.70–5.45)
RDW: 17.4 % — ABNORMAL HIGH (ref 11.2–14.5)
RETIC %: 2.12 % — AB (ref 0.70–2.10)
RETIC CT ABS: 90.74 10*3/uL — AB (ref 33.70–90.70)
WBC: 6.2 10*3/uL (ref 3.9–10.3)
lymph#: 2.4 10*3/uL (ref 0.9–3.3)

## 2014-06-30 LAB — COMPREHENSIVE METABOLIC PANEL (CC13)
ALK PHOS: 107 U/L (ref 40–150)
ALT: 14 U/L (ref 0–55)
AST: 36 U/L — AB (ref 5–34)
Albumin: 2.6 g/dL — ABNORMAL LOW (ref 3.5–5.0)
Anion Gap: 11 mEq/L (ref 3–11)
BILIRUBIN TOTAL: 0.26 mg/dL (ref 0.20–1.20)
BUN: 4.6 mg/dL — AB (ref 7.0–26.0)
CHLORIDE: 107 meq/L (ref 98–109)
CO2: 24 meq/L (ref 22–29)
CREATININE: 0.8 mg/dL (ref 0.6–1.1)
Calcium: 8.8 mg/dL (ref 8.4–10.4)
EGFR: >90 mL/min/{1.73_m2} (ref >90)
Glucose: 141 mg/dl — ABNORMAL HIGH (ref 70–140)
Potassium: 3.1 mEq/L — ABNORMAL LOW (ref 3.5–5.1)
SODIUM: 142 meq/L (ref 136–145)
TOTAL PROTEIN: 7.8 g/dL (ref 6.4–8.3)

## 2014-06-30 MED ORDER — ENOXAPARIN SODIUM 120 MG/0.8ML ~~LOC~~ SOLN: 120.0000 mg | SUBCUTANEOUS | Status: DC

## 2014-06-30 MED ORDER — POTASSIUM CHLORIDE CRYS ER 20 MEQ PO TBCR: 20.0000 meq | EXTENDED_RELEASE_TABLET | Freq: Two times a day (BID) | ORAL | Status: DC

## 2014-06-30 MED ORDER — HYDROCODONE-ACETAMINOPHEN 10-325 MG PO TABS: 1.0000 | ORAL_TABLET | ORAL | Status: DC | PRN

## 2014-06-30 NOTE — Progress Notes (Signed)
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 CT chest was negative.    02/26/2014 Pathology Results FNA of pancreatic mass showed adenocarcinoma    03/22/2014 Initial Diagnosis Pancreatic adenocarcinoma   04/08/2014 Surgery robotic converted to hand-assisted laparoscopic distal pancreatectomy, splenectomy, partial colectomy, and partial removal of the left renal capsule for pancreatic adenocarcinoma. Surgical margins were negative. pT3N0   04/08/2014 - 05/04/2014 Hospital Admission admitted for pancreatic surgery, complicated with abdominal abcess, which was drained and treated with abx for pseudomonas infection, difficulty with eating and ambulatory, malnutrition, was discharged to rehab     04/08/2014 Pathology Results pT3N0, moderately differentiated invasive adenocarcinoma, spanning 4.5 cm, invading colonic submucosa. Lymphovascular invasion is identified, perineural invasion is identified. 10 lymph nodes were negative. Surgical margins were negative.   05/09/2014 - 05/21/2014 Hospital Admission Pt was admited for chest and abdominal pain, found to have left side pleural effusion. thoracentesis  done by IR with 582ml fluids removed, cytology (-) malignant cells    HISTORY OF PRESENTING ILLNESS:  [name] 69 y.o. 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 She had IVC filter placed back then and put on coumadin. She had second episode of right leg DVT when she was on coumadin in 2009, and continued coumadin until one year ago when she had trouble for monitoring her INR. She was then switched to Aspirin 81mg  dialy. 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 CT abdomen was ordered to further evaluate the DVT and abdominal pain on 12/22/2013, which showed thrombus in IVC and right common iliac vein below IVC. CT scan also showed [name] 2.8 x 2.5 cm mass within the tail of pancreas, this was confirmed by the MRI abdomen, with probably encasement of the splenic vessels. No significant abdominal adenopathy. CT of the chest was negative for distant metastasis. She was referred to see GI Dr.Hung and will have EGD/EUS for pancreatic mass biopsy in [name] week.   She has been having abdominal pain after eating in the pat few month, along with diarrhea and constipation, which has been chronic since her cholecystectomy in 199. Her appetite is fare, and weight is stable. She also has tremor since teenager, and got worse lately, along with gait instaedyness, was seen by neurologist Dr. Carles Collet. She was put on primidone which has help quite bit.   She had both knee surgery  and was using [name] crane until recently when she was diagnosed with DVT and she has been using [name] walker. She has been having some gait instability lately, she was referred by Dr. Carles Collet and MRI of brain showed mild chronic small  vessel ischemia disease and cerebral atrophy.  INTERIM HISTORY [name] returns for follow-up with her daughter. She was discharged from stiff to home about months ago. She has completed the physical therapy. She walks around with [name] walker. She followed with Dr. Barry Dienes had [name] left abdominal drainage tube removed 2 weeks ago. She initially had moderate to severe pain at the drain tube site, Which subsided and resolved after one week. She overall feels better, still has intermittent constipation, mild nausea and low appetite. No fever or chills.    MEDICAL HISTORY:  Past Medical History  Diagnosis Date  . Glaucoma   . Clotting disorder   . DVT (deep venous thrombosis)     LEFT  . GERD (gastroesophageal reflux disease)   . Hypercholesterolemia   . Peripheral neuropathy   . Essential tremor   . Diverticulosis   . Arthritis     knees and back  . Cancer     Pancreatic  . Hypertension   . Colon polyps   . UTI (lower urinary tract infection)   . Complication of anesthesia     slow to wak up   . Pulmonary embolism     SURGICAL HISTORY: Past Surgical History  Procedure Laterality Date  . Cholecystectomy    . Abdominal hysterectomy    . Knee surgery Bilateral   . [place] filter    . Back surgery    . Eus N/[name] 02/26/2014    Procedure: UPPER ENDOSCOPIC ULTRASOUND (EUS) LINEAR;  Surgeon: Beryle Beams, MD;  Location: [facility];  Service: Endoscopy;  Laterality: N/[name];  . Fine needle aspiration N/[name] 02/26/2014    Procedure: FINE NEEDLE ASPIRATION (FNA) LINEAR;  Surgeon: Beryle Beams, MD;  Location: [facility];  Service: Endoscopy;  Laterality: N/[name];  . Breast lumpectomy Right   . Spine surgery    . Xi robotic assisted laparoscopic distal pancreatectomy N/[name] 04/08/2014    Procedure: XI ROBOTIC ASSISTED CONVERTED LAPAROSCOPIC HAND ASSITED DISTAL PANCREATECTOMY/SPLENECTOMY WITH PARTIAL COLECTOMY;  Surgeon: [name], MD;  Location: [facility];  Service: General;  Laterality: N/[name];  . Colon  surgery    . Diagnostic laparoscopy      SOCIAL HISTORY: History   Social History  . Marital Status: Divorced    Spouse Name: N/[name]    Number of Children: 3  . Years of Education: N/[name]   Occupational History   She used to work for [employer], retired now    Social History Main Topics  . Smoking status: Current Every Day Smoker -- 0.50 packs/day, for 40 years     Types: Cigarettes  . Smokeless tobacco: Not on file  . Alcohol Use: 0.0 oz/week, social drinker     0 Not specified per week     Comment: occ- glass of wine [name] week   . Drug Use: No  . Sexual Activity: Not on file   Other Topics Concern  . Not on file   Social History Narrative   Divorced, 3 children   Right handed   Associates degree   3 per week    FAMILY HISTORY: Family History  Problem Relation Age of Onset  . Diabetes Father 67  . Diabetes Maternal Grandmother   . Hypertension Mother   .  Glaucoma Sister   . Cataracts Maternal Grandmother   . Cancer Sister 15    Breast    ALLERGIES:  is allergic to sulfa antibiotics.  MEDICATIONS:  Current Outpatient Prescriptions  Medication Sig Dispense Refill  . atorvastatin (LIPITOR) 40 MG tablet Take 1 tablet (40 mg total) by mouth daily. 90 tablet 0  . brimonidine-timolol (COMBIGAN) 0.2-0.5 % ophthalmic solution Place 1 drop into the left eye every 12 (twelve) hours.    Marland Kitchen diltiazem (CARTIA XT) 120 MG 24 hr capsule Take 1 capsule (120 mg total) by mouth daily. 90 capsule 0  . HYDROcodone-acetaminophen (NORCO) 10-325 MG per tablet Take 1 tablet by mouth every 4 (four) hours as needed for severe pain. 60 tablet 0  . latanoprost (XALATAN) 0.005 % ophthalmic solution Place 1 drop into both eyes at bedtime.    . mirtazapine (REMERON) 15 MG tablet Take 1 tablet (15 mg total) by mouth at bedtime. 30 tablet 1  . ondansetron (ZOFRAN-ODT) 4 MG disintegrating tablet   0  . pantoprazole (PROTONIX) 40 MG tablet Take 1 tablet (40 mg total) by mouth 2 (two) times  daily before [name] meal. 30 tablet 2  . primidone (MYSOLINE) 50 MG tablet Take 50 mg by mouth at bedtime. Patient takes at nite    . propranolol ER (INDERAL LA) 120 MG 24 hr capsule   2  . enoxaparin (LOVENOX) 120 MG/0.8ML injection Inject 0.8 mLs (120 mg total) into the skin daily. 30 Syringe 3   No current facility-administered medications for this visit.    REVIEW OF SYSTEMS:   Constitutional: Denies fevers, chills or abnormal night sweats, (+) fatigue  Eyes: Denies blurriness of vision, double vision or watery eyes Ears, nose, mouth, throat, and face: Denies mucositis or sore throat Respiratory: Denies cough, dyspnea or wheezes Cardiovascular: Denies palpitation, chest discomfort or lower extremity swelling Gastrointestinal:  Denies nausea, heartburn or change in bowel habits. (+) intermittent abdominal pain  Skin: Denies abnormal skin rashes Lymphatics: Denies new lymphadenopathy or easy bruising Neurological:Denies numbness, tingling or new weaknesses Behavioral/Psych: Mood is stable, no new changes  Extremities: Bilateral lower extremity edema and leg pain, left more than right. All other systems were reviewed with the patient and are negative.  PHYSICAL EXAMINATION: ECOG PERFORMANCE STATUS: 3 - Symptomatic, >50% confined to bed  Filed Vitals:   06/30/14 0859  BP: 151/86  Pulse: 93  Temp: 97 F (36.1 C)  Resp: 18   Filed Weights   06/30/14 0859  Weight: 174 lb 11.2 oz (79.243 kg)    GENERAL:alert, no distress and comfortable, sitting in wheelchair.  SKIN: skin color, texture, turgor are normal, no rashes. She has some bruises on her abdomen at the Lovenox injection site. She also has [name] 2 cm subcutaneous nodule on the right flank area, likely [name] hematoma. EYES: normal, conjunctiva are pink and non-injected, sclera clear OROPHARYNX:no exudate, no erythema and lips, buccal mucosa, and tongue normal  NECK: supple, thyroid normal size, non-tender, without nodularity LYMPH:  no  palpable lymphadenopathy in the cervical, axillary or inguinal LUNGS: clear to auscultation and percussion with normal breathing effort HEART: regular rate & rhythm and no murmurs and no lower extremity edema ABDOMEN:abdomen soft, non-tender and normal bowel sounds Musculoskeletal:no cyanosis of digits and no clubbing  PSYCH: alert & oriented x 3 with fluent speech NEURO: no focal motor/sensory deficits Ext: (+) Bilateral Ankle swelling  LABORATORY DATA:  I have reviewed the data as listed Lab Results  Component Value Date  WBC 6.2 06/30/2014   HGB 12.4 06/30/2014   HCT 38.9 06/30/2014   MCV 90.9 06/30/2014   PLT 521* 06/30/2014   CMP Latest Ref Rng 06/30/2014 06/09/2014 05/29/2014  Glucose 70 - 140 mg/dl 141(H) 115(H) 101(H)  BUN 7.0 - 26.0 mg/dL 4.6(L) 6 5(L)  Creatinine 0.6 - 1.1 mg/dL 0.8 0.98 0.75  Sodium 136 - 145 mEq/L 142 137 135  Potassium 3.5 - 5.1 mEq/L 3.1(L) 4.2 2.7(LL)  Chloride 96 - 112 mEq/L - 105 106  CO2 22 - 29 mEq/L 24 24 21   Calcium 8.4 - 10.4 mg/dL 8.8 9.1 8.6  Total Protein 6.4 - 8.3 g/dL 7.8 - 7.1  Total Bilirubin 0.20 - 1.20 mg/dL 0.26 - 0.7  Alkaline Phos 40 - 150 U/L 107 - 77  AST 5 - 34 U/L 36(H) - 38(H)  ALT 0 - 55 U/L 14 - 13     PATHOLOGY REPORT 04/08/2014 REASON FOR ADDENDUM, AMENDMENT OR CORRECTION: [id number]: AMENDMENT: Amendment issued to report additional gross information in part #2. ds 04/14/14 04:55:45 PM FINAL DIAGNOSIS Diagnosis 04/08/2014 1. Colon, segmental resection, transverse - PANCREATIC ADENOCARCINOMA INVADING COLONIC SUBMUCOSA. - LYMPHOVASCULAR INVASION IS IDENTIFIED. - THE COLONIC RESECTION MARGINS ARE NEGATIVE FOR ADENOCARCINOMA. 2. Pancreas, resection - INVASIVE ADENOCARCINOMA, MODERATELY DIFFERENTIATED, SPANNING 4.5 CM. - LYMPHOVASCULAR INVASION IS IDENTIFIED. - PERINEURAL INVASION IS IDENTIFIED. - ADENOCARCINOMA IS BROADLY PRESENT AT T 10 mg 1 negative. Surgical margins were negative. HE CLINICAL COLONIC MARGIN  OF SPECIMEN #2. - ADENOCARCINOMA IS ADHERENT TO THE RENAL CAPSULE, BUT DOES NOT INVOLVE RENAL PARENCHYMA. - ADENOCARCINOMA IS ADHERENT TO THE SPLENIC CAPSULE, BUT DOES NOT INVOLVE SPLENIC PARENCHYMA. - THE PANCREATIC PARENCHYMAL RESECTION MARGIN IS NEGATIVE FOR ADENOCARCINOMA. - THERE IS NO EVIDENCE OF CARCINOMA IN 10 OF 10 LYMPH NODES (0/10). - SEE ONCOLOGY TABLE BELOW. Microscopic Comment 2. PANCREAS (EXOCRINE): Procedure: Distal pancreatectomy with adherent renal capsule and portion of spleen and transverse colon resection. Tumor Site: Distal pancreatic tail. Tumor Size: 4.5 cm (gross measurement). Histologic Type: Adenocarcinoma. Histologic Grade (ductal carcinoma only): G2: Moderately differentiated Microscopic Tumor Extension: Adenocarcinoma involves colonic submucosa, renal capsule, and splenic capsule. Margins: The pancreatic parenchymal margin is negative for adenocarcinoma. Distance of invasive carcinoma from closest margin: 5.2 cm to the pancreatic parenchymal margin, see comment. Carcinoma in situ / high-grade dysplasia: Not identified. Treatment Effect: N/[name]. 1 of 3 Amended copy Amended FINAL for [name], [name] ([id number]) Microscopic Comment(continued) Lymph-Vascular Invasion: Present, diffuse Perineural Invasion: Present, diffuse Lymph nodes: number examined 10; number positive: 0. Pathologic Staging: pT3, pN0. Additional Pathologic Findings: No significant findings. Ancillary Studies: N/[name]. Comment(s): There is [name] primary 4.5 cm moderately differentiated pancreatic adenocarcinoma present in the tail of the pancreas. There is direct extension of this tumor to the submucosa of the transverse colon (specimen #1). In addition, adenocarcinoma extends to and involves the renal capsule and splenic capsule. Despite four lymph nodes being negative for parenchymal metastases, one of the four lymph nodes does show perinodal deposits of adenocarcinoma. The adenocarcinoma present  in the submucosa of the transverse colon, renal capsule, splenic capsule, and perinodal soft tissue is interpreted to represent direct extension of the primary tumor, rather than metastatic sites and thus, the tumor is staged as above.  Comment There are malignant cells with prominent nucleili and intracytoplasmic vacuoles, arranged in acinar pattern. The overall morphologic findings favor [name] diagnosis of well differentiated adenocarcinoma.  RADIOGRAPHIC STUDIES: I have personally reviewed the radiological images as listed and agreed with the findings in the report.  Ct Abdomen Pelvis Wo Contrast 04/29/2014    IMPRESSION: 1. Postoperative changes of distal pancreatectomy with stable pseudocyst or seroma adjacent to the surgical margin. 2. Resolved left upper quadrant abscess with 2 percutaneous drains in place. 3. Small pelvic free fluid. 4. Moderate left pleural effusion with likely compressive atelectasis in the left lower lobe. Difficult to definitively exclude pneumonia.   Electronically Signed   By: Lorin Picket M.D.   On: 04/29/2014 15:25   CT abdomen w contrast 06/15/2014 IMPRESSION: 1. Unchanged positioning of percutaneous drainage catheter within the distal pancreatic resection bed without evidence of residual fluid collection. The patient subsequent underwent fluoroscopic guided percutaneous drainage catheter injection. 2. Stable sequela of distal pancreatectomy, splenectomy and partial colonic resection without evidence of new definable/drainable fluid collection or additional complication. 3. Unchanged small left-sided effusion with associated partial atelectasis of the left lower lobe. The previously questioned increased density within the left pleural fluid is less conspicuous on the present examination. Correlation with cytologic analysis from prior ultrasound-guided thoracentesis is recommended.   ASSESSMENT & PLAN:  69 year old Caucasian female, with past medical  history of recurrent bilateral lower extremity DVTs, [facility] left lower extremity DVT and thrombus in the IVC and the right, iliac vein, and chronic DVT in the right popliteal vein. She was also found to have [name] pancreatic tail mass on the CT scan.  1. Pancreatic adenocarcinoma, pT3 N0 M0, stage IIA -I reviewed her surgical pathology result with her and her daughter.  her surgical margin was negative.  -I discussed the role of adjuvant chemotherapy after her surgery to reduce the risk of cancer recurrence after surgery. We usually recommend single agent gemcitabine as adjuvant therapy, however she had multiple complications after surgery, likely need long time to recover. - her recovery has been quite slow after surgery. She is 3 month out of surgery now, no benefit for adjuvant chemotherapy. -We discussed surveillance plan. I will get [name] repeated CT scan in 3 month.  -she is going to follow up with Dr. Barry Dienes   2. Recurrent DVT  -I reviewed her lab results of prothrombin gene mutation, factor V Leyden mutation, and antithrombin III, which were all negative. -Giving her recurrent unprovoked DVT, I recommend anticoagulation indefinitely. -continue lovenox, I change in her 100 mg twice daily to 120 mg once daily, based on her current weight. Her renal function is normal.  3. Hypokalemia -K 3.1 today, I called in KCL 61meq daily for 2 weeks   4. She will continue to follow-up with her primary care physician and neurologist Dr. Carles Collet for her tremor, peripheral neuropathy and gait disturbance.   Plan: -Return to clinic in 3 months with lab and CT scans    All questions were answered. The patient knows to call the clinic with any problems, questions or concerns. I spent 20 minutes counseling the patient face to face. The total time spent in the appointment was 25 minutes and more than 50% was on counseling.     Truitt Merle, MD 06/30/2014   7:14 PM

## 2014-06-30 NOTE — Telephone Encounter (Signed)
Gave and printed appt sched and avs for pt for Sept °

## 2014-07-21 ENCOUNTER — Telehealth: Payer: Self-pay | Admitting: Geriatric Medicine

## 2014-07-21 NOTE — Telephone Encounter (Signed)
Patient had a mammogram 2 years ago, but cannot remember where she had it done. She will find out and call us back. Then we can put orders in for her to go have a mammogram.

## 2014-07-21 NOTE — Telephone Encounter (Addendum)
Pt Dec 2013 At [facility] [address]    Please put in orders for pt to have a Mammorgram At the same place

## 2014-07-22 ENCOUNTER — Other Ambulatory Visit: Payer: Self-pay | Admitting: Geriatric Medicine

## 2014-07-22 ENCOUNTER — Telehealth: Payer: Self-pay | Admitting: Family

## 2014-07-22 DIAGNOSIS — Z1231 Encounter for screening mammogram for malignant neoplasm of breast: Secondary | ICD-10-CM

## 2014-07-22 MED ORDER — PANTOPRAZOLE SODIUM 40 MG PO TBEC: 40.0000 mg | DELAYED_RELEASE_TABLET | Freq: Two times a day (BID) | ORAL | Status: DC

## 2014-07-22 NOTE — Telephone Encounter (Signed)
Patient is requesting refill on pantoprazole to be sent to [facility] at [address]

## 2014-07-22 NOTE — Telephone Encounter (Signed)
Pt aware.

## 2014-08-03 ENCOUNTER — Other Ambulatory Visit: Payer: Self-pay

## 2014-08-03 ENCOUNTER — Other Ambulatory Visit: Payer: Self-pay | Admitting: General Surgery

## 2014-08-03 DIAGNOSIS — Z139 Encounter for screening, unspecified: Secondary | ICD-10-CM

## 2014-08-03 DIAGNOSIS — Z1231 Encounter for screening mammogram for malignant neoplasm of breast: Secondary | ICD-10-CM

## 2014-08-03 DIAGNOSIS — Z1239 Encounter for other screening for malignant neoplasm of breast: Secondary | ICD-10-CM

## 2014-08-03 NOTE — Addendum Note (Signed)
Addended by: [name] on: 08/03/2014 01:11 PM   Modules accepted: Orders

## 2014-08-11 LAB — HM MAMMOGRAPHY: HM Mammogram: NEGATIVE

## 2014-08-13 ENCOUNTER — Encounter: Payer: Self-pay | Admitting: Family

## 2014-08-17 ENCOUNTER — Ambulatory Visit: Payer: Medicare Other | Attending: General Surgery | Admitting: Physical Therapy

## 2014-08-17 DIAGNOSIS — R29898 Other symptoms and signs involving the musculoskeletal system: Secondary | ICD-10-CM | POA: Insufficient documentation

## 2014-08-17 DIAGNOSIS — R5381 Other malaise: Secondary | ICD-10-CM | POA: Diagnosis not present

## 2014-08-17 NOTE — Therapy (Signed)
[place], [place], [address] Phone: [phone]   Fax:  [phone]  Physical Therapy Evaluation  Patient Details  Name: [name] MRN: [id number] Date of Birth: 1945/06/22 Referring Provider:  [name], MD  Encounter Date: 08/17/2014      PT End of Session - 08/17/14 1705    Visit Number 1   Number of Visits 17   Date for PT Re-Evaluation 10/25/14   PT Start Time 1600   PT Stop Time 1645   PT Time Calculation (min) 45 min   Activity Tolerance Patient tolerated treatment well   Behavior During Therapy [facility] for tasks assessed/performed      Past Medical History  Diagnosis Date  . Glaucoma   . Clotting disorder   . DVT (deep venous thrombosis)     LEFT  . GERD (gastroesophageal reflux disease)   . Hypercholesterolemia   . Peripheral neuropathy   . Essential tremor   . Diverticulosis   . Arthritis     knees and back  . Cancer     Pancreatic  . Hypertension   . Colon polyps   . UTI (lower urinary tract infection)   . Complication of anesthesia     slow to wak up   . Pulmonary embolism     Past Surgical History  Procedure Laterality Date  . Cholecystectomy    . Abdominal hysterectomy    . Knee surgery Bilateral   . Greenfield filter    . Back surgery    . Eus N/A 02/26/2014    Procedure: UPPER ENDOSCOPIC ULTRASOUND (EUS) LINEAR;  Surgeon: Beryle Beams, MD;  Location: [facility];  Service: Endoscopy;  Laterality: N/A;  . Fine needle aspiration N/A 02/26/2014    Procedure: FINE NEEDLE ASPIRATION (FNA) LINEAR;  Surgeon: Beryle Beams, MD;  Location: [facility];  Service: Endoscopy;  Laterality: N/A;  . Breast lumpectomy Right   . Spine surgery    . Xi robotic assisted laparoscopic distal pancreatectomy N/A 04/08/2014    Procedure: XI ROBOTIC ASSISTED CONVERTED LAPAROSCOPIC HAND ASSITED DISTAL PANCREATECTOMY/SPLENECTOMY WITH PARTIAL COLECTOMY;  Surgeon: [name],  MD;  Location: [facility];  Service: General;  Laterality: N/A;  . Colon surgery    . Diagnostic laparoscopy      There were no vitals filed for this visit.  Visit Diagnosis:  Physical deconditioning  Weakness of both legs      Subjective Assessment - 08/17/14 1601    Subjective "I had my surgery"  she has part of colon part of kidney, part of the part of pancreas and and all the spleen.     Patient is accompained by: Family member  daughter   Pertinent History currently has a foley catheter, but may be having a trial to remove it or to have a suprapubic cathertic. still has numvess in feet h/o DVT with a filter in place, h/o back surgery that was interrupted,hs of bilateral knee arrhoscopies.  Has good days and bad days    Limitations Sitting  has some pain from foley cather bulb    How long can you stand comfortably? 5 minutes    How long can you walk comfortably? " I can walk when I have to walk, but it tires me out "   Patient Stated Goals to get legs and arm stronger    Currently in Pain? No/denies  occasionally has pain where the drains were.  [facility] PT Assessment - 08/17/14 0001    Assessment   Medical Diagnosis Pancreatic tumor   Precautions   Precautions Other (comment)   Precaution Comments cancer precautions   Restrictions   Weight Bearing Restrictions No   Balance Screen   Has the patient fallen in the past 6 months No   Has the patient had a decrease in activity level because of a fear of falling?  No   Is the patient reluctant to leave their home because of a fear of falling?  No   Home Environment   Living Environment Private residence   Living Arrangements Children   Type of [place] to enter   Entrance Stairs-Number of Steps 1   [place] One level   Prior Function   Level of Independence Needs assistance with ADLs;Needs assistance with homemaking;Needs assistance with gait;Needs assistance with transfers;Other  (comment)   Comments needs to have help from getting from a low surface, read, watch TV, crossword puzzles, eat    Cognition   Overall Cognitive Status Within Functional Limits for tasks assessed   Observation/Other Assessments   Observations --   Sensation   Light Touch Impaired by gross assessment  not tested, but by report, sounds fairly limited   Additional Comments Has to have feet off the bed because they're otherwise too uncomfortable to sleep; also gets feet and leg cramps   Functional Tests   Functional tests Sit to Stand   Sit to Stand   Comments 5  top of mat 22" high, pushed up with arms to walker    AROM   Overall AROM  Other (comment)   Overall AROM Comments Both shoulders WFL   Strength   Overall Strength Deficits   Overall Strength Comments hip flexors 3-/5 bilat.; quads 4+/5 with right knee pain; ankle dorsiflexors on right 4/5 but limited by pain;  hamstrings on right show good resistance in sitting; left limited by pain and weaker   Right Hand Grip (lbs) 26  26   Left Hand Grip (lbs) 33  35/34/30   Bed Mobility   Bed Mobility Not assessed   Ambulation/Gait   Ambulation/Gait Yes   Ambulation/Gait Assistance 6: Modified independent (Device/Increase time)   Ambulation Distance (Feet) --   Assistive device 4-wheeled walker   Gait Pattern Step-through pattern  with rolling walker   Ambulation Surface Level;Indoor;Outdoor;Paved   Gait velocity slow, but able to change speeds    Stairs Assistance 5: Supervision   Stairs Assistance Details (indicate cue type and reason) verbal,cues    Stair Management Technique Backwards   Number of Stairs 1   Height of Stairs 4   Gait Comments Has used the walker since the last DVT in November   Timed Up and Go Test   Normal TUG (seconds) 21.55   Cognitive TUG (seconds) 25.4                           PT Education - 08/17/14 1719    Education provided Yes   Education Details begin doing repeated sit to  stands and seated hip to front to hip at home    Person(s) Educated Patient;Child(ren)   Methods Explanation;Demonstration   Comprehension Verbalized understanding;Returned demonstration           Short Term Clinic Goals - 08/17/14 1713    CC Short Term Goal  #1   Title Pt. will be independent in an  initial home exercise program.   Time 4   Status New   CC Short Term Goal  #2   Title pt will be able to perform 8 sit to stands from 22" mat in 30 seconds to demonstrate increased fuctional capacity and strentgth   Time 4   Period Weeks   Status New             Long Term Clinic Goals - 2014-08-28 1715    CC Long Term Goal  #1   Title Pt will increase average grip strength by 5  pounds in each arm.    Baseline left 33, right 26 on 08/28/2014   Time 8   Period Weeks   Status New   CC Long Term Goal  #2   Title Patient will be independent in a basic advanced home exercise program   Time 8   Period Weeks   Status New   CC Long Term Goal  #3   Title pt will decrease normal TUG to 19 seconds to demonstarte increased functional balance to decrease fall risk    Baseline 21.55 on 2014/08/28   Time 8   Period Weeks   Status New   CC Long Term Goal  #4   Title pt will report 50% decrease in fatigue level so that she can participate more in family life at home    Time 8   Period [place] - 2014-08-28 1706    Clinical Impression Statement Pt returns to therapy for continued strengthening since she has recovred from her surgery on 04/08/2014.  She appears to be improved from her status at that time and is ready to progress further.  He family is supportive and pt is movitivated to learn a home program to follow through at home and do more challeing activities here in the clinic.   Pt will benefit from skilled therapeutic intervention in order to improve on the following deficits Abnormal gait;Decreased endurance;Impaired sensation;Decreased  strength;Difficulty walking;Decreased activity tolerance   Rehab Potential Good   Clinical Impairments Affecting Rehab Potential abdominal surgery, back pathology, history of knee arthroscopies, decreased endurance    PT Frequency 2x / week   PT Duration 8 weeks   PT Treatment/Interventions ADLs/Self Care Home Management;Functional mobility training;Stair training;Gait training;Therapeutic activities;Therapeutic exercise;Balance training;Neuromuscular re-education;Patient/family education   PT Next Visit Plan Home exercise for core, UE and LE, (beginning core on VHI program??)  Do 6  minute walk test for baseline. begin standing balance weight shift with decreaseing UE support    Consulted and Agree with Plan of Care Patient;Family member/caregiver   Family Member Consulted daughter           G-Codes - 2014/08/28 1719    Functional Assessment Tool Used clinical judgement   Functional Limitation Mobility: Walking and moving around   Mobility: Walking and Moving Around Current Status [phone]) At least 60 percent but less than 80 percent impaired, limited or restricted   Mobility: Walking and Moving Around Goal Status [phone]) At least 20 percent but less than 40 percent impaired, limited or restricted       Problem List Patient Active Problem List   Diagnosis Date Noted  . Pleural effusion on left   . Anemia of chronic disease 05/15/2014  . Essential tremor 05/05/2014  . Urinary retention 05/05/2014  . Peripheral neuropathy 05/05/2014  . Glaucoma 05/05/2014  . Intra-abdominal abscess   . Malnutrition  of moderate degree 04/28/2014  . 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 DVT (deep venous thrombosis) 01/11/2014  . Diverticular disease 12/22/2013  . History of DVT (deep vein thrombosis) 03/01/2011  . Hx pulmonary embolism 03/01/2011  . HTN (hypertension) 03/01/2011  . Hypercholesterolemia 03/01/2011  . Reflux  03/01/2011   Donato Heinz. Owens Shark, PT   08/17/2014, 5:21 PM  [place], [place], [address] Phone: [phone]   Fax:  [phone]

## 2014-08-18 ENCOUNTER — Telehealth: Payer: Self-pay | Admitting: Family

## 2014-08-18 NOTE — Telephone Encounter (Signed)
Rec'd from [facility] forward 4 pages to [facility]

## 2014-08-19 ENCOUNTER — Encounter: Payer: Self-pay | Admitting: Physical Therapy

## 2014-08-19 ENCOUNTER — Ambulatory Visit: Payer: Medicare Other | Admitting: Physical Therapy

## 2014-08-19 DIAGNOSIS — R5381 Other malaise: Secondary | ICD-10-CM | POA: Diagnosis not present

## 2014-08-19 DIAGNOSIS — R29898 Other symptoms and signs involving the musculoskeletal system: Secondary | ICD-10-CM

## 2014-08-19 NOTE — Therapy (Signed)
[place], [place], [address] Phone: [phone]   Fax:  [phone]  Physical Therapy Treatment  Patient Details  Name: [name] MRN: [id number] Date of Birth: 1945-11-09 Referring Provider:  [name], MD  Encounter Date: 08/19/2014      PT End of Session - 08/19/14 1635    Visit Number 2   Number of Visits 17   Date for PT Re-Evaluation 10/25/14   PT Start Time 1552   PT Stop Time 1430   PT Time Calculation (min) 45 min   Activity Tolerance Patient limited by fatigue;Patient limited by pain   Behavior During Therapy Agitated  Unhappy due to back pain      Past Medical History  Diagnosis Date  . Glaucoma   . Clotting disorder   . DVT (deep venous thrombosis)     LEFT  . GERD (gastroesophageal reflux disease)   . Hypercholesterolemia   . Peripheral neuropathy   . Essential tremor   . Diverticulosis   . Arthritis     knees and back  . Cancer     Pancreatic  . Hypertension   . Colon polyps   . UTI (lower urinary tract infection)   . Complication of anesthesia     slow to wak up   . Pulmonary embolism     Past Surgical History  Procedure Laterality Date  . Cholecystectomy    . Abdominal hysterectomy    . Knee surgery Bilateral   . Greenfield filter    . Back surgery    . Eus N/A 02/26/2014    Procedure: UPPER ENDOSCOPIC ULTRASOUND (EUS) LINEAR;  Surgeon: Beryle Beams, MD;  Location: [facility];  Service: Endoscopy;  Laterality: N/A;  . Fine needle aspiration N/A 02/26/2014    Procedure: FINE NEEDLE ASPIRATION (FNA) LINEAR;  Surgeon: Beryle Beams, MD;  Location: [facility];  Service: Endoscopy;  Laterality: N/A;  . Breast lumpectomy Right   . Spine surgery    . Xi robotic assisted laparoscopic distal pancreatectomy N/A 04/08/2014    Procedure: XI ROBOTIC ASSISTED CONVERTED LAPAROSCOPIC HAND ASSITED DISTAL PANCREATECTOMY/SPLENECTOMY WITH PARTIAL COLECTOMY;   Surgeon: [name], MD;  Location: [facility];  Service: General;  Laterality: N/A;  . Colon surgery    . Diagnostic laparoscopy      There were no vitals filed for this visit.  Visit Diagnosis:  Physical deconditioning  Weakness of both legs      Subjective Assessment - 08/19/14 1348    Subjective My back is sore today but otherwise I'm ok.  Just tired.   Patient is accompained by: Family member   Pertinent History currently has a foley catheter, but may be having a trial to remove it or to have a suprapubic cathertic. still has numvess in feet h/o DVT with a filter in place, h/o back surgery that was interrupted,hs of bilateral knee arrhoscopies.  Has good days and bad days    Limitations Sitting   Currently in Pain? Yes   Pain Score 7    Pain Location Back   Pain Orientation Lower   Pain Descriptors / Indicators Aching   Pain Type Chronic pain   Pain Onset More than a month ago   Pain Frequency Constant   Aggravating Factors  Walking   Pain Relieving Factors Heat on back  [place] Adult PT Treatment/Exercise - 08/19/14 0001    Ambulation/Gait   Ambulation/Gait --  Unable to do any gait or walk test today due to back pain   Knee/Hip Exercises: Aerobic   Stationary Bike Pt declined due to back pain   Knee/Hip Exercises: Standing   Other Standing Knee Exercises Sit to stand without UEs x5 with PT supervision   Knee/Hip Exercises: Seated   Long Arc Quad Weight 2 lbs.  x10 BLE with 3 sec holds   Other Seated Knee/Hip Exercises March x20 BLE; hip adduction pillow squeeze x20 with 3 sec holds   Other Seated Knee/Hip Exercises Seated hip abduction with red theraband around knees x20; seated toe raises x 1 min; heel raises seated x 1 min   Knee/Hip Exercises: Supine   Heel Slides AROM;Both;1 set  x10 BLE   Bridges Limitations Bridge x10 with limited ROM   Straight Leg Raises AROM;Both;1 set  x10 with PT min assist   Other Supine Knee/Hip  Exercises Marching in hooklying    Modalities   Modalities Moist Heat   Moist Heat Therapy   Number Minutes Moist Heat 15 Minutes   Moist Heat Location Lumbar Spine  low back during some exercises to reduce back pain                PT Education - 08/19/14 1634    Education provided Yes   Education Details Leg strengthening   Person(s) Educated Patient;Child(ren)   Methods Explanation;Demonstration;Handout   Comprehension Verbalized understanding;Returned demonstration           Short Term Clinic Goals - 08/17/14 1713    CC Short Term Goal  #1   Title Pt. will be independent in an initial home exercise program.   Time 4   Status New   CC Short Term Goal  #2   Title pt will be able to perform 8 sit to stands from 22" mat in 30 seconds to demonstrate increased fuctional capacity and strentgth   Time 4   Period Weeks   Status New             Long Term Clinic Goals - 08/17/14 1715    CC Long Term Goal  #1   Title Pt will increase average grip strength by 5  pounds in each arm.    Baseline left 33, right 26 on 08/17/2014   Time 8   Period Weeks   Status New   CC Long Term Goal  #2   Title Patient will be independent in a basic advanced home exercise program   Time 8   Period Weeks   Status New   CC Long Term Goal  #3   Title pt will decrease normal TUG to 19 seconds to demonstarte increased functional balance to decrease fall risk    Baseline 21.55 on 08/17/2014   Time 8   Period Weeks   Status New   CC Long Term Goal  #4   Title pt will report 50% decrease in fatigue level so that she can participate more in family life at home    Time [place] - 08/19/14 1645    Pt will benefit from skilled therapeutic intervention in order to improve on the following deficits Abnormal gait;Decreased endurance;Impaired sensation;Decreased strength;Difficulty walking;Decreased activity tolerance   Rehab Potential Good    Clinical Impairments Affecting Rehab Potential abdominal surgery,  back pathology, history of knee arthroscopies, decreased endurance    PT Frequency 2x / week   PT Duration 8 weeks   PT Treatment/Interventions ADLs/Self Care Home Management;Functional mobility training;Stair training;Gait training;Therapeutic activities;Therapeutic exercise;Balance training;Neuromuscular re-education;Patient/family education;Manual techniques;Moist Heat   PT Next Visit Plan Continue leg strengthening and do 3 minute walk test if she tolerates (refused today due to back pain)   Consulted and Agree with Plan of Care Patient;Family member/caregiver   Family Member Consulted daughter         Problem List Patient Active Problem List   Diagnosis Date Noted  . Pleural effusion on left   . Anemia of chronic disease 05/15/2014  . Essential tremor 05/05/2014  . Urinary retention 05/05/2014  . Peripheral neuropathy 05/05/2014  . Glaucoma 05/05/2014  . Intra-abdominal abscess   . Malnutrition of moderate degree 04/28/2014  . 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 DVT (deep venous thrombosis) 01/11/2014  . Diverticular disease 12/22/2013  . History of DVT (deep vein thrombosis) 03/01/2011  . Hx pulmonary embolism 03/01/2011  . HTN (hypertension) 03/01/2011  . Hypercholesterolemia 03/01/2011  . Reflux 03/01/2011    Annia Friendly, PT 08/19/2014, 4:47 PM  [place], [place], [address] Phone: [phone]   Fax:  [phone]

## 2014-08-19 NOTE — Patient Instructions (Signed)
HIP: Flexion / KNEE: Extension, Straight Leg Raise   Raise leg, keeping knee straight. Perform slowly. _10__ reps per set, _2__ sets per day, _7__ days per week   Copyright  VHI. All rights reserved.  (Clinic) Knee: Extension - Sitting   Sit with knee over edge of bench. Lift leg, straighten knee. You are doing this without the pulley (unlike the picture) Repeat __10__ times per set. Do __1__ sets per session. Do this twice a day.  Copyright  VHI. All rights reserved.  Bridge   Lie back, legs bent. Inhale, pressing hips up. Keeping ribs in, lengthen lower back. Exhale, rolling down along spine from top. Repeat __10__ times. Do __2__ sessions per day.  Copyright  VHI. All rights reserved.  Body Positioning   Place your hands on your knees.  Stand up from a chair without using your hands. Repeat __10__ times. Do __2__ sessions per day.  http://gt2.exer.us/462   Copyright  VHI. All rights reserved.   The goal the 10 repetitions twice a day but it fine to start with 5 reps twice a day.  Progress yourself as you're able.

## 2014-08-24 ENCOUNTER — Ambulatory Visit: Payer: Medicare Other | Admitting: Physical Therapy

## 2014-08-25 ENCOUNTER — Other Ambulatory Visit: Payer: Self-pay | Admitting: Physician Assistant

## 2014-08-25 ENCOUNTER — Other Ambulatory Visit: Payer: Self-pay | Admitting: Neurology

## 2014-08-25 NOTE — Telephone Encounter (Signed)
Primidone refill requested. Per last office note- patient to remain on medication. Refill approved and sent to patient's pharmacy with note that she needs appt for any further refills.

## 2014-09-01 ENCOUNTER — Ambulatory Visit: Payer: Medicare Other | Attending: General Surgery

## 2014-09-01 DIAGNOSIS — R29898 Other symptoms and signs involving the musculoskeletal system: Secondary | ICD-10-CM | POA: Insufficient documentation

## 2014-09-01 DIAGNOSIS — R5381 Other malaise: Secondary | ICD-10-CM | POA: Insufficient documentation

## 2014-09-01 NOTE — Therapy (Signed)
[place], [place], [address] Phone: [phone]   Fax:  [phone]  Physical Therapy Treatment  Patient Details  Name: [name] MRN: [id number] Date of Birth: September 18, 1945 Referring Provider:  [name], MD  Encounter Date: 09/01/2014      PT End of Session - 09/01/14 1201    Visit Number 3   Number of Visits 17   Date for PT Re-Evaluation 10/25/14   PT Start Time 1110   PT Stop Time 1153   PT Time Calculation (min) 43 min   Activity Tolerance Patient limited by fatigue   Behavior During Therapy [facility] for tasks assessed/performed      Past Medical History  Diagnosis Date  . Glaucoma   . Clotting disorder   . DVT (deep venous thrombosis)     LEFT  . GERD (gastroesophageal reflux disease)   . Hypercholesterolemia   . Peripheral neuropathy   . Essential tremor   . Diverticulosis   . Arthritis     knees and back  . Cancer     Pancreatic  . Hypertension   . Colon polyps   . UTI (lower urinary tract infection)   . Complication of anesthesia     slow to wak up   . Pulmonary embolism     Past Surgical History  Procedure Laterality Date  . Cholecystectomy    . Abdominal hysterectomy    . Knee surgery Bilateral   . Greenfield filter    . Back surgery    . Eus N/A 02/26/2014    Procedure: UPPER ENDOSCOPIC ULTRASOUND (EUS) LINEAR;  Surgeon: Beryle Beams, MD;  Location: [facility];  Service: Endoscopy;  Laterality: N/A;  . Fine needle aspiration N/A 02/26/2014    Procedure: FINE NEEDLE ASPIRATION (FNA) LINEAR;  Surgeon: Beryle Beams, MD;  Location: [facility];  Service: Endoscopy;  Laterality: N/A;  . Breast lumpectomy Right   . Spine surgery    . Xi robotic assisted laparoscopic distal pancreatectomy N/A 04/08/2014    Procedure: XI ROBOTIC ASSISTED CONVERTED LAPAROSCOPIC HAND ASSITED DISTAL PANCREATECTOMY/SPLENECTOMY WITH PARTIAL COLECTOMY;  Surgeon: [name], MD;   Location: [facility];  Service: General;  Laterality: N/A;  . Colon surgery    . Diagnostic laparoscopy      There were no vitals filed for this visit.  Visit Diagnosis:  Physical deconditioning  Weakness of both legs      Subjective Assessment - 09/01/14 1115    Subjective I feel better today, I'll try the bike. On an antibiotic now for a UTI but dont know the name. My back isn't hurting today, just feels sore and tender. The doctor took my foley out yesterday to see if I can void independently but so far I haven't been able to. If not I'll be getting the suprapubic catheter.    Currently in Pain? No/denies            [facility] PT Assessment - 09/01/14 0001    6 Minute Walk- Baseline   6 Minute Walk- Baseline yes   Modified Borg Scale for Dyspnea 4- somewhat severe   Perceived Rate of Exertion (Borg) 6-   6 Minute walk- Post Test   6 Minute Walk Post Test yes   6 minute walk test results    Aerobic Endurance Distance Walked 390   Endurance additional comments pt tolerated 4 minutes with 1 sitting rest break  [facility] Adult PT Treatment/Exercise - 09/01/14 0001    Knee/Hip Exercises: Aerobic   Stationary Bike Recumbent bike no resistance 4 minutes   Knee/Hip Exercises: Standing   Heel Raises Both;10 reps  2 HHA   Forward Step Up Both;2 sets;5 reps;Hand Hold: 2;Step Height: 4"  Stood at back of recumbent bike   Other Standing Knee Exercises Bil Hip abduction 10 reps each   Knee/Hip Exercises: Seated   Long Arc Quad Strengthening;Both;15 reps   Long Arc Quad Weight 2 lbs.   Other Seated Knee/Hip Exercises Marching 2 sets of 10 reps 2 lbs each ankle; ball squeeze (small orange ball) hip adduction 20 reps   Other Seated Knee/Hip Exercises Seated hip abduction with green theraband for 20 reps                   Short Term Clinic Goals - 08/17/14 1713    CC Short Term Goal  #1   Title Pt. will be independent in an initial home exercise  program.   Time 4   Status New   CC Short Term Goal  #2   Title pt will be able to perform 8 sit to stands from 22" mat in 30 seconds to demonstrate increased fuctional capacity and strentgth   Time 4   Period Weeks   Status New             Long Term Clinic Goals - 08/17/14 1715    CC Long Term Goal  #1   Title Pt will increase average grip strength by 5  pounds in each arm.    Baseline left 33, right 26 on 08/17/2014   Time 8   Period Weeks   Status New   CC Long Term Goal  #2   Title Patient will be independent in a basic advanced home exercise program   Time 8   Period Weeks   Status New   CC Long Term Goal  #3   Title pt will decrease normal TUG to 19 seconds to demonstarte increased functional balance to decrease fall risk    Baseline 21.55 on 08/17/2014   Time 8   Period Weeks   Status New   CC Long Term Goal  #4   Title pt will report 50% decrease in fatigue level so that she can participate more in family life at home    Time 8   Period [place] - 09/01/14 1202    Clinical Impression Statement Patient did very well today with only complaint being fatigue after exercises so required frequent sitting rest breaks including during 4 mile walk test. She was very happy to not be in pain and thinks it might've helped that she slept well last night without her catheter. She hopes to not need it but has not been able to void independently so far. Has appt with doctor this afternoon regarding this.    Pt will benefit from skilled therapeutic intervention in order to improve on the following deficits Abnormal gait;Decreased endurance;Impaired sensation;Decreased strength;Difficulty walking;Decreased activity tolerance   Rehab Potential Good   Clinical Impairments Affecting Rehab Potential abdominal surgery, back pathology, history of knee arthroscopies, decreased endurance    PT Frequency 2x / week   PT Duration 8 weeks   PT  Treatment/Interventions ADLs/Self Care Home Management;Functional mobility training;Stair training;Gait training;Therapeutic activities;Therapeutic exercise;Balance training;Neuromuscular re-education;Patient/family education;Manual techniques;Moist Heat   PT Next Visit Plan  Continue leg strengthening. Retest 3 minute walk test tomorrow without stopping time at rest break.    Consulted and Agree with Plan of Care Patient        Problem List Patient Active Problem List   Diagnosis Date Noted  . Pleural effusion on left   . Anemia of chronic disease 05/15/2014  . Essential tremor 05/05/2014  . Urinary retention 05/05/2014  . Peripheral neuropathy 05/05/2014  . Glaucoma 05/05/2014  . Intra-abdominal abscess   . Malnutrition of moderate degree 04/28/2014  . 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 DVT (deep venous thrombosis) 01/11/2014  . Diverticular disease 12/22/2013  . History of DVT (deep vein thrombosis) 03/01/2011  . Hx pulmonary embolism 03/01/2011  . HTN (hypertension) 03/01/2011  . Hypercholesterolemia 03/01/2011  . Reflux 03/01/2011    Otelia Limes, PTA 09/01/2014, 12:07 PM  [place], [place], [address] Phone: [phone]   Fax:  [phone]

## 2014-09-02 ENCOUNTER — Ambulatory Visit: Payer: Medicare Other

## 2014-09-02 DIAGNOSIS — R29898 Other symptoms and signs involving the musculoskeletal system: Secondary | ICD-10-CM

## 2014-09-02 DIAGNOSIS — R5381 Other malaise: Secondary | ICD-10-CM

## 2014-09-02 NOTE — Therapy (Signed)
[place], [place], [address] Phone: [phone]   Fax:  [phone]  Physical Therapy Treatment  Patient Details  Name: [name] MRN: [id number] Date of Birth: Dec 08, 1945 Referring Provider:  [name], MD  Encounter Date: 09/02/2014      PT End of Session - 09/02/14 1100    Visit Number 4   Number of Visits 17   Date for PT Re-Evaluation 10/25/14   PT Start Time 5035   PT Stop Time 1104   PT Time Calculation (min) 41 min   Activity Tolerance Patient limited by fatigue   Behavior During Therapy [facility] for tasks assessed/performed      Past Medical History  Diagnosis Date  . Glaucoma   . Clotting disorder   . DVT (deep venous thrombosis)     LEFT  . GERD (gastroesophageal reflux disease)   . Hypercholesterolemia   . Peripheral neuropathy   . Essential tremor   . Diverticulosis   . Arthritis     knees and back  . Cancer     Pancreatic  . Hypertension   . Colon polyps   . UTI (lower urinary tract infection)   . Complication of anesthesia     slow to wak up   . Pulmonary embolism     Past Surgical History  Procedure Laterality Date  . Cholecystectomy    . Abdominal hysterectomy    . Knee surgery Bilateral   . Greenfield filter    . Back surgery    . Eus N/A 02/26/2014    Procedure: UPPER ENDOSCOPIC ULTRASOUND (EUS) LINEAR;  Surgeon: Beryle Beams, MD;  [facility];  Service: Endoscopy;  Laterality: N/A;  . Fine needle aspiration N/A 02/26/2014    Procedure: FINE NEEDLE ASPIRATION (FNA) LINEAR;  Surgeon: Beryle Beams, MD;  [facility]: [facility];  Service: Endoscopy;  Laterality: N/A;  . Breast lumpectomy Right   . Spine surgery    . Xi robotic assisted laparoscopic distal pancreatectomy N/A 04/08/2014    Procedure: XI ROBOTIC ASSISTED CONVERTED LAPAROSCOPIC HAND ASSITED DISTAL PANCREATECTOMY/SPLENECTOMY WITH PARTIAL COLECTOMY;  Surgeon: [name], MD;   [facility];  Service: General;  Laterality: N/A;  . Colon surgery    . Diagnostic laparoscopy      There were no vitals filed for this visit.  Visit Diagnosis:  Physical deconditioning  Weakness of both legs      Subjective Assessment - 09/02/14 1042    Subjective I'm really tired today, just didn't feel like I stopped yesterday so my legs are really tired but I want to try. My back still feels ok, no pain. Saw the doctor yesterday about my catherter and they are going to let me try one more day without it.    Currently in Pain? No/denies                         [facility] Adult PT Treatment/Exercise - 09/02/14 0001    Knee/Hip Exercises: Aerobic   Stationary Bike Recumbent bike no resistance 3:30 minutes   Knee/Hip Exercises: Standing   Other Standing Knee Exercises Bil hip 3 way raises into abduction, extension and flexion with 1 lb on ankles 2 sets of 5 reps   Knee/Hip Exercises: Seated   Other Seated Knee/Hip Exercises Marching with 1 lb on ankles today 10 reps each, LAQ 10 reps each, and toe raises, then heel raises for 1 min each  Other Seated Knee/Hip Exercises Hip adduction with small orange ball squeeze and hip abduction with green theraband 20 reps each.   Knee/Hip Exercises: Supine   Short Arc Quad Sets Strengthening;Both;3 sets  1 lb each ankle, 3 sets of 5 reps   Straight Leg Raises AROM;Both;2 sets;5 reps   Shoulder Exercises: Seated   Flexion AROM;Both;10 reps  Unable to do weights with this   Abduction AROM;Both;10 reps  Unable to do weights with this                   Short Term Clinic Goals - 08/17/14 1713    CC Short Term Goal  #1   Title Pt. will be independent in an initial home exercise program.   Time 4   Status New   CC Short Term Goal  #2   Title pt will be able to perform 8 sit to stands from 22" mat in 30 seconds to demonstrate increased fuctional capacity and strentgth   Time 4   Period Weeks   Status New              Long Term Clinic Goals - 09/02/14 1109    CC Long Term Goal  #1   Title Pt will increase average grip strength by 5  pounds in each arm.    Status On-going   CC Long Term Goal  #2   Title Patient will be independent in a basic advanced home exercise program   Status On-going   CC Long Term Goal  #3   Title pt will decrease normal TUG to 19 seconds to demonstarte increased functional balance to decrease fall risk    Status On-going   CC Long Term Goal  #4   Title pt will report 50% decrease in fatigue level so that she can participate more in family life at home    Status On-going            Plan - 09/02/14 1101    Clinical Impression Statement Pt came in very fatigued today though was able to still perform modified exercises. She will go back to urologist again today to see what next plan will be regarding catheter. Pt is a Scientist, research (physical sciences) and even though she was fatigued more than usual today, she was still willing to do what she was able and we were able to complete a whole session.    Pt will benefit from skilled therapeutic intervention in order to improve on the following deficits Abnormal gait;Decreased endurance;Impaired sensation;Decreased strength;Difficulty walking;Decreased activity tolerance   Rehab Potential Good   Clinical Impairments Affecting Rehab Potential abdominal surgery, back pathology, history of knee arthroscopies, decreased endurance    PT Frequency 2x / week   PT Duration 8 weeks   PT Treatment/Interventions ADLs/Self Care Home Management;Functional mobility training;Stair training;Gait training;Therapeutic activities;Therapeutic exercise;Balance training;Neuromuscular re-education;Patient/family education;Manual techniques;Moist Heat   PT Next Visit Plan Continue leg strengthening. Retest 3 minute walk test without stopping time at rest break.    Consulted and Agree with Plan of Care Patient        Problem List Patient Active Problem List    Diagnosis Date Noted  . Pleural effusion on left   . Anemia of chronic disease 05/15/2014  . Essential tremor 05/05/2014  . Urinary retention 05/05/2014  . Peripheral neuropathy 05/05/2014  . Glaucoma 05/05/2014  . Intra-abdominal abscess   . Malnutrition of moderate degree 04/28/2014  . 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 DVT (deep venous thrombosis) 01/11/2014  . Diverticular disease 12/22/2013  . History of DVT (deep vein thrombosis) 03/01/2011  . Hx pulmonary embolism 03/01/2011  . HTN (hypertension) 03/01/2011  . Hypercholesterolemia 03/01/2011  . Reflux 03/01/2011    Otelia Limes, PTA 09/02/2014, 11:11 AM  [place], [place], [address] Phone: [phone]   Fax:  [phone]

## 2014-09-07 ENCOUNTER — Ambulatory Visit: Payer: Medicare Other | Admitting: Physical Therapy

## 2014-09-10 ENCOUNTER — Ambulatory Visit: Payer: Medicare Other | Admitting: Physical Therapy

## 2014-09-11 ENCOUNTER — Ambulatory Visit (INDEPENDENT_AMBULATORY_CARE_PROVIDER_SITE_OTHER): Payer: Medicare Other | Admitting: Family Medicine

## 2014-09-11 ENCOUNTER — Encounter: Payer: Self-pay | Admitting: Family Medicine

## 2014-09-11 VITALS — BP 124/88 | Temp 97.9°F | Ht 69.0 in | Wt 165.0 lb

## 2014-09-11 DIAGNOSIS — R1084 Generalized abdominal pain: Secondary | ICD-10-CM

## 2014-09-11 DIAGNOSIS — R8290 Unspecified abnormal findings in urine: Secondary | ICD-10-CM | POA: Diagnosis not present

## 2014-09-11 DIAGNOSIS — N390 Urinary tract infection, site not specified: Secondary | ICD-10-CM

## 2014-09-11 LAB — POCT URINALYSIS DIPSTICK
Bilirubin, UA: NEGATIVE
GLUCOSE UA: NEGATIVE
Ketones, UA: NEGATIVE
Nitrite, UA: POSITIVE
Protein, UA: 1
RBC UA: POSITIVE
Spec Grav, UA: 1.01
Urobilinogen, UA: 1
pH, UA: 8

## 2014-09-11 MED ORDER — CEFTRIAXONE SODIUM 1 G IJ SOLR
1.0000 g | INTRAMUSCULAR | Status: DC
  Administered 2014-09-11: 1 g via INTRAMUSCULAR

## 2014-09-11 MED ORDER — CIPROFLOXACIN HCL 250 MG PO TABS: 250.0000 mg | ORAL_TABLET | Freq: Two times a day (BID) | ORAL | Status: DC

## 2014-09-11 NOTE — Patient Instructions (Signed)
Great to meet you. Please drink plenty of fluids. Take cipro as directed- 1 tablet twice daily for 7 days. Please follow up with your provider on Monday.  Go to the ER if any of your symptoms worsen.

## 2014-09-11 NOTE — Addendum Note (Signed)
Addended by: Miles Costain T on: 09/11/2014 10:45 AM   Modules accepted: Orders

## 2014-09-11 NOTE — Progress Notes (Signed)
SUBJECTIVE: [name] is a 69 y.o. female pt with complicated medical history including pancreatic cancer with recurrent DVT who has foley catheter, new to me, who presents to weekend clinic with her daughter with complaints of urinary frequency, urgency, dark colored urine and abdominal pain x 3 days, without fever, chills.  Of note, has lost significant amount of weight over past few months due to nausea.  Complicated history.  Notes reviewed in Epic.  She has a urologist, Dr. Junious Silk since catheter was placed in 03/2014 after her surgery. Was recently seen on 7/18- note reviewed- placed on macrobid. PMH significant for sulfa allergy. Wt Readings from Last 3 Encounters:  09/11/14 165 lb (74.844 kg)  06/30/14 174 lb 11.2 oz (79.243 kg)  06/09/14 183 lb 8 oz (83.235 kg)    Current Outpatient Prescriptions on File Prior to Visit  Medication Sig Dispense Refill  . atorvastatin (LIPITOR) 40 MG tablet Take 1 tablet (40 mg total) by mouth daily. 90 tablet 0  . diltiazem (CARTIA XT) 120 MG 24 hr capsule Take 1 capsule (120 mg total) by mouth daily. 90 capsule 0  . enoxaparin (LOVENOX) 120 MG/0.8ML injection Inject 0.8 mLs (120 mg total) into the skin daily. 30 Syringe 3  . HYDROcodone-acetaminophen (NORCO) 10-325 MG per tablet Take 1 tablet by mouth every 4 (four) hours as needed for severe pain. 60 tablet 0  . latanoprost (XALATAN) 0.005 % ophthalmic solution Place 1 drop into both eyes at bedtime.    . pantoprazole (PROTONIX) 40 MG tablet Take 1 tablet (40 mg total) by mouth 2 (two) times daily before a meal. 30 tablet 2  . pantoprazole (PROTONIX) 40 MG tablet Take 1 tablet (40 mg total) by mouth 2 (two) times daily before a meal. 30 tablet 2  . primidone (MYSOLINE) 50 MG tablet Take 50 mg by mouth at bedtime. Patient takes at nite    . primidone (MYSOLINE) 50 MG tablet TAKE 1 TABLET BY MOUTH AT BEDTIME 90 tablet 0  . propranolol ER (INDERAL LA) 120 MG 24 hr capsule   2  . propranolol ER  (INDERAL LA) 120 MG 24 hr capsule Take 1 capsule (120 mg total) by mouth daily. PATIENT NEEDS OFFICE VISIT FOR ADDITIONAL REFILLS 30 capsule 0  . brimonidine-timolol (COMBIGAN) 0.2-0.5 % ophthalmic solution Place 1 drop into the left eye every 12 (twelve) hours.    . mirtazapine (REMERON) 15 MG tablet Take 1 tablet (15 mg total) by mouth at bedtime. (Patient not taking: Reported on 09/11/2014) 30 tablet 1  . ondansetron (ZOFRAN-ODT) 4 MG disintegrating tablet   0  . potassium chloride SA (K-DUR,KLOR-CON) 20 MEQ tablet Take 1 tablet (20 mEq total) by mouth 2 (two) times daily. (Patient not taking: Reported on 09/11/2014) 14 tablet 1   No current facility-administered medications on file prior to visit.    Allergies  Allergen Reactions  . Sulfa Antibiotics Swelling    Past Medical History  Diagnosis Date  . Glaucoma   . Clotting disorder   . DVT (deep venous thrombosis)     LEFT  . GERD (gastroesophageal reflux disease)   . Hypercholesterolemia   . Peripheral neuropathy   . Essential tremor   . Diverticulosis   . Arthritis     knees and back  . Cancer     Pancreatic  . Hypertension   . Colon polyps   . UTI (lower urinary tract infection)   . Complication of anesthesia     slow to wak  up   . Pulmonary embolism     Past Surgical History  Procedure Laterality Date  . Cholecystectomy    . Abdominal hysterectomy    . Knee surgery Bilateral   . Greenfield filter    . Back surgery    . Eus N/A 02/26/2014    Procedure: UPPER ENDOSCOPIC ULTRASOUND (EUS) LINEAR;  Surgeon: Beryle Beams, MD;  [facility];  Service: Endoscopy;  Laterality: N/A;  . Fine needle aspiration N/A 02/26/2014    Procedure: FINE NEEDLE ASPIRATION (FNA) LINEAR;  Surgeon: Beryle Beams, MD;  [facility];  Service: Endoscopy;  Laterality: N/A;  . Breast lumpectomy Right   . Spine surgery    . Xi robotic assisted laparoscopic distal pancreatectomy N/A 04/08/2014    Procedure: XI ROBOTIC  ASSISTED CONVERTED LAPAROSCOPIC HAND ASSITED DISTAL PANCREATECTOMY/SPLENECTOMY WITH PARTIAL COLECTOMY;  Surgeon: [name], MD;  Location: [facility];  Service: General;  Laterality: N/A;  . Colon surgery    . Diagnostic laparoscopy      Family History  Problem Relation Age of Onset  . Diabetes Father   . Cancer Father 81    prostate  . Hyperlipidemia Father   . Cataracts Maternal Grandmother   . Hypertension Mother   . Arthritis Mother   . Heart disease Mother   . Glaucoma Sister   . Cancer Sister 78    Breast  . Cancer Paternal Uncle     throat +cig  . Diabetes Paternal Grandmother     Social History   Social History  . Marital Status: Divorced    Spouse Name: N/A  . Number of Children: 3  . Years of Education: 14   Occupational History  . Retired    Social History Main Topics  . Smoking status: Current Some Day Smoker -- 0.50 packs/day for 45 years    Types: Cigarettes  . Smokeless tobacco: Never Used  . Alcohol Use: 0.0 oz/week    0 Standard drinks or equivalent per week     Comment: occ- glass of brandy or wine socially   . Drug Use: No  . Sexual Activity: Not on file   Other Topics Concern  . Not on file   Social History Narrative   Divorced, 3 children   Right handed   Associates degree   3 per week   Fun: Sleep, watch tv, reading   Denies any religious beliefs that effect health care   The PMH, PSH, Social History, Family History, Medications, and allergies have been reviewed in [facility], and have been updated if relevant.  Review of Systems  Constitutional: Positive for weight loss. Negative for fever, chills and diaphoresis.  HENT: Negative.   Eyes: Negative.   Gastrointestinal: Positive for nausea and abdominal pain. Negative for vomiting.  Genitourinary: Positive for dysuria, urgency, frequency and flank pain. Negative for hematuria.  Musculoskeletal: Negative.   Skin: Negative.   Neurological: Positive for weakness.  Endo/Heme/Allergies:  Negative.     OBJECTIVE: BP 124/88 mmHg  Temp(Src) 97.9 F (36.6 C) (Oral)  Ht 5\' 9"  (1.753 m)  Wt 165 lb (74.844 kg)  BMI 24.36 kg/m2 Wt Readings from Last 3 Encounters:  09/11/14 165 lb (74.844 kg)  06/30/14 174 lb 11.2 oz (79.243 kg)  06/09/14 183 lb 8 oz (83.235 kg)    Physical Exam  Constitutional: She is oriented to person, place, and time and well-developed, well-nourished, and in no distress.  Appears uncomforable  HENT:  Head: Normocephalic.  Cardiovascular: Normal rate.  Pulmonary/Chest: Effort normal.  Abdominal: Soft. She exhibits no distension and no mass. There is tenderness. There is no rebound and no guarding.  Musculoskeletal: She exhibits no edema.  Neurological: She is alert and oriented to person, place, and time.  Skin: Skin is warm and dry. She is not diaphoretic.  Psychiatric: Mood, memory, affect and judgment normal.  Nursing note and vitals reviewed.    ASSESSMENT: UTI uncomplicated without evidence of pyelonephritis  PLAN: Treatment per orders - also push fluids, may use Pyridium OTC prn. Call or return to clinic prn if these symptoms worsen or fail to improve as anticipated.

## 2014-09-11 NOTE — Addendum Note (Signed)
Addended by: Miles Costain T on: 09/11/2014 12:56 PM   Modules accepted: Orders

## 2014-09-11 NOTE — Assessment & Plan Note (Addendum)
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 UA positive- 3+ LE, 1+ nitrite, + RBC, + protein. Concern for pyelonephritis.  Discussed with pt and her daughter- ok for outpt treatment at this time since she is keeping down fluids and VSS but given red flag symptoms requiring urgent evaluation at ER. IM rocephin 1 gram given in office, eRx sent for cipro 250 mg twice daily x 7 days. Send urine for cx. Follow up with PCP on Monday. The patient indicates understanding of these issues and agrees with the plan.

## 2014-09-14 LAB — URINE CULTURE: Colony Count: >=100000

## 2014-09-16 ENCOUNTER — Ambulatory Visit: Payer: Medicare Other

## 2014-09-21 ENCOUNTER — Ambulatory Visit: Payer: Medicare Other | Admitting: Physical Therapy

## 2014-09-21 DIAGNOSIS — R5381 Other malaise: Secondary | ICD-10-CM | POA: Diagnosis not present

## 2014-09-21 DIAGNOSIS — R29898 Other symptoms and signs involving the musculoskeletal system: Secondary | ICD-10-CM

## 2014-09-21 NOTE — Therapy (Signed)
[name], [place], [address] Phone: [phone]   Fax:  [phone]  Physical Therapy Treatment  Patient Details  Name: [name] MRN: [id number] Date of Birth: Jan 03, 1946 Referring Provider:  [name], [name]  Encounter Date: 09/21/2014      PT End of Session - 09/21/14 1214    Visit Number 5   Number of Visits 17   Date for PT Re-Evaluation 10/25/14   PT Start Time 8676   PT Stop Time 1155   PT Time Calculation (min) 50 min   Activity Tolerance Patient limited by pain   Behavior During Therapy Flat affect      Past Medical History  Diagnosis Date  . Glaucoma   . Clotting disorder   . DVT (deep venous thrombosis)     LEFT  . GERD (gastroesophageal reflux disease)   . Hypercholesterolemia   . Peripheral neuropathy   . Essential tremor   . Diverticulosis   . Arthritis     knees and back  . Cancer     Pancreatic  . Hypertension   . Colon polyps   . UTI (lower urinary tract infection)   . Complication of anesthesia     slow to wak up   . Pulmonary embolism     Past Surgical History  Procedure Laterality Date  . Cholecystectomy    . Abdominal hysterectomy    . Knee surgery Bilateral   . Greenfield filter    . Back surgery    . Eus N/A 02/26/2014    Procedure: UPPER ENDOSCOPIC ULTRASOUND (EUS) LINEAR;  Surgeon: Barbara Beams, [name];  Location: [facility];  Service: Endoscopy;  Laterality: N/A;  . Fine needle aspiration N/A 02/26/2014    Procedure: FINE NEEDLE ASPIRATION (FNA) LINEAR;  Surgeon: Barbara Beams, [name];  Location: [facility];  Service: Endoscopy;  Laterality: N/A;  . Breast lumpectomy Right   . Spine surgery    . Xi robotic assisted laparoscopic distal pancreatectomy N/A 04/08/2014    Procedure: XI ROBOTIC ASSISTED CONVERTED LAPAROSCOPIC HAND ASSITED DISTAL PANCREATECTOMY/SPLENECTOMY WITH PARTIAL COLECTOMY;  Surgeon: [name];  Location: [facility];  Service:  General;  Laterality: N/A;  . Colon surgery    . Diagnostic laparoscopy      There were no vitals filed for this visit.  Visit Diagnosis:  Physical deconditioning  Weakness of both legs      Subjective Assessment - 09/21/14 1108    Subjective pt has had 2 different treatments for a urinary tract infection and is still having symptoms of pain and dark urine.  she wants to work on getting her legs stronger today.     Patient is accompained by: Family member   Pertinent History currently has a foley catheter, but may be having a trial to remove it or to have a suprapubic cathertic. still has numvess in feet h/o DVT with a filter in place, h/o back surgery that was interrupted,hs of bilateral knee arrhoscopies.  Has good days and bad days    Patient Stated Goals to get legs and arm stronger    Currently in Pain? Yes   Pain Score 5    Pain Location Back   Pain Orientation Left;Lower;Lateral;Right   Pain Descriptors / Proofreader;Throbbing  when she take a deep breah    Pain Type Acute pain   Pain Radiating Towards towards the sides   Pain Frequency Constant  where the drains are  [facility] Adult PT Treatment/Exercise - 09/21/14 0001    Knee/Hip Exercises: Supine   Short Arc Quad Sets AROM;Strengthening;5 reps;3 sets;Both  5 reps with no weight, 5 with 1#, 5 with 2#    Straight Leg Raises PROM;Both  gentle hamstring stretch    Other Supine Knee/Hip Exercises AAROM  for hip and knee flexion with manual resistance to hip and knee extension   Other Supine Knee/Hip Exercises hip/knee bent foot flat, roll leg in and out   Shoulder Exercises: Seated   Other Seated Exercises gentle shoulder rolls within tolerance    Ankle Exercises: Supine   Other Supine Ankle Exercises AROM plantarflexion with green theraband 2 sets of 5 reps   Other Supine Ankle Exercises AAROM/PROM to ankle dorsi/plantarflexion                PT Education -  09/21/14 1213    Education provided Yes   Education Details issued flyer for Earlie Counts and pelvic floor rehab to consider try to help with incontinence issues prior to decision to get suprapubic catheter   Person(s) Educated Patient;Child(ren)   Methods Explanation;Handout   Comprehension Verbalized understanding           Short Term Clinic Goals - 09/21/14 1217    CC Short Term Goal  #1   Title Pt. will be independent in an initial home exercise program.   Status On-going   CC Short Term Goal  #2   Title pt will be able to perform 8 sit to stands from 22" mat in 30 seconds to demonstrate increased fuctional capacity and strentgth   Status On-going             Long Term Clinic Goals - 09/21/14 1217    CC Long Term Goal  #1   Title Pt will increase average grip strength by 5  pounds in each arm.    Status On-going   CC Long Term Goal  #2   Title Patient will be independent in a basic advanced home exercise program   Status On-going   CC Long Term Goal  #3   Title pt will decrease normal TUG to 19 seconds to demonstarte increased functional balance to decrease fall risk    Status On-going   CC Long Term Goal  #4   Title pt will report 50% decrease in fatigue level so that she can participate more in family life at home    Status On-going            Plan - 09/21/14 1214    Clinical Impression Statement Exercise tolerance limited by pain in core with any attempt at movement of proximal lower and upper extremities.  Pt reports she will be going back to her family doctor as her cipro has run out and she feels she still has the infection   PT Next Visit Plan Continue leg strengthening. Retest 3 minute walk test without stopping time at rest break if pt is able to tolerate it.        Problem List Patient Active Problem List   Diagnosis Date Noted  . Complicated UTI (urinary tract infection) 09/11/2014  . Pleural effusion on left   . Anemia of chronic disease  05/15/2014  . Essential tremor 05/05/2014  . Urinary retention 05/05/2014  . Peripheral neuropathy 05/05/2014  . Glaucoma 05/05/2014  . Intra-abdominal abscess   . Malnutrition of moderate degree 04/28/2014  . 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 DVT (deep venous thrombosis) 01/11/2014  . Diverticular disease 12/22/2013  . History of DVT (deep vein thrombosis) 03/01/2011  . Hx pulmonary embolism 03/01/2011  . HTN (hypertension) 03/01/2011  . Hypercholesterolemia 03/01/2011  . Reflux 03/01/2011   Donato Heinz. Owens Shark, PT  09/21/2014, 12:19 PM  [place], [place], [address] Phone: [phone]   Fax:  [phone]

## 2014-09-22 ENCOUNTER — Telehealth: Payer: Self-pay | Admitting: *Deleted

## 2014-09-22 MED ORDER — LEVOFLOXACIN 250 MG PO TABS: 250.0000 mg | ORAL_TABLET | Freq: Every day | ORAL | Status: DC

## 2014-09-22 MED ORDER — PHENAZOPYRIDINE HCL 95 MG PO TABS: 95.0000 mg | ORAL_TABLET | Freq: Three times a day (TID) | ORAL | Status: DC | PRN

## 2014-09-22 NOTE — Telephone Encounter (Signed)
Her urine culture shows the bacteria should have worked with the [place]. I will send in another prescription for Levofloxacin which is also appropriate and pyridium for the burning.

## 2014-09-22 NOTE — Telephone Encounter (Signed)
Left msg on triage stating mom was seen week ago at Sat clinic for Uti. She was rx antibiotic which she has completed. But she feel that the infection is not cleared up. Urine is still cloudy & dark, and also burning when she is urinate. Wanting to get a refill on antibiotic or recommendation on what to do....[name]

## 2014-09-23 ENCOUNTER — Telehealth: Payer: Self-pay | Admitting: *Deleted

## 2014-09-23 ENCOUNTER — Ambulatory Visit: Payer: Medicare Other | Admitting: Physical Therapy

## 2014-09-23 MED ORDER — ONDANSETRON 4 MG PO TBDP: 4.0000 mg | ORAL_TABLET | Freq: Three times a day (TID) | ORAL | Status: DC | PRN

## 2014-09-23 NOTE — Telephone Encounter (Signed)
Notified pt daughter (Chantele) with greg response...Johny Chess

## 2014-09-23 NOTE — Telephone Encounter (Signed)
Receive call pt states she is needing refill on her nausea medicine. Took meds that was sent in for UTI but she can not keep them down. Inform pt will send refill on her zofran...Johny Chess

## 2014-09-24 ENCOUNTER — Other Ambulatory Visit: Payer: Medicare Other

## 2014-09-24 ENCOUNTER — Ambulatory Visit (INDEPENDENT_AMBULATORY_CARE_PROVIDER_SITE_OTHER): Payer: Medicare Other | Admitting: Internal Medicine

## 2014-09-24 VITALS — BP 134/94 | HR 103 | Temp 98.7°F | Resp 18

## 2014-09-24 DIAGNOSIS — M545 Low back pain, unspecified: Secondary | ICD-10-CM

## 2014-09-24 DIAGNOSIS — N390 Urinary tract infection, site not specified: Secondary | ICD-10-CM

## 2014-09-24 MED ORDER — TRAMADOL HCL 50 MG PO TABS: 50.0000 mg | ORAL_TABLET | Freq: Four times a day (QID) | ORAL | Status: DC | PRN

## 2014-09-24 NOTE — Patient Instructions (Signed)
Please take the tramadol which should be less constipating and cause less nausea than the Norco

## 2014-09-24 NOTE — Progress Notes (Signed)
Pre visit review using our clinic review tool, if applicable. No additional management support is needed unless otherwise documented below in the visit note. 

## 2014-09-24 NOTE — Progress Notes (Signed)
   Subjective:    Patient ID: [name], female    DOB: 26-Sep-1945, 69 y.o.   MRN: [id number]  HPI  Her complicated course was reviewed. She was originally seen 7/25 by her Urologist and placed on nitrofurantoin for 5 days. She was subsequently seen in the Saturday clinic 09/11/14. Urine revealed 3+ leukocytes. She was placed on Cipro; culture grew out Proteus which was sensitive to the Cipro and resistant to nitrofurantoin.  Now she describes pain in her hips and back which is throbbing. She also has nausea and vomiting.  She's had associated chills and sweats.  Yesterday Azo and Levaquin were called in.  She has a narcotic pain medicine which was prescribed for pain following her Whipple's procedure in March of this year for pancreatic cancer. She stopped this as she describes significant headache and nausea after it "wears off". She does have chronic low back pain history and had surgery by Dr Maxie Better, [place].  Review of Systems She denies fever at this time. She is unable to assess dysuria or frequency as she has the catheter for urinary retention which occurred postoperatively in March.    Objective:   Physical Exam  Pertinent or positive findings include: She appears uncomfortable but in no acute distress. The right side of her scalp exhibits vitiligo. An S4 gallop is noted. Breath sounds are decreased. Bowel sounds are also decreased. Pedal pulses are decreased. She has lumbosacral area pain with rotation of the hips. Urine in the catheter is rust colored . General appearance :adequately nourished.  Eyes: No conjunctival inflammation or scleral icterus is present.  Heart:  elevated rate and regular rhythm. S1 and S2 normal without murmur, click, rub or other extra sounds    Lungs:Chest clear to auscultation; no wheezes, rhonchi,rales ,or rubs present.No increased work of breathing.   Abdomen: bowel sounds normal, soft and non-tender without masses, organomegaly or  hernias noted.  No guarding or rebound. No flank tenderness to percussion.  Vascular : all pulses equal ; no bruits present.  Skin:Warm & dry.  Intact without suspicious lesions or rashes ; no tenting    Lymphatic: No lymphadenopathy is noted about the head, neck, axilla.   Neuro: Strength, tone decreased.        Assessment & Plan:  #1 pain in her hips and back; this does not appear to be related to urinary tract infection but appears musculoskeletal. The narcotic pain medicine helped this but she has significant symptoms as the medication wears off.  Plan: Culture the urine  Change the Norco to tramadol.

## 2014-09-25 ENCOUNTER — Encounter: Payer: Self-pay | Admitting: Internal Medicine

## 2014-09-26 LAB — URINE CULTURE

## 2014-09-27 ENCOUNTER — Ambulatory Visit: Payer: Medicare Other | Admitting: Physical Therapy

## 2014-09-29 ENCOUNTER — Ambulatory Visit: Payer: Medicare Other

## 2014-09-29 DIAGNOSIS — R5381 Other malaise: Secondary | ICD-10-CM | POA: Diagnosis not present

## 2014-09-29 DIAGNOSIS — R29898 Other symptoms and signs involving the musculoskeletal system: Secondary | ICD-10-CM

## 2014-09-29 NOTE — Therapy (Addendum)
[place], [place], [address] Phone: [phone]   Fax:  [phone]  Physical Therapy Treatment  Patient Details  Name: [name] MRN: [id number] Date of Birth: [birth year] Referring Provider:  [name], MD  Encounter Date: 09/29/2014      PT End of Session - 09/29/14 1327    Visit Number 6   Number of Visits 17   Date for PT Re-Evaluation 10/25/14   PT Start Time 1302   PT Stop Time 1345   PT Time Calculation (min) 43 min   Activity Tolerance Patient limited by pain   Behavior During Therapy Flat affect      Past Medical History  Diagnosis Date  . Glaucoma   . Clotting disorder   . DVT (deep venous thrombosis)     LEFT  . GERD (gastroesophageal reflux disease)   . Hypercholesterolemia   . Peripheral neuropathy   . Essential tremor   . Diverticulosis   . Arthritis     knees and back  . Cancer     Pancreatic  . Hypertension   . Colon polyps   . UTI (lower urinary tract infection)   . Complication of anesthesia     slow to wak up   . Pulmonary embolism     Past Surgical History  Procedure Laterality Date  . Cholecystectomy    . Abdominal hysterectomy    . Knee surgery Bilateral   . Greenfield filter    . Back surgery    . Eus N/A 02/26/2014    Procedure: UPPER ENDOSCOPIC ULTRASOUND (EUS) LINEAR;  Surgeon: Beryle Beams, MD;  Location: [facility];  Service: Endoscopy;  Laterality: N/A;  . Fine needle aspiration N/A 02/26/2014    Procedure: FINE NEEDLE ASPIRATION (FNA) LINEAR;  Surgeon: Beryle Beams, MD;  Location: [facility];  Service: Endoscopy;  Laterality: N/A;  . Breast lumpectomy Right   . Spine surgery    . Xi robotic assisted laparoscopic distal pancreatectomy N/A 04/08/2014    Procedure: XI ROBOTIC ASSISTED CONVERTED LAPAROSCOPIC HAND ASSITED DISTAL PANCREATECTOMY/SPLENECTOMY WITH PARTIAL COLECTOMY;  Surgeon: [name], MD;  Location: [facility];  Service:  General;  Laterality: N/A;  . Colon surgery    . Diagnostic laparoscopy      There were no vitals filed for this visit.  Visit Diagnosis:  Physical deconditioning  Weakness of both legs      Subjective Assessment - 09/29/14 1311    Subjective Dr. Linna Darner doesn't think I have a UTI, but they did test my uring Friday to be sure and I haven't heard anything since then. He gave me tramidol for pain and it helps just a teeny, tiny bit. Basically he didn't know what was wrong, but thought it might be muscular, and told me to go to the ED if it got worse.    Currently in Pain? Yes   Pain Score 7    Pain Location Back   Pain Orientation Lower;Right;Left   Pain Descriptors / Indicators Throbbing;Constant   Pain Type Acute pain   Pain Radiating Towards towards hips evenly   Aggravating Factors  walking   Pain Relieving Factors tramidol and heat help only a little                         [facility] Adult PT Treatment/Exercise - 09/29/14 0001    Lumbar Exercises: Supine   Other Supine Lumbar Exercises PROM of bil LE's  into single knee to chest stretch, hamstring stretch and low trunk rotation all to pts tolerance and very slow and gently   Other Supine Lumbar Exercises Meeks Decompression Series 5 reps, 5 seconds each (head press, shoulder press, leg lengthener, and leg press)   Knee/Hip Exercises: Supine   Short Arc Quad Sets AROM;Strengthening;Both;2 sets;5 reps   Moist Heat Therapy   Number Minutes Moist Heat 40 Minutes   Moist Heat Location Lumbar Spine  During all supine exercises today                   Short Term Clinic Goals - 09/29/14 1325    CC Short Term Goal  #1   Title Pt. will be independent in an initial home exercise program.  Pt has been dealing with new flare up LBP for about 3 weeks now limiting her activity and exercises at home   Status On-going   CC Short Term Goal  #2   Title pt will be able to perform 8 sit to stands from 22" mat in 30  seconds to demonstrate increased fuctional capacity and strentgth  Pts recent flare up of LBP prevents this right now.   Status On-going             Long Term Clinic Goals - 09/21/14 1217    CC Long Term Goal  #1   Title Pt will increase average grip strength by 5  pounds in each arm.    Status On-going   CC Long Term Goal  #2   Title Patient will be independent in a basic advanced home exercise program   Status On-going   CC Long Term Goal  #3   Title pt will decrease normal TUG to 19 seconds to demonstarte increased functional balance to decrease fall risk    Status On-going   CC Long Term Goal  #4   Title pt will report 50% decrease in fatigue level so that she can participate more in family life at home    Status On-going            Plan - 09/29/14 1328    Clinical Impression Statement [name] was a 6/10 after treatment today. Pt continues to be very limited in tolerance due to LBP. All supine exercises were done on MHP today and pt did what she could tolerate, which was very little. Daughter reports that her mother has felt this bad for about 3 weeks now and if she doesn't improve she wants to tatke her to hospital for further testing per doctors request. But pt has a CT scan next week and she might want to wait until then depending on tolerance. Also spoke with [name] and her daughter that since she has alot of appts in next 2 weeks (cataract surgery, CT scan and check up with oncologist) and she's so limited by pain right now ok if they need to cancel next 1-2 vistis and they verbalized will see how she is feeling next week.    Pt will benefit from skilled therapeutic intervention in order to improve on the following deficits Abnormal gait;Decreased endurance;Impaired sensation;Decreased strength;Difficulty walking;Decreased activity tolerance   Rehab Potential Good   Clinical Impairments Affecting Rehab Potential abdominal surgery, back pathology, history of knee  arthroscopies, decreased endurance    PT Frequency 2x / week   PT Duration 8 weeks   PT Treatment/Interventions ADLs/Self Care Home Management;Functional mobility training;Stair training;Gait training;Therapeutic activities;Therapeutic exercise;Balance training;Neuromuscular re-education;Patient/family education;Manual techniques;Moist Heat  PT Next Visit Plan Continue leg strengthening. Retest 3 minute walk test without stopping time at rest break if pt is able to tolerate it.   Recommended Other Services Pt and daughter might go to ED if pt doesn't improve per daughters report, if not already has a CT scan appt for next week.    Consulted and Agree with Plan of Care Patient   Family Member Consulted daughter         Problem List Patient Active Problem List   Diagnosis Date Noted  . Complicated UTI (urinary tract infection) 09/11/2014  . Pleural effusion on left   . Anemia of chronic disease 05/15/2014  . Essential tremor 05/05/2014  . Urinary retention 05/05/2014  . Peripheral neuropathy 05/05/2014  . Glaucoma 05/05/2014  . Intra-abdominal abscess   . Malnutrition of moderate degree 04/28/2014  . 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 DVT (deep venous thrombosis) 01/11/2014  . Diverticular disease 12/22/2013  . History of DVT (deep vein thrombosis) 03/01/2011  . Hx pulmonary embolism 03/01/2011  . HTN (hypertension) 03/01/2011  . Hypercholesterolemia 03/01/2011  . Reflux 03/01/2011    Otelia Limes, PTA 09/29/2014, 2:12 PM   PHYSICAL THERAPY DISCHARGE SUMMARY  Visits from Start of Care: 6  Current functional level related to goals / functional outcomes: As above    Remaining deficits: pain   Education / Equipment: Exercise  Plan: Patient agrees to discharge.  Patient goals were not met. Patient is being discharged due to not returning since the last visit.  ?????       Maudry Diego, PT 02/24/2015 1:04 PM     [place], [place], [address] Phone: [phone]   Fax:  [phone]

## 2014-09-30 ENCOUNTER — Other Ambulatory Visit: Payer: Self-pay | Admitting: Family

## 2014-10-02 ENCOUNTER — Emergency Department (HOSPITAL_COMMUNITY): Payer: Medicare Other

## 2014-10-02 ENCOUNTER — Emergency Department (HOSPITAL_COMMUNITY)
Admission: EM | Admit: 2014-10-02 | Discharge: 2014-10-02 | Disposition: A | Discharge status: Home / Self Care | Payer: Medicare Other | Attending: Emergency Medicine | Admitting: Emergency Medicine

## 2014-10-02 ENCOUNTER — Encounter (HOSPITAL_COMMUNITY): Payer: Self-pay | Admitting: Emergency Medicine

## 2014-10-02 DIAGNOSIS — M1990 Unspecified osteoarthritis, unspecified site: Secondary | ICD-10-CM | POA: Insufficient documentation

## 2014-10-02 DIAGNOSIS — M25552 Pain in left hip: Secondary | ICD-10-CM | POA: Insufficient documentation

## 2014-10-02 DIAGNOSIS — Z86010 Personal history of colonic polyps: Secondary | ICD-10-CM | POA: Diagnosis not present

## 2014-10-02 DIAGNOSIS — Z862 Personal history of diseases of the blood and blood-forming organs and certain disorders involving the immune mechanism: Secondary | ICD-10-CM | POA: Diagnosis not present

## 2014-10-02 DIAGNOSIS — R1031 Right lower quadrant pain: Secondary | ICD-10-CM | POA: Diagnosis present

## 2014-10-02 DIAGNOSIS — Z86718 Personal history of other venous thrombosis and embolism: Secondary | ICD-10-CM | POA: Diagnosis not present

## 2014-10-02 DIAGNOSIS — I10 Essential (primary) hypertension: Secondary | ICD-10-CM | POA: Insufficient documentation

## 2014-10-02 DIAGNOSIS — K219 Gastro-esophageal reflux disease without esophagitis: Secondary | ICD-10-CM | POA: Diagnosis not present

## 2014-10-02 DIAGNOSIS — Z720 Tobacco use: Secondary | ICD-10-CM | POA: Insufficient documentation

## 2014-10-02 DIAGNOSIS — Z79899 Other long term (current) drug therapy: Secondary | ICD-10-CM | POA: Insufficient documentation

## 2014-10-02 DIAGNOSIS — M25551 Pain in right hip: Secondary | ICD-10-CM | POA: Diagnosis not present

## 2014-10-02 DIAGNOSIS — N390 Urinary tract infection, site not specified: Secondary | ICD-10-CM | POA: Diagnosis not present

## 2014-10-02 DIAGNOSIS — Z8669 Personal history of other diseases of the nervous system and sense organs: Secondary | ICD-10-CM | POA: Insufficient documentation

## 2014-10-02 DIAGNOSIS — E780 Pure hypercholesterolemia: Secondary | ICD-10-CM | POA: Insufficient documentation

## 2014-10-02 DIAGNOSIS — Z8507 Personal history of malignant neoplasm of pancreas: Secondary | ICD-10-CM | POA: Insufficient documentation

## 2014-10-02 DIAGNOSIS — Z86711 Personal history of pulmonary embolism: Secondary | ICD-10-CM | POA: Insufficient documentation

## 2014-10-02 LAB — URINALYSIS, ROUTINE W REFLEX MICROSCOPIC
Glucose, UA: NEGATIVE mg/dL
KETONES UR: 15 mg/dL — AB
NITRITE: NEGATIVE
PH: 6.5 (ref 5.0–8.0)
Protein, ur: 100 mg/dL — AB
Specific Gravity, Urine: 1.014 (ref 1.005–1.030)
Urobilinogen, UA: 1 mg/dL (ref 0.0–1.0)

## 2014-10-02 LAB — COMPREHENSIVE METABOLIC PANEL
ALT: 21 U/L (ref 14–54)
ANION GAP: 14 (ref 5–15)
AST: 68 U/L — ABNORMAL HIGH (ref 15–41)
Albumin: 3.5 g/dL (ref 3.5–5.0)
Alkaline Phosphatase: 62 U/L (ref 38–126)
BILIRUBIN TOTAL: 0.7 mg/dL (ref 0.3–1.2)
BUN: 7 mg/dL (ref 6–20)
CO2: 26 mmol/L (ref 22–32)
Calcium: 9.4 mg/dL (ref 8.9–10.3)
Chloride: 96 mmol/L — ABNORMAL LOW (ref 101–111)
Creatinine, Ser: 0.76 mg/dL (ref 0.44–1.00)
Glucose, Bld: 111 mg/dL — ABNORMAL HIGH (ref 65–99)
POTASSIUM: 2.8 mmol/L — AB (ref 3.5–5.1)
Sodium: 136 mmol/L (ref 135–145)
TOTAL PROTEIN: 7.6 g/dL (ref 6.5–8.1)

## 2014-10-02 LAB — I-STAT CG4 LACTIC ACID, ED
LACTIC ACID, VENOUS: 2.25 mmol/L — AB (ref 0.5–2.0)
Lactic Acid, Venous: 1.44 mmol/L (ref 0.5–2.0)

## 2014-10-02 LAB — CBC WITH DIFFERENTIAL/PLATELET
BASOS ABS: 0 10*3/uL (ref 0.0–0.1)
Basophils Relative: 0 % (ref 0–1)
EOS PCT: 3 % (ref 0–5)
Eosinophils Absolute: 0.1 10*3/uL (ref 0.0–0.7)
HEMATOCRIT: 45.1 % (ref 36.0–46.0)
Hemoglobin: 15.3 g/dL — ABNORMAL HIGH (ref 12.0–15.0)
LYMPHS PCT: 26 % (ref 12–46)
Lymphs Abs: 1.4 10*3/uL (ref 0.7–4.0)
MCH: 31.5 pg (ref 26.0–34.0)
MCHC: 33.9 g/dL (ref 30.0–36.0)
MCV: 93 fL (ref 78.0–100.0)
Monocytes Absolute: 0.5 10*3/uL (ref 0.1–1.0)
Monocytes Relative: 10 % (ref 3–12)
NEUTROS ABS: 3.3 10*3/uL (ref 1.7–7.7)
NEUTROS PCT: 61 % (ref 43–77)
PLATELETS: 164 10*3/uL (ref 150–400)
RBC: 4.85 MIL/uL (ref 3.87–5.11)
RDW: 15.3 % (ref 11.5–15.5)
WBC: 5.3 10*3/uL (ref 4.0–10.5)

## 2014-10-02 LAB — URINE MICROSCOPIC-ADD ON

## 2014-10-02 LAB — LIPASE, BLOOD: Lipase: 16 U/L — ABNORMAL LOW (ref 22–51)

## 2014-10-02 MED ORDER — DEXTROSE 5 % IV SOLN
1.0000 g | Freq: Once | INTRAVENOUS | Status: AC
  Administered 2014-10-02: 1 g via INTRAVENOUS
  Filled 2014-10-02: qty 10

## 2014-10-02 MED ORDER — IOHEXOL 300 MG/ML  SOLN
100.0000 mL | Freq: Once | INTRAMUSCULAR | Status: AC | PRN
  Administered 2014-10-02: 100 mL via INTRAVENOUS

## 2014-10-02 MED ORDER — HYDROMORPHONE HCL 1 MG/ML IJ SOLN
1.0000 mg | Freq: Once | INTRAMUSCULAR | Status: AC
  Administered 2014-10-02: 1 mg via INTRAVENOUS
  Filled 2014-10-02: qty 1

## 2014-10-02 MED ORDER — POTASSIUM CHLORIDE CRYS ER 20 MEQ PO TBCR
40.0000 meq | EXTENDED_RELEASE_TABLET | Freq: Once | ORAL | Status: AC
  Administered 2014-10-02: 40 meq via ORAL
  Filled 2014-10-02: qty 2

## 2014-10-02 MED ORDER — ONDANSETRON HCL 4 MG/2ML IJ SOLN
4.0000 mg | Freq: Once | INTRAMUSCULAR | Status: AC
  Administered 2014-10-02: 4 mg via INTRAVENOUS
  Filled 2014-10-02: qty 2

## 2014-10-02 MED ORDER — POTASSIUM CHLORIDE 10 MEQ/100ML IV SOLN
10.0000 meq | Freq: Once | INTRAVENOUS | Status: AC
  Administered 2014-10-02: 10 meq via INTRAVENOUS
  Filled 2014-10-02: qty 100

## 2014-10-02 MED ORDER — CIPROFLOXACIN HCL 500 MG PO TABS: 500.0000 mg | ORAL_TABLET | Freq: Two times a day (BID) | ORAL | Status: DC

## 2014-10-02 MED ORDER — MORPHINE SULFATE (PF) 2 MG/ML IV SOLN
2.0000 mg | Freq: Once | INTRAVENOUS | Status: AC
  Administered 2014-10-02: 2 mg via INTRAVENOUS
  Filled 2014-10-02: qty 1

## 2014-10-02 MED ORDER — SODIUM CHLORIDE 0.9 % IV BOLUS (SEPSIS)
1000.0000 mL | Freq: Once | INTRAVENOUS | Status: AC
  Administered 2014-10-02: 1000 mL via INTRAVENOUS

## 2014-10-02 MED ORDER — IOHEXOL 300 MG/ML  SOLN
50.0000 mL | Freq: Once | INTRAMUSCULAR | Status: AC | PRN
  Administered 2014-10-02: 50 mL via ORAL

## 2014-10-02 NOTE — ED Provider Notes (Signed)
CSN: [id number]     Arrival date & time 10/02/14  1426 History   First MD Initiated Contact with Patient 10/02/14 1501     Chief Complaint  Patient presents with  . Abdominal Pain  . Flank Pain  . Nausea   HPI   69 year old female presents today with complaints of abdominal pain, flank pain, nausea. Patient was seen by her urologist on 08/23/14 placed on nitrofurantoin for 5 days. She followed up in clinic on 09/10/2004 found to have 3+ Leukocytes Was Placed on Cipro, Culture Grew out Proteus Which Was Sensitive to Cipro and Resistant to Nitrofurantoin. She Followed up Again in [facility] with Pain in Her Hips and Back with Sensation of Nausea and Vomiting. She Is Placed on Azo and Levaquin. Per primary care notes patient does have a history of chronic back pain, and likely pain not related to urinary tract infection but rather musculoskeletal.  Urine culture on 09/11/2014 sensitive to Cipro Urine culture on 09/24/2014 showed no significant urinary tract infection, 75,000 colonies.  At the time of evaluation patient reports pain to her hips bilateral and back. She notes N/V with the last episode yesterday; relieved with ODT zofran. She reports decreased PO intake but is able to tolerate food and drink. She states that the pain was relieved with hydrocodone but had N/V after coming off of it. She notes that Dr. Linna Darner switched her to tramadol but this does not significantly relieve the pain. Pt denies fever, chills, chest pain, SOB, lower extremity swelling or edema.    Past Medical History  Diagnosis Date  . Glaucoma   . Clotting disorder   . DVT (deep venous thrombosis)     LEFT  . GERD (gastroesophageal reflux disease)   . Hypercholesterolemia   . Peripheral neuropathy   . Essential tremor   . Diverticulosis   . Arthritis     knees and back  . Cancer     Pancreatic  . Hypertension   . Colon polyps   . UTI (lower urinary tract infection)   . Complication of anesthesia      slow to wak up   . Pulmonary embolism    Past Surgical History  Procedure Laterality Date  . Cholecystectomy    . Abdominal hysterectomy    . Knee surgery Bilateral   . [place] filter    . Back surgery    . Eus N/A 02/26/2014    Procedure: UPPER ENDOSCOPIC ULTRASOUND (EUS) LINEAR;  Surgeon: Beryle Beams, MD;  Location: [facility];  Service: Endoscopy;  Laterality: N/A;  . Fine needle aspiration N/A 02/26/2014    Procedure: FINE NEEDLE ASPIRATION (FNA) LINEAR;  Surgeon: Beryle Beams, MD;  Location: [facility];  Service: Endoscopy;  Laterality: N/A;  . Breast lumpectomy Right   . Spine surgery    . Xi robotic assisted laparoscopic distal pancreatectomy N/A 04/08/2014    Procedure: XI ROBOTIC ASSISTED CONVERTED LAPAROSCOPIC HAND ASSITED DISTAL PANCREATECTOMY/SPLENECTOMY WITH PARTIAL COLECTOMY;  Surgeon: [name], MD;  Location: [facility];  Service: General;  Laterality: N/A;  . Colon surgery    . Diagnostic laparoscopy     Family History  Problem Relation Age of Onset  . Diabetes Father   . Cancer Father 10    prostate  . Hyperlipidemia Father   . Cataracts Maternal Grandmother   . Hypertension Mother   . Arthritis Mother   . Heart disease Mother   . Glaucoma Sister   . Cancer Sister 43  Breast  . Cancer Paternal Uncle     throat +cig  . Diabetes Paternal Grandmother    Social History  Substance Use Topics  . Smoking status: Current Some Day Smoker -- 0.50 packs/day for 45 years    Types: Cigarettes  . Smokeless tobacco: Never Used  . Alcohol Use: 0.0 oz/week    0 Standard drinks or equivalent per week     Comment: occ- glass of brandy or wine socially    OB History    No data available     Review of Systems  All other systems reviewed and are negative.  Allergies  Sulfa antibiotics  Home Medications   Prior to Admission medications   Medication Sig Start Date End Date Taking? Authorizing Provider  atorvastatin (LIPITOR) 40 MG tablet Take 1 tablet  (40 mg total) by mouth daily. 05/05/14  Yes Gerlene Fee, NP  cromolyn (OPTICROM) 4 % ophthalmic solution Place 1 drop into both eyes 4 (four) times daily.    Yes Historical Provider, MD  diltiazem (CARTIA XT) 120 MG 24 hr capsule Take 1 capsule (120 mg total) by mouth daily. 05/05/14  Yes Gerlene Fee, NP  DORZOLAMIDE HCL-TIMOLOL MAL OP Place 1 drop into the left eye 2 (two) times daily.    Yes Historical Provider, MD  enoxaparin (LOVENOX) 120 MG/0.8ML injection Inject 0.8 mLs (120 mg total) into the skin daily. 06/30/14  Yes Truitt Merle, MD  HYDROcodone-acetaminophen (NORCO) 10-325 MG per tablet Take 1 tablet by mouth every 4 (four) hours as needed for severe pain. 06/30/14  Yes Truitt Merle, MD  latanoprost (XALATAN) 0.005 % ophthalmic solution Place 1 drop into both eyes at bedtime.   Yes Historical Provider, MD  Multiple Vitamin (MULTIVITAMIN WITH MINERALS) TABS tablet Take 2 tablets by mouth daily.   Yes Historical Provider, MD  ondansetron (ZOFRAN-ODT) 4 MG disintegrating tablet Take 1 tablet (4 mg total) by mouth every 8 (eight) hours as needed for nausea or vomiting. 09/23/14  Yes Golden Circle, FNP  pantoprazole (PROTONIX) 40 MG tablet Take 1 tablet (40 mg total) by mouth 2 (two) times daily before a meal. 07/22/14  Yes Golden Circle, FNP  primidone (MYSOLINE) 50 MG tablet TAKE 1 TABLET BY MOUTH AT BEDTIME 08/25/14  Yes Rebecca S Tat, DO  propranolol ER (INDERAL LA) 120 MG 24 hr capsule TAKE 1 CAPSULE(120 MG) BY MOUTH DAILY 09/30/14  Yes Golden Circle, FNP  traMADol (ULTRAM) 50 MG tablet Take 1 tablet (50 mg total) by mouth every 6 (six) hours as needed. Patient taking differently: Take 50 mg by mouth every 6 (six) hours as needed for moderate pain.  09/24/14  Yes Hendricks Limes, MD  ciprofloxacin (CIPRO) 500 MG tablet Take 1 tablet (500 mg total) by mouth every 12 (twelve) hours. 10/02/14   Okey Regal, PA-C  levofloxacin (LEVAQUIN) 250 MG tablet Take 1 tablet (250 mg total) by mouth  daily. Patient not taking: Reported on 10/02/2014 09/22/14   Golden Circle, FNP  pantoprazole (PROTONIX) 40 MG tablet Take 1 tablet (40 mg total) by mouth 2 (two) times daily before a meal. 07/22/14   Golden Circle, FNP  phenazopyridine (PYRIDIUM) 95 MG tablet Take 1 tablet (95 mg total) by mouth 3 (three) times daily as needed for pain. Patient not taking: Reported on 10/02/2014 09/22/14   Golden Circle, FNP   BP 136/71 mmHg  Pulse 75  Temp(Src) 98.3 F (36.8 C) (Oral)  Resp 18  Ht  5\' 9"  (1.753 m)  Wt 165 lb (74.844 kg)  BMI 24.36 kg/m2  SpO2 90%   Physical Exam  Constitutional: She is oriented to person, place, and time. She appears well-developed and well-nourished.  HENT:  Head: Normocephalic and atraumatic.  Eyes: Conjunctivae are normal. Pupils are equal, round, and reactive to light. Right eye exhibits no discharge. Left eye exhibits no discharge. No scleral icterus.  Neck: Normal range of motion. Neck supple. No JVD present. No tracheal deviation present.  Cardiovascular: Regular rhythm, normal heart sounds and intact distal pulses.  Exam reveals no gallop and no friction rub.   No murmur heard. Pulmonary/Chest: Effort normal and breath sounds normal. No stridor. No respiratory distress. She has no wheezes. She has no rales. She exhibits no tenderness.  Abdominal: Soft. Bowel sounds are normal. She exhibits no distension and no mass. There is tenderness. There is no rebound, no guarding and no CVA tenderness.  Minimal tenderness to palpation of right lower quadrant  Musculoskeletal: She exhibits tenderness. She exhibits no edema.  TTP of bilateral anterior/posterior hips. Minimal pain with bilateral hip flexion. No swelling, redness, warmth to touch  Neurological: She is alert and oriented to person, place, and time. Coordination normal.  Psychiatric: She has a normal mood and affect. Her behavior is normal. Judgment and thought content normal.  Nursing note and vitals  reviewed.   ED Course  Procedures (including critical care time) Labs Review Labs Reviewed  COMPREHENSIVE METABOLIC PANEL - Abnormal; Notable for the following:    Potassium 2.8 (*)    Chloride 96 (*)    Glucose, Bld 111 (*)    AST 68 (*)    All other components within normal limits  CBC WITH DIFFERENTIAL/PLATELET - Abnormal; Notable for the following:    Hemoglobin 15.3 (*)    All other components within normal limits  URINALYSIS, ROUTINE W REFLEX MICROSCOPIC (NOT AT [facility]) - Abnormal; Notable for the following:    APPearance CLOUDY (*)    Hgb urine dipstick LARGE (*)    Bilirubin Urine LARGE (*)    Ketones, ur 15 (*)    Protein, ur 100 (*)    Leukocytes, UA MODERATE (*)    All other components within normal limits  LIPASE, BLOOD - Abnormal; Notable for the following:    Lipase 16 (*)    All other components within normal limits  I-STAT CG4 LACTIC ACID, ED - Abnormal; Notable for the following:    Lactic Acid, Venous 2.25 (*)    All other components within normal limits  URINE CULTURE  URINE MICROSCOPIC-ADD ON  I-STAT CG4 LACTIC ACID, ED    Imaging Review Ct Abdomen Pelvis W Contrast  10/02/2014   CLINICAL DATA:  69 year old female with abdominal and flank pain and nausea following antibiotic therapy for UTI. History of distal pancreatectomy for pancreatic cancer, splenectomy, partial colectomy, hysterectomy and cholecystectomy.  EXAM: CT ABDOMEN AND PELVIS WITH CONTRAST  TECHNIQUE: Multidetector CT imaging of the abdomen and pelvis was performed using the standard protocol following bolus administration of intravenous contrast.  CONTRAST:  155mL OMNIPAQUE IOHEXOL 300 MG/ML  COMPARISON:  06/15/2014 and prior CTs  FINDINGS: Lower chest: A small left pleural effusion has decreased in size in not probably loculated. Associated left basilar scarring is noted.  Hepatobiliary: Intrahepatic and CBD dilatation is unchanged. The liver is otherwise unremarkable. The patient is status post  cholecystectomy.  Pancreas: Evidence of distal pancreatectomy noted. New since 06/15/2014 is a 1.5 x 2.2 cm soft tissue  density in the area of prior pancreatic resection (image 22) and a new 1.7 x 2.3 cm possible cystic mass in the left abdomen (image 27). Although these could represent pancreatic pseudocysts, recurrent neoplasm is difficult to entirely exclude.  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  Adrenals/Urinary Tract: Bilateral renal cortical thinning is noted as well as a stable left renal cyst. There is no evidence of hydronephrosis or urinary calculi. The adrenal glands are unremarkable. A foley catheter within a slightly thick-walled bladder noted.  Stomach/Bowel: Colonic diverticulosis noted without evidence of diverticulitis. There is no evidence of bowel obstruction or definite bowel wall thickening.  Vascular/Lymphatic: No enlarged lymph nodes or abdominal aortic aneurysm. An IVC filter is again noted with prongs appearing to extend out of the IVC wall.  Reproductive: The patient is status post hysterectomy. No adnexal masses identified.  Other: No free fluid or pneumoperitoneum.  Musculoskeletal: No acute or suspicious bony abnormalities.  IMPRESSION: Foley catheter within thick-walled bladder, which may indicate infection/cystitis.  Two new soft tissue versus cystic structures in the peripancreatic region since 06/15/2014. Although these may represent pseudocysts, recurrent neoplasm is not entirely excluded and close interval follow-up or further evaluation is recommended.  Decreasing left pleural effusion which now appears slightly loculated.   Electronically Signed   By: Margarette Canada M.D.   On: 10/02/2014 17:38   I have personally reviewed and evaluated these images and lab results as part of my medical decision-making.   EKG Interpretation None      MDM   Final diagnoses:  UTI (lower urinary tract infection)    Labs: I-STAT lactic acid, CMP, CBC, lipase, urinalysis-  potassium 2.8  Imaging: CT abdomen and pelvis- see above  Consults:   Therapeutics: Morphine, Rocephin, potassium, Dilaudid, Zofran  Discharge Meds: Cipro  Assessment/Plan: Patient presents with hip and abdominal pain. I believe that she probably has musculoskeletal pain with the addition of cystitis. She has a catheter at this time, has had multiple rounds of antibiotics recently. CT shows Foley catheter with his thick-walled bladder which may indicate infection cystitis. Urine shows signs of infection. Patient was given a gram or Rocephin here in the ED, discharged home with Cipro. Most recent culture shows she was sensitive to the Cipro. Patient pain was managed here in the ED, she was given oral potassium, IV potassium, encouraged to increase her potassium intake, contact her primary care and schedule follow-up evaluation as soon as possible. She is given strict return precautions, both her, her daughter, and her son all agreed and understood to today's plan and had no further questions or concerns.  Patient care was shared with Mosetta Putt M.D. who personally evaluated the patient and agreed to my assessment and plan         Okey Regal, PA-C 10/03/14 0124  Evelina Bucy, MD 10/03/14 [phone]

## 2014-10-02 NOTE — ED Notes (Signed)
Pt dx with UTI at end of July, was given antibiotic. Began having abd/back pain and nausea after finishing the first course of antibiotics. Followed up with PCP, was given an antibiotic shot as well as a different prescription. After finishing that course of antibiotics she was still having continued symptoms, went back to PCP and received a third prescription for a different antibiotic. Has now finished the third prescription, and the pain in her mid abdomin radiates into flanks bilaterally. Complaining of pain and severe nausea in triage, states she vomited yesterday. No complaints with her BM

## 2014-10-02 NOTE — ED Notes (Signed)
Changed pt. foley

## 2014-10-02 NOTE — Discharge Instructions (Signed)
Catheter-Associated Urinary Tract Infection FAQs °WHAT IS "CATHETER-ASSOCIATED" URINARY TRACT INFECTION? °A urinary tract infection (also called "UTI") is an infection in the urinary system, which includes the bladder (which stores the urine) and the kidneys (which filter the blood to make urine). Germs (for example, bacteria or yeasts) do not normally live in these areas; but if germs are introduced, an infection can occur. If you have a urinary catheter, germs can travel along the catheter and cause an infection in your bladder or your kidney; in that case it is called a catheter-associated urinary tract infection (or "CA-UTI").  °WHAT IS A URINARY CATHETER? °A urinary catheter is a thin tube placed in the bladder to drain urine. Urine drains through the tube into a bag that collects the urine. A urinary  °catheter may be used: °· If you are not able to urinate on your own. °· To measure the amount of urine that you make, for example, during intensive care. °· During and after some types of surgery. °· During some tests of the kidneys and bladder . °People with urinary catheters have a much higher chance of getting a urinary tract infection than people who don't have a catheter. °HOW DO I GET A CATHETER-ASSOCIATED URINARY TRACT INFECTION (CA-UTI)? °If germs enter the urinary tract, they may cause an infection. Many of the germs that cause a catheter-associated urinary tract infection are common germs found in your intestines that do not usually cause an infection there. Germs can enter the urinary tract when the catheter is being put in or while the catheter remains in the bladder.  °WHAT ARE THE SYMPTOMS OF A URINARY TRACT INFECTION?  °Some of the common symptoms of a urinary tract infection are: °· Burning or pain in the lower abdomen (that is, below the stomach). °· Fever. °· Bloody urine may be a sign of infection, but is also caused by other problems . °· Burning during urination or an increase in the  frequency of urination after the catheter is removed. °Sometimes people with catheter-associated urinary tract infections do not have these symptoms of infection. °CAN CATHETER-ASSOCIATED URINARY TRACT INFECTIONS BE TREATED? °Yes, most catheter-associated urinary tract infections can be treated with antibiotics and removal or change of the catheter. Your doctor will determine which antibiotic is best for you.  °WHAT ARE SOME OF THE THINGS THAT HOSPITALS ARE DOING TO PREVENT CATHETER-ASSOCIATED URINARY TRACT INFECTIONS? °To prevent urinary tract infections, doctors and nurses take the following actions.  °Catheter insertion °· Catheters are put in only when necessary and they are removed as soon as possible. °· Only properly trained persons insert catheters using sterile ("clean") technique. °· The skin in the area where the catheter will be inserted is cleaned before inserting the catheter. °· Other methods to drain the urine are sometimes used, such as: °¨ External catheters in men (these look like condoms and are placed over the penis rather than into the penis) °¨ Putting a temporary catheter in to drain the urine and removing it right away. This is called intermittent urethral catheterization. °Catheter care °· Healthcare providers clean their hands by washing them with soap and water or using an alcohol-based hand rub before and after touching your catheter. °¨ If you do not see your providers clean their hands, please ask them to do so. °· Avoid disconnecting the catheter and drain tube. This helps to prevent germs from getting into the catheter tube. °· The catheter is secured to the leg to prevent pulling on the   catheter.  Avoid twisting or kinking the catheter.  Keep the bag lower than the bladder to prevent urine from backflowing to the bladder.  Empty the bag regularly. The drainage spout should not touch anything while emptying the bag. WHAT CAN I DO TO HELP PREVENT CATHETER-ASSOCIATED URINARY  TRACT INFECTIONS IF I HAVE A CATHETER?  Always clean your hands before and after doing catheter care.  Always keep your urine bag below the level of your bladder.  Do not tug or pull on the tubing.  Do not twist or kink the catheter tubing.  Ask your healthcare provider each day if you still need the catheter. WHAT DO I NEED TO DO WHEN I [place]?  If you will be going home with a catheter, your doctor or nurse should explain everything you need to know about taking care of the catheter. Make sure you understand how to care for it before you leave the hospital.  If you develop any of the symptoms of a urinary tract infection, such as burning or pain in the lower abdomen, fever, or an increase in the frequency of urination, contact your doctor or nurse immediately.  Before you go home, make sure you know who to contact if you have questions or problems after you get home. If you have questions, please ask your doctor or nurse. Developed and co-sponsored by [employer] for [place] [id number]); Infectious Diseases Society of [employer] (IDSA); [employer] [place]; Association for Professionals in Infection Control and Epidemiology (APIC); Center for Disease Control (CDC); and The Joint Commission Document Released: 10/10/2011 Document Reviewed: 10/10/2011 [facility] Patient Information 2015 [place]. This information is not intended to replace advice given to you by your health care provider. Make sure you discuss any questions you have with your health care provider.  Please use Anaprox as directed, please follow-up with primary care provider for reevaluation as soon as possible.

## 2014-10-02 NOTE — ED Notes (Signed)
2L of O2 removed from pt will continue to monitor O2 sats on RA.

## 2014-10-02 NOTE — ED Notes (Signed)
O2 noted to be 88% on RA prior to morphine administration.  Per pt she does not wear home O2.  Pt placed on 2L of O2 via [place] w an improvement in O2 sats to 92%.

## 2014-10-04 LAB — URINE CULTURE

## 2014-10-05 ENCOUNTER — Other Ambulatory Visit: Payer: Self-pay | Admitting: Family

## 2014-10-05 ENCOUNTER — Telehealth: Payer: Self-pay | Admitting: *Deleted

## 2014-10-05 ENCOUNTER — Ambulatory Visit: Payer: Medicare Other | Admitting: Physical Therapy

## 2014-10-05 ENCOUNTER — Telehealth (HOSPITAL_BASED_OUTPATIENT_CLINIC_OR_DEPARTMENT_OTHER): Payer: Self-pay | Admitting: Emergency Medicine

## 2014-10-05 NOTE — Telephone Encounter (Signed)
Received vm call from pt stating that CT is scheduled for tomorrow am but pt went to ED Sat & had CT Abd & pelvis & she wants to know if she still needs to get CT chest.  Reviewed chart & do not see that scan is scheduled.  Melissa/Scheduling will f/u with CT to get r/s.  Informed daughter to cancel lab tomorrow & we will get lab day of CT.  Hopefully we can get CT before 10/13/14 appt with Dr. Burr Medico.  Asked daughter to call if she doesn't hear from anybody in next few days.

## 2014-10-05 NOTE — Telephone Encounter (Signed)
Post ED Visit - Positive Culture Follow-up  Culture report reviewed by antimicrobial stewardship pharmacist:  []  Heide Guile, Pharm.D., BCPS []  Alycia Rossetti, Pharm.D., BCPS []  [place], Pharm.D., BCPS, AAHIVP []  Legrand Como, Pharm.D., BCPS, AAHIVP [x]  [employer], Pharm.D. []  Cassie Nicole Kindred, [place].D.  Positive urine culture  Treated with ciprofloxacin, organism sensitive to the same and no further patient follow-up is required at this time.  Hazle Nordmann 10/05/2014, 10:12 AM

## 2014-10-06 ENCOUNTER — Other Ambulatory Visit: Payer: Medicare Other

## 2014-10-07 ENCOUNTER — Ambulatory Visit (HOSPITAL_COMMUNITY)
Admission: RE | Admit: 2014-10-07 | Discharge: 2014-10-07 | Disposition: A | Discharge status: Home / Self Care | Payer: Medicare Other | Source: Ambulatory Visit | Attending: Hematology | Admitting: Hematology

## 2014-10-07 ENCOUNTER — Encounter: Payer: Medicare Other | Admitting: Physical Therapy

## 2014-10-07 ENCOUNTER — Encounter (HOSPITAL_COMMUNITY): Payer: Self-pay

## 2014-10-07 DIAGNOSIS — C252 Malignant neoplasm of tail of pancreas: Secondary | ICD-10-CM | POA: Insufficient documentation

## 2014-10-07 MED ORDER — IOHEXOL 300 MG/ML  SOLN
75.0000 mL | Freq: Once | INTRAMUSCULAR | Status: AC | PRN
  Administered 2014-10-07: 75 mL via INTRAVENOUS

## 2014-10-13 ENCOUNTER — Telehealth: Payer: Self-pay | Admitting: Hematology

## 2014-10-13 ENCOUNTER — Ambulatory Visit (HOSPITAL_BASED_OUTPATIENT_CLINIC_OR_DEPARTMENT_OTHER): Payer: Medicare Other

## 2014-10-13 ENCOUNTER — Encounter: Payer: Self-pay | Admitting: Hematology

## 2014-10-13 ENCOUNTER — Ambulatory Visit (HOSPITAL_BASED_OUTPATIENT_CLINIC_OR_DEPARTMENT_OTHER): Payer: Medicare Other | Admitting: Hematology

## 2014-10-13 VITALS — BP 106/62 | HR 70 | Temp 98.2°F | Resp 18 | Ht 69.0 in | Wt 155.1 lb

## 2014-10-13 DIAGNOSIS — N390 Urinary tract infection, site not specified: Secondary | ICD-10-CM

## 2014-10-13 DIAGNOSIS — C252 Malignant neoplasm of tail of pancreas: Secondary | ICD-10-CM

## 2014-10-13 DIAGNOSIS — Z86718 Personal history of other venous thrombosis and embolism: Secondary | ICD-10-CM

## 2014-10-13 DIAGNOSIS — I82403 Acute embolism and thrombosis of unspecified deep veins of lower extremity, bilateral: Secondary | ICD-10-CM

## 2014-10-13 DIAGNOSIS — Z7901 Long term (current) use of anticoagulants: Secondary | ICD-10-CM | POA: Diagnosis not present

## 2014-10-13 MED ORDER — ENOXAPARIN SODIUM 100 MG/ML ~~LOC~~ SOLN: 100.0000 mg | SUBCUTANEOUS | Status: AC

## 2014-10-13 MED ORDER — ONDANSETRON 4 MG PO TBDP: 4.0000 mg | ORAL_TABLET | Freq: Three times a day (TID) | ORAL | Status: DC | PRN

## 2014-10-13 NOTE — Telephone Encounter (Signed)
Pt confirmed labs/ov per 09/14 POF, gave pt AVS and Calendar.... KJ °

## 2014-10-13 NOTE — Progress Notes (Signed)
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 CT chest was negative.    02/26/2014 Pathology Results FNA of pancreatic mass showed adenocarcinoma    03/22/2014 Initial Diagnosis Pancreatic adenocarcinoma   04/08/2014 Surgery robotic converted to hand-assisted laparoscopic distal pancreatectomy, splenectomy, partial colectomy, and partial removal of the left renal capsule for pancreatic adenocarcinoma. Surgical margins were negative. pT3N0   04/08/2014 - 05/04/2014 Hospital Admission admitted for pancreatic surgery, complicated with abdominal abcess, which was drained and treated with abx for pseudomonas infection, difficulty with eating and ambulatory, malnutrition, was discharged to rehab     04/08/2014 Pathology Results pT3N0, moderately differentiated invasive adenocarcinoma, spanning 4.5 cm, invading colonic submucosa. Lymphovascular invasion is identified, perineural invasion is identified. 10 lymph nodes were negative. Surgical margins were negative.   05/09/2014 - 05/21/2014 Hospital Admission Pt was admited for chest and abdominal pain, found to have left side pleural effusion. thoracentesis  done by IR with 575ml fluids removed, cytology (-) malignant cells    HISTORY OF PRESENTING ILLNESS:  [name] 69 y.o. female is here because of her recurrent DVT and incidental discovered pancreatic mass.  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 She had IVC filter placed back then and put on coumadin. She had second episode of right leg DVT when she was on coumadin in 2009, and continued coumadin until one year ago when she had trouble for monitoring her INR. She was then switched to Aspirin 81mg  dialy. 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 CT abdomen was ordered to further evaluate the DVT and abdominal pain on 12/22/2013, which showed thrombus in IVC and right common iliac vein below IVC. CT scan also showed a 2.8 x 2.5 cm mass within the tail of pancreas, this was confirmed by the MRI abdomen, with probably encasement of the splenic vessels. No significant abdominal adenopathy. CT of the chest was negative for distant metastasis. She was referred to see GI Dr.Hung and will have EGD/EUS for pancreatic mass biopsy in a week.   She has been having abdominal pain after eating in the pat few month, along with diarrhea and constipation, which has been chronic since her cholecystectomy in 199. Her appetite is fare, and weight is stable. She also has tremor since teenager, and got worse lately, along with gait instaedyness, was seen by neurologist Dr. Carles Collet. She was put on primidone which has help quite bit.   She had both knee surgery  and was using a crane until recently when she was diagnosed with DVT and she has been using a walker. She has been having some gait instability lately, she was referred by Dr. Carles Collet and MRI of brain showed mild chronic  small vessel ischemia disease and cerebral atrophy.  INTERIM HISTORY Lynnie returns for follow-up with her daughter. She has been having persistent mid to low abdominal pain since her pancreatic surgery, and recurrent UTI in the past 2 months, she has not be able to remove her Foley since his surgery. She was taking Vicodin for the pain, which controls the pain very well, but she has headaches when the medication wears off. Her primary care physician has switched her to tramadol, which helps some, but not as good as Vicodin. She has been also following with her urologist, gets her Foley catheter changed once a months, and she was recently seen at the ED 10 days ago for recurrent UTI. She just finished a course of antibiotics. She has intermittent nausea, takes Zofran, appetite is low, not eating well, she lost about 30 lbs in the past 3-4 months.   MEDICAL HISTORY:  Past Medical History  Diagnosis Date  . Glaucoma   . Clotting disorder   . DVT (deep venous thrombosis)     LEFT  . GERD (gastroesophageal reflux disease)   . Hypercholesterolemia   . Peripheral neuropathy   . Essential tremor   . Diverticulosis   . Arthritis     knees and back  . Cancer     Pancreatic  . Hypertension   . Colon polyps   . UTI (lower urinary tract infection)   . Complication of anesthesia     slow to wak up   . Pulmonary embolism     SURGICAL HISTORY: Past Surgical History  Procedure Laterality Date  . Cholecystectomy    . Abdominal hysterectomy    . Knee surgery Bilateral   . [place] filter    . Back surgery    . Eus N/A 02/26/2014    Procedure: UPPER ENDOSCOPIC ULTRASOUND (EUS) LINEAR;  Surgeon: Beryle Beams, MD;  Location: [facility];  Service: Endoscopy;  Laterality: N/A;  . Fine needle aspiration N/A 02/26/2014    Procedure: FINE NEEDLE ASPIRATION (FNA) LINEAR;  Surgeon: Beryle Beams, MD;  Location: [facility];  Service: Endoscopy;  Laterality: N/A;  . Breast lumpectomy Right   .  Spine surgery    . Xi robotic assisted laparoscopic distal pancreatectomy N/A 04/08/2014    Procedure: XI ROBOTIC ASSISTED CONVERTED LAPAROSCOPIC HAND ASSITED DISTAL PANCREATECTOMY/SPLENECTOMY WITH PARTIAL COLECTOMY;  Surgeon: [name], MD;  Location: [facility];  Service: General;  Laterality: N/A;  . Colon surgery    . Diagnostic laparoscopy      SOCIAL HISTORY: History   Social History  . Marital Status: Divorced    Spouse Name: N/A    Number of Children: 3  . Years of Education: N/A   Occupational History   She used to work for [employer], retired now    Social History Main Topics  . Smoking status: Current Every Day Smoker -- 0.50 packs/day, for 40 years     Types: Cigarettes  . Smokeless tobacco: Not on file  . Alcohol Use: 0.0 oz/week, social drinker     0 Not specified per week     Comment: occ- glass of wine a week   . Drug Use: No  . Sexual Activity: Not on file   Other Topics  Concern  . Not on file   Social History Narrative   Divorced, 3 children   Right handed   Associates degree   3 per week    FAMILY HISTORY: Family History  Problem Relation Age of Onset  . Diabetes Father 70  . Diabetes Maternal Grandmother   . Hypertension Mother   . Glaucoma Sister   . Cataracts Maternal Grandmother   . Cancer Sister 66    Breast    ALLERGIES:  is allergic to sulfa antibiotics.  MEDICATIONS:  Current Outpatient Prescriptions  Medication Sig Dispense Refill  . atorvastatin (LIPITOR) 40 MG tablet Take 1 tablet (40 mg total) by mouth daily. 90 tablet 0  . cromolyn (OPTICROM) 4 % ophthalmic solution Place 1 drop into both eyes 4 (four) times daily.     Marland Kitchen diltiazem (CARTIA XT) 120 MG 24 hr capsule Take 1 capsule (120 mg total) by mouth daily. 90 capsule 0  . DORZOLAMIDE HCL-TIMOLOL MAL OP Place 1 drop into the left eye 2 (two) times daily.     Marland Kitchen enoxaparin (LOVENOX) 120 MG/0.8ML injection Inject 0.8 mLs (120 mg total) into the skin daily. 30 Syringe 3   . HYDROcodone-acetaminophen (NORCO) 10-325 MG per tablet Take 1 tablet by mouth every 4 (four) hours as needed for severe pain. 60 tablet 0  . ketorolac (ACULAR) 0.4 % SOLN 1 drop 4 (four) times daily.    Marland Kitchen latanoprost (XALATAN) 0.005 % ophthalmic solution Place 1 drop into both eyes at bedtime.    . Multiple Vitamin (MULTIVITAMIN WITH MINERALS) TABS tablet Take 2 tablets by mouth daily.    Marland Kitchen ofloxacin (OCUFLOX) 0.3 % ophthalmic solution 1 drop 4 (four) times daily.    . ondansetron (ZOFRAN-ODT) 4 MG disintegrating tablet DISSOLVE 1 TABLET(4 MG) ON THE TONGUE EVERY 8 HOURS AS NEEDED FOR NAUSEA OR VOMITING 20 tablet 0  . pantoprazole (PROTONIX) 40 MG tablet Take 1 tablet (40 mg total) by mouth 2 (two) times daily before a meal. 30 tablet 2  . prednisoLONE acetate (PRED FORTE) 1 % ophthalmic suspension 1 drop 4 (four) times daily.    . primidone (MYSOLINE) 50 MG tablet TAKE 1 TABLET BY MOUTH AT BEDTIME 90 tablet 0  . propranolol ER (INDERAL LA) 120 MG 24 hr capsule TAKE 1 CAPSULE(120 MG) BY MOUTH DAILY 30 capsule 0  . traMADol (ULTRAM) 50 MG tablet Take 1 tablet (50 mg total) by mouth every 6 (six) hours as needed. (Patient taking differently: Take 50 mg by mouth every 6 (six) hours as needed for moderate pain. ) 30 tablet 0  . enoxaparin (LOVENOX) 100 MG/ML injection Inject 1 mL (100 mg total) into the skin daily. 30 Syringe 3  . ondansetron (ZOFRAN-ODT) 4 MG disintegrating tablet Take 1 tablet (4 mg total) by mouth every 8 (eight) hours as needed for nausea or vomiting. 30 tablet 3   Current Facility-Administered Medications  Medication Dose Route Frequency Provider Last Rate Last Dose  . cefTRIAXone (ROCEPHIN) injection 1 g  1 g Intramuscular Q24H Lucille Passy, MD   1 g at 09/11/14 1044    REVIEW OF SYSTEMS:   Constitutional: Denies fevers, chills or abnormal night sweats, (+) fatigue  Eyes: Denies blurriness of vision, double vision or watery eyes Ears, nose, mouth, throat, and face: Denies  mucositis or sore throat Respiratory: Denies cough, dyspnea or wheezes Cardiovascular: Denies palpitation, chest discomfort or lower extremity swelling Gastrointestinal:  Denies nausea, heartburn or change in bowel habits. (+) intermittent abdominal pain  Skin: Denies abnormal skin rashes Lymphatics: Denies new lymphadenopathy or easy bruising Neurological:Denies numbness, tingling or new weaknesses Behavioral/Psych: Mood is stable, no new changes  Extremities: Bilateral lower extremity edema and leg pain, left more than right. All other systems were reviewed with the patient and are negative.  PHYSICAL EXAMINATION: ECOG PERFORMANCE STATUS: 3 - Symptomatic, >50% confined to bed  Filed Vitals:   10/13/14 0906  BP: 106/62  Pulse: 70  Temp: 98.2 F (36.8 C)  Resp: 18   Filed Weights   10/13/14 0906  Weight: 155 lb 1.6 oz (70.353 kg)    GENERAL:alert, no distress and comfortable, sitting in wheelchair.  SKIN: skin color, texture, turgor are normal, no rashes. She has some bruises on her abdomen at the Lovenox injection site. She also has a 2 cm subcutaneous nodule on the right flank area, likely a hematoma. EYES: normal, conjunctiva are pink and non-injected, sclera clear OROPHARYNX:no exudate, no erythema and lips, buccal mucosa, and tongue normal  NECK: supple, thyroid normal size, non-tender, without nodularity LYMPH:  no palpable lymphadenopathy in the cervical, axillary or inguinal LUNGS: clear to auscultation and percussion with normal breathing effort HEART: regular rate & rhythm and no murmurs and no lower extremity edema ABDOMEN:abdomen soft, mild tenderness at mid and low abdomen, normal bowel sounds Musculoskeletal:no cyanosis of digits and no clubbing  PSYCH: alert & oriented x 3 with fluent speech NEURO: no focal motor/sensory deficits Ext: (+) Bilateral Ankle swelling  LABORATORY DATA:  I have reviewed the data as listed Lab Results  Component Value Date   WBC  5.3 10/02/2014   HGB 15.3* 10/02/2014   HCT 45.1 10/02/2014   MCV 93.0 10/02/2014   PLT 164 10/02/2014   CMP Latest Ref Rng 10/02/2014 06/30/2014 06/09/2014  Glucose 65 - 99 mg/dL 111(H) 141(H) 115(H)  BUN 6 - 20 mg/dL 7 4.6(L) 6  Creatinine 0.44 - 1.00 mg/dL 0.76 0.8 0.98  Sodium 135 - 145 mmol/L 136 142 137  Potassium 3.5 - 5.1 mmol/L 2.8(L) 3.1(L) 4.2  Chloride 101 - 111 mmol/L 96(L) - 105  CO2 22 - 32 mmol/L 26 24 24   Calcium 8.9 - 10.3 mg/dL 9.4 8.8 9.1  Total Protein 6.5 - 8.1 g/dL 7.6 7.8 -  Total Bilirubin 0.3 - 1.2 mg/dL 0.7 0.26 -  Alkaline Phos 38 - 126 U/L 62 107 -  AST 15 - 41 U/L 68(H) 36(H) -  ALT 14 - 54 U/L 21 14 -     PATHOLOGY REPORT 04/08/2014 REASON FOR ADDENDUM, AMENDMENT OR CORRECTION: [id number]: AMENDMENT: Amendment issued to report additional gross information in part #2. ds 04/14/14 04:55:45 PM FINAL DIAGNOSIS Diagnosis 04/08/2014 1. Colon, segmental resection, transverse - PANCREATIC ADENOCARCINOMA INVADING COLONIC SUBMUCOSA. - LYMPHOVASCULAR INVASION IS IDENTIFIED. - THE COLONIC RESECTION MARGINS ARE NEGATIVE FOR ADENOCARCINOMA. 2. Pancreas, resection - INVASIVE ADENOCARCINOMA, MODERATELY DIFFERENTIATED, SPANNING 4.5 CM. - LYMPHOVASCULAR INVASION IS IDENTIFIED. - PERINEURAL INVASION IS IDENTIFIED. - ADENOCARCINOMA IS BROADLY PRESENT AT T 10 mg 1 negative. Surgical margins were negative. HE CLINICAL COLONIC MARGIN OF SPECIMEN #2. - ADENOCARCINOMA IS ADHERENT TO THE RENAL CAPSULE, BUT DOES NOT INVOLVE RENAL PARENCHYMA. - ADENOCARCINOMA IS ADHERENT TO THE SPLENIC CAPSULE, BUT DOES NOT INVOLVE SPLENIC PARENCHYMA. - THE PANCREATIC PARENCHYMAL RESECTION MARGIN IS NEGATIVE FOR ADENOCARCINOMA. - THERE IS NO EVIDENCE OF CARCINOMA IN 10 OF 10 LYMPH NODES (0/10). - SEE ONCOLOGY TABLE BELOW. Microscopic Comment 2. PANCREAS (EXOCRINE): Procedure: Distal pancreatectomy with adherent renal capsule and portion of spleen and transverse colon  resection. Tumor  Site: Distal pancreatic tail. Tumor Size: 4.5 cm (gross measurement). Histologic Type: Adenocarcinoma. Histologic Grade (ductal carcinoma only): G2: Moderately differentiated Microscopic Tumor Extension: Adenocarcinoma involves colonic submucosa, renal capsule, and splenic capsule. Margins: The pancreatic parenchymal margin is negative for adenocarcinoma. Distance of invasive carcinoma from closest margin: 5.2 cm to the pancreatic parenchymal margin, see comment. Carcinoma in situ / high-grade dysplasia: Not identified. Treatment Effect: N/A. 1 of 3 Amended copy Amended FINAL for Scantlin, Ninel A ([id number]) Microscopic Comment(continued) Lymph-Vascular Invasion: Present, diffuse Perineural Invasion: Present, diffuse Lymph nodes: number examined 10; number positive: 0. Pathologic Staging: pT3, pN0. Additional Pathologic Findings: No significant findings. Ancillary Studies: N/A. Comment(s): There is a primary 4.5 cm moderately differentiated pancreatic adenocarcinoma present in the tail of the pancreas. There is direct extension of this tumor to the submucosa of the transverse colon (specimen #1). In addition, adenocarcinoma extends to and involves the renal capsule and splenic capsule. Despite four lymph nodes being negative for parenchymal metastases, one of the four lymph nodes does show perinodal deposits of adenocarcinoma. The adenocarcinoma present in the submucosa of the transverse colon, renal capsule, splenic capsule, and perinodal soft tissue is interpreted to represent direct extension of the primary tumor, rather than metastatic sites and thus, the tumor is staged as above.  Comment There are malignant cells with prominent nucleili and intracytoplasmic vacuoles, arranged in acinar pattern. The overall morphologic findings favor a diagnosis of well differentiated adenocarcinoma.  RADIOGRAPHIC STUDIES: I have personally reviewed the radiological images as listed and agreed  with the findings in the report.  Ct Abdomen Pelvis W Contrast 10/02/2014    IMPRESSION: Foley catheter within thick-walled bladder, which may indicate infection/cystitis.  Two new soft tissue versus cystic structures in the peripancreatic region since 06/15/2014. Although these may represent pseudocysts, recurrent neoplasm is not entirely excluded and close interval follow-up or further evaluation is recommended.  Decreasing left pleural effusion which now appears slightly loculated. CT chest 10/07/2014 IMPRESSION: 1. Stable small left pleural effusion with associated passive atelectasis. No new lung nodule or other significant new findings in the chest. Stable small left internal mammary node warrants observation.   ASSESSMENT & PLAN:  69 year old female, with past medical history of recurrent bilateral lower extremity DVTs, presented with acute extensive left lower extremity DVT and thrombus in the IVC and the right, iliac vein, and chronic DVT in the right popliteal vein. She was also found to have a pancreatic tail mass on the CT scan.  1. Pancreatic adenocarcinoma, pT3 N0 M0, stage IIA -I reviewed her surgical pathology result with her and her daughter.  her surgical margin was negative.  -I discussed the role of adjuvant chemotherapy after her surgery to reduce the risk of cancer recurrence after surgery. We usually recommend single agent gemcitabine as adjuvant therapy, however she had multiple complications after surgery, and was not a candidate for adjuvant chemo -I discussed her restaging CT scan findings. There are two small soft tissue vs cystic lesion in pancrease, indeterminate, I will check her CA19.9 today -clinically, she is doing poorly, with persistent low abdominal pain, which is similar to the pain she had before her surgery, which is concerning. I will review her scan in our GI tumor board next week.   -will either get a abdomen MRI or CT in 2-3 month for close  follow up   2. Recurrent DVT  -I reviewed her lab results of prothrombin gene mutation, factor V Leyden mutation, and antithrombin III, which were  all negative.  -Giving her recurrent unprovoked DVT, I recommend anticoagulation indefinitely. -she has some pain at injection sites, we discussed other A/C options, such as coumadin and xarelto, due to the cost issue, she wants to remain on lovenox   -continue lovenox, I change in her 120 mg twice daily to 100 mg once daily, based on her current weight. Her renal function is normal. I sent the prescription to her pharmacy   3. Recurrent UTI -she is following with her PCP and urologist  -her abdominal pain maybe related to this and foley also   Plan: -Return to clinic in 3 months with lab and CT scans  -check CA19.9 today -discuss in tumor board next week.    All questions were answered. The patient knows to call the clinic with any problems, questions or concerns. I spent 30 minutes counseling the patient face to face. The total time spent in the appointment was 40 minutes and more than 50% was on counseling.     Truitt Merle, MD 10/13/2014   10:04 PM

## 2014-10-14 ENCOUNTER — Other Ambulatory Visit: Payer: Self-pay | Admitting: Urology

## 2014-10-14 DIAGNOSIS — N319 Neuromuscular dysfunction of bladder, unspecified: Secondary | ICD-10-CM

## 2014-10-14 LAB — CANCER ANTIGEN 19-9: CA 19-9: 3.6 U/mL (ref <35.0)

## 2014-10-14 NOTE — Progress Notes (Signed)
Spoke with daughter BEYA TIPPS, and informed her re:  No Lovenox injection on Mon 10/18/14 in preparation for Suprapubic Catheter procedure on 10/19/14.   Instructed daughter to clarify with radiologist as to when pt can resume her Lovenox injection post procedure. Informed daughter  also that pt's tumor marker is  normal.  Dr. Burr Medico will discuss pt's case in the GI tumor board next week.   Daughter voiced understanding.

## 2014-10-15 ENCOUNTER — Telehealth: Payer: Self-pay | Admitting: Family

## 2014-10-15 NOTE — Telephone Encounter (Signed)
Patient called to advise that the traMADol (ULTRAM) 50 MG tablet [[id number]] is not working, and she would like something else.

## 2014-10-18 ENCOUNTER — Other Ambulatory Visit: Payer: Self-pay | Admitting: Radiology

## 2014-10-18 NOTE — Telephone Encounter (Signed)
#  30 Until she makes appt with Marya Amsler in [place]

## 2014-10-18 NOTE — Telephone Encounter (Signed)
I have not seen her for this problem. Therefore she will need a follow up office visit or speak with Dr. Linna Darner.

## 2014-10-19 ENCOUNTER — Encounter (HOSPITAL_COMMUNITY): Payer: Self-pay

## 2014-10-19 ENCOUNTER — Ambulatory Visit (HOSPITAL_COMMUNITY)
Admission: RE | Admit: 2014-10-19 | Discharge: 2014-10-19 | Disposition: A | Discharge status: Home / Self Care | Payer: Medicare Other | Source: Ambulatory Visit | Attending: Urology | Admitting: Urology

## 2014-10-19 ENCOUNTER — Other Ambulatory Visit: Payer: Self-pay | Admitting: Urology

## 2014-10-19 DIAGNOSIS — K219 Gastro-esophageal reflux disease without esophagitis: Secondary | ICD-10-CM | POA: Insufficient documentation

## 2014-10-19 DIAGNOSIS — Z86711 Personal history of pulmonary embolism: Secondary | ICD-10-CM | POA: Diagnosis not present

## 2014-10-19 DIAGNOSIS — Z8507 Personal history of malignant neoplasm of pancreas: Secondary | ICD-10-CM | POA: Insufficient documentation

## 2014-10-19 DIAGNOSIS — F17210 Nicotine dependence, cigarettes, uncomplicated: Secondary | ICD-10-CM | POA: Insufficient documentation

## 2014-10-19 DIAGNOSIS — I10 Essential (primary) hypertension: Secondary | ICD-10-CM | POA: Insufficient documentation

## 2014-10-19 DIAGNOSIS — G250 Essential tremor: Secondary | ICD-10-CM | POA: Insufficient documentation

## 2014-10-19 DIAGNOSIS — Z79899 Other long term (current) drug therapy: Secondary | ICD-10-CM | POA: Insufficient documentation

## 2014-10-19 DIAGNOSIS — Z882 Allergy status to sulfonamides status: Secondary | ICD-10-CM | POA: Insufficient documentation

## 2014-10-19 DIAGNOSIS — R338 Other retention of urine: Secondary | ICD-10-CM | POA: Diagnosis present

## 2014-10-19 DIAGNOSIS — N312 Flaccid neuropathic bladder, not elsewhere classified: Secondary | ICD-10-CM | POA: Insufficient documentation

## 2014-10-19 DIAGNOSIS — Z87440 Personal history of urinary (tract) infections: Secondary | ICD-10-CM | POA: Diagnosis not present

## 2014-10-19 DIAGNOSIS — Z86718 Personal history of other venous thrombosis and embolism: Secondary | ICD-10-CM | POA: Insufficient documentation

## 2014-10-19 DIAGNOSIS — E780 Pure hypercholesterolemia: Secondary | ICD-10-CM | POA: Diagnosis not present

## 2014-10-19 DIAGNOSIS — N319 Neuromuscular dysfunction of bladder, unspecified: Secondary | ICD-10-CM | POA: Insufficient documentation

## 2014-10-19 DIAGNOSIS — G629 Polyneuropathy, unspecified: Secondary | ICD-10-CM | POA: Diagnosis not present

## 2014-10-19 LAB — CBC
HCT: 45.8 % (ref 36.0–46.0)
HEMOGLOBIN: 14.6 g/dL (ref 12.0–15.0)
MCH: 30.5 pg (ref 26.0–34.0)
MCHC: 31.9 g/dL (ref 30.0–36.0)
MCV: 95.8 fL (ref 78.0–100.0)
PLATELETS: 206 10*3/uL (ref 150–400)
RBC: 4.78 MIL/uL (ref 3.87–5.11)
RDW: 15.7 % — ABNORMAL HIGH (ref 11.5–15.5)
WBC: 5.1 10*3/uL (ref 4.0–10.5)

## 2014-10-19 LAB — PROTIME-INR
INR: 0.98 (ref 0.00–1.49)
PROTHROMBIN TIME: 13.2 s (ref 11.6–15.2)

## 2014-10-19 MED ORDER — MIDAZOLAM HCL 2 MG/2ML IJ SOLN
INTRAMUSCULAR | Status: AC
  Filled 2014-10-19: qty 4

## 2014-10-19 MED ORDER — CEFAZOLIN SODIUM-DEXTROSE 2-3 GM-% IV SOLR
2.0000 g | Freq: Once | INTRAVENOUS | Status: AC
  Administered 2014-10-19: 2 g via INTRAVENOUS
  Filled 2014-10-19: qty 50

## 2014-10-19 MED ORDER — CEFAZOLIN SODIUM-DEXTROSE 2-3 GM-% IV SOLR
INTRAVENOUS | Status: AC
  Filled 2014-10-19: qty 50

## 2014-10-19 MED ORDER — FENTANYL CITRATE (PF) 100 MCG/2ML IJ SOLN
INTRAMUSCULAR | Status: AC | PRN
  Administered 2014-10-19 (×4): 25 ug via INTRAVENOUS

## 2014-10-19 MED ORDER — SODIUM CHLORIDE 0.9 % IV SOLN
INTRAVENOUS | Status: DC
  Administered 2014-10-19: 500 mL via INTRAVENOUS

## 2014-10-19 MED ORDER — FENTANYL CITRATE (PF) 100 MCG/2ML IJ SOLN
INTRAMUSCULAR | Status: AC
  Filled 2014-10-19: qty 2

## 2014-10-19 MED ORDER — MIDAZOLAM HCL 2 MG/2ML IJ SOLN
INTRAMUSCULAR | Status: AC | PRN
  Administered 2014-10-19 (×2): 0.5 mg via INTRAVENOUS
  Administered 2014-10-19: 1 mg via INTRAVENOUS

## 2014-10-19 NOTE — Discharge Instructions (Signed)
Suprapubic Catheter Replacement Care After Refer to this sheet in the next few weeks. These instructions provide you with information on caring for yourself after changing your catheter. Your caregiver may also give you more specific instructions. Call your caregiver if you have any problems or questions after you change your catheter. HOME CARE INSTRUCTIONS   Take all medicines prescribed by your caregiver. Follow the directions carefully.  Drink 8 glasses of water every day. This produces good urine flow.  Check the skin around your catheter a few times every day. Watch for redness and swelling. Look for any fluids coming out of the opening.  Do not use powder or cream around the catheter opening.  Do not take tub baths or use pools or hot tubs.  Keep all follow-up appointments. SEEK MEDICAL CARE IF:   You leak urine.  Your skin around the catheter becomes red or sore.  Your urine flow slows down.  Your urine gets cloudy or smelly. SEEK IMMEDIATE MEDICAL CARE IF:   You have chills, nausea, or back pain.  You have trouble changing your catheter.  Your catheter comes out.  You have blood in your urine.  You have no urine flow for 1 hour.  You have a fever. Document Released: 10/03/2010 Document Revised: 04/09/2011 Document Reviewed: 10/03/2010 [facility] Patient Information 2015 [place], [place]. This information is not intended to replace advice given to you by your health care provider. Make sure you discuss any questions you have with your health care provider. Suprapubic Catheter Home Guide A suprapubic catheter is a rubber tube with a tiny balloon on the end. It is used to drain urine from the bladder. This catheter is put in your bladder through a small opening in the lower center part of your abdomen. Suprapubic refers to the area right above your pubic bone. The balloon on the end of the catheter is filled with germ-free (sterile) water. This keeps the catheter from  slipping out. When the catheter is in place, your urine will drain into a collection bag. The bag can be put beside your bed at night or attached to your leg during the day. HOW TO CARE FOR YOUR CATHETER  Cleaning your skin  Clean the skin around the catheter opening every day.  Wash your hands with soap and water.  Clean the skin around the opening with a clean washcloth and soapy water. Do not pull on the tube.  Pat the area dry with a clean towel.  Your caregiver may want you to put a bandage (dressing) over the site. Do not use ointment on this area unless your caregiver tells you to. Cleaning the catheter  Ask your caregiver if you need to clean the catheter and how often.  Use only soap and water.  There may be crusts on the catheter. Put hydrogen peroxide on a cotton ball or gauze pad to remove any crust. Emptying the collection bag  You may have a large drainage bag to use at night and a smaller one for daytime. Empty the large bag every 8 hours. Empty the small bag when it is about  full.  Keep the drainage bag below the level of the catheter. This keeps urine from flowing backwards.  Hold the bag over the toilet or another container. Release the valve (spigot) at the bottom of the bag. Do not touch the opening of the spigot. Do not let the opening touch the toilet or container.  Close the spigot tightly when the bag is  empty. Cleaning the collection bag  Clean the bag every few days.  First, wash your hands.  Disconnect the tubing from the catheter. Replace the used bag with a new bag. Then you can clean the used one.  Empty the used bag completely. Rinse it out with warm water and soap or fill the bag with water and add 1 teaspoon of vinegar. Let it sit for about 30 minutes. Then drain.  The bag should be completely dry before storing it. Put it inside a plastic bag to keep it clean. Checking everything  Always make sure there are no kinks in the catheter or  tubing.  Always make sure there are no leaks in the catheter, tubing, or collection bag. HOW TO CHANGE YOUR CATHETER Sometimes, a caregiver will change your suprapubic catheter. Other times, you may need to change it yourself. This may be the case if you need to wear a catheter for a long time. Usually, they need to be changed every 4 to 6 weeks. Ask your caregiver how often yours should be changed. Your caregiver will help you order the following supplies for home delivery:  Sterile gloves.  Catheters.  Syringes.  Sterile water.  Sterile cleaning solution.  Lubricant.  Drainage bags. Changing your catheter  Drink plenty of fluids before changing the catheter.  Wash your hands with soap and water.  Lie on your back and put on sterile gloves.  Clean the skin around the catheter opening. Use the sterile cleaning solution.  Use a syringe to get the water out of the balloon from the old catheter.  Slowly remove the catheter.  Take off the first pair of gloves, and put on a new pair. Then put lubricant on the tip of the new catheter. Put the new catheter through the opening.  Wait for some urine to start flowing. Then, use the other syringe to fill the balloon with sterile water.  Attach the catheter to your drainage bag. Make sure the connection is tight. Important warnings  The catheter should come out easily. If it seems stuck, do not pull it.  Call your caregiver right away if you have any trouble while changing the catheter.  When the old catheter is removed, the new one should be put in right away. This is because the opening will close quickly. If you have a problem, go to an emergency clinic right away. RISKS AND COMPLICATIONS  Urine flow can become blocked. This can happen if the catheter or tubes are not working right. A blood clot can also block urine flow.  The catheter might irritate tissue in your body. This can cause bleeding.  The skin near the opening  for the catheter may become irritated or infected.  Bacteria may get into your bladder. This can cause a urinary tract infection. HOME CARE INSTRUCTIONS  Take all medicines prescribed by your caregiver. Follow the directions carefully.  Drink 8 glasses of water every day. This produces good urine flow.  Check the skin around your catheter a few times every day. Watch for redness and swelling. Look for any fluids coming out of the opening.  Do not use powder or cream around the catheter opening.  Do not take tub baths or use pools or hot tubs.  Keep all follow-up appointments. SEEK MEDICAL CARE IF:  You leak urine.  Your skin around the catheter becomes red or sore.  Your urine flow slows down.  Your urine gets cloudy or smelly. SEEK IMMEDIATE MEDICAL CARE IF:  You have chills, nausea, or back pain.  You have trouble changing your catheter.  Your catheter comes out.  You have blood in your urine.  You have no urine flow for 1 hour.  You have a fever. Document Released: 10/03/2010 Document Revised: 04/09/2011 Document Reviewed: 10/03/2010 [facility] Patient Information 2015 [place], [place]. This information is not intended to replace advice given to you by your health care provider. Make sure you discuss any questions you have with your health care provider.Conscious Sedation, Adult, Care After Refer to this sheet in the next few weeks. These instructions provide you with information on caring for yourself after your procedure. Your health care provider may also give you more specific instructions. Your treatment has been planned according to current medical practices, but problems sometimes occur. Call your health care provider if you have any problems or questions after your procedure. WHAT TO EXPECT AFTER THE PROCEDURE  After your procedure:  You may feel sleepy, clumsy, and have poor balance for several hours.  Vomiting may occur if you eat too soon after the  procedure. HOME CARE INSTRUCTIONS  Do not participate in any activities where you could become injured for at least 24 hours. Do not:  Drive.  Swim.  Ride a bicycle.  Operate heavy machinery.  Cook.  Use power tools.  Climb ladders.  Work from a high place.  Do not make important decisions or sign legal documents until you are improved.  If you vomit, drink water, juice, or soup when you can drink without vomiting. Make sure you have little or no nausea before eating solid foods.  Only take over-the-counter or prescription medicines for pain, discomfort, or fever as directed by your health care provider.  Make sure you and your family fully understand everything about the medicines given to you, including what side effects may occur.  You should not drink alcohol, take sleeping pills, or take medicines that cause drowsiness for at least 24 hours.  If you smoke, do not smoke without supervision.  If you are feeling better, you may resume normal activities 24 hours after you were sedated.  Keep all appointments with your health care provider. SEEK MEDICAL CARE IF:  Your skin is pale or bluish in color.  You continue to feel nauseous or vomit.  Your pain is getting worse and is not helped by medicine.  You have bleeding or swelling.  You are still sleepy or feeling clumsy after 24 hours. SEEK IMMEDIATE MEDICAL CARE IF:  You develop a rash.  You have difficulty breathing.  You develop any type of allergic problem.  You have a fever. MAKE SURE YOU:  Understand these instructions.  Will watch your condition.  Will get help right away if you are not doing well or get worse. Document Released: 11/05/2012 Document Reviewed: 11/05/2012 [facility] Patient Information 2015 [place], [place]. This information is not intended to replace advice given to you by your health care provider. Make sure you discuss any questions you have with your health care provider.

## 2014-10-19 NOTE — Telephone Encounter (Signed)
Per Linus Orn pt will need to come in and get reevaluated by PCP in order to get another medication.

## 2014-10-19 NOTE — Telephone Encounter (Signed)
pts daughter called back and I made an appt with Marya Amsler for tomorrow

## 2014-10-19 NOTE — H&P (Signed)
Chief Complaint: Patient was seen in consultation today for supra pubic catheter--neurogenic bladder at the request of Eskridge,Matthew  Referring Physician(s): Eskridge,Matthew  History of Present Illness: [name] is a 69 y.o. female   Pt with Hx Pancreatic Ca 2015 Whipple surgery 2016 Many abdominal surgeries Continual issue with acontractile bladder Urinary retention Chronic foley catheter Now with recurrence UTI and more difficult foley exchanges Atonic neurogenic bladder Dr Junious Silk request placement of supra pubic catheter  Now scheduled for same Hx DVT---LD Lovenox Sunday 9/18  Past Medical History  Diagnosis Date  . Glaucoma   . Clotting disorder   . DVT (deep venous thrombosis)     LEFT  . GERD (gastroesophageal reflux disease)   . Hypercholesterolemia   . Peripheral neuropathy   . Essential tremor   . Diverticulosis   . Arthritis     knees and back  . Cancer     Pancreatic  . Hypertension   . Colon polyps   . UTI (lower urinary tract infection)   . Complication of anesthesia     slow to wak up   . Pulmonary embolism     Past Surgical History  Procedure Laterality Date  . Cholecystectomy    . Abdominal hysterectomy    . Knee surgery Bilateral   . [place] filter    . Back surgery    . Eus N/A 02/26/2014    Procedure: UPPER ENDOSCOPIC ULTRASOUND (EUS) LINEAR;  Surgeon: Beryle Beams, MD;  Location: [facility];  Service: Endoscopy;  Laterality: N/A;  . Fine needle aspiration N/A 02/26/2014    Procedure: FINE NEEDLE ASPIRATION (FNA) LINEAR;  Surgeon: Beryle Beams, MD;  Location: [facility];  Service: Endoscopy;  Laterality: N/A;  . Breast lumpectomy Right   . Spine surgery    . Xi robotic assisted laparoscopic distal pancreatectomy N/A 04/08/2014    Procedure: XI ROBOTIC ASSISTED CONVERTED LAPAROSCOPIC HAND ASSITED DISTAL PANCREATECTOMY/SPLENECTOMY WITH PARTIAL COLECTOMY;  Surgeon: [name], MD;  Location: [facility];  Service:  General;  Laterality: N/A;  . Colon surgery    . Diagnostic laparoscopy    . Cataract extraction      left eye    Allergies: Sulfa antibiotics  Medications: Prior to Admission medications   Medication Sig Start Date End Date Taking? Authorizing Provider  atorvastatin (LIPITOR) 40 MG tablet Take 1 tablet (40 mg total) by mouth daily. 05/05/14  Yes Gerlene Fee, NP  ciprofloxacin (CIPRO) 500 MG tablet Take 500 mg by mouth 2 (two) times daily.   Yes Historical Provider, MD  cromolyn (OPTICROM) 4 % ophthalmic solution Place 1 drop into both eyes 4 (four) times daily.    Yes Historical Provider, MD  diltiazem (CARTIA XT) 120 MG 24 hr capsule Take 1 capsule (120 mg total) by mouth daily. 05/05/14  Yes Gerlene Fee, NP  DORZOLAMIDE HCL-TIMOLOL MAL OP Place 1 drop into the left eye 2 (two) times daily.    Yes Historical Provider, MD  enoxaparin (LOVENOX) 100 MG/ML injection Inject 1 mL (100 mg total) into the skin daily. 10/13/14  Yes Truitt Merle, MD  HYDROcodone-acetaminophen (NORCO) 10-325 MG per tablet Take 1 tablet by mouth every 4 (four) hours as needed for severe pain. 06/30/14  Yes Truitt Merle, MD  ketorolac (ACULAR) 0.4 % SOLN Place 1 drop into the left eye 4 (four) times daily.    Yes Historical Provider, MD  latanoprost (XALATAN) 0.005 % ophthalmic solution Place 1 drop into both eyes at  bedtime.   Yes Historical Provider, MD  Multiple Vitamin (MULTIVITAMIN WITH MINERALS) TABS tablet Take 2 tablets by mouth daily.   Yes Historical Provider, MD  ofloxacin (OCUFLOX) 0.3 % ophthalmic solution Place 1 drop into the left eye 4 (four) times daily.    Yes Historical Provider, MD  ondansetron (ZOFRAN-ODT) 4 MG disintegrating tablet DISSOLVE 1 TABLET(4 MG) ON THE TONGUE EVERY 8 HOURS AS NEEDED FOR NAUSEA OR VOMITING 10/05/14  Yes Golden Circle, FNP  ondansetron (ZOFRAN-ODT) 4 MG disintegrating tablet Take 1 tablet (4 mg total) by mouth every 8 (eight) hours as needed for nausea or vomiting. 10/13/14  Yes  Truitt Merle, MD  pantoprazole (PROTONIX) 40 MG tablet Take 1 tablet (40 mg total) by mouth 2 (two) times daily before a meal. 07/22/14  Yes Golden Circle, FNP  prednisoLONE acetate (PRED FORTE) 1 % ophthalmic suspension 1 drop 4 (four) times daily.   Yes Historical Provider, MD  primidone (MYSOLINE) 50 MG tablet TAKE 1 TABLET BY MOUTH AT BEDTIME 08/25/14  Yes Rebecca S Tat, DO  propranolol ER (INDERAL LA) 120 MG 24 hr capsule TAKE 1 CAPSULE(120 MG) BY MOUTH DAILY 09/30/14  Yes Golden Circle, FNP  traMADol (ULTRAM) 50 MG tablet Take 1 tablet (50 mg total) by mouth every 6 (six) hours as needed. Patient taking differently: Take 50 mg by mouth every 6 (six) hours as needed for moderate pain.  09/24/14  Yes Hendricks Limes, MD  enoxaparin (LOVENOX) 120 MG/0.8ML injection Inject 0.8 mLs (120 mg total) into the skin daily. 06/30/14   Truitt Merle, MD     Family History  Problem Relation Age of Onset  . Diabetes Father   . Cancer Father 54    prostate  . Hyperlipidemia Father   . Cataracts Maternal Grandmother   . Hypertension Mother   . Arthritis Mother   . Heart disease Mother   . Glaucoma Sister   . Cancer Sister 30    Breast  . Cancer Paternal Uncle     throat +cig  . Diabetes Paternal Grandmother     Social History   Social History  . Marital Status: Divorced    Spouse Name: N/A  . Number of Children: 3  . Years of Education: 14   Occupational History  . Retired    Social History Main Topics  . Smoking status: Current Some Day Smoker -- 0.50 packs/day for 45 years    Types: Cigarettes  . Smokeless tobacco: Never Used  . Alcohol Use: 0.0 oz/week    0 Standard drinks or equivalent per week     Comment: occ- glass of brandy or wine socially   . Drug Use: No  . Sexual Activity: Not Asked   Other Topics Concern  . None   Social History Narrative   Divorced, 3 children   Right handed   Associates degree   3 per week   Fun: Sleep, watch tv, reading   Denies any religious  beliefs that effect health care    Review of Systems: A 12 point ROS discussed and pertinent positives are indicated in the HPI above.  All other systems are negative.  Review of Systems  Constitutional: Negative for activity change and unexpected weight change.  Respiratory: Negative for shortness of breath.   Neurological: Negative for weakness.  Psychiatric/Behavioral: Negative for behavioral problems and confusion.    Vital Signs: Ht 5\' 9"  (1.753 m)  Wt 155 lb 1 oz (70.336 kg)  BMI 22.89  kg/m2  Physical Exam  Constitutional: She is oriented to person, place, and time.  Cardiovascular: Normal rate and normal heart sounds.   Pulmonary/Chest: Effort normal and breath sounds normal.  Abdominal: Soft. Bowel sounds are normal.  Foley catheter in place  Musculoskeletal: Normal range of motion.  + tremors Uses walker  Neurological: She is alert and oriented to person, place, and time.  Skin: Skin is warm and dry.  Psychiatric: She has a normal mood and affect. Her behavior is normal. Judgment and thought content normal.  Nursing note and vitals reviewed.   Mallampati Score:  MD Evaluation Airway: WNL Heart: WNL Abdomen: WNL Chest/ Lungs: WNL ASA  Classification: 3 Mallampati/Airway Score: Two  Imaging: Ct Chest W Contrast  10/07/2014   CLINICAL DATA:  Pancreatic cancer diagnosed in 2015. Prior Whipple procedure in March 2016. Today's CT chest is part of a restaging workup.  EXAM: CT CHEST WITH CONTRAST  TECHNIQUE: Multidetector CT imaging of the chest was performed during intravenous contrast administration.  CONTRAST:  79mL OMNIPAQUE IOHEXOL 300 MG/ML  SOLN  COMPARISON:  Multiple exams, including 10/02/2014 and 05/29/2014  FINDINGS: Mediastinum/Nodes: 4 mm left internal mammary node on image 35 series 2, formerly 5 mm in diameter. No overtly pathologic thoracic adenopathy. Mild atherosclerotic calcification of the aortic arch.  Lungs/Pleura: Left pleural effusion stable from 5  days ago. There still some associated passive atelectasis. The focal pleural mass like thickening posteriorly on the left is not as readily apparent on today's exam as it was on 05/29/2014.  No pulmonary nodule is identified.  Upper abdomen: Stable appearance from 5 days ago including ill-defined lobular and possibly postoperative densities in the vicinity of the the resected pancreatic tail.  Musculoskeletal: Unremarkable  IMPRESSION: 1. Stable small left pleural effusion with associated passive atelectasis. No new lung nodule or other significant new findings in the chest. Stable small left internal mammary node warrants observation.   Electronically Signed   By: Van Clines M.D.   On: 10/07/2014 17:25   Ct Abdomen Pelvis W Contrast  10/02/2014   CLINICAL DATA:  69 year old female with abdominal and flank pain and nausea following antibiotic therapy for UTI. History of distal pancreatectomy for pancreatic cancer, splenectomy, partial colectomy, hysterectomy and cholecystectomy.  EXAM: CT ABDOMEN AND PELVIS WITH CONTRAST  TECHNIQUE: Multidetector CT imaging of the abdomen and pelvis was performed using the standard protocol following bolus administration of intravenous contrast.  CONTRAST:  171mL OMNIPAQUE IOHEXOL 300 MG/ML  COMPARISON:  06/15/2014 and prior CTs  FINDINGS: Lower chest: A small left pleural effusion has decreased in size in not probably loculated. Associated left basilar scarring is noted.  Hepatobiliary: Intrahepatic and CBD dilatation is unchanged. The liver is otherwise unremarkable. The patient is status post cholecystectomy.  Pancreas: Evidence of distal pancreatectomy noted. New since 06/15/2014 is a 1.5 x 2.2 cm soft tissue density in the area of prior pancreatic resection (image 22) and a new 1.7 x 2.3 cm possible cystic mass in the left abdomen (image 27). Although these could represent pancreatic pseudocysts, recurrent neoplasm is difficult to entirely exclude.  Spleen: The  patient is status post splenectomy.  Adrenals/Urinary Tract: Bilateral renal cortical thinning is noted as well as a stable left renal cyst. There is no evidence of hydronephrosis or urinary calculi. The adrenal glands are unremarkable. A foley catheter within a slightly thick-walled bladder noted.  Stomach/Bowel: Colonic diverticulosis noted without evidence of diverticulitis. There is no evidence of bowel obstruction or definite bowel wall thickening.  Vascular/Lymphatic: No enlarged lymph nodes or abdominal aortic aneurysm. An IVC filter is again noted with prongs appearing to extend out of the IVC wall.  Reproductive: The patient is status post hysterectomy. No adnexal masses identified.  Other: No free fluid or pneumoperitoneum.  Musculoskeletal: No acute or suspicious bony abnormalities.  IMPRESSION: Foley catheter within thick-walled bladder, which may indicate infection/cystitis.  Two new soft tissue versus cystic structures in the peripancreatic region since 06/15/2014. Although these may represent pseudocysts, recurrent neoplasm is not entirely excluded and close interval follow-up or further evaluation is recommended.  Decreasing left pleural effusion which now appears slightly loculated.   Electronically Signed   By: Margarette Canada M.D.   On: 10/02/2014 17:38    Labs:  CBC:  Recent Labs  05/29/14 0046 06/30/14 0841 10/02/14 1512 10/19/14 1155  WBC 6.4 6.2 5.3 5.1  HGB 9.2* 12.4 15.3* 14.6  HCT 29.8* 38.9 45.1 45.8  PLT 526* 521* 164 206    COAGS:  Recent Labs  04/08/14 0630 04/09/14 0404 05/09/14 0655 10/19/14 1155  INR 1.06 1.12 1.08 0.98  APTT 40* 33  --   --     BMP:  Recent Labs  05/19/14 0434 05/21/14 0518  05/29/14 0046 06/09/14 1703 06/30/14 0841 10/02/14 1512  NA 138 137  < > 135 137 142 136  K 3.7 3.4*  < > 2.7* 4.2 3.1* 2.8*  CL 107 106  --  106 105  --  96*  CO2 23 22  < > 21 24 24 26   GLUCOSE 91 92  < > 101* 115* 141* 111*  BUN 8 8  < > 5* 6 4.6* 7    CALCIUM 8.4 8.6  < > 8.6 9.1 8.8 9.4  CREATININE 0.85 0.78  < > 0.75 0.98 0.8 0.76  GFRNONAA 69* 85*  --  86*  --   --  >60  GFRAA 80* >90  --  >90  --   --  >60  < > = values in this interval not displayed.  LIVER FUNCTION TESTS:  Recent Labs  05/27/14 1351 05/29/14 0046 06/30/14 0841 10/02/14 1512  BILITOT <0.20 0.7 0.26 0.7  AST 47* 38* 36* 68*  ALT 14 13 14 21   ALKPHOS 93 77 107 62  PROT 7.5 7.1 7.8 7.6  ALBUMIN 2.9* 3.1* 2.6* 3.5    TUMOR MARKERS:  Recent Labs  05/06/14 1317 05/27/14 1347 10/13/14 1011  CA199 1.2 6.1 3.6    Assessment and Plan:  Pancreatic Ca/ whipple surgery 2016 Ongoing urinary retention- chronic foley catheter Atonic neurogenic bladder Now scheduled for supra pubic catheter placement per Dr Junious Silk Pt and dtr aware of procedure benefits and risks including but limited to Infection, bleeding, organ damage; damage to surrounding structures Agreeable to proceed Consent signed andin chart  Thank you for this interesting consult.  I greatly enjoyed meeting [name] and look forward to participating in their care.  A copy of this report was sent to the requesting provider on this date.  Signed: TURPIN,PAMELA A 10/19/2014, 1:20 PM   I spent a total of  30 Minutes   in face to face in clinical consultation, greater than 50% of which was counseling/coordinating care for supra pubic catheter placement

## 2014-10-19 NOTE — Procedures (Signed)
Technically successful [place] and CT guided placed of a 12 Fr drainage catheter placement into the urinary bladder yielding the return of normal, non foul smelling urine.     No immediate post procedural complications.   Ronny Bacon, MD Pager #: [phone]

## 2014-10-20 ENCOUNTER — Telehealth: Payer: Self-pay | Admitting: Hematology

## 2014-10-20 ENCOUNTER — Encounter: Payer: Self-pay | Admitting: Family

## 2014-10-20 ENCOUNTER — Other Ambulatory Visit: Payer: Self-pay | Admitting: Hematology

## 2014-10-20 ENCOUNTER — Ambulatory Visit (INDEPENDENT_AMBULATORY_CARE_PROVIDER_SITE_OTHER): Payer: Medicare Other | Admitting: Family

## 2014-10-20 VITALS — BP 112/84 | HR 81 | Temp 98.2°F | Resp 18 | Ht 69.0 in | Wt 152.0 lb

## 2014-10-20 DIAGNOSIS — K219 Gastro-esophageal reflux disease without esophagitis: Secondary | ICD-10-CM | POA: Diagnosis not present

## 2014-10-20 DIAGNOSIS — IMO0001 Reserved for inherently not codable concepts without codable children: Secondary | ICD-10-CM

## 2014-10-20 DIAGNOSIS — R1084 Generalized abdominal pain: Secondary | ICD-10-CM

## 2014-10-20 MED ORDER — PANTOPRAZOLE SODIUM 40 MG PO TBEC: 40.0000 mg | DELAYED_RELEASE_TABLET | Freq: Two times a day (BID) | ORAL | Status: AC

## 2014-10-20 MED ORDER — OXYCODONE-ACETAMINOPHEN 7.5-325 MG PO TABS: 1.0000 | ORAL_TABLET | Freq: Four times a day (QID) | ORAL | Status: DC | PRN

## 2014-10-20 NOTE — Patient Instructions (Signed)
Thank you for choosing [facility].  Summary/Instructions:  Your prescription(s) have been submitted to your pharmacy or been printed and provided for you. Please take as directed and contact our office if you believe you are having problem(s) with the medication(s) or have any questions.  If your symptoms worsen or fail to improve, please contact our office for further instruction, or in case of emergency go directly to the emergency room at the closest medical facility.   The goal of pain medication is to ease the pain, but it will not get rid of it completely. Do not take more pain medicine than needed.   Do NOT drive while taking narcotic pain medications.   Do NOT drink alcohol while taking prescription pain medication.   Please use over-the-counter medications for constipation, such as miralax, senakot, colace, or magnesium citrate, while you are taking narcotic pain medication.  Pay attention to the side effects of any pain medication  Do NOT exceed 3000 mg of acetaminophen (Tylenol) TOTAL in a 24 hour period. (Please be aware that you may have acetaminophen in other medications that you are taking.)     

## 2014-10-20 NOTE — Progress Notes (Signed)
Subjective:    Patient ID: [name], female    DOB: 24-Oct-1945, 69 y.o.   MRN: [id number]  Chief Complaint  Patient presents with  . Pain medication    Refill of protonix, Pt states that she has abdominal pain not back pain that is so bad that it goes to her back, hydrocodone works but when it wears off it gives her a bad headache and tramadol does not work    HPI:  [name] is a 69 y.o. female who  has a past medical history of Glaucoma; Clotting disorder; DVT (deep venous thrombosis); GERD (gastroesophageal reflux disease); Hypercholesterolemia; Peripheral neuropathy; Essential tremor; Diverticulosis; Arthritis; Cancer; Hypertension; Colon polyps; UTI (lower urinary tract infection); Complication of anesthesia; and Pulmonary embolism. and presents today for a follow up office visit.   1.) Abdominal pain - Associated symptom of pain located in her lower abdomen that has been going on since just before she had her pancreatic surgery. She has recently been seen by urology and a new suprapubic catheter has been placed in the last 24 hours as there was potential for the pain to be related to her catheter and multiple urinary tract infections. Describes the pain as constant and throbbing. It does not seem to go away. She was maintained on hydrocodone-acetaminophen and notes that when the medication wore off she would get headaches and nausea. Reports taking the medication twice every couple of days. Was recently prescribed tramadol during an office visit which she reports is ineffective. Questions what the next step for determining where the pain is coming from and treating it.  Allergies  Allergen Reactions  . Sulfa Antibiotics Swelling     Current Outpatient Prescriptions on File Prior to Visit  Medication Sig Dispense Refill  . atorvastatin (LIPITOR) 40 MG tablet Take 1 tablet (40 mg total) by mouth daily. 90 tablet 0  . ciprofloxacin (CIPRO) 500 MG tablet Take 500 mg by  mouth 2 (two) times daily.    . cromolyn (OPTICROM) 4 % ophthalmic solution Place 1 drop into both eyes 4 (four) times daily.     Marland Kitchen diltiazem (CARTIA XT) 120 MG 24 hr capsule Take 1 capsule (120 mg total) by mouth daily. 90 capsule 0  . DORZOLAMIDE HCL-TIMOLOL MAL OP Place 1 drop into the left eye 2 (two) times daily.     Marland Kitchen enoxaparin (LOVENOX) 100 MG/ML injection Inject 1 mL (100 mg total) into the skin daily. 30 Syringe 3  . ketorolac (ACULAR) 0.4 % SOLN Place 1 drop into the left eye 4 (four) times daily.     Marland Kitchen latanoprost (XALATAN) 0.005 % ophthalmic solution Place 1 drop into both eyes at bedtime.    . Multiple Vitamin (MULTIVITAMIN WITH MINERALS) TABS tablet Take 2 tablets by mouth daily.    Marland Kitchen ofloxacin (OCUFLOX) 0.3 % ophthalmic solution Place 1 drop into the left eye 4 (four) times daily.     . ondansetron (ZOFRAN-ODT) 4 MG disintegrating tablet DISSOLVE 1 TABLET(4 MG) ON THE TONGUE EVERY 8 HOURS AS NEEDED FOR NAUSEA OR VOMITING 20 tablet 0  . prednisoLONE acetate (PRED FORTE) 1 % ophthalmic suspension 1 drop 4 (four) times daily.    . primidone (MYSOLINE) 50 MG tablet TAKE 1 TABLET BY MOUTH AT BEDTIME 90 tablet 0  . propranolol ER (INDERAL LA) 120 MG 24 hr capsule TAKE 1 CAPSULE(120 MG) BY MOUTH DAILY 30 capsule 0   Current Facility-Administered Medications on File Prior to Visit  Medication Dose Route  Frequency Provider Last Rate Last Dose  . cefTRIAXone (ROCEPHIN) injection 1 g  1 g Intramuscular Q24H Lucille Passy, MD   1 g at 09/11/14 1044    Past Medical History  Diagnosis Date  . Glaucoma   . Clotting disorder   . DVT (deep venous thrombosis)     LEFT  . GERD (gastroesophageal reflux disease)   . Hypercholesterolemia   . Peripheral neuropathy   . Essential tremor   . Diverticulosis   . Arthritis     knees and back  . Cancer     Pancreatic  . Hypertension   . Colon polyps   . UTI (lower urinary tract infection)   . Complication of anesthesia     slow to wak up   .  Pulmonary embolism      Review of Systems  Constitutional: Negative for fever and chills.  Respiratory: Negative for cough, chest tightness, shortness of breath and wheezing.   Cardiovascular: Negative for palpitations and leg swelling.  Gastrointestinal: Positive for nausea and abdominal pain. Negative for vomiting, constipation and blood in stool.      Objective:    BP 112/84 mmHg  Pulse 81  Temp(Src) 98.2 F (36.8 C) (Oral)  Resp 18  Ht 5\' 9"  (1.753 m)  Wt 152 lb (68.947 kg)  BMI 22.44 kg/m2  SpO2 97% Nursing note and vital signs reviewed.  Physical Exam  Constitutional: She is oriented to person, place, and time. She appears well-developed. No distress.  Appears thinner than previous visits and slightly fatigued. Dressed appropriately for the situation and answers questions appropriately.  Cardiovascular: Normal rate, regular rhythm, normal heart sounds and intact distal pulses.   Pulmonary/Chest: Effort normal and breath sounds normal.  Abdominal: Soft. Bowel sounds are normal. She exhibits no distension and no mass. There is tenderness. There is no rebound and no guarding.  Neurological: She is alert and oriented to person, place, and time.  Skin: Skin is warm and dry.  Psychiatric: She has a normal mood and affect. Her behavior is normal. Judgment and thought content normal.       Assessment & Plan:   Problem List Items Addressed This Visit      Other   Reflux - Primary   Relevant Medications   pantoprazole (PROTONIX) 40 MG tablet   Generalized abdominal pain    Pain of undetermined origin possibly related to her pancreatic cancer or the urinary catheter, however notes the pain is similar to which she had prior to her surgery. Review of most recent abdominal MRI revealed questionable cysts that are being investigated by Oncology as possible return of her pancreatic cancer. New suprapubic catheter in place which may help with her pain. Discussed possibility of  cancer related pain and recommended evaluation by palliative care to help with current symptoms and overall comfort. Patient declines at this time. Discontinue hydrocodone-acetaminophen and start oxycodone-acetaminophen to determine if side effects still occur. One week prescription given in trial. Will continue to discuss with oncology and general surgery. This is a complicated case. Follow up pending further investigation by Oncology.       Relevant Medications   oxyCODONE-acetaminophen (PERCOCET) 7.5-325 MG per tablet

## 2014-10-20 NOTE — Progress Notes (Signed)
Pre visit review using our clinic review tool, if applicable. No additional management support is needed unless otherwise documented below in the visit note. 

## 2014-10-20 NOTE — Telephone Encounter (Signed)
I presented her case in our GI tumor Board this morning. The new soft tissue lesion in the peripancreatic region felt to be suspicious for recurrent pancreas cancer. She will recommend a tissue biopsy, either through EUS or IR percutaneous biopsy.  I spoke with patient's daughter, Bianca, she agrees with the biopsy, and prefer to have it done by Dr. Benson Norway through EUS. I will contact Dr. Benson Norway for the biopsy.  Truitt Merle  10/20/2014

## 2014-10-20 NOTE — Assessment & Plan Note (Signed)
Pain of undetermined origin possibly related to her pancreatic cancer or the urinary catheter, however notes the pain is similar to which she had prior to her surgery. Review of most recent abdominal MRI revealed questionable cysts that are being investigated by Oncology as possible return of her pancreatic cancer. New suprapubic catheter in place which may help with her pain. Discussed possibility of cancer related pain and recommended evaluation by palliative care to help with current symptoms and overall comfort. Patient declines at this time. Discontinue hydrocodone-acetaminophen and start oxycodone-acetaminophen to determine if side effects still occur. One week prescription given in trial. Will continue to discuss with oncology and general surgery. This is a complicated case. Follow up pending further investigation by Oncology.

## 2014-10-25 ENCOUNTER — Other Ambulatory Visit: Payer: Self-pay | Admitting: Urology

## 2014-10-25 DIAGNOSIS — N312 Flaccid neuropathic bladder, not elsewhere classified: Secondary | ICD-10-CM

## 2014-10-26 ENCOUNTER — Ambulatory Visit: Payer: Medicare Other

## 2014-10-27 ENCOUNTER — Telehealth: Payer: Self-pay | Admitting: *Deleted

## 2014-10-27 NOTE — Telephone Encounter (Signed)
Called patient to follow up on status of her appointment with Dr. Benson Norway. She is scheduled to see him on 11/01/14 at 3 pm. She reports his office did not seem to know why she was coming back. Faxed last office note and copy of email Dr. Burr Medico sent to Dr. Benson Norway to office for clarification.

## 2014-10-28 ENCOUNTER — Ambulatory Visit: Payer: Medicare Other

## 2014-10-28 ENCOUNTER — Telehealth: Payer: Self-pay

## 2014-10-28 NOTE — Telephone Encounter (Signed)
Spoke with daughter (pt was asleep due to having just taken pain meds as she is still in a lot of pain) and discussed possibility of putting pt on hold for now and cancelling upcoming appts as pt is in so much pain and very limited with what she can tolerate. Daughter reported that [name] needs to have another endoscopy as they are now worried about a recurrence as she has been having so much pain. Has an appt with her GI doctor on Monday to further discuss this. [name] will speak to her mom about whether she wants to cont PT at this point or be on hold and they will let us know.

## 2014-10-29 ENCOUNTER — Telehealth: Payer: Self-pay | Admitting: Family

## 2014-10-29 DIAGNOSIS — R1084 Generalized abdominal pain: Secondary | ICD-10-CM

## 2014-10-29 DIAGNOSIS — C252 Malignant neoplasm of tail of pancreas: Secondary | ICD-10-CM

## 2014-10-29 MED ORDER — OXYCODONE-ACETAMINOPHEN 7.5-325 MG PO TABS: 1.0000 | ORAL_TABLET | Freq: Four times a day (QID) | ORAL | Status: DC | PRN

## 2014-10-29 NOTE — Telephone Encounter (Signed)
Let pt know

## 2014-10-29 NOTE — Telephone Encounter (Signed)
Medication available for pick up. 

## 2014-10-29 NOTE — Telephone Encounter (Signed)
Patient said PCP told her to let him know if the percocet he prescribed worked and he would write her another prescription.  She wanted to let you know that the meds are working and she will need another prescription.

## 2014-11-01 ENCOUNTER — Other Ambulatory Visit: Payer: Self-pay | Admitting: Gastroenterology

## 2014-11-01 ENCOUNTER — Other Ambulatory Visit: Payer: Self-pay | Admitting: Physician Assistant

## 2014-11-02 ENCOUNTER — Ambulatory Visit: Payer: Medicare Other

## 2014-11-03 ENCOUNTER — Other Ambulatory Visit: Payer: Self-pay

## 2014-11-03 ENCOUNTER — Other Ambulatory Visit: Payer: Self-pay | Admitting: Family

## 2014-11-03 MED ORDER — PROPRANOLOL HCL ER 120 MG PO CP24: ORAL_CAPSULE | ORAL | Status: DC

## 2014-11-04 ENCOUNTER — Encounter (HOSPITAL_COMMUNITY): Payer: Self-pay | Admitting: *Deleted

## 2014-11-04 ENCOUNTER — Ambulatory Visit: Payer: Medicare Other | Admitting: Physical Therapy

## 2014-11-04 NOTE — Anesthesia Preprocedure Evaluation (Addendum)
Anesthesia Evaluation  Patient identified by MRN, date of birth, ID band Patient awake    Reviewed: Allergy & Precautions, NPO status , Patient's Chart, lab work & pertinent test results  History of Anesthesia Complications Negative for: history of anesthetic complications  Airway Mallampati: II  TM Distance: >3 FB Neck ROM: Full    Dental no notable dental hx. (+) Poor Dentition, Missing, Loose,    Pulmonary neg pulmonary ROS, Current Smoker,    Pulmonary exam normal breath sounds clear to auscultation       Cardiovascular hypertension, Pt. on medications and Pt. on home beta blockers Normal cardiovascular exam Rhythm:Regular Rate:Normal     Neuro/Psych Peripheral neuropathy, bilateral lower extremities, weakness and tingling and numbness  Neuromuscular disease negative psych ROS   GI/Hepatic Neg liver ROS, GERD  Medicated and Controlled,  Endo/Other  negative endocrine ROS  Renal/GU negative Renal ROS  negative genitourinary   Musculoskeletal  (+) Arthritis ,   Abdominal   Peds negative pediatric ROS (+)  Hematology negative hematology ROS (+)   Anesthesia Other Findings Hx of pancreatic cancer  Reproductive/Obstetrics negative OB ROS                            Anesthesia Physical Anesthesia Plan  ASA: III  Anesthesia Plan: MAC   Post-op Pain Management:    Induction: Intravenous  Airway Management Planned: Nasal Cannula  Additional Equipment:   Intra-op Plan:   Post-operative Plan:   Informed Consent: I have reviewed the patients History and Physical, chart, labs and discussed the procedure including the risks, benefits and alternatives for the proposed anesthesia with the patient or authorized representative who has indicated his/her understanding and acceptance.   Dental advisory given  Plan Discussed with: CRNA  Anesthesia Plan Comments:        Anesthesia  Quick Evaluation

## 2014-11-05 ENCOUNTER — Encounter (HOSPITAL_COMMUNITY): Payer: Self-pay

## 2014-11-05 ENCOUNTER — Encounter (HOSPITAL_COMMUNITY)
Admission: RE | Disposition: A | Discharge status: Home / Self Care | Payer: Self-pay | Source: Ambulatory Visit | Attending: Gastroenterology

## 2014-11-05 ENCOUNTER — Ambulatory Visit (HOSPITAL_COMMUNITY): Payer: Medicare Other | Admitting: Anesthesiology

## 2014-11-05 ENCOUNTER — Ambulatory Visit (HOSPITAL_COMMUNITY)
Admission: RE | Admit: 2014-11-05 | Discharge: 2014-11-05 | Disposition: A | Discharge status: Home / Self Care | Payer: Medicare Other | Source: Ambulatory Visit | Attending: Gastroenterology | Admitting: Gastroenterology

## 2014-11-05 DIAGNOSIS — Z79899 Other long term (current) drug therapy: Secondary | ICD-10-CM | POA: Insufficient documentation

## 2014-11-05 DIAGNOSIS — Z86010 Personal history of colonic polyps: Secondary | ICD-10-CM | POA: Diagnosis not present

## 2014-11-05 DIAGNOSIS — E7800 Pure hypercholesterolemia, unspecified: Secondary | ICD-10-CM | POA: Diagnosis not present

## 2014-11-05 DIAGNOSIS — G629 Polyneuropathy, unspecified: Secondary | ICD-10-CM | POA: Diagnosis not present

## 2014-11-05 DIAGNOSIS — Z86711 Personal history of pulmonary embolism: Secondary | ICD-10-CM | POA: Insufficient documentation

## 2014-11-05 DIAGNOSIS — I10 Essential (primary) hypertension: Secondary | ICD-10-CM | POA: Insufficient documentation

## 2014-11-05 DIAGNOSIS — Z7952 Long term (current) use of systemic steroids: Secondary | ICD-10-CM | POA: Insufficient documentation

## 2014-11-05 DIAGNOSIS — G709 Myoneural disorder, unspecified: Secondary | ICD-10-CM | POA: Insufficient documentation

## 2014-11-05 DIAGNOSIS — F17200 Nicotine dependence, unspecified, uncomplicated: Secondary | ICD-10-CM | POA: Insufficient documentation

## 2014-11-05 DIAGNOSIS — Z7901 Long term (current) use of anticoagulants: Secondary | ICD-10-CM | POA: Diagnosis not present

## 2014-11-05 DIAGNOSIS — M1990 Unspecified osteoarthritis, unspecified site: Secondary | ICD-10-CM | POA: Diagnosis not present

## 2014-11-05 DIAGNOSIS — G250 Essential tremor: Secondary | ICD-10-CM | POA: Diagnosis not present

## 2014-11-05 DIAGNOSIS — R109 Unspecified abdominal pain: Secondary | ICD-10-CM | POA: Diagnosis present

## 2014-11-05 DIAGNOSIS — H409 Unspecified glaucoma: Secondary | ICD-10-CM | POA: Diagnosis not present

## 2014-11-05 DIAGNOSIS — C252 Malignant neoplasm of tail of pancreas: Secondary | ICD-10-CM | POA: Insufficient documentation

## 2014-11-05 DIAGNOSIS — K219 Gastro-esophageal reflux disease without esophagitis: Secondary | ICD-10-CM | POA: Diagnosis not present

## 2014-11-05 HISTORY — PX: FINE NEEDLE ASPIRATION: SHX5430

## 2014-11-05 HISTORY — PX: EUS: SHX5427

## 2014-11-05 SURGERY — ULTRASOUND, UPPER GI TRACT, ENDOSCOPIC
Anesthesia: Monitor Anesthesia Care

## 2014-11-05 MED ORDER — EPHEDRINE SULFATE 50 MG/ML IJ SOLN
INTRAMUSCULAR | Status: AC
  Filled 2014-11-05: qty 1

## 2014-11-05 MED ORDER — SODIUM CHLORIDE 0.9 % IJ SOLN
INTRAMUSCULAR | Status: AC
  Filled 2014-11-05: qty 10

## 2014-11-05 MED ORDER — SODIUM CHLORIDE 0.9 % IV SOLN: INTRAVENOUS | Status: DC

## 2014-11-05 MED ORDER — PROPOFOL 10 MG/ML IV BOLUS
INTRAVENOUS | Status: AC
  Filled 2014-11-05: qty 20

## 2014-11-05 MED ORDER — METOPROLOL TARTRATE 1 MG/ML IV SOLN
INTRAVENOUS | Status: AC
  Filled 2014-11-05: qty 5

## 2014-11-05 MED ORDER — PHENYLEPHRINE HCL 10 MG/ML IJ SOLN
INTRAMUSCULAR | Status: DC | PRN
  Administered 2014-11-05: 80 ug via INTRAVENOUS

## 2014-11-05 MED ORDER — ONDANSETRON HCL 4 MG/2ML IJ SOLN
INTRAMUSCULAR | Status: AC
  Filled 2014-11-05: qty 2

## 2014-11-05 MED ORDER — PROPOFOL 500 MG/50ML IV EMUL
INTRAVENOUS | Status: DC | PRN
  Administered 2014-11-05: 40 mg via INTRAVENOUS

## 2014-11-05 MED ORDER — ONDANSETRON HCL 4 MG/2ML IJ SOLN
INTRAMUSCULAR | Status: DC | PRN
  Administered 2014-11-05: 4 mg via INTRAVENOUS

## 2014-11-05 MED ORDER — PROPOFOL 500 MG/50ML IV EMUL
INTRAVENOUS | Status: DC | PRN
  Administered 2014-11-05: 250 ug/kg/min via INTRAVENOUS

## 2014-11-05 MED ORDER — LACTATED RINGERS IV SOLN
INTRAVENOUS | Status: DC
  Administered 2014-11-05: 07:00:00 via INTRAVENOUS

## 2014-11-05 MED ORDER — ROCURONIUM BROMIDE 100 MG/10ML IV SOLN
INTRAVENOUS | Status: AC
  Filled 2014-11-05: qty 1

## 2014-11-05 NOTE — Interval H&P Note (Signed)
History and Physical Interval Note:  11/05/2014 7:38 AM  [name]  has presented today for surgery, with the diagnosis of pancreatic mass  The various methods of treatment have been discussed with the patient and family. After consideration of risks, benefits and other options for treatment, the patient has consented to  Procedure(s): FULL UPPER ENDOSCOPIC ULTRASOUND (EUS) RADIAL (N/A) FINE NEEDLE ASPIRATION (FNA) LINEAR (N/A) as a surgical intervention .  The patient's history has been reviewed, patient examined, no change in status, stable for surgery.  I have reviewed the patient's chart and labs.  Questions were answered to the patient's satisfaction.     Londyn Hotard D

## 2014-11-05 NOTE — Discharge Instructions (Signed)

## 2014-11-05 NOTE — Transfer of Care (Signed)
Immediate Anesthesia Transfer of Care Note  Patient: [name]  Procedure(s) Performed: Procedure(s): FULL UPPER ENDOSCOPIC ULTRASOUND (EUS) RADIAL (N/A) FINE NEEDLE ASPIRATION (FNA) LINEAR (N/A)  Patient Location: PACU  Anesthesia Type:MAC  Level of Consciousness:  sedated, patient cooperative and responds to stimulation  Airway & Oxygen Therapy:Patient Spontanous Breathing and Patient connected to face mask oxgen  Post-op Assessment:  Report given to PACU RN and Post -op Vital signs reviewed and stable  Post vital signs:  Reviewed and stable  Last Vitals:  Filed Vitals:   11/05/14 0630  BP: 137/83  Pulse: 80  Temp: 36.6 C  Resp: 15    Complications: No apparent anesthesia complications

## 2014-11-05 NOTE — Op Note (Signed)
[facility] [place], [address]   UPPER ENDOSCOPIC ULTRASOUND PROCEDURE REPORT     EXAM DATE: 11/05/2014  PATIENT NAME:          [name], [name]          MR#: [id number] BIRTHDATE:       01-Oct-1945     VISIT #:     [id number] ATTENDING:     Carol Ada, MD     STATUS:     outpatient ASSISTANT:      Jiles Harold and Sherle Poe MD:  Truitt Merle, M.D. ASA CLASS:        Class III  INDICATIONS:  The patient is a 69 yr old female here for a lower endoscopic ultrasound due to abnormal CT of the GI tract. PROCEDURE PERFORMED:     Upper Endoscopic Ultrasound with FNA   MEDICATIONS:     Monitored anesthesia care  CONSENT: The patient understands the risks and benefits of the procedure and understands that these risks include, but are not limited to: sedation, allergic reaction, infection, perforation and/or bleeding. Alternative means of evaluation and treatment include, among others: physical exam, x-rays, and/or surgical intervention. The patient elects to proceed with this endoscopic procedure.  DESCRIPTION OF PROCEDURE: During intra-op preparation period all mechanical & medical equipment was checked for proper function. Hand hygiene and appropriate measures for infection prevention was taken. After the risks, benefits and alternatives of the procedure were thoroughly explained, Informed consent was verified, confirmed and timeout was successfully executed by the treatment team. The patient was then placed in the left, lateral, decubitus position and IV sedation was administered. Throughout the procedure, the patients blood pressure, pulse and oxygen saturations were monitored continuously. Under direct visualization, the    was introduced through the mouth and advanced to the second portion of the duodenum.  Water was used as necessary to provide an acoustic interface. The pulse, BP, and O2 saturation were monitored  and documented by the physician and nursing staff throughout the procedure. Upon completion of the imaging, water was removed and the patient was then discharged to recovery in stable condition with the appropriate post procedure care. Estimated blood loss is zero unless otherwise noted in this procedure report.   FINDINGS: The remnant pancreas was identified and it appeared to be normal (Image [id number]), however, at the head/neck region of the pancreas a 2.3 cm  x 1.8 cm mass was identified (Image 002).  The lesion was hypoechoic nd midly irregularly bordered, but overall it appeared to be well-demarcated.  Three passes with the 22 gauge FNA needle was performed.  At the resected portion of the pancreas the area exhibited post surgical changes.  There is the possibility of a new mass lesion in this area (Image 005), but I was not completely certain as a result of the post surgical distortion.  No attempts with FNA were made in this site.  The biopsied lesion appears to be the cystic mass lesion reported on the CT scan.    ADVERSE EVENTS:     No immediate IMPRESSIONS:     1) Peripancreatic mass  RECOMMENDATIONS:     1) Await FNA results.  ___________________________________ Carol Ada, MD eSigned:  Carol Ada, MD 11/05/2014 8:42 AM   cc:      PATIENT NAME:  Jude, Linck MR#: [id number]

## 2014-11-05 NOTE — Anesthesia Postprocedure Evaluation (Signed)
  Anesthesia Post-op Note  Patient: [name]  Procedure(s) Performed: Procedure(s) (LRB): FULL UPPER ENDOSCOPIC ULTRASOUND (EUS) RADIAL (N/A) FINE NEEDLE ASPIRATION (FNA) LINEAR (N/A)  Patient Location: PACU  Anesthesia Type: MAC  Level of Consciousness: awake and alert   Airway and Oxygen Therapy: Patient Spontanous Breathing  Post-op Pain: mild  Post-op Assessment: Post-op Vital signs reviewed, Patient's Cardiovascular Status Stable, Respiratory Function Stable, Patent Airway and No signs of Nausea or vomiting  Last Vitals:  Filed Vitals:   11/05/14 0839  BP:   Pulse:   Temp: 36.5 C  Resp:     Post-op Vital Signs: stable   Complications: No apparent anesthesia complications

## 2014-11-05 NOTE — H&P (View-Only) (Signed)
Subjective:    Patient ID: [name], female    DOB: January 09, 1946, 69 y.o.   MRN: [id number]  Chief Complaint  Patient presents with  . Pain medication    Refill of protonix, Pt states that she has abdominal pain not back pain that is so bad that it goes to her back, hydrocodone works but when it wears off it gives her a bad headache and tramadol does not work    HPI:  [name] is a 69 y.o. female who  has a past medical history of Glaucoma; Clotting disorder; DVT (deep venous thrombosis); GERD (gastroesophageal reflux disease); Hypercholesterolemia; Peripheral neuropathy; Essential tremor; Diverticulosis; Arthritis; Cancer; Hypertension; Colon polyps; UTI (lower urinary tract infection); Complication of anesthesia; and Pulmonary embolism. and presents today for a follow up office visit.   1.) Abdominal pain - Associated symptom of pain located in her lower abdomen that has been going on since just before she had her pancreatic surgery. She has recently been seen by urology and a new suprapubic catheter has been placed in the last 24 hours as there was potential for the pain to be related to her catheter and multiple urinary tract infections. Describes the pain as constant and throbbing. It does not seem to go away. She was maintained on hydrocodone-acetaminophen and notes that when the medication wore off she would get headaches and nausea. Reports taking the medication twice every couple of days. Was recently prescribed tramadol during an office visit which she reports is ineffective. Questions what the next step for determining where the pain is coming from and treating it.  Allergies  Allergen Reactions  . Sulfa Antibiotics Swelling     Current Outpatient Prescriptions on File Prior to Visit  Medication Sig Dispense Refill  . atorvastatin (LIPITOR) 40 MG tablet Take 1 tablet (40 mg total) by mouth daily. 90 tablet 0  . ciprofloxacin (CIPRO) 500 MG tablet Take 500 mg by  mouth 2 (two) times daily.    . cromolyn (OPTICROM) 4 % ophthalmic solution Place 1 drop into both eyes 4 (four) times daily.     Marland Kitchen diltiazem (CARTIA XT) 120 MG 24 hr capsule Take 1 capsule (120 mg total) by mouth daily. 90 capsule 0  . DORZOLAMIDE HCL-TIMOLOL MAL OP Place 1 drop into the left eye 2 (two) times daily.     Marland Kitchen enoxaparin (LOVENOX) 100 MG/ML injection Inject 1 mL (100 mg total) into the skin daily. 30 Syringe 3  . ketorolac (ACULAR) 0.4 % SOLN Place 1 drop into the left eye 4 (four) times daily.     Marland Kitchen latanoprost (XALATAN) 0.005 % ophthalmic solution Place 1 drop into both eyes at bedtime.    . Multiple Vitamin (MULTIVITAMIN WITH MINERALS) TABS tablet Take 2 tablets by mouth daily.    Marland Kitchen ofloxacin (OCUFLOX) 0.3 % ophthalmic solution Place 1 drop into the left eye 4 (four) times daily.     . ondansetron (ZOFRAN-ODT) 4 MG disintegrating tablet DISSOLVE 1 TABLET(4 MG) ON THE TONGUE EVERY 8 HOURS AS NEEDED FOR NAUSEA OR VOMITING 20 tablet 0  . prednisoLONE acetate (PRED FORTE) 1 % ophthalmic suspension 1 drop 4 (four) times daily.    . primidone (MYSOLINE) 50 MG tablet TAKE 1 TABLET BY MOUTH AT BEDTIME 90 tablet 0  . propranolol ER (INDERAL LA) 120 MG 24 hr capsule TAKE 1 CAPSULE(120 MG) BY MOUTH DAILY 30 capsule 0   Current Facility-Administered Medications on File Prior to Visit  Medication Dose Route  Frequency Provider Last Rate Last Dose  . cefTRIAXone (ROCEPHIN) injection 1 g  1 g Intramuscular Q24H Lucille Passy, MD   1 g at 09/11/14 1044    Past Medical History  Diagnosis Date  . Glaucoma   . Clotting disorder   . DVT (deep venous thrombosis)     LEFT  . GERD (gastroesophageal reflux disease)   . Hypercholesterolemia   . Peripheral neuropathy   . Essential tremor   . Diverticulosis   . Arthritis     knees and back  . Cancer     Pancreatic  . Hypertension   . Colon polyps   . UTI (lower urinary tract infection)   . Complication of anesthesia     slow to wak up   .  Pulmonary embolism      Review of Systems  Constitutional: Negative for fever and chills.  Respiratory: Negative for cough, chest tightness, shortness of breath and wheezing.   Cardiovascular: Negative for palpitations and leg swelling.  Gastrointestinal: Positive for nausea and abdominal pain. Negative for vomiting, constipation and blood in stool.      Objective:    BP 112/84 mmHg  Pulse 81  Temp(Src) 98.2 F (36.8 C) (Oral)  Resp 18  Ht 5\' 9"  (1.753 m)  Wt 152 lb (68.947 kg)  BMI 22.44 kg/m2  SpO2 97% Nursing note and vital signs reviewed.  Physical Exam  Constitutional: She is oriented to person, place, and time. She appears well-developed. No distress.  Appears thinner than previous visits and slightly fatigued. Dressed appropriately for the situation and answers questions appropriately.  Cardiovascular: Normal rate, regular rhythm, normal heart sounds and intact distal pulses.   Pulmonary/Chest: Effort normal and breath sounds normal.  Abdominal: Soft. Bowel sounds are normal. She exhibits no distension and no mass. There is tenderness. There is no rebound and no guarding.  Neurological: She is alert and oriented to person, place, and time.  Skin: Skin is warm and dry.  Psychiatric: She has a normal mood and affect. Her behavior is normal. Judgment and thought content normal.       Assessment & Plan:   Problem List Items Addressed This Visit      Other   Reflux - Primary   Relevant Medications   pantoprazole (PROTONIX) 40 MG tablet   Generalized abdominal pain    Pain of undetermined origin possibly related to her pancreatic cancer or the urinary catheter, however notes the pain is similar to which she had prior to her surgery. Review of most recent abdominal MRI revealed questionable cysts that are being investigated by Oncology as possible return of her pancreatic cancer. New suprapubic catheter in place which may help with her pain. Discussed possibility of  cancer related pain and recommended evaluation by palliative care to help with current symptoms and overall comfort. Patient declines at this time. Discontinue hydrocodone-acetaminophen and start oxycodone-acetaminophen to determine if side effects still occur. One week prescription given in trial. Will continue to discuss with oncology and general surgery. This is a complicated case. Follow up pending further investigation by Oncology.       Relevant Medications   oxyCODONE-acetaminophen (PERCOCET) 7.5-325 MG per tablet

## 2014-11-08 ENCOUNTER — Encounter (HOSPITAL_COMMUNITY): Payer: Self-pay | Admitting: Gastroenterology

## 2014-11-12 ENCOUNTER — Telehealth: Payer: Self-pay | Admitting: Hematology

## 2014-11-12 ENCOUNTER — Other Ambulatory Visit: Payer: Self-pay | Admitting: Hematology

## 2014-11-12 NOTE — Telephone Encounter (Signed)
per of to sch pt appt-gave pt copy of avs °

## 2014-11-17 ENCOUNTER — Telehealth: Payer: Self-pay | Admitting: Hematology

## 2014-11-17 ENCOUNTER — Ambulatory Visit (HOSPITAL_BASED_OUTPATIENT_CLINIC_OR_DEPARTMENT_OTHER): Payer: Medicare Other

## 2014-11-17 ENCOUNTER — Encounter: Payer: Self-pay | Admitting: Hematology

## 2014-11-17 ENCOUNTER — Ambulatory Visit (HOSPITAL_BASED_OUTPATIENT_CLINIC_OR_DEPARTMENT_OTHER): Payer: Medicare Other | Admitting: Hematology

## 2014-11-17 VITALS — BP 110/66 | HR 94 | Temp 98.0°F | Resp 18 | Ht 69.5 in | Wt 147.6 lb

## 2014-11-17 DIAGNOSIS — I82403 Acute embolism and thrombosis of unspecified deep veins of lower extremity, bilateral: Secondary | ICD-10-CM | POA: Diagnosis not present

## 2014-11-17 DIAGNOSIS — R634 Abnormal weight loss: Secondary | ICD-10-CM | POA: Diagnosis not present

## 2014-11-17 DIAGNOSIS — G893 Neoplasm related pain (acute) (chronic): Secondary | ICD-10-CM | POA: Diagnosis not present

## 2014-11-17 DIAGNOSIS — C252 Malignant neoplasm of tail of pancreas: Secondary | ICD-10-CM | POA: Diagnosis present

## 2014-11-17 DIAGNOSIS — R630 Anorexia: Secondary | ICD-10-CM | POA: Diagnosis not present

## 2014-11-17 LAB — CBC WITH DIFFERENTIAL/PLATELET
BASO%: 0.9 % (ref 0.0–2.0)
Basophils Absolute: 0.1 10*3/uL (ref 0.0–0.1)
EOS%: 2.2 % (ref 0.0–7.0)
Eosinophils Absolute: 0.2 10*3/uL (ref 0.0–0.5)
HEMATOCRIT: 46.1 % (ref 34.8–46.6)
HEMOGLOBIN: 15.5 g/dL (ref 11.6–15.9)
LYMPH#: 1.7 10*3/uL (ref 0.9–3.3)
LYMPH%: 22.2 % (ref 14.0–49.7)
MCH: 31.3 pg (ref 25.1–34.0)
MCHC: 33.6 g/dL (ref 31.5–36.0)
MCV: 93.2 fL (ref 79.5–101.0)
MONO#: 0.5 10*3/uL (ref 0.1–0.9)
MONO%: 6.7 % (ref 0.0–14.0)
NEUT#: 5.1 10*3/uL (ref 1.5–6.5)
NEUT%: 68 % (ref 38.4–76.8)
PLATELETS: 212 10*3/uL (ref 145–400)
RBC: 4.95 10*6/uL (ref 3.70–5.45)
RDW: 15.2 % — AB (ref 11.2–14.5)
WBC: 7.5 10*3/uL (ref 3.9–10.3)

## 2014-11-17 LAB — COMPREHENSIVE METABOLIC PANEL (CC13)
ALBUMIN: 3.2 g/dL — AB (ref 3.5–5.0)
ALK PHOS: 65 U/L (ref 40–150)
ALT: 18 U/L (ref 0–55)
ANION GAP: 8 meq/L (ref 3–11)
AST: 43 U/L — AB (ref 5–34)
BILIRUBIN TOTAL: 0.4 mg/dL (ref 0.20–1.20)
BUN: 8.7 mg/dL (ref 7.0–26.0)
CALCIUM: 10.1 mg/dL (ref 8.4–10.4)
CO2: 28 mEq/L (ref 22–29)
CREATININE: 0.8 mg/dL (ref 0.6–1.1)
Chloride: 104 mEq/L (ref 98–109)
EGFR: >90 mL/min/{1.73_m2} (ref >90)
Glucose: 114 mg/dl (ref 70–140)
Potassium: 4 mEq/L (ref 3.5–5.1)
Sodium: 140 mEq/L (ref 136–145)
TOTAL PROTEIN: 7 g/dL (ref 6.4–8.3)

## 2014-11-17 MED ORDER — OXYCODONE HCL ER 15 MG PO T12A: 15.0000 mg | EXTENDED_RELEASE_TABLET | Freq: Two times a day (BID) | ORAL | Status: DC

## 2014-11-17 NOTE — Progress Notes (Addendum)
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 CT chest was negative.    02/26/2014 Pathology Results FNA of pancreatic mass showed adenocarcinoma    03/22/2014 Initial Diagnosis Pancreatic adenocarcinoma   04/08/2014 Surgery robotic converted to hand-assisted laparoscopic distal pancreatectomy, splenectomy, partial colectomy, and partial removal of the left renal capsule for pancreatic adenocarcinoma. Surgical margins were negative. pT3N0   04/08/2014 - 05/04/2014 Hospital Admission admitted for pancreatic surgery, complicated with abdominal abcess, which was drained and treated with abx for pseudomonas infection, difficulty with eating and ambulatory, malnutrition, was discharged to rehab     04/08/2014 Pathology Results pT3N0, moderately differentiated invasive adenocarcinoma, spanning 4.5 cm, invading colonic submucosa. Lymphovascular invasion is identified, perineural invasion is identified. 10 lymph nodes were negative. Surgical margins were negative.   05/09/2014 - 05/21/2014 Hospital Admission Pt was admited for chest and abdominal pain, found to have left side pleural effusion. thoracentesis  done by IR with 538ml fluids removed, cytology (-) malignant cells   10/02/2014 Imaging CT CAP showed a two new soft tissue versus cystic structures in the peripancreatic region, decresased left pleural effusion    11/05/2014 Pathology Results EGD/EUS needle biopsy of the soft tissue in the peripancreatic region showed adenocarcinoma     HISTORY OF PRESENTING ILLNESS:  [name] 69 y.o. female is here because of her recurrent DVT and incidental discovered pancreatic mass.  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 She had IVC filter placed back then and put on coumadin. She had second episode of right leg DVT when she was on coumadin in 2009, and continued coumadin until one year ago when she had trouble for monitoring her INR. She was then switched to Aspirin 81mg  dialy. 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 CT abdomen was ordered to further evaluate the DVT and abdominal pain on 12/22/2013, which showed thrombus in IVC and right common iliac vein below IVC. CT scan also showed a 2.8 x 2.5 cm mass within the tail of pancreas, this was confirmed by the MRI abdomen, with probably encasement of the splenic vessels. No significant abdominal adenopathy. CT of the chest was negative for distant metastasis. She was referred to see GI Dr.Hung and will have EGD/EUS for pancreatic mass biopsy in a week.   She has been having abdominal pain after eating in the pat few month, along with diarrhea and constipation, which has been chronic since her cholecystectomy in 199. Her appetite  is fare, and weight is stable. She also has tremor since teenager, and got worse lately, along with gait instaedyness, was seen by neurologist Dr. Carles Collet. She was put on primidone  which has help quite bit.   She had both knee surgery and was using a crane until recently when she was diagnosed with DVT and she has been using a walker. She has been having some gait instability lately, she was referred by Dr. Carles Collet and MRI of brain showed mild chronic small vessel ischemia disease and cerebral atrophy.  INTERIM HISTORY [name] returns for follow-up with her daughter. She underwent EGD and EUS, biopsy of the pancreatic soft tissue by Dr. Benson Norway on 11/05/2014. She tolerated the procedure well. She is here to discuss the biopsy results. She still has persistent moderate to severe upper abdominal pain, she takes Percocet every 6 hours, which helps, but not enough. She is not able to do much activity due to the abdominal pain. Appetite is low, she eats small meal. No other new complaints.  MEDICAL HISTORY:  Past Medical History  Diagnosis Date  . Glaucoma   . Clotting disorder ([place])   . DVT (deep venous thrombosis) (HCC)     LEFT  . GERD (gastroesophageal reflux disease)   . Hypercholesterolemia   . Essential tremor   . Diverticulosis   . Arthritis     knees and back  . Hypertension   . Colon polyps   . UTI (lower urinary tract infection)   . Pulmonary embolism ([place])     5 yrs ago. Lovenox started about 1 yr ago.  . Complication of anesthesia     slow to wake up   . Peripheral neuropathy (HCC)     feet bilaterally  . Cancer [facility])     Pancreatic- Surgery 3'16- Dr. Purcell Mouton)- no other tx.. Dr. Hena Ewalt([facility]).    SURGICAL HISTORY: Past Surgical History  Procedure Laterality Date  . Cholecystectomy    . Abdominal hysterectomy    . Knee surgery Bilateral     scope  . [place] filter    . Back surgery    . Eus N/A 02/26/2014    Procedure: UPPER ENDOSCOPIC ULTRASOUND (EUS) LINEAR;  Surgeon: Beryle Beams, MD;  Location: [facility] ENDOS[facility];  Service: Endoscopy;  Laterality: N/A;  . Fine needle aspiration N/A 02/26/2014    Procedure: FINE NEEDLE ASPIRATION (FNA) LINEAR;   Surgeon: Beryle Beams, MD;  Location: [facility] ENDOS[facility];  Service: Endoscopy;  Laterality: N/A;  . Breast lumpectomy Right   . Spine surgery    . Xi robotic assisted laparoscopic distal pancreatectomy N/A 04/08/2014    Procedure: XI ROBOTIC ASSISTED CONVERTED LAPAROSCOPIC HAND ASSITED DISTAL PANCREATECTOMY/SPLENECTOMY WITH PARTIAL COLECTOMY;  Surgeon: [name], MD;  Location: [facility];  Service: General;  Laterality: N/A;  . Colon surgery    . Diagnostic laparoscopy    . Cataract extraction      left eye  . Eus N/A 11/05/2014    Procedure: FULL UPPER ENDOSCOPIC ULTRASOUND (EUS) RADIAL;  Surgeon: Carol Ada, MD;  Location: [facility];  Service: Endoscopy;  Laterality: N/A;  . Fine needle aspiration N/A 11/05/2014    Procedure: FINE NEEDLE ASPIRATION (FNA) LINEAR;  Surgeon: Carol Ada, MD;  Location: [facility];  Service: Endoscopy;  Laterality: N/A;    SOCIAL HISTORY: History   Social History  . Marital Status: Divorced    Spouse Name: N/A    Number of Children: 3  . Years of Education: N/A   Occupational History  She used to work for [employer], retired now    Social History Main Topics  . Smoking status: Current Every Day Smoker -- 0.50 packs/day, for 40 years     Types: Cigarettes  . Smokeless tobacco: Not on file  . Alcohol Use: 0.0 oz/week, social drinker     0 Not specified per week     Comment: occ- glass of wine a week   . Drug Use: No  . Sexual Activity: Not on file   Other Topics Concern  . Not on file   Social History Narrative   Divorced, 3 children   Right handed   Associates degree   3 per week    FAMILY HISTORY: Family History  Problem Relation Age of Onset  . Diabetes Father 32  . Diabetes Maternal Grandmother   . Hypertension Mother   . Glaucoma Sister   . Cataracts Maternal Grandmother   . Cancer Sister 62    Breast    ALLERGIES:  is allergic to sulfa antibiotics.  MEDICATIONS:  Current Outpatient Prescriptions    Medication Sig Dispense Refill  . atorvastatin (LIPITOR) 40 MG tablet Take 1 tablet (40 mg total) by mouth daily. 90 tablet 0  . atropine 1 % ophthalmic solution Place 1 drop into the left eye 2 (two) times daily.    . cromolyn (OPTICROM) 4 % ophthalmic solution Place 1 drop into both eyes 4 (four) times daily.     Marland Kitchen diltiazem (CARTIA XT) 120 MG 24 hr capsule Take 1 capsule (120 mg total) by mouth daily. 90 capsule 0  . DORZOLAMIDE HCL-TIMOLOL MAL OP Place 1 drop into the left eye 2 (two) times daily.     Marland Kitchen enoxaparin (LOVENOX) 100 MG/ML injection Inject 1 mL (100 mg total) into the skin daily. 30 Syringe 3  . latanoprost (XALATAN) 0.005 % ophthalmic solution Place 1 drop into the right eye at bedtime.     . Multiple Vitamin (MULTIVITAMIN WITH MINERALS) TABS tablet Take 2 tablets by mouth daily.    . ondansetron (ZOFRAN-ODT) 4 MG disintegrating tablet DISSOLVE 1 TABLET(4 MG) ON THE TONGUE EVERY 8 HOURS AS NEEDED FOR NAUSEA OR VOMITING 20 tablet 0  . oxyCODONE-acetaminophen (PERCOCET) 7.5-325 MG tablet Take 1 tablet by mouth every 6 (six) hours as needed for severe pain. 120 tablet 0  . pantoprazole (PROTONIX) 40 MG tablet Take 1 tablet (40 mg total) by mouth 2 (two) times daily before a meal. 90 tablet 3  . prednisoLONE acetate (PRED FORTE) 1 % ophthalmic suspension Place 1 drop into the left eye 4 (four) times daily.     . primidone (MYSOLINE) 50 MG tablet TAKE 1 TABLET BY MOUTH AT BEDTIME 90 tablet 0  . propranolol ER (INDERAL LA) 120 MG 24 hr capsule TAKE 1 CAPSULE(120 MG) BY MOUTH DAILY 30 capsule 0  . OxyCODONE (OXYCONTIN) 15 mg T12A 12 hr tablet Take 1 tablet (15 mg total) by mouth every 12 (twelve) hours. 60 tablet 0   Current Facility-Administered Medications  Medication Dose Route Frequency Provider Last Rate Last Dose  . cefTRIAXone (ROCEPHIN) injection 1 g  1 g Intramuscular Q24H Lucille Passy, MD   1 g at 09/11/14 1044    REVIEW OF SYSTEMS:   Constitutional: Denies fevers, chills  or abnormal night sweats, (+) fatigue  Eyes: Denies blurriness of vision, double vision or watery eyes Ears, nose, mouth, throat, and face: Denies mucositis or sore throat Respiratory: Denies cough, dyspnea or wheezes Cardiovascular: Denies palpitation,  chest discomfort or lower extremity swelling Gastrointestinal:  Denies nausea, heartburn or change in bowel habits. (+) intermittent abdominal pain  Skin: Denies abnormal skin rashes Lymphatics: Denies new lymphadenopathy or easy bruising Neurological:Denies numbness, tingling or new weaknesses Behavioral/Psych: Mood is stable, no new changes  Extremities: Bilateral lower extremity edema and leg pain, left more than right. All other systems were reviewed with the patient and are negative.  PHYSICAL EXAMINATION: ECOG PERFORMANCE STATUS: 3 - Symptomatic, >50% confined to bed  Filed Vitals:   11/17/14 0928  BP: 110/66  Pulse: 94  Temp: 98 F (36.7 C)  Resp: 18   Filed Weights   11/17/14 0918 11/17/14 0928  Weight: 147 lb 9.6 oz (66.951 kg) 147 lb 9.6 oz (66.951 kg)    GENERAL:alert, no distress and comfortable, sitting in wheelchair.  SKIN: skin color, texture, turgor are normal, no rashes. She has some bruises on her abdomen at the Lovenox injection site. She also has a 2 cm subcutaneous nodule on the right flank area, likely a hematoma. EYES: normal, conjunctiva are pink and non-injected, sclera clear OROPHARYNX:no exudate, no erythema and lips, buccal mucosa, and tongue normal  NECK: supple, thyroid normal size, non-tender, without nodularity LYMPH:  no palpable lymphadenopathy in the cervical, axillary or inguinal LUNGS: clear to auscultation and percussion with normal breathing effort HEART: regular rate & rhythm and no murmurs and no lower extremity edema ABDOMEN:abdomen soft, mild tenderness at mid and low abdomen, normal bowel sounds Musculoskeletal:no cyanosis of digits and no clubbing  PSYCH: alert & oriented x 3 with  fluent speech NEURO: no focal motor/sensory deficits Ext: (+) Bilateral Ankle swelling  LABORATORY DATA:  I have reviewed the data as listed Lab Results  Component Value Date   WBC 7.5 11/17/2014   HGB 15.5 11/17/2014   HCT 46.1 11/17/2014   MCV 93.2 11/17/2014   PLT 212 11/17/2014   CMP Latest Ref Rng 11/17/2014 10/02/2014 06/30/2014  Glucose 70 - 140 mg/dl 114 111(H) 141(H)  BUN 7.0 - 26.0 mg/dL 8.7 7 4.6(L)  Creatinine 0.6 - 1.1 mg/dL 0.8 0.76 0.8  Sodium 136 - 145 mEq/L 140 136 142  Potassium 3.5 - 5.1 mEq/L 4.0 2.8(L) 3.1(L)  Chloride 101 - 111 mmol/L - 96(L) -  CO2 22 - 29 mEq/L 28 26 24   Calcium 8.4 - 10.4 mg/dL 10.1 9.4 8.8  Total Protein 6.4 - 8.3 g/dL 7.0 7.6 7.8  Total Bilirubin 0.20 - 1.20 mg/dL 0.40 0.7 0.26  Alkaline Phos 40 - 150 U/L 65 62 107  AST 5 - 34 U/L 43(H) 68(H) 36(H)  ALT 0 - 55 U/L 18 21 14      PATHOLOGY REPORT 04/08/2014 REASON FOR ADDENDUM, AMENDMENT OR CORRECTION: [id number]: AMENDMENT: Amendment issued to report additional gross information in part #2. ds 04/14/14 04:55:45 PM FINAL DIAGNOSIS Diagnosis 04/08/2014 1. Colon, segmental resection, transverse - PANCREATIC ADENOCARCINOMA INVADING COLONIC SUBMUCOSA. - LYMPHOVASCULAR INVASION IS IDENTIFIED. - THE COLONIC RESECTION MARGINS ARE NEGATIVE FOR ADENOCARCINOMA. 2. Pancreas, resection - INVASIVE ADENOCARCINOMA, MODERATELY DIFFERENTIATED, SPANNING 4.5 CM. - LYMPHOVASCULAR INVASION IS IDENTIFIED. - PERINEURAL INVASION IS IDENTIFIED. - ADENOCARCINOMA IS BROADLY PRESENT AT T 10 mg 1 negative. Surgical margins were negative. HE CLINICAL COLONIC MARGIN OF SPECIMEN #2. - ADENOCARCINOMA IS ADHERENT TO THE RENAL CAPSULE, BUT DOES NOT INVOLVE RENAL PARENCHYMA. - ADENOCARCINOMA IS ADHERENT TO THE SPLENIC CAPSULE, BUT DOES NOT INVOLVE SPLENIC PARENCHYMA. - THE PANCREATIC PARENCHYMAL RESECTION MARGIN IS NEGATIVE FOR ADENOCARCINOMA. - THERE IS NO EVIDENCE OF CARCINOMA IN 10  OF 10 LYMPH NODES (0/10). -  SEE ONCOLOGY TABLE BELOW. Microscopic Comment 2. PANCREAS (EXOCRINE): Procedure: Distal pancreatectomy with adherent renal capsule and portion of spleen and transverse colon resection. Tumor Site: Distal pancreatic tail. Tumor Size: 4.5 cm (gross measurement). Histologic Type: Adenocarcinoma. Histologic Grade (ductal carcinoma only): G2: Moderately differentiated Microscopic Tumor Extension: Adenocarcinoma involves colonic submucosa, renal capsule, and splenic capsule. Margins: The pancreatic parenchymal margin is negative for adenocarcinoma. Distance of invasive carcinoma from closest margin: 5.2 cm to the pancreatic parenchymal margin, see comment. Carcinoma in situ / high-grade dysplasia: Not identified. Treatment Effect: N/A. 1 of 3 Amended copy Amended FINAL for Chaddock, Dejah A ([id number]) Microscopic Comment(continued) Lymph-Vascular Invasion: Present, diffuse Perineural Invasion: Present, diffuse Lymph nodes: number examined 10; number positive: 0. Pathologic Staging: pT3, pN0. Additional Pathologic Findings: No significant findings. Ancillary Studies: N/A. Comment(s): There is a primary 4.5 cm moderately differentiated pancreatic adenocarcinoma present in the tail of the pancreas. There is direct extension of this tumor to the submucosa of the transverse colon (specimen #1). In addition, adenocarcinoma extends to and involves the renal capsule and splenic capsule. Despite four lymph nodes being negative for parenchymal metastases, one of the four lymph nodes does show perinodal deposits of adenocarcinoma. The adenocarcinoma present in the submucosa of the transverse colon, renal capsule, splenic capsule, and perinodal soft tissue is interpreted to represent direct extension of the primary tumor, rather than metastatic sites and thus, the tumor is staged as above.  Comment There are malignant cells with prominent nucleili and intracytoplasmic vacuoles, arranged in acinar  pattern. The overall morphologic findings favor a diagnosis of well differentiated adenocarcinoma.   Diagnosis 11/05/2014 FINE NEEDLE ASPIRATION, ENDOSCOPIC PANCREATIC TAIL (SPECIMEN 1 OF 1, COLLECTED ON 11/05/14): MALIGNANT CELLS PRESENT SEE COMMENT COMMENT: THE MALIGNANT CELLS ARE CONSISTENT WITH ADENOCARCINOMA. THE CASE WAS REVIEWED BY DR Tresa Moore, WHO CONCURS.   RADIOGRAPHIC STUDIES: I have personally reviewed the radiological images as listed and agreed with the findings in the report.  Ct Abdomen Pelvis W Contrast 10/02/2014    IMPRESSION: Foley catheter within thick-walled bladder, which may indicate infection/cystitis.  Two new soft tissue versus cystic structures in the peripancreatic region since 06/15/2014. Although these may represent pseudocysts, recurrent neoplasm is not entirely excluded and close interval follow-up or further evaluation is recommended.  Decreasing left pleural effusion which now appears slightly loculated. CT chest 10/07/2014 IMPRESSION: 1. Stable small left pleural effusion with associated passive atelectasis. No new lung nodule or other significant new findings in the chest. Stable small left internal mammary node warrants observation.   ASSESSMENT & PLAN:  69 year old female, with past medical history of recurrent bilateral lower extremity DVTs, presented with acute extensive left lower extremity DVT and thrombus in the IVC and the right, iliac vein, and chronic DVT in the right popliteal vein. She was also found to have a pancreatic tail mass on the CT scan.  1. Pancreatic adenocarcinoma, pT3 N0 M0, stage IIA, local recurrence in 09/2014  -I reviewed her surgical pathology result with her and her daughter.  her surgical margin was negative.  -I discussed the role of adjuvant chemotherapy after her surgery to reduce the risk of cancer recurrence after surgery. We usually recommend single agent gemcitabine as adjuvant therapy, however she had  multiple complications after surgery, and was not a candidate for adjuvant chemo - her restaging CT scan from 10/02/2014 showed two small soft tissue vs cystic lesion in pancrease. -I discussed her biopsy results with patient and her daughter in  details. Unfortunately she has local recurrence I'll check with her surgeon Dr. Barry Dienes, but unlikely she will offer more surgery.  -We reviewed her case in our tumor board. Unfortunately this is likely incurable disease.  -I'll refer her to radiation oncologist Dr. Lisbeth Renshaw to consider palliative radiation for pain control and disease control.  -I discussed the option of systemic chemotherapy. Due to her limited performance status, the only option is single agent gemcitabine, which has 10% response rate, but it would likely help her with her pain and quality of life.  We also discussed the option of a celiac block if her pain is not controlled medically.   2. Abdominal pain due to local recurrent pancreatic cancer -She is on Percocet every 6 hours, and her pain is not well controlled -I recommend her to start is long-acting pain medication, I given a prescription of OxyContin 50 mg every 12 hours -I'll see her back in 2 weeks to see if her pain medication needs to be adjusted.  3. Recurrent DVT  -I reviewed her lab results of prothrombin gene mutation, factor V Leyden mutation, and antithrombin III, which were all negative.  -Giving her recurrent unprovoked DVT, I recommend anticoagulation indefinitely. -she has some pain at injection sites, we discussed other A/C options, such as coumadin and xarelto, due to the cost issue, she wants to remain on lovenox   -continue lovenox 100 mg once daily. Her renal function is normal  4. Anorexia, weight loss  -I encouraged her to take nutrition supplement  -Follow up with dietitian   Plan: -add OxyContin 15 mg twice daily, I given her a prescription today -rad/onc referral to Dr. Lisbeth Renshaw -I'll see her back in 2  weeks   All questions were answered. The patient knows to call the clinic with any problems, questions or concerns. I spent 30 minutes counseling the patient face to face. The total time spent in the appointment was 40 minutes and more than 50% was on counseling.     Truitt Merle, MD 11/17/2014   9:43 AM

## 2014-11-17 NOTE — Telephone Encounter (Signed)
Gave and printed appt sched and avs for pt for OCT NOV

## 2014-11-18 ENCOUNTER — Ambulatory Visit: Payer: Medicare Other | Admitting: Nutrition

## 2014-11-18 NOTE — Progress Notes (Signed)
69 year old female diagnosed with pancreas cancer.  She is a patient of Dr. Burr Medico.  Past medical history includes recurrent DVT, GERD, hypercholesterolemia, diverticulosis, hypertension, UTI and PE.  Medications include Lipitor, multivitamin, Zofran, and Protonix.  Labs include albumin 3.2 on October 19  Height: 69.5 inches. Weight: 147.6 pounds Usual body weight: 216 pounds January 2016. BMI: 21.49.  Patient reports increased abdominal pain. She is working with her physician to improve pain management. She does occasionally have constipation but is taking stool softeners. Eats 2 meals a day.  Patient enjoys most protein foods. Patient reports an intolerance to oral nutrition supplements in the past.  Nutrition diagnosis: Severe malnutrition related to pancreas cancer as evidenced by 32% weight loss in 10 months with severe depletion of muscle and fat stores.  Intervention:  Educated patient on strategies for increasing frequency of meals and snacks. Educated patient on increased calories and protein and provided a fact sheet Encouraged patient to take pain medications as prescribed by M.D. Encouraged increased fluids that have additional calories and provided some different samples of oral nutrition supplements for patient to try. Questions were answered and teach back method was used.  Monitoring, evaluation, goals: Patient will work to increase calories and protein to minimize further weight loss.  Next visit: Patient has my contact information for questions or concerns.  **Disclaimer: This note was dictated with voice recognition software. Similar sounding words can inadvertently be transcribed and this note may contain transcription errors which may not have been corrected upon publication of note.**

## 2014-11-24 NOTE — Progress Notes (Signed)
GI Location of Tumor / Histology: Pancreas 2.5cm mass in the tail of pancreas  [name] presented months ago with symptoms of: pain & Swelling,  in left leg, went to ED , hx  Recurrent DVT, Pancreatic mass found on CT  Diagnosis 02/26/14 Initial bx:Dr. Hung,[place],MD FINE NEEDLE ASPIRATION, ENDOSCOPIC, PANCREAS TAIL(SPECIMEN 1 OF 1 COLLECTED 02/26/14): MALIGNANT CELLS PRESENT, SEE COMMENT.  Biopsies of  (if applicable) revealed: Diagnosis 11/05/14 : Dr. Derenda Mis FINE NEEDLE ASPIRATION, ENDOSCOPIC PANCREATIC TAIL (SPECIMEN 1 OF 1, COLLECTED ON 11/05/14): MALIGNANT CELLS PRESENT SEE COMMENT COMMENT: THE MALIGNANT CELLS ARE CONSISTENT WITH ADENOCARCINOMA.    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 [name], MD, 11/05/14 EUS fine needle aspiration Dr. Rosanne Ashing Whipple surgery 2016, many abdominal surgeries, IVC filter    Past/Anticipated interventions by medical oncology, if any: Dr. Burr Medico seen 11/18/14   Weight changes, if any: appetite poor   Bowel/Bladder complaints, if any: Chronic foley catheter placement of supra pubic catheter, Atonic neurogenic bladder,Diverticulosis ,  Nausea / Vomiting, if any:  Yes, Zofran odt helps siome   Pain issues, if any:  Upper abdominal pain, 8/10 scale, took Oxycodone this am,  Has percocet for prn breakthrough took percocet at 4am   Any blood per rectum:   NO  SAFETY ISSUES:Yes, using walker and cane, unsteady gait,  S/p knee surgery years ago, very unstedy  Prior radiation? NO  Pacemaker/ICD? NO  Possible current pregnancy? NO  Is the patient on methotrexate? NO   Details, c/o:   Divorced, 3 children,  Hx right breast Lumpectomy    Benign, cyst in her early 6's, ,  Recurrent DVT,Clotting disorder([facility]) PE, Back surgery, Anorexia weight loss  Sister breast cancer,   Surgery ,chemo and radiation(Yvonne jones) living  BP  106/81 mmHg  Pulse 88  Temp(Src) 98.1 F (36.7 C) (Oral)  Resp 20  Ht 5' 9.5" (1.765 m)  Wt 142 lb 9.6 oz (64.683 kg)  BMI 20.76 kg/m2  SpO2 96%  Wt Readings from Last 3 Encounters:  11/29/14 142 lb 9.6 oz (64.683 kg)  11/17/14 147 lb 9.6 oz (66.951 kg)  11/05/14 152 lb (68.947 kg)

## 2014-11-25 ENCOUNTER — Encounter: Payer: Self-pay | Admitting: Radiation Oncology

## 2014-11-29 ENCOUNTER — Ambulatory Visit
Admission: RE | Admit: 2014-11-29 | Discharge: 2014-11-29 | Disposition: A | Discharge status: Home / Self Care | Payer: Medicare Other | Source: Ambulatory Visit | Attending: Radiation Oncology | Admitting: Radiation Oncology

## 2014-11-29 ENCOUNTER — Encounter: Payer: Self-pay | Admitting: Radiation Oncology

## 2014-11-29 VITALS — BP 106/81 | HR 88 | Temp 98.1°F | Resp 20 | Ht 69.5 in | Wt 142.6 lb

## 2014-11-29 DIAGNOSIS — C252 Malignant neoplasm of tail of pancreas: Secondary | ICD-10-CM | POA: Diagnosis present

## 2014-11-29 DIAGNOSIS — Z90410 Acquired total absence of pancreas: Secondary | ICD-10-CM | POA: Insufficient documentation

## 2014-11-29 DIAGNOSIS — Z9081 Acquired absence of spleen: Secondary | ICD-10-CM | POA: Diagnosis not present

## 2014-11-29 DIAGNOSIS — Z510 Encounter for antineoplastic radiation therapy: Secondary | ICD-10-CM | POA: Diagnosis not present

## 2014-11-29 DIAGNOSIS — Z9049 Acquired absence of other specified parts of digestive tract: Secondary | ICD-10-CM | POA: Diagnosis not present

## 2014-11-29 NOTE — Progress Notes (Signed)
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   HISTORY OF PRESENT ILLNESS::[name] is a 69 y.o. female who is seen for an initial consultation visit.  Pt has a history of pancreatic cancer and proceeded with surgical resection. This revealed a T3N0 tumor which was resected with negative margins. Some difficulty in terms of recovery as well as an abscess. Pt therefore had difficulty proceeding with chemotherapy.  More recently the pt has been complaining of increasing abdominal pain for the last few months. Recent CT scan showed 2 cystic masses worrisome for recurrence. Endoscopic biopsy confirmed recurrent adenocarcinoma. I have been asked to see her for palliative radiation treatment to the abdomen in relation to these findings and ongoing pain.  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 CT chest was negative.    02/26/2014 Pathology Results FNA of pancreatic mass showed adenocarcinoma    03/22/2014 Initial Diagnosis Pancreatic adenocarcinoma   04/08/2014 Surgery robotic converted to hand-assisted laparoscopic distal pancreatectomy, splenectomy, partial colectomy, and partial removal of the left renal capsule for pancreatic adenocarcinoma. Surgical margins were negative. pT3N0   04/08/2014 - 05/04/2014 Hospital Admission admitted for pancreatic surgery, complicated with abdominal abcess, which was drained and treated with abx for pseudomonas infection, difficulty with eating and ambulatory, malnutrition, was discharged to rehab     04/08/2014 Pathology Results pT3N0, moderately differentiated invasive adenocarcinoma, spanning 4.5 cm, invading colonic submucosa. Lymphovascular invasion is identified, perineural invasion is identified. 10 lymph nodes were negative. Surgical margins were negative.   05/09/2014 - 05/21/2014 Hospital Admission Pt was admited for chest and abdominal pain, found to have left side pleural effusion. thoracentesis done by IR with 529ml fluids removed, cytology (-) malignant cells   10/02/2014 Imaging CT CAP showed a two new soft tissue versus cystic structures in the peripancreatic region, decresased left pleural effusion    11/05/2014 Pathology Results EGD/EUS needle biopsy of the soft tissue in the peripancreatic region showed adenocarcinoma     PREVIOUS RADIATION THERAPY: No   PAST MEDICAL HISTORY:  has a past medical history of Glaucoma; Clotting disorder ([place]); DVT (deep venous thrombosis) ([place]); GERD (gastroesophageal reflux disease); Hypercholesterolemia; Essential tremor; Diverticulosis; Arthritis; Hypertension; Colon polyps; UTI (lower urinary tract infection); Pulmonary embolism ([place]); Complication of anesthesia; Peripheral neuropathy ([place]); Cancer ([place]); Allergy; and Pancreas carcinoma ([place]) (11/05/14).     PAST SURGICAL HISTORY: Past Surgical History  Procedure Laterality Date  . Cholecystectomy    . Abdominal hysterectomy    . Knee surgery Bilateral     scope  . [place] filter    . Back surgery    . Eus N/A 02/26/2014    Procedure: UPPER ENDOSCOPIC ULTRASOUND (EUS) LINEAR;  Surgeon: Beryle Beams, MD;  Location: [facility];  Service: Endoscopy;  Laterality: N/A;  . Fine needle aspiration N/A 02/26/2014    Procedure: FINE NEEDLE  ASPIRATION (FNA) LINEAR;  Surgeon: Beryle Beams, MD;  Location: [facility];  Service: Endoscopy;  Laterality: N/A;  . Breast lumpectomy Right   . Spine surgery    . Xi robotic assisted laparoscopic distal pancreatectomy N/A 04/08/2014    Procedure: XI ROBOTIC ASSISTED CONVERTED LAPAROSCOPIC HAND ASSITED DISTAL PANCREATECTOMY/SPLENECTOMY WITH PARTIAL COLECTOMY;  Surgeon: [name], MD;  Location: [facility];  Service: General;  Laterality: N/A;  . Colon surgery    . Diagnostic laparoscopy    . Cataract extraction      left eye  . Eus N/A 11/05/2014    Procedure: FULL UPPER ENDOSCOPIC ULTRASOUND (EUS) RADIAL;  Surgeon: Carol Ada, MD;  Location: [facility];  Service: Endoscopy;  Laterality: N/A;  . Fine needle aspiration N/A 11/05/2014    Procedure: FINE NEEDLE ASPIRATION (FNA) LINEAR;  Surgeon: Carol Ada, MD;  Location: [facility];  Service: Endoscopy;  Laterality: N/A;    FAMILY HISTORY: family history includes Arthritis in her mother; Cancer in her paternal uncle; Cancer (age of onset: 45) in her sister; Cancer (age of onset: 9) in her father; Cataracts in her maternal grandmother; Diabetes in her father and paternal grandmother; Glaucoma in her sister; Heart disease in her mother; Hyperlipidemia in her father; Hypertension in her mother.   SOCIAL HISTORY:  reports that she has been smoking Cigarettes.  She has a 22.5 pack-year smoking history. She has never used smokeless tobacco. She reports that she does not drink alcohol or use illicit drugs.   ALLERGIES: Sulfa antibiotics   MEDICATIONS:  Current Outpatient Prescriptions  Medication Sig Dispense Refill  . atorvastatin (LIPITOR) 40 MG tablet Take 1 tablet (40 mg total) by mouth daily. 90 tablet 0  . cromolyn (OPTICROM) 4 % ophthalmic solution Place 1 drop into both eyes 4 (four) times daily.     Marland Kitchen diltiazem (CARTIA XT) 120 MG 24 hr capsule Take 1 capsule (120 mg total) by mouth daily. 90 capsule 0  . DORZOLAMIDE  HCL-TIMOLOL MAL OP Place 1 drop into the left eye 2 (two) times daily.     Marland Kitchen enoxaparin (LOVENOX) 100 MG/ML injection Inject 1 mL (100 mg total) into the skin daily. 30 Syringe 3  . latanoprost (XALATAN) 0.005 % ophthalmic solution Place 1 drop into the right eye at bedtime.     . Multiple Vitamin (MULTIVITAMIN WITH MINERALS) TABS tablet Take 2 tablets by mouth daily.    . ondansetron (ZOFRAN-ODT) 4 MG disintegrating tablet DISSOLVE 1 TABLET(4 MG) ON THE TONGUE EVERY 8 HOURS AS NEEDED FOR NAUSEA OR VOMITING 20 tablet 0  . OxyCODONE (OXYCONTIN) 15 mg T12A 12 hr tablet Take 1 tablet (15 mg total) by mouth every 12 (twelve) hours. 60 tablet 0  . pantoprazole (PROTONIX) 40 MG tablet Take 1 tablet (40 mg total) by mouth 2 (two) times daily before a meal. 90 tablet 3  . prednisoLONE acetate (PRED FORTE) 1 % ophthalmic suspension Place 1 drop into the left eye 4 (four) times daily.     . primidone (MYSOLINE) 50 MG tablet TAKE 1 TABLET BY MOUTH AT BEDTIME 90 tablet 0  . propranolol ER (INDERAL LA) 120 MG 24 hr capsule TAKE 1 CAPSULE(120 MG) BY MOUTH DAILY 30 capsule 0  . atropine 1 % ophthalmic solution Place 1 drop into the left eye 2 (two) times daily.    Marland Kitchen oxyCODONE-acetaminophen (PERCOCET) 7.5-325 MG tablet Take 1 tablet by mouth  every 6 (six) hours as needed for severe pain. 120 tablet 0   Current Facility-Administered Medications  Medication Dose Route Frequency Provider Last Rate Last Dose  . cefTRIAXone (ROCEPHIN) injection 1 g  1 g Intramuscular Q24H Lucille Passy, MD   1 g at 09/11/14 1044     REVIEW OF SYSTEMS:  A 15 point review of systems is documented in the electronic medical record. This was obtained by the nursing staff. However, I reviewed this with the patient to discuss relevant findings and make appropriate changes.  Pertinent items are noted in HPI.  She reports laying down most of the day in a "fetal position." She also reports fatigue, weakness, nausea, and pain across the  abdomen. Sometimes, this pain migrates throughout the body. She reports her pain medication is not working well to relieve pain. She is on OxyContin 15 mg every 12 hours and Percocet 7.5-325 mg every 6 hours PRN. She reports taking the Percocet every 6 hours. Daughter reports the pt's pain started in July and has steadily become worse. The nausea is being managed by Zofran; which helps some. She reports constipation a while back, but took a stool softener and had a normal bowel movement.  PHYSICAL EXAM:  height is 5' 9.5" (1.765 m) and weight is 142 lb 9.6 oz (64.683 kg). Her oral temperature is 98.1 F (36.7 C). Her blood pressure is 106/81 and her pulse is 88. Her respiration is 20 and oxygen saturation is 96%.   The patient is in the supine position on the exam table. She presented to the clinic in a wheelchair. General: Well-developed, in no acute distress HEENT: Normocephalic, atraumatic Cardiovascular: Regular rate and rhythm Respiratory: Clear to auscultation bilaterally GI: Soft, nontender, normal bowel sounds. Well healed incision in midline of the abdomen. Pt had some slight tenderness to palpation. Extremities: No edema present.  LABORATORY DATA:  Lab Results  Component Value Date   WBC 7.5 11/17/2014   HGB 15.5 11/17/2014   HCT 46.1 11/17/2014   MCV 93.2 11/17/2014   PLT 212 11/17/2014   Lab Results  Component Value Date   NA 140 11/17/2014   K 4.0 11/17/2014   CL 96* 10/02/2014   CO2 28 11/17/2014   Lab Results  Component Value Date   ALT 18 11/17/2014   AST 43* 11/17/2014   ALKPHOS 65 11/17/2014   BILITOT 0.40 11/17/2014      RADIOGRAPHY: No results found.     IMPRESSION: The patient has adenocarcinoma of the pancreas status post resection: stage T3N0. Pathology revealed additional features including negative margins.   Notable features include ongoing severe abdominal pain.  Patient is a good candidate for palliative radiation treatment to the abdomen.  Patient appears quite frail today. I would favorradiation therapy alone 42 weeks of treatment.  I therefore discussed with the patient the rationale for this treatment approach. We discussed the potential benefit of radiotherapy in improving local/regional control. We also discussed the potential side effects/risks of radiation treatment. All of her questions were answered.  PLAN: The patient will proceed with the above plan. The patient will be scheduled for simulation such that we can begin treatment planning. CT simulation has been scheduled on Wednesday, 12/01/14 at 11AM. At this time, I anticipate beginning the radiation treatment on Monday, 12/06/14.  I advised the patient to increase her OxyContin 15 mg to 3 times a day, every 8 hours, for pain management. The patient has a f/u with Dr. Burr Medico on 12/01/14 at  10AM.     This document serves as a record of services personally performed by Kyung Rudd, MD. It was created on his behalf by Darcus Austin, a trained medical scribe. The creation of this record is based on the scribe's personal observations and the provider's statements to them. This document has been checked and approved by the attending provider.     ________________________________ Jodelle Gross, MD, PhD

## 2014-11-29 NOTE — Progress Notes (Signed)
Please see the Nurse Progress Note in the MD Initial Consult Encounter for this patient. 

## 2014-11-30 ENCOUNTER — Other Ambulatory Visit: Payer: Self-pay | Admitting: Urology

## 2014-11-30 ENCOUNTER — Ambulatory Visit (HOSPITAL_COMMUNITY)
Admission: RE | Admit: 2014-11-30 | Discharge: 2014-11-30 | Disposition: A | Discharge status: Home / Self Care | Payer: Medicare Other | Source: Ambulatory Visit | Attending: Urology | Admitting: Urology

## 2014-11-30 DIAGNOSIS — Z452 Encounter for adjustment and management of vascular access device: Secondary | ICD-10-CM | POA: Insufficient documentation

## 2014-11-30 DIAGNOSIS — N312 Flaccid neuropathic bladder, not elsewhere classified: Secondary | ICD-10-CM

## 2014-11-30 DIAGNOSIS — R339 Retention of urine, unspecified: Secondary | ICD-10-CM | POA: Insufficient documentation

## 2014-11-30 MED ORDER — LIDOCAINE HCL 1 % IJ SOLN
INTRAMUSCULAR | Status: AC
  Filled 2014-11-30: qty 20

## 2014-11-30 MED ORDER — LIDOCAINE VISCOUS 2 % MT SOLN
OROMUCOSAL | Status: AC
  Administered 2014-11-30: 5 mL
  Filled 2014-11-30: qty 15

## 2014-11-30 MED ORDER — IOHEXOL 300 MG/ML  SOLN
40.0000 mL | Freq: Once | INTRAMUSCULAR | Status: DC | PRN
  Administered 2014-11-30: 40 mL
  Filled 2014-11-30: qty 40

## 2014-11-30 NOTE — Procedures (Signed)
Exchange suprapubic cath under fluoro, upsize to 21J No complication No blood loss. See complete dictation in [facility].

## 2014-12-01 ENCOUNTER — Ambulatory Visit (HOSPITAL_BASED_OUTPATIENT_CLINIC_OR_DEPARTMENT_OTHER): Payer: Medicare Other | Admitting: Hematology

## 2014-12-01 ENCOUNTER — Ambulatory Visit
Admission: RE | Admit: 2014-12-01 | Discharge: 2014-12-01 | Disposition: A | Discharge status: Home / Self Care | Payer: Medicare Other | Source: Ambulatory Visit | Attending: Radiation Oncology | Admitting: Radiation Oncology

## 2014-12-01 ENCOUNTER — Ambulatory Visit (HOSPITAL_BASED_OUTPATIENT_CLINIC_OR_DEPARTMENT_OTHER): Payer: Medicare Other

## 2014-12-01 ENCOUNTER — Encounter: Payer: Self-pay | Admitting: Hematology

## 2014-12-01 VITALS — BP 101/68 | HR 138 | Temp 98.0°F | Resp 18 | Ht 69.5 in | Wt 141.7 lb

## 2014-12-01 DIAGNOSIS — Z7901 Long term (current) use of anticoagulants: Secondary | ICD-10-CM | POA: Diagnosis not present

## 2014-12-01 DIAGNOSIS — C252 Malignant neoplasm of tail of pancreas: Secondary | ICD-10-CM

## 2014-12-01 DIAGNOSIS — R634 Abnormal weight loss: Secondary | ICD-10-CM

## 2014-12-01 DIAGNOSIS — R110 Nausea: Secondary | ICD-10-CM | POA: Diagnosis not present

## 2014-12-01 DIAGNOSIS — Z510 Encounter for antineoplastic radiation therapy: Secondary | ICD-10-CM | POA: Diagnosis not present

## 2014-12-01 DIAGNOSIS — G893 Neoplasm related pain (acute) (chronic): Secondary | ICD-10-CM | POA: Diagnosis not present

## 2014-12-01 DIAGNOSIS — R630 Anorexia: Secondary | ICD-10-CM | POA: Diagnosis not present

## 2014-12-01 DIAGNOSIS — I82593 Chronic embolism and thrombosis of other specified deep vein of lower extremity, bilateral: Secondary | ICD-10-CM

## 2014-12-01 MED ORDER — DRONABINOL 2.5 MG PO CAPS: 2.5000 mg | ORAL_CAPSULE | Freq: Two times a day (BID) | ORAL | Status: DC

## 2014-12-01 MED ORDER — MORPHINE SULFATE (PF) 4 MG/ML IV SOLN
INTRAVENOUS | Status: AC
  Filled 2014-12-01: qty 1

## 2014-12-01 MED ORDER — SODIUM CHLORIDE 0.9 % IV SOLN
1000.0000 mL | Freq: Once | INTRAVENOUS | Status: AC
  Administered 2014-12-01: 1000 mL via INTRAVENOUS

## 2014-12-01 MED ORDER — OXYCODONE HCL 10 MG PO TABS: 10.0000 mg | ORAL_TABLET | ORAL | Status: DC | PRN

## 2014-12-01 MED ORDER — SODIUM CHLORIDE 0.9 % IV SOLN
Freq: Once | INTRAVENOUS | Status: AC
  Administered 2014-12-01: 12:00:00 via INTRAVENOUS
  Filled 2014-12-01: qty 4

## 2014-12-01 MED ORDER — MORPHINE SULFATE 4 MG/ML IJ SOLN
2.0000 mg | Freq: Once | INTRAMUSCULAR | Status: AC
  Administered 2014-12-01: 2 mg via INTRAVENOUS
  Filled 2014-12-01: qty 1

## 2014-12-01 MED ORDER — OXYCODONE HCL ER 30 MG PO T12A: 30.0000 mg | EXTENDED_RELEASE_TABLET | Freq: Two times a day (BID) | ORAL | Status: DC

## 2014-12-01 NOTE — Progress Notes (Signed)
1330.  Patient ready for discharge.  She will go home with dtr and return at 3pm to RT for [facility].  Patient states her nausea is better and her pain is much better.  She rates it a 5/10. It was a 9/10.

## 2014-12-01 NOTE — Patient Instructions (Signed)

## 2014-12-01 NOTE — Progress Notes (Signed)
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 CT chest was negative.    02/26/2014 Pathology Results FNA of pancreatic mass showed adenocarcinoma    03/22/2014 Initial Diagnosis Pancreatic adenocarcinoma   04/08/2014 Surgery robotic converted to hand-assisted laparoscopic distal pancreatectomy, splenectomy, partial colectomy, and partial removal of the left renal capsule for pancreatic adenocarcinoma. Surgical margins were negative. pT3N0   04/08/2014 - 05/04/2014 Hospital Admission admitted for pancreatic surgery, complicated with abdominal abcess, which was drained and treated with abx for pseudomonas infection, difficulty with eating and ambulatory, malnutrition, was discharged to rehab     04/08/2014 Pathology Results pT3N0, moderately differentiated invasive adenocarcinoma, spanning 4.5 cm, invading colonic submucosa. Lymphovascular invasion is identified, perineural invasion is identified. 10 lymph nodes were negative. Surgical margins were negative.   05/09/2014 - 05/21/2014 Hospital Admission Pt was admited for chest and abdominal pain, found to have left side pleural effusion. thoracentesis  done by IR with 527ml fluids removed, cytology (-) malignant cells   10/02/2014 Imaging CT CAP showed [name] two new soft tissue versus cystic structures in the peripancreatic region, decresased left pleural effusion    11/05/2014 Pathology Results EGD/EUS needle biopsy of the soft tissue in the peripancreatic region showed adenocarcinoma     HISTORY OF PRESENTING ILLNESS:  [name] 69 y.o. female is here because of her recurrent DVT and incidental discovered pancreatic mass.  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 She had IVC filter placed back then and put on coumadin. She had second episode of right leg DVT when she was on coumadin in 2009, and continued coumadin until one year ago when she had trouble for monitoring her INR. She was then switched to Aspirin 81mg  dialy. 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 CT abdomen was ordered to further evaluate the DVT and abdominal pain on 12/22/2013, which showed thrombus in IVC and right common iliac vein below IVC. CT scan also showed [name] 2.8 x 2.5 cm mass within the tail of pancreas, this was confirmed by the MRI abdomen, with probably encasement of the splenic vessels. No significant abdominal adenopathy. CT of the chest was negative for distant metastasis. She was referred to see GI Dr.Hung and will have EGD/EUS for pancreatic mass biopsy in [name] week.   She has been having abdominal pain after eating in the pat few month, along with diarrhea and constipation, which has been chronic since her cholecystectomy in 199. Her appetite  is fare, and weight is stable. She also has tremor since teenager, and got worse lately, along with gait instaedyness, was seen by neurologist Dr. Carles Collet. She was put on primidone  which has help quite bit.   She had both knee surgery and was using [name] crane until recently when she was diagnosed with DVT and she has been using [name] walker. She has been having some gait instability lately, she was referred by Dr. Carles Collet and MRI of brain showed mild chronic small vessel ischemia disease and cerebral atrophy.  CURRENT THERAPY: supportive, starting palliative radiation on 12/06/14  INTERIM HISTORY [name] returns for follow-up with her daughter. She is doing poorly. Her main issue is the upper abdominal pain, she rates at 8-9 out of 10, her OxyContin was increased to every 8 hours by Dr. Lisbeth Renshaw 2 days ago, which has not made any difference. She takes Percocet as needed as breakthrough pain. She also has intermittent moderate nausea, and occasional vomiting. She takes sublingual Zofran as needed. Her appetite is poor, she eats little, able to drink moderate amount for this. No fever or chills, she lost about 8 pounds in the past 2-3 weeks.  MEDICAL HISTORY:  Past Medical History  Diagnosis Date  . Glaucoma   . Clotting disorder ([facility])   . DVT (deep venous thrombosis) ([facility])     LEFT  . GERD (gastroesophageal reflux disease)   . Hypercholesterolemia   . Essential tremor   . Diverticulosis   . Arthritis     knees and back  . Hypertension   . Colon polyps   . UTI (lower urinary tract infection)   . Pulmonary embolism ([place])     5 yrs ago. Lovenox started about 1 yr ago.  . Complication of anesthesia     slow to wake up   . Peripheral neuropathy (H[facility])     feet bilaterally  . Cancer [facility])     Pancreatic- Surgery 3'16- Dr. Purcell Mouton)- no other tx.. Dr. Jarious Lyon([facility]).  . Allergy     sulfa antibiotics  . Pancreas carcinoma ([place]) 11/05/14    adenocarcinoma    SURGICAL HISTORY: Past Surgical History  Procedure Laterality Date  . Cholecystectomy    . Abdominal hysterectomy    . Knee surgery Bilateral     scope  . [place] filter    . Back surgery    . Eus N/[name] 02/26/2014     Procedure: UPPER ENDOSCOPIC ULTRASOUND (EUS) LINEAR;  Surgeon: Beryle Beams, MD;  Location: [facility] ENDOSCOPY;  Service: Endoscopy;  Laterality: N/[name];  . Fine needle aspiration N/[name] 02/26/2014    Procedure: FINE NEEDLE ASPIRATION (FNA) LINEAR;  Surgeon: Beryle Beams, MD;  Location: [facility] ENDOSCOPY;  Service: Endoscopy;  Laterality: N/[name];  . Breast lumpectomy Right   . Spine surgery    . Xi robotic assisted laparoscopic distal pancreatectomy N/[name] 04/08/2014    Procedure: XI ROBOTIC ASSISTED CONVERTED LAPAROSCOPIC HAND ASSITED DISTAL PANCREATECTOMY/SPLENECTOMY WITH PARTIAL COLECTOMY;  Surgeon: [name], MD;  Location: [facility] ORS;  Service: General;  Laterality: N/[name];  . Colon surgery    . Diagnostic laparoscopy    . Cataract extraction      left eye  . Eus N/[name] 11/05/2014    Procedure: FULL UPPER ENDOSCOPIC ULTRASOUND (EUS) RADIAL;  Surgeon: Carol Ada, MD;  Location: [facility] ENDOS[facility];  Service: Endoscopy;  Laterality: N/[name];  . Fine needle aspiration N/[name] 11/05/2014    Procedure: FINE NEEDLE ASPIRATION (FNA) LINEAR;  Surgeon: Carol Ada, MD;  Location: [facility] ENDOS[facility];  Service: Endoscopy;  Laterality: N/[name];    SOCIAL HISTORY: History   Social History  . Marital Status: Divorced    Spouse Name: N/[name]    Number of Children: 3  . Years of Education: N/[name]   Occupational History   She used to work for [employer], retired now    Social History Main Topics  . Smoking status: Current Every Day Smoker -- 0.50 packs/day, for 40 years     Types: Cigarettes  . Smokeless tobacco: Not on file  . Alcohol Use: 0.0 oz/week, social drinker     0 Not specified per week     Comment: occ- glass of wine [name] week   . Drug Use: No  . Sexual Activity: Not on file   Other Topics Concern  . Not on file   Social History Narrative   Divorced, 3 children   Right handed   Associates degree   3 per week    FAMILY HISTORY: Family History  Problem Relation Age of Onset  . Diabetes Father 48  . Diabetes Maternal  Grandmother   . Hypertension Mother   . Glaucoma Sister   . Cataracts Maternal Grandmother   . Cancer Sister 36    Breast    ALLERGIES:  is allergic to sulfa antibiotics.  MEDICATIONS:  Current Outpatient Prescriptions  Medication Sig Dispense Refill  . atorvastatin (LIPITOR) 40 MG tablet Take 1 tablet (40 mg total) by mouth daily. 90 tablet 0  . atropine 1 % ophthalmic solution Place 1 drop into the left eye 2 (two) times daily.    . cromolyn (OPTICROM) 4 % ophthalmic solution Place 1 drop into both eyes 4 (four) times daily.     Marland Kitchen diltiazem (CARTIA XT) 120 MG 24 hr capsule Take 1 capsule (120 mg total) by mouth daily. 90 capsule 0  . DORZOLAMIDE HCL-TIMOLOL MAL OP Place 1 drop into the left eye 2 (two) times daily.     Marland Kitchen enoxaparin (LOVENOX) 100 MG/ML injection Inject 1 mL (100 mg total) into the skin daily. 30 Syringe 3  . latanoprost (XALATAN) 0.005 % ophthalmic solution Place 1 drop into the right eye at bedtime.     . Multiple Vitamin (MULTIVITAMIN WITH MINERALS) TABS tablet Take 2 tablets by mouth daily.    . ondansetron (ZOFRAN-ODT) 4 MG disintegrating tablet DISSOLVE 1 TABLET(4 MG) ON THE TONGUE EVERY 8 HOURS AS NEEDED FOR NAUSEA OR VOMITING 20 tablet 0  . oxyCODONE-acetaminophen (PERCOCET) 7.5-325 MG tablet Take 1 tablet by mouth every 6 (six) hours as needed for severe pain. 120 tablet 0  . pantoprazole (PROTONIX) 40 MG tablet Take 1 tablet (40 mg total) by mouth 2 (two) times daily before [name] meal. 90 tablet 3  . prednisoLONE acetate (PRED FORTE) 1 % ophthalmic suspension Place 1 drop into the left eye 4 (four) times daily.     . primidone (MYSOLINE) 50 MG tablet TAKE 1 TABLET BY MOUTH AT BEDTIME 90 tablet 0  . propranolol ER (INDERAL LA) 120 MG 24 hr capsule TAKE 1 CAPSULE(120 MG) BY MOUTH DAILY 30 capsule 0  . dronabinol (MARINOL) 2.5 MG capsule Take 1-2 capsules (2.5-5 mg total) by mouth 2 (two) times daily before [name] meal. 60 capsule 1  . oxyCODONE 30 MG 12 hr tablet Take 30  mg by mouth every 12 (twelve) hours. 60 each 0  . Oxycodone HCl 10 MG TABS Take 1 tablet (10 mg total) by mouth every 4 (four) hours as needed. 90 tablet  0   Current Facility-Administered Medications  Medication Dose Route Frequency Provider Last Rate Last Dose  . cefTRIAXone (ROCEPHIN) injection 1 g  1 g Intramuscular Q24H Lucille Passy, MD   1 g at 09/11/14 1044    REVIEW OF SYSTEMS:   Constitutional: Denies fevers, chills or abnormal night sweats, (+) fatigue  Eyes: Denies blurriness of vision, double vision or watery eyes Ears, nose, mouth, throat, and face: Denies mucositis or sore throat Respiratory: Denies cough, dyspnea or wheezes Cardiovascular: Denies palpitation, chest discomfort or lower extremity swelling Gastrointestinal:  (+) nausea and vomiting, no heartburn or change in bowel habits. (+) worsening abdominal pain  Skin: Denies abnormal skin rashes Lymphatics: Denies new lymphadenopathy or easy bruising Neurological:Denies numbness, tingling or new weaknesses Behavioral/Psych: Mood is stable, no new changes  Extremities: Bilateral lower extremity edema and leg pain, left more than right. All other systems were reviewed with the patient and are negative.  PHYSICAL EXAMINATION: ECOG PERFORMANCE STATUS: 3 - Symptomatic, >50% confined to bed  Filed Vitals:   12/01/14 1021  BP: 101/68  Pulse: 138  Temp: 98 F (36.7 C)  Resp: 18   Filed Weights   12/01/14 1021  Weight: 141 lb 11.2 oz (64.275 kg)    GENERAL:alert, no distress and comfortable, sitting in wheelchair.  SKIN: skin color, texture, turgor are normal, no rashes. She has some bruises on her abdomen at the Lovenox injection site. She also has [name] 2 cm subcutaneous nodule on the right flank area, likely [name] hematoma. EYES: normal, conjunctiva are pink and non-injected, sclera clear OROPHARYNX:no exudate, no erythema and lips, buccal mucosa, and tongue normal  NECK: supple, thyroid normal size, non-tender, without  nodularity LYMPH:  no palpable lymphadenopathy in the cervical, axillary or inguinal LUNGS: clear to auscultation and percussion with normal breathing effort HEART: regular rate & rhythm and no murmurs and no lower extremity edema ABDOMEN:abdomen soft, mild tenderness at mid and low abdomen, normal bowel sounds Musculoskeletal:no cyanosis of digits and no clubbing  PSYCH: alert & oriented x 3 with fluent speech NEURO: no focal motor/sensory deficits Ext: (+) Bilateral Ankle swelling  LABORATORY DATA:  I have reviewed the data as listed Lab Results  Component Value Date   WBC 7.5 11/17/2014   HGB 15.5 11/17/2014   HCT 46.1 11/17/2014   MCV 93.2 11/17/2014   PLT 212 11/17/2014   CMP Latest Ref Rng 11/17/2014 10/02/2014 06/30/2014  Glucose 70 - 140 mg/dl 114 111(H) 141(H)  BUN 7.0 - 26.0 mg/dL 8.7 7 4.6(L)  Creatinine 0.6 - 1.1 mg/dL 0.8 0.76 0.8  Sodium 136 - 145 mEq/L 140 136 142  Potassium 3.5 - 5.1 mEq/L 4.0 2.8(L) 3.1(L)  Chloride 101 - 111 mmol/L - 96(L) -  CO2 22 - 29 mEq/L 28 26 24   Calcium 8.4 - 10.4 mg/dL 10.1 9.4 8.8  Total Protein 6.4 - 8.3 g/dL 7.0 7.6 7.8  Total Bilirubin 0.20 - 1.20 mg/dL 0.40 0.7 0.26  Alkaline Phos 40 - 150 U/L 65 62 107  AST 5 - 34 U/L 43(H) 68(H) 36(H)  ALT 0 - 55 U/L 18 21 14      PATHOLOGY REPORT 04/08/2014 REASON FOR ADDENDUM, AMENDMENT OR CORRECTION: [id number]: AMENDMENT: Amendment issued to report additional gross information in part #2. ds 04/14/14 04:55:45 PM FINAL DIAGNOSIS Diagnosis 04/08/2014 1. Colon, segmental resection, transverse - PANCREATIC ADENOCARCINOMA INVADING COLONIC SUBMUCOSA. - LYMPHOVASCULAR INVASION IS IDENTIFIED. - THE COLONIC RESECTION MARGINS ARE NEGATIVE FOR ADENOCARCINOMA. 2. Pancreas, resection - INVASIVE ADENOCARCINOMA, MODERATELY  DIFFERENTIATED, SPANNING 4.5 CM. - LYMPHOVASCULAR INVASION IS IDENTIFIED. - PERINEURAL INVASION IS IDENTIFIED. - ADENOCARCINOMA IS BROADLY PRESENT AT T 10 mg 1 negative.  Surgical margins were negative. HE CLINICAL COLONIC MARGIN OF SPECIMEN #2. - ADENOCARCINOMA IS ADHERENT TO THE RENAL CAPSULE, BUT DOES NOT INVOLVE RENAL PARENCHYMA. - ADENOCARCINOMA IS ADHERENT TO THE SPLENIC CAPSULE, BUT DOES NOT INVOLVE SPLENIC PARENCHYMA. - THE PANCREATIC PARENCHYMAL RESECTION MARGIN IS NEGATIVE FOR ADENOCARCINOMA. - THERE IS NO EVIDENCE OF CARCINOMA IN 10 OF 10 LYMPH NODES (0/10). - SEE ONCOLOGY TABLE BELOW. Microscopic Comment 2. PANCREAS (EXOCRINE): Procedure: Distal pancreatectomy with adherent renal capsule and portion of spleen and transverse colon resection. Tumor Site: Distal pancreatic tail. Tumor Size: 4.5 cm (gross measurement). Histologic Type: Adenocarcinoma. Histologic Grade (ductal carcinoma only): G2: Moderately differentiated Microscopic Tumor Extension: Adenocarcinoma involves colonic submucosa, renal capsule, and splenic capsule. Margins: The pancreatic parenchymal margin is negative for adenocarcinoma. Distance of invasive carcinoma from closest margin: 5.2 cm to the pancreatic parenchymal margin, see comment. Carcinoma in situ / high-grade dysplasia: Not identified. Treatment Effect: N/[name]. 1 of 3 Amended copy Amended FINAL for [name], [name] ([id number]) Microscopic Comment(continued) Lymph-Vascular Invasion: Present, diffuse Perineural Invasion: Present, diffuse Lymph nodes: number examined 10; number positive: 0. Pathologic Staging: pT3, pN0. Additional Pathologic Findings: No significant findings. Ancillary Studies: N/[name]. Comment(s): There is [name] primary 4.5 cm moderately differentiated pancreatic adenocarcinoma present in the tail of the pancreas. There is direct extension of this tumor to the submucosa of the transverse colon (specimen #1). In addition, adenocarcinoma extends to and involves the renal capsule and splenic capsule. Despite four lymph nodes being negative for parenchymal metastases, one of the four lymph nodes does show  perinodal deposits of adenocarcinoma. The adenocarcinoma present in the submucosa of the transverse colon, renal capsule, splenic capsule, and perinodal soft tissue is interpreted to represent direct extension of the primary tumor, rather than metastatic sites and thus, the tumor is staged as above.  Comment There are malignant cells with prominent nucleili and intracytoplasmic vacuoles, arranged in acinar pattern. The overall morphologic findings favor [name] diagnosis of well differentiated adenocarcinoma.   Diagnosis 11/05/2014 FINE NEEDLE ASPIRATION, ENDOSCOPIC PANCREATIC TAIL (SPECIMEN 1 OF 1, COLLECTED ON 11/05/14): MALIGNANT CELLS PRESENT SEE COMMENT COMMENT: THE MALIGNANT CELLS ARE CONSISTENT WITH ADENOCARCINOMA. THE CASE WAS REVIEWED BY DR Tresa Moore, WHO CONCURS.   RADIOGRAPHIC STUDIES: I have personally reviewed the radiological images as listed and agreed with the findings in the report.  Ct Abdomen Pelvis W Contrast 10/02/2014    IMPRESSION: Foley catheter within thick-walled bladder, which may indicate infection/cystitis.  Two new soft tissue versus cystic structures in the peripancreatic region since 06/15/2014. Although these may represent pseudocysts, recurrent neoplasm is not entirely excluded and close interval follow-up or further evaluation is recommended.  Decreasing left pleural effusion which now appears slightly loculated. CT chest 10/07/2014 IMPRESSION: 1. Stable small left pleural effusion with associated passive atelectasis. No new lung nodule or other significant new findings in the chest. Stable small left internal mammary node warrants observation.   ASSESSMENT & PLAN:  69 year old female, with past medical history of recurrent bilateral lower extremity DVTs, presented with acute extensive left lower extremity DVT and thrombus in the IVC and the right, iliac vein, and chronic DVT in the right popliteal vein. She was also found to have [name] pancreatic tail mass  on the CT scan.  1. Pancreatic adenocarcinoma, pT3 N0 M0, stage IIA, local recurrence in 09/2014  - Her restaging CT scan from  10/02/2014 showed two small soft tissue vs cystic lesion in pancrease. -I discussed her biopsy results with patient and her daughter in details. Unfortunately she has local recurrence I'll check with her surgeon Dr. Barry Dienes, but unlikely she will offer more surgery.  -We reviewed her case in our tumor board. Unfortunately this is likely incurable disease.  -She was seen by radiation oncologist Dr. Lisbeth Renshaw, who offered palliative radiation for pain control, she is going to have simulation today and start next week. -Given her worsening pain and performance status, I'm concerned about her tolerance to radiation also. I discussed alternative 6 block for pain control. Her daughter seemed to be favor of radiation. -Given her rapid deterioration, she is unlikely to be [name] candidate for systemic chemotherapy after radiation. -We discussed the goal of care with her and her daughter today, they agree with palliative approach to preserve her quality of life. -I discussed the option of home palliative care and hospice, she will think about it.  2. Abdominal pain due to local recurrent pancreatic cancer -her pain is poorly controlled  -I recommend her to increase OxyContin to 30 mg every 12 hours -I also gave her [name] prescription of oxycodone 10 mg (increased from 5 mg) every 4 hours as needed  3. Recurrent DVT  -I reviewed her lab results of prothrombin gene mutation, factor V Leyden mutation, and antithrombin III, which were all negative.  -Giving her recurrent unprovoked DVT, I recommend anticoagulation indefinitely. -she has some pain at injection sites, we discussed other [name]/C options, such as coumadin and xarelto, due to the cost issue, she wants to remain on lovenox   -continue lovenox 100 mg once daily. Her renal function is normal  4. Nausea, Anorexia, weight loss  -Given her poor  by mouth intake and nausea, I'll give her IV fluids with antiemesis and IV morphine today. -add Marinol 2.5 mg twice [name] day, we'll titrate her dose in the next few weeks -I also called in Compazine today, to use alternatively with Zofran -I encouraged her to continue nutrition supplement  Plan:  -IVF today  -I will see her back in one week -I increased her pain meds today -Add Marinol and Compazine  -will discuss code status on next visit   All questions were answered. The patient knows to call the clinic with any problems, questions or concerns. I spent 30 minutes counseling the patient face to face. The total time spent in the appointment was 40 minutes and more than 50% was on counseling.     Truitt Merle, MD 12/01/2014   10:11 PM

## 2014-12-02 ENCOUNTER — Other Ambulatory Visit: Payer: Self-pay | Admitting: Neurology

## 2014-12-02 NOTE — Telephone Encounter (Signed)
Primidone refill requested. Per last office note- patient to remain on medication. Refill approved for one month with note that patient needs appt for further refills and sent to patient's pharmacy.

## 2014-12-03 DIAGNOSIS — Z510 Encounter for antineoplastic radiation therapy: Secondary | ICD-10-CM | POA: Diagnosis not present

## 2014-12-06 ENCOUNTER — Ambulatory Visit
Admission: RE | Admit: 2014-12-06 | Discharge: 2014-12-06 | Disposition: A | Discharge status: Home / Self Care | Payer: Medicare Other | Source: Ambulatory Visit | Attending: Radiation Oncology | Admitting: Radiation Oncology

## 2014-12-06 ENCOUNTER — Ambulatory Visit: Payer: Medicare Other | Admitting: Radiation Oncology

## 2014-12-06 DIAGNOSIS — C252 Malignant neoplasm of tail of pancreas: Secondary | ICD-10-CM

## 2014-12-06 DIAGNOSIS — Z510 Encounter for antineoplastic radiation therapy: Secondary | ICD-10-CM | POA: Diagnosis not present

## 2014-12-06 MED ORDER — ALRA NON-METALLIC DEODORANT (RAD-ONC): 1.0000 "application " | Freq: Once | TOPICAL | Status: DC

## 2014-12-06 MED ORDER — RADIAPLEXRX EX GEL: Freq: Once | CUTANEOUS | Status: DC

## 2014-12-06 NOTE — Progress Notes (Signed)
Patient education  Done ,Radiation therapy and you book given,my business card, discussed ways to manage side effects skin irritation, nausea,vomiting, diarrhea,weight loss, dehydration, possible need for IVF'S, fatigue, may need to eat 5-6 smaller meals and snacks, take zofran for nausea, imodium ad for diarrhea, drink plenty fluids, increase protein in diet, monitor patient weekly and prn, sees MD weekly and prn, verbal understanding, teach back given

## 2014-12-07 ENCOUNTER — Ambulatory Visit: Payer: Medicare Other | Admitting: Hematology

## 2014-12-07 ENCOUNTER — Ambulatory Visit
Admission: RE | Admit: 2014-12-07 | Discharge: 2014-12-07 | Disposition: A | Discharge status: Home / Self Care | Payer: Medicare Other | Source: Ambulatory Visit | Attending: Radiation Oncology | Admitting: Radiation Oncology

## 2014-12-07 ENCOUNTER — Telehealth: Payer: Self-pay | Admitting: Hematology

## 2014-12-07 ENCOUNTER — Ambulatory Visit: Payer: Medicare Other

## 2014-12-07 DIAGNOSIS — T83518A Infection and inflammatory reaction due to other urinary catheter, initial encounter: Secondary | ICD-10-CM | POA: Diagnosis not present

## 2014-12-07 DIAGNOSIS — R319 Hematuria, unspecified: Secondary | ICD-10-CM | POA: Diagnosis not present

## 2014-12-07 NOTE — Telephone Encounter (Signed)
per pof tos ch pt appt-cld & spoke to pt daughter and gave sch fopr appt today-adv would have to call [facility] to sch IV fluids and willc all & give time

## 2014-12-07 NOTE — Telephone Encounter (Signed)
per pof to sch pt appt-cld & spoke to dauhger gave appt time & location for IV fluids @ [facility]-daughter agreed to time & date

## 2014-12-08 ENCOUNTER — Ambulatory Visit
Admission: RE | Admit: 2014-12-08 | Discharge: 2014-12-08 | Disposition: A | Discharge status: Home / Self Care | Payer: Medicare Other | Source: Ambulatory Visit | Attending: Radiation Oncology | Admitting: Radiation Oncology

## 2014-12-08 ENCOUNTER — Ambulatory Visit: Payer: Medicare Other

## 2014-12-09 ENCOUNTER — Telehealth: Payer: Self-pay | Admitting: Hematology

## 2014-12-09 ENCOUNTER — Encounter: Payer: Self-pay | Admitting: *Deleted

## 2014-12-09 ENCOUNTER — Ambulatory Visit: Payer: Medicare Other | Admitting: Hematology

## 2014-12-09 ENCOUNTER — Ambulatory Visit
Admission: RE | Admit: 2014-12-09 | Discharge: 2014-12-09 | Disposition: A | Discharge status: Home / Self Care | Payer: Medicare Other | Source: Ambulatory Visit | Attending: Radiation Oncology | Admitting: Radiation Oncology

## 2014-12-09 ENCOUNTER — Other Ambulatory Visit: Payer: Self-pay | Admitting: *Deleted

## 2014-12-09 ENCOUNTER — Ambulatory Visit: Payer: Medicare Other

## 2014-12-09 ENCOUNTER — Ambulatory Visit (HOSPITAL_COMMUNITY)
Admission: RE | Admit: 2014-12-09 | Discharge: 2014-12-09 | Disposition: A | Discharge status: Home / Self Care | Payer: Medicare Other | Source: Ambulatory Visit | Attending: Hematology | Admitting: Hematology

## 2014-12-09 ENCOUNTER — Telehealth: Payer: Self-pay | Admitting: *Deleted

## 2014-12-09 ENCOUNTER — Encounter: Payer: Self-pay | Admitting: Hematology

## 2014-12-09 ENCOUNTER — Ambulatory Visit (HOSPITAL_BASED_OUTPATIENT_CLINIC_OR_DEPARTMENT_OTHER): Payer: Medicare Other | Admitting: Hematology

## 2014-12-09 VITALS — BP 108/76 | HR 106 | Temp 98.2°F | Resp 20 | Ht 69.5 in | Wt 139.3 lb

## 2014-12-09 DIAGNOSIS — I82501 Chronic embolism and thrombosis of unspecified deep veins of right lower extremity: Secondary | ICD-10-CM

## 2014-12-09 DIAGNOSIS — R110 Nausea: Secondary | ICD-10-CM | POA: Diagnosis not present

## 2014-12-09 DIAGNOSIS — C252 Malignant neoplasm of tail of pancreas: Secondary | ICD-10-CM | POA: Insufficient documentation

## 2014-12-09 DIAGNOSIS — J90 Pleural effusion, not elsewhere classified: Secondary | ICD-10-CM | POA: Diagnosis not present

## 2014-12-09 DIAGNOSIS — R630 Anorexia: Secondary | ICD-10-CM | POA: Diagnosis not present

## 2014-12-09 DIAGNOSIS — E46 Unspecified protein-calorie malnutrition: Secondary | ICD-10-CM

## 2014-12-09 DIAGNOSIS — G893 Neoplasm related pain (acute) (chronic): Secondary | ICD-10-CM

## 2014-12-09 DIAGNOSIS — Z7901 Long term (current) use of anticoagulants: Secondary | ICD-10-CM

## 2014-12-09 DIAGNOSIS — R634 Abnormal weight loss: Secondary | ICD-10-CM | POA: Diagnosis not present

## 2014-12-09 DIAGNOSIS — I824Y2 Acute embolism and thrombosis of unspecified deep veins of left proximal lower extremity: Secondary | ICD-10-CM

## 2014-12-09 LAB — CBC
HEMATOCRIT: 45.3 % (ref 36.0–46.0)
Hemoglobin: 15.2 g/dL — ABNORMAL HIGH (ref 12.0–15.0)
MCH: 31.9 pg (ref 26.0–34.0)
MCHC: 33.6 g/dL (ref 30.0–36.0)
MCV: 95.2 fL (ref 78.0–100.0)
PLATELETS: 271 10*3/uL (ref 150–400)
RBC: 4.76 MIL/uL (ref 3.87–5.11)
RDW: 16.1 % — ABNORMAL HIGH (ref 11.5–15.5)
WBC: 6.1 10*3/uL (ref 4.0–10.5)

## 2014-12-09 LAB — COMPREHENSIVE METABOLIC PANEL
ALT: 19 U/L (ref 14–54)
AST: 65 U/L — AB (ref 15–41)
Albumin: 3.6 g/dL (ref 3.5–5.0)
Alkaline Phosphatase: 60 U/L (ref 38–126)
Anion gap: 15 (ref 5–15)
BUN: 11 mg/dL (ref 6–20)
CHLORIDE: 102 mmol/L (ref 101–111)
CO2: 24 mmol/L (ref 22–32)
CREATININE: 0.74 mg/dL (ref 0.44–1.00)
Calcium: 9.6 mg/dL (ref 8.9–10.3)
GFR calc Af Amer: >60 mL/min (ref >60)
Glucose, Bld: 117 mg/dL — ABNORMAL HIGH (ref 65–99)
Potassium: 3.5 mmol/L (ref 3.5–5.1)
Sodium: 141 mmol/L (ref 135–145)
Total Bilirubin: 1.7 mg/dL — ABNORMAL HIGH (ref 0.3–1.2)
Total Protein: 7.8 g/dL (ref 6.5–8.1)

## 2014-12-09 MED ORDER — FENTANYL 12 MCG/HR TD PT72: 12.5000 ug | MEDICATED_PATCH | TRANSDERMAL | Status: DC

## 2014-12-09 MED ORDER — SODIUM CHLORIDE 0.9 % IV SOLN
Freq: Once | INTRAVENOUS | Status: AC
  Administered 2014-12-09: 12:00:00 via INTRAVENOUS

## 2014-12-09 MED ORDER — MIRTAZAPINE 15 MG PO TABS: 15.0000 mg | ORAL_TABLET | Freq: Every day | ORAL | Status: DC

## 2014-12-09 MED ORDER — MIRTAZAPINE 15 MG PO TABS: 15.0000 mg | ORAL_TABLET | Freq: Every day | ORAL | Status: AC

## 2014-12-09 MED ORDER — MORPHINE SULFATE (PF) 2 MG/ML IV SOLN
2.0000 mg | Freq: Once | INTRAVENOUS | Status: AC
  Administered 2014-12-09: 2 mg via INTRAVENOUS
  Filled 2014-12-09: qty 1

## 2014-12-09 MED ORDER — PROCHLORPERAZINE MALEATE 10 MG PO TABS: 10.0000 mg | ORAL_TABLET | Freq: Four times a day (QID) | ORAL | Status: AC | PRN

## 2014-12-09 MED ORDER — SODIUM CHLORIDE 0.9 % IV SOLN
8.0000 mg | Freq: Once | INTRAVENOUS | Status: AC
  Administered 2014-12-09: 8 mg via INTRAVENOUS
  Filled 2014-12-09: qty 4

## 2014-12-09 NOTE — Progress Notes (Signed)
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 Burr Medico  Procedure: Pt received 1000cc normal saline, also antiemetic and morphine  Condition during procedure: Pt tolerated well  Condition post procedure: Pt alert, oriented and ambulatory

## 2014-12-09 NOTE — Telephone Encounter (Signed)
per pof to sch pt appt-gave pt copy of avs-per Dr Burr Medico ok to use 12

## 2014-12-09 NOTE — Progress Notes (Signed)
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 CT chest was negative.    02/26/2014 Pathology Results FNA of pancreatic mass showed adenocarcinoma    03/22/2014 Initial Diagnosis Pancreatic adenocarcinoma   04/08/2014 Surgery robotic converted to hand-assisted laparoscopic distal pancreatectomy, splenectomy, partial colectomy, and partial removal of the left renal capsule for pancreatic adenocarcinoma. Surgical margins were negative. pT3N0   04/08/2014 - 05/04/2014 Hospital Admission admitted for pancreatic surgery, complicated with abdominal abcess, which was drained and treated with abx for pseudomonas infection, difficulty with eating and ambulatory, malnutrition, was discharged to rehab     04/08/2014 Pathology Results pT3N0, moderately differentiated invasive adenocarcinoma, spanning 4.5 cm, invading colonic submucosa. Lymphovascular invasion is identified, perineural invasion is identified. 10 lymph nodes were negative. Surgical margins were negative.   05/09/2014 - 05/21/2014 Hospital Admission Pt was admited for chest and abdominal pain, found to have left side pleural effusion. thoracentesis  done by IR with 521ml fluids removed, cytology (-) malignant cells   10/02/2014 Imaging CT CAP showed a two new soft tissue versus cystic structures in the peripancreatic region, decresased left pleural effusion    11/05/2014 Pathology Results EGD/EUS needle biopsy of the soft tissue in the peripancreatic region showed adenocarcinoma     HISTORY OF PRESENTING ILLNESS:  [name] 69 y.o. female is here because of her recurrent DVT and incidental discovered pancreatic mass.  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 She had IVC filter placed back then and put on coumadin. She had second episode of right leg DVT when she was on coumadin in 2009, and continued coumadin until one year ago when she had trouble for monitoring her INR. She was then switched to Aspirin 81mg  dialy. 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 CT abdomen was ordered to further evaluate the DVT and abdominal pain on 12/22/2013, which showed thrombus in IVC and right common iliac vein below IVC. CT scan also showed a 2.8 x 2.5 cm mass within the tail of pancreas, this was confirmed by the MRI abdomen, with probably encasement of the splenic vessels. No significant abdominal adenopathy. CT of the chest was negative for distant metastasis. She was referred to see GI Dr.Hung and will have EGD/EUS for pancreatic mass biopsy in a week.   She has been having abdominal pain after eating in the pat few month, along with diarrhea and constipation, which has been chronic since her cholecystectomy in 199. Her appetite  is fare, and weight is stable. She also has tremor since teenager, and got worse lately, along with gait instaedyness, was seen by neurologist Dr. Carles Collet. She was put on primidone  which has help quite bit.   She had both knee surgery and was using a crane until recently when she was diagnosed with DVT and she has been using a walker. She has been having some gait instability lately, she was referred by Dr. Carles Collet and MRI of brain showed mild chronic small vessel ischemia disease and cerebral atrophy.  CURRENT THERAPY: supportive care,  palliative radiation 12/06/14-12/17/14   INTERIM HISTORY [name] returns for follow-up with her daughter. She has started radiation 3 days ago, she has worsening nausea, takes dissolvable Zofran 3 times a day, which helps but her nausea is still not well controlled. She sometimes cannot keep medications down due to the nausea, she has missed several OxyContin dose. She does take oxycodone as needed every 4-6 hours. The pain is slightly better, but not well controlled. She is eating little, not drinking enough fluids. Sleeps a lot at home.  MEDICAL HISTORY:  Past Medical History  Diagnosis Date  . Glaucoma   . Clotting disorder ([place])   . DVT (deep venous thrombosis) ([facility])     LEFT  . GERD (gastroesophageal reflux disease)   . Hypercholesterolemia   . Essential tremor   . Diverticulosis   . Arthritis     knees and back  . Hypertension   . Colon polyps   . UTI (lower urinary tract infection)   . Pulmonary embolism ([place])     5 yrs ago. Lovenox started about 1 yr ago.  . Complication of anesthesia     slow to wake up   . Peripheral neuropathy ([facility])     feet bilaterally  . Cancer [facility])     Pancreatic- Surgery 3'16- Dr. Purcell Mouton)- no other tx.. Dr. Jahna Liebert([facility]).  . Allergy     sulfa antibiotics  . Pancreas carcinoma ([place]) 11/05/14    adenocarcinoma    SURGICAL HISTORY: Past Surgical History  Procedure Laterality Date  . Cholecystectomy    . Abdominal hysterectomy    . Knee surgery Bilateral     scope  . [place] filter    . Back surgery    . Eus N/A 02/26/2014    Procedure: UPPER ENDOSCOPIC ULTRASOUND (EUS) LINEAR;   Surgeon: Beryle Beams, MD;  Location: [facility];  Service: Endoscopy;  Laterality: N/A;  . Fine needle aspiration N/A 02/26/2014    Procedure: FINE NEEDLE ASPIRATION (FNA) LINEAR;  Surgeon: Beryle Beams, MD;  Location: [facility];  Service: Endoscopy;  Laterality: N/A;  . Breast lumpectomy Right   . Spine surgery    . Xi robotic assisted laparoscopic distal pancreatectomy N/A 04/08/2014    Procedure: XI ROBOTIC ASSISTED CONVERTED LAPAROSCOPIC HAND ASSITED DISTAL PANCREATECTOMY/SPLENECTOMY WITH PARTIAL COLECTOMY;  Surgeon: [name], MD;  Location: [facility];  Service: General;  Laterality: N/A;  . Colon surgery    . Diagnostic laparoscopy    . Cataract extraction      left eye  . Eus N/A 11/05/2014    Procedure: FULL UPPER ENDOSCOPIC ULTRASOUND (EUS) RADIAL;  Surgeon: Carol Ada, MD;  Location: [facility];  Service: Endoscopy;  Laterality: N/A;  . Fine needle aspiration N/A 11/05/2014    Procedure: FINE NEEDLE ASPIRATION (FNA) LINEAR;  Surgeon: Carol Ada, MD;  Location: [facility];  Service: Endoscopy;  Laterality: N/A;    SOCIAL HISTORY: History   Social  History  . Marital Status: Divorced    Spouse Name: N/A    Number of Children: 3  . Years of Education: N/A   Occupational History   She used to work for [employer], retired now    Social History Main Topics  . Smoking status: Current Every Day Smoker -- 0.50 packs/day, for 40 years     Types: Cigarettes  . Smokeless tobacco: Not on file  . Alcohol Use: 0.0 oz/week, social drinker     0 Not specified per week     Comment: occ- glass of wine a week   . Drug Use: No  . Sexual Activity: Not on file   Other Topics Concern  . Not on file   Social History Narrative   Divorced, 3 children   Right handed   Associates degree   3 per week    FAMILY HISTORY: Family History  Problem Relation Age of Onset  . Diabetes Father 9  . Diabetes Maternal Grandmother   . Hypertension Mother   . Glaucoma  Sister   . Cataracts Maternal Grandmother   . Cancer Sister 44    Breast    ALLERGIES:  is allergic to sulfa antibiotics.  MEDICATIONS:  Current Outpatient Prescriptions  Medication Sig Dispense Refill  . atorvastatin (LIPITOR) 40 MG tablet Take 1 tablet (40 mg total) by mouth daily. 90 tablet 0  . atropine 1 % ophthalmic solution Place 1 drop into the left eye 2 (two) times daily.    . cromolyn (OPTICROM) 4 % ophthalmic solution Place 1 drop into both eyes 4 (four) times daily.     Marland Kitchen diltiazem (CARTIA XT) 120 MG 24 hr capsule Take 1 capsule (120 mg total) by mouth daily. 90 capsule 0  . DORZOLAMIDE HCL-TIMOLOL MAL OP Place 1 drop into the left eye 2 (two) times daily.     Marland Kitchen dronabinol (MARINOL) 2.5 MG capsule Take 1-2 capsules (2.5-5 mg total) by mouth 2 (two) times daily before a meal. 60 capsule 1  . enoxaparin (LOVENOX) 100 MG/ML injection Inject 1 mL (100 mg total) into the skin daily. 30 Syringe 3  . latanoprost (XALATAN) 0.005 % ophthalmic solution Place 1 drop into the right eye at bedtime.     . Multiple Vitamin (MULTIVITAMIN WITH MINERALS) TABS tablet Take 2 tablets by mouth daily.    . ondansetron (ZOFRAN-ODT) 4 MG disintegrating tablet DISSOLVE 1 TABLET(4 MG) ON THE TONGUE EVERY 8 HOURS AS NEEDED FOR NAUSEA OR VOMITING 20 tablet 0  . oxyCODONE 30 MG 12 hr tablet Take 30 mg by mouth every 12 (twelve) hours. 60 each 0  . Oxycodone HCl 10 MG TABS Take 1 tablet (10 mg total) by mouth every 4 (four) hours as needed. 90 tablet 0  . pantoprazole (PROTONIX) 40 MG tablet Take 1 tablet (40 mg total) by mouth 2 (two) times daily before a meal. 90 tablet 3  . prednisoLONE acetate (PRED FORTE) 1 % ophthalmic suspension Place 1 drop into the left eye 4 (four) times daily.     . primidone (MYSOLINE) 50 MG tablet TAKE 1 TABLET BY MOUTH EVERY NIGHT AT BEDTIME 90 tablet 0  . propranolol ER (INDERAL LA) 120 MG 24 hr capsule TAKE 1 CAPSULE(120 MG) BY MOUTH DAILY 30 capsule 0  . fentaNYL (DURAGESIC  - DOSED MCG/HR) 12 MCG/HR Place 1 patch (12.5 mcg total) onto the skin every 3 (three) days. 5 patch 0  . mirtazapine (REMERON) 15 MG tablet Take 1 tablet (  15 mg total) by mouth at bedtime. 90 tablet 1  . prochlorperazine (COMPAZINE) 10 MG tablet Take 1 tablet (10 mg total) by mouth every 6 (six) hours as needed. 30 tablet 3   Current Facility-Administered Medications  Medication Dose Route Frequency Provider Last Rate Last Dose  . cefTRIAXone (ROCEPHIN) injection 1 g  1 g Intramuscular Q24H Lucille Passy, MD   1 g at 09/11/14 1044    REVIEW OF SYSTEMS:   Constitutional: Denies fevers, chills or abnormal night sweats, (+) fatigue  Eyes: Denies blurriness of vision, double vision or watery eyes Ears, nose, mouth, throat, and face: Denies mucositis or sore throat Respiratory: Denies cough, dyspnea or wheezes Cardiovascular: Denies palpitation, chest discomfort or lower extremity swelling Gastrointestinal:  (+) nausea and vomiting, no heartburn or change in bowel habits. (+) worsening abdominal pain  Skin: Denies abnormal skin rashes Lymphatics: Denies new lymphadenopathy or easy bruising Neurological:Denies numbness, tingling or new weaknesses Behavioral/Psych: Mood is stable, no new changes  Extremities: Bilateral lower extremity edema and leg pain, left more than right. All other systems were reviewed with the patient and are negative.  PHYSICAL EXAMINATION: ECOG PERFORMANCE STATUS: 3 - Symptomatic, >50% confined to bed  Filed Vitals:   12/09/14 1006  BP: 108/76  Pulse: 106  Temp: 98.2 F (36.8 C)  Resp: 20   Filed Weights   12/09/14 1006  Weight: 139 lb 4.8 oz (63.186 kg)    GENERAL:alert, no distress and comfortable, sitting in wheelchair.  SKIN: skin color, texture, turgor are normal, no rashes. She has some bruises on her abdomen at the Lovenox injection site. She also has a 2 cm subcutaneous nodule on the right flank area, likely a hematoma. EYES: normal, conjunctiva are  pink and non-injected, sclera clear OROPHARYNX:no exudate, no erythema and lips, buccal mucosa, and tongue normal  NECK: supple, thyroid normal size, non-tender, without nodularity LYMPH:  no palpable lymphadenopathy in the cervical, axillary or inguinal LUNGS: clear to auscultation and percussion with normal breathing effort HEART: regular rate & rhythm and no murmurs and no lower extremity edema ABDOMEN:abdomen soft, mild tenderness at mid and low abdomen, normal bowel sounds Musculoskeletal:no cyanosis of digits and no clubbing  PSYCH: alert & oriented x 3 with fluent speech NEURO: no focal motor/sensory deficits Ext: (+) Bilateral Ankle swelling  LABORATORY DATA:  I have reviewed the data as listed Lab Results  Component Value Date   WBC 6.1 12/09/2014   HGB 15.2* 12/09/2014   HCT 45.3 12/09/2014   MCV 95.2 12/09/2014   PLT 271 12/09/2014   CMP Latest Ref Rng 12/09/2014 11/17/2014 10/02/2014  Glucose 65 - 99 mg/dL 117(H) 114 111(H)  BUN 6 - 20 mg/dL 11 8.7 7  Creatinine 0.44 - 1.00 mg/dL 0.74 0.8 0.76  Sodium 135 - 145 mmol/L 141 140 136  Potassium 3.5 - 5.1 mmol/L 3.5 4.0 2.8(L)  Chloride 101 - 111 mmol/L 102 - 96(L)  CO2 22 - 32 mmol/L 24 28 26   Calcium 8.9 - 10.3 mg/dL 9.6 10.1 9.4  Total Protein 6.5 - 8.1 g/dL 7.8 7.0 7.6  Total Bilirubin 0.3 - 1.2 mg/dL 1.7(H) 0.40 0.7  Alkaline Phos 38 - 126 U/L 60 65 62  AST 15 - 41 U/L 65(H) 43(H) 68(H)  ALT 14 - 54 U/L 19 18 21      PATHOLOGY REPORT 04/08/2014 REASON FOR ADDENDUM, AMENDMENT OR CORRECTION: [id number]: AMENDMENT: Amendment issued to report additional gross information in part #2. ds 04/14/14 04:55:45 PM FINAL DIAGNOSIS Diagnosis  04/08/2014 1. Colon, segmental resection, transverse - PANCREATIC ADENOCARCINOMA INVADING COLONIC SUBMUCOSA. - LYMPHOVASCULAR INVASION IS IDENTIFIED. - THE COLONIC RESECTION MARGINS ARE NEGATIVE FOR ADENOCARCINOMA. 2. Pancreas, resection - INVASIVE ADENOCARCINOMA, MODERATELY  DIFFERENTIATED, SPANNING 4.5 CM. - LYMPHOVASCULAR INVASION IS IDENTIFIED. - PERINEURAL INVASION IS IDENTIFIED. - ADENOCARCINOMA IS BROADLY PRESENT AT T 10 mg 1 negative. Surgical margins were negative. HE CLINICAL COLONIC MARGIN OF SPECIMEN #2. - ADENOCARCINOMA IS ADHERENT TO THE RENAL CAPSULE, BUT DOES NOT INVOLVE RENAL PARENCHYMA. - ADENOCARCINOMA IS ADHERENT TO THE SPLENIC CAPSULE, BUT DOES NOT INVOLVE SPLENIC PARENCHYMA. - THE PANCREATIC PARENCHYMAL RESECTION MARGIN IS NEGATIVE FOR ADENOCARCINOMA. - THERE IS NO EVIDENCE OF CARCINOMA IN 10 OF 10 LYMPH NODES (0/10). - SEE ONCOLOGY TABLE BELOW. Microscopic Comment 2. PANCREAS (EXOCRINE): Procedure: Distal pancreatectomy with adherent renal capsule and portion of spleen and transverse colon resection. Tumor Site: Distal pancreatic tail. Tumor Size: 4.5 cm (gross measurement). Histologic Type: Adenocarcinoma. Histologic Grade (ductal carcinoma only): G2: Moderately differentiated Microscopic Tumor Extension: Adenocarcinoma involves colonic submucosa, renal capsule, and splenic capsule. Margins: The pancreatic parenchymal margin is negative for adenocarcinoma. Distance of invasive carcinoma from closest margin: 5.2 cm to the pancreatic parenchymal margin, see comment. Carcinoma in situ / high-grade dysplasia: Not identified. Treatment Effect: N/A. 1 of 3 Amended copy Amended FINAL for Sturgell, Elyse A [id number]) Microscopic Comment(continued) Lymph-Vascular Invasion: Present, diffuse Perineural Invasion: Present, diffuse Lymph nodes: number examined 10; number positive: 0. Pathologic Staging: pT3, pN0. Additional Pathologic Findings: No significant findings. Ancillary Studies: N/A. Comment(s): There is a primary 4.5 cm moderately differentiated pancreatic adenocarcinoma present in the tail of the pancreas. There is direct extension of this tumor to the submucosa of the transverse colon (specimen #1). In addition, adenocarcinoma  extends to and involves the renal capsule and splenic capsule. Despite four lymph nodes being negative for parenchymal metastases, one of the four lymph nodes does show perinodal deposits of adenocarcinoma. The adenocarcinoma present in the submucosa of the transverse colon, renal capsule, splenic capsule, and perinodal soft tissue is interpreted to represent direct extension of the primary tumor, rather than metastatic sites and thus, the tumor is staged as above.  Comment There are malignant cells with prominent nucleili and intracytoplasmic vacuoles, arranged in acinar pattern. The overall morphologic findings favor a diagnosis of well differentiated adenocarcinoma.   Diagnosis 11/05/2014 FINE NEEDLE ASPIRATION, ENDOSCOPIC PANCREATIC TAIL (SPECIMEN 1 OF 1, COLLECTED ON 11/05/14): MALIGNANT CELLS PRESENT SEE COMMENT COMMENT: THE MALIGNANT CELLS ARE CONSISTENT WITH ADENOCARCINOMA. THE CASE WAS REVIEWED BY DR Tresa Moore, WHO CONCURS.   RADIOGRAPHIC STUDIES: I have personally reviewed the radiological images as listed and agreed with the findings in the report.  Ct Abdomen Pelvis W Contrast 10/02/2014    IMPRESSION: Foley catheter within thick-walled bladder, which may indicate infection/cystitis.  Two new soft tissue versus cystic structures in the peripancreatic region since 06/15/2014. Although these may represent pseudocysts, recurrent neoplasm is not entirely excluded and close interval follow-up or further evaluation is recommended.  Decreasing left pleural effusion which now appears slightly loculated. CT chest 10/07/2014 IMPRESSION: 1. Stable small left pleural effusion with associated passive atelectasis. No new lung nodule or other significant new findings in the chest. Stable small left internal mammary node warrants observation.   ASSESSMENT & PLAN:  69 year old female, with past medical history of recurrent bilateral lower extremity DVTs, presented with acute extensive  left lower extremity DVT and thrombus in the IVC and the right, iliac vein, and chronic DVT in the right popliteal vein. She  was also found to have a pancreatic tail mass on the CT scan.  1. Pancreatic adenocarcinoma, pT3 N0 M0, stage IIA, local recurrence in 09/2014  - Her restaging CT scan from 10/02/2014 showed two small soft tissue vs cystic lesion in pancrease. -I discussed her biopsy results with patient and her daughter in details. Unfortunately she has local recurrence I'll check with her surgeon Dr. Barry Dienes, but unlikely she will offer more surgery.  -We reviewed her case in our tumor board. Unfortunately this is likely incurable disease.  -She was seen by radiation oncologist Dr. Lisbeth Renshaw, who offered palliative radiation for pain control, she is going to finished next Friday 11/18. -Given her rapid deterioration, she is unlikely to be a candidate for systemic chemotherapy after radiation. -We discussed the goal of care with her and her daughter today, they agree with palliative approach to preserve her quality of life. -I discussed the option of home palliative care and hospice, she agrees with home care, I will set up for her with IVF 1L  Weekly at home and as needed  2. Abdominal pain due to local recurrent pancreatic cancer -her pain is poorly controlled  -I recommended her to increase OxyContin to 30 mg every 12 hours, but she has had difficulty to take oral meds due to nausea, I will switch her to fentanyl patch 12.50mcg/hr, I instructed her to reach over with OxyContin for one day -I also refilled her oxycodone   3. Recurrent DVT  -I reviewed her lab results of prothrombin gene mutation, factor V Leyden mutation, and antithrombin III, which were all negative.  -Giving her recurrent unprovoked DVT, I recommend anticoagulation indefinitely. -she has some pain at injection sites, we discussed other A/C options, such as coumadin and xarelto, due to the cost issue, she wants to remain on  lovenox   -continue lovenox 100 mg once daily. Her renal function is normal  4. Nausea, Anorexia, weight loss  -Given her poor by mouth intake and nausea, I'll give her IV fluids with antiemesis and IV morphine today. -started Marinol 2.5 mg twice a day, we'll titrate her dose in the next few weeks -I also called in Compazine today, to use alternatively with Zofran -I encouraged her to continue nutrition supplement  5. Code status -The discussed with patient and her daughter. I recommend DO NOT RESUSCITATE and DO NOT INTUBATE, she became emotional, agrees to think about it.  Plan:  -IVF today with iv zofran and morphine  -Set up home care for IV fluids at home -I called in mirtazapine, Compazine, and give her a prescription of fentanyl patch today -I'll see her back next Friday.  All questions were answered. The patient knows to call the clinic with any problems, questions or concerns. I spent 20 minutes counseling the patient face to face. The total time spent in the appointment was 25 minutes and more than 50% was on counseling.     Truitt Merle, MD 12/09/2014   10:20 PM

## 2014-12-09 NOTE — Telephone Encounter (Signed)
Verbal order given to RN/[facility] for zofran 8 mg IV & morphine 2 mg IV per Dr Burr Medico.  Order repeated & verified with Dr Burr Medico.

## 2014-12-09 NOTE — Progress Notes (Signed)
[place] Psychosocial Distress Screening Clinical Social Work  Clinical Social Work was referred by distress screening protocol.  The patient scored a 8 on the Psychosocial Distress Thermometer which indicates severe distress. Clinical Social Worker phoned pt to assess for distress and other psychosocial needs. CSW left message due to missing pt at her appointment today.   ONCBCN DISTRESS SCREENING 11/29/2014  Screening Type Initial Screening  Distress experienced in past week (1-10) 8  Emotional problem type Adjusting to illness  Spiritual/Religous concerns type Facing my mortality  Physical Problem type Pain;Nausea/vomiting;Sleep/insomnia;Getting around;Bathing/dressing;Loss of appetitie;Constipation/diarrhea;Tingling hands/feet;Skin dry/itchy  Physician notified of physical symptoms Yes  Referral to clinical social work Yes  Referral to dietition Yes    Clinical Social Worker follow up needed: Yes.    If yes, follow up plan: CSW awaits return call Loren Racer, [place]  [facility] Phone: [phone] Fax: [phone]

## 2014-12-09 NOTE — Progress Notes (Signed)
Oncology Nurse Navigator Documentation  Oncology Nurse Navigator Flowsheets 12/09/2014  Navigator Encounter Type Routine F/U  Patient Visit Type Medonc  Treatment Phase Treatment--Radiation therapy #4  Barriers/Navigation Needs Family concerns;Education  Education Pain/ Symptom Management  Interventions Referrals;Education Method  Referrals Home Health--nursing for IVF or labs/ PT/OT for safety in home and strength and gail training (Daughter reports a fall on 11/27/14)  Education Method Verbal;Written;Teach-back  Time Spent with Patient 45  Explained the taper of Oxycontin with addition of Fentanyl patch. Addition of compazine and remeron. Contacted radiation oncology and their schedule does not allow them to treat her any sooner today. [facility] (Dr. Lyndal [name] nurse) daughter's request to report urine from cath is cloudy and smells. Transported patient to [place] via w/c for IVF and lab work-confirmed w/nurse that orders were received for CBC and Cmet. Provided positive reinforcement to daughter, [name]. Mose on how well she is caring for her mother. Suggested daughter obtain baby monitor to help with communication between her and her mom when she is not in the same room.

## 2014-12-10 ENCOUNTER — Encounter (HOSPITAL_COMMUNITY): Payer: Self-pay

## 2014-12-10 ENCOUNTER — Ambulatory Visit: Admission: RE | Admit: 2014-12-10 | Payer: Medicare Other | Source: Ambulatory Visit | Admitting: Radiation Oncology

## 2014-12-10 ENCOUNTER — Inpatient Hospital Stay (HOSPITAL_COMMUNITY)
Admission: EM | Admit: 2014-12-10 | Discharge: 2014-12-14 | DRG: 699 | Disposition: A | Discharge status: Home / Self Care | Payer: Medicare Other | Attending: Internal Medicine | Admitting: Internal Medicine

## 2014-12-10 ENCOUNTER — Ambulatory Visit: Payer: Medicare Other

## 2014-12-10 DIAGNOSIS — Z803 Family history of malignant neoplasm of breast: Secondary | ICD-10-CM | POA: Diagnosis not present

## 2014-12-10 DIAGNOSIS — D638 Anemia in other chronic diseases classified elsewhere: Secondary | ICD-10-CM | POA: Diagnosis present

## 2014-12-10 DIAGNOSIS — Z90411 Acquired partial absence of pancreas: Secondary | ICD-10-CM | POA: Diagnosis not present

## 2014-12-10 DIAGNOSIS — E860 Dehydration: Secondary | ICD-10-CM | POA: Diagnosis not present

## 2014-12-10 DIAGNOSIS — M170 Bilateral primary osteoarthritis of knee: Secondary | ICD-10-CM | POA: Diagnosis not present

## 2014-12-10 DIAGNOSIS — E7800 Pure hypercholesterolemia, unspecified: Secondary | ICD-10-CM | POA: Diagnosis present

## 2014-12-10 DIAGNOSIS — Z8249 Family history of ischemic heart disease and other diseases of the circulatory system: Secondary | ICD-10-CM | POA: Diagnosis not present

## 2014-12-10 DIAGNOSIS — Z882 Allergy status to sulfonamides status: Secondary | ICD-10-CM | POA: Diagnosis not present

## 2014-12-10 DIAGNOSIS — T83518A Infection and inflammatory reaction due to other urinary catheter, initial encounter: Secondary | ICD-10-CM | POA: Diagnosis present

## 2014-12-10 DIAGNOSIS — R112 Nausea with vomiting, unspecified: Secondary | ICD-10-CM | POA: Diagnosis present

## 2014-12-10 DIAGNOSIS — M479 Spondylosis, unspecified: Secondary | ICD-10-CM | POA: Diagnosis not present

## 2014-12-10 DIAGNOSIS — Z6820 Body mass index (BMI) 20.0-20.9, adult: Secondary | ICD-10-CM | POA: Diagnosis not present

## 2014-12-10 DIAGNOSIS — B370 Candidal stomatitis: Secondary | ICD-10-CM | POA: Diagnosis present

## 2014-12-10 DIAGNOSIS — I10 Essential (primary) hypertension: Secondary | ICD-10-CM | POA: Diagnosis present

## 2014-12-10 DIAGNOSIS — Z800 Family history of malignant neoplasm of digestive organs: Secondary | ICD-10-CM

## 2014-12-10 DIAGNOSIS — G629 Polyneuropathy, unspecified: Secondary | ICD-10-CM | POA: Diagnosis present

## 2014-12-10 DIAGNOSIS — Z79899 Other long term (current) drug therapy: Secondary | ICD-10-CM | POA: Diagnosis not present

## 2014-12-10 DIAGNOSIS — C252 Malignant neoplasm of tail of pancreas: Secondary | ICD-10-CM | POA: Diagnosis present

## 2014-12-10 DIAGNOSIS — R319 Hematuria, unspecified: Secondary | ICD-10-CM | POA: Diagnosis present

## 2014-12-10 DIAGNOSIS — T8369XA Infection and inflammatory reaction due to other prosthetic device, implant and graft in genital tract, initial encounter: Secondary | ICD-10-CM

## 2014-12-10 DIAGNOSIS — Z86010 Personal history of colonic polyps: Secondary | ICD-10-CM | POA: Diagnosis not present

## 2014-12-10 DIAGNOSIS — I82409 Acute embolism and thrombosis of unspecified deep veins of unspecified lower extremity: Secondary | ICD-10-CM | POA: Diagnosis present

## 2014-12-10 DIAGNOSIS — Z79891 Long term (current) use of opiate analgesic: Secondary | ICD-10-CM | POA: Diagnosis not present

## 2014-12-10 DIAGNOSIS — G250 Essential tremor: Secondary | ICD-10-CM | POA: Diagnosis present

## 2014-12-10 DIAGNOSIS — N319 Neuromuscular dysfunction of bladder, unspecified: Secondary | ICD-10-CM | POA: Diagnosis present

## 2014-12-10 DIAGNOSIS — H409 Unspecified glaucoma: Secondary | ICD-10-CM | POA: Diagnosis present

## 2014-12-10 DIAGNOSIS — N390 Urinary tract infection, site not specified: Secondary | ICD-10-CM

## 2014-12-10 DIAGNOSIS — T83511D Infection and inflammatory reaction due to indwelling urethral catheter, subsequent encounter: Secondary | ICD-10-CM | POA: Diagnosis not present

## 2014-12-10 DIAGNOSIS — Y846 Urinary catheterization as the cause of abnormal reaction of the patient, or of later complication, without mention of misadventure at the time of the procedure: Secondary | ICD-10-CM | POA: Diagnosis not present

## 2014-12-10 DIAGNOSIS — Z86711 Personal history of pulmonary embolism: Secondary | ICD-10-CM | POA: Diagnosis not present

## 2014-12-10 DIAGNOSIS — Z833 Family history of diabetes mellitus: Secondary | ICD-10-CM

## 2014-12-10 DIAGNOSIS — E876 Hypokalemia: Secondary | ICD-10-CM | POA: Diagnosis not present

## 2014-12-10 DIAGNOSIS — K219 Gastro-esophageal reflux disease without esophagitis: Secondary | ICD-10-CM | POA: Diagnosis not present

## 2014-12-10 DIAGNOSIS — E440 Moderate protein-calorie malnutrition: Secondary | ICD-10-CM | POA: Diagnosis present

## 2014-12-10 DIAGNOSIS — Z23 Encounter for immunization: Secondary | ICD-10-CM | POA: Diagnosis not present

## 2014-12-10 DIAGNOSIS — E785 Hyperlipidemia, unspecified: Secondary | ICD-10-CM | POA: Diagnosis present

## 2014-12-10 DIAGNOSIS — T83511A Infection and inflammatory reaction due to indwelling urethral catheter, initial encounter: Secondary | ICD-10-CM

## 2014-12-10 DIAGNOSIS — R824 Acetonuria: Secondary | ICD-10-CM

## 2014-12-10 DIAGNOSIS — Z7901 Long term (current) use of anticoagulants: Secondary | ICD-10-CM | POA: Diagnosis not present

## 2014-12-10 LAB — COMPREHENSIVE METABOLIC PANEL
ALBUMIN: 3.2 g/dL — AB (ref 3.5–5.0)
ALK PHOS: 52 U/L (ref 38–126)
ALT: 12 U/L — AB (ref 14–54)
AST: 51 U/L — AB (ref 15–41)
Anion gap: 12 (ref 5–15)
BILIRUBIN TOTAL: 1.2 mg/dL (ref 0.3–1.2)
BUN: 10 mg/dL (ref 6–20)
CO2: 23 mmol/L (ref 22–32)
CREATININE: 0.7 mg/dL (ref 0.44–1.00)
Calcium: 8.8 mg/dL — ABNORMAL LOW (ref 8.9–10.3)
Chloride: 109 mmol/L (ref 101–111)
GFR calc Af Amer: >60 mL/min (ref >60)
GFR calc non Af Amer: >60 mL/min (ref >60)
GLUCOSE: 95 mg/dL (ref 65–99)
Potassium: 3.9 mmol/L (ref 3.5–5.1)
Sodium: 144 mmol/L (ref 135–145)
Total Protein: 6.8 g/dL (ref 6.5–8.1)

## 2014-12-10 LAB — CBC WITH DIFFERENTIAL/PLATELET
Basophils Absolute: 0 10*3/uL (ref 0.0–0.1)
Basophils Relative: 0 %
EOS PCT: 1 %
Eosinophils Absolute: 0.1 10*3/uL (ref 0.0–0.7)
HCT: 44 % (ref 36.0–46.0)
HEMOGLOBIN: 14.4 g/dL (ref 12.0–15.0)
LYMPHS ABS: 1 10*3/uL (ref 0.7–4.0)
LYMPHS PCT: 16 %
MCH: 31.6 pg (ref 26.0–34.0)
MCHC: 32.7 g/dL (ref 30.0–36.0)
MCV: 96.5 fL (ref 78.0–100.0)
MONOS PCT: 14 %
Monocytes Absolute: 0.9 10*3/uL (ref 0.1–1.0)
Neutro Abs: 4.5 10*3/uL (ref 1.7–7.7)
Neutrophils Relative %: 69 %
Platelets: 284 10*3/uL (ref 150–400)
RBC: 4.56 MIL/uL (ref 3.87–5.11)
RDW: 16.8 % — ABNORMAL HIGH (ref 11.5–15.5)
WBC: 6.5 10*3/uL (ref 4.0–10.5)

## 2014-12-10 LAB — LIPASE, BLOOD: Lipase: 20 U/L (ref 11–51)

## 2014-12-10 LAB — URINALYSIS, ROUTINE W REFLEX MICROSCOPIC
GLUCOSE, UA: NEGATIVE mg/dL
Ketones, ur: >80 mg/dL — AB
NITRITE: POSITIVE — AB
PH: 6 (ref 5.0–8.0)
Protein, ur: >300 mg/dL — AB
SPECIFIC GRAVITY, URINE: 1.025 (ref 1.005–1.030)
Urobilinogen, UA: 1 mg/dL (ref 0.0–1.0)

## 2014-12-10 LAB — URINE MICROSCOPIC-ADD ON

## 2014-12-10 LAB — I-STAT CG4 LACTIC ACID, ED: Lactic Acid, Venous: 1.83 mmol/L (ref 0.5–2.0)

## 2014-12-10 MED ORDER — FLUCONAZOLE IN SODIUM CHLORIDE 200-0.9 MG/100ML-% IV SOLN
200.0000 mg | Freq: Once | INTRAVENOUS | Status: AC
  Administered 2014-12-10: 200 mg via INTRAVENOUS
  Filled 2014-12-10: qty 100

## 2014-12-10 MED ORDER — FENTANYL CITRATE (PF) 100 MCG/2ML IJ SOLN
50.0000 ug | Freq: Once | INTRAMUSCULAR | Status: AC
  Administered 2014-12-10: 50 ug via INTRAVENOUS
  Filled 2014-12-10: qty 2

## 2014-12-10 MED ORDER — ATROPINE SULFATE 1 % OP SOLN
1.0000 [drp] | Freq: Two times a day (BID) | OPHTHALMIC | Status: DC
  Administered 2014-12-10 – 2014-12-14 (×8): 1 [drp] via OPHTHALMIC
  Filled 2014-12-10: qty 2

## 2014-12-10 MED ORDER — PROMETHAZINE HCL 25 MG/ML IJ SOLN
6.2500 mg | Freq: Once | INTRAMUSCULAR | Status: AC
  Administered 2014-12-10: 6.25 mg via INTRAVENOUS
  Filled 2014-12-10: qty 1

## 2014-12-10 MED ORDER — OXYCODONE HCL 5 MG PO TABS
10.0000 mg | ORAL_TABLET | ORAL | Status: DC | PRN
  Administered 2014-12-11 – 2014-12-14 (×4): 10 mg via ORAL
  Filled 2014-12-10 (×4): qty 2

## 2014-12-10 MED ORDER — PRIMIDONE 50 MG PO TABS
50.0000 mg | ORAL_TABLET | Freq: Every day | ORAL | Status: DC
  Administered 2014-12-10 – 2014-12-14 (×4): 50 mg via ORAL
  Filled 2014-12-10 (×4): qty 1

## 2014-12-10 MED ORDER — PROPRANOLOL HCL ER 120 MG PO CP24
120.0000 mg | ORAL_CAPSULE | Freq: Every day | ORAL | Status: DC
  Administered 2014-12-11 – 2014-12-14 (×4): 120 mg via ORAL
  Filled 2014-12-10 (×4): qty 1

## 2014-12-10 MED ORDER — PANTOPRAZOLE SODIUM 40 MG PO TBEC
40.0000 mg | DELAYED_RELEASE_TABLET | Freq: Two times a day (BID) | ORAL | Status: DC
  Administered 2014-12-11 – 2014-12-14 (×7): 40 mg via ORAL
  Filled 2014-12-10 (×11): qty 1

## 2014-12-10 MED ORDER — MIRTAZAPINE 15 MG PO TABS
15.0000 mg | ORAL_TABLET | Freq: Every day | ORAL | Status: DC
  Administered 2014-12-10 – 2014-12-14 (×4): 15 mg via ORAL
  Filled 2014-12-10 (×4): qty 1

## 2014-12-10 MED ORDER — SODIUM CHLORIDE 0.9 % IV SOLN
INTRAVENOUS | Status: DC
  Administered 2014-12-12 (×2): via INTRAVENOUS

## 2014-12-10 MED ORDER — CROMOLYN SODIUM 4 % OP SOLN
1.0000 [drp] | Freq: Four times a day (QID) | OPHTHALMIC | Status: DC
  Administered 2014-12-10 – 2014-12-14 (×13): 1 [drp] via OPHTHALMIC
  Filled 2014-12-10: qty 10

## 2014-12-10 MED ORDER — ONDANSETRON HCL 4 MG/2ML IJ SOLN: 4.0000 mg | Freq: Three times a day (TID) | INTRAMUSCULAR | Status: DC | PRN

## 2014-12-10 MED ORDER — DORZOLAMIDE HCL-TIMOLOL MAL 2-0.5 % OP SOLN
1.0000 [drp] | Freq: Two times a day (BID) | OPHTHALMIC | Status: DC
  Administered 2014-12-10 – 2014-12-14 (×8): 1 [drp] via OPHTHALMIC
  Filled 2014-12-10: qty 10

## 2014-12-10 MED ORDER — LATANOPROST 0.005 % OP SOLN
1.0000 [drp] | Freq: Every day | OPHTHALMIC | Status: DC
  Administered 2014-12-10 – 2014-12-14 (×4): 1 [drp] via OPHTHALMIC
  Filled 2014-12-10: qty 2.5

## 2014-12-10 MED ORDER — ONDANSETRON HCL 4 MG/2ML IJ SOLN: 4.0000 mg | Freq: Four times a day (QID) | INTRAMUSCULAR | Status: AC | PRN

## 2014-12-10 MED ORDER — MORPHINE SULFATE (PF) 4 MG/ML IV SOLN
4.0000 mg | Freq: Once | INTRAVENOUS | Status: AC
  Administered 2014-12-10: 4 mg via INTRAVENOUS
  Filled 2014-12-10: qty 1

## 2014-12-10 MED ORDER — HYDROMORPHONE HCL 1 MG/ML IJ SOLN
1.0000 mg | INTRAMUSCULAR | Status: AC | PRN
  Administered 2014-12-10 – 2014-12-11 (×2): 1 mg via INTRAVENOUS
  Filled 2014-12-10 (×2): qty 1

## 2014-12-10 MED ORDER — NYSTATIN 100000 UNIT/ML MT SUSP
5.0000 mL | Freq: Four times a day (QID) | OROMUCOSAL | Status: DC
  Administered 2014-12-10 – 2014-12-14 (×12): 500000 [IU] via ORAL
  Filled 2014-12-10 (×16): qty 5

## 2014-12-10 MED ORDER — ACETAMINOPHEN 650 MG RE SUPP: 650.0000 mg | Freq: Four times a day (QID) | RECTAL | Status: DC | PRN

## 2014-12-10 MED ORDER — SODIUM CHLORIDE 0.9 % IV SOLN
INTRAVENOUS | Status: AC
  Administered 2014-12-10: 20:00:00 via INTRAVENOUS

## 2014-12-10 MED ORDER — OXYCODONE HCL ER 10 MG PO T12A
30.0000 mg | EXTENDED_RELEASE_TABLET | Freq: Two times a day (BID) | ORAL | Status: DC
  Administered 2014-12-10 – 2014-12-14 (×8): 30 mg via ORAL
  Filled 2014-12-10 (×8): qty 3

## 2014-12-10 MED ORDER — PROCHLORPERAZINE MALEATE 10 MG PO TABS: 10.0000 mg | ORAL_TABLET | Freq: Four times a day (QID) | ORAL | Status: DC | PRN

## 2014-12-10 MED ORDER — INFLUENZA VAC SPLIT QUAD 0.5 ML IM SUSY
0.5000 mL | PREFILLED_SYRINGE | INTRAMUSCULAR | Status: AC
  Administered 2014-12-11: 0.5 mL via INTRAMUSCULAR
  Filled 2014-12-10 (×2): qty 0.5

## 2014-12-10 MED ORDER — ADULT MULTIVITAMIN W/MINERALS CH
2.0000 | ORAL_TABLET | Freq: Every day | ORAL | Status: DC
  Administered 2014-12-11 – 2014-12-14 (×4): 2 via ORAL
  Filled 2014-12-10 (×4): qty 2

## 2014-12-10 MED ORDER — SODIUM CHLORIDE 0.9 % IV BOLUS (SEPSIS)
1000.0000 mL | Freq: Once | INTRAVENOUS | Status: AC
  Administered 2014-12-10: 1000 mL via INTRAVENOUS

## 2014-12-10 MED ORDER — ATORVASTATIN CALCIUM 40 MG PO TABS
40.0000 mg | ORAL_TABLET | Freq: Every day | ORAL | Status: DC
  Administered 2014-12-11 – 2014-12-13 (×3): 40 mg via ORAL
  Filled 2014-12-10 (×5): qty 1

## 2014-12-10 MED ORDER — FENTANYL 12 MCG/HR TD PT72: 12.5000 ug | MEDICATED_PATCH | TRANSDERMAL | Status: DC

## 2014-12-10 MED ORDER — DEXTROSE 5 % IV SOLN
1.0000 g | Freq: Once | INTRAVENOUS | Status: DC
  Filled 2014-12-10: qty 10

## 2014-12-10 MED ORDER — DILTIAZEM HCL ER COATED BEADS 120 MG PO CP24
120.0000 mg | ORAL_CAPSULE | Freq: Every day | ORAL | Status: DC
  Administered 2014-12-11 – 2014-12-14 (×4): 120 mg via ORAL
  Filled 2014-12-10 (×4): qty 1

## 2014-12-10 MED ORDER — ENOXAPARIN SODIUM 100 MG/ML ~~LOC~~ SOLN
100.0000 mg | Freq: Every day | SUBCUTANEOUS | Status: DC
  Administered 2014-12-10 – 2014-12-14 (×4): 100 mg via SUBCUTANEOUS
  Filled 2014-12-10 (×4): qty 1

## 2014-12-10 MED ORDER — DRONABINOL 2.5 MG PO CAPS
2.5000 mg | ORAL_CAPSULE | Freq: Two times a day (BID) | ORAL | Status: DC
  Administered 2014-12-11 – 2014-12-13 (×6): 2.5 mg via ORAL
  Administered 2014-12-14: 5 mg via ORAL
  Filled 2014-12-10 (×3): qty 1
  Filled 2014-12-10: qty 2
  Filled 2014-12-10 (×3): qty 1

## 2014-12-10 MED ORDER — DEXTROSE 5 % IV SOLN
500.0000 mg | Freq: Three times a day (TID) | INTRAVENOUS | Status: DC
  Administered 2014-12-11 – 2014-12-14 (×10): 500 mg via INTRAVENOUS
  Filled 2014-12-10 (×12): qty 0.5

## 2014-12-10 MED ORDER — ONDANSETRON HCL 4 MG/2ML IJ SOLN
4.0000 mg | Freq: Once | INTRAMUSCULAR | Status: AC
  Administered 2014-12-10: 4 mg via INTRAVENOUS
  Filled 2014-12-10: qty 2

## 2014-12-10 MED ORDER — DEXTROSE 5 % IV SOLN
500.0000 mg | INTRAVENOUS | Status: AC
  Administered 2014-12-10: 500 mg via INTRAVENOUS
  Filled 2014-12-10: qty 0.5

## 2014-12-10 MED ORDER — PREDNISOLONE ACETATE 1 % OP SUSP
1.0000 [drp] | Freq: Four times a day (QID) | OPHTHALMIC | Status: DC
  Administered 2014-12-10 – 2014-12-14 (×13): 1 [drp] via OPHTHALMIC
  Filled 2014-12-10: qty 1

## 2014-12-10 MED ORDER — ACETAMINOPHEN 325 MG PO TABS: 650.0000 mg | ORAL_TABLET | Freq: Four times a day (QID) | ORAL | Status: DC | PRN

## 2014-12-10 MED ORDER — SODIUM CHLORIDE 0.9 % IV SOLN
Freq: Once | INTRAVENOUS | Status: AC
  Administered 2014-12-10: 16:00:00 via INTRAVENOUS

## 2014-12-10 NOTE — ED Notes (Signed)
Rocephin was wasted in trash since it was already mixed before order was changed

## 2014-12-10 NOTE — H&P (Signed)
[facility] History and Physical  [name] [id number] DOB: Mar 09, 1945 DOA: 12/10/2014  PCP: Mauricio Po, FNP   Chief Complaint: Urine odor, hematuria, nausea, vomiting  HPI: [name] is a 69 y.o. woman with a history of recurrent pancreatic cancer S/P distal pancreatectomy, splenectomy, and partial colectomy in March.  She is currently under going radiation therapy.  She has had at least one week of increased nausea and vomiting with decreased PO intake.  She has discussed this with her oncologist.  Within the past 24 hours, she has had new hematuria with increased urine odor.  Of note, she had a chronic indwelling foley catheter after her surgery in March, and a suprapubic catheter was ultimately placed a few months ago.  She has had catheter associated UTIs earlier this year (prior cultures grew Proteus, E coli), but this is her first time having signs/symptoms of UTI since having the suprapubic catheter.  No documented fever but she is having chills and sweats. Hospitalist asked to admit.  Of note, she also has history of recurrent DVT.  She is currently on lovenox injections.  Review of Systems: intermittent dysphagia.  Otherwise, 12 systems reviewed and negative except as stated in HPI.  Past Medical History  Diagnosis Date  . Glaucoma   . Clotting disorder ([place])   . DVT (deep venous thrombosis) (HCC)     LEFT  . GERD (gastroesophageal reflux disease)   . Hypercholesterolemia   . Essential tremor   . Diverticulosis   . Arthritis     knees and back  . Hypertension   . Colon polyps   . UTI (lower urinary tract infection)   . Pulmonary embolism ([place])     5 yrs ago. Lovenox started about 1 yr ago.  . Complication of anesthesia     slow to wake up   . Peripheral neuropathy (HCC)     feet bilaterally  . Cancer [facility])     Pancreatic- Surgery 3'16- Dr. Purcell Mouton)- no other tx.. Dr. Feng([facility]).  . Allergy     sulfa antibiotics  . 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 Cholecystectomy    . Abdominal hysterectomy    . Knee surgery Bilateral     scope  . Greenfield filter    . Back surgery    . Eus N/A 02/26/2014    Procedure: UPPER ENDOSCOPIC ULTRASOUND (EUS) LINEAR;  Surgeon: Beryle Beams, MD;  [facility];  Service: Endoscopy;  Laterality: N/A;  . Fine needle aspiration N/A 02/26/2014    Procedure: FINE NEEDLE ASPIRATION (FNA) LINEAR;  Surgeon: Beryle Beams, MD;  [facility];  Service: Endoscopy;  Laterality: N/A;  . Breast lumpectomy Right   . Spine surgery    . Xi robotic assisted laparoscopic distal pancreatectomy N/A 04/08/2014    Procedure: XI ROBOTIC ASSISTED CONVERTED LAPAROSCOPIC HAND ASSITED DISTAL PANCREATECTOMY/SPLENECTOMY WITH PARTIAL COLECTOMY;  Surgeon: [name], MD;  [facility]: [facility];  Service: General;  Laterality: N/A;  . Colon surgery    . Diagnostic laparoscopy    . Cataract extraction      left eye  . Eus N/A 11/05/2014    Procedure: FULL UPPER ENDOSCOPIC ULTRASOUND (EUS) RADIAL;  Surgeon: Carol Ada, MD;  [facility];  Service: Endoscopy;  Laterality: N/A;  . Fine needle aspiration N/A 11/05/2014    Procedure: FINE NEEDLE ASPIRATION (FNA) LINEAR;  Surgeon: Carol Ada, MD;  Location[facility];  Service: Endoscopy;  Laterality: N/A;   Social History:  Social History   Social History Narrative   Divorced, 3 children   Right handed   Associates degree   3 per week   Fun: Sleep, watch tv, reading   Denies any religious beliefs that effect health care    Allergies  Allergen Reactions  . Sulfa Antibiotics Swelling    Family History  Problem Relation Age of Onset  . Diabetes Father   . Cancer Father 34    prostate  . Hyperlipidemia Father   . Cataracts Maternal Grandmother   . Hypertension Mother   . Arthritis Mother   . Heart disease Mother   . Glaucoma Sister   . Cancer Sister 51     Breast  . Cancer Paternal Uncle     throat +cig  . Diabetes Paternal Grandmother    Prior to Admission medications   Medication Sig Start Date End Date Taking? Authorizing Provider  atorvastatin (LIPITOR) 40 MG tablet Take 1 tablet (40 mg total) by mouth daily. 05/05/14  Yes Gerlene Fee, NP  atropine 1 % ophthalmic solution Place 1 drop into the left eye 2 (two) times daily.   Yes Historical Provider, MD  cromolyn (OPTICROM) 4 % ophthalmic solution Place 1 drop into both eyes 4 (four) times daily.    Yes Historical Provider, MD  diltiazem (CARTIA XT) 120 MG 24 hr capsule Take 1 capsule (120 mg total) by mouth daily. 05/05/14  Yes Gerlene Fee, NP  DORZOLAMIDE HCL-TIMOLOL MAL OP Place 1 drop into the left eye 2 (two) times daily.    Yes Historical Provider, MD  dronabinol (MARINOL) 2.5 MG capsule Take 1-2 capsules (2.5-5 mg total) by mouth 2 (two) times daily before a meal. 12/01/14  Yes Truitt Merle, MD  enoxaparin (LOVENOX) 100 MG/ML injection Inject 1 mL (100 mg total) into the skin daily. 10/13/14  Yes Truitt Merle, MD  fentaNYL (DURAGESIC - DOSED MCG/HR) 12 MCG/HR Place 1 patch (12.5 mcg total) onto the skin every 3 (three) days. 12/09/14  Yes Truitt Merle, MD  latanoprost (XALATAN) 0.005 % ophthalmic solution Place 1 drop into the right eye at bedtime.    Yes Historical Provider, MD  mirtazapine (REMERON) 15 MG tablet Take 1 tablet (15 mg total) by mouth at bedtime. 12/09/14  Yes Truitt Merle, MD  Multiple Vitamin (MULTIVITAMIN WITH MINERALS) TABS tablet Take 2 tablets by mouth daily.   Yes Historical Provider, MD  ondansetron (ZOFRAN-ODT) 4 MG disintegrating tablet DISSOLVE 1 TABLET(4 MG) ON THE TONGUE EVERY 8 HOURS AS NEEDED FOR NAUSEA OR VOMITING 10/05/14  Yes Golden Circle, FNP  oxyCODONE 30 MG 12 hr tablet Take 30 mg by mouth every 12 (twelve) hours. 12/01/14  Yes Truitt Merle, MD  Oxycodone HCl 10 MG TABS Take 1 tablet (10 mg total) by mouth every 4 (four) hours as needed. 12/01/14  Yes Truitt Merle, MD   pantoprazole (PROTONIX) 40 MG tablet Take 1 tablet (40 mg total) by mouth 2 (two) times daily before a meal. 10/20/14  Yes Golden Circle, FNP  prednisoLONE acetate (PRED FORTE) 1 % ophthalmic suspension Place 1 drop into the left eye 4 (four) times daily.    Yes Historical Provider, MD  primidone (MYSOLINE) 50 MG tablet TAKE 1 TABLET BY MOUTH EVERY NIGHT AT BEDTIME 12/02/14  Yes Rebecca S Tat, DO  prochlorperazine (COMPAZINE) 10 MG tablet Take 1 tablet (10 mg total) by mouth every 6 (six) hours as  needed. 12/09/14  Yes Truitt Merle, MD  propranolol ER (INDERAL LA) 120 MG 24 hr capsule TAKE 1 CAPSULE(120 MG) BY MOUTH DAILY 11/03/14  Yes Golden Circle, FNP   Physical Exam: Filed Vitals:   12/10/14 1845 12/10/14 1900 12/10/14 1956 12/10/14 1959  BP:  124/58  132/62  Pulse: 76 73  78  Temp:    98 F (36.7 C)  TempSrc:    Oral  Resp: 17 14  16   Height:   5\' 9"  (1.753 m)   Weight:   63.3 kg (139 lb 8.8 oz)   SpO2: 98% 97%  100%     General:  Awake and alert, rather nontoxic appearing  Eyes: Surgical pupil on the left, right pupil reactive  ENT: no drainage, thick white coating on tongue  Neck: supple  Cardiovascular: NR/RR  Respiratory: CTA bilaterally  Abdomen: Abdomen diffusely tender but it is not firm, hypoactive bowel sounds  Skin: Warm and dry  Musculoskeletal: Moves all four extremities spontaneously  Psychiatric: Affect appropriate  Neurologic: no focal deficits  Labs on Admission:  Basic Metabolic Panel:  Recent Labs Lab 12/09/14 1130 12/10/14 1715  NA 141 144  K 3.5 3.9  CL 102 109  CO2 24 23  GLUCOSE 117* 95  BUN 11 10  CREATININE 0.74 0.70  CALCIUM 9.6 8.8*   Liver Function Tests:  Recent Labs Lab 12/09/14 1130 12/10/14 1715  AST 65* 51*  ALT 19 12*  ALKPHOS 60 52  BILITOT 1.7* 1.2  PROT 7.8 6.8  ALBUMIN 3.6 3.2*    Recent Labs Lab 12/10/14 1715  LIPASE 20  CBC:  Recent Labs Lab 12/09/14 1130 12/10/14 1547  WBC 6.1 6.5   NEUTROABS  --  4.5  HGB 15.2* 14.4  HCT 45.3 44.0  MCV 95.2 96.5  PLT 271 284   U/A shows large leukocytes, ketones, blood, NITRITE POSITIVE, TNTC WBCs  Assessment/Plan Principal Problem:   UTI (urinary tract infection) Active Problems:   UTI (lower urinary tract infection)   Thrush   Nausea with vomiting   Pancreatic cancer (HCC)   1. Admit to med surg  2.  Catheter associated UTI, present on admission --Continue IV ceftazidime (proteus sensitive to this drug in prior culture) --IV fluconazole 200mg  once (she has grown yeast in the past) --Consider suprapubic catheter exchange with IR in AM --Presently, no signs of sepsis  3.  Thrush --Nystatin swish and swallow  4.  Nausea --Anti-emetics as needed.  May need to give consideration for GI referral for EGD if symptoms persist.  Patient understands that symptoms could be related to underlying disease process and current treatment.  Code Status: FULL Family Communication: Spoke to patient's daughter [name] by phone Disposition Plan: Expect her to be here at least two midnights  Time spent: 60 minutes  [employer] [facility]  12/10/2014, 10:48 PM

## 2014-12-10 NOTE — ED Notes (Signed)
Pt's Medical POA and daughter informs, that pt has a radiation tx appointment today 11/30/2014, at 3:05pm. But noted the blood in urine and came to the ER instead.

## 2014-12-10 NOTE — ED Notes (Addendum)
Pt c/o hematuria and increased nausea starting today and chronic abdominal pain.  Pain score 8/10.  Hx of pancreatic CA and suprapubic catheter.  Last radiation was yesterday.  Denies chemo.  Pt had mirtazapn added to medications, but has not taken a dose.

## 2014-12-10 NOTE — ED Provider Notes (Signed)
CSN: [id number]     Arrival date & time 12/10/14  1354 History   First MD Initiated Contact with Patient 12/10/14 1517     Chief Complaint  Patient presents with  . Hematuria  . Nausea     (Consider location/radiation/quality/duration/timing/severity/associated sxs/prior Treatment) HPI [name] is a 69 y.o. female with hx of pancraetic ca, currently going through radiation, htn, PE, clotting disorder on lovenox daily, UTIs, presents to ED with her daughter who is concerned about pt's urine being bloody. Most of the history provided by daughter. Daughter states patient has an progressively weaker, is having worsening abdominal pain, nausea, vomiting. Patient was seen yesterday by her oncologist and had blood work done as well as given a bolus of normal saline and added Compazine and mirtazapine to her medications. Nurse states patient hasn't been able to keep anything down including her medications yesterday or today. All she had this morning was 1 pain pill with one sip of water. She states anytime she tries to eat or drink something she vomits. She states that her urine looked normal yesterday, this morning she noted urine to be bright red and malodorous. Patient has a suprapubic catheter which was changed approximately 2 weeks ago. Daughter denies any fever. Patient states she is having chronic to her abdominal pain and back pain. No other complaints at this time.   Past Medical History  Diagnosis Date  . Glaucoma   . Clotting disorder ([place])   . DVT (deep venous thrombosis) (HCC)     LEFT  . GERD (gastroesophageal reflux disease)   . Hypercholesterolemia   . Essential tremor   . Diverticulosis   . Arthritis     knees and back  . Hypertension   . Colon polyps   . UTI (lower urinary tract infection)   . Pulmonary embolism ([place])     5 yrs ago. Lovenox started about 1 yr ago.  . Complication of anesthesia     slow to wake up   . Peripheral neuropathy (HCC)     feet bilaterally   . Cancer [facility])     Pancreatic- Surgery 3'16- Dr. Purcell Mouton)- no other tx.. Dr. Feng([facility]).  . Allergy     sulfa antibiotics  . Pancreas carcinoma ([place]) 11/05/14    adenocarcinoma   Past Surgical History  Procedure Laterality Date  . Cholecystectomy    . Abdominal hysterectomy    . Knee surgery Bilateral     scope  . Greenfield filter    . Back surgery    . Eus N/A 02/26/2014    Procedure: UPPER ENDOSCOPIC ULTRASOUND (EUS) LINEAR;  Surgeon: Beryle Beams, MD;  [facility];  Service: Endoscopy;  Laterality: N/A;  . Fine needle aspiration N/A 02/26/2014    Procedure: FINE NEEDLE ASPIRATION (FNA) LINEAR;  Surgeon: Beryle Beams, MD;  [facility];  Service: Endoscopy;  Laterality: N/A;  . Breast lumpectomy Right   . Spine surgery    . Xi robotic assisted laparoscopic distal pancreatectomy N/A 04/08/2014    Procedure: XI ROBOTIC ASSISTED CONVERTED LAPAROSCOPIC HAND ASSITED DISTAL PANCREATECTOMY/SPLENECTOMY WITH PARTIAL COLECTOMY;  Surgeon: [name], MD;  [facility];  Service: General;  Laterality: N/A;  . Colon surgery    . Diagnostic laparoscopy    . Cataract extraction      left eye  . Eus N/A 11/05/2014    Procedure: FULL UPPER ENDOSCOPIC ULTRASOUND (EUS) RADIAL;  Surgeon: Carol Ada, MD;  [facility];  Service: Endoscopy;  Laterality: N/A;  . Fine needle aspiration N/A 11/05/2014    Procedure: FINE NEEDLE ASPIRATION (FNA) LINEAR;  Surgeon: Carol Ada, MD;  [facility];  Service: Endoscopy;  Laterality: N/A;   Family History  Problem Relation Age of Onset  . Diabetes Father   . Cancer Father 84    prostate  . Hyperlipidemia Father   . Cataracts Maternal Grandmother   . Hypertension Mother   . Arthritis Mother   . Heart disease Mother   . Glaucoma Sister   . Cancer Sister 11    Breast  . Cancer Paternal Uncle     throat +cig  . Diabetes Paternal Grandmother    Social History  Substance Use Topics  . Smoking  status: Current Some Day Smoker -- 0.50 packs/day for 45 years    Types: Cigarettes  . Smokeless tobacco: Never Used  . Alcohol Use: No     Comment: occ- glass of brandy or wine socially  stopped June 2016   OB History    No data available     Review of Systems  Constitutional: Negative for fever and chills.  Respiratory: Negative for cough, chest tightness and shortness of breath.   Cardiovascular: Negative for chest pain, palpitations and leg swelling.  Gastrointestinal: Positive for nausea, vomiting and abdominal pain. Negative for diarrhea.  Genitourinary: Positive for hematuria. Negative for pelvic pain.  Musculoskeletal: Negative for myalgias, arthralgias, neck pain and neck stiffness.  Skin: Negative for rash.  Neurological: Negative for dizziness, weakness and headaches.  All other systems reviewed and are negative.     Allergies  Sulfa antibiotics  Home Medications   Prior to Admission medications   Medication Sig Start Date End Date Taking? Authorizing Provider  atorvastatin (LIPITOR) 40 MG tablet Take 1 tablet (40 mg total) by mouth daily. 05/05/14   Gerlene Fee, NP  atropine 1 % ophthalmic solution Place 1 drop into the left eye 2 (two) times daily.    Historical Provider, MD  cromolyn (OPTICROM) 4 % ophthalmic solution Place 1 drop into both eyes 4 (four) times daily.     Historical Provider, MD  diltiazem (CARTIA XT) 120 MG 24 hr capsule Take 1 capsule (120 mg total) by mouth daily. 05/05/14   Gerlene Fee, NP  DORZOLAMIDE HCL-TIMOLOL MAL OP Place 1 drop into the left eye 2 (two) times daily.     Historical Provider, MD  dronabinol (MARINOL) 2.5 MG capsule Take 1-2 capsules (2.5-5 mg total) by mouth 2 (two) times daily before a meal. 12/01/14   Truitt Merle, MD  enoxaparin (LOVENOX) 100 MG/ML injection Inject 1 mL (100 mg total) into the skin daily. 10/13/14   Truitt Merle, MD  fentaNYL (DURAGESIC - DOSED MCG/HR) 12 MCG/HR Place 1 patch (12.5 mcg total) onto the skin  every 3 (three) days. 12/09/14   Truitt Merle, MD  latanoprost (XALATAN) 0.005 % ophthalmic solution Place 1 drop into the right eye at bedtime.     Historical Provider, MD  mirtazapine (REMERON) 15 MG tablet Take 1 tablet (15 mg total) by mouth at bedtime. 12/09/14   Truitt Merle, MD  Multiple Vitamin (MULTIVITAMIN WITH MINERALS) TABS tablet Take 2 tablets by mouth daily.    Historical Provider, MD  ondansetron (ZOFRAN-ODT) 4 MG disintegrating tablet DISSOLVE 1 TABLET(4 MG) ON THE TONGUE EVERY 8 HOURS AS NEEDED FOR NAUSEA OR VOMITING 10/05/14   Golden Circle, FNP  oxyCODONE 30 MG 12 hr tablet Take 30 mg by mouth every 12 (  twelve) hours. 12/01/14   Truitt Merle, MD  Oxycodone HCl 10 MG TABS Take 1 tablet (10 mg total) by mouth every 4 (four) hours as needed. 12/01/14   Truitt Merle, MD  pantoprazole (PROTONIX) 40 MG tablet Take 1 tablet (40 mg total) by mouth 2 (two) times daily before a meal. 10/20/14   Golden Circle, FNP  prednisoLONE acetate (PRED FORTE) 1 % ophthalmic suspension Place 1 drop into the left eye 4 (four) times daily.     Historical Provider, MD  primidone (MYSOLINE) 50 MG tablet TAKE 1 TABLET BY MOUTH EVERY NIGHT AT BEDTIME 12/02/14   Rebecca S Tat, DO  prochlorperazine (COMPAZINE) 10 MG tablet Take 1 tablet (10 mg total) by mouth every 6 (six) hours as needed. 12/09/14   Truitt Merle, MD  propranolol ER (INDERAL LA) 120 MG 24 hr capsule TAKE 1 CAPSULE(120 MG) BY MOUTH DAILY 11/03/14   Golden Circle, FNP   BP 94/61 mmHg  Pulse 102  Temp(Src) 98.1 F (36.7 C) (Oral)  Resp 17  SpO2 96% Physical Exam  Constitutional: She is oriented to person, place, and time. She appears well-developed and well-nourished. No distress.  HENT:  Head: Normocephalic.  Eyes: Conjunctivae are normal.  Neck: Neck supple.  Cardiovascular: Normal rate, regular rhythm and normal heart sounds.   Pulmonary/Chest: Effort normal and breath sounds normal. No respiratory distress. She has no wheezes. She has no rales.   Abdominal: Soft. Bowel sounds are normal. She exhibits no distension. There is tenderness. There is no rebound.  Diffuse tenderness. Suprapubic cath in place. Draining maroon, cloudy urine  Musculoskeletal: She exhibits no edema.  Neurological: She is alert and oriented to person, place, and time.  Skin: Skin is warm and dry.  Psychiatric: She has a normal mood and affect. Her behavior is normal.  Nursing note and vitals reviewed.   ED Course  Procedures (including critical care time) Labs Review Labs Reviewed  CBC WITH DIFFERENTIAL/PLATELET - Abnormal; Notable for the following:    RDW 16.8 (*)    All other components within normal limits  URINALYSIS, ROUTINE W REFLEX MICROSCOPIC (NOT AT [facility]) - Abnormal; Notable for the following:    Color, Urine RED (*)    APPearance TURBID (*)    Hgb urine dipstick LARGE (*)    Bilirubin Urine LARGE (*)    Ketones, ur >80 (*)    Protein, ur >300 (*)    Nitrite POSITIVE (*)    Leukocytes, UA LARGE (*)    All other components within normal limits  COMPREHENSIVE METABOLIC PANEL - Abnormal; Notable for the following:    Calcium 8.8 (*)    Albumin 3.2 (*)    AST 51 (*)    ALT 12 (*)    All other components within normal limits  URINE CULTURE  LIPASE, BLOOD  URINE MICROSCOPIC-ADD ON  CBC  BASIC METABOLIC PANEL  I-STAT CG4 LACTIC ACID, ED    Imaging Review No results found. I have personally reviewed and evaluated these images and lab results as part of my medical decision-making.   EKG Interpretation None      MDM   Final diagnoses:  Ketonuria  Dehydration  UTI (lower urinary tract infection)    patient emergency department with discolored urine, new since yesterday. Also complaining of nausea, vomiting, unable to keep anything down. Will start IV fluids for hydration. Vital signs are normal. Will check labs and urinalysis.   Urine is infected. Initially ordered Rocephin, hopeful change to  Fortaz since suprapubic catheter  needs coverage for Pseudomonas. Urine culture sent. Concerning now because infection and unable to keep anything down at home. Will admit for hydration, symptom control, treatment of infection. pt nontoxic appearing otherwise. Normal labs. No elevation in lactic acid or white blood cell count.  Filed Vitals:   12/10/14 1845 12/10/14 1900 12/10/14 1956 12/10/14 1959  BP:  124/58  132/62  Pulse: 76 73  78  Temp:    98 F (36.7 C)  TempSrc:    Oral  Resp: 17 14  16   Height:   5\' 9"  (1.753 m)   Weight:   139 lb 8.8 oz (63.3 kg)   SpO2: 98% 97%  100%     Jeannett Senior, PA-C 12/11/14 0201  Varney Biles, MD 12/11/14 1520

## 2014-12-10 NOTE — ED Notes (Signed)
Called Santiago Glad at the cancer center to advise that pt is in the ED and will not be there for her scheduled radiation tx

## 2014-12-10 NOTE — Progress Notes (Signed)
ANTIBIOTIC CONSULT NOTE - INITIAL  Pharmacy Consult for Ceftazidime Indication: UTI  Allergies  Allergen Reactions  . Sulfa Antibiotics Swelling    Patient Measurements:   Adjusted Body Weight:   Vital Signs: Temp: 98.1 F (36.7 C) (11/11 1424) Temp Source: Oral (11/11 1424) BP: 117/62 mmHg (11/11 1712) Pulse Rate: 78 (11/11 1712) Intake/Output from previous day:   Intake/Output from this shift:    Labs:  Recent Labs  12/09/14 1130 12/10/14 1547  WBC 6.1 6.5  HGB 15.2* 14.4  PLT 271 284  CREATININE 0.74  --    Estimated Creatinine Clearance: 68.1 mL/min (by C-G formula based on Cr of 0.74). No results for input(s): VANCOTROUGH, VANCOPEAK, VANCORANDOM, GENTTROUGH, GENTPEAK, GENTRANDOM, TOBRATROUGH, TOBRAPEAK, TOBRARND, AMIKACINPEAK, AMIKACINTROU, AMIKACIN in the last 72 hours.   Microbiology: No results found for this or any previous visit (from the past 720 hour(s)).  Medical History: Past Medical History  Diagnosis Date  . Glaucoma   . Clotting disorder ([place])   . DVT (deep venous thrombosis) (HCC)     LEFT  . GERD (gastroesophageal reflux disease)   . Hypercholesterolemia   . Essential tremor   . Diverticulosis   . Arthritis     knees and back  . Hypertension   . Colon polyps   . UTI (lower urinary tract infection)   . Pulmonary embolism ([place])     5 yrs ago. Lovenox started about 1 yr ago.  . Complication of anesthesia     slow to wake up   . Peripheral neuropathy (HCC)     feet bilaterally  . Cancer [facility])     Pancreatic- Surgery 3'16- Dr. Purcell Mouton)- no other tx.. Dr. Feng([facility]).  . Allergy     sulfa antibiotics  . Pancreas carcinoma ([place]) 11/05/14    adenocarcinoma    Assessment: 73 yoF with PMHx UTIs, clotting disorder and recurrent PEs on chronic anticoagulation with lovenox, and hx pancreatic cancer undergoing radiation, presents with hematuria.  Pharmacy consulted to start ceftazidime for possible UTI.  Allergy noted to sulfa  antibiotics (swelling).    Anti-infectives 11/11 >> Ceftazidime  >>  Vitals/Labs WBC: 6.5K Tm24h: Afebrile Renal: SCr 0.74, CrCl 68 ml/min  Cultures 8/13 urine: >100K proteus mirabilis (R amp/unasyn, ancef, gent, macrobid, tobra, bactrim, I imipenem, S cefepime, ceftaz, CTX, cipro/levofloxacin, zosyn) 11/11 urine: collected   Goal of Therapy:  Eradication of infection  Plan:  Ceftazidime 500mg  IV q8h F/u urine culture, renal function, clinical course  Ralene Bathe, PharmD, BCPS 12/10/2014, 5:37 PM  Pager: [phone]

## 2014-12-11 ENCOUNTER — Encounter (HOSPITAL_COMMUNITY): Payer: Self-pay | Admitting: Internal Medicine

## 2014-12-11 DIAGNOSIS — E440 Moderate protein-calorie malnutrition: Secondary | ICD-10-CM

## 2014-12-11 DIAGNOSIS — I82409 Acute embolism and thrombosis of unspecified deep veins of unspecified lower extremity: Secondary | ICD-10-CM

## 2014-12-11 DIAGNOSIS — T83511A Infection and inflammatory reaction due to indwelling urethral catheter, initial encounter: Secondary | ICD-10-CM

## 2014-12-11 DIAGNOSIS — T83518A Infection and inflammatory reaction due to other urinary catheter, initial encounter: Secondary | ICD-10-CM | POA: Diagnosis not present

## 2014-12-11 DIAGNOSIS — N390 Urinary tract infection, site not specified: Secondary | ICD-10-CM | POA: Diagnosis present

## 2014-12-11 DIAGNOSIS — Z86711 Personal history of pulmonary embolism: Secondary | ICD-10-CM

## 2014-12-11 DIAGNOSIS — T83511D Infection and inflammatory reaction due to indwelling urethral catheter, subsequent encounter: Secondary | ICD-10-CM

## 2014-12-11 DIAGNOSIS — D638 Anemia in other chronic diseases classified elsewhere: Secondary | ICD-10-CM

## 2014-12-11 DIAGNOSIS — R112 Nausea with vomiting, unspecified: Secondary | ICD-10-CM

## 2014-12-11 DIAGNOSIS — N319 Neuromuscular dysfunction of bladder, unspecified: Secondary | ICD-10-CM

## 2014-12-11 DIAGNOSIS — E876 Hypokalemia: Secondary | ICD-10-CM | POA: Diagnosis present

## 2014-12-11 DIAGNOSIS — C252 Malignant neoplasm of tail of pancreas: Secondary | ICD-10-CM

## 2014-12-11 LAB — CBC
HCT: 35.9 % — ABNORMAL LOW (ref 36.0–46.0)
Hemoglobin: 11.8 g/dL — ABNORMAL LOW (ref 12.0–15.0)
MCH: 31.1 pg (ref 26.0–34.0)
MCHC: 32.9 g/dL (ref 30.0–36.0)
MCV: 94.7 fL (ref 78.0–100.0)
PLATELETS: 222 10*3/uL (ref 150–400)
RBC: 3.79 MIL/uL — AB (ref 3.87–5.11)
RDW: 16.4 % — AB (ref 11.5–15.5)
WBC: 5.7 10*3/uL (ref 4.0–10.5)

## 2014-12-11 LAB — BASIC METABOLIC PANEL
ANION GAP: 7 (ref 5–15)
BUN: 8 mg/dL (ref 6–20)
CALCIUM: 8.5 mg/dL — AB (ref 8.9–10.3)
CO2: 24 mmol/L (ref 22–32)
Chloride: 112 mmol/L — ABNORMAL HIGH (ref 101–111)
Creatinine, Ser: 0.62 mg/dL (ref 0.44–1.00)
GFR calc non Af Amer: >60 mL/min (ref >60)
Glucose, Bld: 94 mg/dL (ref 65–99)
POTASSIUM: 2.8 mmol/L — AB (ref 3.5–5.1)
Sodium: 143 mmol/L (ref 135–145)

## 2014-12-11 MED ORDER — FENTANYL 12 MCG/HR TD PT72
12.5000 ug | MEDICATED_PATCH | TRANSDERMAL | Status: DC
  Administered 2014-12-11: 12.5 ug via TRANSDERMAL
  Filled 2014-12-11: qty 1

## 2014-12-11 MED ORDER — POTASSIUM CHLORIDE 10 MEQ/100ML IV SOLN
10.0000 meq | INTRAVENOUS | Status: AC
  Administered 2014-12-11 (×5): 10 meq via INTRAVENOUS
  Filled 2014-12-11: qty 100

## 2014-12-11 NOTE — Progress Notes (Addendum)
Patient ID: [name], female   DOB: 1945-12-18, 69 y.o.   MRN: [id number] [facility] PROGRESS NOTE  [name] [id number] DOB: 12/18/1945 DOA: 12/10/2014 PCP: Mauricio Po, FNP  Brief narrative:    69 y.o. female with a past medical history of recurrent pancreatic cancer S/P distal pancreatectomy, splenectomy, and partial colectomy in March, currently undergoing radiation therapy. She presented to [facility] with at least one week of increased nausea and vomiting with decreased PO intake.In addition, she noted blood in catheter. No fevers or chills.   On admission, she was hemodynamically stable. Her UA showed large leukocytes with itrites and TNTC WBC. She was started on [place] and admitted for further evaluation.  Anticipated discharge: Likely by 11/15 if urine culture results are back, if UO good and if pain controlled.   Assessment/Plan:    Principal Problem:  Nausea with vomiting UTI (urinary tract infection) due to urinary indwelling catheter (HCC) / Hematuria - UA showed large leukocytes, positive nitrites and TNTC WBC - Urine culture re-incubated for better growth  - Continue fortaz for now - Continue supportive care with antiemetics  - Hgb stable, will keep an eye on blood in catheter, so far clear. If any bleed we may need to stop Lovenox.  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 Burr Medico - On supportive care, palliative radiation 12/06/14-12/17/14     Malnutrition of moderate degree ([name]) - In the context of chronic illness - Liberalize the diet as tolerated     Anemia of chronic disease - Due to pancreatic cancer  - Hemoglobin stable     Neurogenic bladder - Has suprapubic catheter  - Will see if needs to be exchanged, likely by Monday     Hypokalemia - Due to GI  losses, N/V - Supplemented    DVT Prophylaxis  - Lovenox subQ therapeutic anticoagulation.   Code Status: Full.  Family Communication:  plan of care discussed with the patient Disposition Plan: Home likely by 11/15 if pain controlled, if UO good, if urine culture results are back    IV access:  Peripheral IV  Procedures and diagnostic studies:    No results found.  Medical Consultants:  IR  Other Consultants:  None  IAnti-Infectives:   Tressie Ellis 12/10/2014 -->    Leisa Lenz, MD  [facility] Pager [phone]  Time spent in minutes: 25 minutes  If 7PM-7AM, please contact night-coverage [email or web address] Password [email or web address] 12/11/2014, 11:10 AM   LOS: 1 day    HPI/Subjective: No acute overnight events. Patient reports abdominal pain.  Objective: Filed Vitals:   12/10/14 1900 12/10/14 1956 12/10/14 1959 12/11/14 0616  BP: 124/58  132/62 115/64  Pulse: 73  78 76  Temp:   98 F (36.7 C) 98.2 F (36.8 C)  TempSrc:   Oral Oral  Resp: 14  16 16   Height:  5\' 9"  (1.753 m)    Weight:  63.3 kg (139 lb 8.8 oz)    SpO2: 97%  100% 99%    Intake/Output Summary (Last 24 hours) at 12/11/14 1110 Last data filed at 12/11/14 0900  Gross per 24 hour  Intake 669.17 ml  Output      0 ml  Net 669.17 ml    Exam:   General:  Pt is alert, follows commands appropriately, not in acute distress  Cardiovascular:  Regular rate and rhythm, S1/S2 (+)  Respiratory: Clear to auscultation bilaterally, no wheezing, no crackles, no rhonchi  Abdomen: pain in suprapubic area, suprapubic catheter, (+) BS  Extremities: No edema, pulses DP and PT palpable bilaterally  Neuro: Grossly nonfocal  Data Reviewed: Basic Metabolic Panel:  Recent Labs Lab 12/09/14 1130 12/10/14 1715 12/11/14 0538  NA 141 144 143  K 3.5 3.9 2.8*  CL 102 109 112*  CO2 24 23 24   GLUCOSE 117* 95 94  BUN 11 10 8   CREATININE 0.74 0.70 0.62  CALCIUM 9.6 8.8* 8.5*   Liver Function Tests:  Recent  Labs Lab 12/09/14 1130 12/10/14 1715  AST 65* 51*  ALT 19 12*  ALKPHOS 60 52  BILITOT 1.7* 1.2  PROT 7.8 6.8  ALBUMIN 3.6 3.2*    Recent Labs Lab 12/10/14 1715  LIPASE 20   No results for input(s): AMMONIA in the last 168 hours. CBC:  Recent Labs Lab 12/09/14 1130 12/10/14 1547 12/11/14 0538  WBC 6.1 6.5 5.7  NEUTROABS  --  4.5  --   HGB 15.2* 14.4 11.8*  HCT 45.3 44.0 35.9*  MCV 95.2 96.5 94.7  PLT 271 284 222   Cardiac Enzymes: No results for input(s): CKTOTAL, CKMB, CKMBINDEX, TROPONINI in the last 168 hours. BNP: Invalid input(s): POCBNP CBG: No results for input(s): GLUCAP in the last 168 hours.  Recent Results (from the past 240 hour(s))  Urine culture     Status: None (Preliminary result)   Collection Time: 12/10/14  3:58 PM  Result Value Ref Range Status   Specimen Description URINE, RANDOM  Final   Special Requests NONE  Final   Culture   Final    CULTURE REINCUBATED FOR BETTER GROWTH Performed at [facility]    Report Status PENDING  Incomplete     Scheduled Meds: . atorvastatin  40 mg Oral q1800  . atropine  1 drop Left Eye BID  . cefTAZidime (FORTAZ)  IV  500 mg Intravenous 3 times per day  . cromolyn  1 drop Both Eyes QID  . diltiazem  120 mg Oral Daily  . dorzolamide-timolol  1 drop Left Eye BID  . dronabinol  2.5-5 mg Oral BID AC  . enoxaparin  100 mg Subcutaneous QHS  . [START ON 12/12/2014] fentaNYL  12.5 mcg Transdermal Q72H  . latanoprost  1 drop Right Eye QHS  . mirtazapine  15 mg Oral QHS  . multivitamin with minerals  2 tablet Oral Daily  . nystatin  5 mL Oral QID  . oxyCODONE  30 mg Oral Q12H  . pantoprazole  40 mg Oral BID AC  . potassium chloride  10 mEq Intravenous Q1 Hr x 5  . prednisoLONE acetate  1 drop Left Eye QID  . primidone  50 mg Oral QHS  . propranolol ER  120 mg Oral Daily   Continuous Infusions: . sodium chloride 75 mL/hr at 12/10/14 2300

## 2014-12-12 DIAGNOSIS — I10 Essential (primary) hypertension: Secondary | ICD-10-CM

## 2014-12-12 DIAGNOSIS — E785 Hyperlipidemia, unspecified: Secondary | ICD-10-CM

## 2014-12-12 LAB — BASIC METABOLIC PANEL
ANION GAP: 8 (ref 5–15)
BUN: 6 mg/dL (ref 6–20)
CO2: 25 mmol/L (ref 22–32)
CREATININE: 0.53 mg/dL (ref 0.44–1.00)
Calcium: 8.7 mg/dL — ABNORMAL LOW (ref 8.9–10.3)
Chloride: 108 mmol/L (ref 101–111)
GFR calc non Af Amer: >60 mL/min (ref >60)
Glucose, Bld: 119 mg/dL — ABNORMAL HIGH (ref 65–99)
Potassium: 3.3 mmol/L — ABNORMAL LOW (ref 3.5–5.1)
SODIUM: 141 mmol/L (ref 135–145)

## 2014-12-12 LAB — URINE CULTURE

## 2014-12-12 MED ORDER — POTASSIUM CHLORIDE CRYS ER 20 MEQ PO TBCR
40.0000 meq | EXTENDED_RELEASE_TABLET | Freq: Once | ORAL | Status: AC
  Administered 2014-12-12: 40 meq via ORAL
  Filled 2014-12-12: qty 2

## 2014-12-12 MED ORDER — ENSURE ENLIVE PO LIQD
237.0000 mL | Freq: Two times a day (BID) | ORAL | Status: DC
  Administered 2014-12-13 – 2014-12-14 (×3): 237 mL via ORAL

## 2014-12-12 NOTE — Progress Notes (Addendum)
Patient ID: [name], female   DOB: 1945/04/14, 69 y.o.   MRN: [id number] [facility] PROGRESS NOTE  [name] [id number] DOB: 1945-06-22 DOA: 12/10/2014 PCP: Mauricio Po, FNP  Brief narrative:    69 y.o. female with a past medical history of recurrent pancreatic cancer S/P distal pancreatectomy, splenectomy, and partial colectomy in March, currently undergoing radiation therapy. She presented to [facility] with at least one week of increased nausea and vomiting with decreased PO intake.In addition, she noted blood in catheter. No fevers or chills.   On admission, she was hemodynamically stable. Her UA showed large leukocytes with itrites and TNTC WBC. She was started on [place] and admitted for further evaluation.  Anticipated discharge: Likely by 11/15 if urine culture results are back. Also, she will need suprapubic cath exchanged tomorrow 11/14.   Assessment/Plan:    Principal Problem:  Nausea with vomiting UTI (urinary tract infection) due to urinary indwelling catheter (HCC) / Hematuria - UA showed large leukocytes, positive nitrites and TNTC WBC. Urine cultures pending as of today 12/12/2014. - Continue Fortaz until final urine culture results are back. - No further episodes of hematuria.  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 Burr Medico - On supportive care, palliative radiation 12/06/14-12/17/14     Dyslipidemia - Continue Lipitor 40 mg at bedtime    Essential hypertension - Continue Cardizem 120 mg daily and propranolol 120 mg daily    Malnutrition of moderate degree (HCC) - In the context of chronic illness - Continue Marinol - Tolerates regular diet    Anemia of chronic disease - Due to pancreatic cancer   - Hemoglobin stable, 11.8    Neurogenic bladder - Has suprapubic catheter  - Plan to exchange it per IR tomorrow.    Hypokalemia - Secondary to GI losses. It was supplemented. - BMP pending this morning.    DVT Prophylaxis  - Lovenox subQ therapeutic anticoagulation.   Code Status: Full.  Family Communication:  plan of care discussed with the patient Disposition Plan: Home likely by 11/15 if pain controlled, if UO good, if urine culture results are back    IV access:  Peripheral IV  Procedures and diagnostic studies:    No results found.  Medical Consultants:  IR  Other Consultants:  None  IAnti-Infectives:   Tressie Ellis 12/10/2014 -->    Leisa Lenz, MD  [facility] Pager [phone]  Time spent in minutes: 25 minutes  If 7PM-7AM, please contact night-coverage [email or web address] Password [facility] 12/12/2014, 7:58 AM   LOS: 2 days    HPI/Subjective: No acute overnight events. Patient reports abdominal pain.  Objective: Filed Vitals:   12/11/14 0616 12/11/14 1435 12/11/14 2112 12/12/14 0534  BP: 115/64 96/57 106/62 116/53  Pulse: 76 73 76 73  Temp: 98.2 F (36.8 C) 98 F (36.7 C) 98.4 F (36.9 C) 98.2 F (36.8 C)  TempSrc: Oral Oral Oral Oral  Resp: 16 16 16 16   Height:      Weight:      SpO2: 99% 97% 94% 98%    Intake/Output Summary (Last 24 hours) at 12/12/14 0758 Last data filed at 12/12/14 0700  Gross per 24 hour  Intake   3360 ml  Output   1260 ml  Net   2100  ml    Exam:   General:  Pt is alert, not in acute distress  Cardiovascular: RRR, (+) S1, S2   Respiratory: No wheezing, no crackles, no rhonchi  Abdomen: tenderness appreciated in suprapubic area without the guarding, (+) BS  Extremities: No leg swelling, palpable pulses   Neuro: No focal deficits   Data Reviewed: Basic Metabolic Panel:  Recent Labs Lab 12/09/14 1130 12/10/14 1715 12/11/14 0538  NA 141 144 143  K 3.5 3.9 2.8*  CL 102 109 112*  CO2 24 23 24    GLUCOSE 117* 95 94  BUN 11 10 8   CREATININE 0.74 0.70 0.62  CALCIUM 9.6 8.8* 8.5*   Liver Function Tests:  Recent Labs Lab 12/09/14 1130 12/10/14 1715  AST 65* 51*  ALT 19 12*  ALKPHOS 60 52  BILITOT 1.7* 1.2  PROT 7.8 6.8  ALBUMIN 3.6 3.2*    Recent Labs Lab 12/10/14 1715  LIPASE 20   No results for input(s): AMMONIA in the last 168 hours. CBC:  Recent Labs Lab 12/09/14 1130 12/10/14 1547 12/11/14 0538  WBC 6.1 6.5 5.7  NEUTROABS  --  4.5  --   HGB 15.2* 14.4 11.8*  HCT 45.3 44.0 35.9*  MCV 95.2 96.5 94.7  PLT 271 284 222   Cardiac Enzymes: No results for input(s): CKTOTAL, CKMB, CKMBINDEX, TROPONINI in the last 168 hours. BNP: Invalid input(s): POCBNP CBG: No results for input(s): GLUCAP in the last 168 hours.  Recent Results (from the past 240 hour(s))  Urine culture     Status: None (Preliminary result)   Collection Time: 12/10/14  3:58 PM  Result Value Ref Range Status   Specimen Description URINE, RANDOM  Final   Special Requests NONE  Final   Culture   Final    CULTURE REINCUBATED FOR BETTER GROWTH Performed at [facility]    Report Status PENDING  Incomplete     Scheduled Meds: . atorvastatin  40 mg Oral q1800  . atropine  1 drop Left Eye BID  . cefTAZidime (FORTAZ)  IV  500 mg Intravenous 3 times per day  . cromolyn  1 drop Both Eyes QID  . diltiazem  120 mg Oral Daily  . dorzolamide-timolol  1 drop Left Eye BID  . dronabinol  2.5-5 mg Oral BID AC  . enoxaparin  100 mg Subcutaneous QHS  . fentaNYL  12.5 mcg Transdermal Q72H  . latanoprost  1 drop Right Eye QHS  . mirtazapine  15 mg Oral QHS  . multivitamin with minerals  2 tablet Oral Daily  . nystatin  5 mL Oral QID  . oxyCODONE  30 mg Oral Q12H  . pantoprazole  40 mg Oral BID AC  . prednisoLONE acetate  1 drop Left Eye QID  . primidone  50 mg Oral QHS  . propranolol ER  120 mg Oral Daily   Continuous Infusions: . sodium chloride 75 mL/hr at 12/12/14 0202

## 2014-12-12 NOTE — Progress Notes (Signed)
Bladder scan performed. > 600cc found in bladder. I&O cath done. 1000 cc out.

## 2014-12-12 NOTE — Progress Notes (Signed)
Initial Nutrition Assessment  DOCUMENTATION CODES:   Non-severe (moderate) malnutrition in context of chronic illness  INTERVENTION:  Ensure Enlive po BID, each supplement provides 350 kcal and 20 grams of protein  NUTRITION DIAGNOSIS:   Malnutrition related to cancer and cancer related treatments, chronic illness as evidenced by moderate depletion of body fat, percent weight loss, mild depletion of muscle mass.  GOAL:   Patient will meet greater than or equal to 90% of their needs  MONITOR:   PO intake, I & O's, Labs, Skin  REASON FOR ASSESSMENT:   Malnutrition Screening Tool    ASSESSMENT:   [name] is a 69 y.o. woman with a history of recurrent pancreatic cancer S/P distal pancreatectomy, splenectomy, and partial colectomy in March. She is currently under going radiation therapy. She has had at least one week of increased nausea and vomiting with decreased PO intake. She has discussed this with her oncologist. Within the past 24 hours, she has had new hematuria with increased urine odor.  Spoke with pt at bedside. Pt admits to poor appetite related to cancer, taste changes, XRT therapy, and nausea.  Pt exhibits 26#/15.7% severe wt loss in 2 months.  Nutrition-Focused physical exam completed. Findings are mod fat depletion, mod muscle depletion, and no edema.   Pt meets criteria for moderate malnutrition related to chronic illness.  Will provide pt with Ensure Enlive during stay. Labs & Medications reviewed. K 3.3   Diet Order:  Diet regular Room service appropriate?: Yes; Fluid consistency:: Thin  Skin:  Reviewed, no issues  Last BM:  PTA  Height:   Ht Readings from Last 1 Encounters:  12/10/14 5\' 9"  (1.753 m)    Weight:   Wt Readings from Last 1 Encounters:  12/10/14 139 lb 8.8 oz (63.3 kg)    Ideal Body Weight:  65.9 kg  BMI:  Body mass index is 20.6 kg/(m^2).  Estimated Nutritional Needs:   Kcal:  1600-1900 calories  Protein:   63-75 grams  Fluid:  >/= 1.6L  EDUCATION NEEDS:   No education needs identified at this time  [name]. Roger Fasnacht, MS, RD LDN After Hours/Weekend Pager [phone]

## 2014-12-13 ENCOUNTER — Other Ambulatory Visit: Payer: Self-pay | Admitting: *Deleted

## 2014-12-13 ENCOUNTER — Telehealth: Payer: Self-pay | Admitting: *Deleted

## 2014-12-13 ENCOUNTER — Ambulatory Visit
Admission: RE | Admit: 2014-12-13 | Discharge: 2014-12-13 | Disposition: A | Discharge status: Home / Self Care | Payer: Medicare Other | Source: Ambulatory Visit | Attending: Radiation Oncology | Admitting: Radiation Oncology

## 2014-12-13 ENCOUNTER — Ambulatory Visit: Payer: Medicare Other

## 2014-12-13 DIAGNOSIS — C252 Malignant neoplasm of tail of pancreas: Secondary | ICD-10-CM

## 2014-12-13 NOTE — Care Management Note (Signed)
Case Management Note  Patient Details  Name: [name] MRN: [id number] Date of Birth: 05/06/1945  Subjective/Objective:    69 y/o f admitted w/UTI. rom home.                Action/Plan:d/c plan home.   Expected Discharge Date:                  Expected Discharge Plan:  Home/Self Care  In-House Referral:  Clinical Social Work  Discharge planning Services  CM Consult  Post Acute Care Choice:    Choice offered to:     DME Arranged:    DME Agency:     HH Arranged:    HH Agency:     Status of Service:  In process, will continue to follow  Medicare Important Message Given:  Yes Date Medicare IM Given:    Medicare IM give by:    Date Additional Medicare IM Given:    Additional Medicare Important Message give by:     If discussed at [place] of Stay Meetings, dates discussed:    Additional Comments:  Dessa Phi, RN 12/13/2014, 8:35 PM

## 2014-12-13 NOTE — Telephone Encounter (Signed)
Referral made for [facility] for IVF weekly.  Called [facility] to notify & spoke with Carolynn Sayers RN & then discovered pt was admitted to hospital on fri.  Discussed with Dr Burr Medico & will hold off on IVF for this week since she will be getting IVF in the hosp.  Will readdress this later.  Pam will wait to hear from [place].

## 2014-12-13 NOTE — Progress Notes (Signed)
ANTIBIOTIC CONSULT NOTE - follow up Pharmacy Consult for Ceftazidime Indication: UTI  Allergies  Allergen Reactions  . Sulfa Antibiotics Swelling    Patient Measurements: Height: 5\' 9"  (175.3 cm) Weight: 139 lb 8.8 oz (63.3 kg) IBW/kg (Calculated) : 66.2 Adjusted Body Weight:   Vital Signs: Temp: 98 F (36.7 C) (11/14 0623) Temp Source: Oral (11/14 0623) BP: 111/63 mmHg (11/14 0623) Pulse Rate: 67 (11/14 0623) Intake/Output from previous day: 11/13 0701 - 11/14 0700 In: 834.6 [P.O.:360; I.V.:424.6; IV Piggyback:50] Out: 1150 [Urine:1150] Intake/Output from this shift: Total I/O In: 240 [P.O.:240] Out: -   Labs:  Recent Labs  12/10/14 1547 12/10/14 1715 12/11/14 0538 12/12/14 0817  WBC 6.5  --  5.7  --   HGB 14.4  --  11.8*  --   PLT 284  --  222  --   CREATININE  --  0.70 0.62 0.53   Estimated Creatinine Clearance: 68.2 mL/min (by C-G formula based on Cr of 0.53). No results for input(s): VANCOTROUGH, VANCOPEAK, VANCORANDOM, GENTTROUGH, GENTPEAK, GENTRANDOM, TOBRATROUGH, TOBRAPEAK, TOBRARND, AMIKACINPEAK, AMIKACINTROU, AMIKACIN in the last 72 hours.   Microbiology: Recent Results (from the past 720 hour(s))  Urine culture     Status: None   Collection Time: 12/10/14  3:58 PM  Result Value Ref Range Status   Specimen Description URINE, RANDOM  Final   Special Requests NONE  Final   Culture   Final    MULTIPLE SPECIES PRESENT, SUGGEST RECOLLECTION Performed at [facility]    Report Status 12/12/2014 FINAL  Final    Medical History: History reviewed. No pertinent past medical history.  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  Pharmacy consulted to start ceftazidime for possible UTI.  Allergy noted to sulfa antibiotics (swelling).    Anti-infectives 11/11 >> Ceftazidime  >>  Vitals/Labs WBC:  WNL Tm24h: Afebrile Renal: SCr 0.53, CrCl 68 ml/min  Cultures 8/13 urine: >100K proteus mirabilis (R amp/unasyn, ancef, gent, macrobid, tobra, bactrim, I imipenem, S cefepime, ceftaz, CTX, cipro/levofloxacin, zosyn) 11/11 urine: multiple species present  Goal of Therapy:  Eradication of infection  Plan:  Continue Ceftazidime 500mg  IV q8h F/u urine culture, renal function, clinical course  Dolly Rias RPh 12/13/2014, 10:02 AM Pager [phone]

## 2014-12-13 NOTE — Progress Notes (Signed)
[facility] Therapy Treatment Record Phone [phone]   Radiation Therapy was administered to [name] on: 12/13/2014  4:26 PM and was treatment # 5 out of a planned course of 10 treatments.

## 2014-12-13 NOTE — Telephone Encounter (Signed)
Called [address] floor, spoke with Winfred Burn, paatient is able to come for her radiation", thanked RN, will medicate for pain if needed,called [facility] and let therapist know patient can be treated today 9:12 AM

## 2014-12-13 NOTE — Care Management Important Message (Signed)
Important Message  Patient Details  Name: HJORDIS HOWLEY MRN: [id number] Date of Birth: 23-Jun-1945   Medicare Important Message Given:  Yes    Camillo Flaming 12/13/2014, 11:37 AMImportant Message  Patient Details  Name: MARLAYNE RUTTEN MRN: [id number] Date of Birth: 15-Jan-1946   Medicare Important Message Given:  Yes    Camillo Flaming 12/13/2014, 11:37 AM

## 2014-12-13 NOTE — Progress Notes (Addendum)
Patient ID: [name], female   DOB: [birth year], 69 y.o.   MRN: [id number] [facility] PROGRESS NOTE  NASYA KOSS [id number] DOB: May 11, 1945 DOA: 12/10/2014 PCP: Mauricio Po, FNP  Brief narrative:    69 y.o. female with a past medical history of recurrent pancreatic cancer S/P distal pancreatectomy, splenectomy, and partial colectomy in March, currently undergoing radiation therapy. She presented to [facility] with at least one week of increased nausea and vomiting with decreased PO intake.In addition, she noted blood in catheter. No fevers or chills.   On admission, she was hemodynamically stable. Her UA showed large leukocytes with itrites and TNTC WBC. She was started on [place] and admitted for further evaluation.  Anticipated discharge: Likely by 11/15   Assessment/Plan:    Principal Problem:  Nausea with vomiting UTI (urinary tract infection) due to urinary indwelling catheter (HCC) / Hematuria - UA showed large leukocytes, positive nitrites and TNTC WBC.  - Urine cultures grew multiple species, none predominant - Continue Fortaz for at least next 24 hours - No further episodes of hematuria.  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 Burr Medico - On palliative radiation 12/06/14-12/17/14  - On 11/14, treatment 5 out of 10 planned    Dyslipidemia - Continue Lipitor 40 mg at bedtime    Essential hypertension - Continue Cardizem 120 mg daily and propranolol 120 mg daily    Malnutrition of moderate degree ([place]) - In the context of chronic illness - Continue Marinol - Seen by dietician     Anemia of chronic disease - Secondary to history of pancreatic cancer  - No current indications for transfusion     Neurogenic bladder - Has suprapubic catheter  -  Plan to exchange or reposition it depending on IR recommendations     Hypokalemia - Secondary to GI losses. It was supplemented. - Check BMP tomorrow am   DVT Prophylaxis  - Lovenox subQ   Code Status: Full.  Family Communication:  plan of care discussed with the patient Disposition Plan: Home likely by 11/15   IV access:  Peripheral IV  Procedures and diagnostic studies:    No results found.  Medical Consultants:  IR  Other Consultants:  None  IAnti-Infectives:   Tressie Ellis 12/10/2014 -->    Leisa Lenz, MD  [facility] Pager [phone]  Time spent in minutes: 15 minutes  If 7PM-7AM, please contact night-coverage [email or web address] Password [email or web address] 12/13/2014, 12:42 PM   LOS: 3 days    HPI/Subjective: No acute overnight events. Patient reports she feels better.   Objective: Filed Vitals:   12/12/14 0534 12/12/14 1500 12/12/14 2231 12/13/14 0623  BP: 116/53 120/61 128/65 111/63  Pulse: 73 68 76 67  Temp: 98.2 F (36.8 C) 97.9 F (36.6 C) 97.9 F (36.6 C) 98 F (36.7 C)  TempSrc: Oral Oral Oral Oral  Resp: 16 16 18 18   Height:      Weight:      SpO2: 98% 100% 100% 100%    Intake/Output Summary (Last 24 hours) at 12/13/14 1242 Last data filed at 12/13/14 1000  Gross per 24 hour  Intake 954.58 ml  Output   1150 ml  Net -195.42 ml    Exam:   General:  Pt is alert, awake, no acute distress  Cardiovascular: Rate controlled, appreciate  S1, S2   Respiratory: Bilateral air entry, no wheezing   Abdomen: appreciate bowel sounds, no rebound or guarding   Extremities: No edema, palpable pulses   Neuro: Nn focal  Data Reviewed: Basic Metabolic Panel:  Recent Labs Lab 12/09/14 1130 12/10/14 1715 12/11/14 0538 12/12/14 0817  NA 141 144 143 141  K 3.5 3.9 2.8* 3.3*  CL 102 109 112* 108  CO2 24 23 24 25   GLUCOSE 117* 95 94 119*  BUN 11 10 8 6   CREATININE 0.74 0.70 0.62 0.53  CALCIUM 9.6 8.8* 8.5* 8.7*   Liver Function Tests:  Recent  Labs Lab 12/09/14 1130 12/10/14 1715  AST 65* 51*  ALT 19 12*  ALKPHOS 60 52  BILITOT 1.7* 1.2  PROT 7.8 6.8  ALBUMIN 3.6 3.2*    Recent Labs Lab 12/10/14 1715  LIPASE 20   No results for input(s): AMMONIA in the last 168 hours. CBC:  Recent Labs Lab 12/09/14 1130 12/10/14 1547 12/11/14 0538  WBC 6.1 6.5 5.7  NEUTROABS  --  4.5  --   HGB 15.2* 14.4 11.8*  HCT 45.3 44.0 35.9*  MCV 95.2 96.5 94.7  PLT 271 284 222   Cardiac Enzymes: No results for input(s): CKTOTAL, CKMB, CKMBINDEX, TROPONINI in the last 168 hours. BNP: Invalid input(s): POCBNP CBG: No results for input(s): GLUCAP in the last 168 hours.  Recent Results (from the past 240 hour(s))  Urine culture     Status: None   Collection Time: 12/10/14  3:58 PM  Result Value Ref Range Status   Specimen Description URINE, RANDOM  Final   Special Requests NONE  Final   Culture   Final    MULTIPLE SPECIES PRESENT, SUGGEST RECOLLECTION Performed at [facility]    Report Status 12/12/2014 FINAL  Final     Scheduled Meds: . atorvastatin  40 mg Oral q1800  . atropine  1 drop Left Eye BID  . cefTAZidime (FORTAZ)  IV  500 mg Intravenous 3 times per day  . cromolyn  1 drop Both Eyes QID  . diltiazem  120 mg Oral Daily  . dorzolamide-timolol  1 drop Left Eye BID  . dronabinol  2.5-5 mg Oral BID AC  . enoxaparin  100 mg Subcutaneous QHS  . feeding supplement (ENSURE ENLIVE)  237 mL Oral BID BM  . fentaNYL  12.5 mcg Transdermal Q72H  . latanoprost  1 drop Right Eye QHS  . mirtazapine  15 mg Oral QHS  . multivitamin with minerals  2 tablet Oral Daily  . nystatin  5 mL Oral QID  . oxyCODONE  30 mg Oral Q12H  . pantoprazole  40 mg Oral BID AC  . prednisoLONE acetate  1 drop Left Eye QID  . primidone  50 mg Oral QHS  . propranolol ER  120 mg Oral Daily   Continuous Infusions: . sodium chloride 50 mL/hr at 12/12/14 1836

## 2014-12-14 ENCOUNTER — Ambulatory Visit: Payer: Medicare Other

## 2014-12-14 ENCOUNTER — Ambulatory Visit
Admission: RE | Admit: 2014-12-14 | Discharge: 2014-12-14 | Disposition: A | Discharge status: Home / Self Care | Payer: Medicare Other | Source: Ambulatory Visit | Attending: Radiation Oncology | Admitting: Radiation Oncology

## 2014-12-14 MED ORDER — ENSURE ENLIVE PO LIQD: 237.0000 mL | Freq: Two times a day (BID) | ORAL | Status: DC

## 2014-12-14 MED ORDER — CIPROFLOXACIN HCL 500 MG PO TABS: 500.0000 mg | ORAL_TABLET | Freq: Two times a day (BID) | ORAL | Status: DC

## 2014-12-14 MED ORDER — ACETAMINOPHEN 325 MG PO TABS: 650.0000 mg | ORAL_TABLET | Freq: Four times a day (QID) | ORAL | Status: DC | PRN

## 2014-12-14 MED ORDER — POTASSIUM CHLORIDE CRYS ER 20 MEQ PO TBCR
20.0000 meq | EXTENDED_RELEASE_TABLET | Freq: Once | ORAL | Status: AC
  Administered 2014-12-14: 20 meq via ORAL
  Filled 2014-12-14: qty 1

## 2014-12-14 NOTE — Discharge Instructions (Signed)

## 2014-12-14 NOTE — Care Management Note (Signed)
Case Management Note  Patient Details  Name: [name] MRN: [id number] Date of Birth: 1945/10/26  Subjective/Objective:                    Action/Plan:d/c home no needs or orders.   Expected Discharge Date:                  Expected Discharge Plan:  Home/Self Care  In-House Referral:  Clinical Social Work  Discharge planning Services  CM Consult  Post Acute Care Choice:    Choice offered to:     DME Arranged:    DME Agency:     HH Arranged:    [place] Agency:     Status of Service:  Completed, signed off  Medicare Important Message Given:  Yes Date Medicare IM Given:    Medicare IM give by:    Date Additional Medicare IM Given:    Additional Medicare Important Message give by:     If discussed at [place] of Stay Meetings, dates discussed:    Additional Comments:  Dessa Phi, RN 12/14/2014, 10:48 AM

## 2014-12-14 NOTE — Progress Notes (Signed)
[facility] Therapy Treatment Record Phone [phone]   Radiation Therapy was administered to [name] on: 12/14/2014  11:28 AM and was treatment # 6 out of a planned course of 10 treatments.

## 2014-12-14 NOTE — Discharge Summary (Signed)
Physician Discharge Summary  [name] [id number] DOB: [birth year] DOA: 12/10/2014  PCP: Mauricio Po, FNP   Admit date: 12/10/2014 Discharge date: 12/14/2014  Recommendations for Outpatient Follow-up:  1. Continue ciprofloxacin for 5 days and discharge 2. Follow-up with urology per scheduled appointment  to check suprapubic catheter 3. 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 female with a past medical history of recurrent pancreatic cancer S/P distal pancreatectomy, splenectomy, and partial colectomy in March, currently undergoing radiation therapy. She presented to [facility] with at least one week of increased nausea and vomiting with decreased PO intake.In addition, she noted blood in catheter. No fevers or chills.   On admission, she was hemodynamically stable. Her UA showed large leukocytes with itrites and TNTC WBC. She was started on [place] and admitted for further evaluation.  Hospital Course:   Assessment/Plan:    Principal Problem:  Nausea with vomiting UTI (urinary tract infection) due to urinary indwelling catheter (HCC) / Hematuria - UA showed large leukocytes, positive nitrites and TNTC WBC.  - Urine cultures grew multiple species, none predominant - patient was started on [place] on the admission. Will change to ciprofloxacin for 5 days on  discharge. - No hematuria since the admission.   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 Burr Medico - On palliative radiation 12/06/14-12/17/14  - On 11/14, treatment 5 out of 10 planned. She will get RT #5 today prior to dicahrge    Dyslipidemia - Continue Lipitor 40 mg at bedtime    Essential hypertension - Continue Cardizem 120 mg daily and propranolol 120 mg daily    Malnutrition of moderate degree (HCC) - In the context of chronic illness - Continue Marinol     Anemia of chronic disease - Secondary to history of pancreatic cancer  - Hgb stable     Neurogenic bladder - Has suprapubic catheter  - Now draining good - No need to replace it since she just had it replaced earlier in november 2016    Hypokalemia - Secondary to GI losses.  - Supplemented   DVT Prophylaxis  - Lovenox subQ for h/o PE  Code Status: Full.  Family Communication:  plan of care discussed with the patient    IV access:  Peripheral IV  Procedures and diagnostic studies:    No results found.  Medical Consultants:  IR  Other Consultants:  None  IAnti-Infectives:   Tressie Ellis 12/10/2014 -->   Signed:  Leisa Lenz, MD  [facility] 12/14/2014, 9:37 AM  Pager #: [phone]  Time spent in minutes: less than 30 minutes    Discharge Exam: Filed Vitals:   12/14/14 0611  BP: 103/56  Pulse: 78  Temp: 98.8 F (  37.1 C)  Resp: 16   Filed Vitals:   12/13/14 1421 12/13/14 1647 12/13/14 2110 12/14/14 0611  BP: 89/48 90/51 98/52  103/56  Pulse: 79  75 78  Temp:   98.8 F (37.1 C) 98.8 F (37.1 C)  TempSrc:   Oral Oral  Resp:   16 16  Height:      Weight:      SpO2:   100% 97%    General: Pt is alert, follows commands appropriately, not in acute  distress Cardiovascular: Regular rate and rhythm, S1/S2 +, no murmurs Respiratory: Clear to auscultation bilaterally, no wheezing, no crackles, no rhonchi Abdominal: Soft, non tender, non distended, bowel sounds +, suprapubic cath in place  Extremities: no edema, no cyanosis, pulses palpable bilaterally DP and PT Neuro: Grossly nonfocal  Discharge Instructions  Discharge Instructions    Call MD for:  difficulty breathing, headache or visual disturbances    Complete by:  As directed      Call MD for:  persistant dizziness or light-headedness    Complete by:  As directed      Call MD for:  persistant nausea and vomiting    Complete by:  As directed      Call MD for:  severe uncontrolled pain    Complete by:  As directed      Diet - low sodium heart healthy    Complete by:  As directed      Discharge instructions    Complete by:  As directed   1. Continue ciprofloxacin for 5 days and discharge 2. Follow-up with urology per scheduled appointment  to check suprapubic catheter 3. Continue radiation treatment as planned     Increase activity slowly    Complete by:  As directed             Medication List    TAKE these medications        acetaminophen 325 MG tablet  Commonly known as:  TYLENOL  Take 2 tablets (650 mg total) by mouth every 6 (six) hours as needed for mild pain (or Fever >/= 101).     atorvastatin 40 MG tablet  Commonly known as:  LIPITOR  Take 1 tablet (40 mg total) by mouth daily.     atropine 1 % ophthalmic solution  Place 1 drop into the left eye 2 (two) times daily.     ciprofloxacin 500 MG tablet  Commonly known as:  CIPRO  Take 1 tablet (500 mg total) by mouth 2 (two) times daily.     cromolyn 4 % ophthalmic solution  Commonly known as:  OPTICROM  Place 1 drop into both eyes 4 (four) times daily.     diltiazem 120 MG 24 hr capsule  Commonly known as:  CARTIA XT  Take 1 capsule (120 mg total) by mouth daily.     DORZOLAMIDE HCL-TIMOLOL MAL OP   Place 1 drop into the left eye 2 (two) times daily.     dronabinol 2.5 MG capsule  Commonly known as:  MARINOL  Take 1-2 capsules (2.5-5 mg total) by mouth 2 (two) times daily before a meal.     enoxaparin 100 MG/ML injection  Commonly known as:  LOVENOX  Inject 1 mL (100 mg total) into the skin daily.     feeding supplement (ENSURE ENLIVE) Liqd  Take 237 mLs by mouth 2 (two) times daily between meals.     fentaNYL 12 MCG/HR  Commonly known as:  DURAGESIC - dosed mcg/hr  Place 1 patch (12.5 mcg total) onto the skin every 3 (three) days.     latanoprost 0.005 % ophthalmic solution  Commonly known as:  XALATAN  Place 1 drop into the right eye at bedtime.     mirtazapine 15 MG tablet  Commonly known as:  REMERON  Take 1 tablet (15 mg total) by mouth at bedtime.     multivitamin with minerals Tabs tablet  Take 2 tablets by mouth daily.     ondansetron 4 MG disintegrating tablet  Commonly known as:  ZOFRAN-ODT  DISSOLVE 1 TABLET(4 MG) ON THE TONGUE EVERY 8 HOURS AS NEEDED FOR NAUSEA OR VOMITING     oxyCODONE 30 MG 12 hr tablet  Take 30 mg by mouth every 12 (twelve) hours.     Oxycodone HCl 10 MG Tabs  Take 1 tablet (10 mg total) by mouth every 4 (four) hours as needed.     pantoprazole 40 MG tablet  Commonly known as:  PROTONIX  Take 1 tablet (40 mg total) by mouth 2 (two) times daily before a meal.     prednisoLONE acetate 1 % ophthalmic suspension  Commonly known as:  PRED FORTE  Place 1 drop into the left eye 4 (four) times daily.     primidone 50 MG tablet  Commonly known as:  MYSOLINE  TAKE 1 TABLET BY MOUTH EVERY NIGHT AT BEDTIME     prochlorperazine 10 MG tablet  Commonly known as:  COMPAZINE  Take 1 tablet (10 mg total) by mouth every 6 (six) hours as needed.     propranolol ER 120 MG 24 hr capsule  Commonly known as:  INDERAL LA  TAKE 1 CAPSULE(120 MG) BY MOUTH DAILY           Follow-up Information    Follow up with Mauricio Po, FNP. Schedule  an appointment as soon as possible for a visit in 1 week.   Specialty:  Family Medicine   Why:  Follow up appt after recent hospitalization   Contact information:   [place] [phone]        The results of significant diagnostics from this hospitalization (including imaging, microbiology, ancillary and laboratory) are listed below for reference.    Significant Diagnostic Studies: Ir Catheter Tube Change  11/30/2014  CLINICAL DATA:  Urinary retention, status post suprapubic catheter placement. Patient had a lot of pain with catheter exchange recently in the office. Up sizing to 16 [place] is requested. EXAM: SUPRAPUBIC  CATHETER EXCHANGE UNDER FLUOROSCOPY FLUOROSCOPY TIME:  0.7 minutes, 139  uG[employer]2 DAP TECHNIQUE: The procedure, risks (including but not limited to bleeding, infection, organ damage ), benefits, and alternatives were explained to the patient. Questions regarding the procedure were encouraged and answered. The patient understands and consents to the procedure. The previously placed suprapubic catheter and surrounding skin were prepped with Betadine, draped in usual sterile fashion. Region infiltrated with 1% lidocaine 10 mL. Viscous lidocaine was applied topically. Dilute contrast was injected through the catheter to partially distend the urinary bladder. The catheter was cut and exchanged over a 0.035" angiographic wire for serial dilators allowing advancement of a 16 French pigtail catheter, formed centrally within the urinary bladder. Injection confirms appropriate positioning. Catheter secured externally and a sterile dressing applied. The patient tolerated the procedure well. COMPLICATIONS: COMPLICATIONS none IMPRESSION: 1. Technically successful exchange and upsizing of suprapubic catheter to a 16 French device, under fluoroscopy. Electronically Signed   By: Eden Emms.D.  On: 11/30/2014 14:33    Microbiology: Recent Results (from the past 240  hour(s))  Urine culture     Status: None   Collection Time: 12/10/14  3:58 PM  Result Value Ref Range Status   Specimen Description URINE, RANDOM  Final   Special Requests NONE  Final   Culture   Final    MULTIPLE SPECIES PRESENT, SUGGEST RECOLLECTION Performed at [facility]    Report Status 12/12/2014 FINAL  Final     Labs: Basic Metabolic Panel:  Recent Labs Lab 12/09/14 1130 12/10/14 1715 12/11/14 0538 12/12/14 0817  NA 141 144 143 141  K 3.5 3.9 2.8* 3.3*  CL 102 109 112* 108  CO2 24 23 24 25   GLUCOSE 117* 95 94 119*  BUN 11 10 8 6   CREATININE 0.74 0.70 0.62 0.53  CALCIUM 9.6 8.8* 8.5* 8.7*   Liver Function Tests:  Recent Labs Lab 12/09/14 1130 12/10/14 1715  AST 65* 51*  ALT 19 12*  ALKPHOS 60 52  BILITOT 1.7* 1.2  PROT 7.8 6.8  ALBUMIN 3.6 3.2*    Recent Labs Lab 12/10/14 1715  LIPASE 20   No results for input(s): AMMONIA in the last 168 hours. CBC:  Recent Labs Lab 12/09/14 1130 12/10/14 1547 12/11/14 0538  WBC 6.1 6.5 5.7  NEUTROABS  --  4.5  --   HGB 15.2* 14.4 11.8*  HCT 45.3 44.0 35.9*  MCV 95.2 96.5 94.7  PLT 271 284 222   Cardiac Enzymes: No results for input(s): CKTOTAL, CKMB, CKMBINDEX, TROPONINI in the last 168 hours. BNP: BNP (last 3 results) No results for input(s): BNP in the last 8760 hours.  ProBNP (last 3 results) No results for input(s): PROBNP in the last 8760 hours.  CBG: No results for input(s): GLUCAP in the last 168 hours.

## 2014-12-15 ENCOUNTER — Telehealth: Payer: Self-pay | Admitting: *Deleted

## 2014-12-15 ENCOUNTER — Ambulatory Visit
Admission: RE | Admit: 2014-12-15 | Discharge: 2014-12-15 | Disposition: A | Discharge status: Home / Self Care | Payer: Medicare Other | Source: Ambulatory Visit | Attending: Radiation Oncology | Admitting: Radiation Oncology

## 2014-12-15 ENCOUNTER — Ambulatory Visit: Payer: Medicare Other

## 2014-12-15 DIAGNOSIS — Z510 Encounter for antineoplastic radiation therapy: Secondary | ICD-10-CM | POA: Diagnosis not present

## 2014-12-15 DIAGNOSIS — Z9081 Acquired absence of spleen: Secondary | ICD-10-CM | POA: Diagnosis not present

## 2014-12-15 DIAGNOSIS — C252 Malignant neoplasm of tail of pancreas: Secondary | ICD-10-CM | POA: Diagnosis present

## 2014-12-15 DIAGNOSIS — Z9049 Acquired absence of other specified parts of digestive tract: Secondary | ICD-10-CM | POA: Diagnosis not present

## 2014-12-15 DIAGNOSIS — Z90410 Acquired total absence of pancreas: Secondary | ICD-10-CM | POA: Diagnosis not present

## 2014-12-15 NOTE — Telephone Encounter (Signed)
Called pt to set up TCM appt no answer LMOM RTC.../lmb 

## 2014-12-16 ENCOUNTER — Ambulatory Visit
Admission: RE | Admit: 2014-12-16 | Discharge: 2014-12-16 | Disposition: A | Discharge status: Home / Self Care | Payer: Medicare Other | Source: Ambulatory Visit | Attending: Radiation Oncology | Admitting: Radiation Oncology

## 2014-12-16 ENCOUNTER — Ambulatory Visit: Payer: Medicare Other

## 2014-12-16 DIAGNOSIS — Z510 Encounter for antineoplastic radiation therapy: Secondary | ICD-10-CM | POA: Diagnosis not present

## 2014-12-16 NOTE — Telephone Encounter (Signed)
Pt daughter (Geralda) called back completed TCM below.../lmb  Transition Care Management Follow-up Telephone Call   Date discharged? 12/14/14   How have you been since you were released from the hospital? Daughter states mom is doing ok   Do you understand why you were in the hospital? YES   Do you understand the discharge instructions? YES   Where were you discharged to? Home   Items Reviewed:  Medications reviewed: YES  Allergies reviewed: YES  Dietary changes reviewed: YES  Referrals reviewed: No referral needed   Functional Questionnaire:   Activities of Daily Living (ADLs):   She states mom are independent in the following: ambulation, bathing and hygiene, feeding, continence, grooming, toileting and dressing States she doesn't require assistance    Any transportation issues/concerns?: NO   Any patient concerns? NO   Confirmed importance and date/time of follow-up visits scheduled YES, appt 12/21/14  Provider Appointment booked with greg calone  Confirmed with patient if condition begins to worsen call PCP or go to the ER.  Patient was given the office number and encouraged to call back with question or concerns.  : YES

## 2014-12-17 ENCOUNTER — Telehealth: Payer: Self-pay | Admitting: Hematology

## 2014-12-17 ENCOUNTER — Ambulatory Visit
Admission: RE | Admit: 2014-12-17 | Discharge: 2014-12-17 | Disposition: A | Discharge status: Home / Self Care | Payer: Medicare Other | Source: Ambulatory Visit | Attending: Radiation Oncology | Admitting: Radiation Oncology

## 2014-12-17 ENCOUNTER — Ambulatory Visit: Payer: Medicare Other

## 2014-12-17 ENCOUNTER — Ambulatory Visit (HOSPITAL_BASED_OUTPATIENT_CLINIC_OR_DEPARTMENT_OTHER): Payer: Medicare Other | Admitting: Hematology

## 2014-12-17 ENCOUNTER — Other Ambulatory Visit (HOSPITAL_BASED_OUTPATIENT_CLINIC_OR_DEPARTMENT_OTHER): Payer: Medicare Other

## 2014-12-17 ENCOUNTER — Ambulatory Visit (HOSPITAL_BASED_OUTPATIENT_CLINIC_OR_DEPARTMENT_OTHER): Payer: Medicare Other

## 2014-12-17 VITALS — BP 95/55 | HR 71 | Temp 97.7°F | Resp 18 | Ht 69.0 in | Wt 154.2 lb

## 2014-12-17 DIAGNOSIS — I82409 Acute embolism and thrombosis of unspecified deep veins of unspecified lower extremity: Secondary | ICD-10-CM | POA: Diagnosis not present

## 2014-12-17 DIAGNOSIS — E860 Dehydration: Secondary | ICD-10-CM

## 2014-12-17 DIAGNOSIS — E440 Moderate protein-calorie malnutrition: Secondary | ICD-10-CM

## 2014-12-17 DIAGNOSIS — C252 Malignant neoplasm of tail of pancreas: Secondary | ICD-10-CM | POA: Diagnosis present

## 2014-12-17 DIAGNOSIS — R112 Nausea with vomiting, unspecified: Secondary | ICD-10-CM | POA: Diagnosis not present

## 2014-12-17 DIAGNOSIS — Z510 Encounter for antineoplastic radiation therapy: Secondary | ICD-10-CM | POA: Diagnosis not present

## 2014-12-17 LAB — CBC WITH DIFFERENTIAL/PLATELET
BASO%: 0.7 % (ref 0.0–2.0)
BASOS ABS: 0 10*3/uL (ref 0.0–0.1)
EOS ABS: 0.1 10*3/uL (ref 0.0–0.5)
EOS%: 2 % (ref 0.0–7.0)
HEMATOCRIT: 34.9 % (ref 34.8–46.6)
HEMOGLOBIN: 11.6 g/dL (ref 11.6–15.9)
LYMPH#: 0.8 10*3/uL — AB (ref 0.9–3.3)
LYMPH%: 14.4 % (ref 14.0–49.7)
MCH: 31.2 pg (ref 25.1–34.0)
MCHC: 33.1 g/dL (ref 31.5–36.0)
MCV: 94.3 fL (ref 79.5–101.0)
MONO#: 0.5 10*3/uL (ref 0.1–0.9)
MONO%: 9.8 % (ref 0.0–14.0)
NEUT%: 73.1 % (ref 38.4–76.8)
NEUTROS ABS: 3.8 10*3/uL (ref 1.5–6.5)
Platelets: 199 10*3/uL (ref 145–400)
RBC: 3.7 10*6/uL (ref 3.70–5.45)
RDW: 15.9 % — AB (ref 11.2–14.5)
WBC: 5.2 10*3/uL (ref 3.9–10.3)

## 2014-12-17 LAB — COMPREHENSIVE METABOLIC PANEL (CC13)
ALBUMIN: 2.2 g/dL — AB (ref 3.5–5.0)
ALK PHOS: 53 U/L (ref 40–150)
ALT: 16 U/L (ref 0–55)
AST: 69 U/L — AB (ref 5–34)
Anion Gap: 6 mEq/L (ref 3–11)
BILIRUBIN TOTAL: 0.33 mg/dL (ref 0.20–1.20)
BUN: 5.5 mg/dL — AB (ref 7.0–26.0)
CALCIUM: 8.7 mg/dL (ref 8.4–10.4)
CO2: 27 mEq/L (ref 22–29)
Chloride: 109 mEq/L (ref 98–109)
Creatinine: 0.6 mg/dL (ref 0.6–1.1)
EGFR: >90 mL/min/{1.73_m2} (ref >90)
GLUCOSE: 114 mg/dL (ref 70–140)
Potassium: 3.2 mEq/L — ABNORMAL LOW (ref 3.5–5.1)
SODIUM: 142 meq/L (ref 136–145)
TOTAL PROTEIN: 5.5 g/dL — AB (ref 6.4–8.3)

## 2014-12-17 MED ORDER — MORPHINE SULFATE 4 MG/ML IJ SOLN
2.0000 mg | Freq: Once | INTRAMUSCULAR | Status: AC
Start: 2014-12-17 — End: 2014-12-17
  Administered 2014-12-17: 2 mg via INTRAVENOUS
  Filled 2014-12-17: qty 1

## 2014-12-17 MED ORDER — MORPHINE SULFATE (PF) 4 MG/ML IV SOLN
INTRAVENOUS | Status: AC
  Filled 2014-12-17: qty 1

## 2014-12-17 MED ORDER — SODIUM CHLORIDE 0.9 % IV SOLN
1000.0000 mL | Freq: Once | INTRAVENOUS | Status: AC
  Administered 2014-12-17: 1000 mL via INTRAVENOUS

## 2014-12-17 NOTE — Progress Notes (Signed)
Weekly rad txs abdomen,  9/10 completed, c/o gas, no pain, no nausea, last Bm yesterday  Per daughter,mild constipation,  sent patient to see Dr. Burr Medico before completing assessment will see Dr. Lisbeth Renshaw after seen by Dr. Burr Medico, Patient  Back from Dr. Burr Medico office, pain in abdomen 5/10 scale, has 12.5 mcg patch on abdomen, appetite improving, vitals and weight done in Medical Oncology 1:45 PM Wt Readings from Last 3 Encounters:  12/17/14 154 lb 3.2 oz (69.945 kg)  12/10/14 139 lb 8.8 oz (63.3 kg)  12/09/14 139 lb 4.8 oz (63.186 kg)  12:10 pm

## 2014-12-17 NOTE — Telephone Encounter (Signed)
Add to previous note patient/relative instructed per desk nurse upon checking out to check back after appointment with Dr. Lisbeth [name] to see if IVF's could be added for today.

## 2014-12-17 NOTE — Progress Notes (Signed)
  Radiation Oncology         ([phone]) [phone] ________________________________  Name: OAKLYN DANSBY MRN: [id number]  Date: 12/01/2014  DOB: 11/15/1945  SIMULATION AND TREATMENT PLANNING NOTE  DIAGNOSIS:  Pancreatic cancer    ICD-9-CM ICD-10-CM   1. 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  Identity was confirmed.  All relevant records and images related to the planned course of therapy were reviewed.   Written consent to proceed with treatment was confirmed which was freely given after reviewing the details related to the planned course of therapy had been reviewed with the patient.  Then, the patient was set-up in a stable reproducible  supine position for radiation therapy.  CT images were obtained.  Surface markings were placed.    Medically necessary complex treatment device(s) for immobilization:  Customized vac-lock bag.   The CT images were loaded into the planning software.  Then the target and avoidance structures were contoured.  Treatment planning then occurred.  The radiation prescription was entered and confirmed.  A total of 5 complex treatment devices were fabricated which relate to the designed radiation treatment fields. Each of these customized fields/ complex treatment devices will be used on a daily basis during the radiation course. I have requested : 3D Simulation  I have requested a DVH of the following structures: target, cord, kidneys, liver.   The patient will undergo daily image guidance to ensure accurate localization of the target, and adequate minimize dose to the normal surrounding structures in close proximity to the target.   PLAN:  The patient will receive 30 Gy in 10 fractions.  ________________________________   Jodelle Gross, MD, PhD

## 2014-12-17 NOTE — Patient Instructions (Signed)

## 2014-12-17 NOTE — Telephone Encounter (Signed)
Gave patient/relative avs report and appointments for December. Per 11/18 pof cxd [id number] ct - s/w [facility]. Lab on 12/14 r/s to 12/9 with 3 week f/u.

## 2014-12-17 NOTE — Progress Notes (Signed)
Department of Radiation Oncology  Phone:  [phone] Fax:        [phone]  Weekly Treatment Note    Name: [name] Date: 12/17/2014 MRN: [id number] DOB: 06-21-1945   Current dose: 27 Gy  Current fraction: 9   MEDICATIONS: Current Outpatient Prescriptions  Medication Sig Dispense Refill  . acetaminophen (TYLENOL) 325 MG tablet Take 2 tablets (650 mg total) by mouth every 6 (six) hours as needed for mild pain (or Fever >/= 101). 30 tablet 0  . atorvastatin (LIPITOR) 40 MG tablet Take 1 tablet (40 mg total) by mouth daily. 90 tablet 0  . atropine 1 % ophthalmic solution Place 1 drop into the left eye 2 (two) times daily.    . ciprofloxacin (CIPRO) 500 MG tablet Take 1 tablet (500 mg total) by mouth 2 (two) times daily. 10 tablet 0  . cromolyn (OPTICROM) 4 % ophthalmic solution Place 1 drop into both eyes 4 (four) times daily.     Marland Kitchen diltiazem (CARTIA XT) 120 MG 24 hr capsule Take 1 capsule (120 mg total) by mouth daily. 90 capsule 0  . DORZOLAMIDE HCL-TIMOLOL MAL OP Place 1 drop into the left eye 2 (two) times daily.     Marland Kitchen dronabinol (MARINOL) 2.5 MG capsule Take 1-2 capsules (2.5-5 mg total) by mouth 2 (two) times daily before a meal. 60 capsule 1  . enoxaparin (LOVENOX) 100 MG/ML injection Inject 1 mL (100 mg total) into the skin daily. 30 Syringe 3  . feeding supplement, ENSURE ENLIVE, (ENSURE ENLIVE) LIQD Take 237 mLs by mouth 2 (two) times daily between meals. (Patient not taking: Reported on 12/17/2014) 237 mL 12  . fentaNYL (DURAGESIC - DOSED MCG/HR) 12 MCG/HR Place 1 patch (12.5 mcg total) onto the skin every 3 (three) days. 5 patch 0  . latanoprost (XALATAN) 0.005 % ophthalmic solution Place 1 drop into the right eye at bedtime.     . mirtazapine (REMERON) 15 MG tablet Take 1 tablet (15 mg total) by mouth at bedtime. 90 tablet 1  . Multiple Vitamin (MULTIVITAMIN WITH MINERALS) TABS tablet Take 2 tablets by mouth daily.    . ondansetron (ZOFRAN-ODT) 4 MG  disintegrating tablet DISSOLVE 1 TABLET(4 MG) ON THE TONGUE EVERY 8 HOURS AS NEEDED FOR NAUSEA OR VOMITING 20 tablet 0  . oxyCODONE 30 MG 12 hr tablet Take 30 mg by mouth every 12 (twelve) hours. 60 each 0  . Oxycodone HCl 10 MG TABS Take 1 tablet (10 mg total) by mouth every 4 (four) hours as needed. 90 tablet 0  . pantoprazole (PROTONIX) 40 MG tablet Take 1 tablet (40 mg total) by mouth 2 (two) times daily before a meal. 90 tablet 3  . prednisoLONE acetate (PRED FORTE) 1 % ophthalmic suspension Place 1 drop into the left eye 4 (four) times daily.     . primidone (MYSOLINE) 50 MG tablet TAKE 1 TABLET BY MOUTH EVERY NIGHT AT BEDTIME 90 tablet 0  . prochlorperazine (COMPAZINE) 10 MG tablet Take 1 tablet (10 mg total) by mouth every 6 (six) hours as needed. 30 tablet 3  . propranolol ER (INDERAL LA) 120 MG 24 hr capsule TAKE 1 CAPSULE(120 MG) BY MOUTH DAILY 30 capsule 0   No current facility-administered medications for this encounter.     ALLERGIES: Sulfa antibiotics   LABORATORY DATA:  Lab Results  Component Value Date   WBC 5.2 12/17/2014   HGB 11.6 12/17/2014   HCT 34.9 12/17/2014   MCV 94.3 12/17/2014  PLT 199 12/17/2014   Lab Results  Component Value Date   NA 142 12/17/2014   K 3.2* 12/17/2014   CL 108 12/12/2014   CO2 27 12/17/2014   Lab Results  Component Value Date   ALT 16 12/17/2014   AST 69* 12/17/2014   ALKPHOS 53 12/17/2014   BILITOT 0.33 12/17/2014     NARRATIVE: [name] was seen today for weekly treatment management. The chart was checked and the patient's films were reviewed.  The patient mentions that she has gas but denies pain and nausea. Her last bowel movement was yesterday. Her daughter says that she has mild constipation. She says that the pain in her abdomen is a 5/10 and has a 12.5 mcg patch on her abdomen. Her appetite is improving.  PHYSICAL EXAMINATION: vitals were not taken for this visit.    BP Pulse Temp(Src) Resp Ht Wt    95/55  mmHg 71 97.7 F (36.5 C) (Oral) 18 5\' 9"  (1.753 m) 154 lb 3.2 oz (69.945 kg)    BMI SpO2            22.76 kg/m2 99%         Wt Readings from Last 3 Encounters:  12/17/14  154 lb 3.2 oz (69.945 kg)  12/10/14  139 lb 8.8 oz (63.3 kg)  12/09/14  139 lb 4.8 oz (63.186 kg)    ASSESSMENT: The patient is doing satisfactorily with treatment.  PLAN: We will continue with the patient's radiation treatment as planned. She will follow up with me in a month.       This document serves as a record of services personally performed by Kyung Rudd, MD. It was created on his behalf by  Lendon Collar, a trained medical scribe. The creation of this record is based on the scribe's personal observations and the Jevin Camino's statements to them. This document has been checked and approved by the attending Tristin Vandeusen.

## 2014-12-19 ENCOUNTER — Encounter: Payer: Self-pay | Admitting: Hematology

## 2014-12-19 NOTE — Progress Notes (Signed)
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 CT chest was negative.    02/26/2014 Pathology Results FNA of pancreatic mass showed adenocarcinoma    03/22/2014 Initial Diagnosis Pancreatic adenocarcinoma   04/08/2014 Surgery robotic converted to hand-assisted laparoscopic distal pancreatectomy, splenectomy, partial colectomy, and partial removal of the left renal capsule for pancreatic adenocarcinoma. Surgical margins were negative. pT3N0   04/08/2014 - 05/04/2014 Hospital Admission admitted for pancreatic surgery, complicated with abdominal abcess, which was drained and treated with abx for pseudomonas infection, difficulty with eating and ambulatory, malnutrition, was discharged to rehab     04/08/2014 Pathology Results pT3N0, moderately differentiated invasive adenocarcinoma, spanning 4.5 cm, invading colonic submucosa. Lymphovascular invasion is identified, perineural invasion is identified. 10 lymph nodes were negative. Surgical margins were negative.   05/09/2014 - 05/21/2014 Hospital Admission Pt was admited for chest and abdominal pain, found to have left side pleural effusion. thoracentesis  done by IR with 549ml fluids removed, cytology (-) malignant cells   10/02/2014 Imaging CT CAP showed a two new soft tissue versus cystic structures in the peripancreatic region, decresased left pleural effusion    11/05/2014 Pathology Results EGD/EUS needle biopsy of the soft tissue in the peripancreatic region showed adenocarcinoma     HISTORY OF PRESENTING ILLNESS:  [name] 69 y.o. female is here because of her recurrent DVT and incidental discovered pancreatic mass.  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 She had IVC filter placed back then and put on coumadin. She had second episode of right leg DVT when she was on coumadin in 2009, and continued coumadin until one year ago when she had trouble for monitoring her INR. She was then switched to Aspirin 81mg  dialy. 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 CT abdomen was ordered to further evaluate the DVT and abdominal pain on 12/22/2013, which showed thrombus in IVC and right common iliac vein below IVC. CT scan also showed a 2.8 x 2.5 cm mass within the tail of pancreas, this was confirmed by the MRI abdomen, with probably encasement of the splenic vessels. No significant abdominal adenopathy. CT of the chest was negative for distant metastasis. She was referred to see GI Dr.Hung and will have EGD/EUS for pancreatic mass biopsy in a week.   She has been having abdominal pain after eating in the pat few month, along with diarrhea and constipation, which has been chronic since her cholecystectomy in 199. Her appetite  is fare, and weight is stable. She also has tremor since teenager, and got worse lately, along with gait instaedyness, was seen by neurologist Dr. Carles Collet. She was put on primidone  which has help quite bit.   She had both knee surgery and was using a crane until recently when she was diagnosed with DVT and she has been using a walker. She has been having some gait instability lately, she was referred by Dr. Carles Collet and MRI of brain showed mild chronic small vessel ischemia disease and cerebral atrophy.  CURRENT THERAPY: supportive care,  palliative radiation 12/06/14-12/20/14   INTERIM HISTORY [name] returns for follow-up with her daughter. She was admitted to hospital 12/10/2014 for UTI and N/V. She was treated with antibiotics and IVF, discharged hone on 11/15. She missed one day of radiation, and will finish next Monday. She overall feels slightly better this week, she has been using the fentanyl patch, and a few times she used additional OxyContin. He also taking oxycodone as needed for her pain. Her appetite has been slightly better, she is eating a little bit more. She tolerating the radiation well. She is still very fatigued, not able to do much at home.   MEDICAL HISTORY:  No past medical history on file.  SURGICAL HISTORY: Past Surgical History  Procedure Laterality Date  . Cholecystectomy    . Abdominal hysterectomy    . Knee surgery Bilateral     scope  . [place] filter    . Back surgery    . Eus N/A 02/26/2014    Procedure: UPPER ENDOSCOPIC ULTRASOUND (EUS) LINEAR;  Surgeon: Beryle Beams, MD;  Location: [facility];  Service: Endoscopy;  Laterality: N/A;  . Fine needle aspiration N/A 02/26/2014    Procedure: FINE NEEDLE ASPIRATION (FNA) LINEAR;  Surgeon: Beryle Beams, MD;  Location: [facility];  Service: Endoscopy;  Laterality: N/A;  . Breast lumpectomy Right   . Spine surgery    . Xi robotic assisted laparoscopic distal pancreatectomy N/A 04/08/2014    Procedure: XI ROBOTIC ASSISTED CONVERTED LAPAROSCOPIC HAND ASSITED DISTAL PANCREATECTOMY/SPLENECTOMY WITH PARTIAL COLECTOMY;  Surgeon: [name], MD;  Location: [facility];  Service: General;   Laterality: N/A;  . Colon surgery    . Diagnostic laparoscopy    . Cataract extraction      left eye  . Eus N/A 11/05/2014    Procedure: FULL UPPER ENDOSCOPIC ULTRASOUND (EUS) RADIAL;  Surgeon: Carol Ada, MD;  Location: [facility];  Service: Endoscopy;  Laterality: N/A;  . Fine needle aspiration N/A 11/05/2014    Procedure: FINE NEEDLE ASPIRATION (FNA) LINEAR;  Surgeon: Carol Ada, MD;  Location: [facility];  Service: Endoscopy;  Laterality: N/A;    SOCIAL HISTORY: History   Social History  . Marital Status: Divorced    Spouse Name: N/A    Number of Children: 3  . Years of Education: N/A   Occupational History   She used to work for [employer], retired now    Social History Main Topics  . Smoking status: Current Every Day Smoker -- 0.50 packs/day, for 40 years     Types: Cigarettes  . Smokeless tobacco: Not on file  . Alcohol Use: 0.0 oz/week, social drinker     0 Not specified per week     Comment: occ- glass of wine a week   . Drug Use: No  . Sexual Activity: Not on file   Other Topics Concern  . Not on file   Social History Narrative  Divorced, 3 children   Right handed   Associates degree   3 per week    FAMILY HISTORY: Family History  Problem Relation Age of Onset  . Diabetes Father 77  . Diabetes Maternal Grandmother   . Hypertension Mother   . Glaucoma Sister   . Cataracts Maternal Grandmother   . Cancer Sister 74    Breast    ALLERGIES:  is allergic to sulfa antibiotics.  MEDICATIONS:  Current Outpatient Prescriptions  Medication Sig Dispense Refill  . acetaminophen (TYLENOL) 325 MG tablet Take 2 tablets (650 mg total) by mouth every 6 (six) hours as needed for mild pain (or Fever >/= 101). 30 tablet 0  . atorvastatin (LIPITOR) 40 MG tablet Take 1 tablet (40 mg total) by mouth daily. 90 tablet 0  . atropine 1 % ophthalmic solution Place 1 drop into the left eye 2 (two) times daily.    . ciprofloxacin (CIPRO) 500 MG tablet Take  1 tablet (500 mg total) by mouth 2 (two) times daily. 10 tablet 0  . cromolyn (OPTICROM) 4 % ophthalmic solution Place 1 drop into both eyes 4 (four) times daily.     Marland Kitchen diltiazem (CARTIA XT) 120 MG 24 hr capsule Take 1 capsule (120 mg total) by mouth daily. 90 capsule 0  . DORZOLAMIDE HCL-TIMOLOL MAL OP Place 1 drop into the left eye 2 (two) times daily.     Marland Kitchen dronabinol (MARINOL) 2.5 MG capsule Take 1-2 capsules (2.5-5 mg total) by mouth 2 (two) times daily before a meal. 60 capsule 1  . enoxaparin (LOVENOX) 100 MG/ML injection Inject 1 mL (100 mg total) into the skin daily. 30 Syringe 3  . fentaNYL (DURAGESIC - DOSED MCG/HR) 12 MCG/HR Place 1 patch (12.5 mcg total) onto the skin every 3 (three) days. 5 patch 0  . latanoprost (XALATAN) 0.005 % ophthalmic solution Place 1 drop into the right eye at bedtime.     . mirtazapine (REMERON) 15 MG tablet Take 1 tablet (15 mg total) by mouth at bedtime. 90 tablet 1  . Multiple Vitamin (MULTIVITAMIN WITH MINERALS) TABS tablet Take 2 tablets by mouth daily.    . ondansetron (ZOFRAN-ODT) 4 MG disintegrating tablet DISSOLVE 1 TABLET(4 MG) ON THE TONGUE EVERY 8 HOURS AS NEEDED FOR NAUSEA OR VOMITING 20 tablet 0  . oxyCODONE 30 MG 12 hr tablet Take 30 mg by mouth every 12 (twelve) hours. 60 each 0  . Oxycodone HCl 10 MG TABS Take 1 tablet (10 mg total) by mouth every 4 (four) hours as needed. 90 tablet 0  . pantoprazole (PROTONIX) 40 MG tablet Take 1 tablet (40 mg total) by mouth 2 (two) times daily before a meal. 90 tablet 3  . prednisoLONE acetate (PRED FORTE) 1 % ophthalmic suspension Place 1 drop into the left eye 4 (four) times daily.     . primidone (MYSOLINE) 50 MG tablet TAKE 1 TABLET BY MOUTH EVERY NIGHT AT BEDTIME 90 tablet 0  . prochlorperazine (COMPAZINE) 10 MG tablet Take 1 tablet (10 mg total) by mouth every 6 (six) hours as needed. 30 tablet 3  . propranolol ER (INDERAL LA) 120 MG 24 hr capsule TAKE 1 CAPSULE(120 MG) BY MOUTH DAILY 30 capsule 0  .  feeding supplement, ENSURE ENLIVE, (ENSURE ENLIVE) LIQD Take 237 mLs by mouth 2 (two) times daily between meals. (Patient not taking: Reported on 12/17/2014) 237 mL 12   No current facility-administered medications for this visit.    REVIEW OF SYSTEMS:  Constitutional: Denies fevers, chills or abnormal night sweats, (+) fatigue  Eyes: Denies blurriness of vision, double vision or watery eyes Ears, nose, mouth, throat, and face: Denies mucositis or sore throat Respiratory: Denies cough, dyspnea or wheezes Cardiovascular: Denies palpitation, chest discomfort or lower extremity swelling Gastrointestinal:  (+) nausea and vomiting, no heartburn or change in bowel habits. (+) worsening abdominal pain  Skin: Denies abnormal skin rashes Lymphatics: Denies new lymphadenopathy or easy bruising Neurological:Denies numbness, tingling or new weaknesses Behavioral/Psych: Mood is stable, no new changes  Extremities: Bilateral lower extremity edema and leg pain, left more than right. All other systems were reviewed with the patient and are negative.  PHYSICAL EXAMINATION: ECOG PERFORMANCE STATUS: 3 - Symptomatic, >50% confined to bed  Filed Vitals:   12/17/14 1250  BP: 95/55  Pulse: 71  Temp: 97.7 F (36.5 C)  Resp: 18   Filed Weights   12/17/14 1250  Weight: 154 lb 3.2 oz (69.945 kg)    GENERAL:alert, no distress and comfortable, sitting in wheelchair.  SKIN: skin color, texture, turgor are normal, no rashes. She has some bruises on her abdomen at the Lovenox injection site. She also has a 2 cm subcutaneous nodule on the right flank area, likely a hematoma. EYES: normal, conjunctiva are pink and non-injected, sclera clear OROPHARYNX:no exudate, no erythema and lips, buccal mucosa, and tongue normal  NECK: supple, thyroid normal size, non-tender, without nodularity LYMPH:  no palpable lymphadenopathy in the cervical, axillary or inguinal LUNGS: clear to auscultation and percussion with  normal breathing effort HEART: regular rate & rhythm and no murmurs and no lower extremity edema ABDOMEN:abdomen soft, mild tenderness at mid and low abdomen, normal bowel sounds Musculoskeletal:no cyanosis of digits and no clubbing  PSYCH: alert & oriented x 3 with fluent speech NEURO: no focal motor/sensory deficits Ext: (+) Bilateral Ankle swelling  LABORATORY DATA:  I have reviewed the data as listed CBC Latest Ref Rng 12/17/2014 12/11/2014 12/10/2014  WBC 3.9 - 10.3 10e3/uL 5.2 5.7 6.5  Hemoglobin 11.6 - 15.9 g/dL 11.6 11.8(L) 14.4  Hematocrit 34.8 - 46.6 % 34.9 35.9(L) 44.0  Platelets 145 - 400 10e3/uL 199 222 284     CMP Latest Ref Rng 12/17/2014 12/12/2014 12/11/2014  Glucose 70 - 140 mg/dl 114 119(H) 94  BUN 7.0 - 26.0 mg/dL 5.5(L) 6 8  Creatinine 0.6 - 1.1 mg/dL 0.6 0.53 0.62  Sodium 136 - 145 mEq/L 142 141 143  Potassium 3.5 - 5.1 mEq/L 3.2(L) 3.3(L) 2.8(L)  Chloride 101 - 111 mmol/L - 108 112(H)  CO2 22 - 29 mEq/L 27 25 24   Calcium 8.4 - 10.4 mg/dL 8.7 8.7(L) 8.5(L)  Total Protein 6.4 - 8.3 g/dL 5.5(L) - -  Total Bilirubin 0.20 - 1.20 mg/dL 0.33 - -  Alkaline Phos 40 - 150 U/L 53 - -  AST 5 - 34 U/L 69(H) - -  ALT 0 - 55 U/L 16 - -     PATHOLOGY REPORT 04/08/2014 REASON FOR ADDENDUM, AMENDMENT OR CORRECTION: [id number]: AMENDMENT: Amendment issued to report additional gross information in part #2. ds 04/14/14 04:55:45 PM FINAL DIAGNOSIS Diagnosis 04/08/2014 1. Colon, segmental resection, transverse - PANCREATIC ADENOCARCINOMA INVADING COLONIC SUBMUCOSA. - LYMPHOVASCULAR INVASION IS IDENTIFIED. - THE COLONIC RESECTION MARGINS ARE NEGATIVE FOR ADENOCARCINOMA. 2. Pancreas, resection - INVASIVE ADENOCARCINOMA, MODERATELY DIFFERENTIATED, SPANNING 4.5 CM. - LYMPHOVASCULAR INVASION IS IDENTIFIED. - PERINEURAL INVASION IS IDENTIFIED. - ADENOCARCINOMA IS BROADLY PRESENT AT T 10 mg 1 negative. Surgical margins were negative. HE CLINICAL COLONIC MARGIN  OF  SPECIMEN #2. - ADENOCARCINOMA IS ADHERENT TO THE RENAL CAPSULE, BUT DOES NOT INVOLVE RENAL PARENCHYMA. - ADENOCARCINOMA IS ADHERENT TO THE SPLENIC CAPSULE, BUT DOES NOT INVOLVE SPLENIC PARENCHYMA. - THE PANCREATIC PARENCHYMAL RESECTION MARGIN IS NEGATIVE FOR ADENOCARCINOMA. - THERE IS NO EVIDENCE OF CARCINOMA IN 10 OF 10 LYMPH NODES (0/10). - SEE ONCOLOGY TABLE BELOW. Microscopic Comment 2. PANCREAS (EXOCRINE): Procedure: Distal pancreatectomy with adherent renal capsule and portion of spleen and transverse colon resection. Tumor Site: Distal pancreatic tail. Tumor Size: 4.5 cm (gross measurement). Histologic Type: Adenocarcinoma. Histologic Grade (ductal carcinoma only): G2: Moderately differentiated Microscopic Tumor Extension: Adenocarcinoma involves colonic submucosa, renal capsule, and splenic capsule. Margins: The pancreatic parenchymal margin is negative for adenocarcinoma. Distance of invasive carcinoma from closest margin: 5.2 cm to the pancreatic parenchymal margin, see comment. Carcinoma in situ / high-grade dysplasia: Not identified. Treatment Effect: N/A. 1 of 3 Amended copy Amended FINAL for Bechtol, Viki A [id number]) Microscopic Comment(continued) Lymph-Vascular Invasion: Present, diffuse Perineural Invasion: Present, diffuse Lymph nodes: number examined 10; number positive: 0. Pathologic Staging: pT3, pN0. Additional Pathologic Findings: No significant findings. Ancillary Studies: N/A. Comment(s): There is a primary 4.5 cm moderately differentiated pancreatic adenocarcinoma present in the tail of the pancreas. There is direct extension of this tumor to the submucosa of the transverse colon (specimen #1). In addition, adenocarcinoma extends to and involves the renal capsule and splenic capsule. Despite four lymph nodes being negative for parenchymal metastases, one of the four lymph nodes does show perinodal deposits of adenocarcinoma. The adenocarcinoma present in  the submucosa of the transverse colon, renal capsule, splenic capsule, and perinodal soft tissue is interpreted to represent direct extension of the primary tumor, rather than metastatic sites and thus, the tumor is staged as above.  Comment There are malignant cells with prominent nucleili and intracytoplasmic vacuoles, arranged in acinar pattern. The overall morphologic findings favor a diagnosis of well differentiated adenocarcinoma.   Diagnosis 11/05/2014 FINE NEEDLE ASPIRATION, ENDOSCOPIC PANCREATIC TAIL (SPECIMEN 1 OF 1, COLLECTED ON 11/05/14): MALIGNANT CELLS PRESENT SEE COMMENT COMMENT: THE MALIGNANT CELLS ARE CONSISTENT WITH ADENOCARCINOMA. THE CASE WAS REVIEWED BY DR Tresa Moore, WHO CONCURS.   RADIOGRAPHIC STUDIES: I have personally reviewed the radiological images as listed and agreed with the findings in the report.  Ct Abdomen Pelvis W Contrast 10/02/2014    IMPRESSION: Foley catheter within thick-walled bladder, which may indicate infection/cystitis.  Two new soft tissue versus cystic structures in the peripancreatic region since 06/15/2014. Although these may represent pseudocysts, recurrent neoplasm is not entirely excluded and close interval follow-up or further evaluation is recommended.  Decreasing left pleural effusion which now appears slightly loculated. CT chest 10/07/2014 IMPRESSION: 1. Stable small left pleural effusion with associated passive atelectasis. No new lung nodule or other significant new findings in the chest. Stable small left internal mammary node warrants observation.   ASSESSMENT & PLAN:  69 year old female, with past medical history of recurrent bilateral lower extremity DVTs, presented with acute extensive left lower extremity DVT and thrombus in the IVC and the right, iliac vein, and chronic DVT in the right popliteal vein. She was also found to have a pancreatic tail mass on the CT scan.  1. Pancreatic adenocarcinoma, pT3 N0 M0, stage  IIA, local recurrence in 09/2014  - Her restaging CT scan from 10/02/2014 showed two small soft tissue vs cystic lesion in pancrease. -I discussed her biopsy results with patient and her daughter in details. Unfortunately she has local recurrence I'll check with her surgeon  Dr. Barry Dienes, but unlikely she will offer more surgery.  -We reviewed her case in our tumor board. Unfortunately this is likely incurable disease.  -She was seen by radiation oncologist Dr. Lisbeth Renshaw, who offered palliative radiation for pain control, she is going to finished next Friday 11/18. -Given her rapid deterioration, she is unlikely to be a candidate for systemic chemotherapy after radiation. -We discussed the goal of care with her and her daughter today, they agree with palliative approach to preserve her quality of life. -I discussed the option of home palliative care and hospice, she agrees with home care, I will set up for her with IVF 1L  Weekly at home and as needed, starting from next week after she completes RT.   2. Abdominal pain due to local recurrent pancreatic cancer -her pain is better controlled now, not optimal yet  -continue fentanyl patch 12.10mcg/hr, she will also use OxyContin if pain not sufficiently controlled (she still has some at home), and oxycodone as needed for breakthrough pain -I also refilled her oxycodone   3. Recurrent DVT  -I reviewed her lab results of prothrombin gene mutation, factor V Leyden mutation, and antithrombin III, which were all negative.  -Giving her recurrent unprovoked DVT, I recommend anticoagulation indefinitely. -she has some pain at injection sites, we discussed other A/C options, such as coumadin and xarelto, due to the cost issue, she wants to remain on lovenox   -continue lovenox 100 mg once daily. Her renal function is normal  4. Nausea, Anorexia, weight loss  -Given her poor by mouth intake and nausea, I'll give her IV fluids with antiemesis and IV morphine  today. -continue Marinol 2.5 mg twice a day, may increase on next visit  -continue Compazine and Zofran -I encouraged her to continue nutrition supplement  5. Code status -was discussed with patient and her daughter last time. I recommended DO NOT RESUSCITATE and DO NOT INTUBATE, she will think about it.  Plan:  -IVF today with iv morphine today -Set up home care for IV fluids at home starting next week -RTC in 2-3 weeks.  All questions were answered. The patient knows to call the clinic with any problems, questions or concerns. I spent 20 minutes counseling the patient face to face. The total time spent in the appointment was 25 minutes and more than 50% was on counseling.     Truitt Merle, MD 12/17/2014   3:19 PM

## 2014-12-20 ENCOUNTER — Ambulatory Visit: Payer: Medicare Other

## 2014-12-20 ENCOUNTER — Encounter: Payer: Self-pay | Admitting: Radiation Oncology

## 2014-12-20 ENCOUNTER — Ambulatory Visit
Admission: RE | Admit: 2014-12-20 | Discharge: 2014-12-20 | Disposition: A | Discharge status: Home / Self Care | Payer: Medicare Other | Source: Ambulatory Visit | Attending: Radiation Oncology | Admitting: Radiation Oncology

## 2014-12-20 DIAGNOSIS — Z510 Encounter for antineoplastic radiation therapy: Secondary | ICD-10-CM | POA: Diagnosis not present

## 2014-12-21 ENCOUNTER — Ambulatory Visit: Admission: RE | Admit: 2014-12-21 | Payer: Medicare Other | Source: Ambulatory Visit | Admitting: Radiation Oncology

## 2014-12-21 ENCOUNTER — Ambulatory Visit (INDEPENDENT_AMBULATORY_CARE_PROVIDER_SITE_OTHER): Payer: Medicare Other | Admitting: Family

## 2014-12-21 ENCOUNTER — Encounter: Payer: Self-pay | Admitting: Family

## 2014-12-21 ENCOUNTER — Ambulatory Visit: Payer: Medicare Other

## 2014-12-21 VITALS — BP 112/74 | HR 84 | Temp 98.2°F | Resp 18 | Ht 69.0 in | Wt 152.8 lb

## 2014-12-21 DIAGNOSIS — N390 Urinary tract infection, site not specified: Secondary | ICD-10-CM

## 2014-12-21 DIAGNOSIS — Z86711 Personal history of pulmonary embolism: Secondary | ICD-10-CM

## 2014-12-21 DIAGNOSIS — Z09 Encounter for follow-up examination after completed treatment for conditions other than malignant neoplasm: Secondary | ICD-10-CM | POA: Diagnosis not present

## 2014-12-21 DIAGNOSIS — T83511D Infection and inflammatory reaction due to indwelling urethral catheter, subsequent encounter: Secondary | ICD-10-CM

## 2014-12-21 DIAGNOSIS — C252 Malignant neoplasm of tail of pancreas: Secondary | ICD-10-CM | POA: Diagnosis not present

## 2014-12-21 MED ORDER — FENTANYL 12 MCG/HR TD PT72: 12.5000 ug | MEDICATED_PATCH | TRANSDERMAL | Status: DC

## 2014-12-21 MED ORDER — PROPRANOLOL HCL ER 120 MG PO CP24: ORAL_CAPSULE | ORAL | Status: DC

## 2014-12-21 MED ORDER — PROPRANOLOL HCL ER 120 MG PO CP24
ORAL_CAPSULE | ORAL | Status: AC
Start: 2014-12-21 — End: ?

## 2014-12-21 NOTE — Progress Notes (Signed)
Subjective:    Patient ID: [name], female    DOB: 1945/05/30, 69 y.o.   MRN: [id number]  Chief Complaint  Patient presents with  . Hospitalization Follow-up    HPI:  [name] is a 69 y.o. female who  has no past medical history on file. and presents today for a follow up office visit.   Recently seen and evaluated in the emergency room for concerns about hematuria. She is also noted to have progressive weakness and abdominal pain with nausea and vomiting. She had decreased appetite at the time. Suprapubic catheter had been changed approximately 2 weeks prior to visit. Urinalysis was positive with large leukocytes positive nitrites and white blood cells. Urine culture grew multiple species with none predominant. She was treated with Tressie Ellis on admission and Cipro floxacillin upon discharge. She was continued on Lovenox for anticoagulation secondary to pulmonary embolism and deep vein thrombosis. She remains on palliative radiation and potential for chemotherapy for pancreatic cancer. Hypertension remained stable. All ED and hospital records reviewed in detail.  Since leaving the hospital her urine continues to remain slightly cloudy with little sediment but no odor. She has completed the medications as prescribed without complication. Most recently completed her last radiation treatment yesterday. Currently awaiting follow-up with oncology to determine current status and plan. Believes current plan is to work on increasing strength and nutrition in order to attempt chemotherapy treatments. She continues to experience associated symptom of abdominal pain which is currently modified with fentanyl patches and oxycodone. Indicates the pain medication does not completely resolve the pain, however does "mellow" the pain out.   Allergies  Allergen Reactions  . Sulfa Antibiotics Swelling     Current Outpatient Prescriptions on File Prior to Visit  Medication Sig Dispense Refill  .  atorvastatin (LIPITOR) 40 MG tablet Take 1 tablet (40 mg total) by mouth daily. 90 tablet 0  . atropine 1 % ophthalmic solution Place 1 drop into the left eye 2 (two) times daily.    . cromolyn (OPTICROM) 4 % ophthalmic solution Place 1 drop into both eyes 4 (four) times daily.     Marland Kitchen diltiazem (CARTIA XT) 120 MG 24 hr capsule Take 1 capsule (120 mg total) by mouth daily. 90 capsule 0  . DORZOLAMIDE HCL-TIMOLOL MAL OP Place 1 drop into the left eye 2 (two) times daily.     Marland Kitchen dronabinol (MARINOL) 2.5 MG capsule Take 1-2 capsules (2.5-5 mg total) by mouth 2 (two) times daily before a meal. 60 capsule 1  . enoxaparin (LOVENOX) 100 MG/ML injection Inject 1 mL (100 mg total) into the skin daily. 30 Syringe 3  . latanoprost (XALATAN) 0.005 % ophthalmic solution Place 1 drop into the right eye at bedtime.     . mirtazapine (REMERON) 15 MG tablet Take 1 tablet (15 mg total) by mouth at bedtime. 90 tablet 1  . Multiple Vitamin (MULTIVITAMIN WITH MINERALS) TABS tablet Take 2 tablets by mouth daily.    . ondansetron (ZOFRAN-ODT) 4 MG disintegrating tablet DISSOLVE 1 TABLET(4 MG) ON THE TONGUE EVERY 8 HOURS AS NEEDED FOR NAUSEA OR VOMITING 20 tablet 0  . oxyCODONE 30 MG 12 hr tablet Take 30 mg by mouth every 12 (twelve) hours. 60 each 0  . Oxycodone HCl 10 MG TABS Take 1 tablet (10 mg total) by mouth every 4 (four) hours as needed. 90 tablet 0  . pantoprazole (PROTONIX) 40 MG tablet Take 1 tablet (40 mg total) by mouth 2 (two) times  daily before a meal. 90 tablet 3  . prednisoLONE acetate (PRED FORTE) 1 % ophthalmic suspension Place 1 drop into the left eye 4 (four) times daily.     . primidone (MYSOLINE) 50 MG tablet TAKE 1 TABLET BY MOUTH EVERY NIGHT AT BEDTIME 90 tablet 0  . prochlorperazine (COMPAZINE) 10 MG tablet Take 1 tablet (10 mg total) by mouth every 6 (six) hours as needed. 30 tablet 3   No current facility-administered medications on file prior to visit.     Past Surgical History  Procedure  Laterality Date  . Cholecystectomy    . Abdominal hysterectomy    . Knee surgery Bilateral     scope  . [place] filter    . Back surgery    . Eus N/A 02/26/2014    Procedure: UPPER ENDOSCOPIC ULTRASOUND (EUS) LINEAR;  Surgeon: Beryle Beams, MD;  Location: [facility];  Service: Endoscopy;  Laterality: N/A;  . Fine needle aspiration N/A 02/26/2014    Procedure: FINE NEEDLE ASPIRATION (FNA) LINEAR;  Surgeon: Beryle Beams, MD;  Location: [facility];  Service: Endoscopy;  Laterality: N/A;  . Breast lumpectomy Right   . Spine surgery    . Xi robotic assisted laparoscopic distal pancreatectomy N/A 04/08/2014    Procedure: XI ROBOTIC ASSISTED CONVERTED LAPAROSCOPIC HAND ASSITED DISTAL PANCREATECTOMY/SPLENECTOMY WITH PARTIAL COLECTOMY;  Surgeon: [name], MD;  Location: [facility];  Service: General;  Laterality: N/A;  . Colon surgery    . Diagnostic laparoscopy    . Cataract extraction      left eye  . Eus N/A 11/05/2014    Procedure: FULL UPPER ENDOSCOPIC ULTRASOUND (EUS) RADIAL;  Surgeon: Carol Ada, MD;  Location: [facility];  Service: Endoscopy;  Laterality: N/A;  . Fine needle aspiration N/A 11/05/2014    Procedure: FINE NEEDLE ASPIRATION (FNA) LINEAR;  Surgeon: Carol Ada, MD;  Location: [facility];  Service: Endoscopy;  Laterality: N/A;    Review of Systems  Constitutional: Negative for fever and chills.  Gastrointestinal: Positive for abdominal pain.  Genitourinary: Negative for urgency, frequency and hematuria.      Objective:    BP 112/74 mmHg  Pulse 84  Temp(Src) 98.2 F (36.8 C) (Oral)  Resp 18  Ht 5\' 9"  (1.753 m)  Wt 152 lb 12.8 oz (69.31 kg)  BMI 22.55 kg/m2  SpO2 95% Nursing note and vital signs reviewed.  Physical Exam  Constitutional: She is oriented to person, place, and time. She appears well-developed and well-nourished. No distress.  Cardiovascular: Normal rate, regular rhythm, normal heart sounds and intact distal pulses.   Pulmonary/Chest:  Effort normal and breath sounds normal.  Neurological: She is alert and oriented to person, place, and time.  Skin: Skin is warm and dry.  Psychiatric: She has a normal mood and affect. Her behavior is normal. Judgment and thought content normal.       Assessment & Plan:   Problem List Items Addressed This Visit      Genitourinary   UTI (urinary tract infection) due to urinary indwelling catheter ([place]) - Primary    Symptoms of urinary tract infection appear resolved with antibiotic therapy and no evidence of odor or hematuria remaining. Suprapubic catheter replaced per hospital records. Continue to follow up with urology for management of suprapubic catheter. Follow-up in primary care for evidence of urinary tract infection of urology unavailable.        Other   Hx pulmonary embolism    Stable and continue current dosage of Lovenox.  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 Had significant discussion with patient regarding goals of care and her overall treatment goals. She wishes to continue with the current treatment regimen to increase her strength and possibly pursue chemotherapy. Her goal is to see a grandchild graduate from college in the near future. Pain management continues to be a challenge. Continue current dosage of fentanyl patch and oxycodone. Changes per oncology at this time. We will refill fentanyl patch to get her to her next oncology appointment. Follow-up in primary care as needed.      Relevant Medications   fentaNYL (DURAGESIC - DOSED MCG/HR) 12 MCG/HR

## 2014-12-21 NOTE — Patient Instructions (Signed)
Thank you for choosing [facility].  Summary/Instructions:  If your symptoms worsen or fail to improve, please contact our office for further instruction, or in case of emergency go directly to the emergency room at the closest medical facility.   Please continue to take your medication as prescribed.  The goal of pain medication is to ease the pain, but it will not get rid of it completely. Do not take more pain medicine than needed.   Do NOT drive while taking narcotic pain medications.   Do NOT drink alcohol while taking prescription pain medication.   Please use over-the-counter medications for constipation, such as miralax, senakot, colace, or magnesium citrate, while you are taking narcotic pain medication.  Pay attention to the side effects of any pain medication  Do NOT exceed 3000 mg of acetaminophen (Tylenol) TOTAL in a 24 hour period. (Please be aware that you may have acetaminophen in other medications that you are taking.)

## 2014-12-21 NOTE — Assessment & Plan Note (Signed)
Symptoms of urinary tract infection appear resolved with antibiotic therapy and no evidence of odor or hematuria remaining. Suprapubic catheter replaced per hospital records. Continue to follow up with urology for management of suprapubic catheter. Follow-up in primary care for evidence of urinary tract infection of urology unavailable.

## 2014-12-21 NOTE — Addendum Note (Signed)
Addended by: Mauricio Po D on: 12/21/2014 10:00 PM   Modules accepted: Level of Service

## 2014-12-21 NOTE — Progress Notes (Signed)
Pre visit review using our clinic review tool, if applicable. No additional management support is needed unless otherwise documented below in the visit note. 

## 2014-12-21 NOTE — Assessment & Plan Note (Signed)
Recently completed radiation therapy and currently awaiting follow-up with oncology. Had significant discussion with patient regarding goals of care and her overall treatment goals. She wishes to continue with the current treatment regimen to increase her strength and possibly pursue chemotherapy. Her goal is to see a grandchild graduate from college in the near future. Pain management continues to be a challenge. Continue current dosage of fentanyl patch and oxycodone. Changes per oncology at this time. We will refill fentanyl patch to get her to her next oncology appointment. Follow-up in primary care as needed.

## 2014-12-21 NOTE — Assessment & Plan Note (Signed)
Stable and continue current dosage of Lovenox.

## 2014-12-22 ENCOUNTER — Telehealth: Payer: Self-pay | Admitting: *Deleted

## 2014-12-22 NOTE — Telephone Encounter (Signed)
Received message from daughter [name] re:  Pt has not heard from home health services yet.   Spoke with [name] @ [facility].  [name] did not know why the referral has not been worked on yet.  [name] stated she would contact daughter today about the referral.  Spoke with daughter and informed her of same info. [name]'s  Phone    [phone].

## 2014-12-28 ENCOUNTER — Telehealth: Payer: Self-pay | Admitting: *Deleted

## 2014-12-28 ENCOUNTER — Other Ambulatory Visit: Payer: Self-pay | Admitting: *Deleted

## 2014-12-28 ENCOUNTER — Other Ambulatory Visit: Payer: Self-pay | Admitting: Hematology

## 2014-12-28 ENCOUNTER — Telehealth: Payer: Self-pay

## 2014-12-28 DIAGNOSIS — C259 Malignant neoplasm of pancreas, unspecified: Secondary | ICD-10-CM

## 2014-12-28 DIAGNOSIS — C252 Malignant neoplasm of tail of pancreas: Secondary | ICD-10-CM

## 2014-12-28 MED ORDER — PROMETHAZINE HCL 25 MG RE SUPP: 12.5000 mg | Freq: Four times a day (QID) | RECTAL | Status: AC | PRN

## 2014-12-28 MED ORDER — PROCHLORPERAZINE EDISYLATE 5 MG/ML IJ SOLN: 10.0000 mg | INTRAMUSCULAR | Status: DC

## 2014-12-28 NOTE — Telephone Encounter (Signed)
Spoke with daugter Mardene Celeste.  Informed her re:  Dr. Burr Medico has increased home IVF to 3 times per week with IV Compazine to be given IVP.  Instructed [facility] to pick up Phenergan suppository for pt's nausea/vomiting.   Per daughter, pt is asking for PICC catheter insertion for easy IV access.    Spoke with Santiago Glad, [facility] Liaison.  New referral made in EPIC.  Spoke with Tiffany in interventional radiology.  Order placed for PICC.  Tiffany spoke with daughter, and informed nurse of PICC placement scheduled for 12/29/14.

## 2014-12-28 NOTE — Telephone Encounter (Signed)
[name] with [facility] called. [facility] needs referral in epic and it needs to specify about the rate, with minimum of 4 hours. And how do you want the compazine given, in the fluids or as ivp. [name] [phone] for clarification.

## 2014-12-28 NOTE — Telephone Encounter (Signed)
Received call from daughter [name] requesting antiemetic and pain meds by IV when pt receives IVF from home health.  Spoke with daughter, and was informed that pt had vomited all day yesterday despite Zofran ODT.   Pt was able to keep 1 pain pill down with sips of water and ginger ale. Pt currently has IVF weekly.  [name] would like to know if Dr. Burr Medico would consider : 1.    Can pt have PICC line or portacath for easy access ? 2.    Can pt have  IV antiemetics and pain meds when pt has IVF running ?  [name]'s   Phone    [phone].

## 2014-12-28 NOTE — Telephone Encounter (Signed)
Voicemail: "Santiago Glad with [facility].  We cannot provide IV compazine for this patient.Please eRx order to [facility] which is close to the patient.  Send today so she will have it when PICC inserted for start of care tomorrow.

## 2014-12-29 ENCOUNTER — Ambulatory Visit (HOSPITAL_COMMUNITY)
Admission: RE | Admit: 2014-12-29 | Discharge: 2014-12-29 | Disposition: A | Discharge status: Home / Self Care | Payer: Medicare Other | Source: Ambulatory Visit | Attending: Hematology | Admitting: Hematology

## 2014-12-29 ENCOUNTER — Other Ambulatory Visit: Payer: Self-pay | Admitting: Hematology

## 2014-12-29 DIAGNOSIS — C259 Malignant neoplasm of pancreas, unspecified: Secondary | ICD-10-CM | POA: Insufficient documentation

## 2014-12-29 DIAGNOSIS — C252 Malignant neoplasm of tail of pancreas: Secondary | ICD-10-CM

## 2014-12-29 MED ORDER — LIDOCAINE HCL 1 % IJ SOLN
INTRAMUSCULAR | Status: AC
  Filled 2014-12-29: qty 20

## 2014-12-29 MED ORDER — HEPARIN SOD (PORK) LOCK FLUSH 100 UNIT/ML IV SOLN
500.0000 [IU] | Freq: Once | INTRAVENOUS | Status: AC
  Administered 2014-12-29: 500 [IU]

## 2014-12-29 MED ORDER — HEPARIN SOD (PORK) LOCK FLUSH 100 UNIT/ML IV SOLN
INTRAVENOUS | Status: AC
  Administered 2014-12-29: 500 [IU]
  Filled 2014-12-29: qty 5

## 2014-12-29 NOTE — Procedures (Signed)
Successful placement of dual lumen PICC line to right brachial vein. Length 34cm Tip at lower SVC/RA No complications Ready for use.  Tsosie Billing D PA-C 12/29/14 14:44

## 2015-01-07 ENCOUNTER — Other Ambulatory Visit: Payer: Self-pay | Admitting: *Deleted

## 2015-01-07 ENCOUNTER — Telehealth: Payer: Self-pay | Admitting: Hematology

## 2015-01-07 ENCOUNTER — Other Ambulatory Visit: Payer: Self-pay | Admitting: Hematology

## 2015-01-07 ENCOUNTER — Telehealth: Payer: Self-pay | Admitting: *Deleted

## 2015-01-07 ENCOUNTER — Ambulatory Visit (HOSPITAL_BASED_OUTPATIENT_CLINIC_OR_DEPARTMENT_OTHER): Payer: Medicare Other | Admitting: Hematology

## 2015-01-07 ENCOUNTER — Encounter: Payer: Self-pay | Admitting: Hematology

## 2015-01-07 ENCOUNTER — Other Ambulatory Visit (HOSPITAL_BASED_OUTPATIENT_CLINIC_OR_DEPARTMENT_OTHER): Payer: Medicare Other

## 2015-01-07 VITALS — BP 134/77 | HR 79 | Temp 97.9°F | Resp 18 | Ht 69.0 in | Wt 146.2 lb

## 2015-01-07 DIAGNOSIS — C252 Malignant neoplasm of tail of pancreas: Secondary | ICD-10-CM | POA: Diagnosis present

## 2015-01-07 DIAGNOSIS — E876 Hypokalemia: Secondary | ICD-10-CM

## 2015-01-07 DIAGNOSIS — I82503 Chronic embolism and thrombosis of unspecified deep veins of lower extremity, bilateral: Secondary | ICD-10-CM

## 2015-01-07 DIAGNOSIS — E871 Hypo-osmolality and hyponatremia: Secondary | ICD-10-CM

## 2015-01-07 DIAGNOSIS — R110 Nausea: Secondary | ICD-10-CM

## 2015-01-07 DIAGNOSIS — G893 Neoplasm related pain (acute) (chronic): Secondary | ICD-10-CM | POA: Diagnosis not present

## 2015-01-07 DIAGNOSIS — Z86718 Personal history of other venous thrombosis and embolism: Secondary | ICD-10-CM

## 2015-01-07 LAB — CBC WITH DIFFERENTIAL/PLATELET
BASO%: 0.3 % (ref 0.0–2.0)
Basophils Absolute: 0 10*3/uL (ref 0.0–0.1)
EOS ABS: 0.3 10*3/uL (ref 0.0–0.5)
EOS%: 4.3 % (ref 0.0–7.0)
HCT: 41.5 % (ref 34.8–46.6)
HEMOGLOBIN: 13.6 g/dL (ref 11.6–15.9)
LYMPH%: 12.1 % — ABNORMAL LOW (ref 14.0–49.7)
MCH: 31.6 pg (ref 25.1–34.0)
MCHC: 32.8 g/dL (ref 31.5–36.0)
MCV: 96.3 fL (ref 79.5–101.0)
MONO#: 0.6 10*3/uL (ref 0.1–0.9)
MONO%: 9.3 % (ref 0.0–14.0)
NEUT%: 74 % (ref 38.4–76.8)
NEUTROS ABS: 4.5 10*3/uL (ref 1.5–6.5)
PLATELETS: 249 10*3/uL (ref 145–400)
RBC: 4.31 10*6/uL (ref 3.70–5.45)
RDW: 17 % — AB (ref 11.2–14.5)
WBC: 6.1 10*3/uL (ref 3.9–10.3)
lymph#: 0.7 10*3/uL — ABNORMAL LOW (ref 0.9–3.3)

## 2015-01-07 LAB — COMPREHENSIVE METABOLIC PANEL
ALBUMIN: 2.5 g/dL — AB (ref 3.5–5.0)
ALK PHOS: 93 U/L (ref 40–150)
ALT: 9 U/L (ref 0–55)
ANION GAP: 13 meq/L — AB (ref 3–11)
AST: 39 U/L — ABNORMAL HIGH (ref 5–34)
BILIRUBIN TOTAL: 0.62 mg/dL (ref 0.20–1.20)
BUN: <4 mg/dL — ABNORMAL LOW (ref 7.0–26.0)
CO2: 25 meq/L (ref 22–29)
CREATININE: 0.7 mg/dL (ref 0.6–1.1)
Calcium: 8.9 mg/dL (ref 8.4–10.4)
Chloride: 103 mEq/L (ref 98–109)
GLUCOSE: 122 mg/dL (ref 70–140)
Potassium: 2.9 mEq/L — CL (ref 3.5–5.1)
Sodium: 141 mEq/L (ref 136–145)
TOTAL PROTEIN: 6.9 g/dL (ref 6.4–8.3)

## 2015-01-07 MED ORDER — POTASSIUM CHLORIDE 20 MEQ PO PACK: 20.0000 meq | PACK | ORAL | Status: AC

## 2015-01-07 MED ORDER — OXYCODONE HCL 10 MG PO TABS: 10.0000 mg | ORAL_TABLET | ORAL | Status: DC | PRN

## 2015-01-07 NOTE — Telephone Encounter (Signed)
Gave and printed appts ched and avs for pt for DEC  °

## 2015-01-07 NOTE — Telephone Encounter (Signed)
Referral made today for [facility] to add KCl 20 meq to each liter of fluids for home infusion.  Was informed by Edwinna Areola, RN @ [facility] that insurance will not cover additive to IVF.  Santiago Glad discussed with daughter;  Daughter stated she could not afford out of pocket payment.  Dr. Burr Medico notified.  Script called in to pt's pharmacy for K-dur 20 meq powder.  Informed daughter Rhodes of same info.  Daughter voiced understanding.

## 2015-01-07 NOTE — Progress Notes (Signed)
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 CT chest was negative.    02/26/2014 Pathology Results FNA of pancreatic mass showed adenocarcinoma    03/22/2014 Initial Diagnosis Pancreatic adenocarcinoma   04/08/2014 Surgery robotic converted to hand-assisted laparoscopic distal pancreatectomy, splenectomy, partial colectomy, and partial removal of the left renal capsule for pancreatic adenocarcinoma. Surgical margins were negative. pT3N0   04/08/2014 - 05/04/2014 Hospital Admission admitted for pancreatic surgery, complicated with abdominal abcess, which was drained and treated with abx for pseudomonas infection, difficulty with eating and ambulatory, malnutrition, was discharged to rehab     04/08/2014 Pathology Results pT3N0, moderately differentiated invasive adenocarcinoma, spanning 4.5 cm, invading colonic submucosa. Lymphovascular invasion is identified, perineural invasion is identified. 10 lymph nodes were negative. Surgical margins were negative.   05/09/2014 - 05/21/2014 Hospital Admission Pt was admited for chest and abdominal pain, found to have left side pleural effusion. thoracentesis  done by IR with 592ml fluids removed, cytology (-) malignant cells   10/02/2014 Imaging CT CAP showed a two new soft tissue versus cystic structures in the peripancreatic region, decresased left pleural effusion    11/05/2014 Pathology Results EGD/EUS needle biopsy of the soft tissue in the peripancreatic region showed adenocarcinoma    12/06/2014 - 12/20/2014 Radiation Therapy palliative radiaiton to peripancreatic lesion     HISTORY OF PRESENTING ILLNESS:  [name] 69 y.o. female is here because of her recurrent DVT and incidental discovered pancreatic mass.  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 She had IVC filter placed back then and put on coumadin. She had second episode of right leg DVT when she was on coumadin in 2009, and continued coumadin until one year ago when she had trouble for monitoring her INR. She was then switched to Aspirin 81mg  dialy. 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 CT abdomen was ordered to further evaluate the DVT and abdominal pain on 12/22/2013, which showed thrombus in IVC and right common iliac vein below IVC. CT scan also showed a 2.8 x 2.5 cm mass within the tail of pancreas, this was confirmed by the MRI abdomen, with probably encasement of the splenic vessels. No significant abdominal adenopathy. CT of the chest was negative for distant metastasis. She was referred to see GI Dr.Hung and will have EGD/EUS for pancreatic mass biopsy in a week.   She has been having abdominal pain after eating in the pat few month, along with diarrhea  and constipation, which has been chronic since her cholecystectomy in 199. Her appetite is fare, and weight is stable. She also has tremor since teenager, and got worse lately,  along with gait instaedyness, was seen by neurologist Dr. Carles Collet. She was put on primidone which has help quite bit.   She had both knee surgery and was using a crane until recently when she was diagnosed with DVT and she has been using a walker. She has been having some gait instability lately, she was referred by Dr. Carles Collet and MRI of brain showed mild chronic small vessel ischemia disease and cerebral atrophy.  CURRENT THERAPY: supportive care  INTERIM HISTORY [name] returns for follow-up with her daughter.  Her main concern is still upper abdominal pain,  Which she rates at 8-9 out of 10. She is on fentanyl patch 12.5,  And also takes  OxyContin 30 mg twice a day.  She takes oxycodone 10 mg almost every 4 hours. She does not sleep well at night due to pain. She has intermittent nausea,  Which usually gets better when her pain is better controlled. She eats little, she has a PICC line placed, and is on IVF three times a week. No fever or chills.   MEDICAL HISTORY:  History reviewed. No pertinent past medical history.  SURGICAL HISTORY: Past Surgical History  Procedure Laterality Date  . Cholecystectomy    . Abdominal hysterectomy    . Knee surgery Bilateral     scope  . [place] filter    . Back surgery    . Eus N/A 02/26/2014    Procedure: UPPER ENDOSCOPIC ULTRASOUND (EUS) LINEAR;  Surgeon: Beryle Beams, MD;  Location: [facility];  Service: Endoscopy;  Laterality: N/A;  . Fine needle aspiration N/A 02/26/2014    Procedure: FINE NEEDLE ASPIRATION (FNA) LINEAR;  Surgeon: Beryle Beams, MD;  Location: [facility];  Service: Endoscopy;  Laterality: N/A;  . Breast lumpectomy Right   . Spine surgery    . Xi robotic assisted laparoscopic distal pancreatectomy N/A 04/08/2014    Procedure: XI ROBOTIC ASSISTED CONVERTED LAPAROSCOPIC HAND ASSITED DISTAL PANCREATECTOMY/SPLENECTOMY WITH PARTIAL COLECTOMY;  Surgeon: [name], MD;  Location: [facility];  Service: General;  Laterality: N/A;  .  Colon surgery    . Diagnostic laparoscopy    . Cataract extraction      left eye  . Eus N/A 11/05/2014    Procedure: FULL UPPER ENDOSCOPIC ULTRASOUND (EUS) RADIAL;  Surgeon: Carol Ada, MD;  Location: [facility];  Service: Endoscopy;  Laterality: N/A;  . Fine needle aspiration N/A 11/05/2014    Procedure: FINE NEEDLE ASPIRATION (FNA) LINEAR;  Surgeon: Carol Ada, MD;  Location: [facility];  Service: Endoscopy;  Laterality: N/A;    SOCIAL HISTORY: History   Social History  . Marital Status: Divorced    Spouse Name: N/A    Number of Children: 3  . Years of Education: N/A   Occupational History   She used to work for [employer], retired now    Social History Main Topics  . Smoking status: Current Every Day Smoker -- 0.50 packs/day, for 40 years     Types: Cigarettes  . Smokeless tobacco: Not on file  . Alcohol Use: 0.0 oz/week, social drinker     0 Not specified per week     Comment: occ- glass of wine a week   . Drug Use: No  . Sexual Activity: Not on file   Other Topics Concern  . Not on  file   Social History Narrative   Divorced, 3 children   Right handed   Associates degree   3 per week    FAMILY HISTORY: Family History  Problem Relation Age of Onset  . Diabetes Father 80  . Diabetes Maternal Grandmother   . Hypertension Mother   . Glaucoma Sister   . Cataracts Maternal Grandmother   . Cancer Sister 78    Breast    ALLERGIES:  is allergic to sulfa antibiotics.  MEDICATIONS:  Current Outpatient Prescriptions  Medication Sig Dispense Refill  . atorvastatin (LIPITOR) 40 MG tablet Take 1 tablet (40 mg total) by mouth daily. 90 tablet 0  . atropine 1 % ophthalmic solution Place 1 drop into the left eye 2 (two) times daily.    . cromolyn (OPTICROM) 4 % ophthalmic solution Place 1 drop into both eyes 4 (four) times daily.     Marland Kitchen diltiazem (CARTIA XT) 120 MG 24 hr capsule Take 1 capsule (120 mg total) by mouth daily. 90 capsule 0  . DORZOLAMIDE  HCL-TIMOLOL MAL OP Place 1 drop into the left eye 2 (two) times daily.     Marland Kitchen dronabinol (MARINOL) 2.5 MG capsule Take 1-2 capsules (2.5-5 mg total) by mouth 2 (two) times daily before a meal. 60 capsule 1  . enoxaparin (LOVENOX) 100 MG/ML injection Inject 1 mL (100 mg total) into the skin daily. 30 Syringe 3  . fentaNYL (DURAGESIC - DOSED MCG/HR) 12 MCG/HR Place 1 patch (12.5 mcg total) onto the skin every 3 (three) days. 10 patch 0  . latanoprost (XALATAN) 0.005 % ophthalmic solution Place 1 drop into the right eye at bedtime.     . mirtazapine (REMERON) 15 MG tablet Take 1 tablet (15 mg total) by mouth at bedtime. 90 tablet 1  . Multiple Vitamin (MULTIVITAMIN WITH MINERALS) TABS tablet Take 2 tablets by mouth daily.    . ondansetron (ZOFRAN-ODT) 4 MG disintegrating tablet DISSOLVE 1 TABLET(4 MG) ON THE TONGUE EVERY 8 HOURS AS NEEDED FOR NAUSEA OR VOMITING 20 tablet 0  . oxyCODONE 30 MG 12 hr tablet Take 30 mg by mouth every 12 (twelve) hours. 60 each 0  . Oxycodone HCl 10 MG TABS Take 1 tablet (10 mg total) by mouth every 4 (four) hours as needed. 90 tablet 0  . pantoprazole (PROTONIX) 40 MG tablet Take 1 tablet (40 mg total) by mouth 2 (two) times daily before a meal. 90 tablet 3  . prednisoLONE acetate (PRED FORTE) 1 % ophthalmic suspension Place 1 drop into the left eye 4 (four) times daily.     . primidone (MYSOLINE) 50 MG tablet TAKE 1 TABLET BY MOUTH EVERY NIGHT AT BEDTIME 90 tablet 0  . prochlorperazine (COMPAZINE) 10 MG tablet Take 1 tablet (10 mg total) by mouth every 6 (six) hours as needed. 30 tablet 3  . prochlorperazine (COMPAZINE) 5 MG/ML injection Inject 2 mLs (10 mg total) into the vein as directed. 10 mL 0  . promethazine (PHENERGAN) 25 MG suppository Place 0.5-1 suppositories (12.5-25 mg total) rectally every 6 (six) hours as needed for nausea or vomiting. 12 each 0  . propranolol ER (INDERAL LA) 120 MG 24 hr capsule TAKE 1 CAPSULE(120 MG) BY MOUTH DAILY 90 capsule 3  . potassium  chloride (KLOR-CON) 20 MEQ packet Take 20 mEq by mouth as directed. Take 40 meq daily by mouth   For  7  Days ;  Then 20 meq daily by mouth. 30 packet 1   No current  facility-administered medications for this visit.    REVIEW OF SYSTEMS:   Constitutional: Denies fevers, chills or abnormal night sweats, (+) fatigue  Eyes: Denies blurriness of vision, double vision or watery eyes Ears, nose, mouth, throat, and face: Denies mucositis or sore throat Respiratory: Denies cough, dyspnea or wheezes Cardiovascular: Denies palpitation, chest discomfort or lower extremity swelling Gastrointestinal:  (+) nausea and vomiting, no heartburn or change in bowel habits. (+) worsening abdominal pain  Skin: Denies abnormal skin rashes Lymphatics: Denies new lymphadenopathy or easy bruising Neurological:Denies numbness, tingling or new weaknesses Behavioral/Psych: Mood is stable, no new changes  Extremities: Bilateral lower extremity edema and leg pain, left more than right. All other systems were reviewed with the patient and are negative.  PHYSICAL EXAMINATION: ECOG PERFORMANCE STATUS: 3 - Symptomatic, >50% confined to bed  Filed Vitals:   01/07/15 1245  BP: 134/77  Pulse: 79  Temp: 97.9 F (36.6 C)  Resp: 18   Filed Weights   01/07/15 1245  Weight: 146 lb 3.2 oz (66.316 kg)    GENERAL:alert, no distress and comfortable, sitting in wheelchair.  SKIN: skin color, texture, turgor are normal, no rashes. She has some bruises on her abdomen at the Lovenox injection site. She also has a 2 cm subcutaneous nodule on the right flank area, likely a hematoma. EYES: normal, conjunctiva are pink and non-injected, sclera clear OROPHARYNX:no exudate, no erythema and lips, buccal mucosa, and tongue normal  NECK: supple, thyroid normal size, non-tender, without nodularity LYMPH:  no palpable lymphadenopathy in the cervical, axillary or inguinal LUNGS: clear to auscultation and percussion with normal breathing  effort HEART: regular rate & rhythm and no murmurs and no lower extremity edema ABDOMEN:abdomen soft, mild tenderness at mid and low abdomen, normal bowel sounds Musculoskeletal:no cyanosis of digits and no clubbing  PSYCH: alert & oriented x 3 with fluent speech NEURO: no focal motor/sensory deficits Ext: (+) Bilateral Ankle swelling  LABORATORY DATA:  I have reviewed the data as listed CBC Latest Ref Rng 01/07/2015 12/17/2014 12/11/2014  WBC 3.9 - 10.3 10e3/uL 6.1 5.2 5.7  Hemoglobin 11.6 - 15.9 g/dL 13.6 11.6 11.8(L)  Hematocrit 34.8 - 46.6 % 41.5 34.9 35.9(L)  Platelets 145 - 400 10e3/uL 249 199 222     CMP Latest Ref Rng 01/07/2015 12/17/2014 12/12/2014  Glucose 70 - 140 mg/dl 122 114 119(H)  BUN 7.0 - 26.0 mg/dL <4.0(L) 5.5(L) 6  Creatinine 0.6 - 1.1 mg/dL 0.7 0.6 0.53  Sodium 136 - 145 mEq/L 141 142 141  Potassium 3.5 - 5.1 mEq/L 2.9(LL) 3.2(L) 3.3(L)  Chloride 101 - 111 mmol/L - - 108  CO2 22 - 29 mEq/L 25 27 25   Calcium 8.4 - 10.4 mg/dL 8.9 8.7 8.7(L)  Total Protein 6.4 - 8.3 g/dL 6.9 5.5(L) -  Total Bilirubin 0.20 - 1.20 mg/dL 0.62 0.33 -  Alkaline Phos 40 - 150 U/L 93 53 -  AST 5 - 34 U/L 39(H) 69(H) -  ALT 0 - 55 U/L 9 16 -     PATHOLOGY REPORT 04/08/2014 REASON FOR ADDENDUM, AMENDMENT OR CORRECTION: [id number]: AMENDMENT: Amendment issued to report additional gross information in part #2. ds 04/14/14 04:55:45 PM FINAL DIAGNOSIS Diagnosis 04/08/2014 1. Colon, segmental resection, transverse - PANCREATIC ADENOCARCINOMA INVADING COLONIC SUBMUCOSA. - LYMPHOVASCULAR INVASION IS IDENTIFIED. - THE COLONIC RESECTION MARGINS ARE NEGATIVE FOR ADENOCARCINOMA. 2. Pancreas, resection - INVASIVE ADENOCARCINOMA, MODERATELY DIFFERENTIATED, SPANNING 4.5 CM. - LYMPHOVASCULAR INVASION IS IDENTIFIED. - PERINEURAL INVASION IS IDENTIFIED. - ADENOCARCINOMA IS BROADLY PRESENT AT  T 10 mg 1 negative. Surgical margins were negative. HE CLINICAL COLONIC MARGIN OF SPECIMEN #2. -  ADENOCARCINOMA IS ADHERENT TO THE RENAL CAPSULE, BUT DOES NOT INVOLVE RENAL PARENCHYMA. - ADENOCARCINOMA IS ADHERENT TO THE SPLENIC CAPSULE, BUT DOES NOT INVOLVE SPLENIC PARENCHYMA. - THE PANCREATIC PARENCHYMAL RESECTION MARGIN IS NEGATIVE FOR ADENOCARCINOMA. - THERE IS NO EVIDENCE OF CARCINOMA IN 10 OF 10 LYMPH NODES (0/10). - SEE ONCOLOGY TABLE BELOW. Microscopic Comment 2. PANCREAS (EXOCRINE): Procedure: Distal pancreatectomy with adherent renal capsule and portion of spleen and transverse colon resection. Tumor Site: Distal pancreatic tail. Tumor Size: 4.5 cm (gross measurement). Histologic Type: Adenocarcinoma. Histologic Grade (ductal carcinoma only): G2: Moderately differentiated Microscopic Tumor Extension: Adenocarcinoma involves colonic submucosa, renal capsule, and splenic capsule. Margins: The pancreatic parenchymal margin is negative for adenocarcinoma. Distance of invasive carcinoma from closest margin: 5.2 cm to the pancreatic parenchymal margin, see comment. Carcinoma in situ / high-grade dysplasia: Not identified. Treatment Effect: N/A. 1 of 3 Amended copy Amended FINAL for Mcanelly, Riata A [id number]) Microscopic Comment(continued) Lymph-Vascular Invasion: Present, diffuse Perineural Invasion: Present, diffuse Lymph nodes: number examined 10; number positive: 0. Pathologic Staging: pT3, pN0. Additional Pathologic Findings: No significant findings. Ancillary Studies: N/A. Comment(s): There is a primary 4.5 cm moderately differentiated pancreatic adenocarcinoma present in the tail of the pancreas. There is direct extension of this tumor to the submucosa of the transverse colon (specimen #1). In addition, adenocarcinoma extends to and involves the renal capsule and splenic capsule. Despite four lymph nodes being negative for parenchymal metastases, one of the four lymph nodes does show perinodal deposits of adenocarcinoma. The adenocarcinoma present in the submucosa of  the transverse colon, renal capsule, splenic capsule, and perinodal soft tissue is interpreted to represent direct extension of the primary tumor, rather than metastatic sites and thus, the tumor is staged as above.  Comment There are malignant cells with prominent nucleili and intracytoplasmic vacuoles, arranged in acinar pattern. The overall morphologic findings favor a diagnosis of well differentiated adenocarcinoma.   Diagnosis 11/05/2014 FINE NEEDLE ASPIRATION, ENDOSCOPIC PANCREATIC TAIL (SPECIMEN 1 OF 1, COLLECTED ON 11/05/14): MALIGNANT CELLS PRESENT SEE COMMENT COMMENT: THE MALIGNANT CELLS ARE CONSISTENT WITH ADENOCARCINOMA. THE CASE WAS REVIEWED BY DR Tresa Moore, WHO CONCURS.   RADIOGRAPHIC STUDIES: I have personally reviewed the radiological images as listed and agreed with the findings in the report.  Ct Abdomen Pelvis W Contrast 10/02/2014    IMPRESSION: Foley catheter within thick-walled bladder, which may indicate infection/cystitis.  Two new soft tissue versus cystic structures in the peripancreatic region since 06/15/2014. Although these may represent pseudocysts, recurrent neoplasm is not entirely excluded and close interval follow-up or further evaluation is recommended.  Decreasing left pleural effusion which now appears slightly loculated. CT chest 10/07/2014 IMPRESSION: 1. Stable small left pleural effusion with associated passive atelectasis. No new lung nodule or other significant new findings in the chest. Stable small left internal mammary node warrants observation.   ASSESSMENT & PLAN:  69 year old female, with past medical history of recurrent bilateral lower extremity DVTs, presented with acute extensive left lower extremity DVT and thrombus in the IVC and the right, iliac vein, and chronic DVT in the right popliteal vein. She was also found to have a pancreatic tail mass on the CT scan.  1. Pancreatic adenocarcinoma, pT3 N0 M0, stage IIA, local  recurrence in 09/2014  - Her restaging CT scan from 10/02/2014 showed two small soft tissue vs cystic lesion in pancrease. -I discussed her biopsy results with patient and her  daughter in details. Unfortunately she has local recurrence I'll check with her surgeon Dr. Barry Dienes, but unlikely she will offer more surgery.  -We reviewed her case in our tumor board. Unfortunately this is likely incurable disease.  -She completed  Palliative radiation,  But her pain is still not controlled. - I recommend her to increase fentanyl patch from 25 to 48mcg/hr,  And I reviewed her oxycodone.  She'll gradually wean off OxyContin - I recommend her to have a celiac nerve block interventional radiology , the benefit and risks were  Discussed with patient and her daughter. She agrees to proceed. I'll refer her to IR -Given her rapid deterioration, she is unlikely to be a candidate for systemic chemotherapy. -We discussed the goal of care with her and her daughter today, they agree with palliative approach to preserve her quality of life. - I recommend her to enroll hospice after the celiac nerve block procedure, her daughter has agreed, pt is not against it.   2. Abdominal pain due to local recurrent pancreatic cancer -her pain is getting worse lately   -increase fentanyl patch to 30mcg/hr, wean off  OxyContin (high copay), and oxycodone as needed for breakthrough pain -I also refilled her oxycodone   3. Recurrent DVT  -I reviewed her lab results of prothrombin gene mutation, factor V Leyden mutation, and antithrombin III, which were all negative.  -Giving her recurrent unprovoked DVT, I recommend anticoagulation indefinitely. -she has some pain at injection sites, we discussed other A/C options, such as coumadin and xarelto, due to the cost issue, she wants to remain on lovenox   -continue lovenox 100 mg once daily. Her renal function is normal  4. Nausea, Anorexia, weight loss  -continue home IV 3 times a  week -continue Marinol 2.5 mg twice a day, may increase on next visit, she  Is also on mirtazapine -continue Compazine and Zofran  As needed for nausea -I encouraged her to continue nutrition supplement  5.  Hyponatremia - Her potassium was 2.9 today.  Due to the cost involved with the IV potassium , patient agrees to take liquid potassium bilaterals. According 20 mEq twice daily for 7 days, then once daily  6. Code status -was discussed with patient and her daughter last time. I recommended DO NOT RESUSCITATE and DO NOT INTUBATE, she will think about it.  Plan:  -IR  Referral for celiac nerve block. I personally contacted IR physician  To try to get in a sinus quickly - increase fentanyl patch to 25, and I refilled her oxycodone - return to clinic in 10 days for follow-up - Possible hospice after celiac node block  All questions were answered. The patient knows to call the clinic with any problems, questions or concerns. I spent 25 minutes counseling the patient face to face. The total time spent in the appointment was 40 minutes and more than 50% was on counseling.     Truitt Merle, MD 12/17/2014   11:11 PM

## 2015-01-08 ENCOUNTER — Other Ambulatory Visit: Payer: Self-pay | Admitting: Family

## 2015-01-08 LAB — CANCER ANTIGEN 19-9: CA 19-9: 84.5 U/mL — ABNORMAL HIGH (ref <35.0)

## 2015-01-10 NOTE — Addendum Note (Signed)
Addended by: Truitt Merle on: 01/10/2015 12:51 PM   Modules accepted: Orders

## 2015-01-11 ENCOUNTER — Ambulatory Visit (HOSPITAL_COMMUNITY)
Admission: RE | Admit: 2015-01-11 | Discharge: 2015-01-11 | Disposition: A | Discharge status: Home / Self Care | Payer: Medicare Other | Source: Ambulatory Visit | Attending: Urology | Admitting: Urology

## 2015-01-11 DIAGNOSIS — N312 Flaccid neuropathic bladder, not elsewhere classified: Secondary | ICD-10-CM

## 2015-01-12 ENCOUNTER — Other Ambulatory Visit: Payer: Self-pay | Admitting: Radiology

## 2015-01-12 ENCOUNTER — Ambulatory Visit (HOSPITAL_COMMUNITY): Payer: Medicare Other

## 2015-01-12 ENCOUNTER — Other Ambulatory Visit: Payer: Medicare Other

## 2015-01-12 NOTE — Progress Notes (Signed)
  Radiation Oncology         [phone] ________________________________  Name: [name] MRN: [id number]  Date: 12/20/2014  DOB: 1945/07/19  End of Treatment Note  Diagnosis:   Recurrent pancreatic cancer     Indication for treatment::  palliative       Radiation treatment dates:   12/06/2014 through 12/20/2014   Site/dose:   The patient was treated to the abdomen in a palliative manner using a 3-D conformal technique on our tomotherapy unit which corresponded to 5 treatment fields. The patient was treated to a dose of 30 gray in 10 fractions.  Narrative: The patient tolerated radiation treatment relatively well.     Plan: The patient has completed radiation treatment. The patient will return to radiation oncology clinic for routine followup in one month. I advised the patient to call or return sooner if they have any questions or concerns related to their recovery or treatment. ________________________________  Jodelle Gross, M.D., Ph.D.

## 2015-01-13 ENCOUNTER — Ambulatory Visit (HOSPITAL_COMMUNITY)
Admission: RE | Admit: 2015-01-13 | Discharge: 2015-01-13 | Disposition: A | Discharge status: Home / Self Care | Payer: Medicare Other | Source: Ambulatory Visit | Attending: Hematology | Admitting: Hematology

## 2015-01-13 ENCOUNTER — Other Ambulatory Visit: Payer: Self-pay | Admitting: Radiology

## 2015-01-13 ENCOUNTER — Encounter (HOSPITAL_COMMUNITY): Payer: Self-pay

## 2015-01-13 DIAGNOSIS — Z9081 Acquired absence of spleen: Secondary | ICD-10-CM | POA: Diagnosis not present

## 2015-01-13 DIAGNOSIS — Z90411 Acquired partial absence of pancreas: Secondary | ICD-10-CM | POA: Diagnosis not present

## 2015-01-13 DIAGNOSIS — C259 Malignant neoplasm of pancreas, unspecified: Secondary | ICD-10-CM | POA: Diagnosis present

## 2015-01-13 DIAGNOSIS — R1013 Epigastric pain: Secondary | ICD-10-CM | POA: Insufficient documentation

## 2015-01-13 DIAGNOSIS — Z86711 Personal history of pulmonary embolism: Secondary | ICD-10-CM | POA: Insufficient documentation

## 2015-01-13 DIAGNOSIS — Z7901 Long term (current) use of anticoagulants: Secondary | ICD-10-CM | POA: Diagnosis not present

## 2015-01-13 DIAGNOSIS — Z79899 Other long term (current) drug therapy: Secondary | ICD-10-CM | POA: Diagnosis not present

## 2015-01-13 DIAGNOSIS — Z9049 Acquired absence of other specified parts of digestive tract: Secondary | ICD-10-CM | POA: Diagnosis not present

## 2015-01-13 DIAGNOSIS — R110 Nausea: Secondary | ICD-10-CM | POA: Insufficient documentation

## 2015-01-13 DIAGNOSIS — I10 Essential (primary) hypertension: Secondary | ICD-10-CM | POA: Diagnosis not present

## 2015-01-13 DIAGNOSIS — R634 Abnormal weight loss: Secondary | ICD-10-CM | POA: Diagnosis not present

## 2015-01-13 DIAGNOSIS — Z86718 Personal history of other venous thrombosis and embolism: Secondary | ICD-10-CM | POA: Insufficient documentation

## 2015-01-13 DIAGNOSIS — K219 Gastro-esophageal reflux disease without esophagitis: Secondary | ICD-10-CM | POA: Diagnosis not present

## 2015-01-13 DIAGNOSIS — R630 Anorexia: Secondary | ICD-10-CM | POA: Insufficient documentation

## 2015-01-13 DIAGNOSIS — C252 Malignant neoplasm of tail of pancreas: Secondary | ICD-10-CM

## 2015-01-13 DIAGNOSIS — Z882 Allergy status to sulfonamides status: Secondary | ICD-10-CM | POA: Diagnosis not present

## 2015-01-13 HISTORY — DX: Malignant neoplasm of pancreas, unspecified: C25.9

## 2015-01-13 LAB — BASIC METABOLIC PANEL
ANION GAP: 8 (ref 5–15)
CALCIUM: 8.4 mg/dL — AB (ref 8.9–10.3)
CO2: 26 mmol/L (ref 22–32)
Chloride: 106 mmol/L (ref 101–111)
Creatinine, Ser: 0.66 mg/dL (ref 0.44–1.00)
GFR calc Af Amer: >60 mL/min (ref >60)
GLUCOSE: 115 mg/dL — AB (ref 65–99)
Potassium: 4.9 mmol/L (ref 3.5–5.1)
SODIUM: 140 mmol/L (ref 135–145)

## 2015-01-13 LAB — CBC
HCT: 40.2 % (ref 36.0–46.0)
HEMOGLOBIN: 12.8 g/dL (ref 12.0–15.0)
MCH: 31.9 pg (ref 26.0–34.0)
MCHC: 31.8 g/dL (ref 30.0–36.0)
MCV: 100.2 fL — ABNORMAL HIGH (ref 78.0–100.0)
Platelets: 243 10*3/uL (ref 150–400)
RBC: 4.01 MIL/uL (ref 3.87–5.11)
RDW: 17.2 % — AB (ref 11.5–15.5)
WBC: 5.8 10*3/uL (ref 4.0–10.5)

## 2015-01-13 LAB — PROTIME-INR
INR: 0.92 (ref 0.00–1.49)
PROTHROMBIN TIME: 12.5 s (ref 11.6–15.2)

## 2015-01-13 LAB — APTT: APTT: 30 s (ref 24–37)

## 2015-01-13 MED ORDER — BUPIVACAINE HCL (PF) 0.5 % IJ SOLN
30.0000 mL | Freq: Once | INTRAMUSCULAR | Status: DC
  Filled 2015-01-13: qty 30

## 2015-01-13 MED ORDER — CEFAZOLIN SODIUM-DEXTROSE 2-3 GM-% IV SOLR
INTRAVENOUS | Status: AC
  Administered 2015-01-13: 2 g via INTRAVENOUS
  Filled 2015-01-13: qty 50

## 2015-01-13 MED ORDER — HYDROMORPHONE HCL 2 MG/ML IJ SOLN
INTRAMUSCULAR | Status: DC
Start: 2015-01-13 — End: 2015-01-13
  Filled 2015-01-13: qty 2

## 2015-01-13 MED ORDER — HYDROMORPHONE HCL 2 MG/ML IJ SOLN: 1.0000 mg | INTRAMUSCULAR | Status: DC | PRN

## 2015-01-13 MED ORDER — FENTANYL CITRATE (PF) 100 MCG/2ML IJ SOLN
INTRAMUSCULAR | Status: AC | PRN
  Administered 2015-01-13: 50 ug via INTRAVENOUS
  Administered 2015-01-13: 25 ug via INTRAVENOUS

## 2015-01-13 MED ORDER — MIDAZOLAM HCL 2 MG/2ML IJ SOLN
INTRAMUSCULAR | Status: AC | PRN
  Administered 2015-01-13: 1 mg via INTRAVENOUS
  Administered 2015-01-13: 0.5 mg via INTRAVENOUS
  Administered 2015-01-13: 1 mg via INTRAVENOUS
  Administered 2015-01-13: 0.5 mg via INTRAVENOUS
  Administered 2015-01-13: 1 mg via INTRAVENOUS

## 2015-01-13 MED ORDER — CEFAZOLIN SODIUM-DEXTROSE 2-3 GM-% IV SOLR
2.0000 g | Freq: Once | INTRAVENOUS | Status: AC
  Administered 2015-01-13: 2 g via INTRAVENOUS
  Filled 2015-01-13: qty 50

## 2015-01-13 MED ORDER — ALCOHOL 98 % IV SOLN
50.0000 mL | Freq: Once | INTRAVENOUS | Status: DC
  Filled 2015-01-13: qty 50

## 2015-01-13 MED ORDER — HYDROCODONE-ACETAMINOPHEN 5-325 MG PO TABS
1.0000 | ORAL_TABLET | ORAL | Status: DC | PRN
  Filled 2015-01-13: qty 2

## 2015-01-13 MED ORDER — SODIUM CHLORIDE 0.9 % IV SOLN: INTRAVENOUS | Status: DC

## 2015-01-13 MED ORDER — METHYLPREDNISOLONE ACETATE 40 MG/ML IJ SUSP
80.0000 mg | Freq: Once | INTRAMUSCULAR | Status: DC
  Filled 2015-01-13: qty 2

## 2015-01-13 MED ORDER — SODIUM CHLORIDE 0.9 % IV SOLN
Freq: Once | INTRAVENOUS | Status: AC
  Administered 2015-01-13: 10:00:00 via INTRAVENOUS

## 2015-01-13 MED ORDER — HEPARIN SOD (PORK) LOCK FLUSH 100 UNIT/ML IV SOLN
250.0000 [IU] | INTRAVENOUS | Status: AC | PRN
  Administered 2015-01-13 (×2): 250 [IU]
  Filled 2015-01-13: qty 3

## 2015-01-13 MED ORDER — FENTANYL CITRATE (PF) 100 MCG/2ML IJ SOLN
INTRAMUSCULAR | Status: AC
  Filled 2015-01-13: qty 4

## 2015-01-13 MED ORDER — MIDAZOLAM HCL 2 MG/2ML IJ SOLN
INTRAMUSCULAR | Status: AC
  Filled 2015-01-13: qty 6

## 2015-01-13 NOTE — Discharge Instructions (Signed)
Sympathetic Nerve Block, Care After Refer to this sheet in the next few weeks. These instructions provide you with information on caring for yourself after your procedure. Your health care provider may also give you more specific instructions. Your treatment has been planned according to current medical practices, but problems sometimes occur. Call your health care provider if you have any problems or questions after your procedure. WHAT TO EXPECT AFTER THE PROCEDURE After a sympathetic nerve block, it is typical to have the following:  Soreness.  Feeling of warmth.  Weakness.  Numbness. If you had a stellate ganglion block, you may also have:   Voice changes.  A droopy eyelid.  Trouble swallowing.  A stuffy nose. HOME CARE INSTRUCTIONS   Take medicines only as directed by your health care provider.  Rest at home for the first day after your procedure and then you may be able to return to your usual activities.  You may eat what you usually do.  If you have any trouble swallowing, take small bites and small sips of water until you are able to swallow normally.  You may bathe and shower.  Keep all follow-up visits as directed by your health care provider. SEEK MEDICAL CARE IF:  You have numbness that lasts longer than 8 hours.  You continue to have pain for more than 24 hours after your procedure. SEEK IMMEDIATE MEDICAL CARE IF:  You are unable to swallow.  You have chest pain or trouble breathing.   This information is not intended to replace advice given to you by your health care provider. Make sure you discuss any questions you have with your health care provider.   Document Released: 06/01/2013 Document Reviewed: 06/01/2013 Elsevier Interactive Patient Education 2016 Elsevier Inc. Sympathetic Nerve Block A sympathetic nerve block is a procedure done to find out if your sympathetic nerves are damaged. You may also have this procedure to relieve pain from damaged  sympathetic nerves. Sympathetic nerves leave your spinal cord and come together in a clump of nerves (ganglion). Your sympathetic nerves control some involuntary functions of your body. These include sweating, blood flow, and the widening and narrowing of blood vessels. When these nerves are damaged, they may send impulses to the muscles and cause them to contract. Eventually this can lead to long-standing (chronic) pain.  During a sympathetic nerve block, medicine is used to numb your sympathetic nerves. If your pain is caused by damaged sympathetic nerves, the pain will go away. The pain usually returns when the medicine wears off, but sometimes pain relief continues even after the medicine has worn off. You may have this procedure regularly to give you prolonged periods of pain relief. The injection site of the medicine depends on where your pain is. If you have:  Upper body pain, you may have an injection in your neck to numb the ganglion in that area (stellate ganglion block).  Abdominal pain, you may have an injection in the middle of your back to numb the ganglion in that area (celiac plexus block).  Lower body pain, you may have an injection in your lower back to numb the ganglion in that area (lumbar sympathetic block). LET [facility] CARE PROVIDER KNOW ABOUT:  Any allergies you have.  All medicines you are taking, including vitamins, herbs, eye drops, creams, and over-the-counter medicines.  Previous problems you or members of your family have had with the use of anesthetics.  Any blood disorders you have.  Previous surgeries you have had.  Medical conditions you have. RISKS AND COMPLICATIONS Generally, this is a safe procedure. However, as with any procedure, problems can occur. Possible problems include:  Failure of the procedure to relieve your pain.  Worse pain (usually temporary).  Bleeding.  Infection.  Blood vessel damage.  Nerve damage. BEFORE THE  PROCEDURE  You may meet with your health care provider or a pain management specialist.  Do not eat or drink anything after midnight on the night before the procedure or as directed by your health care provider.  Plan to have someone take you home after the procedure. PROCEDURE   Your health care provider will insert an IV tube in a vein.  You may be given a medicine that makes you relax and go to sleep (general anesthetic).  Numbing medicine (local anesthetic) will be injected into the skin over the affected area.  The health care provider then inserts a needle into the numbed area. Imaging studies help your provider position the needle.  Usually X-ray guidance (fluoroscopy) is used.  Sometimes a CT scan is done instead.  Once the needle is in the proper position, numbing medicine is injected into the nerves.  The needle and IV tube are removed. Small bandages may be placed over the insertion sites. AFTER THE PROCEDURE  You will stay in a recovery area until the sedation wears off and your health care provider says you can go home.  You may notice redness in the areas of the body where medicine was injected.   This information is not intended to replace advice given to you by your health care provider. Make sure you discuss any questions you have with your health care provider.   Document Released: 07/22/2002 Document Revised: 01/20/2013 Document Reviewed: 12/23/2012 Elsevier Interactive Patient Education 2016 Elsevier Inc. Moderate Conscious Sedation, Adult Sedation is the use of medicines to promote relaxation and relieve discomfort and anxiety. Moderate conscious sedation is a type of sedation. Under moderate conscious sedation you are less alert than normal but are still able to respond to instructions or stimulation. Moderate conscious sedation is used during short medical and dental procedures. It is milder than deep sedation or general anesthesia and allows you to return  to your regular activities sooner. LET [facility] CARE PROVIDER KNOW ABOUT:   Any allergies you have.  All medicines you are taking, including vitamins, herbs, eye drops, creams, and over-the-counter medicines.  Use of steroids (by mouth or creams).  Previous problems you or members of your family have had with the use of anesthetics.  Any blood disorders you have.  Previous surgeries you have had.  Medical conditions you have.  Possibility of pregnancy, if this applies.  Use of cigarettes, alcohol, or illegal drugs. RISKS AND COMPLICATIONS Generally, this is a safe procedure. However, as with any procedure, problems can occur. Possible problems include:  Oversedation.  Trouble breathing on your own. You may need to have a breathing tube until you are awake and breathing on your own.  Allergic reaction to any of the medicines used for the procedure. BEFORE THE PROCEDURE  You may have blood tests done. These tests can help show how well your kidneys and liver are working. They can also show how well your blood clots.  A physical exam will be done.  Only take medicines as directed by your health care provider. You may need to stop taking medicines (such as blood thinners, aspirin, or nonsteroidal anti-inflammatory drugs) before the procedure.   Do not eat or  drink at least 6 hours before the procedure or as directed by your health care provider.  Arrange for a responsible adult, family member, or friend to take you home after the procedure. He or she should stay with you for at least 24 hours after the procedure, until the medicine has worn off. PROCEDURE   An intravenous (IV) catheter will be inserted into one of your veins. Medicine will be able to flow directly into your body through this catheter. You may be given medicine through this tube to help prevent pain and help you relax.  The medical or dental procedure will be done. AFTER THE PROCEDURE  You will stay in  a recovery area until the medicine has worn off. Your blood pressure and pulse will be checked.   Depending on the procedure you had, you may be allowed to go home when you can tolerate liquids and your pain is under control.   This information is not intended to replace advice given to you by your health care provider. Make sure you discuss any questions you have with your health care provider.   Document Released: 10/10/2000 Document Revised: 02/05/2014 Document Reviewed: 09/22/2012 Elsevier Interactive Patient Education 2016 Elsevier Inc.  Moderate Conscious Sedation, Adult, Care After Refer to this sheet in the next few weeks. These instructions provide you with information on caring for yourself after your procedure. Your health care provider may also give you more specific instructions. Your treatment has been planned according to current medical practices, but problems sometimes occur. Call your health care provider if you have any problems or questions after your procedure. WHAT TO EXPECT AFTER THE PROCEDURE  After your procedure:  You may feel sleepy, clumsy, and have poor balance for several hours.  Vomiting may occur if you eat too soon after the procedure. HOME CARE INSTRUCTIONS  Do not participate in any activities where you could become injured for at least 24 hours. Do not:  Drive.  Swim.  Ride a bicycle.  Operate heavy machinery.  Cook.  Use power tools.  Climb ladders.  Work from a high place.  Do not make important decisions or sign legal documents until you are improved.  If you vomit, drink water, juice, or soup when you can drink without vomiting. Make sure you have little or no nausea before eating solid foods.  Only take over-the-counter or prescription medicines for pain, discomfort, or fever as directed by your health care provider.  Make sure you and your family fully understand everything about the medicines given to you, including what side  effects may occur.  You should not drink alcohol, take sleeping pills, or take medicines that cause drowsiness for at least 24 hours.  If you smoke, do not smoke without supervision.  If you are feeling better, you may resume normal activities 24 hours after you were sedated.  Keep all appointments with your health care provider. SEEK MEDICAL CARE IF:  Your skin is pale or bluish in color.  You continue to feel nauseous or vomit.  Your pain is getting worse and is not helped by medicine.  You have bleeding or swelling.  You are still sleepy or feeling clumsy after 24 hours. SEEK IMMEDIATE MEDICAL CARE IF:  You develop a rash.  You have difficulty breathing.  You develop any type of allergic problem.  You have a fever. MAKE SURE YOU:  Understand these instructions.  Will watch your condition.  Will get help right away if you are not doing  well or get worse.   This information is not intended to replace advice given to you by your health care provider. Make sure you discuss any questions you have with your health care provider.   Document Released: 11/05/2012 Document Revised: 02/05/2014 Document Reviewed: 11/05/2012 Elsevier Interactive Patient Education [employer].

## 2015-01-13 NOTE — Progress Notes (Signed)
Patient ID: [name], female   DOB: [birth year], 69 y.o.   MRN: [id number]    Referring Physician(s): Feng,Yan  Chief Complaint:  Recurrent pancreatic cancer, persistent abdominal pain  Subjective: Patient familiar  to IR service from prior left upper quadrant abscess drainage in March 2016, left thoracentesis, and most recently suprapubic catheter placement on 10/19/14. She has history of pancreatic cancer, status post distal pancreatectomy, splenectomy and partial colectomy in March 2016. 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 She has since had DVT recurrence and is currently on Lovenox therapy. She had a local recurrence of her pancreatic cancer as well in September 2016. She continues to have abdominal pain due to local recurrent pancreatic cancer despite treatment with fentanyl patch OxyContin and oxycodone. She also continues to have nausea anorexia  and weight loss. Request has now been received from oncology for celiac plexus neuroloysis and she presents today for the procedure. Patient currently denies fever, headache, chest pain, significant dyspnea, cough or abnormal bleeding.Additional hx as below. Past Medical History  Diagnosis Date  . Pancreas cancer [facility]) Jan 2016  . Hypertension   . Arthritis   . GERD (gastroesophageal reflux disease)    Past Surgical History  Procedure Laterality Date  . Cholecystectomy    . Abdominal hysterectomy    . Knee surgery Bilateral     scope  . [place] filter    . Back surgery    . Eus N/A 02/26/2014    Procedure: UPPER ENDOSCOPIC ULTRASOUND (EUS) LINEAR;  Surgeon: Beryle Beams, MD;  Location: [facility] ENDOSCOPY;  Service: Endoscopy;  Laterality: N/A;  . Fine needle aspiration N/A 02/26/2014    Procedure: FINE NEEDLE ASPIRATION (FNA) LINEAR;  Surgeon: Beryle Beams, MD;  Location: [facility] ENDOSCOPY;  Service: Endoscopy;  Laterality: N/A;  . Breast lumpectomy Right   . Spine surgery    .  Xi robotic assisted laparoscopic distal pancreatectomy N/A 04/08/2014    Procedure: XI ROBOTIC ASSISTED CONVERTED LAPAROSCOPIC HAND ASSITED DISTAL PANCREATECTOMY/SPLENECTOMY WITH PARTIAL COLECTOMY;  Surgeon: [name], MD;  Location: [facility] ORS;  Service: General;  Laterality: N/A;  . Colon surgery    . Diagnostic laparoscopy    . Cataract extraction      left eye  . Eus N/A 11/05/2014    Procedure: FULL UPPER ENDOSCOPIC ULTRASOUND (EUS) RADIAL;  Surgeon: Carol Ada, MD;  Location: [facility] ENDOSCOPY;  Service: Endoscopy;  Laterality: N/A;  . Fine needle aspiration N/A 11/05/2014    Procedure: FINE NEEDLE ASPIRATION (FNA) LINEAR;  Surgeon: Carol Ada, MD;  Location: [facility] ENDOSCOPY;  Service: Endoscopy;  Laterality: N/A;     Allergies: Sulfa antibiotics  Medications: Prior to Admission medications   Medication Sig Start Date End Date Taking? Authorizing Provider  atorvastatin (LIPITOR) 40 MG tablet Take 1 tablet (40 mg total) by mouth daily. 05/05/14  Yes Gerlene Fee, NP  atropine 1 % ophthalmic solution Place 1 drop into the left eye 2 (two) times daily.   Yes Historical Provider, MD  cromolyn (OPTICROM) 4 % ophthalmic solution Place 1 drop into both eyes 4 (four) times daily.    Yes Historical Provider, MD  DORZOLAMIDE HCL-TIMOLOL MAL OP Place 1 drop into the left eye 2 (two) times daily.    Yes Historical Provider, MD  dronabinol (MARINOL) 2.5 MG capsule Take 1-2 capsules (2.5-5 mg total) by mouth 2 (two) times daily before a meal. 12/01/14  Yes Truitt Merle, MD  oxymorphone (OPANA ER) 10 MG 12 hr tablet Take 15 mg by mouth every 12 (twelve) hours.   Yes Historical Provider, MD  prochlorperazine (COMPAZINE) 5 MG/ML injection Inject 2 mLs (10 mg total) into the vein as directed. 12/28/14  Yes Truitt Merle, MD  promethazine (PHENERGAN) 25 MG suppository Place 0.5-1 suppositories (12.5-25 mg total) rectally every 6 (six) hours as needed for nausea or vomiting. 12/28/14  Yes Truitt Merle, MD  propranolol ER  (INDERAL LA) 120 MG 24 hr capsule TAKE 1 CAPSULE(120 MG) BY MOUTH DAILY 12/21/14  Yes Golden Circle, FNP  CARTIA XT 120 MG 24 hr capsule TAKE 1 CAPSULE BY MOUTH EVERY DAY 01/10/15   Golden Circle, FNP  enoxaparin (LOVENOX) 100 MG/ML injection Inject 1 mL (100 mg total) into the skin daily. 10/13/14   Truitt Merle, MD  fentaNYL (DURAGESIC - DOSED MCG/HR) 12 MCG/HR Place 1 patch (12.5 mcg total) onto the skin every 3 (three) days. 12/21/14   Golden Circle, FNP  latanoprost (XALATAN) 0.005 % ophthalmic solution Place 1 drop into the right eye at bedtime.     Historical Provider, MD  mirtazapine (REMERON) 15 MG tablet Take 1 tablet (15 mg total) by mouth at bedtime. 12/09/14   Truitt Merle, MD  Multiple Vitamin (MULTIVITAMIN WITH MINERALS) TABS tablet Take 2 tablets by mouth daily.    Historical Provider, MD  ondansetron (ZOFRAN-ODT) 4 MG disintegrating tablet DISSOLVE 1 TABLET(4 MG) ON THE TONGUE EVERY 8 HOURS AS NEEDED FOR NAUSEA OR VOMITING 10/05/14   Golden Circle, FNP  oxyCODONE 30 MG 12 hr tablet Take 30 mg by mouth every 12 (twelve) hours. 12/01/14   Truitt Merle, MD  Oxycodone HCl 10 MG TABS Take 1 tablet (10 mg total) by mouth every 4 (four) hours as needed. 01/07/15   Truitt Merle, MD  pantoprazole (PROTONIX) 40 MG tablet Take 1 tablet (40 mg total) by mouth 2 (two) times daily before a meal. 10/20/14   Golden Circle, FNP  potassium chloride (KLOR-CON) 20 MEQ packet Take 20 mEq by mouth as directed. Take 40 meq daily by mouth   For  7  Days ;  Then 20 meq daily by mouth. 01/07/15   Truitt Merle, MD  prednisoLONE acetate (PRED FORTE) 1 % ophthalmic suspension Place 1 drop into the left eye 4 (four) times daily.     Historical Provider, MD  primidone (MYSOLINE) 50 MG tablet TAKE 1 TABLET BY MOUTH EVERY NIGHT AT BEDTIME 12/02/14   Rebecca S Tat, DO  prochlorperazine (COMPAZINE) 10 MG tablet Take 1 tablet (10 mg total) by mouth every 6 (six) hours as needed. 12/09/14   Truitt Merle, MD     Vital Signs: BP 126/75  mmHg  Pulse 80  Temp(Src) 98.4 F (36.9 C)  Resp 18  SpO2 95%  Physical Exam patient awake, alert. Chest with few right basilar crackles, slightly diminished breath sounds left base; heart with regular rate and rhythm. Abdomen soft, positive bowel sounds, mild to moderate epigastric tenderness to palpation. Intake suprapubic catheter; lower extremities with 1-2+ edema bilaterally.  Imaging: No results found.  Labs:  CBC:  Recent Labs  12/11/14 0538 12/17/14 1107 01/07/15 1223 01/13/15 1010  WBC 5.7 5.2 6.1 5.8  HGB 11.8* 11.6 13.6 12.8  HCT 35.9* 34.9 41.5 40.2  PLT 222 199 249 243    COAGS:  Recent Labs  04/08/14 0630 04/09/14 0404 05/09/14 0655 10/19/14 1155  INR 1.06 1.12 1.08 0.98  APTT 40* 33  --   --  BMP:  Recent Labs  12/09/14 1130 12/10/14 1715 12/11/14 0538 12/12/14 0817 12/17/14 1107 01/07/15 1223  NA 141 144 143 141 142 141  K 3.5 3.9 2.8* 3.3* 3.2* 2.9*  CL 102 109 112* 108  --   --   CO2 24 23 24 25 27 25   GLUCOSE 117* 95 94 119* 114 122  BUN 11 10 8 6  5.5* <4.0*  CALCIUM 9.6 8.8* 8.5* 8.7* 8.7 8.9  CREATININE 0.74 0.70 0.62 0.53 0.6 0.7  GFRNONAA >60 >60 >60 >60  --   --   GFRAA >60 >60 >60 >60  --   --     LIVER FUNCTION TESTS:  Recent Labs  12/09/14 1130 12/10/14 1715 12/17/14 1107 01/07/15 1223  BILITOT 1.7* 1.2 0.33 0.62  AST 65* 51* 69* 39*  ALT 19 12* 16 9  ALKPHOS 60 52 53 93  PROT 7.8 6.8 5.5* 6.9  ALBUMIN 3.6 3.2* 2.2* 2.5*    Assessment and Plan: Patient with history of recurrent pancreatic cancer and persistent epigastric pain despite narcotic therapy. Request now received for CT guided celiac plexus block. Details/risks of procedure, including but not limited to, internal bleeding, infection, injury to adjacent organs, hypotension, diarrhea, inability to eradicate abdominal pain discussed with patient/daughter with their understanding and consent. Procedure scheduled for today.   Signed: D. Rowe Robert 01/13/2015, 10:41 AM   I spent a total of 15 minutes at the the patient's bedside AND on the patient's hospital floor or unit, greater than 50% of which was counseling/coordinating care for CT-guided celiac plexus block

## 2015-01-13 NOTE — Procedures (Signed)
Interventional Radiology Procedure Note  Procedure:  CT guided neurolytic ablation of celiac plexus  Complications:  None  Estimated Blood Loss: < 10 mL  22 G Chiba needle advanced to level of celiac plexus, just superior to celiac axis. 3 mL of dilute contrast injection shows good spread across anterior surface of aorta. 4 mL of 0.5% Sensorcaine, 40 mg depo-medrol and 12 mL absolute dehydrated alcohol injected.  Plan:  Bedrest and observation for any pain exacerbation.  Venetia Night. Kathlene Cote, M.D Pager:  [phone]

## 2015-01-14 ENCOUNTER — Other Ambulatory Visit: Payer: Self-pay | Admitting: Family

## 2015-01-17 ENCOUNTER — Telehealth: Payer: Self-pay | Admitting: Radiology

## 2015-01-17 NOTE — Telephone Encounter (Signed)
Patient's daughter reports that patient is continuing to experience "quite a bit of pain".  No change 4 days post Celiac Plexus Block.  Denies fever, bleeding or swelling.  Experienced some nausea & diarrhea x 1 day post procedure.  Instructions to daughter (per Rowe Robert, PA-C):   Patient should continue to take pain meds as directed.   She should be taken to ED should she develop any of the following:  fever, bleeding, swelling, diarrhea, unresolved nausea & vomiting or severe abdominal pain.    Vin Yonke [place], RN 01/17/2015 10:15 AM

## 2015-01-18 ENCOUNTER — Ambulatory Visit (HOSPITAL_BASED_OUTPATIENT_CLINIC_OR_DEPARTMENT_OTHER): Payer: Medicare Other | Admitting: Hematology

## 2015-01-18 ENCOUNTER — Encounter: Payer: Self-pay | Admitting: Hematology

## 2015-01-18 VITALS — BP 119/67 | HR 85 | Temp 98.8°F | Resp 20 | Ht 69.0 in | Wt 141.3 lb

## 2015-01-18 DIAGNOSIS — C252 Malignant neoplasm of tail of pancreas: Secondary | ICD-10-CM | POA: Diagnosis present

## 2015-01-18 DIAGNOSIS — G893 Neoplasm related pain (acute) (chronic): Secondary | ICD-10-CM

## 2015-01-18 DIAGNOSIS — I82593 Chronic embolism and thrombosis of other specified deep vein of lower extremity, bilateral: Secondary | ICD-10-CM

## 2015-01-18 DIAGNOSIS — Z7901 Long term (current) use of anticoagulants: Secondary | ICD-10-CM | POA: Diagnosis not present

## 2015-01-18 MED ORDER — FENTANYL 37.5 MCG/HR TD PT72: 1.0000 | MEDICATED_PATCH | TRANSDERMAL | Status: AC

## 2015-01-18 MED ORDER — OXYCODONE HCL 15 MG PO TABS: 15.0000 mg | ORAL_TABLET | ORAL | Status: AC | PRN

## 2015-01-18 MED ORDER — MEGESTROL ACETATE 625 MG/5ML PO SUSP: 625.0000 mg | Freq: Every day | ORAL | Status: AC

## 2015-01-18 MED ORDER — DRONABINOL 5 MG PO CAPS: 5.0000 mg | ORAL_CAPSULE | Freq: Two times a day (BID) | ORAL | Status: AC

## 2015-01-18 NOTE — Progress Notes (Signed)
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 CT chest was negative.    02/26/2014 Pathology Results FNA of pancreatic mass showed adenocarcinoma    03/22/2014 Initial Diagnosis Pancreatic adenocarcinoma   04/08/2014 Surgery robotic converted to hand-assisted laparoscopic distal pancreatectomy, splenectomy, partial colectomy, and partial removal of the left renal capsule for pancreatic adenocarcinoma. Surgical margins were negative. pT3N0   04/08/2014 - 05/04/2014 Hospital Admission admitted for pancreatic surgery, complicated with abdominal abcess, which was drained and treated with abx for pseudomonas infection, difficulty with eating and ambulatory, malnutrition, was discharged to rehab     04/08/2014 Pathology Results pT3N0, moderately differentiated invasive adenocarcinoma, spanning 4.5 cm, invading colonic submucosa. Lymphovascular invasion is identified, perineural invasion is identified. 10 lymph nodes were negative. Surgical margins were negative.   05/09/2014 - 05/21/2014 Hospital Admission Pt was admited for chest and abdominal pain, found to have left side pleural effusion. thoracentesis  done by IR with 553ml fluids removed, cytology (-) malignant cells   10/02/2014 Imaging CT CAP showed a two new soft tissue versus cystic structures in the peripancreatic region, decresased left pleural effusion    11/05/2014 Pathology Results EGD/EUS needle biopsy of the soft tissue in the peripancreatic region showed adenocarcinoma    12/06/2014 - 12/20/2014 Radiation Therapy palliative radiaiton to peripancreatic lesion     HISTORY OF PRESENTING ILLNESS:  [name] 69 y.o. female is here because of her recurrent DVT and incidental discovered pancreatic mass.  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 She had IVC filter placed back then and put on coumadin. She had second episode of right leg DVT when she was on coumadin in 2009, and continued coumadin until one year ago when she had trouble for monitoring her INR. She was then switched to Aspirin 81mg  dialy. 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 CT abdomen was ordered to further evaluate the DVT and abdominal pain on 12/22/2013, which showed thrombus in IVC and right common iliac vein below IVC. CT scan also showed a 2.8 x 2.5 cm mass within the tail of pancreas, this was confirmed by the MRI abdomen, with probably encasement of the splenic vessels. No significant abdominal adenopathy. CT of the chest was negative for distant metastasis. She was referred to see GI Dr.Hung and will have EGD/EUS for pancreatic mass biopsy in a week.   She has been having abdominal pain after eating in the pat few month, along with diarrhea  and constipation, which has been chronic since her cholecystectomy in 199. Her appetite is fare, and weight is stable. She also has tremor since teenager, and got worse lately,  along with gait instaedyness, was seen by neurologist Dr. Carles Collet. She was put on primidone which has help quite bit.   She had both knee surgery and was using a crane until recently when she was diagnosed with DVT and she has been using a walker. She has been having some gait instability lately, she was referred by Dr. Carles Collet and MRI of brain showed mild chronic small vessel ischemia disease and cerebral atrophy.  CURRENT THERAPY: supportive care  INTERIM HISTORY [name] returns for follow-up with her daughter. She underwent celiac nerve block by interventional radiologist Dr. Kathlene Cote last week.she tolerated procedure well. Her abdominal pain has not changed much since then. I increased her final patch to 31mcg/hr, and she still has moderate and a persistent pain at 6-7/10, she takes oxycodone every 4 hours. Her appetite is still low, she eats 1 meal a day. She still on IV fluids 3 times a week by home care service. She has intermittent nausea, no vomiting. Still very week.   MEDICAL HISTORY:  Past Medical History  Diagnosis Date  . Pancreas cancer [facility]) Jan 2016  . Hypertension   . Arthritis   . GERD (gastroesophageal reflux disease)     SURGICAL HISTORY: Past Surgical History  Procedure Laterality Date  . Cholecystectomy    . Abdominal hysterectomy    . Knee surgery Bilateral     scope  . [place] filter    . Back surgery    . Eus N/A 02/26/2014    Procedure: UPPER ENDOSCOPIC ULTRASOUND (EUS) LINEAR;  Surgeon: Beryle Beams, MD;  Location: [facility] ENDOSCOPY;  Service: Endoscopy;  Laterality: N/A;  . Fine needle aspiration N/A 02/26/2014    Procedure: FINE NEEDLE ASPIRATION (FNA) LINEAR;  Surgeon: Beryle Beams, MD;  Location: [facility] ENDOS[facility];  Service: Endoscopy;  Laterality: N/A;  . Breast lumpectomy Right   . Spine surgery    . Xi robotic assisted laparoscopic distal pancreatectomy N/A 04/08/2014    Procedure: XI ROBOTIC ASSISTED CONVERTED LAPAROSCOPIC HAND ASSITED DISTAL  PANCREATECTOMY/SPLENECTOMY WITH PARTIAL COLECTOMY;  Surgeon: [name], MD;  Location: [facility] ORS;  Service: General;  Laterality: N/A;  . Colon surgery    . Diagnostic laparoscopy    . Cataract extraction      left eye  . Eus N/A 11/05/2014    Procedure: FULL UPPER ENDOSCOPIC ULTRASOUND (EUS) RADIAL;  Surgeon: Carol Ada, MD;  Location: [facility]OS[facility];  Service: Endoscopy;  Laterality: N/A;  . Fine needle aspiration N/A 11/05/2014    Procedure: FINE NEEDLE ASPIRATION (FNA) LINEAR;  Surgeon: Carol Ada, MD;  Location: [facility];  Service: Endoscopy;  Laterality: N/A;    SOCIAL HISTORY: History   Social History  . Marital Status: Divorced    Spouse Name: N/A    Number of Children: 3  . Years of Education: N/A   Occupational History   She used to work for [employer], retired now    Social History Main Topics  . Smoking status: Current Every Day Smoker -- 0.50 packs/day, for 40 years     Types: Cigarettes  . Smokeless tobacco: Not on file  . Alcohol Use: 0.0 oz/week, social drinker     0 Not specified per week     Comment: occ- glass of wine a week   . Drug Use: No  . Sexual  Activity: Not on file   Other Topics Concern  . Not on file   Social History Narrative   Divorced, 3 children   Right handed   Associates degree   3 per week    FAMILY HISTORY: Family History  Problem Relation Age of Onset  . Diabetes Father 66  . Diabetes Maternal Grandmother   . Hypertension Mother   . Glaucoma Sister   . Cataracts Maternal Grandmother   . Cancer Sister 32    Breast    ALLERGIES:  is allergic to sulfa antibiotics.  MEDICATIONS:  Current Outpatient Prescriptions  Medication Sig Dispense Refill  . atorvastatin (LIPITOR) 40 MG tablet TAKE 1 TABLET BY MOUTH EVERY DAY 30 tablet 0  . atropine 1 % ophthalmic solution Place 1 drop into the left eye 2 (two) times daily.    . brimonidine-timolol (COMBIGAN) 0.2-0.5 % ophthalmic solution 1 drop.    Marland Kitchen CARTIA XT 120 MG  24 hr capsule TAKE 1 CAPSULE BY MOUTH EVERY DAY 30 capsule 0  . cromolyn (OPTICROM) 4 % ophthalmic solution Place 1 drop into both eyes 4 (four) times daily.     . DORZOLAMIDE HCL-TIMOLOL MAL OP Place 1 drop into the left eye 2 (two) times daily.     Marland Kitchen enoxaparin (LOVENOX) 100 MG/ML injection Inject 1 mL (100 mg total) into the skin daily. 30 Syringe 3  . latanoprost (XALATAN) 0.005 % ophthalmic solution Place 1 drop into the right eye at bedtime.     . mirtazapine (REMERON) 15 MG tablet Take 1 tablet (15 mg total) by mouth at bedtime. 90 tablet 1  . Multiple Vitamin (MULTIVITAMIN WITH MINERALS) TABS tablet Take 2 tablets by mouth daily.    . ondansetron (ZOFRAN-ODT) 4 MG disintegrating tablet DISSOLVE 1 TABLET(4 MG) ON THE TONGUE EVERY 8 HOURS AS NEEDED FOR NAUSEA OR VOMITING 20 tablet 0  . oxymorphone (OPANA ER) 10 MG 12 hr tablet Take 15 mg by mouth every 12 (twelve) hours.    . pantoprazole (PROTONIX) 40 MG tablet Take 1 tablet (40 mg total) by mouth 2 (two) times daily before a meal. 90 tablet 3  . potassium chloride (KLOR-CON) 20 MEQ packet Take 20 mEq by mouth as directed. Take 40 meq daily by mouth   For  7  Days ;  Then 20 meq daily by mouth. 30 packet 1  . prednisoLONE acetate (PRED FORTE) 1 % ophthalmic suspension Place 1 drop into the left eye 4 (four) times daily.     . primidone (MYSOLINE) 50 MG tablet TAKE 1 TABLET BY MOUTH EVERY NIGHT AT BEDTIME 90 tablet 0  . prochlorperazine (COMPAZINE) 10 MG tablet Take 1 tablet (10 mg total) by mouth every 6 (six) hours as needed. 30 tablet 3  . prochlorperazine (COMPAZINE) 5 MG/ML injection Inject 2 mLs (10 mg total) into the vein as directed. 10 mL 0  . promethazine (PHENERGAN) 25 MG suppository Place 0.5-1 suppositories (12.5-25 mg total) rectally every 6 (six) hours as needed for nausea or vomiting. 12 each 0  . propranolol ER (INDERAL LA) 120 MG 24 hr capsule TAKE 1 CAPSULE(120 MG) BY MOUTH DAILY 90 capsule 3  . dronabinol (MARINOL) 5 MG  capsule Take 1 capsule (5 mg total) by mouth 2 (two) times daily before a meal. 60 capsule 0  . FentaNYL 37.5 MCG/HR PT72 Place 1 patch onto the skin every 3 (three) days. 5 patch 0  . megestrol (MEGACE ES) 625 MG/5ML suspension Take 5 mLs (625 mg  total) by mouth daily. 150 mL 0  . oxyCODONE (ROXICODONE) 15 MG immediate release tablet Take 1 tablet (15 mg total) by mouth every 4 (four) hours as needed for pain. 100 tablet 0   No current facility-administered medications for this visit.    REVIEW OF SYSTEMS:   Constitutional: Denies fevers, chills or abnormal night sweats, (+) fatigue  Eyes: Denies blurriness of vision, double vision or watery eyes Ears, nose, mouth, throat, and face: Denies mucositis or sore throat Respiratory: Denies cough, dyspnea or wheezes Cardiovascular: Denies palpitation, chest discomfort or lower extremity swelling Gastrointestinal:  (+) nausea and vomiting, no heartburn or change in bowel habits. (+) worsening abdominal pain  Skin: Denies abnormal skin rashes Lymphatics: Denies new lymphadenopathy or easy bruising Neurological:Denies numbness, tingling or new weaknesses Behavioral/Psych: Mood is stable, no new changes  Extremities: Bilateral lower extremity edema and leg pain, left more than right. All other systems were reviewed with the patient and are negative.  PHYSICAL EXAMINATION: ECOG PERFORMANCE STATUS: 3 - Symptomatic, >50% confined to bed  Filed Vitals:   01/18/15 1028  BP: 119/67  Pulse: 85  Temp: 98.8 F (37.1 C)  Resp: 20   Filed Weights   01/18/15 1028  Weight: 141 lb 4.8 oz (64.093 kg)    GENERAL:alert, no distress and comfortable, sitting in wheelchair.  SKIN: skin color, texture, turgor are normal, no rashes. She has some bruises on her abdomen at the Lovenox injection site. She also has a 2 cm subcutaneous nodule on the right flank area, likely a hematoma. EYES: normal, conjunctiva are pink and non-injected, sclera  clear OROPHARYNX:no exudate, no erythema and lips, buccal mucosa, and tongue normal  NECK: supple, thyroid normal size, non-tender, without nodularity LYMPH:  no palpable lymphadenopathy in the cervical, axillary or inguinal LUNGS: clear to auscultation and percussion with normal breathing effort HEART: regular rate & rhythm and no murmurs and no lower extremity edema ABDOMEN:abdomen soft, mild tenderness at mid and low abdomen, normal bowel sounds Musculoskeletal:no cyanosis of digits and no clubbing  PSYCH: alert & oriented x 3 with fluent speech NEURO: no focal motor/sensory deficits Ext: (+) Bilateral Ankle swelling  LABORATORY DATA:  I have reviewed the data as listed CBC Latest Ref Rng 01/13/2015 01/07/2015 12/17/2014  WBC 4.0 - 10.5 K/uL 5.8 6.1 5.2  Hemoglobin 12.0 - 15.0 g/dL 12.8 13.6 11.6  Hematocrit 36.0 - 46.0 % 40.2 41.5 34.9  Platelets 150 - 400 K/uL 243 249 199     CMP Latest Ref Rng 01/13/2015 01/07/2015 12/17/2014  Glucose 65 - 99 mg/dL 115(H) 122 114  BUN 6 - 20 mg/dL <5(L) <4.0(L) 5.5(L)  Creatinine 0.44 - 1.00 mg/dL 0.66 0.7 0.6  Sodium 135 - 145 mmol/L 140 141 142  Potassium 3.5 - 5.1 mmol/L 4.9 2.9(LL) 3.2(L)  Chloride 101 - 111 mmol/L 106 - -  CO2 22 - 32 mmol/L 26 25 27   Calcium 8.9 - 10.3 mg/dL 8.4(L) 8.9 8.7  Total Protein 6.4 - 8.3 g/dL - 6.9 5.5(L)  Total Bilirubin 0.20 - 1.20 mg/dL - 0.62 0.33  Alkaline Phos 40 - 150 U/L - 93 53  AST 5 - 34 U/L - 39(H) 69(H)  ALT 0 - 55 U/L - 9 16     PATHOLOGY REPORT 04/08/2014 REASON FOR ADDENDUM, AMENDMENT OR CORRECTION: [id number]: AMENDMENT: Amendment issued to report additional gross information in part #2. ds 04/14/14 04:55:45 PM FINAL DIAGNOSIS Diagnosis 04/08/2014 1. Colon, segmental resection, transverse - PANCREATIC ADENOCARCINOMA INVADING COLONIC SUBMUCOSA. - LYMPHOVASCULAR  INVASION IS IDENTIFIED. - THE COLONIC RESECTION MARGINS ARE NEGATIVE FOR ADENOCARCINOMA. 2. Pancreas, resection -  INVASIVE ADENOCARCINOMA, MODERATELY DIFFERENTIATED, SPANNING 4.5 CM. - LYMPHOVASCULAR INVASION IS IDENTIFIED. - PERINEURAL INVASION IS IDENTIFIED. - ADENOCARCINOMA IS BROADLY PRESENT AT T 10 mg 1 negative. Surgical margins were negative. HE CLINICAL COLONIC MARGIN OF SPECIMEN #2. - ADENOCARCINOMA IS ADHERENT TO THE RENAL CAPSULE, BUT DOES NOT INVOLVE RENAL PARENCHYMA. - ADENOCARCINOMA IS ADHERENT TO THE SPLENIC CAPSULE, BUT DOES NOT INVOLVE SPLENIC PARENCHYMA. - THE PANCREATIC PARENCHYMAL RESECTION MARGIN IS NEGATIVE FOR ADENOCARCINOMA. - THERE IS NO EVIDENCE OF CARCINOMA IN 10 OF 10 LYMPH NODES (0/10). - SEE ONCOLOGY TABLE BELOW. Microscopic Comment 2. PANCREAS (EXOCRINE): Procedure: Distal pancreatectomy with adherent renal capsule and portion of spleen and transverse colon resection. Tumor Site: Distal pancreatic tail. Tumor Size: 4.5 cm (gross measurement). Histologic Type: Adenocarcinoma. Histologic Grade (ductal carcinoma only): G2: Moderately differentiated Microscopic Tumor Extension: Adenocarcinoma involves colonic submucosa, renal capsule, and splenic capsule. Margins: The pancreatic parenchymal margin is negative for adenocarcinoma. Distance of invasive carcinoma from closest margin: 5.2 cm to the pancreatic parenchymal margin, see comment. Carcinoma in situ / high-grade dysplasia: Not identified. Treatment Effect: N/A. 1 of 3 Amended copy Amended FINAL for Barletta, Yeraldin A [id number]) Microscopic Comment(continued) Lymph-Vascular Invasion: Present, diffuse Perineural Invasion: Present, diffuse Lymph nodes: number examined 10; number positive: 0. Pathologic Staging: pT3, pN0. Additional Pathologic Findings: No significant findings. Ancillary Studies: N/A. Comment(s): There is a primary 4.5 cm moderately differentiated pancreatic adenocarcinoma present in the tail of the pancreas. There is direct extension of this tumor to the submucosa of the transverse colon (specimen  #1). In addition, adenocarcinoma extends to and involves the renal capsule and splenic capsule. Despite four lymph nodes being negative for parenchymal metastases, one of the four lymph nodes does show perinodal deposits of adenocarcinoma. The adenocarcinoma present in the submucosa of the transverse colon, renal capsule, splenic capsule, and perinodal soft tissue is interpreted to represent direct extension of the primary tumor, rather than metastatic sites and thus, the tumor is staged as above.  Comment There are malignant cells with prominent nucleili and intracytoplasmic vacuoles, arranged in acinar pattern. The overall morphologic findings favor a diagnosis of well differentiated adenocarcinoma.   Diagnosis 11/05/2014 FINE NEEDLE ASPIRATION, ENDOSCOPIC PANCREATIC TAIL (SPECIMEN 1 OF 1, COLLECTED ON 11/05/14): MALIGNANT CELLS PRESENT SEE COMMENT COMMENT: THE MALIGNANT CELLS ARE CONSISTENT WITH ADENOCARCINOMA. THE CASE WAS REVIEWED BY DR Tresa Moore, WHO CONCURS.   RADIOGRAPHIC STUDIES: I have personally reviewed the radiological images as listed and agreed with the findings in the report.  Ct Abdomen Pelvis W Contrast 10/02/2014    IMPRESSION: Foley catheter within thick-walled bladder, which may indicate infection/cystitis.  Two new soft tissue versus cystic structures in the peripancreatic region since 06/15/2014. Although these may represent pseudocysts, recurrent neoplasm is not entirely excluded and close interval follow-up or further evaluation is recommended.  Decreasing left pleural effusion which now appears slightly loculated. CT chest 10/07/2014 IMPRESSION: 1. Stable small left pleural effusion with associated passive atelectasis. No new lung nodule or other significant new findings in the chest. Stable small left internal mammary node warrants observation.   ASSESSMENT & PLAN:  70 year old female, with past medical history of recurrent bilateral lower extremity  DVTs, presented with acute extensive left lower extremity DVT and thrombus in the IVC and the right, iliac vein, and chronic DVT in the right popliteal vein. She was also found to have a pancreatic tail mass on the CT scan.  1. Pancreatic adenocarcinoma, pT3 N0 M0, stage IIA, local recurrence in 09/2014  - Her restaging CT scan from 10/02/2014 showed two small soft tissue vs cystic lesion in pancrease. -I discussed her biopsy results with patient and her daughter in details. Unfortunately she has local recurrence I'll check with her surgeon Dr. Barry Dienes, but unlikely she will offer more surgery.  -We reviewed her case in our tumor board. Unfortunately this is likely incurable disease.  -She completed  Palliative radiation,  But her pain is still not controlled. -she underwent celiac nerve block interventional radiology last week, so far no improvement on her pain yet. -Given her rapid deterioration, she is unlikely to be a candidate for systemic chemotherapy. -We discussed the goal of care with her and her daughter today, they agree with hospice now. I'll make a referral today   2. Abdominal pain due to local recurrent pancreatic cancer -her pain is still not well controlled  -- I recommend her to increase fentanyl patch from 25 to 37.32mcg/hr,  And I We'll increase her oxycodone from 10 mg to 50 mg every 4 hours.  3. Recurrent DVT  -continue lovenox 100 mg once daily.  4. Nausea, Anorexia, weight loss  -continue home IV 3 times a week -increase Marinol to 5 mg twice a day -add Megace 625 mg daily as a appetite stimulant -continue Compazine and Zofran  As needed for nausea -I encouraged her to continue nutrition supplement   Plan:  [facility] referral today, I'll remain to be her M.D. When she is under hospice care -I increased her pain medication today  All questions were answered. The patient knows to call the clinic with any problems, questions or concerns. I spent 25 minutes counseling  the patient face to face. The total time spent in the appointment was 30 minutes and more than 50% was on counseling.     Truitt Merle, MD 01/18/2015   1:29 PM

## 2015-01-19 ENCOUNTER — Ambulatory Visit: Payer: Medicare Other | Admitting: Hematology

## 2015-01-19 ENCOUNTER — Telehealth: Payer: Self-pay | Admitting: *Deleted

## 2015-01-19 NOTE — Telephone Encounter (Signed)
Spoke with Vickie, new pt referral @ [facility].  Referral made for hospice services today.  Vickie stated she would send someone to the home tomorrow 01/20/15.

## 2015-01-21 ENCOUNTER — Encounter: Payer: Self-pay | Admitting: Radiation Oncology

## 2015-01-21 ENCOUNTER — Ambulatory Visit
Admission: RE | Admit: 2015-01-21 | Discharge: 2015-01-21 | Disposition: A | Discharge status: Home / Self Care | Payer: Medicare Other | Source: Ambulatory Visit | Attending: Radiation Oncology | Admitting: Radiation Oncology

## 2015-01-21 VITALS — BP 123/82 | HR 93 | Temp 98.2°F | Resp 20 | Ht 69.0 in | Wt 139.1 lb

## 2015-01-21 DIAGNOSIS — C252 Malignant neoplasm of tail of pancreas: Secondary | ICD-10-CM

## 2015-01-21 NOTE — Progress Notes (Signed)
Follow up s/p radiation pancreas 12/06/14-12/20/14, in w/c, very fatigued, abdominakl pain 4/5 on 10 scale, will pick up fentanyl patch today,  Hospice referral will meet them today at their home, appetite poor,  Had nerve block done on 01/13/15  Just started to help some ,  1:30 PM BP 123/82 mmHg  Pulse 93  Temp(Src) 98.2 F (36.8 C) (Oral)  Resp 20  Ht 5\' 9"  (1.753 m)  Wt 139 lb 1.6 oz (63.095 kg)  BMI 20.53 kg/m2  SpO2 100%  Wt Readings from Last 3 Encounters:  01/21/15 139 lb 1.6 oz (63.095 kg)  01/18/15 141 lb 4.8 oz (64.093 kg)  01/07/15 146 lb 3.2 oz (66.316 kg)

## 2015-01-21 NOTE — Progress Notes (Signed)
Radiation Oncology         [phone] ________________________________  Name: [name] MRN: [id number]  Date: 01/21/2015  DOB: 10/05/1945  Follow-Up Visit Note  CC: Mauricio Po, FNP  Truitt Merle, MD  Diagnosis: Recurrent pancreatic cancer    ICD-9-CM ICD-10-CM   1. 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 12/06/2014 through 12/20/2014   Site/dose:   The patient was treated to the abdomen in a palliative manner using a 3-D conformal technique on our tomotherapy unit which corresponded to 5 treatment fields. The patient was treated to a dose of 30 gray in 10 fractions.  Narrative:  The patient returns today for routine follow-up. in w/c, She is very fatigued and has abdominal pain as a 4-5 on 10 scale. She could not tell much of a difference in her pain levels after getting radiation treatment. She will pick up a higher strength fentanyl patch today. She was on 25 mcg/hr. Hospice referral will meet her today at her home. She has a poor appetite. Had nerve block done on 01/13/15 which has started to help, "just a tiny bit." Typical pain is a 6-7. She still has nausea. She was struggling with constipation and is using Miralax regularly, which helps.  ALLERGIES:  is allergic to sulfa antibiotics.  Meds: Current Outpatient Prescriptions  Medication Sig Dispense Refill  . atorvastatin (LIPITOR) 40 MG tablet TAKE 1 TABLET BY MOUTH EVERY DAY 30 tablet 0  . atropine 1 % ophthalmic solution Place 1 drop into the left eye 2 (two) times daily.    . brimonidine-timolol (COMBIGAN) 0.2-0.5 % ophthalmic solution Place 1 drop into the left eye every 12 (twelve) hours.     Marland Kitchen CARTIA XT 120 MG 24 hr capsule TAKE 1 CAPSULE BY MOUTH EVERY DAY 30 capsule 0  . cromolyn (OPTICROM) 4 % ophthalmic solution Place 1 drop into both eyes 4 (four) times daily.     . DORZOLAMIDE HCL-TIMOLOL MAL OP  Place 1 drop into the left eye 2 (two) times daily.     Marland Kitchen dronabinol (MARINOL) 5 MG capsule Take 1 capsule (5 mg total) by mouth 2 (two) times daily before a meal. 60 capsule 0  . enoxaparin (LOVENOX) 100 MG/ML injection Inject 1 mL (100 mg total) into the skin daily. 30 Syringe 3  . latanoprost (XALATAN) 0.005 % ophthalmic solution Place 1 drop into the right eye at bedtime.     . mirtazapine (REMERON) 15 MG tablet Take 1 tablet (15 mg total) by mouth at bedtime. 90 tablet 1  . ondansetron (ZOFRAN-ODT) 4 MG disintegrating tablet DISSOLVE 1 TABLET(4 MG) ON THE TONGUE EVERY 8 HOURS AS NEEDED FOR NAUSEA OR VOMITING 20 tablet 0  . oxyCODONE (ROXICODONE) 15 MG immediate release tablet Take 1 tablet (15 mg total) by mouth every 4 (four) hours as needed for pain. 100 tablet 0  . pantoprazole (PROTONIX) 40 MG tablet Take 1 tablet (40 mg total) by mouth 2 (two) times daily before a meal. 90 tablet 3  . potassium chloride (KLOR-CON) 20 MEQ packet Take 20 mEq by mouth as directed. Take 40 meq daily by mouth   For  7  Days ;  Then 20 meq daily by mouth. 30 packet 1  . prednisoLONE acetate (PRED FORTE) 1 % ophthalmic suspension Place 1 drop into the left eye 4 (four) times daily.     . primidone (MYSOLINE)  50 MG tablet TAKE 1 TABLET BY MOUTH EVERY NIGHT AT BEDTIME 90 tablet 0  . prochlorperazine (COMPAZINE) 10 MG tablet Take 1 tablet (10 mg total) by mouth every 6 (six) hours as needed. 30 tablet 3  . promethazine (PHENERGAN) 25 MG suppository Place 0.5-1 suppositories (12.5-25 mg total) rectally every 6 (six) hours as needed for nausea or vomiting. 12 each 0  . propranolol ER (INDERAL LA) 120 MG 24 hr capsule TAKE 1 CAPSULE(120 MG) BY MOUTH DAILY 90 capsule 3  . FentaNYL 37.5 MCG/HR PT72 Place 1 patch onto the skin every 3 (three) days. (Patient not taking: Reported on 01/21/2015) 5 patch 0  . megestrol (MEGACE ES) 625 MG/5ML suspension Take 5 mLs (625 mg total) by mouth daily. (Patient not taking: Reported on  01/21/2015) 150 mL 0  . Multiple Vitamin (MULTIVITAMIN WITH MINERALS) TABS tablet Take 2 tablets by mouth daily. Reported on 01/21/2015    . oxymorphone (OPANA ER) 10 MG 12 hr tablet Take 15 mg by mouth every 12 (twelve) hours. Reported on 01/21/2015     No current facility-administered medications for this encounter.    Physical Findings: The patient is in no acute distress. Patient is alert and oriented.  height is 5' 9"  (1.753 m) and weight is 139 lb 1.6 oz (63.095 kg). Her oral temperature is 98.2 F (36.8 C). Her blood pressure is 123/82 and her pulse is 93. Her respiration is 20 and oxygen saturation is 100%.  The patient presents to the clinic in a wheelchair.  Lab Findings: Lab Results  Component Value Date   WBC 5.8 01/13/2015   WBC 6.1 01/07/2015   HGB 12.8 01/13/2015   HGB 13.6 01/07/2015   HCT 40.2 01/13/2015   HCT 41.5 01/07/2015   PLT 243 01/13/2015   PLT 249 01/07/2015    Lab Results  Component Value Date   NA 140 01/13/2015   NA 141 01/07/2015   K 4.9 01/13/2015   K 2.9* 01/07/2015   CHLORIDE 103 01/07/2015   CO2 26 01/13/2015   CO2 25 01/07/2015   GLUCOSE 115* 01/13/2015   GLUCOSE 122 01/07/2015   BUN <5* 01/13/2015   BUN <4.0* 01/07/2015   CREATININE 0.66 01/13/2015   CREATININE 0.7 01/07/2015   CREATININE 0.99 01/04/2014   BILITOT 0.62 01/07/2015   BILITOT 1.2 12/10/2014   ALKPHOS 93 01/07/2015   ALKPHOS 52 12/10/2014   AST 39* 01/07/2015   AST 51* 12/10/2014   ALT 9 01/07/2015   ALT 12* 12/10/2014   PROT 6.9 01/07/2015   PROT 6.8 12/10/2014   PROT 6.9 05/12/2013   ALBUMIN 2.5* 01/07/2015   ALBUMIN 3.2* 12/10/2014   CALCIUM 8.4* 01/13/2015   CALCIUM 8.9 01/07/2015   ANIONGAP 8 01/13/2015   ANIONGAP 13* 01/07/2015    Radiographic Findings: Ir Fluoro Guide Cv Line Right  12/29/2014  INDICATION: Pancreatic cancer, request for peripherally inserted central catheter for intravenous fluids. EXAM: ULTRASOUND AND FLUOROSCOPIC GUIDED PICC LINE  INSERTION MEDICATIONS: None. CONTRAST:  None FLUOROSCOPY TIME:  18 seconds. COMPLICATIONS: None immediate TECHNIQUE: The procedure, risks, benefits, and alternatives were explained to the patient's POA and informed written consent was obtained. A timeout was performed prior to the initiation of the procedure. The right upper extremity was prepped with chlorhexidine in a sterile fashion, and a sterile drape was applied covering the operative field. Maximum barrier sterile technique with sterile gowns and gloves were used for the procedure. A timeout was performed prior to the initiation of the procedure.  Local anesthesia was provided with 1% lidocaine. Under direct ultrasound guidance, the right brachial vein was accessed with a micropuncture kit after the overlying soft tissues were anesthetized with 1% lidocaine. An ultrasound image was saved for documentation purposes. A guidewire was advanced to the level of the superior caval-atrial junction for measurement purposes and the PICC line was cut to length. A peel-away sheath was placed and a 34 cm, 5 [place], dual lumen was inserted to level of the superior caval-atrial junction. A post procedure spot fluoroscopic was obtained. The catheter easily aspirated and flushed and was sutured in place. A dressing was placed. The patient tolerated the procedure well without immediate post procedural complication. FINDINGS: After catheter placement, the tip lies within the superior cavoatrial junction. The catheter aspirates and flushes normally and is ready for immediate use. IMPRESSION: Successful ultrasound and fluoroscopic guided placement of a right brachial vein approach, 34 cm, 5 [place], dual lumen power injectable PICC with tip at the superior caval-atrial junction. The PICC line is ready for immediate use. Read By:  Tsosie Billing PA-C Electronically Signed   By: Aletta Edouard M.D.   On: 12/29/2014 14:47   Ir US Guide Vasc Access Right  12/29/2014  INDICATION:  Pancreatic cancer, request for peripherally inserted central catheter for intravenous fluids. EXAM: ULTRASOUND AND FLUOROSCOPIC GUIDED PICC LINE INSERTION MEDICATIONS: None. CONTRAST:  None FLUOROSCOPY TIME:  18 seconds. COMPLICATIONS: None immediate TECHNIQUE: The procedure, risks, benefits, and alternatives were explained to the patient's POA and informed written consent was obtained. A timeout was performed prior to the initiation of the procedure. The right upper extremity was prepped with chlorhexidine in a sterile fashion, and a sterile drape was applied covering the operative field. Maximum barrier sterile technique with sterile gowns and gloves were used for the procedure. A timeout was performed prior to the initiation of the procedure. Local anesthesia was provided with 1% lidocaine. Under direct ultrasound guidance, the right brachial vein was accessed with a micropuncture kit after the overlying soft tissues were anesthetized with 1% lidocaine. An ultrasound image was saved for documentation purposes. A guidewire was advanced to the level of the superior caval-atrial junction for measurement purposes and the PICC line was cut to length. A peel-away sheath was placed and a 34 cm, 5 [place], dual lumen was inserted to level of the superior caval-atrial junction. A post procedure spot fluoroscopic was obtained. The catheter easily aspirated and flushed and was sutured in place. A dressing was placed. The patient tolerated the procedure well without immediate post procedural complication. FINDINGS: After catheter placement, the tip lies within the superior cavoatrial junction. The catheter aspirates and flushes normally and is ready for immediate use. IMPRESSION: Successful ultrasound and fluoroscopic guided placement of a right brachial vein approach, 34 cm, 5 [place], dual lumen power injectable PICC with tip at the superior caval-atrial junction. The PICC line is ready for immediate use. Read By:  Tsosie Billing PA-C Electronically Signed   By: Aletta Edouard M.D.   On: 12/29/2014 14:47   Ct Biopsy  01/13/2015  CLINICAL DATA:  Progressive metastatic pancreatic carcinoma with intractable abdominal pain radiating into back despite increasing narcotic regimen. The patient has been referred for celiac plexus neurolytic ablation for pain control. EXAM: CT GUIDED NEUROLYTIC ABLATION OF THE CELIAC AXIS ANESTHESIA/SEDATION: Versed 4.0 mg IV, Fentanyl 75 mcg IV Total Moderate Sedation Time 30 minutes. PROCEDURE: The procedure risks, benefits, and alternatives were explained to the patient. Questions regarding the procedure were encouraged and  answered. The patient understands and consents to the procedure. A time-out was performed prior to the procedure. The anterior abdominal wall was prepped with Betadine in a sterile fashion, and a sterile drape was applied covering the operative field. A sterile gown and sterile gloves were used for the procedure. Local anesthesia was provided with 1% Lidocaine. A 22 gauge Chiba needle was advanced under CT guidance to the level of the celiac plexus. After confirming needle tip position, approximately three ml of a 1:20 dilution of Omnipaque-300 contrast and saline was injected. Spread of diluted contrast material was confirmed by CT. A mixture of 4 ml of 0.5% Sensorcaine and 40 mg Depo-Medrol was then made. This was injected through the needle. Alcohol ablation of the celiac plexus was then performed with injection of 12 ml of absolute alcohol. COMPLICATIONS: None FINDINGS: Contrast injection showed excellent spread across the midline at the level of the celiac plexus. Alcohol ablation was successfully performed. Alcohol injection was stopped when the patient did start to experience exacerbation of pain. IMPRESSION: CT guided neurolytic ablation of the celiac plexus as above. Alcohol ablation was performed with 12 ml of absolute alcohol. Therapeutic injection of Sensorcaine and  Depo-Medrol was also performed. Electronically Signed   By: Aletta Edouard M.D.   On: 01/13/2015 17:42    Impression:  The patient is recovering from the effects of radiation. The patient is continuing to have pain even after radiation and nerve block. This would hopefully be managed by Fentanyl 37.5 mcg/hr.  Plan: I will see her on a PRN basis. In the meantime, she will be followed by med/onc and hospice. _____________________________________ Jodelle Gross, M.D., Ph.D.  This document serves as a record of services personally performed by Kyung Rudd, MD. It was created on his behalf by Darcus Austin, a trained medical scribe. The creation of this record is based on the scribe's personal observations and the provider's statements to them. This document has been checked and approved by the attending provider.

## 2015-01-28 ENCOUNTER — Telehealth: Payer: Self-pay | Admitting: *Deleted

## 2015-01-28 NOTE — Telephone Encounter (Signed)
[name] called.  She is in home of [name] who is getting one liter of normal saline on M-W-F.Fluids drip to gravity.  She has a double-lumen PICC line.  Both lumens flush easily, but neither will give a blood return.  Blood return was obtained last on Wednesday 12/28.  Suggested she try taking cap off and using a 20cc syringe to obtain blood return, also suggested moving the clamp and making sure the tubing is not crimped.  [name] tried these suggestions and still no blood return. Discussed with [name] RN for Dr. Burr Medico and with Selena Lesser NP. Discussed with Dr. Marin Olp who is on call for Dr. Burr Medico. He is fine with going ahead and using the PICC for normal saline.   Discussed with [name] and let her know Dr. Marin Olp is ok with using PICC for normal saline to gravity. They will measure arm at start and make sure there is no swelling or leakage of fluid.  Suggested that after 100cc of normal saline, check line for blood return and then again at end of infusion.  If patient receives normal saline today and Monday and still no blood return then she should call us on Tuesday (Monday is a holiday) and discuss with Dr. Burr Medico about further intervention.

## 2015-02-01 NOTE — Telephone Encounter (Signed)
Received call from [place], [place] @ [facility] re:  Pt's PICC catheter still does not give blood return.  [name] stated IVF running fine, no swelling at site, no redness;  Arm measurement still the same.  [name], RN and daughter had tried to flush PICC without blood return.  No complains voiced by pt. [name] wanted to know if Dr. Burr Medico would like for pt to come in for evaluation. [facility]   Phone    [phone].

## 2015-02-02 ENCOUNTER — Telehealth: Payer: Self-pay | Admitting: *Deleted

## 2015-02-02 NOTE — Telephone Encounter (Signed)
Colette Ribas, RN @ [facility] and left message on voice mail re:  OK to TPA PICC catheter per protocol as per Dr. Ernestina Penna instructions.

## 2015-02-03 ENCOUNTER — Telehealth: Payer: Self-pay | Admitting: *Deleted

## 2015-02-03 ENCOUNTER — Other Ambulatory Visit: Payer: Self-pay | Admitting: *Deleted

## 2015-02-03 MED ORDER — ALTEPLASE 2 MG IJ SOLR: 2.0000 mg | Freq: Once | INTRAMUSCULAR | Status: AC

## 2015-02-03 NOTE — Telephone Encounter (Signed)
Spoke with [name], RN @ [facility] and informed her re:  Order for Alteplase was faxed to [facility] to provide medication to pt for hospice nurse to administer into PICC Catheter.  Tanya voiced understanding. [facility]  Fax     [phone].

## 2015-02-07 ENCOUNTER — Other Ambulatory Visit: Payer: Self-pay | Admitting: Family

## 2015-02-17 ENCOUNTER — Other Ambulatory Visit: Payer: Self-pay | Admitting: Family

## 2015-02-22 ENCOUNTER — Telehealth: Payer: Self-pay | Admitting: *Deleted

## 2015-02-22 NOTE — Telephone Encounter (Signed)
Spoke with [name], hospice nurse and informed her that it is ok with Dr. Burr Medico to change pt's  suprapubic catheter every 30 days.   Tanya voiced understanding.

## 2015-02-22 NOTE — Telephone Encounter (Signed)
Received call from [place], [place] @ [facility] requesting a verbal order to change suprapubic catheter.  [name] stated pt has catheter changed every 30 days.   [name] stated pt has not had catheter changed since pt was discharged home from the hospital. [facility]   Phone     [phone].

## 2015-02-22 NOTE — Telephone Encounter (Signed)
Thu, please let her know that it's fine with me. Thanks.  Truitt Merle  02/22/2015

## 2015-03-11 ENCOUNTER — Telehealth: Payer: Self-pay | Admitting: *Deleted

## 2015-03-11 NOTE — Telephone Encounter (Signed)
Received vm call from [place] with [facility]ospice/[facility] reporting that pt has had significant decline, taking only sips of flluids & still receiving 1 L NS three times weekly.  She is having some n & v & has IV phenergan for this.  The daughter would like to have a DNR signed & they are asking if OK for Hospice physician to do this.  OK per Dr Burr Medico. Notified [name] @ her mobile # [phone].

## 2015-03-17 ENCOUNTER — Telehealth: Payer: Self-pay | Admitting: *Deleted

## 2015-03-30 NOTE — Telephone Encounter (Signed)
Message received from [place] at [facility].  Patient expired 2015-04-15 at 1:58pm.  For any questions, please call [phone]

## 2015-03-30 DEATH — deceased

## 2017-03-25 IMAGING — CT CT ABD-PELV W/O CM
2 of 4 series · 16 of 46 positions shown, 18 images · non-contrast
Comparison: 04/15/2014 and 04/16/2014

CLINICAL DATA: Status post splenectomy and distal pancreatectomy,
post drainage of intra-abdominal abscess

EXAM:
CT ABDOMEN AND PELVIS WITHOUT CONTRAST
TECHNIQUE: Multidetector CT imaging of the abdomen and pelvis was performed
following the standard protocol without IV contrast.

[Series 2: rtn a/p w/o · axial · non-contrast · 0.87mm/px · z∈[-445,-45]mm · 13 of 88 slices shown, 15 images]
[im 4/88  soft-tissue]
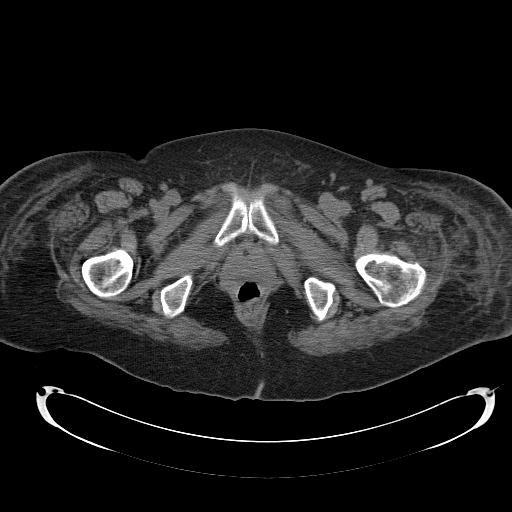
[im 4/88  bone]
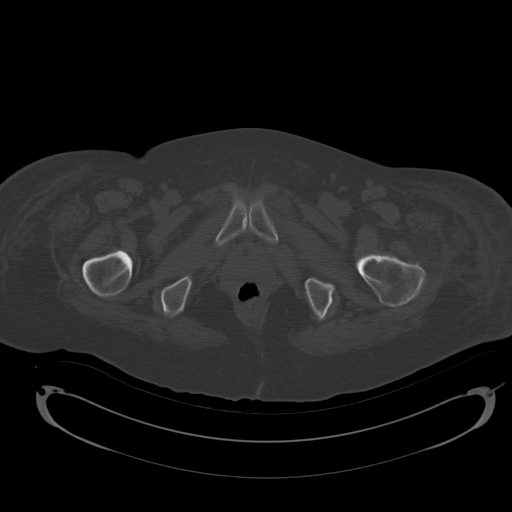
[im 12/88  soft-tissue]
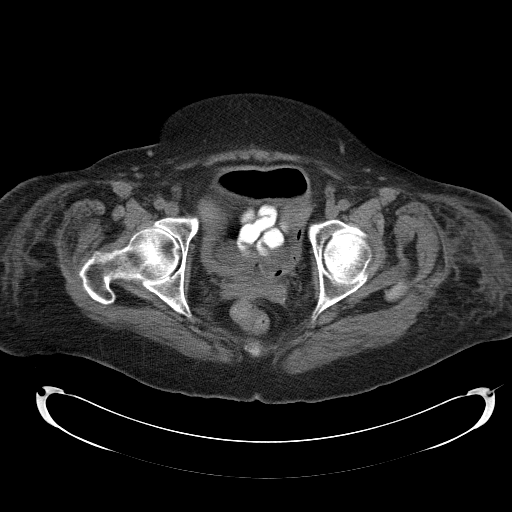
[im 19/88  soft-tissue]
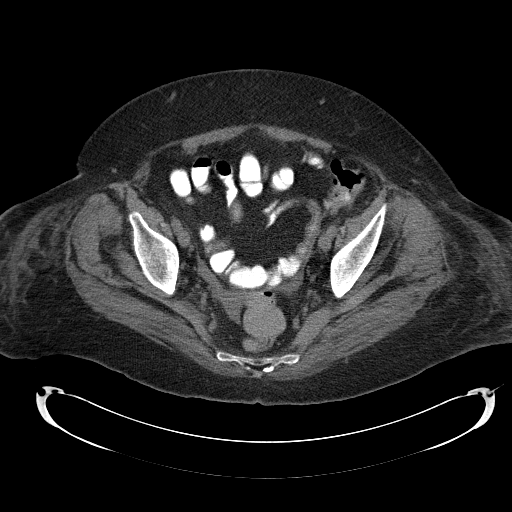
[im 23/88  soft-tissue]
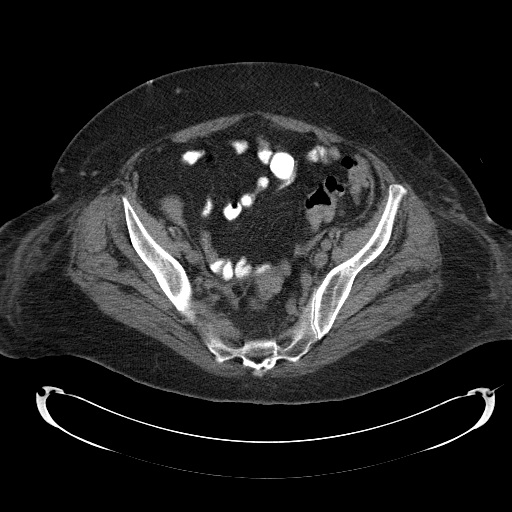
[im 31/88  soft-tissue]
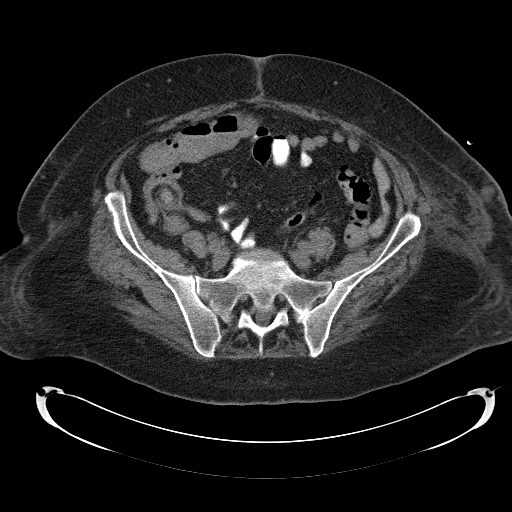
[im 38/88  soft-tissue]
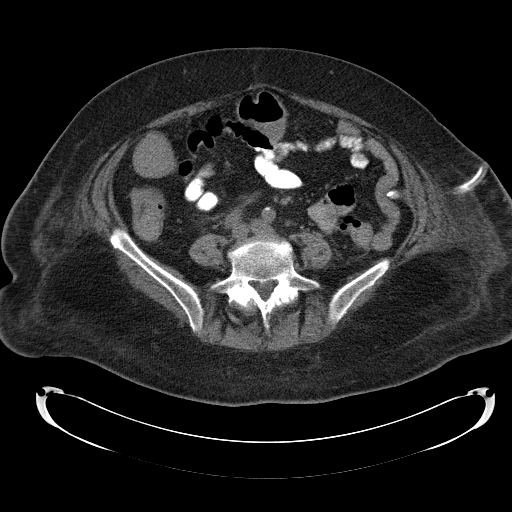
[im 46/88  soft-tissue]
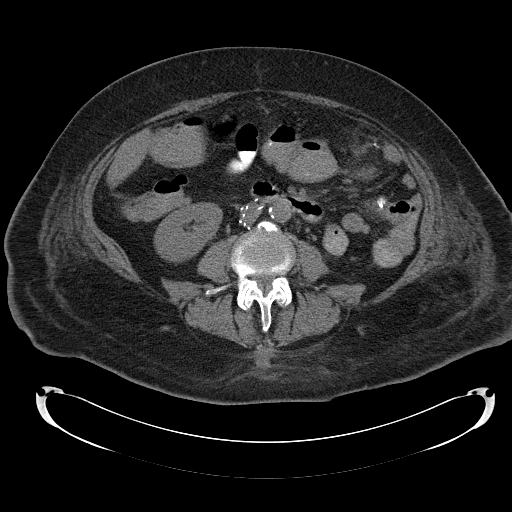
[im 50/88  soft-tissue]
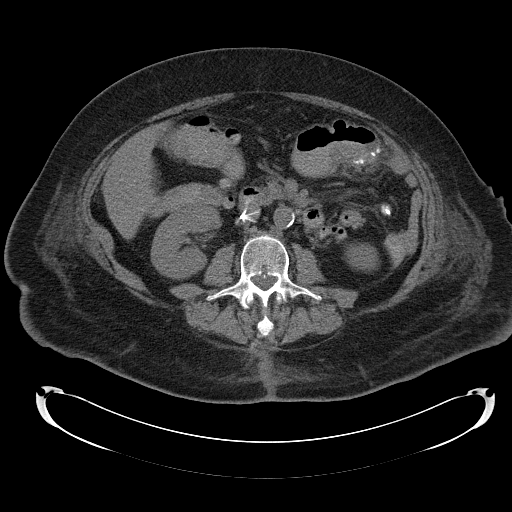
[im 57/88  soft-tissue]
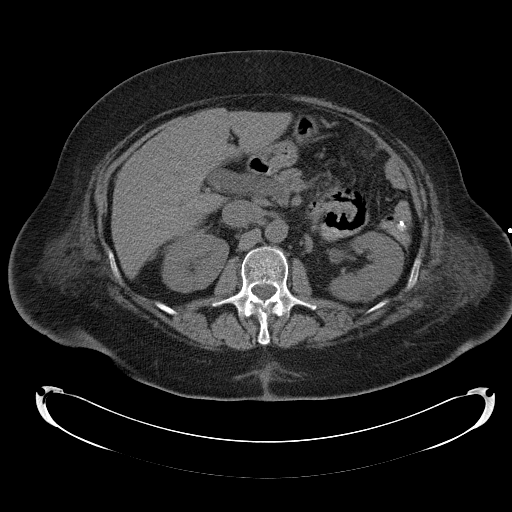
[im 57/88  bone]
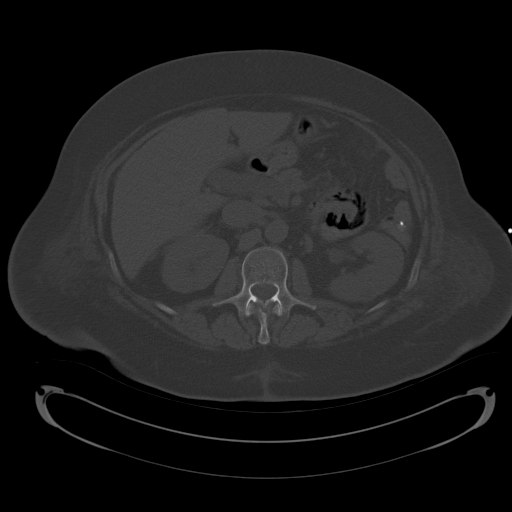
[im 65/88  soft-tissue]
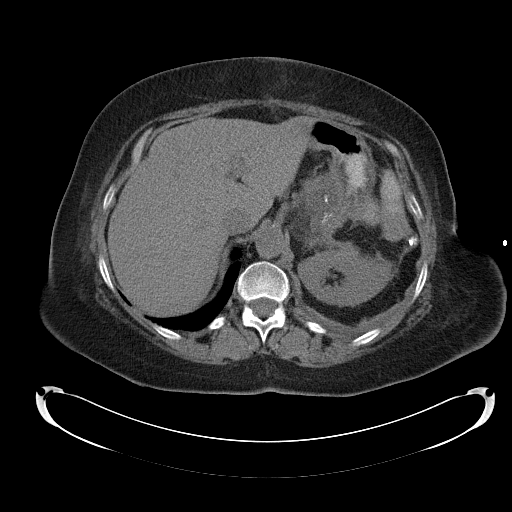
[im 69/88  soft-tissue]
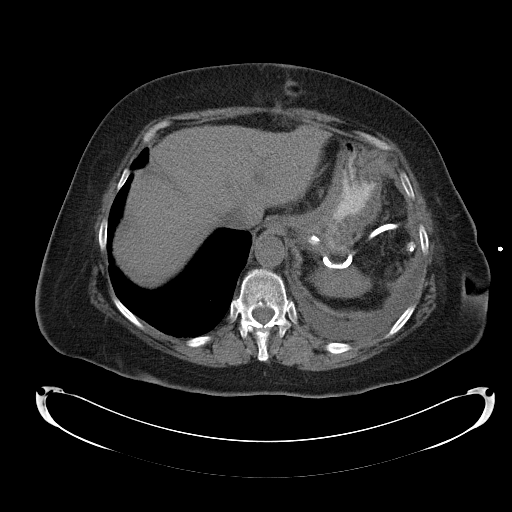
[im 76/88  soft-tissue]
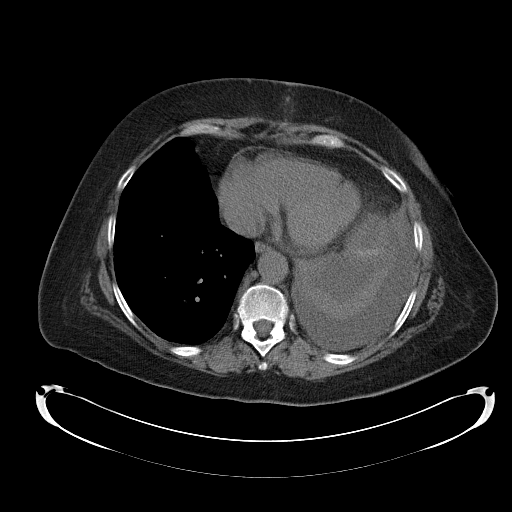
[im 84/88  soft-tissue]
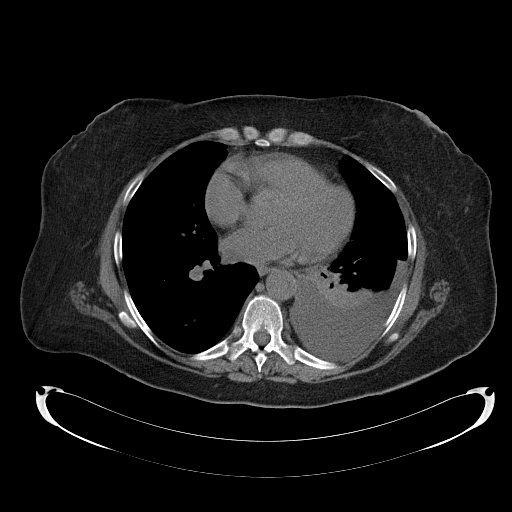

[Series 602: <mpr thick range> · coronal · 0.87mm/px · 3 of 170 slices shown]
[im 57/170  soft-tissue]
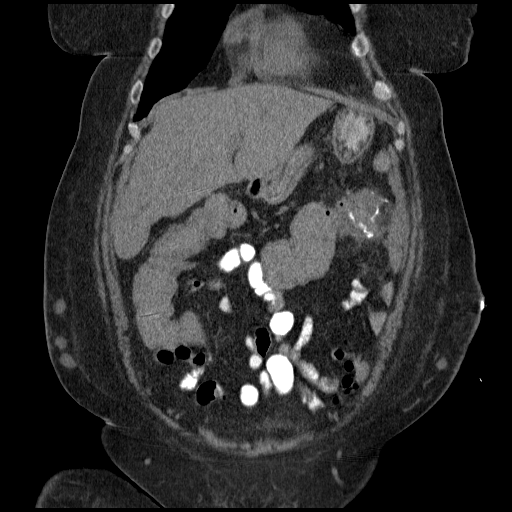
[im 76/170  soft-tissue]
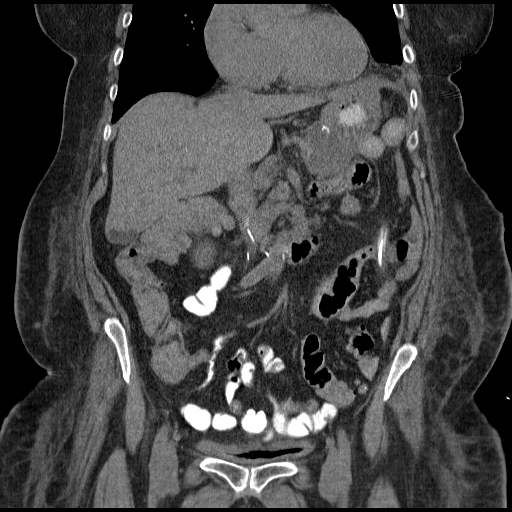
[im 94/170  soft-tissue]
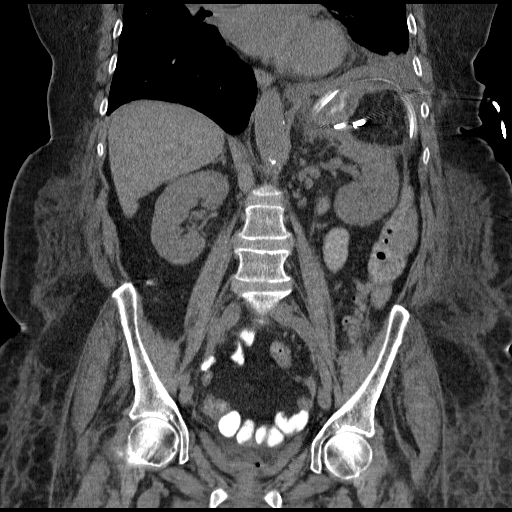

[16 of 46 positions shown; findings below may reference images not displayed]

FINDINGS: Sagittal images of the spine shows diffuse osteopenia. Mild
degenerative changes lumbar spine. There is moderate left pleural
effusion with left lower lobe atelectasis.

Again noted status post cholecystectomy. Unenhanced liver is
unremarkable. The patient is status post splenectomy and distal
pancreatectomy. A percutaneous drainage catheter is noted in left
flank with tip in left upper quadrant anterior to upper pole of the
left kidney. There is significant improvement from prior exam. The
previous subdiaphragmatic fluid has resolved. The collection
anterior to left kidney has resolved. Stable postsurgical changes at
the site of distal pancreatectomy. Small residual seroma at this
level measures 2.8 cm stable in size in appearance from prior exam.
A cyst in midpole posterior aspect of the left kidney is stable.
Postsurgical drain in left lower abdomen is stable. No new low
abscess is noted in left abdomen. Only tiny amount of residual
fluid/air is noted just anterior to the surgical drain in left upper
abdomen axial image 14 measures about 2.4 cm . IVC filter in place
again noted. There is no small bowel obstruction. No ascites or free
air. Normal appendix partially visualized. Mild anasarca
infiltration subcutaneous fat bilateral flank wall. Again noted
subcutaneous round density consistent with subcutaneous injections
lower anterior abdominal wall.

No nephrolithiasis. No hydronephrosis or hydroureter. No distal
colonic obstruction. There is a Foley catheter within a decompressed
urinary bladder. Air is noted within anterior aspect of the bladder.
IMPRESSION: 1. The patient is status post splenectomy and distal pancreatectomy.
A percutaneous drainage catheter is noted in left flank with tip in
left upper quadrant anterior to upper pole of the left kidney. There
is significant improvement from prior exam. The previous
subdiaphragmatic fluid has resolved. The collection anterior to left
kidney has resolved.
2. There is moderate left pleural effusion with left lower lobe
atelectasis.
3. No new abscess collection is noted. Only tiny amount of residual
fluid/air is noted just anterior to the surgical drain in left upper
abdomen axial image 14 measures about 2.4 cm
4. Stable postsurgical changes at the site of distal pancreatectomy.
5. IVC filter in place again noted.
6. No small bowel or colonic obstruction.
7. There is Foley catheter [REDACTED]ompressed urinary bladder. Moderate
air is noted anterior aspect of the bladder probable post
instrumentation.
